# Patient Record
Sex: Male | Born: 1975 | Race: White | Hispanic: No | Marital: Single | Smoking: Never smoker
Health system: Southern US, Community
[De-identification: ages and names within clinical notes are randomized; demographics above are authoritative.]

## PROBLEM LIST (undated history)

## (undated) DIAGNOSIS — K7460 Unspecified cirrhosis of liver: Secondary | ICD-10-CM

## (undated) DIAGNOSIS — Z9889 Other specified postprocedural states: Secondary | ICD-10-CM

## (undated) DIAGNOSIS — I10 Essential (primary) hypertension: Secondary | ICD-10-CM

## (undated) DIAGNOSIS — N182 Chronic kidney disease, stage 2 (mild): Secondary | ICD-10-CM

## (undated) DIAGNOSIS — R519 Headache, unspecified: Secondary | ICD-10-CM

## (undated) DIAGNOSIS — G4733 Obstructive sleep apnea (adult) (pediatric): Secondary | ICD-10-CM

## (undated) DIAGNOSIS — K219 Gastro-esophageal reflux disease without esophagitis: Secondary | ICD-10-CM

## (undated) DIAGNOSIS — G629 Polyneuropathy, unspecified: Secondary | ICD-10-CM

## (undated) HISTORY — DX: Chronic kidney disease, stage 2 (mild): N18.2

## (undated) HISTORY — DX: Other specified postprocedural states: Z98.890

## (undated) HISTORY — DX: Obstructive sleep apnea (adult) (pediatric): G47.33

---

## 2019-08-17 ENCOUNTER — Encounter: Payer: Self-pay | Admitting: Nurse Practitioner

## 2019-08-21 ENCOUNTER — Encounter: Payer: Self-pay | Admitting: Internal Medicine

## 2019-08-25 ENCOUNTER — Other Ambulatory Visit: Payer: Self-pay

## 2019-08-25 ENCOUNTER — Emergency Department (HOSPITAL_COMMUNITY)
Admission: EM | Admit: 2019-08-25 | Discharge: 2019-08-25 | Disposition: A | Discharge status: Home / Self Care | Payer: Medicaid Other | Attending: Emergency Medicine | Admitting: Emergency Medicine

## 2019-08-25 ENCOUNTER — Encounter (HOSPITAL_COMMUNITY): Payer: Self-pay | Admitting: Emergency Medicine

## 2019-08-25 ENCOUNTER — Emergency Department (HOSPITAL_COMMUNITY): Payer: Medicaid Other

## 2019-08-25 DIAGNOSIS — R609 Edema, unspecified: Secondary | ICD-10-CM

## 2019-08-25 DIAGNOSIS — R140 Abdominal distension (gaseous): Secondary | ICD-10-CM | POA: Diagnosis present

## 2019-08-25 DIAGNOSIS — R188 Other ascites: Secondary | ICD-10-CM | POA: Diagnosis not present

## 2019-08-25 DIAGNOSIS — U071 COVID-19: Secondary | ICD-10-CM | POA: Diagnosis not present

## 2019-08-25 DIAGNOSIS — I10 Essential (primary) hypertension: Secondary | ICD-10-CM | POA: Diagnosis not present

## 2019-08-25 DIAGNOSIS — R2243 Localized swelling, mass and lump, lower limb, bilateral: Secondary | ICD-10-CM | POA: Insufficient documentation

## 2019-08-25 DIAGNOSIS — Z79899 Other long term (current) drug therapy: Secondary | ICD-10-CM | POA: Insufficient documentation

## 2019-08-25 HISTORY — DX: Essential (primary) hypertension: I10

## 2019-08-25 HISTORY — DX: Polyneuropathy, unspecified: G62.9

## 2019-08-25 LAB — BRAIN NATRIURETIC PEPTIDE: B Natriuretic Peptide: 44 pg/mL (ref 0.0–100.0)

## 2019-08-25 LAB — PROTIME-INR
INR: 1.2 (ref 0.8–1.2)
Prothrombin Time: 15.3 seconds — ABNORMAL HIGH (ref 11.4–15.2)

## 2019-08-25 LAB — URINALYSIS, ROUTINE W REFLEX MICROSCOPIC
Bilirubin Urine: NEGATIVE
Glucose, UA: NEGATIVE mg/dL
Hgb urine dipstick: NEGATIVE
Ketones, ur: NEGATIVE mg/dL
Leukocytes,Ua: NEGATIVE
Nitrite: NEGATIVE
Protein, ur: NEGATIVE mg/dL
Specific Gravity, Urine: 1.013 (ref 1.005–1.030)
pH: 5 (ref 5.0–8.0)

## 2019-08-25 LAB — CBC WITH DIFFERENTIAL/PLATELET
Abs Immature Granulocytes: 0.01 10*3/uL (ref 0.00–0.07)
Basophils Absolute: 0 10*3/uL (ref 0.0–0.1)
Basophils Relative: 0 %
Eosinophils Absolute: 0.1 10*3/uL (ref 0.0–0.5)
Eosinophils Relative: 1 %
HCT: 38.4 % — ABNORMAL LOW (ref 39.0–52.0)
Hemoglobin: 12.4 g/dL — ABNORMAL LOW (ref 13.0–17.0)
Immature Granulocytes: 0 %
Lymphocytes Relative: 9 %
Lymphs Abs: 0.6 10*3/uL — ABNORMAL LOW (ref 0.7–4.0)
MCH: 29.7 pg (ref 26.0–34.0)
MCHC: 32.3 g/dL (ref 30.0–36.0)
MCV: 91.9 fL (ref 80.0–100.0)
Monocytes Absolute: 0.5 10*3/uL (ref 0.1–1.0)
Monocytes Relative: 7 %
Neutro Abs: 6.1 10*3/uL (ref 1.7–7.7)
Neutrophils Relative %: 83 %
Platelets: 185 10*3/uL (ref 150–400)
RBC: 4.18 MIL/uL — ABNORMAL LOW (ref 4.22–5.81)
RDW: 13.1 % (ref 11.5–15.5)
WBC: 7.3 10*3/uL (ref 4.0–10.5)
nRBC: 0 % (ref 0.0–0.2)

## 2019-08-25 LAB — RESPIRATORY PANEL BY RT PCR (FLU A&B, COVID)
Influenza A by PCR: NEGATIVE
Influenza B by PCR: NEGATIVE
SARS Coronavirus 2 by RT PCR: POSITIVE — AB

## 2019-08-25 LAB — COMPREHENSIVE METABOLIC PANEL
ALT: 19 U/L (ref 0–44)
AST: 27 U/L (ref 15–41)
Albumin: 2.7 g/dL — ABNORMAL LOW (ref 3.5–5.0)
Alkaline Phosphatase: 89 U/L (ref 38–126)
Anion gap: 9 (ref 5–15)
BUN: 18 mg/dL (ref 6–20)
CO2: 24 mmol/L (ref 22–32)
Calcium: 8.1 mg/dL — ABNORMAL LOW (ref 8.9–10.3)
Chloride: 103 mmol/L (ref 98–111)
Creatinine, Ser: 0.84 mg/dL (ref 0.61–1.24)
GFR calc Af Amer: >60 mL/min (ref >60)
GFR calc non Af Amer: >60 mL/min (ref >60)
Glucose, Bld: 90 mg/dL (ref 70–99)
Potassium: 3.4 mmol/L — ABNORMAL LOW (ref 3.5–5.1)
Sodium: 136 mmol/L (ref 135–145)
Total Bilirubin: 0.7 mg/dL (ref 0.3–1.2)
Total Protein: 7.5 g/dL (ref 6.5–8.1)

## 2019-08-25 LAB — TROPONIN I (HIGH SENSITIVITY)
Troponin I (High Sensitivity): <2 ng/L (ref <18)
Troponin I (High Sensitivity): 2 ng/L (ref <18)

## 2019-08-25 NOTE — ED Provider Notes (Signed)
Emergency Department Provider Note   I have reviewed the triage vital signs and the nursing notes.   HISTORY  Chief Complaint Leg Swelling   HPI Myson Levi is a 44 y.o. male with PMH of HTN, Neuropathy, and chronic LE edema with question of newly diagnosed liver cirrhosis presents to the ED worsening edema and abdominal distension.  Patient has had his Lasix increased to 40 mg 3 times daily over the past week with no significant improvement.  He feels swelling both in his legs and in his abdomen.  He feels short of breath mainly with ambulation.  He is able to walk to his mailbox and back but is winded.  He denies chest pain with activity or at rest.  He is not experiencing fevers or chills.  He was at [facility] ED approximately 2 weeks ago and had a work-up including chest x-ray and CT scan of the abdomen and pelvis.  He is unsure of the exact results but believes he was told something about cirrhosis of the liver.  He does not drink alcohol or use drugs.  He was apparently referred to a gastroenterologist by his PCP but has not heard back regarding scheduling an appointment. No fever or chills. Denies abdominal pain.    Past Medical History:  Diagnosis Date  . Hypertension   . Neuropathy     There are no problems to display for this patient.   History reviewed. No pertinent surgical history.  Allergies Patient has no allergy information on record.  History reviewed. No pertinent family history.  Social History Social History   Tobacco Use  . Smoking status: Never Smoker  . Smokeless tobacco: Never Used  Substance Use Topics  . Alcohol use: Never  . Drug use: Never    Review of Systems  Constitutional: No fever/chills Eyes: No visual changes. ENT: No sore throat. Cardiovascular: Denies chest pain. Respiratory: Positive shortness of breath with exertion.  Gastrointestinal: Denies abdominal pain.  No nausea, no vomiting.  No diarrhea.  No constipation.  Genitourinary: Negative for dysuria. Musculoskeletal: Negative for back pain. Positive bilateral LE swelling.  Skin: Negative for rash. Neurological: Negative for headaches, focal weakness or numbness.  10-point ROS otherwise negative.  ____________________________________________   PHYSICAL EXAM:  VITAL SIGNS: ED Triage Vitals [08/25/19 1147]  Enc Vitals Group     BP (!) 145/106     Pulse Rate 88     Resp 18     Temp 98 F (36.7 C)     Temp Source Oral     SpO2 98 %     Weight 295 lb (133.8 kg)     Height 5' 8"  (1.727 m)   Constitutional: Alert and oriented. Well appearing and in no acute distress. Eyes: Conjunctivae are normal.  Head: Atraumatic. Nose: No congestion/rhinnorhea. Mouth/Throat: Mucous membranes are moist.   Neck: No stridor.  Cardiovascular: Normal rate, regular rhythm. Good peripheral circulation. Grossly normal heart sounds.   Respiratory: Normal respiratory effort.  No retractions. Lungs CTAB. Gastrointestinal: Soft and nontender. Positive distention with fluid wave.  Musculoskeletal: No lower extremity tenderness but 3+ pitting edema noted.  Neurologic:  Normal speech and language. No gross focal neurologic deficits are appreciated.  Skin:  Skin is warm, dry and intact. No rash noted.  ____________________________________________   LABS (all labs ordered are listed, but only abnormal results are displayed)  Labs Reviewed  COMPREHENSIVE METABOLIC PANEL - Abnormal; Notable for the following components:      Result Value  Potassium 3.4 (*)    Calcium 8.1 (*)    Albumin 2.7 (*)    All other components within normal limits  CBC WITH DIFFERENTIAL/PLATELET - Abnormal; Notable for the following components:   RBC 4.18 (*)    Hemoglobin 12.4 (*)    HCT 38.4 (*)    Lymphs Abs 0.6 (*)    All other components within normal limits  PROTIME-INR - Abnormal; Notable for the following components:   Prothrombin Time 15.3 (*)    All other components within  normal limits  RESPIRATORY PANEL BY RT PCR (FLU A&B, COVID)  BRAIN NATRIURETIC PEPTIDE  URINALYSIS, ROUTINE W REFLEX MICROSCOPIC  TROPONIN I (HIGH SENSITIVITY)  TROPONIN I (HIGH SENSITIVITY)   ____________________________________________  EKG   EKG Interpretation  Date/Time:  Monday August 25 2019 12:51:15 EDT Ventricular Rate:  74 PR Interval:    QRS Duration: 98 QT Interval:  457 QTC Calculation: 508 R Axis:   44 Text Interpretation: Sinus rhythm Ventricular premature complex Short PR interval Low voltage, precordial leads Left ventricular hypertrophy Borderline T abnormalities, anterior leads Prolonged QT interval No STEMI Confirmed by Nanda Quinton [phone]) on 08/25/2019 12:55:22 PM       ____________________________________________  RADIOLOGY  DG Chest Portable 1 View  Result Date: 08/25/2019 CLINICAL DATA:  Shortness of breath. Abdominal swelling and leg swelling. EXAM: PORTABLE CHEST 1 VIEW COMPARISON:  08/17/2019 FINDINGS: The heart size is within normal limits. Slight pulmonary vascular prominence. Minimal atelectasis at the left lung base. No pulmonary edema or visible pleural effusions. No bone abnormality. IMPRESSION: Slight pulmonary vascular prominence. Electronically Signed   By: Lorriane Shire M.D.   On: 08/25/2019 13:12    ____________________________________________   PROCEDURES  Procedure(s) performed:   Procedures  None  ____________________________________________   INITIAL IMPRESSION / ASSESSMENT AND PLAN / ED COURSE  Pertinent labs & imaging results that were available during my care of the patient were reviewed by me and considered in my medical decision making (see chart for details).   Patient presents emergency department with bilateral lower extremity edema along with abdominal distention.  No abdominal pain, fever, concern for spontaneous bacterial peritonitis.  Patient with likely liver cirrhosis based on his evaluation here with large  volume ascites and report of outpatient CT showing this diagnosis.  He follows primarily with the health department and has been referred to gastroenterology but has not had an appointment as of yet.  I am attempting to obtain the results of the CT scan and emergency department visit from [facility] with the patient consent.  We will obtain x-ray of the chest and follow lab work.  Informal bedside ultrasound in the emergency department shows large volume ascites by my exam.   Labs and imaging not consistent with anasarca or significant volume overload. Most of the patient's symptoms seem to be from ascites. No hypoxemia or respiratory distress. I have provided contact info for local GI and PCP has sent referral. Will need GI w/u and outpatient LVP. Discussed with patient and provided plan in writing. Patient feeling well and comfortable with the plan at discharge.  ____________________________________________  FINAL CLINICAL IMPRESSION(S) / ED DIAGNOSES  Final diagnoses:  Other ascites  Peripheral edema    Note:  This document was prepared using Dragon voice recognition software and may include unintentional dictation errors.  Nanda Quinton, MD, [facility] Emergency Medicine    , [facility], MD 08/25/19 [phone]

## 2019-08-25 NOTE — ED Triage Notes (Signed)
Pt reports abdominal swelling, leg swelling. Pt family member reports pt is currently on three fluid pills but reports recent weight gain, back pain.

## 2019-08-25 NOTE — Discharge Instructions (Signed)
You were seen in the emergency room today with abdominal ascites.  This is a fluid collection in your abdomen likely related to whatever is going on with your liver.  Your primary care doctor has placed a referral to gastroenterology.  I have listed the 2 area practices here in [place].  Please call today to see if they have received a referral and can schedule the next available appointment.  Ultimately, this fluid will likely need to be drained from your abdomen.  Either your primary care doctor or gastroenterologist can help with scheduling what is called a (Large Volume Paracentesis).  Because you have not had this done before they would likely want to send off some of the fluid for testing.  If you develop worsening shortness of breath, chest pain, fever, or abdominal pain you should return to the emergency department immediately for evaluation.

## 2019-08-26 ENCOUNTER — Telehealth (HOSPITAL_COMMUNITY): Payer: Self-pay | Admitting: Nurse Practitioner

## 2019-08-26 DIAGNOSIS — U071 COVID-19: Secondary | ICD-10-CM

## 2019-08-26 NOTE — ED Notes (Signed)
Notified pt of positive covid result.  Instructed to quarantine minimum of 10 days and symptom  Improvement.  Instructed to return to ER as needed.

## 2019-08-26 NOTE — Telephone Encounter (Signed)
Called to discuss with Alejandro Porter about Covid symptoms and the use of bamlanivimab, a monoclonal antibody infusion for those with mild to moderate Covid symptoms and at a high risk of hospitalization.     Pt is qualified for this infusion at the [facility] infusion center due to co-morbid conditions and/or a member of an at-risk group, however declines infusion at this time. Symptoms tier reviewed as well as criteria for ending isolation.  Symptoms reviewed that would warrant ED/Hospital evaluation. Preventative practices reviewed. Patient verbalized understanding. Patient advised to call back if he decides that he does want to get infusion. Callback number to the infusion center given. Patient advised to go to Urgent care or ED with severe symptoms. Last date he would be eligible for infusion is 09/01/19. Symptomatic of shortness of breath.   There are no problems to display for this patient. BMI 44.85 kg/m2  Alejandro Rutter, DNP, [facility] [email or web address] [phone] ([place])

## 2019-09-03 ENCOUNTER — Ambulatory Visit: Payer: Self-pay | Admitting: Nurse Practitioner

## 2019-09-10 ENCOUNTER — Encounter: Payer: Self-pay | Admitting: Nurse Practitioner

## 2019-09-10 ENCOUNTER — Inpatient Hospital Stay (HOSPITAL_COMMUNITY)
Admission: EM | Admit: 2019-09-10 | Discharge: 2019-09-22 | DRG: 441 | Disposition: A | Discharge status: Home / Self Care | Payer: Medicaid Other | Source: Ambulatory Visit | Attending: Family Medicine | Admitting: Family Medicine

## 2019-09-10 ENCOUNTER — Inpatient Hospital Stay (HOSPITAL_COMMUNITY): Payer: Medicaid Other

## 2019-09-10 ENCOUNTER — Emergency Department (HOSPITAL_COMMUNITY): Payer: Medicaid Other

## 2019-09-10 ENCOUNTER — Other Ambulatory Visit: Payer: Self-pay

## 2019-09-10 ENCOUNTER — Ambulatory Visit (INDEPENDENT_AMBULATORY_CARE_PROVIDER_SITE_OTHER): Payer: Medicaid Other | Admitting: Nurse Practitioner

## 2019-09-10 ENCOUNTER — Encounter (HOSPITAL_COMMUNITY): Payer: Self-pay

## 2019-09-10 DIAGNOSIS — K317 Polyp of stomach and duodenum: Secondary | ICD-10-CM | POA: Diagnosis present

## 2019-09-10 DIAGNOSIS — K644 Residual hemorrhoidal skin tags: Secondary | ICD-10-CM | POA: Diagnosis present

## 2019-09-10 DIAGNOSIS — K449 Diaphragmatic hernia without obstruction or gangrene: Secondary | ICD-10-CM | POA: Diagnosis present

## 2019-09-10 DIAGNOSIS — Z6841 Body Mass Index (BMI) 40.0 and over, adult: Secondary | ICD-10-CM | POA: Diagnosis not present

## 2019-09-10 DIAGNOSIS — R601 Generalized edema: Secondary | ICD-10-CM | POA: Insufficient documentation

## 2019-09-10 DIAGNOSIS — E8770 Fluid overload, unspecified: Secondary | ICD-10-CM | POA: Diagnosis present

## 2019-09-10 DIAGNOSIS — I10 Essential (primary) hypertension: Secondary | ICD-10-CM | POA: Diagnosis present

## 2019-09-10 DIAGNOSIS — K2100 Gastro-esophageal reflux disease with esophagitis, without bleeding: Secondary | ICD-10-CM | POA: Diagnosis present

## 2019-09-10 DIAGNOSIS — K7581 Nonalcoholic steatohepatitis (NASH): Principal | ICD-10-CM | POA: Diagnosis present

## 2019-09-10 DIAGNOSIS — Z8616 Personal history of COVID-19: Secondary | ICD-10-CM

## 2019-09-10 DIAGNOSIS — K621 Rectal polyp: Secondary | ICD-10-CM | POA: Diagnosis not present

## 2019-09-10 DIAGNOSIS — I8511 Secondary esophageal varices with bleeding: Secondary | ICD-10-CM | POA: Diagnosis present

## 2019-09-10 DIAGNOSIS — D638 Anemia in other chronic diseases classified elsewhere: Secondary | ICD-10-CM | POA: Diagnosis present

## 2019-09-10 DIAGNOSIS — K7460 Unspecified cirrhosis of liver: Secondary | ICD-10-CM

## 2019-09-10 DIAGNOSIS — K3189 Other diseases of stomach and duodenum: Secondary | ICD-10-CM | POA: Diagnosis present

## 2019-09-10 DIAGNOSIS — R64 Cachexia: Secondary | ICD-10-CM | POA: Diagnosis present

## 2019-09-10 DIAGNOSIS — D696 Thrombocytopenia, unspecified: Secondary | ICD-10-CM | POA: Diagnosis present

## 2019-09-10 DIAGNOSIS — K648 Other hemorrhoids: Secondary | ICD-10-CM | POA: Diagnosis present

## 2019-09-10 DIAGNOSIS — R188 Other ascites: Secondary | ICD-10-CM | POA: Diagnosis present

## 2019-09-10 DIAGNOSIS — K2210 Ulcer of esophagus without bleeding: Secondary | ICD-10-CM | POA: Diagnosis present

## 2019-09-10 DIAGNOSIS — E669 Obesity, unspecified: Secondary | ICD-10-CM | POA: Diagnosis present

## 2019-09-10 DIAGNOSIS — K259 Gastric ulcer, unspecified as acute or chronic, without hemorrhage or perforation: Secondary | ICD-10-CM | POA: Diagnosis present

## 2019-09-10 DIAGNOSIS — K5730 Diverticulosis of large intestine without perforation or abscess without bleeding: Secondary | ICD-10-CM | POA: Diagnosis present

## 2019-09-10 DIAGNOSIS — I81 Portal vein thrombosis: Secondary | ICD-10-CM

## 2019-09-10 DIAGNOSIS — R609 Edema, unspecified: Secondary | ICD-10-CM | POA: Insufficient documentation

## 2019-09-10 DIAGNOSIS — R0600 Dyspnea, unspecified: Secondary | ICD-10-CM | POA: Diagnosis not present

## 2019-09-10 DIAGNOSIS — K7200 Acute and subacute hepatic failure without coma: Secondary | ICD-10-CM | POA: Diagnosis not present

## 2019-09-10 DIAGNOSIS — K222 Esophageal obstruction: Secondary | ICD-10-CM | POA: Diagnosis present

## 2019-09-10 DIAGNOSIS — Z20822 Contact with and (suspected) exposure to covid-19: Secondary | ICD-10-CM | POA: Diagnosis present

## 2019-09-10 DIAGNOSIS — K635 Polyp of colon: Secondary | ICD-10-CM | POA: Diagnosis not present

## 2019-09-10 DIAGNOSIS — G629 Polyneuropathy, unspecified: Secondary | ICD-10-CM | POA: Diagnosis present

## 2019-09-10 DIAGNOSIS — K766 Portal hypertension: Secondary | ICD-10-CM | POA: Insufficient documentation

## 2019-09-10 DIAGNOSIS — Z79899 Other long term (current) drug therapy: Secondary | ICD-10-CM

## 2019-09-10 HISTORY — DX: Unspecified cirrhosis of liver: K74.60

## 2019-09-10 LAB — HEPATITIS PANEL, ACUTE
HCV Ab: NONREACTIVE
Hep A IgM: NONREACTIVE
Hep B C IgM: NONREACTIVE
Hepatitis B Surface Ag: NONREACTIVE

## 2019-09-10 LAB — HEPATITIS B SURFACE ANTIGEN: Hepatitis B Surface Ag: NONREACTIVE

## 2019-09-10 LAB — COMPREHENSIVE METABOLIC PANEL
ALT: 17 U/L (ref 0–44)
AST: 23 U/L (ref 15–41)
Albumin: 2.6 g/dL — ABNORMAL LOW (ref 3.5–5.0)
Alkaline Phosphatase: 80 U/L (ref 38–126)
Anion gap: 8 (ref 5–15)
BUN: 13 mg/dL (ref 6–20)
CO2: 27 mmol/L (ref 22–32)
Calcium: 8.3 mg/dL — ABNORMAL LOW (ref 8.9–10.3)
Chloride: 102 mmol/L (ref 98–111)
Creatinine, Ser: 0.72 mg/dL (ref 0.61–1.24)
GFR calc Af Amer: >60 mL/min (ref >60)
GFR calc non Af Amer: >60 mL/min (ref >60)
Glucose, Bld: 103 mg/dL — ABNORMAL HIGH (ref 70–99)
Potassium: 3.6 mmol/L (ref 3.5–5.1)
Sodium: 137 mmol/L (ref 135–145)
Total Bilirubin: 0.7 mg/dL (ref 0.3–1.2)
Total Protein: 7.4 g/dL (ref 6.5–8.1)

## 2019-09-10 LAB — LACTATE DEHYDROGENASE, PLEURAL OR PERITONEAL FLUID: LD, Fluid: 71 U/L — ABNORMAL HIGH (ref 3–23)

## 2019-09-10 LAB — URINALYSIS, COMPLETE (UACMP) WITH MICROSCOPIC
Bacteria, UA: NONE SEEN
Bilirubin Urine: NEGATIVE
Glucose, UA: NEGATIVE mg/dL
Hgb urine dipstick: NEGATIVE
Ketones, ur: NEGATIVE mg/dL
Leukocytes,Ua: NEGATIVE
Nitrite: NEGATIVE
Protein, ur: NEGATIVE mg/dL
Specific Gravity, Urine: 1.012 (ref 1.005–1.030)
pH: 9 — ABNORMAL HIGH (ref 5.0–8.0)

## 2019-09-10 LAB — PROTEIN / CREATININE RATIO, URINE
Creatinine, Urine: 82.66 mg/dL
Protein Creatinine Ratio: 0.13 mg/mg{Cre} (ref 0.00–0.15)
Total Protein, Urine: 11 mg/dL

## 2019-09-10 LAB — ECHOCARDIOGRAM COMPLETE
Height: 69 in
Weight: 5040 oz

## 2019-09-10 LAB — SARS CORONAVIRUS 2 (TAT 6-24 HRS): SARS Coronavirus 2: NEGATIVE

## 2019-09-10 LAB — CBC WITH DIFFERENTIAL/PLATELET
Abs Immature Granulocytes: 0.02 10*3/uL (ref 0.00–0.07)
Basophils Absolute: 0 10*3/uL (ref 0.0–0.1)
Basophils Relative: 0 %
Eosinophils Absolute: 0.2 10*3/uL (ref 0.0–0.5)
Eosinophils Relative: 3 %
HCT: 37.5 % — ABNORMAL LOW (ref 39.0–52.0)
Hemoglobin: 12.1 g/dL — ABNORMAL LOW (ref 13.0–17.0)
Immature Granulocytes: 0 %
Lymphocytes Relative: 8 %
Lymphs Abs: 0.6 10*3/uL — ABNORMAL LOW (ref 0.7–4.0)
MCH: 29.7 pg (ref 26.0–34.0)
MCHC: 32.3 g/dL (ref 30.0–36.0)
MCV: 92.1 fL (ref 80.0–100.0)
Monocytes Absolute: 0.7 10*3/uL (ref 0.1–1.0)
Monocytes Relative: 10 %
Neutro Abs: 5.3 10*3/uL (ref 1.7–7.7)
Neutrophils Relative %: 79 %
Platelets: 111 10*3/uL — ABNORMAL LOW (ref 150–400)
RBC: 4.07 MIL/uL — ABNORMAL LOW (ref 4.22–5.81)
RDW: 14.2 % (ref 11.5–15.5)
WBC: 6.7 10*3/uL (ref 4.0–10.5)
nRBC: 0 % (ref 0.0–0.2)

## 2019-09-10 LAB — BODY FLUID CELL COUNT WITH DIFFERENTIAL
Lymphs, Fluid: 69 %
Monocyte-Macrophage-Serous Fluid: 29 % — ABNORMAL LOW (ref 50–90)
Neutrophil Count, Fluid: 2 % (ref 0–25)
Total Nucleated Cell Count, Fluid: 215 cu mm (ref 0–1000)

## 2019-09-10 LAB — PROTIME-INR
INR: 1.2 (ref 0.8–1.2)
INR: 1.2 (ref 0.8–1.2)
Prothrombin Time: 15.1 seconds (ref 11.4–15.2)
Prothrombin Time: 15.2 seconds (ref 11.4–15.2)

## 2019-09-10 LAB — GLUCOSE, PLEURAL OR PERITONEAL FLUID: Glucose, Fluid: 116 mg/dL

## 2019-09-10 LAB — RAPID URINE DRUG SCREEN, HOSP PERFORMED
Amphetamines: NOT DETECTED
Barbiturates: NOT DETECTED
Benzodiazepines: NOT DETECTED
Cocaine: NOT DETECTED
Opiates: NOT DETECTED
Tetrahydrocannabinol: NOT DETECTED

## 2019-09-10 LAB — PROTEIN, PLEURAL OR PERITONEAL FLUID: Total protein, fluid: <3 g/dL

## 2019-09-10 LAB — HIV ANTIBODY (ROUTINE TESTING W REFLEX): HIV Screen 4th Generation wRfx: NONREACTIVE

## 2019-09-10 LAB — AMMONIA: Ammonia: 27 umol/L (ref 9–35)

## 2019-09-10 LAB — GRAM STAIN: Gram Stain: NONE SEEN

## 2019-09-10 LAB — HEPATITIS C ANTIBODY: HCV Ab: NONREACTIVE

## 2019-09-10 LAB — ALBUMIN, PLEURAL OR PERITONEAL FLUID: Albumin, Fluid: 0.9 g/dL

## 2019-09-10 LAB — FERRITIN: Ferritin: 193 ng/mL (ref 24–336)

## 2019-09-10 LAB — ACETAMINOPHEN LEVEL: Acetaminophen (Tylenol), Serum: <10 ug/mL — ABNORMAL LOW (ref 10–30)

## 2019-09-10 MED ORDER — LIDOCAINE HCL (PF) 1 % IJ SOLN
5.0000 mL | Freq: Once | INTRAMUSCULAR | Status: DC
  Filled 2019-09-10: qty 30

## 2019-09-10 MED ORDER — FERROUS SULFATE 325 (65 FE) MG PO TABS
325.0000 mg | ORAL_TABLET | Freq: Every day | ORAL | Status: DC
  Administered 2019-09-10: 325 mg via ORAL
  Filled 2019-09-10 (×2): qty 1

## 2019-09-10 MED ORDER — FUROSEMIDE 10 MG/ML IJ SOLN
40.0000 mg | INTRAMUSCULAR | Status: AC
  Administered 2019-09-10: 40 mg via INTRAVENOUS
  Filled 2019-09-10: qty 4

## 2019-09-10 MED ORDER — METOPROLOL SUCCINATE ER 50 MG PO TB24
50.0000 mg | ORAL_TABLET | Freq: Every day | ORAL | Status: DC
  Administered 2019-09-10 – 2019-09-12 (×3): 50 mg via ORAL
  Filled 2019-09-10 (×4): qty 1

## 2019-09-10 MED ORDER — GABAPENTIN 300 MG PO CAPS
300.0000 mg | ORAL_CAPSULE | Freq: Every day | ORAL | Status: DC
  Administered 2019-09-10 – 2019-09-21 (×12): 300 mg via ORAL
  Filled 2019-09-10 (×12): qty 1

## 2019-09-10 MED ORDER — ALBUMIN HUMAN 25 % IV SOLN
50.0000 g | Freq: Once | INTRAVENOUS | Status: AC
  Administered 2019-09-10: 50 g via INTRAVENOUS
  Filled 2019-09-10: qty 200

## 2019-09-10 MED ORDER — FUROSEMIDE 10 MG/ML IJ SOLN
60.0000 mg | Freq: Two times a day (BID) | INTRAMUSCULAR | Status: DC
  Administered 2019-09-11: 60 mg via INTRAVENOUS
  Filled 2019-09-10: qty 6

## 2019-09-10 NOTE — Patient Instructions (Signed)
Your health issues we discussed today were:   Cirrhosis with a lot of swelling: 1. I am sorry you are not doing well! 2. I Alejandro Porter have you proceed to the emergency department for likely admission to the hospital.  I have called the emergency department and notified them that I am sending you 3. While you were there we can hopefully get your fluid under control, get some of the swelling up through your hips improved, and complete labs and imaging to try to work on why you have developed cirrhosis 4. Further recommendations will follow 5. Our practice will see you while you are in the hospital  Overall I recommend:  1. Continue your other current medications 2. Return for follow-up based on recommendations made at the hospital 3. Call us if you have any questions or concerns   ---------------------------------------------------------------  COVID-19 Vaccine Information can be found at: [email or web address].uk For questions related to vaccine distribution or appointments, please email [email or web address] or call [phone].   ---------------------------------------------------------------   At [facility] we value your feedback. You may receive a survey about your visit today. Please share your experience as we strive to create trusting relationships with our patients to provide genuine, compassionate, quality care.  We appreciate your understanding and patience as we review any laboratory studies, imaging, and other diagnostic tests that are ordered as we care for you. Our office policy is 5 business days for review of these results, and any emergent or urgent results are addressed in a timely manner for your best interest. If you do not hear from our office in 1 week, please contact us.   We also encourage the use of MyChart, which contains your medical information for your review as well. If you are not enrolled in  this feature, an access code is on this after visit summary for your convenience. Thank you for allowing [place] to be involved in your care.  It was great to see you today!  I hope you have a great Summer!!

## 2019-09-10 NOTE — Progress Notes (Signed)
*  PRELIMINARY RESULTS* Echocardiogram 2D Echocardiogram has been performed.  Leavy Cella 09/10/2019, 4:38 PM

## 2019-09-10 NOTE — Assessment & Plan Note (Signed)
Newly diagnosed with cirrhosis on CT imaging at [facility] about 3 weeks ago.  Imaging as per HPI but essentially found cirrhosis with portal hypertension and large amount of ascites.  He has never been diagnosed with cirrhosis before.  Query etiology.  He denies drug use, any alcohol.  He does occasionally/intermittently take somewhat large doses of Tylenol up to "6 or 7 pills a day" (unknown strength 250 mg versus 500 mg).  Drug-induced injury is a possibility.  About 4 weeks ago he began having significant swelling in his lower extremities and across his abdomen that is gotten progressively worse.  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  We will plan to work-up his liver for possible causes including standard CBC, BMP, HFP, INR, AFP, ferritin, ceruloplasmin, alpha-1 antitrypsin, ANA, AMA, ASMA.  Further recommendations for ascites as per below.  Given his newly diagnosed cirrhosis, severe abdominal distention and tense ascites, pitting edema up to his hips bilaterally with likely anasarca I have referred him to the emergency department for admission and diuresis.  I have contacted ultrasound to discuss need for paracentesis.  Follow-up based on post hospital recommendations.

## 2019-09-10 NOTE — Assessment & Plan Note (Signed)
Likely portal hypertension based on splenomegaly and ascites on CT imaging.  His platelet count does remain normal, although lower end of normal.  He will likely need endoscopic screening for esophageal varices at some point in near future.  Denies any nausea or vomiting at this point, no hematochezia or melena.  Continue to monitor.  Follow-up based on post hospital recommendations.

## 2019-09-10 NOTE — ED Provider Notes (Signed)
[facility] Provider Note   CSN: [id number] Arrival date & time: 09/10/19  4008     History Chief Complaint  Patient presents with  . Ascites    Alejandro Porter is a 44 y.o. male.  HPI   This patient is a 44 year old male, history of hypertension, history of recently diagnosed cirrhosis, states he has never had any alcohol, he does not smoke cigarettes, he has never had hepatitis to the best of his knowledge.  He was recently diagnosed with cirrhosis, he was seen at an outside hospital and started on some diuretic medications however over the last month he has had progressive swelling and when he followed up with gastroenterology today he was severely edematous with a massively distended abdomen, tachypnea and massive bilateral lower extremity edema.  This patient reports that he can no longer lay down to sleep, he sleeps in a recliner sitting up, he was sent over from the GI office for paracentesis and diuresis.  This is persistent, severe, worsening over time  Past Medical History:  Diagnosis Date  . Hypertension   . Liver cirrhosis ([place])   . Neuropathy     Patient Active Problem List   Diagnosis Date Noted  . Hepatic cirrhosis ([place]) 09/10/2019  . Edema 09/10/2019  . Portal hypertension ([place]) 09/10/2019    History reviewed. No pertinent surgical history.     No family history on file.  Social History   Tobacco Use  . Smoking status: Never Smoker  . Smokeless tobacco: Never Used  Substance Use Topics  . Alcohol use: Never  . Drug use: Never    Home Medications Prior to Admission medications   Medication Sig Start Date End Date Taking? Authorizing Provider  Cholecalciferol (VITAMIN D3) 125 MCG (5000 UT) TABS Take 1 tablet by mouth daily. 06/17/19   [provider]  Cholecalciferol 1.25 MG (50000 UT) TABS Take 1 tablet by mouth daily.  07/31/19   [provider]  Ferrous Sulfate (IRON) 325 (65 Fe) MG TABS Take 1 tablet by mouth  daily.    [provider]  furosemide (LASIX) 40 MG tablet Take 40 mg by mouth 2 (two) times daily as needed for fluid.    [provider]  gabapentin (NEURONTIN) 300 MG capsule Take 300 mg by mouth at bedtime.  08/19/19   [provider]  metoprolol succinate (TOPROL-XL) 50 MG 24 hr tablet Take 50 mg by mouth daily. Take with or immediately following a meal.    [provider]  Multiple Vitamin (MULTIVITAMIN) tablet Take 1 tablet by mouth daily.    [provider]  Potassium 99 MG TABS Take 1 tablet by mouth daily.    [provider]    Allergies    Patient has no allergy information on record.  Review of Systems   Review of Systems  All other systems reviewed and are negative.   Physical Exam Updated Vital Signs Temp 98.8 F (37.1 C) (Oral)   Ht 1.753 m (5' 9" )   Wt (!) 142.9 kg   BMI 46.52 kg/m   Physical Exam Vitals and nursing note reviewed.  Constitutional:      General: He is in acute distress.     Appearance: He is well-developed. He is ill-appearing.  HENT:     Head: Normocephalic and atraumatic.     Mouth/Throat:     Pharynx: No oropharyngeal exudate.  Eyes:     General: No scleral icterus.  Right eye: No discharge.        Left eye: No discharge.     Conjunctiva/sclera: Conjunctivae normal.     Pupils: Pupils are equal, round, and reactive to light.  Neck:     Thyroid: No thyromegaly.     Vascular: No JVD.  Cardiovascular:     Rate and Rhythm: Normal rate and regular rhythm.     Heart sounds: Normal heart sounds. No murmur. No friction rub. No gallop.   Pulmonary:     Effort: Respiratory distress present.     Breath sounds: Wheezing and rales present.     Comments: Subtle rales right greater than left at the bases, tachypneic, audible expiratory wheezing Abdominal:     General: Bowel sounds are normal. There is distension.     Palpations: Abdomen is soft. There is no mass.     Tenderness: There is  no abdominal tenderness.     Comments: Massively distended edematous anasarca abdomen, fluid wave present, no tabetic sounds to percussion  Musculoskeletal:        General: No tenderness. Normal range of motion.     Cervical back: Normal range of motion and neck supple.     Right lower leg: Edema present.     Left lower leg: Edema present.     Comments: 3-4+ pitting edema bilaterally symmetrical from the feet all the way through the thighs  Lymphadenopathy:     Cervical: No cervical adenopathy.  Skin:    General: Skin is warm and dry.     Findings: No erythema or rash.  Neurological:     Mental Status: He is alert.     Coordination: Coordination normal.  Psychiatric:        Behavior: Behavior normal.     ED Results / Procedures / Treatments   Labs (all labs ordered are listed, but only abnormal results are displayed) Labs Reviewed  SARS CORONAVIRUS 2 (TAT 6-24 HRS)  BODY FLUID CULTURE  COMPREHENSIVE METABOLIC PANEL  AMMONIA  CBC WITH DIFFERENTIAL/PLATELET  PROTIME-INR  LACTATE DEHYDROGENASE, PLEURAL OR PERITONEAL FLUID  GLUCOSE, PLEURAL OR PERITONEAL FLUID  PROTEIN, PLEURAL OR PERITONEAL FLUID  ALBUMIN, PLEURAL OR PERITONEAL FLUID  BODY FLUID CELL COUNT WITH DIFFERENTIAL    EKG EKG Interpretation  Date/Time:  Wednesday September 10 2019 10:18:11 EDT Ventricular Rate:  94 PR Interval:    QRS Duration: 78 QT Interval:  468 QTC Calculation: 586 R Axis:   56 Text Interpretation: Sinus rhythm Low voltage, precordial leads Nonspecific T abnormalities, diffuse leads Prolonged QT interval Nonspecific T wave abnormality since last tracing no significant change Confirmed by Noemi Chapel [phone]) on 09/10/2019 10:48:13 AM   Radiology DG Chest Port 1 View  Result Date: 09/10/2019 CLINICAL DATA:  Shortness of breath. EXAM: PORTABLE CHEST 1 VIEW COMPARISON:  August 25, 2019. FINDINGS: Stable cardiomediastinal silhouette. No pneumothorax is noted. Increased left basilar atelectasis or  infiltrate is noted. Minimal right basilar subsegmental atelectasis or infiltrate is noted. Bony thorax is unremarkable. IMPRESSION: Increased bibasilar subsegmental atelectasis or infiltrates, left greater than right. Electronically Signed   By: Marijo Conception M.D.   On: 09/10/2019 10:14    Procedures .Paracentesis  Date/Time: 09/10/2019 10:47 AM Performed by: Noemi Chapel, MD Authorized by: Noemi Chapel, MD   Consent:    Consent obtained:  Written   Consent given by:  Patient   Risks discussed:  Bleeding, bowel perforation, infection and pain   Alternatives discussed:  No treatment and delayed treatment Pre-procedure details:  Procedure purpose:  Therapeutic   Preparation: Patient was prepped and draped in usual sterile fashion   Anesthesia (see MAR for exact dosages):    Anesthesia method:  Local infiltration   Local anesthetic:  Lidocaine 1% w/o epi Procedure details:    Needle gauge:  18   Ultrasound guidance: yes     Puncture site:  L lower quadrant   Fluid removed amount:  8000   Fluid appearance:  Amber   Dressing:  4x4 sterile gauze and adhesive bandage Post-procedure details:    Patient tolerance of procedure:  Tolerated well, no immediate complications Comments:         (including critical care time)  Medications Ordered in ED Medications  furosemide (LASIX) injection 40 mg (has no administration in time range)  lidocaine (PF) (XYLOCAINE) 1 % injection 5 mL (has no administration in time range)    ED Course  I have reviewed the triage vital signs and the nursing notes.  Pertinent labs & imaging results that were available during my care of the patient were reviewed by me and considered in my medical decision making (see chart for details).    MDM Rules/Calculators/A&P                      This patient appears somewhat chronically ill, he is somewhat cachectic around the face but diffusely edematous with anasarca extending up to the costal  margin.   This patient presents to the ED for concern of swelling and edema as well as ascites, this involves an extensive number of treatment options, and is a complaint that carries with it a high risk of complications and morbidity.  The differential diagnosis includes liver failure, electrolyte disturbance, primary pulmonary process   Lab Tests:   I Ordered, reviewed, and interpreted labs, which included CMP, CBC, ammonia, hepatitis panel  Medicines ordered:   I ordered medication Lasix for edema  Imaging Studies ordered:   I ordered imaging studies which included chest x-ray and  I independently visualized and interpreted imaging which showed atelectasis or infiltrates in the base left greater than right  Additional history obtained:   Additional history obtained from medical record  Previous records obtained and reviewed   Consultations Obtained:   I consulted hospitalist and discussed lab and imaging findings, Dr. Carles Collet will see the patient for admission  Reevaluation:  After the interventions stated above, I reevaluated the patient and found improved  Critical Interventions:  . Paracentesis for massive ascites, Lasix for fluid overload, the patient will need to be admitted to the hospital . Discussed with the hospitalist for evaluation and admission   Final Clinical Impression(s) / ED Diagnoses Final diagnoses:  Acute liver failure without hepatic coma  Cirrhosis of liver with ascites, unspecified hepatic cirrhosis type ([place])  Anasarca  Other ascites      Noemi Chapel, MD 09/10/19 1147

## 2019-09-10 NOTE — Assessment & Plan Note (Signed)
Significant edema bilaterally lower extremities and ascites.  His abdomen is quite distended today with tense ascites.  Associated respiratory distress and significant dyspnea even at rest.  Bilateral lower extremity edema despite Lasix 40 mg twice daily with 3+ pitting edema in the feet, ankles, calves progressing up to 1+ pitting edema all the way to at least his bilateral hips.  Likely significant volume overload.  He did note that he has not been peeing very often, however his normal creatinine in the emergency room 3 weeks ago is reassuring.  Likely chronic cirrhosis previously undiagnosed now tipped into a decompensated state, possibly due to nonocclusive portal venous thrombus.  His weight is up about 50 to 60 pounds in the past months.  Given his respiratory issues, need for paracentesis (and inability to withdraw more than 4 L at his first fluid tap) with need for significant diuresis for significant lower extremity edema bordering on anasarca I will refer him to the emergency department for likely admission.  I have entered outpatient paracentesis orders with labs and notified ultrasound.  This will need to be changed to inpatient but I wanted to ensure labs were ordered with his paracentesis including Gram stain, culture, cytology, cell count, albumin (to calculate SAAG).  Further recommendations to follow including posthospitalization follow-up.

## 2019-09-10 NOTE — Progress Notes (Signed)
Cc'ed to pcp °

## 2019-09-10 NOTE — ED Triage Notes (Addendum)
Pt diagnosed with liver cirrhosis 4 weeks ago. Today he was sent by GI NP to have a paracentesis. Extreme ascites on abdomen as well as bilateral leg swelling. Pt has SOB with rest and exertion. Pt has been taking 73m Lasix daily, but has not taken it today. Pt has gained 65 lbs in the last 4 weeks.

## 2019-09-10 NOTE — H&P (Signed)
History and Physical  Cindy Brindisi AES:[id number] DOB: 19-Jun-1975 DOA: 09/10/2019   PCP: [facility] [place]   Patient coming from: Home  Chief Complaint: swelling  HPI:  Alejandro Porter is a 44 y.o. male with medical history of hypertension and recently diagnosed liver cirrhosis presenting with worsening lower extremity edema and increasing abdominal girth.  The patient states that he has been struggling with leg edema and abdominal edema for at least 2 months.  He had been going to the health department and [facility] and he has been receiving furosemide 40 mg twice daily since February 2021.  Unfortunately, he continued to have dyspnea on exertion and worsening edema.  He initially went to [facility] emergency department.  CT of the abdomen and pelvis was performed at that time it showed cirrhosis with portal hypertension with splenomegaly and a large amount of ascites.  There is nonocclusive thrombus in his portal vein.  In addition there was prominent to mildly enlarged periceliac and gastrohepatic lymph nodes.  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  The patient was instructed to follow-up at the [place].  Unfortunately, he continued to have edema and swelling.  He presented to the emergency department at [facility] on 08/25/2019.  He was told to increase his furosemide to 40 mg 3 times daily.  He continued to have dyspnea on exertion and orthopnea type symptoms to the point where he has had to sleep in the recliner.  He saw gastroenterology on the morning of 09/10/2019.  Because of concerns for decompensated liver cirrhosis and continued edema and shortness of breath, the patient was sent to the emergency department for further evaluation.  The patient himself denies any fevers, chills, chest pain, nausea, vomiting, diarrhea, abdominal pain, dysuria, hematuria.  In the emergency department, the patient was afebrile and  hemodynamically stable with oxygen saturation 96% on room air.  His x-ray showed bibasilar atelectasis, left greater than right.  Assessment/Plan: Decompensated liver cirrhosis -Paracentesis in the emergency department removed 8 L -Start IV furosemide -Give albumin -Patient denies any history of alcohol -Check alpha-fetoprotein, INR, ferritin, ceruloplasmin, alpha-1 antitrypsin, ANA, antimitochondrial antibody, anti-smooth muscle antibody -Hepatitis B surface antigen -Hep C antibody -echo -urine protein/creatinine ratio -GI consult  Essential hypertension -Continue metoprolol succinate  Portal venous Thrombus -defer to GI whether to anticoagulate      Past Medical History:  Diagnosis Date  . Hypertension   . Liver cirrhosis ([place])   . Neuropathy    History reviewed. No pertinent surgical history. Social History:  reports that he has never smoked. He has never used smokeless tobacco. He reports that he does not drink alcohol or use drugs.   Family History  Problem Relation Age of Onset  . Liver disease Neg Hx        Prior to Admission medications   Medication Sig Start Date End Date Taking? Authorizing Provider  Cholecalciferol (VITAMIN D3) 125 MCG (5000 UT) TABS Take 1 tablet by mouth daily. 06/17/19  Yes [provider]  Ferrous Sulfate (IRON) 325 (65 Fe) MG TABS Take 1 tablet by mouth daily.   Yes [provider]  furosemide (LASIX) 40 MG tablet Take 40 mg by mouth 2 (two) times daily as needed for fluid.   Yes [provider]  gabapentin (NEURONTIN) 300 MG capsule Take 300 mg by mouth at bedtime.  08/19/19  Yes [provider]  metoprolol succinate (TOPROL-XL) 50 MG 24  hr tablet Take 50 mg by mouth daily. Take with or immediately following a meal.   Yes [provider]  Multiple Vitamin (MULTIVITAMIN) tablet Take 1 tablet by mouth daily.   Yes [provider]  Potassium 99 MG TABS Take 1 tablet by mouth daily.    Yes [provider]    Review of Systems:  Constitutional:  No weight loss, night sweats, Fevers, chills, fatigue.  Head&Eyes: No headache.  No vision loss.  No eye pain or scotoma ENT:  No Difficulty swallowing,Tooth/dental problems,Sore throat,  No ear ache, post nasal drip,  Cardio-vascular:  No chest pain, Orthopnea, PND, swelling in lower extremities,  dizziness, palpitations  GI:  No  abdominal pain, nausea, vomiting, diarrhea, loss of appetite, hematochezia, melena, heartburn, indigestion, Resp:  No shortness of breath with exertion or at rest. No cough. No coughing up of blood .No wheezing.No chest wall deformity  Skin:  no rash or lesions.  GU:  no dysuria, change in color of urine, no urgency or frequency. No flank pain.  Musculoskeletal:  No decreased range of motion. No back pain.  Psych:  No change in mood or affect. No depression or anxiety. Neurologic: No headache, no dysesthesia, no focal weakness, no vision loss. No syncope  Physical Exam: Vitals:   09/10/19 0949 09/10/19 0952 09/10/19 1100 09/10/19 1200  BP:   (!) 136/91 130/88  Pulse:   91 93  Resp:   19 (!) 27  Temp:  98.8 F (37.1 C)    TempSrc:  Oral    SpO2:   97% 97%  Weight: (!) 142.9 kg     Height: 5' 9"  (1.753 m)      General:  A&O x 3, NAD, nontoxic, pleasant/cooperative Head/Eye: No conjunctival hemorrhage, no icterus, [place]/AT, No nystagmus ENT:  No icterus,  No thrush, good dentition, no pharyngeal exudate Neck:  No masses, no lymphadenpathy, no bruits CV:  RRR, no rub, no gallop, no S3 Lung:  Bibasilar crackles. No wheeze Abdomen: soft/NT, +BS, nondistended, no peritoneal signs Ext: No cyanosis, No rashes, No petechiae, No lymphangitis, 4+LE edema Neuro: CNII-XII intact, strength 4/5 in bilateral upper and lower extremities, no dysmetria  Labs on Admission:  Basic Metabolic Panel: Recent Labs  Lab 09/10/19 1025  NA 137  K 3.6  CL 102  CO2 27  GLUCOSE 103*  BUN 13    CREATININE 0.72  CALCIUM 8.3*   Liver Function Tests: Recent Labs  Lab 09/10/19 1025  AST 23  ALT 17  ALKPHOS 80  BILITOT 0.7  PROT 7.4  ALBUMIN 2.6*   No results for input(s): LIPASE, AMYLASE in the last 168 hours. Recent Labs  Lab 09/10/19 1040  AMMONIA 27   CBC: Recent Labs  Lab 09/10/19 1025  WBC 6.7  NEUTROABS 5.3  HGB 12.1*  HCT 37.5*  MCV 92.1  PLT 111*   Coagulation Profile: Recent Labs  Lab 09/10/19 1025  INR 1.2   Cardiac Enzymes: No results for input(s): CKTOTAL, CKMB, CKMBINDEX, TROPONINI in the last 168 hours. BNP: Invalid input(s): POCBNP CBG: No results for input(s): GLUCAP in the last 168 hours. Urine analysis:    Component Value Date/Time   COLORURINE YELLOW 08/25/2019 [place] 08/25/2019 1451   LABSPEC 1.013 08/25/2019 1451   [place] 5.0 08/25/2019 1451   GLUCOSEU NEGATIVE 08/25/2019 1451   HGBUR NEGATIVE 08/25/2019 1451   BILIRUBINUR NEGATIVE 08/25/2019 1451   [place] 08/25/2019 1451   PROTEINUR NEGATIVE 08/25/2019 1451  NITRITE NEGATIVE 08/25/2019 1451   LEUKOCYTESUR NEGATIVE 08/25/2019 1451   Sepsis Labs: @LABRCNTIP (procalcitonin:4,lacticidven:4) )No results found for this or any previous visit (from the past 240 hour(s)).   Radiological Exams on Admission: DG Chest Port 1 View  Result Date: 09/10/2019 CLINICAL DATA:  Shortness of breath. EXAM: PORTABLE CHEST 1 VIEW COMPARISON:  August 25, 2019. FINDINGS: Stable cardiomediastinal silhouette. No pneumothorax is noted. Increased left basilar atelectasis or infiltrate is noted. Minimal right basilar subsegmental atelectasis or infiltrate is noted. Bony thorax is unremarkable. IMPRESSION: Increased bibasilar subsegmental atelectasis or infiltrates, left greater than right. Electronically Signed   By: Marijo Conception M.D.   On: 09/10/2019 10:14    EKG: Independently reviewed. Sinus, nonspecific T wave changes    Time spent:70 minutes Code Status:    FULL Family Communication:  No Family at bedside Disposition Plan: expect 2-3 day hospitalization Consults called: GI DVT Prophylaxis:   SCDs  Orson Eva, DO  [facility] Pager [phone]  If 7PM-7AM, please contact night-coverage [email or web address] Password [facility] 09/10/2019, 12:09 PM

## 2019-09-10 NOTE — Progress Notes (Signed)
Primary Care Physician:  [facility] Primary Gastroenterologist:  Dr. Gala Romney  Chief Complaint  Patient presents with  . Cirrhosis    ref by PCP  . Edema    abdomen     HPI:   Alejandro Porter is a 44 y.o. male who presents on referral from the health department for cirrhosis.  Reviewed information provided with referral including follow-up COVID-19 test report documenting negative SARS-CoV-2 08/29/2019.  Also reviewed primary care office visit at the [facility] 08/19/2019.  At that time he stated he went to the emergency department at [facility] and was told based on CT scan he has cirrhosis and ascites and needs to see a specialist.  At that time he also noted not urinating very often, noted edema.  Labs at that time include normal white blood cell count 8.2, normal hemoglobin at 13.0, normal platelets at 192.  Creatinine was found to be 0.84.  LFTs normal.  Sodium a little low at 134.  BMP essentially normal, troponins normal.  I was able to pull a copy of his CT result completed at [facility] on 08/17/2019.  Findings include cirrhosis with portal venous hypertension with splenomegaly, large amount of ascites.  Also noted nonocclusive thrombus in the portal vein.  Prominent to mildly enlarged periceliac and gastrohepatic ligament lymph nodes most likely reactive.  Incidentally the patient was in the emergency department 08/25/2019 with a past medical history of hypertension, neuropathy, chronic lower extremity edema and a question of newly diagnosed liver cirrhosis.  He complained of worsening edema and abdominal distention.  Lasix increased to 40 mg 3 times daily over the past week without improvement.  Short of breath even with just walking to his mailbox.  He was at [facility] emergency department about 2 weeks ago with a chest x-ray and CT of the abdomen and pelvis but feels he was told something about cirrhosis of the liver.  No alcohol  or drugs.  No abdominal pain.  Vitals are essentially stable.  On exam noted positive for distention and fluid wave.  Bedside ultrasound demonstrated large volume ascites.  Labs and imaging were not consistent with anasarca or volume overload.  He was discharged satisfactorily with recommendations for GI follow-up.  Labs showed SARS-CoV-2 positive.  CMP with mild hypokalemia at 3.4, completely normal LFTs.  Creatinine normal at 0.84.  BNP was normal.  CBC with mild anemia and hemoglobin at 12.4 which is normocytic and normochromic.  Platelets normal at 185.  INR normal at 1.2.  Today he states he's not been doing well. He began having abdominal swelling about 4 weeks ago; legs started swelling about 4 weeks ago. He has never been told he has cirrhosis until 3 weeks ago at [facility]. He was placed on fluid pills at the [facility]. Has nocturnal abdominal pain (7/10) when he lays down (cannot lay on his back, needs to sit in a recliner). Denies N/V, hematochezia. Has dark stools on iron pills(started 2 weeks ago); was told he is anemia and hypokalemic at the health department. Denies fever, chills. About a month ago he was 240 lbs, today he is 315 lb. Denies yellowing of skin/eyes, darkened urine, acute episodic confusion, generalized pruritis. Denies ETOH, has never drank any alcohol. Denies any history of drugs/IV drugs. Was COVID-19 positive in the ER and negative on 3/16. Denies URI or flu-like symptoms. Denies loss of sense of taste or smell. Denies chest pain, dyspnea, dizziness, lightheadedness, syncope, near  syncope. Denies any other upper or lower GI symptoms.  Notes he was intermittently taking up to 6-7 Tylenol a day with a headache over several years.  Approximately 60-75 minutes was personally spend on this patient's complex care today  Past Medical History:  Diagnosis Date  . Hypertension   . Liver cirrhosis ([place])   . Neuropathy     History reviewed. No pertinent surgical  history.  No current facility-administered medications for this visit.   Current Outpatient Medications  Medication Sig Dispense Refill  . Cholecalciferol (VITAMIN D3) 125 MCG (5000 UT) TABS Take 1 tablet by mouth daily.    . Cholecalciferol 1.25 MG (50000 UT) TABS Take 1 tablet by mouth daily.     . Ferrous Sulfate (IRON) 325 (65 Fe) MG TABS Take 1 tablet by mouth daily.    . furosemide (LASIX) 40 MG tablet Take 40 mg by mouth 2 (two) times daily as needed for fluid.    Marland Kitchen gabapentin (NEURONTIN) 300 MG capsule Take 300 mg by mouth at bedtime.     . metoprolol succinate (TOPROL-XL) 50 MG 24 hr tablet Take 50 mg by mouth daily. Take with or immediately following a meal.    . Multiple Vitamin (MULTIVITAMIN) tablet Take 1 tablet by mouth daily.    . Potassium 99 MG TABS Take 1 tablet by mouth daily.     Facility-Administered Medications Ordered in Other Visits  Medication Dose Route Frequency Provider Last Rate Last Admin  . furosemide (LASIX) injection 40 mg  40 mg Intravenous STAT Noemi Chapel, MD      . lidocaine (PF) (XYLOCAINE) 1 % injection 5 mL  5 mL Intradermal Once Noemi Chapel, MD        Allergies as of 09/10/2019  . (Not on File)    Family History  Problem Relation Age of Onset  . Liver disease Neg Hx     Social History   Socioeconomic History  . Marital status: Single    Spouse name: Not on file  . Number of children: Not on file  . Years of education: Not on file  . Highest education level: Not on file  Occupational History  . Not on file  Tobacco Use  . Smoking status: Never Smoker  . Smokeless tobacco: Never Used  Substance and Sexual Activity  . Alcohol use: Never  . Drug use: Never  . Sexual activity: Not on file  Other Topics Concern  . Not on file  Social History Narrative  . Not on file   Social Determinants of Health   Financial Resource Strain:   . Difficulty of Paying Living Expenses:   Food Insecurity:   . Worried About Sales executive in the Last Year:   . Arboriculturist in the Last Year:   Transportation Needs:   . Film/video editor (Medical):   Marland Kitchen Lack of Transportation (Non-Medical):   Physical Activity:   . Days of Exercise per Week:   . Minutes of Exercise per Session:   Stress:   . Feeling of Stress :   Social Connections:   . Frequency of Communication with Friends and Family:   . Frequency of Social Gatherings with Friends and Family:   . Attends Religious Services:   . Active Member of Clubs or Organizations:   . Attends Archivist Meetings:   Marland Kitchen Marital Status:   Intimate Partner Violence:   . Fear of Current or Ex-Partner:   . Emotionally Abused:   .  Physically Abused:   . Sexually Abused:     Subjective: Review of Systems  Constitutional: Negative for chills, fever, malaise/fatigue and weight loss.       Significant/severe weight gain  HENT: Negative for congestion and sore throat.   Respiratory: Positive for shortness of breath. Negative for cough.   Cardiovascular: Positive for orthopnea, leg swelling and PND. Negative for chest pain and palpitations.  Gastrointestinal: Positive for abdominal pain. Negative for blood in stool, diarrhea, melena (Dark stool on iron), nausea and vomiting.  Musculoskeletal: Negative for joint pain and myalgias.  Skin: Negative for rash.  Neurological: Positive for sensory change (in legs due to edema). Negative for dizziness and weakness.  Endo/Heme/Allergies: Does not bruise/bleed easily.  Psychiatric/Behavioral: Negative for depression. The patient is not nervous/anxious.   All other systems reviewed and are negative.      Objective: BP (!) 149/104   Pulse 97   Temp (!) 97.3 F (36.3 C) (Oral)   Ht 5' 7"  (1.702 m)   Wt (!) 315 lb (142.9 kg)   BMI 49.34 kg/m  Physical Exam Vitals and nursing note reviewed.  Constitutional:      General: He is not in acute distress.    Appearance: Normal appearance. He is obese. He is  ill-appearing. He is not toxic-appearing or diaphoretic.  HENT:     Head: Normocephalic and atraumatic.     Nose: No congestion or rhinorrhea.  Eyes:     General: No scleral icterus. Cardiovascular:     Rate and Rhythm: Normal rate and regular rhythm.     Heart sounds: Normal heart sounds.     Comments: Unable to palpate pedal pulses due to edema. Pitting edema up to 1+ bilateral hips Pulmonary:     Effort: Respiratory distress (significant dyspnea) present.     Breath sounds: Normal breath sounds.  Abdominal:     General: Abdomen is protuberant. Bowel sounds are normal. There is distension (severe distension).     Palpations: There is fluid wave. There is no hepatomegaly, splenomegaly or mass.     Tenderness: There is no abdominal tenderness. There is no guarding or rebound.     Hernia: No hernia is present.     Comments: Severe abdominal distension with tense ascites  Musculoskeletal:     Cervical back: Neck supple.     Right lower leg: 3+ Pitting Edema present.     Left lower leg: 3+ Pitting Edema present.  Skin:    General: Skin is warm and dry.     Coloration: Skin is not jaundiced.     Findings: No bruising or rash.  Neurological:     General: No focal deficit present.     Mental Status: He is alert and oriented to person, place, and time. Mental status is at baseline.  Psychiatric:        Mood and Affect: Mood normal.        Behavior: Behavior normal.        Thought Content: Thought content normal.       09/10/2019 10:52 AM   Disclaimer: This note was dictated with voice recognition software. Similar sounding words can inadvertently be transcribed and may not be corrected upon review.

## 2019-09-10 NOTE — Plan of Care (Signed)
8 liter of clear yellow fluid drained from abdomen. Dressing applied.

## 2019-09-11 ENCOUNTER — Inpatient Hospital Stay (HOSPITAL_COMMUNITY): Payer: Medicaid Other

## 2019-09-11 ENCOUNTER — Encounter (HOSPITAL_COMMUNITY): Payer: Self-pay | Admitting: Internal Medicine

## 2019-09-11 DIAGNOSIS — R601 Generalized edema: Secondary | ICD-10-CM

## 2019-09-11 DIAGNOSIS — R188 Other ascites: Secondary | ICD-10-CM

## 2019-09-11 DIAGNOSIS — K7460 Unspecified cirrhosis of liver: Secondary | ICD-10-CM

## 2019-09-11 DIAGNOSIS — I81 Portal vein thrombosis: Secondary | ICD-10-CM

## 2019-09-11 LAB — COMPREHENSIVE METABOLIC PANEL
ALT: 16 U/L (ref 0–44)
AST: 23 U/L (ref 15–41)
Albumin: 2.6 g/dL — ABNORMAL LOW (ref 3.5–5.0)
Alkaline Phosphatase: 70 U/L (ref 38–126)
Anion gap: 5 (ref 5–15)
BUN: 13 mg/dL (ref 6–20)
CO2: 30 mmol/L (ref 22–32)
Calcium: 8.3 mg/dL — ABNORMAL LOW (ref 8.9–10.3)
Chloride: 104 mmol/L (ref 98–111)
Creatinine, Ser: 0.72 mg/dL (ref 0.61–1.24)
GFR calc Af Amer: >60 mL/min (ref >60)
GFR calc non Af Amer: >60 mL/min (ref >60)
Glucose, Bld: 88 mg/dL (ref 70–99)
Potassium: 4 mmol/L (ref 3.5–5.1)
Sodium: 139 mmol/L (ref 135–145)
Total Bilirubin: 1.3 mg/dL — ABNORMAL HIGH (ref 0.3–1.2)
Total Protein: 6.5 g/dL (ref 6.5–8.1)

## 2019-09-11 LAB — ALPHA-1-ANTITRYPSIN: A-1 Antitrypsin, Ser: 206 mg/dL — ABNORMAL HIGH (ref 101–187)

## 2019-09-11 LAB — CBC
HCT: 38.4 % — ABNORMAL LOW (ref 39.0–52.0)
Hemoglobin: 12.1 g/dL — ABNORMAL LOW (ref 13.0–17.0)
MCH: 29.4 pg (ref 26.0–34.0)
MCHC: 31.5 g/dL (ref 30.0–36.0)
MCV: 93.4 fL (ref 80.0–100.0)
Platelets: 106 10*3/uL — ABNORMAL LOW (ref 150–400)
RBC: 4.11 MIL/uL — ABNORMAL LOW (ref 4.22–5.81)
RDW: 14.3 % (ref 11.5–15.5)
WBC: 5.8 10*3/uL (ref 4.0–10.5)
nRBC: 0 % (ref 0.0–0.2)

## 2019-09-11 LAB — PATHOLOGIST SMEAR REVIEW

## 2019-09-11 LAB — AFP TUMOR MARKER: AFP, Serum, Tumor Marker: 1.3 ng/mL (ref 0.0–8.3)

## 2019-09-11 LAB — ANA W/REFLEX IF POSITIVE: Anti Nuclear Antibody (ANA): NEGATIVE

## 2019-09-11 LAB — CERULOPLASMIN: Ceruloplasmin: 31.4 mg/dL — ABNORMAL HIGH (ref 16.0–31.0)

## 2019-09-11 MED ORDER — ALBUMIN HUMAN 25 % IV SOLN
25.0000 g | Freq: Two times a day (BID) | INTRAVENOUS | Status: AC
  Administered 2019-09-11 – 2019-09-13 (×6): 25 g via INTRAVENOUS
  Filled 2019-09-11 (×6): qty 100

## 2019-09-11 MED ORDER — FUROSEMIDE 10 MG/ML IJ SOLN
40.0000 mg | Freq: Two times a day (BID) | INTRAMUSCULAR | Status: DC
  Administered 2019-09-12 – 2019-09-22 (×20): 40 mg via INTRAVENOUS
  Filled 2019-09-11 (×21): qty 4

## 2019-09-11 MED ORDER — SPIRONOLACTONE 25 MG PO TABS
100.0000 mg | ORAL_TABLET | Freq: Every day | ORAL | Status: DC
  Administered 2019-09-12 – 2019-09-16 (×5): 100 mg via ORAL
  Filled 2019-09-11 (×5): qty 4

## 2019-09-11 MED ORDER — POLYETHYLENE GLYCOL 3350 17 G PO PACK
17.0000 g | PACK | Freq: Every day | ORAL | Status: DC
  Administered 2019-09-12 – 2019-09-22 (×8): 17 g via ORAL
  Filled 2019-09-11 (×9): qty 1

## 2019-09-11 NOTE — Progress Notes (Addendum)
Subjective:  New patient to [facility], seen in the office yesterday for recent diagnosis of cirrhosis/anasarca with associated dyspnea. Abdominal swelling and leg swelling for four + weeks. First found out about cirrhosis 3 weeks ago. Dark stools on iron. Weighed 240 pounds one months ago. 315 pounds yesterday in our office. Denies h/o etoh or drug use. Has been taking 6-7 Tylenol daily for headache off/on (once or twice per month) for several years. Chronically obese. Was 330 pounds a year ago, intentionally lost 90 pounds and then fluid issues started. On 3/14 CT showed nonocclusive portal vein thrombosis.  Objective: Vital signs in last 24 hours: Temp:  [97.9 F (36.6 C)-98.9 F (37.2 C)] 98.9 F (37.2 C) (04/08 0413) Pulse Rate:  [70-104] 85 (04/08 0413) Resp:  [13-27] 13 (04/08 0413) BP: (114-139)/(80-103) 114/80 (04/08 0413) SpO2:  [91 %-97 %] 93 % (04/08 0413) Weight:  [142.9 kg] 142.9 kg (04/07 0949) Last BM Date: 09/09/19 General:   Alert,  Chronically ill appearing male, cooperative in NAD Head:  Normocephalic and atraumatic. Eyes:  Sclera clear, no icterus.  Chest: CTA bilaterally without rales, rhonchi, crackles.    Heart:  Regular rate and rhythm; no murmurs, clicks, rubs,  or gallops. Abdomen:  Soft, nontender and distended but not tense. Normal bowel sounds, without guarding, and without rebound.   Extremities:  Without clubbing, deformity or edema. 3+ edenma bilaterally to the knees. Erythema noted on dorsum of both feet. Neurologic:  Alert and  oriented x4;  grossly normal neurologically. Skin:  Intact without significant lesions. See extremitity exam above for rashes. Psych:  Alert and cooperative. Normal mood and affect.  Intake/Output from previous day: 04/07 0701 - 04/08 0700 In: 240 [P.O.:240] Out: 1450 [Urine:1450] Intake/Output this shift: No intake/output data recorded.  Lab Results: CBC Recent Labs    09/10/19 1025 09/11/19 0535  WBC 6.7 5.8  HGB 12.1*  12.1*  HCT 37.5* 38.4*  MCV 92.1 93.4  PLT 111* 106*   BMET Recent Labs    09/10/19 1025 09/11/19 0535  NA 137 139  K 3.6 4.0  CL 102 104  CO2 27 30  GLUCOSE 103* 88  BUN 13 13  CREATININE 0.72 0.72  CALCIUM 8.3* 8.3*   LFTs Recent Labs    09/10/19 1025 09/11/19 0535  BILITOT 0.7 1.3*  ALKPHOS 80 70  AST 23 23  ALT 17 16  PROT 7.4 6.5  ALBUMIN 2.6* 2.6*   No results for input(s): LIPASE in the last 72 hours. PT/INR Recent Labs    09/10/19 1025 09/10/19 1526  LABPROT 15.1 15.2  INR 1.2 1.2      Imaging Studies: DG Chest Port 1 View  Result Date: 09/10/2019 CLINICAL DATA:  Shortness of breath. EXAM: PORTABLE CHEST 1 VIEW COMPARISON:  August 25, 2019. FINDINGS: Stable cardiomediastinal silhouette. No pneumothorax is noted. Increased left basilar atelectasis or infiltrate is noted. Minimal right basilar subsegmental atelectasis or infiltrate is noted. Bony thorax is unremarkable. IMPRESSION: Increased bibasilar subsegmental atelectasis or infiltrates, left greater than right. Electronically Signed   By: Marijo Conception M.D.   On: 09/10/2019 10:14   DG Chest Portable 1 View  Result Date: 08/25/2019 CLINICAL DATA:  Shortness of breath. Abdominal swelling and leg swelling. EXAM: PORTABLE CHEST 1 VIEW COMPARISON:  08/17/2019 FINDINGS: The heart size is within normal limits. Slight pulmonary vascular prominence. Minimal atelectasis at the left lung base. No pulmonary edema or visible pleural effusions. No bone abnormality. IMPRESSION: Slight pulmonary vascular prominence. Electronically Signed  By: Lorriane Shire M.D.   On: 08/25/2019 13:12   ECHOCARDIOGRAM COMPLETE  Result Date: 09/10/2019    ECHOCARDIOGRAM REPORT   Patient Name:   Alejandro Porter Date of Exam: 09/10/2019 Medical Rec #:  [id number]       Height:       69.0 in Accession #:    [id number]      Weight:       315.0 lb Date of Birth:  08-24-1975        BSA:          2.506 m Patient Age:    44 years        BP:            134/94 mmHg Patient Gender: M               HR:           92 bpm. Exam Location:  [facility] Procedure: 2D Echo Indications:    Dyspnea 786.09 / R06.00  History:        Patient has no prior history of Echocardiogram examinations.                 Risk Factors:Non-Smoker and Hypertension. Edema, Portal vein                 thrombosis, History of COVID 19.  Sonographer:    Leavy Cella RDCS (AE) Referring Phys: [phone] DAVID TAT IMPRESSIONS  1. Left ventricular ejection fraction, by estimation, is 60 to 65%. The left ventricle has normal function. The left ventricle has no regional wall motion abnormalities. Left ventricular diastolic parameters are indeterminate.  2. Right ventricular systolic function is normal. The right ventricular size is normal.  3. The mitral valve was not well visualized. No evidence of mitral valve regurgitation. No evidence of mitral stenosis.  4. The aortic valve was not well visualized. Aortic valve regurgitation is not visualized. No aortic stenosis is present. FINDINGS  Left Ventricle: Left ventricular ejection fraction, by estimation, is 60 to 65%. The left ventricle has normal function. The left ventricle has no regional wall motion abnormalities. The left ventricular internal cavity size was normal in size. There is  no left ventricular hypertrophy. Left ventricular diastolic parameters are indeterminate. Right Ventricle: The right ventricular size is normal. No increase in right ventricular wall thickness. Right ventricular systolic function is normal. Left Atrium: Left atrial size was normal in size. Right Atrium: Right atrial size was normal in size. Pericardium: There is no evidence of pericardial effusion. Mitral Valve: The mitral valve was not well visualized. No evidence of mitral valve regurgitation. No evidence of mitral valve stenosis. Tricuspid Valve: The tricuspid valve is normal in structure. Tricuspid valve regurgitation is not demonstrated. No evidence of tricuspid  stenosis. Aortic Valve: The aortic valve was not well visualized. Aortic valve regurgitation is not visualized. No aortic stenosis is present. Aortic valve mean gradient measures 5.2 mmHg. Aortic valve peak gradient measures 9.3 mmHg. Aortic valve area, by VTI measures 2.81 cm. Pulmonic Valve: The pulmonic valve was not well visualized. Pulmonic valve regurgitation is not visualized. No evidence of pulmonic stenosis. Aorta: The aortic root was not well visualized. Pulmonary Artery: Indeterminate PASP, inadequate TR jet. Venous: The inferior vena cava was not well visualized. IAS/Shunts: The interatrial septum was not well visualized. Additional Comments: Mild ascites is present.  LEFT VENTRICLE PLAX 2D LVIDd:         4.96 cm  Diastology LVIDs:  3.02 cm  LV e' lateral:   14.80 cm/s LV PW:         0.90 cm  LV E/e' lateral: 3.8 LV IVS:        0.86 cm  LV e' medial:    8.16 cm/s LVOT diam:     2.30 cm  LV E/e' medial:  6.9 LV SV:         86 LV SV Index:   34 LVOT Area:     4.15 cm  RIGHT VENTRICLE RV S prime:     12.60 cm/s TAPSE (M-mode): 2.8 cm LEFT ATRIUM             Index LA diam:        4.30 cm 1.72 cm/m LA Vol (A2C):   49.2 ml 19.63 ml/m LA Vol (A4C):   38.5 ml 15.36 ml/m LA Biplane Vol: 47.3 ml 18.87 ml/m  AORTIC VALVE AV Area (Vmax):    2.62 cm AV Area (Vmean):   2.50 cm AV Area (VTI):     2.81 cm AV Vmax:           152.23 cm/s AV Vmean:          107.685 cm/s AV VTI:            0.307 m AV Peak Grad:      9.3 mmHg AV Mean Grad:      5.2 mmHg LVOT Vmax:         95.98 cm/s LVOT Vmean:        64.882 cm/s LVOT VTI:          0.208 m LVOT/AV VTI ratio: 0.68  AORTA Ao Root diam: 3.40 cm MITRAL VALVE MV Area (PHT): 3.81 cm    SHUNTS MV Decel Time: 199 msec    Systemic VTI:  0.21 m MV E velocity: 56.10 cm/s  Systemic Diam: 2.30 cm MV A velocity: 54.80 cm/s MV E/A ratio:  1.02 Carlyle Dolly MD Electronically signed by Carlyle Dolly MD Signature Date/Time: 09/10/2019/4:56:54 PM    Final   [2  weeks]   Assessment: 44 y/o male with recent diagnosis of cirrhosis presenting with anasarca associated with shortness of breath. Has failed outpatient diuretic regimen.   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 No history of etoh use. Viral markers negative. Previously weighed over 330 pounds, with intentional weight loss of 90 pounds trying to get healthy, prior to onset of fluid retention several weeks ago. MELD Na 8. Child Pugh B.  Portal vein thrombosis, nonocclusive, extending into right intrahepatic portal vein, first noted at time of CT 08/16/20.  Ascites/anasarca: s/p LVAP yesterday with no evidence of SBP on fluid cell count. SAAG 1.7.  Plan: 1. F/U pending labs/fluid analysis. 2. Albumin/lasix runs X 6.  3. Daily weights.  4. Consider EGD in the next 24 hours to screen for varices prior to consideration of anticoagulation for recent portal vein thrombosis.  5. NPO until determine timing of EGD. 6. Outpatient liver transplant evaluation.   We would like to thank you for the opportunity to participate in the care of Antoine Vandermeulen.  Laureen Ochs. Bernarda Caffey [facility] [phone] 4/8/202110:18 AM     LOS: 1 day    Addendum: discussed with Dr. Oneida Alar. Needs updated imaging to evaluate portal vein thrombosis. Liver doppler ordered.  Laureen Ochs. Bernarda Caffey [facility] [phone] 4/8/202112:03 PM

## 2019-09-11 NOTE — Progress Notes (Signed)
Patient Demographics:    Alejandro Porter, is a 44 y.o. male, DOB - Nov 02, 1975, TGG:[id number]  Admit date - 09/10/2019   Admitting Physician Orson Eva, MD  Outpatient Primary MD for the patient is [facility]  LOS - 1   Chief Complaint  Patient presents with  . Ascites        Subjective:    Alejandro Porter today has no fevers, no emesis,  No chest pain,   -Sister at bedside, questions answered  Assessment  & Plan :    Active Problems:   Cirrhosis of liver with ascites (HCC)   Portal vein thrombosis    1) decompensated liver cirrhosis suspect NASH------ for hepatitis profile negative, patient denies significant EtOH use -MELD Na 8. Child Pugh B. -s/p paracentesis on 09/10/2019 -Patient failed outpatient diuresis with oral agents, continue IV Lasix -GI consult appreciated  2)Portal vein thrombosis, nonocclusive, extending into right intrahepatic portal vein, first noted at time of CT 08/16/20--- adult from anticoagulation until after EGD to rule out varices that may increase bleeding risk  3)H/o COVID 19 infection--diagnosed 08/25/2019, repeat Covid test negative on 09/10/2019 -Asymptomatic,   4)HTN--stop metoprolol, use propanolol due to possible portal hypertension   Disposition/Need for in-Hospital Stay- patient unable to be discharged at this time due to ---decompensated liver cirrhosis requiring aggressive iv diuresis -Patient From: Home D/C Place: Home Barriers: Not Clinically Stable- --Patient failed outpatient diuresis with oral agents, continue IV Lasix  Code Status : Full code  Family Communication:   (patient is alert, awake and coherent) -Sister at bedside  Consults  :  GI  DVT Prophylaxis  : - SCDs   Lab Results  Component Value Date   PLT 106 (L) 09/11/2019   Inpatient Medications  Scheduled Meds: . [START ON 09/12/2019] furosemide  40 mg Intravenous  BID  . gabapentin  300 mg Oral QHS  . lidocaine (PF)  5 mL Intradermal Once  . metoprolol succinate  50 mg Oral Daily  . polyethylene glycol  17 g Oral Daily  . [START ON 09/12/2019] spironolactone  100 mg Oral Q breakfast   Continuous Infusions: . albumin human 25 g (09/11/19 1732)   PRN Meds:.  Anti-infectives (From admission, onward)   None       Objective:   Vitals:   09/11/19 0413 09/11/19 0942 09/11/19 1007 09/11/19 1334  BP: 114/80  124/85 117/80  Pulse: 85  82 98  Resp: 13   20  Temp: 98.9 F (37.2 C)   97.7 F (36.5 C)  TempSrc: Oral   Oral  SpO2: 93%   100%  Weight:  130.3 kg    Height:        Wt Readings from Last 3 Encounters:  09/11/19 130.3 kg  09/10/19 (!) 142.9 kg  08/25/19 133.8 kg     Intake/Output Summary (Last 24 hours) at 09/11/2019 1913 Last data filed at 09/11/2019 1700 Gross per 24 hour  Intake 802.73 ml  Output 1400 ml  Net -597.27 ml     Physical Exam  Gen:- Awake Alert, no acute distress HEENT:- [place].AT,   Neck-Supple Neck,No JVD,.  Lungs-  CTAB , fair symmetrical air movement CV- S1, S2 normal, regular  Abd-diminished bowel sounds, no significant tenderness,  ascites with fluid thrill noted,    Extremity/Skin:- No  edema, pedal pulses present  Psych-affect is appropriate, oriented x3 Neuro-generalized weakness, no new focal deficits, no tremors   Data Review:   Micro Results Recent Results (from the past 240 hour(s))  Culture, body fluid-bottle     Status: None (Preliminary result)   Collection Time: 09/10/19 10:27 AM   Specimen: Ascitic  Result Value Ref Range Status   Specimen Description ASCITIC  Final   Special Requests BOTTLES DRAWN AEROBIC AND ANAEROBIC 10CC  Final   Culture   Final    NO GROWTH < 24 HOURS Performed at [facility], [address], [place], [place] [address]    Report Status PENDING  Incomplete  Gram stain     Status: None   Collection Time: 09/10/19 10:27 AM   Specimen: Ascitic  Result Value Ref  Range Status   Specimen Description ASCITIC  Final   Special Requests NONE  Final   Gram Stain   Final    NO ORGANISMS SEEN WBC PRESENT, PREDOMINANTLY MONONUCLEAR CYTOSPIN SMEAR Performed at [facility], [address] [place], [place] [address]    Report Status 09/10/2019 FINAL  Final  SARS CORONAVIRUS 2 (TAT 6-24 HRS) Nasopharyngeal Nasopharyngeal Swab     Status: None   Collection Time: 09/10/19 12:09 PM   Specimen: Nasopharyngeal Swab  Result Value Ref Range Status   SARS Coronavirus 2 NEGATIVE NEGATIVE Final    Comment: (NOTE) SARS-CoV-2 target nucleic acids are NOT DETECTED. The SARS-CoV-2 RNA is generally detectable in upper and lower respiratory specimens during the acute phase of infection. Negative results do not preclude SARS-CoV-2 infection, do not rule out co-infections with other pathogens, and should not be used as the sole basis for treatment or other patient management decisions. Negative results must be combined with clinical observations, patient history, and epidemiological information. The expected result is Negative. Fact Sheet for Patients: SugarRoll.be Fact Sheet for Healthcare Providers: https://www.woods-mathews.com/ This test is not yet approved or cleared by the [place] FDA and  has been authorized for detection and/or diagnosis of SARS-CoV-2 by FDA under an Emergency Use Authorization (EUA). This EUA will remain  in effect (meaning this test can be used) for the duration of the COVID-19 declaration under Section 56 4(b)(1) of the Act, 21 U.S.C. section 360bbb-3(b)(1), unless the authorization is terminated or revoked sooner. Performed at [facility], [place] [address], [place], [place] [address]     Radiology Reports DG Chest Disputanta 1 View  Result Date: 09/10/2019 CLINICAL DATA:  Shortness of breath. EXAM: PORTABLE CHEST 1 VIEW COMPARISON:  August 25, 2019. FINDINGS: Stable cardiomediastinal  silhouette. No pneumothorax is noted. Increased left basilar atelectasis or infiltrate is noted. Minimal right basilar subsegmental atelectasis or infiltrate is noted. Bony thorax is unremarkable. IMPRESSION: Increased bibasilar subsegmental atelectasis or infiltrates, left greater than right. Electronically Signed   By: Marijo Conception M.D.   On: 09/10/2019 10:14   DG Chest Portable 1 View  Result Date: 08/25/2019 CLINICAL DATA:  Shortness of breath. Abdominal swelling and leg swelling. EXAM: PORTABLE CHEST 1 VIEW COMPARISON:  08/17/2019 FINDINGS: The heart size is within normal limits. Slight pulmonary vascular prominence. Minimal atelectasis at the left lung base. No pulmonary edema or visible pleural effusions. No bone abnormality. IMPRESSION: Slight pulmonary vascular prominence. Electronically Signed   By: Lorriane Shire M.D.   On: 08/25/2019 13:12   ECHOCARDIOGRAM COMPLETE  Result Date: 09/10/2019    ECHOCARDIOGRAM REPORT  Patient Name:   KIRK BASQUEZ Date of Exam: 09/10/2019 Medical Rec #:  [id number]       Height:       69.0 in Accession #:    [id number]      Weight:       315.0 lb Date of Birth:  08/04/1975        BSA:          2.506 m Patient Age:    18 years        BP:           134/94 mmHg Patient Gender: M               HR:           92 bpm. Exam Location:  [facility] Procedure: 2D Echo Indications:    Dyspnea 786.09 / R06.00  History:        Patient has no prior history of Echocardiogram examinations.                 Risk Factors:Non-Smoker and Hypertension. Edema, Portal vein                 thrombosis, History of COVID 19.  Sonographer:    Leavy Cella RDCS (AE) Referring Phys: [phone] DAVID TAT IMPRESSIONS  1. Left ventricular ejection fraction, by estimation, is 60 to 65%. The left ventricle has normal function. The left ventricle has no regional wall motion abnormalities. Left ventricular diastolic parameters are indeterminate.  2. Right ventricular systolic function is normal. The right  ventricular size is normal.  3. The mitral valve was not well visualized. No evidence of mitral valve regurgitation. No evidence of mitral stenosis.  4. The aortic valve was not well visualized. Aortic valve regurgitation is not visualized. No aortic stenosis is present. FINDINGS  Left Ventricle: Left ventricular ejection fraction, by estimation, is 60 to 65%. The left ventricle has normal function. The left ventricle has no regional wall motion abnormalities. The left ventricular internal cavity size was normal in size. There is  no left ventricular hypertrophy. Left ventricular diastolic parameters are indeterminate. Right Ventricle: The right ventricular size is normal. No increase in right ventricular wall thickness. Right ventricular systolic function is normal. Left Atrium: Left atrial size was normal in size. Right Atrium: Right atrial size was normal in size. Pericardium: There is no evidence of pericardial effusion. Mitral Valve: The mitral valve was not well visualized. No evidence of mitral valve regurgitation. No evidence of mitral valve stenosis. Tricuspid Valve: The tricuspid valve is normal in structure. Tricuspid valve regurgitation is not demonstrated. No evidence of tricuspid stenosis. Aortic Valve: The aortic valve was not well visualized. Aortic valve regurgitation is not visualized. No aortic stenosis is present. Aortic valve mean gradient measures 5.2 mmHg. Aortic valve peak gradient measures 9.3 mmHg. Aortic valve area, by VTI measures 2.81 cm. Pulmonic Valve: The pulmonic valve was not well visualized. Pulmonic valve regurgitation is not visualized. No evidence of pulmonic stenosis. Aorta: The aortic root was not well visualized. Pulmonary Artery: Indeterminate PASP, inadequate TR jet. Venous: The inferior vena cava was not well visualized. IAS/Shunts: The interatrial septum was not well visualized. Additional Comments: Mild ascites is present.  LEFT VENTRICLE PLAX 2D LVIDd:         4.96 cm   Diastology LVIDs:         3.02 cm  LV e' lateral:   14.80 cm/s LV PW:         0.90  cm  LV E/e' lateral: 3.8 LV IVS:        0.86 cm  LV e' medial:    8.16 cm/s LVOT diam:     2.30 cm  LV E/e' medial:  6.9 LV SV:         86 LV SV Index:   34 LVOT Area:     4.15 cm  RIGHT VENTRICLE RV S prime:     12.60 cm/s TAPSE (M-mode): 2.8 cm LEFT ATRIUM             Index LA diam:        4.30 cm 1.72 cm/m LA Vol (A2C):   49.2 ml 19.63 ml/m LA Vol (A4C):   38.5 ml 15.36 ml/m LA Biplane Vol: 47.3 ml 18.87 ml/m  AORTIC VALVE AV Area (Vmax):    2.62 cm AV Area (Vmean):   2.50 cm AV Area (VTI):     2.81 cm AV Vmax:           152.23 cm/s AV Vmean:          107.685 cm/s AV VTI:            0.307 m AV Peak Grad:      9.3 mmHg AV Mean Grad:      5.2 mmHg LVOT Vmax:         95.98 cm/s LVOT Vmean:        64.882 cm/s LVOT VTI:          0.208 m LVOT/AV VTI ratio: 0.68  AORTA Ao Root diam: 3.40 cm MITRAL VALVE MV Area (PHT): 3.81 cm    SHUNTS MV Decel Time: 199 msec    Systemic VTI:  0.21 m MV E velocity: 56.10 cm/s  Systemic Diam: 2.30 cm MV A velocity: 54.80 cm/s MV E/A ratio:  1.02 Carlyle Dolly MD Electronically signed by Carlyle Dolly MD Signature Date/Time: 09/10/2019/4:56:54 PM    Final    US LIVER DOPPLER  Result Date: 09/11/2019 CLINICAL DATA:  Cirrhosis, ascites, nonocclusive portal thrombus by CT EXAM: DUPLEX ULTRASOUND OF LIVER TECHNIQUE: Color and duplex Doppler ultrasound was performed to evaluate the hepatic in-flow and out-flow vessels. COMPARISON:  08/17/2019 CT FINDINGS: Liver: Increased echogenicity with surface nodularity compatible with hepatic cirrhosis. No focal lesion, mass or intrahepatic biliary ductal dilatation. Main Portal Vein size: 2.7 cm Portal Vein Velocities Main Prox:  35 cm/sec Main Mid: 39 cm/sec Main Dist:  33 cm/sec Right: 26 cm/sec Left: 23 cm/sec Hepatic Vein Velocities Right:  40 cm/sec Middle:  27 cm/sec Left:  46 cm/sec IVC: Present and patent with normal respiratory phasicity. Hepatic  Artery Velocity:  37 cm/sec Splenic Vein Velocity:  18 cm/sec Spleen: 18 cm x 5.5 cm x 22 cm with a total volume of 1163 cm^3 (411 cm^3 is upper limit normal) Portal Vein Occlusion/Thrombus: Nonocclusive hypoechoic thrombus along the main portal vein wall posteriorly extending into the right portal vein. This correlates with the CT. Splenic Vein Occlusion/Thrombus: No Ascites: Moderate ascites present. Varices: None IMPRESSION: Nonocclusive portal vein thrombus within the main portal vein extending into the right portal vein. Hepatopetal flow is maintained. Hepatic cirrhosis and ascites.  Associated splenomegaly. Electronically Signed   By: Jerilynn Mages.  Shick M.D.   On: 09/11/2019 16:02     CBC Recent Labs  Lab 09/10/19 1025 09/11/19 0535  WBC 6.7 5.8  HGB 12.1* 12.1*  HCT 37.5* 38.4*  PLT 111* 106*  MCV 92.1 93.4  MCH 29.7 29.4  MCHC 32.3 31.5  RDW 14.2 14.3  LYMPHSABS 0.6*  --   MONOABS 0.7  --   EOSABS 0.2  --   BASOSABS 0.0  --     Chemistries  Recent Labs  Lab 09/10/19 1025 09/11/19 0535  NA 137 139  K 3.6 4.0  CL 102 104  CO2 27 30  GLUCOSE 103* 88  BUN 13 13  CREATININE 0.72 0.72  CALCIUM 8.3* 8.3*  AST 23 23  ALT 17 16  ALKPHOS 80 70  BILITOT 0.7 1.3*   ------------------------------------------------------------------------------------------------------------------ No results for input(s): CHOL, HDL, LDLCALC, TRIG, CHOLHDL, LDLDIRECT in the last 72 hours.  No results found for: HGBA1C ------------------------------------------------------------------------------------------------------------------ No results for input(s): TSH, T4TOTAL, T3FREE, THYROIDAB in the last 72 hours.  Invalid input(s): FREET3 ------------------------------------------------------------------------------------------------------------------ Recent Labs    09/10/19 1526  FERRITIN 193    Coagulation profile Recent Labs  Lab 09/10/19 1025 09/10/19 1526  INR 1.2 1.2    No results for  input(s): DDIMER in the last 72 hours.  Cardiac Enzymes No results for input(s): CKMB, TROPONINI, MYOGLOBIN in the last 168 hours.  Invalid input(s): CK ------------------------------------------------------------------------------------------------------------------    Component Value Date/Time   BNP 44.0 08/25/2019 1329     Beverlyn Mcginness M.D on 09/11/2019 at 7:13 PM  Go to [email or web address] - for contact info  [facility] - Office  [phone]

## 2019-09-11 NOTE — H&P (View-Only) (Signed)
Subjective:  New patient to [facility], seen in the office yesterday for recent diagnosis of cirrhosis/anasarca with associated dyspnea. Abdominal swelling and leg swelling for four + weeks. First found out about cirrhosis 3 weeks ago. Dark stools on iron. Weighed 240 pounds one months ago. 315 pounds yesterday in our office. Denies h/o etoh or drug use. Has been taking 6-7 Tylenol daily for headache off/on (once or twice per month) for several years. Chronically obese. Was 330 pounds a year ago, intentionally lost 90 pounds and then fluid issues started. On 3/14 CT showed nonocclusive portal vein thrombosis.  Objective: Vital signs in last 24 hours: Temp:  [97.9 F (36.6 C)-98.9 F (37.2 C)] 98.9 F (37.2 C) (04/08 0413) Pulse Rate:  [70-104] 85 (04/08 0413) Resp:  [13-27] 13 (04/08 0413) BP: (114-139)/(80-103) 114/80 (04/08 0413) SpO2:  [91 %-97 %] 93 % (04/08 0413) Weight:  [142.9 kg] 142.9 kg (04/07 0949) Last BM Date: 09/09/19 General:   Alert,  Chronically ill appearing male, cooperative in NAD Head:  Normocephalic and atraumatic. Eyes:  Sclera clear, no icterus.  Chest: CTA bilaterally without rales, rhonchi, crackles.    Heart:  Regular rate and rhythm; no murmurs, clicks, rubs,  or gallops. Abdomen:  Soft, nontender and distended but not tense. Normal bowel sounds, without guarding, and without rebound.   Extremities:  Without clubbing, deformity or edema. 3+ edenma bilaterally to the knees. Erythema noted on dorsum of both feet. Neurologic:  Alert and  oriented x4;  grossly normal neurologically. Skin:  Intact without significant lesions. See extremitity exam above for rashes. Psych:  Alert and cooperative. Normal mood and affect.  Intake/Output from previous day: 04/07 0701 - 04/08 0700 In: 240 [P.O.:240] Out: 1450 [Urine:1450] Intake/Output this shift: No intake/output data recorded.  Lab Results: CBC Recent Labs    09/10/19 1025 09/11/19 0535  WBC 6.7 5.8  HGB 12.1*  12.1*  HCT 37.5* 38.4*  MCV 92.1 93.4  PLT 111* 106*   BMET Recent Labs    09/10/19 1025 09/11/19 0535  NA 137 139  K 3.6 4.0  CL 102 104  CO2 27 30  GLUCOSE 103* 88  BUN 13 13  CREATININE 0.72 0.72  CALCIUM 8.3* 8.3*   LFTs Recent Labs    09/10/19 1025 09/11/19 0535  BILITOT 0.7 1.3*  ALKPHOS 80 70  AST 23 23  ALT 17 16  PROT 7.4 6.5  ALBUMIN 2.6* 2.6*   No results for input(s): LIPASE in the last 72 hours. PT/INR Recent Labs    09/10/19 1025 09/10/19 1526  LABPROT 15.1 15.2  INR 1.2 1.2      Imaging Studies: DG Chest Port 1 View  Result Date: 09/10/2019 CLINICAL DATA:  Shortness of breath. EXAM: PORTABLE CHEST 1 VIEW COMPARISON:  August 25, 2019. FINDINGS: Stable cardiomediastinal silhouette. No pneumothorax is noted. Increased left basilar atelectasis or infiltrate is noted. Minimal right basilar subsegmental atelectasis or infiltrate is noted. Bony thorax is unremarkable. IMPRESSION: Increased bibasilar subsegmental atelectasis or infiltrates, left greater than right. Electronically Signed   By: Marijo Conception M.D.   On: 09/10/2019 10:14   DG Chest Portable 1 View  Result Date: 08/25/2019 CLINICAL DATA:  Shortness of breath. Abdominal swelling and leg swelling. EXAM: PORTABLE CHEST 1 VIEW COMPARISON:  08/17/2019 FINDINGS: The heart size is within normal limits. Slight pulmonary vascular prominence. Minimal atelectasis at the left lung base. No pulmonary edema or visible pleural effusions. No bone abnormality. IMPRESSION: Slight pulmonary vascular prominence. Electronically Signed  By: Lorriane Shire M.D.   On: 08/25/2019 13:12   ECHOCARDIOGRAM COMPLETE  Result Date: 09/10/2019    ECHOCARDIOGRAM REPORT   Patient Name:   Alejandro Porter Date of Exam: 09/10/2019 Medical Rec #:  [id number]       Height:       69.0 in Accession #:    [id number]      Weight:       315.0 lb Date of Birth:  05/10/1976        BSA:          2.506 m Patient Age:    44 years        BP:            134/94 mmHg Patient Gender: M               HR:           92 bpm. Exam Location:  [facility] Procedure: 2D Echo Indications:    Dyspnea 786.09 / R06.00  History:        Patient has no prior history of Echocardiogram examinations.                 Risk Factors:Non-Smoker and Hypertension. Edema, Portal vein                 thrombosis, History of COVID 19.  Sonographer:    Leavy Cella RDCS (AE) Referring Phys: [phone] DAVID TAT IMPRESSIONS  1. Left ventricular ejection fraction, by estimation, is 60 to 65%. The left ventricle has normal function. The left ventricle has no regional wall motion abnormalities. Left ventricular diastolic parameters are indeterminate.  2. Right ventricular systolic function is normal. The right ventricular size is normal.  3. The mitral valve was not well visualized. No evidence of mitral valve regurgitation. No evidence of mitral stenosis.  4. The aortic valve was not well visualized. Aortic valve regurgitation is not visualized. No aortic stenosis is present. FINDINGS  Left Ventricle: Left ventricular ejection fraction, by estimation, is 60 to 65%. The left ventricle has normal function. The left ventricle has no regional wall motion abnormalities. The left ventricular internal cavity size was normal in size. There is  no left ventricular hypertrophy. Left ventricular diastolic parameters are indeterminate. Right Ventricle: The right ventricular size is normal. No increase in right ventricular wall thickness. Right ventricular systolic function is normal. Left Atrium: Left atrial size was normal in size. Right Atrium: Right atrial size was normal in size. Pericardium: There is no evidence of pericardial effusion. Mitral Valve: The mitral valve was not well visualized. No evidence of mitral valve regurgitation. No evidence of mitral valve stenosis. Tricuspid Valve: The tricuspid valve is normal in structure. Tricuspid valve regurgitation is not demonstrated. No evidence of tricuspid  stenosis. Aortic Valve: The aortic valve was not well visualized. Aortic valve regurgitation is not visualized. No aortic stenosis is present. Aortic valve mean gradient measures 5.2 mmHg. Aortic valve peak gradient measures 9.3 mmHg. Aortic valve area, by VTI measures 2.81 cm. Pulmonic Valve: The pulmonic valve was not well visualized. Pulmonic valve regurgitation is not visualized. No evidence of pulmonic stenosis. Aorta: The aortic root was not well visualized. Pulmonary Artery: Indeterminate PASP, inadequate TR jet. Venous: The inferior vena cava was not well visualized. IAS/Shunts: The interatrial septum was not well visualized. Additional Comments: Mild ascites is present.  LEFT VENTRICLE PLAX 2D LVIDd:         4.96 cm  Diastology LVIDs:  3.02 cm  LV e' lateral:   14.80 cm/s LV PW:         0.90 cm  LV E/e' lateral: 3.8 LV IVS:        0.86 cm  LV e' medial:    8.16 cm/s LVOT diam:     2.30 cm  LV E/e' medial:  6.9 LV SV:         86 LV SV Index:   34 LVOT Area:     4.15 cm  RIGHT VENTRICLE RV S prime:     12.60 cm/s TAPSE (M-mode): 2.8 cm LEFT ATRIUM             Index LA diam:        4.30 cm 1.72 cm/m LA Vol (A2C):   49.2 ml 19.63 ml/m LA Vol (A4C):   38.5 ml 15.36 ml/m LA Biplane Vol: 47.3 ml 18.87 ml/m  AORTIC VALVE AV Area (Vmax):    2.62 cm AV Area (Vmean):   2.50 cm AV Area (VTI):     2.81 cm AV Vmax:           152.23 cm/s AV Vmean:          107.685 cm/s AV VTI:            0.307 m AV Peak Grad:      9.3 mmHg AV Mean Grad:      5.2 mmHg LVOT Vmax:         95.98 cm/s LVOT Vmean:        64.882 cm/s LVOT VTI:          0.208 m LVOT/AV VTI ratio: 0.68  AORTA Ao Root diam: 3.40 cm MITRAL VALVE MV Area (PHT): 3.81 cm    SHUNTS MV Decel Time: 199 msec    Systemic VTI:  0.21 m MV E velocity: 56.10 cm/s  Systemic Diam: 2.30 cm MV A velocity: 54.80 cm/s MV E/A ratio:  1.02 Carlyle Dolly MD Electronically signed by Carlyle Dolly MD Signature Date/Time: 09/10/2019/4:56:54 PM    Final   [2  weeks]   Assessment: 44 y/o male with recent diagnosis of cirrhosis presenting with anasarca associated with shortness of breath. Has failed outpatient diuretic regimen.   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 No history of etoh use. Viral markers negative. Previously weighed over 330 pounds, with intentional weight loss of 90 pounds trying to get healthy, prior to onset of fluid retention several weeks ago. MELD Na 8. Child Pugh B.  Portal vein thrombosis, nonocclusive, extending into right intrahepatic portal vein, first noted at time of CT 08/16/20.  Ascites/anasarca: s/p LVAP yesterday with no evidence of SBP on fluid cell count. SAAG 1.7.  Plan: 1. F/U pending labs/fluid analysis. 2. Albumin/lasix runs X 6.  3. Daily weights.  4. Consider EGD in the next 24 hours to screen for varices prior to consideration of anticoagulation for recent portal vein thrombosis.  5. NPO until determine timing of EGD. 6. Outpatient liver transplant evaluation.   We would like to thank you for the opportunity to participate in the care of Alejandro Porter.  Laureen Ochs. Bernarda Caffey [facility] [phone] 4/8/202110:18 AM     LOS: 1 day    Addendum: discussed with Dr. Oneida Alar. Needs updated imaging to evaluate portal vein thrombosis. Liver doppler ordered.  Laureen Ochs. Bernarda Caffey [facility] [phone] 4/8/202112:03 PM

## 2019-09-12 ENCOUNTER — Encounter (HOSPITAL_COMMUNITY)
Admission: EM | Disposition: A | Discharge status: Home / Self Care | Payer: Self-pay | Source: Ambulatory Visit | Attending: Family Medicine

## 2019-09-12 ENCOUNTER — Encounter (HOSPITAL_COMMUNITY): Payer: Self-pay | Admitting: Internal Medicine

## 2019-09-12 HISTORY — PX: ESOPHAGOGASTRODUODENOSCOPY: SHX5428

## 2019-09-12 HISTORY — PX: ESOPHAGEAL BANDING: SHX5518

## 2019-09-12 HISTORY — PX: BIOPSY: SHX5522

## 2019-09-12 LAB — APTT: aPTT: 36 seconds (ref 24–36)

## 2019-09-12 LAB — HEPARIN LEVEL (UNFRACTIONATED): Heparin Unfractionated: 0.13 IU/mL — ABNORMAL LOW (ref 0.30–0.70)

## 2019-09-12 SURGERY — EGD (ESOPHAGOGASTRODUODENOSCOPY)
Anesthesia: Moderate Sedation

## 2019-09-12 MED ORDER — MEPERIDINE HCL 50 MG/ML IJ SOLN
INTRAMUSCULAR | Status: AC
  Filled 2019-09-12: qty 1

## 2019-09-12 MED ORDER — MEPERIDINE HCL 100 MG/ML IJ SOLN
INTRAMUSCULAR | Status: DC | PRN
  Administered 2019-09-12: 25 mg
  Administered 2019-09-12: 15 mg

## 2019-09-12 MED ORDER — LIDOCAINE VISCOUS HCL 2 % MT SOLN
OROMUCOSAL | Status: AC
  Filled 2019-09-12: qty 15

## 2019-09-12 MED ORDER — PROPRANOLOL HCL 20 MG PO TABS
20.0000 mg | ORAL_TABLET | Freq: Three times a day (TID) | ORAL | Status: DC
  Administered 2019-09-12 – 2019-09-18 (×15): 20 mg via ORAL
  Filled 2019-09-12 (×18): qty 1

## 2019-09-12 MED ORDER — SODIUM CHLORIDE 0.9 % IV SOLN: INTRAVENOUS | Status: DC

## 2019-09-12 MED ORDER — HEPARIN (PORCINE) 25000 UT/250ML-% IV SOLN
2350.0000 [IU]/h | INTRAVENOUS | Status: DC
  Administered 2019-09-12: 1600 [IU]/h via INTRAVENOUS
  Administered 2019-09-13: 2150 [IU]/h via INTRAVENOUS
  Administered 2019-09-13: 1850 [IU]/h via INTRAVENOUS
  Administered 2019-09-14 – 2019-09-15 (×3): 2350 [IU]/h via INTRAVENOUS
  Filled 2019-09-12 (×6): qty 250

## 2019-09-12 MED ORDER — ONDANSETRON HCL 4 MG/2ML IJ SOLN
INTRAMUSCULAR | Status: DC | PRN
  Administered 2019-09-12: 4 mg via INTRAVENOUS

## 2019-09-12 MED ORDER — HEPARIN BOLUS VIA INFUSION
4000.0000 [IU] | Freq: Once | INTRAVENOUS | Status: AC
  Administered 2019-09-12: 4000 [IU] via INTRAVENOUS
  Filled 2019-09-12: qty 4000

## 2019-09-12 MED ORDER — HEPARIN BOLUS VIA INFUSION
2000.0000 [IU] | Freq: Once | INTRAVENOUS | Status: AC
  Administered 2019-09-12: 2000 [IU] via INTRAVENOUS
  Filled 2019-09-12: qty 2000

## 2019-09-12 MED ORDER — MIDAZOLAM HCL 5 MG/5ML IJ SOLN
INTRAMUSCULAR | Status: DC | PRN
  Administered 2019-09-12: 1 mg via INTRAVENOUS
  Administered 2019-09-12: 2 mg via INTRAVENOUS

## 2019-09-12 MED ORDER — ONDANSETRON HCL 4 MG/2ML IJ SOLN
INTRAMUSCULAR | Status: AC
  Filled 2019-09-12: qty 2

## 2019-09-12 MED ORDER — LIDOCAINE VISCOUS HCL 2 % MT SOLN
OROMUCOSAL | Status: DC | PRN
  Administered 2019-09-12: 1 via OROMUCOSAL

## 2019-09-12 MED ORDER — MIDAZOLAM HCL 5 MG/5ML IJ SOLN
INTRAMUSCULAR | Status: AC
  Filled 2019-09-12: qty 10

## 2019-09-12 NOTE — Progress Notes (Addendum)
ANTICOAGULATION CONSULT NOTE - Initial Consult  Pharmacy Consult for:  heparin dosing Indication: portal vein thrombosis  No Known Allergies  Patient Measurements: Height: 5' 9"  (175.3 cm) Weight: 132.4 kg (291 lb 14.2 oz) IBW/kg (Calculated) : 70.7 Heparin Dosing Weight:  HEPARIN DW (KG): 104.7  Vital Signs: Temp: 98.8 F (37.1 C) (04/09 1227) Temp Source: Oral (04/09 1227) BP: 129/85 (04/09 1344) Pulse Rate: 102 (04/09 1344)  Labs: Recent Labs    09/10/19 1025 09/10/19 1526 09/11/19 0535  HGB 12.1*  --  12.1*  HCT 37.5*  --  38.4*  PLT 111*  --  106*  LABPROT 15.1 15.2  --   INR 1.2 1.2  --   CREATININE 0.72  --  0.72    Estimated Creatinine Clearance: 159 mL/min (by C-G formula based on SCr of 0.72 mg/dL).  Medical History: Past Medical History:  Diagnosis Date  . Hypertension   . Liver cirrhosis ([place])   . Neuropathy     Assessment: Pharmacy consulted to dose heparin for this 45 yo male with portal vein thrombosis.  Upper endoscopy showed no acute bleeding at this time. Patient has low baseline platelets of 106K, but patient has a history of hepatic cirrhosis.  He wasn't on anti-coagulation prior to admission.  Goal of Therapy:  Heparin level 0.3-0.7 units/ml Monitor platelets by anticoagulation protocol: Yes   Plan:  Give 4000 units bolus x 1 Start heparin infusion at 1600 units/hr Check anti-Xa level in 6-8 hours and daily while on heparin Continue to monitor H&H and platelets  Despina Pole 09/12/2019,2:46 PM

## 2019-09-12 NOTE — Op Note (Signed)
Alejandro Porter Patient Name: Alejandro Porter Procedure Date: 09/12/2019 12:54 PM MRN: [id number] Date of Birth: 09-28-1975 Attending MD: Norvel Richards , MD CSN: [id number] Age: 44 Admit Type: Inpatient Procedure:                Upper GI endoscopy Indications:              Screening procedure, Dysphagia Providers:                Norvel Richards, MD, Janeece Riggers, RN, Randa Spike, Technician Referring MD:              Medicines:                Midazolam 3 mg IV, Meperidine 40 mg IV Complications:            No immediate complications. Estimated Blood Loss:     Estimated blood loss was minimal. Procedure:                Pre-Anesthesia Assessment:                           - Prior to the procedure, a History and Physical                            was performed, and patient medications and                            allergies were reviewed. The patient's tolerance of                            previous anesthesia was also reviewed. The risks                            and benefits of the procedure and the sedation                            options and risks were discussed with the patient.                            All questions were answered, and informed consent                            was obtained. Prior Anticoagulants: The patient has                            taken no previous anticoagulant or antiplatelet                            agents. ASA Grade Assessment: III - A patient with                            severe systemic disease. After reviewing the risks  and benefits, the patient was deemed in                            satisfactory condition to undergo the procedure.                           After obtaining informed consent, the endoscope was                            passed under direct vision. Throughout the                            procedure, the patient's blood pressure, pulse, and               oxygen saturations were monitored continuously. The                            GIF-H190 ([id number]) scope was introduced through the                            mouth, and advanced to the second part of duodenum.                            The upper GI endoscopy was accomplished without                            difficulty. The patient tolerated the procedure                            well. Scope In: 1:19:56 PM Scope Out: 1:32:13 PM Total Procedure Duration: 0 hours 12 minutes 17 seconds  Findings:      4 columns of barely discernible grade 1 esophageal varices coming up to       10 cm and distal esophagus. 2 short columns of grade 2 esophageal       varices just above the GE junction -approximately 2 cm in length. No       bleeding stigmata. Schatzki's ring present. Esophageal erosions within 5       mm of the GE junction also noted. No Barrett's epithelium seen.      A small hiatal hernia was present.      Moderate portal hypertensive gastropathy was found in the entire       examined stomach. Scattered gastric erosions. No ulcer, infiltrating       process or gastric varices seen. 8 mm gastric polyp noted on the gastric       body. Patent pylorus.      The duodenal bulb and second portion of the duodenum were normal.      Scope was withdrawn and a 56 French Maloney dilator was passed and full       insertion with mild resistance. A look back revealed no apparent       complication related to this maneuver. No bleeding. Subsequently,       biopsies the abnormal appearing gastric mucosa were taken. Finally,       removal of the 8 mm polyp done subsequent to gastric mucosal biopsy cold  biopsy forceps. Polypectomy site using treated with hemostasis clip x1 Impression:               - Small hiatal hernia. Mild erosive reflux                            esophagitis. Schatzki's ring - status post dilation.                           - Small grade 1/grade 2 esophageal varices  without                            bleeding stigmata                           Portal hypertensive gastropathy. Gastric                            erosions?"status post biopsy. Single gastric                            polyp?"status post biopsy/removal -clipped x1                           - Normal duodenal bulb and second portion of the                            duodenum. Moderate Sedation:      Moderate (conscious) sedation was administered by the endoscopy nurse       and supervised by the endoscopist. The following parameters were       monitored: oxygen saturation, heart rate, blood pressure, respiratory       rate, EKG, adequacy of pulmonary ventilation, and response to care.       Total physician intraservice time was 20 minutes. Recommendation:           - Return patient to hospital ward for ongoing care.                           - Clear liquid diet. Daily PPI. Follow-up on                            pathology. Hematology consultation regarding                            recommendations for anticoagulation. Further                            recommendations to follow. At patient request, I                            called father at [phone]. Unable to reach.                           -No MRI until clips gone Procedure Code(s):        --- Professional ---  50037, Esophagogastroduodenoscopy, flexible,                            transoral; diagnostic, including collection of                            specimen(s) by brushing or washing, when performed                            (separate procedure)                           G0500, Moderate sedation services provided by the                            same physician or other qualified health care                            professional performing a gastrointestinal                            endoscopic service that sedation supports,                            requiring the presence of an independent  trained                            observer to assist in the monitoring of the                            patient's level of consciousness and physiological                            status; initial 15 minutes of intra-service time;                            patient age 44 years or older (additional time may                            be reported with [phone], as appropriate) Diagnosis Code(s):        --- Professional ---                           K44.9, Diaphragmatic hernia without obstruction or                            gangrene                           K76.6, Portal hypertension                           K31.89, Other diseases of stomach and duodenum                           Z13.810, Encounter for screening for upper  gastrointestinal disorder                           R13.10, Dysphagia, unspecified CPT copyright 2019 [employer]. All rights reserved. The codes documented in this report are preliminary and upon coder review may  be revised to meet current compliance requirements. Cristopher Estimable. Veasna Santibanez, MD Norvel Richards, MD 09/12/2019 1:44:18 PM This report has been signed electronically. Number of Addenda: 0

## 2019-09-12 NOTE — Progress Notes (Addendum)
Patient Demographics:    Alejandro Porter, is a 44 y.o. male, DOB - 1975/08/22, ZOX:[id number]  Admit date - 09/10/2019   Admitting Physician Orson Eva, MD  Outpatient Primary MD for the patient is [facility], [facility]  LOS - 2   Chief Complaint  Patient presents with  . Ascites        Subjective:    Alejandro Porter today has no fevers, no emesis,  No chest pain,    -Tolerated EGD well, hungry and wants to eat -GI service recommends clear liquid diet  Assessment  & Plan :    Active Problems:   Cirrhosis of liver with ascites (HCC)   Portal vein thrombosis  Brief summary 44 y.o. 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 Child Pugh B. -s/p paracentesis on 09/10/2019 with removal of 8 L -Patient failed outpatient diuresis with oral agents, continue IV Lasix and p.o. Aldactone -GI consult appreciated  2)Portal vein thrombosis, nonocclusive, extending into right intrahepatic portal vein, first noted at time of CT 08/16/20---  EGD showed grade 1 and grade 2 esophageal varices without stigmata of bleeding -Discussed with Dr. Delton Coombes from hematology -GI service also recommends anticoagulation will start IV heparin -Monitor platelets and H&H, monitor for bleeding  3)H/o COVID 19 infection--diagnosed 08/25/2019, repeat Covid test negative on 09/10/2019 -Asymptomatic,   4)HTN--stop metoprolol, use propanolol due to moderate portal hypertension   Disposition/Need for in-Hospital Stay- patient unable to be discharged at this time due  to ---decompensated liver cirrhosis requiring aggressive iv diuresis and portal vein thrombosis requiring IV heparin -Patient From: Home D/C Place: Home Barriers: Not Clinically Stable- --Patient failed outpatient diuresis with oral agents, continue IV Lasix and IV heparin  Code Status : Full code  Family Communication:   (patient is alert, awake and coherent) -Sister at bedside  Consults  :  GI/phone consult with Dr. Delton Coombes from hematology  Procedures:- 1) paracentesis on 09/10/2019 with removal of 8 L 2)EGD on 09/12/2019- Small hiatal hernia. Mild erosive reflux esophagitis. Schatzki's ring - status post dilation   - Small grade 1/grade 2 esophageal varices without  bleeding stigmata - Portal hypertensive gastropathy  DVT Prophylaxis  : - SCDs   Lab Results  Component Value Date   PLT 106 (L) 09/11/2019   Inpatient Medications  Scheduled Meds: . furosemide  40 mg Intravenous BID  . gabapentin  300 mg Oral QHS  . lidocaine (PF)  5 mL Intradermal Once  . lidocaine      . meperidine      . metoprolol succinate  50 mg Oral Daily  . midazolam      . ondansetron      . polyethylene glycol  17 g Oral Daily  . spironolactone  100 mg Oral Q breakfast   Continuous Infusions: . albumin human 25 g (09/12/19 1810)  . heparin 1,600 Units/hr (09/12/19 1516)   PRN Meds:.  Anti-infectives (From admission, onward)   None  Objective:   Vitals:   09/12/19 1330 09/12/19 1335 09/12/19 1344 09/12/19 1609  BP:   129/85 109/70  Pulse:   (!) 102 90  Resp:    20  Temp:    98.1 F (36.7 C)  TempSrc:    Oral  SpO2: 90% 91% 93% 94%  Weight:      Height:        Wt Readings from Last 3 Encounters:  09/12/19 132.4 kg  09/10/19 (!) 142.9 kg  08/25/19 133.8 kg     Intake/Output Summary (Last 24 hours) at 09/12/2019 1827 Last data filed at 09/12/2019 1517 Gross per 24 hour  Intake 300.09 ml  Output --  Net 300.09 ml     Physical Exam  Gen:- Awake Alert, no acute  distress HEENT:- [place].AT,   Neck-Supple Neck,No JVD,.  Lungs-  CTAB , fair symmetrical air movement CV- S1, S2 normal, regular  Abd-diminished bowel sounds, no significant tenderness, ascites with fluid thrill noted,    Extremity/Skin:- No  edema, pedal pulses present  Psych-affect is appropriate, oriented x3 Neuro-generalized weakness, no new focal deficits, no tremors   Data Review:   Micro Results Recent Results (from the past 240 hour(s))  Culture, body fluid-bottle     Status: None (Preliminary result)   Collection Time: 09/10/19 10:27 AM   Specimen: Ascitic  Result Value Ref Range Status   Specimen Description ASCITIC  Final   Special Requests BOTTLES DRAWN AEROBIC AND ANAEROBIC 10CC  Final   Culture   Final    NO GROWTH 2 DAYS Performed at [facility], [address], [place], [place] [address]    Report Status PENDING  Incomplete  Gram stain     Status: None   Collection Time: 09/10/19 10:27 AM   Specimen: Ascitic  Result Value Ref Range Status   Specimen Description ASCITIC  Final   Special Requests NONE  Final   Gram Stain   Final    NO ORGANISMS SEEN WBC PRESENT, PREDOMINANTLY MONONUCLEAR CYTOSPIN SMEAR Performed at [facility], [address] [place], [place] [address]    Report Status 09/10/2019 FINAL  Final  SARS CORONAVIRUS 2 (TAT 6-24 HRS) Nasopharyngeal Nasopharyngeal Swab     Status: None   Collection Time: 09/10/19 12:09 PM   Specimen: Nasopharyngeal Swab  Result Value Ref Range Status   SARS Coronavirus 2 NEGATIVE NEGATIVE Final    Comment: (NOTE) SARS-CoV-2 target nucleic acids are NOT DETECTED. The SARS-CoV-2 RNA is generally detectable in upper and lower respiratory specimens during the acute phase of infection. Negative results do not preclude SARS-CoV-2 infection, do not rule out co-infections with other pathogens, and should not be used as the sole basis for treatment or other patient management decisions. Negative results must be combined  with clinical observations, patient history, and epidemiological information. The expected result is Negative. Fact Sheet for Patients: SugarRoll.be Fact Sheet for Healthcare Providers: https://www.woods-mathews.com/ This test is not yet approved or cleared by the [place] FDA and  has been authorized for detection and/or diagnosis of SARS-CoV-2 by FDA under an Emergency Use Authorization (EUA). This EUA will remain  in effect (meaning this test can be used) for the duration of the COVID-19 declaration under Section 56 4(b)(1) of the Act, 21 U.S.C. section 360bbb-3(b)(1), unless the authorization is terminated or revoked sooner. Performed at [facility], [place] [address], [place], [place] [address]     Radiology Reports DG Chest [place] 1 View  Result Date: 09/10/2019 CLINICAL DATA:  Shortness of breath. EXAM: PORTABLE CHEST 1 VIEW COMPARISON:  August 25, 2019. FINDINGS: Stable cardiomediastinal silhouette. No pneumothorax is noted. Increased left basilar atelectasis or infiltrate is noted. Minimal right basilar subsegmental atelectasis or infiltrate is noted. Bony thorax is unremarkable. IMPRESSION: Increased bibasilar subsegmental atelectasis or infiltrates, left greater than right. Electronically Signed   By: Marijo Conception M.D.   On: 09/10/2019 10:14   DG Chest Portable 1 View  Result Date: 08/25/2019 CLINICAL DATA:  Shortness of breath. Abdominal swelling and leg swelling. EXAM: PORTABLE CHEST 1 VIEW COMPARISON:  08/17/2019 FINDINGS: The heart size is within normal limits. Slight pulmonary vascular prominence. Minimal atelectasis at the left lung base. No pulmonary edema or visible pleural effusions. No bone abnormality. IMPRESSION: Slight pulmonary vascular prominence. Electronically Signed   By: Lorriane Shire M.D.   On: 08/25/2019 13:12   ECHOCARDIOGRAM COMPLETE  Result Date: 09/10/2019    ECHOCARDIOGRAM REPORT   Patient Name:   EMMAUEL HALLUMS Date of Exam: 09/10/2019 Medical Rec #:  [id number]       Height:       69.0 in Accession #:    [id number]      Weight:       315.0 lb Date of Birth:  11-29-1975        BSA:          2.506 m Patient Age:    57 years        BP:           134/94 mmHg Patient Gender: M               HR:           92 bpm. Exam Location:  [facility] Procedure: 2D Echo Indications:    Dyspnea 786.09 / R06.00  History:        Patient has no prior history of Echocardiogram examinations.                 Risk Factors:Non-Smoker and Hypertension. Edema, Portal vein                 thrombosis, History of COVID 19.  Sonographer:    Leavy Cella RDCS (AE) Referring Phys: [phone] DAVID TAT IMPRESSIONS  1. Left ventricular ejection fraction, by estimation, is 60 to 65%. The left ventricle has normal function. The left ventricle has no regional wall motion abnormalities. Left ventricular diastolic parameters are indeterminate.  2. Right ventricular systolic function is normal. The right ventricular size is normal.  3. The mitral valve was not well visualized. No evidence of mitral valve regurgitation. No evidence of mitral stenosis.  4. The aortic valve was not well visualized. Aortic valve regurgitation is not visualized. No aortic stenosis is present. FINDINGS  Left Ventricle: Left ventricular ejection fraction, by estimation, is 60 to 65%. The left ventricle has normal function. The left ventricle has no regional wall motion abnormalities. The left ventricular internal cavity size was normal in size. There is  no left ventricular hypertrophy. Left ventricular diastolic parameters are indeterminate. Right Ventricle: The right ventricular size is normal. No increase in right ventricular wall thickness. Right ventricular systolic function is normal. Left Atrium: Left atrial size was normal in size. Right Atrium: Right atrial size was normal in size. Pericardium: There is no evidence of pericardial effusion. Mitral Valve: The mitral valve was  not well visualized. No evidence of mitral valve regurgitation. No evidence of mitral valve stenosis. Tricuspid Valve: The tricuspid valve is normal  in structure. Tricuspid valve regurgitation is not demonstrated. No evidence of tricuspid stenosis. Aortic Valve: The aortic valve was not well visualized. Aortic valve regurgitation is not visualized. No aortic stenosis is present. Aortic valve mean gradient measures 5.2 mmHg. Aortic valve peak gradient measures 9.3 mmHg. Aortic valve area, by VTI measures 2.81 cm. Pulmonic Valve: The pulmonic valve was not well visualized. Pulmonic valve regurgitation is not visualized. No evidence of pulmonic stenosis. Aorta: The aortic root was not well visualized. Pulmonary Artery: Indeterminate PASP, inadequate TR jet. Venous: The inferior vena cava was not well visualized. IAS/Shunts: The interatrial septum was not well visualized. Additional Comments: Mild ascites is present.  LEFT VENTRICLE PLAX 2D LVIDd:         4.96 cm  Diastology LVIDs:         3.02 cm  LV e' lateral:   14.80 cm/s LV PW:         0.90 cm  LV E/e' lateral: 3.8 LV IVS:        0.86 cm  LV e' medial:    8.16 cm/s LVOT diam:     2.30 cm  LV E/e' medial:  6.9 LV SV:         86 LV SV Index:   34 LVOT Area:     4.15 cm  RIGHT VENTRICLE RV S prime:     12.60 cm/s TAPSE (M-mode): 2.8 cm LEFT ATRIUM             Index LA diam:        4.30 cm 1.72 cm/m LA Vol (A2C):   49.2 ml 19.63 ml/m LA Vol (A4C):   38.5 ml 15.36 ml/m LA Biplane Vol: 47.3 ml 18.87 ml/m  AORTIC VALVE AV Area (Vmax):    2.62 cm AV Area (Vmean):   2.50 cm AV Area (VTI):     2.81 cm AV Vmax:           152.23 cm/s AV Vmean:          107.685 cm/s AV VTI:            0.307 m AV Peak Grad:      9.3 mmHg AV Mean Grad:      5.2 mmHg LVOT Vmax:         95.98 cm/s LVOT Vmean:        64.882 cm/s LVOT VTI:          0.208 m LVOT/AV VTI ratio: 0.68  AORTA Ao Root diam: 3.40 cm MITRAL VALVE MV Area (PHT): 3.81 cm    SHUNTS MV Decel Time: 199 msec    Systemic  VTI:  0.21 m MV E velocity: 56.10 cm/s  Systemic Diam: 2.30 cm MV A velocity: 54.80 cm/s MV E/A ratio:  1.02 Carlyle Dolly MD Electronically signed by Carlyle Dolly MD Signature Date/Time: 09/10/2019/4:56:54 PM    Final    US LIVER DOPPLER  Result Date: 09/11/2019 CLINICAL DATA:  Cirrhosis, ascites, nonocclusive portal thrombus by CT EXAM: DUPLEX ULTRASOUND OF LIVER TECHNIQUE: Color and duplex Doppler ultrasound was performed to evaluate the hepatic in-flow and out-flow vessels. COMPARISON:  08/17/2019 CT FINDINGS: Liver: Increased echogenicity with surface nodularity compatible with hepatic cirrhosis. No focal lesion, mass or intrahepatic biliary ductal dilatation. Main Portal Vein size: 2.7 cm Portal Vein Velocities Main Prox:  35 cm/sec Main Mid: 39 cm/sec Main Dist:  33 cm/sec Right: 26 cm/sec Left: 23 cm/sec Hepatic Vein Velocities Right:  40 cm/sec Middle:  27 cm/sec Left:  46 cm/sec IVC: Present and patent with normal respiratory phasicity. Hepatic Artery Velocity:  37 cm/sec Splenic Vein Velocity:  18 cm/sec Spleen: 18 cm x 5.5 cm x 22 cm with a total volume of 1163 cm^3 (411 cm^3 is upper limit normal) Portal Vein Occlusion/Thrombus: Nonocclusive hypoechoic thrombus along the main portal vein wall posteriorly extending into the right portal vein. This correlates with the CT. Splenic Vein Occlusion/Thrombus: No Ascites: Moderate ascites present. Varices: None IMPRESSION: Nonocclusive portal vein thrombus within the main portal vein extending into the right portal vein. Hepatopetal flow is maintained. Hepatic cirrhosis and ascites.  Associated splenomegaly. Electronically Signed   By: Jerilynn Mages.  Shick M.D.   On: 09/11/2019 16:02     CBC Recent Labs  Lab 09/10/19 1025 09/11/19 0535  WBC 6.7 5.8  HGB 12.1* 12.1*  HCT 37.5* 38.4*  PLT 111* 106*  MCV 92.1 93.4  MCH 29.7 29.4  MCHC 32.3 31.5  RDW 14.2 14.3  LYMPHSABS 0.6*  --   MONOABS 0.7  --   EOSABS 0.2  --   BASOSABS 0.0  --     Chemistries   Recent Labs  Lab 09/10/19 1025 09/11/19 0535  NA 137 139  K 3.6 4.0  CL 102 104  CO2 27 30  GLUCOSE 103* 88  BUN 13 13  CREATININE 0.72 0.72  CALCIUM 8.3* 8.3*  AST 23 23  ALT 17 16  ALKPHOS 80 70  BILITOT 0.7 1.3*   ------------------------------------------------------------------------------------------------------------------ No results for input(s): CHOL, HDL, LDLCALC, TRIG, CHOLHDL, LDLDIRECT in the last 72 hours.  No results found for: HGBA1C ------------------------------------------------------------------------------------------------------------------ No results for input(s): TSH, T4TOTAL, T3FREE, THYROIDAB in the last 72 hours.  Invalid input(s): FREET3 ------------------------------------------------------------------------------------------------------------------ Recent Labs    09/10/19 1526  FERRITIN 193    Coagulation profile Recent Labs  Lab 09/10/19 1025 09/10/19 1526  INR 1.2 1.2    No results for input(s): DDIMER in the last 72 hours.  Cardiac Enzymes No results for input(s): CKMB, TROPONINI, MYOGLOBIN in the last 168 hours.  Invalid input(s): CK ------------------------------------------------------------------------------------------------------------------    Component Value Date/Time   BNP 44.0 08/25/2019 1329     Brunette Lavalle M.D on 09/12/2019 at 6:27 PM  Go to [email or web address] - for contact info  [facility] - Office  [phone]

## 2019-09-12 NOTE — Progress Notes (Signed)
Patient briefly seen in preparation for EGD today. No concerns. Still with 2-3+ pitting edema to the knees bilaterally. Lungs CTA. Heart RRR. Abdomen distended but not tense. Weight down 25 pounds this admission.  Plan for EGD to screen for varices.  I have discussed the risks, alternatives, benefits with regards to but not limited to the risk of reaction to medication, bleeding, infection, perforation and the patient is agreeable to proceed. Written consent to be obtained.  Laureen Ochs. Bernarda Caffey [facility] [phone] 4/9/20219:47 AM

## 2019-09-12 NOTE — Interval H&P Note (Signed)
History and Physical Interval Note:  09/12/2019 1:09 PM  Alejandro Porter  has presented today for surgery, with the diagnosis of portal vein thrombosis, need to screen for esophageal varices prior to anticoagulation, cirrhosis.  The various methods of treatment have been discussed with the patient and family. After consideration of risks, benefits and other options for treatment, the patient has consented to  Procedure(s): ESOPHAGOGASTRODUODENOSCOPY (EGD) (N/A) ESOPHAGEAL BANDING (N/A) as a surgical intervention.  The patient's history has been reviewed, patient examined, no change in status, stable for surgery.  I have reviewed the patient's chart and labs.  Questions were answered to the patient's satisfaction.     Manus Rudd  Patient seen and examined in the endoscopy unit.  Relates intermittent esophageal dysphagia to solids-chronically.  Agree with need for EGD to assess for varices.  I explained to patient he may or may not be able to dilate his esophagus depending on findings.  It is notable he denies any formal reflux. Offer the patient an EGD today with possible esophageal dilation as feasible/appropriate per plan. The risks, benefits, limitations, alternatives and imponderables have been reviewed with the patient. Potential for esophageal dilation, biopsy, etc. have also been reviewed.  Questions have been answered. All parties agreeable.

## 2019-09-12 NOTE — Progress Notes (Addendum)
ANTICOAGULATION CONSULT NOTE - Initial Consult  Pharmacy Consult for:  heparin dosing Indication: portal vein thrombosis  No Known Allergies  Patient Measurements: Height: 5' 9"  (175.3 cm) Weight: 132.4 kg (291 lb 14.2 oz) IBW/kg (Calculated) : 70.7 Heparin Dosing Weight:  HEPARIN DW (KG): 104.7  Vital Signs: Temp: 98.1 F (36.7 C) (04/09 1609) Temp Source: Oral (04/09 1609) BP: 109/70 (04/09 1609) Pulse Rate: 90 (04/09 1609)  Labs: Recent Labs    09/10/19 1025 09/10/19 1526 09/11/19 0535 09/12/19 1447 09/12/19 2054  HGB 12.1*  --  12.1*  --   --   HCT 37.5*  --  38.4*  --   --   PLT 111*  --  106*  --   --   APTT  --   --   --  36  --   LABPROT 15.1 15.2  --   --   --   INR 1.2 1.2  --   --   --   HEPARINUNFRC  --   --   --   --  0.13*  CREATININE 0.72  --  0.72  --   --     Estimated Creatinine Clearance: 159 mL/min (by C-G formula based on SCr of 0.72 mg/dL).  Medical History: Past Medical History:  Diagnosis Date  . Hypertension   . Liver cirrhosis ([place])   . Neuropathy     Assessment: Pharmacy consulted to dose heparin for this 44 yo male with portal vein thrombosis.  Upper endoscopy showed no acute bleeding at this time. Patient has low baseline platelets of 106K, but patient has a history of hepatic cirrhosis.  He wasn't on anti-coagulation prior to admission.  Heparin level came back at 0.13 tonight. We will increase the dose of heparin and recheck level in AM.   Goal of Therapy:  Heparin level 0.3-0.7 units/ml Monitor platelets by anticoagulation protocol: Yes   Plan:  Heparin bolus 2000 units x1 Increase heparin to 1900 units/hr F/u with AM HL and CBC  Onnie Boer, PharmD, BCIDP, AAHIVP, CPP Infectious Disease Pharmacist 09/12/2019 9:30 PM

## 2019-09-13 DIAGNOSIS — R188 Other ascites: Secondary | ICD-10-CM

## 2019-09-13 DIAGNOSIS — K7460 Unspecified cirrhosis of liver: Secondary | ICD-10-CM

## 2019-09-13 DIAGNOSIS — I81 Portal vein thrombosis: Secondary | ICD-10-CM

## 2019-09-13 DIAGNOSIS — R601 Generalized edema: Secondary | ICD-10-CM

## 2019-09-13 LAB — CBC
HCT: 34.1 % — ABNORMAL LOW (ref 39.0–52.0)
Hemoglobin: 10.5 g/dL — ABNORMAL LOW (ref 13.0–17.0)
MCH: 29.2 pg (ref 26.0–34.0)
MCHC: 30.8 g/dL (ref 30.0–36.0)
MCV: 94.7 fL (ref 80.0–100.0)
Platelets: 90 10*3/uL — ABNORMAL LOW (ref 150–400)
RBC: 3.6 MIL/uL — ABNORMAL LOW (ref 4.22–5.81)
RDW: 13.8 % (ref 11.5–15.5)
WBC: 4.4 10*3/uL (ref 4.0–10.5)
nRBC: 0 % (ref 0.0–0.2)

## 2019-09-13 LAB — COMPREHENSIVE METABOLIC PANEL
ALT: 12 U/L (ref 0–44)
AST: 18 U/L (ref 15–41)
Albumin: 2.7 g/dL — ABNORMAL LOW (ref 3.5–5.0)
Alkaline Phosphatase: 53 U/L (ref 38–126)
Anion gap: 6 (ref 5–15)
BUN: 12 mg/dL (ref 6–20)
CO2: 31 mmol/L (ref 22–32)
Calcium: 8.5 mg/dL — ABNORMAL LOW (ref 8.9–10.3)
Chloride: 101 mmol/L (ref 98–111)
Creatinine, Ser: 0.7 mg/dL (ref 0.61–1.24)
GFR calc Af Amer: >60 mL/min (ref >60)
GFR calc non Af Amer: >60 mL/min (ref >60)
Glucose, Bld: 78 mg/dL (ref 70–99)
Potassium: 4.4 mmol/L (ref 3.5–5.1)
Sodium: 138 mmol/L (ref 135–145)
Total Bilirubin: 1.1 mg/dL (ref 0.3–1.2)
Total Protein: 5.9 g/dL — ABNORMAL LOW (ref 6.5–8.1)

## 2019-09-13 LAB — MITOCHONDRIAL ANTIBODIES: Mitochondrial M2 Ab, IgG: <20 Units (ref 0.0–20.0)

## 2019-09-13 LAB — HEPARIN LEVEL (UNFRACTIONATED)
Heparin Unfractionated: 0.17 IU/mL — ABNORMAL LOW (ref 0.30–0.70)
Heparin Unfractionated: 0.39 IU/mL (ref 0.30–0.70)
Heparin Unfractionated: 0.24 IU/mL — ABNORMAL LOW (ref 0.30–0.70)

## 2019-09-13 LAB — SODIUM, URINE, RANDOM: Sodium, Ur: 23 mmol/L

## 2019-09-13 LAB — ANTI-SMOOTH MUSCLE ANTIBODY, IGG: F-Actin IgG: 11 Units (ref 0–19)

## 2019-09-13 MED ORDER — HEPARIN BOLUS VIA INFUSION
2000.0000 [IU] | Freq: Once | INTRAVENOUS | Status: AC
  Administered 2019-09-13: 2000 [IU] via INTRAVENOUS
  Filled 2019-09-13: qty 2000

## 2019-09-13 MED ORDER — HEPARIN BOLUS VIA INFUSION
3000.0000 [IU] | Freq: Once | INTRAVENOUS | Status: AC
  Administered 2019-09-13: 3000 [IU] via INTRAVENOUS
  Filled 2019-09-13: qty 3000

## 2019-09-13 NOTE — Progress Notes (Signed)
ANTICOAGULATION CONSULT NOTE  Pharmacy Consult for Heparin dosing Indication: portal vein thrombosis  No Known Allergies  Patient Measurements: Height: 5' 9"  (175.3 cm) Weight: 131.7 kg (290 lb 5.5 oz) IBW/kg (Calculated) : 70.7 Heparin Dosing Weight:  HEPARIN DW (KG): 104.7  Vital Signs: Temp: 98.3 F (36.8 C) (04/10 2121) Temp Source: Oral (04/10 2121) BP: 122/79 (04/10 2121) Pulse Rate: 76 (04/10 2121)  Labs: Recent Labs    09/11/19 0535 09/12/19 1447 09/12/19 2054 09/13/19 0551 09/13/19 1444 09/13/19 2219  HGB 12.1*  --   --  10.5*  --   --   HCT 38.4*  --   --  34.1*  --   --   PLT 106*  --   --  90*  --   --   APTT  --  36  --   --   --   --   HEPARINUNFRC  --   --    < > 0.17* 0.39 0.24*  CREATININE 0.72  --   --  0.70  --   --    < > = values in this interval not displayed.    Estimated Creatinine Clearance: 158.5 mL/min (by C-G formula based on SCr of 0.7 mg/dL).  Medical History: Past Medical History:  Diagnosis Date  . Hypertension   . Liver cirrhosis ([place])   . Neuropathy     Assessment: Pharmacy consulted to dose heparin for this 44 yo male with portal vein thrombosis.  Upper endoscopy showed no acute bleeding at this time. Patient has low baseline platelets of 106K, but patient has a history of hepatic cirrhosis.  He wasn't on anti-coagulation prior to admission.  Heparin level sub-therapeutic tonight  Goal of Therapy: .  Heparin level 0.3-0.7 units/ml Monitor platelets by anticoagulation protocol: Yes   Plan:  Heparin 2000 units re-bolus  Inc heparin to 2350 units/hr Re-check heparin level with AM labs  Narda Bonds, PharmD, [place] Pharmacist Phone: [phone]

## 2019-09-13 NOTE — Progress Notes (Signed)
ANTICOAGULATION CONSULT NOTE -   Pharmacy Consult for:  heparin dosing Indication: portal vein thrombosis  No Known Allergies  Patient Measurements: Height: 5' 9"  (175.3 cm) Weight: 131.7 kg (290 lb 5.5 oz) IBW/kg (Calculated) : 70.7 Heparin Dosing Weight:  HEPARIN DW (KG): 104.7  Vital Signs: Temp: 98.1 F (36.7 C) (04/10 0507) Temp Source: Oral (04/10 0507) BP: 106/79 (04/10 0804) Pulse Rate: 67 (04/10 0804)  Labs: Recent Labs    09/11/19 0535 09/12/19 1447 09/12/19 2054 09/13/19 0551 09/13/19 1444  HGB 12.1*  --   --  10.5*  --   HCT 38.4*  --   --  34.1*  --   PLT 106*  --   --  90*  --   APTT  --  36  --   --   --   HEPARINUNFRC  --   --  0.13* 0.17* 0.39  CREATININE 0.72  --   --  0.70  --     Estimated Creatinine Clearance: 158.5 mL/min (by C-G formula based on SCr of 0.7 mg/dL).  Medical History: Past Medical History:  Diagnosis Date  . Hypertension   . Liver cirrhosis ([place])   . Neuropathy     Assessment: Pharmacy consulted to dose heparin for this 44 yo male with portal vein thrombosis.  Upper endoscopy showed no acute bleeding at this time. Patient has low baseline platelets of 106K, but patient has a history of hepatic cirrhosis.  He wasn't on anti-coagulation prior to admission.  Heparin level came back at 0.39- therpeutic  Platelets trending down to 90- 185 on 3/22. Will monitor  Goal of Therapy: .  Heparin level 0.3-0.7 units/ml Monitor platelets by anticoagulation protocol: Yes   Plan:  Continue heparin infusion at 2150 units/hr Heparin level in 6 hours and daily CBC daily  Margot Ables, PharmD Clinical Pharmacist 09/13/2019 4:06 PM

## 2019-09-13 NOTE — Progress Notes (Signed)
Subjective:  Patient states he feels better.  He still passing a lot of urine.  He states he has lost 29 pounds since he was hospitalized.  He states last year he managed to lose 95 pounds and in October 2020 he weighed 240 pounds.  He says he gained close to 65 pounds since then.  He says he was diagnosed with cirrhosis when he was admitted to [facility] with new onset of ascites and lower extremity edema.  No history of elevated transaminases or prior liver disease.  He has never drank alcohol.  Patient states both his parents are diabetic and 2 brothers are in good health.  He states his paternal father possibly had alcoholic cirrhosis.  He states he drank too much alcohol. Patient states that his weight range between 240 and 260 pound for several years until last year when he gained all the weight. He he denies sore throat or chest pain.  He also denies abdominal pain.  His bowels are moving.  He denies melena or rectal bleeding. Patient states he dropped out of school when he was in eighth grade.  He decided not to get a GED.  He states he lives with his father who is 76 years old.  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  Current Medications:  Current Facility-Administered Medications:  .  albumin human 25 % solution 25 g, 25 g, Intravenous, BID, Mahala Menghini, PA-C, Last Rate: 60 mL/hr at 09/13/19 0725, 25 g at 09/13/19 0725 .  furosemide (LASIX) injection 40 mg, 40 mg, Intravenous, BID, Fields, Sandi L, MD, 40 mg at 09/13/19 0806 .  gabapentin (NEURONTIN) capsule 300 mg, 300 mg, Oral, QHS, Tat, David, MD, 300 mg at 09/12/19 2200 .  heparin ADULT infusion 100 units/mL (25000 units/277m sodium chloride 0.45%), 2,150 Units/hr, Intravenous, Continuous, Emokpae, Courage, MD, Last Rate: 21.5 mL/hr at 09/13/19 0835, 2,150 Units/hr at 09/13/19 0835 .  lidocaine (PF) (XYLOCAINE) 1 % injection 5 mL, 5 mL, Intradermal, Once, MNoemi Chapel MD .   polyethylene glycol (MIRALAX / GLYCOLAX) packet 17 g, 17 g, Oral, Daily, LMahala Menghini PA-C, 17 g at 09/13/19 0806 .  propranolol (INDERAL) tablet 20 mg, 20 mg, Oral, TID, Emokpae, Courage, MD, 20 mg at 09/13/19 0806 .  spironolactone (ALDACTONE) tablet 100 mg, 100 mg, Oral, Q breakfast, Fields, Sandi L, MD, 100 mg at 09/13/19 0806  Objective: Blood pressure 106/79, pulse 67, temperature 98.1 F (36.7 C), temperature source Oral, resp. rate 20, height 5' 9"  (1.753 m), weight 131.7 kg, SpO2 100 %. Patient is alert and in no acute distress. He does not have asterixis. No neck masses or thyromegaly noted. Cardiac exam with regular rhythm normal S1 and S2.  No murmur or gallop noted. Auscultation of lungs reveal vesicular breath sounds bilaterally. Abdomen is distended but not tense.  He has edema to abdominal wall around puncture site indicative of infiltration of fluid in abdominal wall.  No organomegaly or masses. He has 3-4+ edema involving both legs.   Labs/studies Results:  CBC Latest Ref Rng & Units 09/13/2019 09/11/2019 09/10/2019  WBC 4.0 - 10.5 K/uL 4.4 5.8 6.7  Hemoglobin 13.0 - 17.0 g/dL 10.5(L) 12.1(L) 12.1(L)  Hematocrit 39.0 - 52.0 % 34.1(L) 38.4(L) 37.5(L)  Platelets 150 - 400 K/uL 90(L) 106(L) 111(L)    CMP Latest Ref Rng & Units 09/13/2019 09/11/2019 09/10/2019  Glucose 70 - 99 mg/dL 78 88 103(H)  BUN 6 -  20 mg/dL 12 13 13   Creatinine 0.61 - 1.24 mg/dL 0.70 0.72 0.72  Sodium 135 - 145 mmol/L 138 139 137  Potassium 3.5 - 5.1 mmol/L 4.4 4.0 3.6  Chloride 98 - 111 mmol/L 101 104 102  CO2 22 - 32 mmol/L 31 30 27   Calcium 8.9 - 10.3 mg/dL 8.5(L) 8.3(L) 8.3(L)  Total Protein 6.5 - 8.1 g/dL 5.9(L) 6.5 7.4  Total Bilirubin 0.3 - 1.2 mg/dL 1.1 1.3(H) 0.7  Alkaline Phos 38 - 126 U/L 53 70 80  AST 15 - 41 U/L 18 23 23   ALT 0 - 44 U/L 12 16 17     Hepatic Function Latest Ref Rng & Units 09/13/2019 09/11/2019 09/10/2019  Total Protein 6.5 - 8.1 g/dL 5.9(L) 6.5 7.4  Albumin 3.5 - 5.0 g/dL  2.7(L) 2.6(L) 2.6(L)  AST 15 - 41 U/L 18 23 23   ALT 0 - 44 U/L 12 16 17   Alk Phosphatase 38 - 126 U/L 53 70 80  Total Bilirubin 0.3 - 1.2 mg/dL 1.1 1.3(H) 0.7      Assessment:  #1.  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  All the other markers are negative.  He will need hepatitis A and B vaccination.  His liver liver disease is complicated by ascites lower extremity edema and portal vein thrombosis.  EGD revealed small esophageal varices he also has thrombocytopenia. #1a.  Fluid overload.  He has massive ascites and lower extremity edema.  He had LVAP few days ago.  He is on IV diuretic along with albumin and he is diuresing.  Renal function remains well-preserved.  He has long way to go. #1b.  Portal vein thrombosis.  His presentation does not suggest acute portal vein thrombosis but given his presentation and his young age it would be reasonable to presume that this is an acute situation and anticoagulation would be appropriate.  Can you read the portal vein thrombosis may improve hepatic function.  Pharmacy is managing anticoagulation.  #2.  Anemia appears to be due to chronic disease.  No evidence of GI bleed.  #4.  Esophageal dysphagia secondary Schatzki's ring.  Patient underwent esophageal dilation yesterday without any untoward effects.   Plan:  Change diet to 2 g sodium diet. Continue IV albumin and IV diuretic. CBC and metabolic 7 in a.m.

## 2019-09-13 NOTE — Progress Notes (Signed)
Patient Demographics:    Alejandro Porter, is a 44 y.o. male, DOB - 07-15-1975, GUR:[id number]  Admit date - 09/10/2019   Admitting Physician Orson Eva, MD  Outpatient Primary MD for the patient is [facility]  LOS - 3   Chief Complaint  Patient presents with  . Ascites        Subjective:    Alejandro Porter today has no fevers, no emesis,  No chest pain,   - Patient's cousin and father at bedside, questions answered   Assessment  & Plan :    Active Problems:   Cirrhosis of liver with ascites (HCC)   Portal vein thrombosis  Brief summary 44 y.o. male with medical history of hypertension and recently diagnosed liver cirrhosis presenting with worsening lower extremity edema and increasing abdominal girth admitted from GI clinic on 09/10/2019 after failing outpatient oral diuretics for anasarca and worsening ascites  A/p 1)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 Child Pugh B. -s/p paracentesis on 09/10/2019 with removal of 8 L -Patient failed outpatient diuresis with oral agents, continue IV Lasix and p.o. Aldactone -GI consult appreciated -Continue IV albumin with IV diuresis -She will need vaccination against hep B and hep A  2)Portal vein thrombosis, nonocclusive, extending into right intrahepatic portal vein, first noted at time of CT 08/16/20---  EGD showed grade 1 and grade 2 esophageal varices without stigmata of bleeding -Discussed with Dr. Delton Coombes from hematology -GI service also recommends anticoagulation ---c/n IV heparin, plan to transition to p.o. Eliquis if no bleeding and if platelet counts are stable -For now platelet counts appear to be drifting down -Platelets were 185 on 08/25/2019,  -Platelets down to 90 K (platelet counts were already drifting down prior to initiation  of IV heparin on 09/12/2019 -Monitor platelets and H&H, monitor for bleeding -Risk versus benefit of full anticoagulation discussed with patient, his father and his cousin at bedside----they have decided to go ahead with full anticoagulation  3)H/o COVID 19 infection--diagnosed 08/25/2019, repeat Covid test negative on 09/10/2019 -Asymptomatic,   4)HTN--stopped metoprolol, use propanolol due to moderate portal hypertension  5)Esophageal dysphagia secondary Schatzki's ring.  Patient underwent esophageal dilation on 09/12/2019   Disposition/Need for in-Hospital Stay- patient unable to be discharged at this time due to ---decompensated liver cirrhosis requiring aggressive iv diuresis and portal vein thrombosis requiring IV heparin -Patient From: Home D/C Place: Home Barriers: Not Clinically Stable- --Patient failed outpatient diuresis with oral agents, continue IV Lasix and IV heparin  Code Status : Full code  Family Communication:   (patient is alert, awake and coherent) Cousin and father at bedside  Consults  :  GI/phone consult with Dr. Delton Coombes from hematology  Procedures:- 1) paracentesis on 09/10/2019 with removal of 8 L 2)EGD on 09/12/2019- Small hiatal hernia. Mild erosive reflux esophagitis. Schatzki's ring - status post dilation   - Small grade 1/grade 2 esophageal varices without  bleeding stigmata - Portal hypertensive gastropathy  DVT Prophylaxis  : - SCDs   Lab Results  Component Value Date   PLT 90 (L) 09/13/2019   Inpatient Medications  Scheduled Meds: . furosemide  40 mg Intravenous BID  . gabapentin  300 mg Oral QHS  .  lidocaine (PF)  5 mL Intradermal Once  . polyethylene glycol  17 g Oral Daily  . propranolol  20 mg Oral TID  . spironolactone  100 mg Oral Q breakfast   Continuous Infusions: . heparin 2,150 Units/hr (09/13/19 1444)   PRN Meds:.  Anti-infectives (From admission, onward)   None       Objective:   Vitals:   09/13/19 0349 09/13/19 0507  09/13/19 0804 09/13/19 1614  BP:  102/65 106/79 111/77  Pulse:  79 67 81  Resp:  20    Temp:  98.1 F (36.7 C)    TempSrc:  Oral    SpO2:  97% 100%   Weight: 131.7 kg     Height:        Wt Readings from Last 3 Encounters:  09/13/19 131.7 kg  09/10/19 (!) 142.9 kg  08/25/19 133.8 kg     Intake/Output Summary (Last 24 hours) at 09/13/2019 1927 Last data filed at 09/13/2019 1802 Gross per 24 hour  Intake 960 ml  Output --  Net 960 ml    Physical Exam  Gen:- Awake Alert, no acute distress HEENT:- [place].AT,   Neck-Supple Neck,No JVD,.  Lungs-  CTAB , fair symmetrical air movement CV- S1, S2 normal, regular  Abd-diminished bowel sounds, no significant tenderness, ascites with fluid thrill noted,    Extremity/Skin:-   pedal pulses present  Psych-affect is appropriate, oriented x3 Neuro-generalized weakness, no new focal deficits, no tremors   Data Review:   Micro Results Recent Results (from the past 240 hour(s))  Culture, body fluid-bottle     Status: None (Preliminary result)   Collection Time: 09/10/19 10:27 AM   Specimen: Ascitic  Result Value Ref Range Status   Specimen Description ASCITIC  Final   Special Requests BOTTLES DRAWN AEROBIC AND ANAEROBIC 10CC  Final   Culture   Final    NO GROWTH 3 DAYS Performed at [facility], [address], [place], [place] [address]    Report Status PENDING  Incomplete  Gram stain     Status: None   Collection Time: 09/10/19 10:27 AM   Specimen: Ascitic  Result Value Ref Range Status   Specimen Description ASCITIC  Final   Special Requests NONE  Final   Gram Stain   Final    NO ORGANISMS SEEN WBC PRESENT, PREDOMINANTLY MONONUCLEAR CYTOSPIN SMEAR Performed at [facility], [address], [place], [place] [address]    Report Status 09/10/2019 FINAL  Final  SARS CORONAVIRUS 2 (TAT 6-24 HRS) Nasopharyngeal Nasopharyngeal Swab     Status: None   Collection Time: 09/10/19 12:09 PM   Specimen: Nasopharyngeal Swab  Result Value  Ref Range Status   SARS Coronavirus 2 NEGATIVE NEGATIVE Final    Comment: (NOTE) SARS-CoV-2 target nucleic acids are NOT DETECTED. The SARS-CoV-2 RNA is generally detectable in upper and lower respiratory specimens during the acute phase of infection. Negative results do not preclude SARS-CoV-2 infection, do not rule out co-infections with other pathogens, and should not be used as the sole basis for treatment or other patient management decisions. Negative results must be combined with clinical observations, patient history, and epidemiological information. The expected result is Negative. Fact Sheet for Patients: SugarRoll.be Fact Sheet for Healthcare Providers: https://www.woods-mathews.com/ This test is not yet approved or cleared by the [place] FDA and  has been authorized for detection and/or diagnosis of SARS-CoV-2 by FDA under an Emergency Use Authorization (EUA). This EUA will remain  in effect (meaning this test  can be used) for the duration of the COVID-19 declaration under Section 56 4(b)(1) of the Act, 21 U.S.C. section 360bbb-3(b)(1), unless the authorization is terminated or revoked sooner. Performed at [facility], [place] [address], [place], [place] [address]     Radiology Reports DG Chest [place] 1 View  Result Date: 09/10/2019 CLINICAL DATA:  Shortness of breath. EXAM: PORTABLE CHEST 1 VIEW COMPARISON:  August 25, 2019. FINDINGS: Stable cardiomediastinal silhouette. No pneumothorax is noted. Increased left basilar atelectasis or infiltrate is noted. Minimal right basilar subsegmental atelectasis or infiltrate is noted. Bony thorax is unremarkable. IMPRESSION: Increased bibasilar subsegmental atelectasis or infiltrates, left greater than right. Electronically Signed   By: Marijo Conception M.D.   On: 09/10/2019 10:14   DG Chest Portable 1 View  Result Date: 08/25/2019 CLINICAL DATA:  Shortness of breath. Abdominal  swelling and leg swelling. EXAM: PORTABLE CHEST 1 VIEW COMPARISON:  08/17/2019 FINDINGS: The heart size is within normal limits. Slight pulmonary vascular prominence. Minimal atelectasis at the left lung base. No pulmonary edema or visible pleural effusions. No bone abnormality. IMPRESSION: Slight pulmonary vascular prominence. Electronically Signed   By: Lorriane Shire M.D.   On: 08/25/2019 13:12   ECHOCARDIOGRAM COMPLETE  Result Date: 09/10/2019    ECHOCARDIOGRAM REPORT   Patient Name:   RAYMONE PEMBROKE Date of Exam: 09/10/2019 Medical Rec #:  [id number]       Height:       69.0 in Accession #:    [id number]      Weight:       315.0 lb Date of Birth:  01/09/1976        BSA:          2.506 m Patient Age:    25 years        BP:           134/94 mmHg Patient Gender: M               HR:           92 bpm. Exam Location:  [facility] Procedure: 2D Echo Indications:    Dyspnea 786.09 / R06.00  History:        Patient has no prior history of Echocardiogram examinations.                 Risk Factors:Non-Smoker and Hypertension. Edema, Portal vein                 thrombosis, History of COVID 19.  Sonographer:    Leavy Cella RDCS (AE) Referring Phys: [phone] DAVID TAT IMPRESSIONS  1. Left ventricular ejection fraction, by estimation, is 60 to 65%. The left ventricle has normal function. The left ventricle has no regional wall motion abnormalities. Left ventricular diastolic parameters are indeterminate.  2. Right ventricular systolic function is normal. The right ventricular size is normal.  3. The mitral valve was not well visualized. No evidence of mitral valve regurgitation. No evidence of mitral stenosis.  4. The aortic valve was not well visualized. Aortic valve regurgitation is not visualized. No aortic stenosis is present. FINDINGS  Left Ventricle: Left ventricular ejection fraction, by estimation, is 60 to 65%. The left ventricle has normal function. The left ventricle has no regional wall motion abnormalities. The  left ventricular internal cavity size was normal in size. There is  no left ventricular hypertrophy. Left ventricular diastolic parameters are indeterminate. Right Ventricle: The right ventricular size is normal. No increase in right ventricular wall thickness.  Right ventricular systolic function is normal. Left Atrium: Left atrial size was normal in size. Right Atrium: Right atrial size was normal in size. Pericardium: There is no evidence of pericardial effusion. Mitral Valve: The mitral valve was not well visualized. No evidence of mitral valve regurgitation. No evidence of mitral valve stenosis. Tricuspid Valve: The tricuspid valve is normal in structure. Tricuspid valve regurgitation is not demonstrated. No evidence of tricuspid stenosis. Aortic Valve: The aortic valve was not well visualized. Aortic valve regurgitation is not visualized. No aortic stenosis is present. Aortic valve mean gradient measures 5.2 mmHg. Aortic valve peak gradient measures 9.3 mmHg. Aortic valve area, by VTI measures 2.81 cm. Pulmonic Valve: The pulmonic valve was not well visualized. Pulmonic valve regurgitation is not visualized. No evidence of pulmonic stenosis. Aorta: The aortic root was not well visualized. Pulmonary Artery: Indeterminate PASP, inadequate TR jet. Venous: The inferior vena cava was not well visualized. IAS/Shunts: The interatrial septum was not well visualized. Additional Comments: Mild ascites is present.  LEFT VENTRICLE PLAX 2D LVIDd:         4.96 cm  Diastology LVIDs:         3.02 cm  LV e' lateral:   14.80 cm/s LV PW:         0.90 cm  LV E/e' lateral: 3.8 LV IVS:        0.86 cm  LV e' medial:    8.16 cm/s LVOT diam:     2.30 cm  LV E/e' medial:  6.9 LV SV:         86 LV SV Index:   34 LVOT Area:     4.15 cm  RIGHT VENTRICLE RV S prime:     12.60 cm/s TAPSE (M-mode): 2.8 cm LEFT ATRIUM             Index LA diam:        4.30 cm 1.72 cm/m LA Vol (A2C):   49.2 ml 19.63 ml/m LA Vol (A4C):   38.5 ml 15.36 ml/m  LA Biplane Vol: 47.3 ml 18.87 ml/m  AORTIC VALVE AV Area (Vmax):    2.62 cm AV Area (Vmean):   2.50 cm AV Area (VTI):     2.81 cm AV Vmax:           152.23 cm/s AV Vmean:          107.685 cm/s AV VTI:            0.307 m AV Peak Grad:      9.3 mmHg AV Mean Grad:      5.2 mmHg LVOT Vmax:         95.98 cm/s LVOT Vmean:        64.882 cm/s LVOT VTI:          0.208 m LVOT/AV VTI ratio: 0.68  AORTA Ao Root diam: 3.40 cm MITRAL VALVE MV Area (PHT): 3.81 cm    SHUNTS MV Decel Time: 199 msec    Systemic VTI:  0.21 m MV E velocity: 56.10 cm/s  Systemic Diam: 2.30 cm MV A velocity: 54.80 cm/s MV E/A ratio:  1.02 Carlyle Dolly MD Electronically signed by Carlyle Dolly MD Signature Date/Time: 09/10/2019/4:56:54 PM    Final    US LIVER DOPPLER  Result Date: 09/11/2019 CLINICAL DATA:  Cirrhosis, ascites, nonocclusive portal thrombus by CT EXAM: DUPLEX ULTRASOUND OF LIVER TECHNIQUE: Color and duplex Doppler ultrasound was performed to evaluate the hepatic in-flow and out-flow vessels. COMPARISON:  08/17/2019 CT FINDINGS: Liver:  Increased echogenicity with surface nodularity compatible with hepatic cirrhosis. No focal lesion, mass or intrahepatic biliary ductal dilatation. Main Portal Vein size: 2.7 cm Portal Vein Velocities Main Prox:  35 cm/sec Main Mid: 39 cm/sec Main Dist:  33 cm/sec Right: 26 cm/sec Left: 23 cm/sec Hepatic Vein Velocities Right:  40 cm/sec Middle:  27 cm/sec Left:  46 cm/sec IVC: Present and patent with normal respiratory phasicity. Hepatic Artery Velocity:  37 cm/sec Splenic Vein Velocity:  18 cm/sec Spleen: 18 cm x 5.5 cm x 22 cm with a total volume of 1163 cm^3 (411 cm^3 is upper limit normal) Portal Vein Occlusion/Thrombus: Nonocclusive hypoechoic thrombus along the main portal vein wall posteriorly extending into the right portal vein. This correlates with the CT. Splenic Vein Occlusion/Thrombus: No Ascites: Moderate ascites present. Varices: None IMPRESSION: Nonocclusive portal vein thrombus  within the main portal vein extending into the right portal vein. Hepatopetal flow is maintained. Hepatic cirrhosis and ascites.  Associated splenomegaly. Electronically Signed   By: Jerilynn Mages.  Shick M.D.   On: 09/11/2019 16:02     CBC Recent Labs  Lab 09/10/19 1025 09/11/19 0535 09/13/19 0551  WBC 6.7 5.8 4.4  HGB 12.1* 12.1* 10.5*  HCT 37.5* 38.4* 34.1*  PLT 111* 106* 90*  MCV 92.1 93.4 94.7  MCH 29.7 29.4 29.2  MCHC 32.3 31.5 30.8  RDW 14.2 14.3 13.8  LYMPHSABS 0.6*  --   --   MONOABS 0.7  --   --   EOSABS 0.2  --   --   BASOSABS 0.0  --   --     Chemistries  Recent Labs  Lab 09/10/19 1025 09/11/19 0535 09/13/19 0551  NA 137 139 138  K 3.6 4.0 4.4  CL 102 104 101  CO2 27 30 31   GLUCOSE 103* 88 78  BUN 13 13 12   CREATININE 0.72 0.72 0.70  CALCIUM 8.3* 8.3* 8.5*  AST 23 23 18   ALT 17 16 12   ALKPHOS 80 70 53  BILITOT 0.7 1.3* 1.1   ------------------------------------------------------------------------------------------------------------------ No results for input(s): CHOL, HDL, LDLCALC, TRIG, CHOLHDL, LDLDIRECT in the last 72 hours.  No results found for: HGBA1C ------------------------------------------------------------------------------------------------------------------ No results for input(s): TSH, T4TOTAL, T3FREE, THYROIDAB in the last 72 hours.  Invalid input(s): FREET3 ------------------------------------------------------------------------------------------------------------------ No results for input(s): VITAMINB12, FOLATE, FERRITIN, TIBC, IRON, RETICCTPCT in the last 72 hours.  Coagulation profile Recent Labs  Lab 09/10/19 1025 09/10/19 1526  INR 1.2 1.2    No results for input(s): DDIMER in the last 72 hours.  Cardiac Enzymes No results for input(s): CKMB, TROPONINI, MYOGLOBIN in the last 168 hours.  Invalid input(s): CK ------------------------------------------------------------------------------------------------------------------      Component Value Date/Time   BNP 44.0 08/25/2019 1329     Ketsia Linebaugh M.D on 09/13/2019 at 7:27 PM  Go to [email or web address] - for contact info  [facility] - Office  [phone]

## 2019-09-13 NOTE — Progress Notes (Addendum)
ANTICOAGULATION CONSULT NOTE -   Pharmacy Consult for:  heparin dosing Indication: portal vein thrombosis  No Known Allergies  Patient Measurements: Height: 5' 9"  (175.3 cm) Weight: 131.7 kg (290 lb 5.5 oz) IBW/kg (Calculated) : 70.7 Heparin Dosing Weight:  HEPARIN DW (KG): 104.7  Vital Signs: Temp: 98.1 F (36.7 C) (04/10 0507) Temp Source: Oral (04/10 0507) BP: 106/79 (04/10 0804) Pulse Rate: 67 (04/10 0804)  Labs: Recent Labs    09/10/19 1025 09/10/19 1025 09/10/19 1526 09/11/19 0535 09/12/19 1447 09/12/19 2054 09/13/19 0551  HGB 12.1*   < >  --  12.1*  --   --  10.5*  HCT 37.5*  --   --  38.4*  --   --  34.1*  PLT 111*  --   --  106*  --   --  90*  APTT  --   --   --   --  36  --   --   LABPROT 15.1  --  15.2  --   --   --   --   INR 1.2  --  1.2  --   --   --   --   HEPARINUNFRC  --   --   --   --   --  0.13* 0.17*  CREATININE 0.72  --   --  0.72  --   --  0.70   < > = values in this interval not displayed.    Estimated Creatinine Clearance: 158.5 mL/min (by C-G formula based on SCr of 0.7 mg/dL).  Medical History: Past Medical History:  Diagnosis Date  . Hypertension   . Liver cirrhosis ([place])   . Neuropathy     Assessment: Pharmacy consulted to dose heparin for this 44 yo male with portal vein thrombosis.  Upper endoscopy showed no acute bleeding at this time. Patient has low baseline platelets of 106K, but patient has a history of hepatic cirrhosis.  He wasn't on anti-coagulation prior to admission.  Heparin level came back at 0.17- still subtherapeutic  Goal of Therapy:  Heparin level 0.3-0.7 units/ml Monitor platelets by anticoagulation protocol: Yes   Plan:  Heparin bolus 3000 units x1 Increase heparin to 2150 units/hr Heparin level in 6 hours and daily CBC daily  Margot Ables, PharmD Clinical Pharmacist 09/13/2019 8:07 AM

## 2019-09-14 LAB — BASIC METABOLIC PANEL WITH GFR
Anion gap: 8 (ref 5–15)
BUN: 11 mg/dL (ref 6–20)
CO2: 30 mmol/L (ref 22–32)
Calcium: 8.6 mg/dL — ABNORMAL LOW (ref 8.9–10.3)
Chloride: 101 mmol/L (ref 98–111)
Creatinine, Ser: 0.72 mg/dL (ref 0.61–1.24)
GFR calc Af Amer: >60 mL/min
GFR calc non Af Amer: >60 mL/min
Glucose, Bld: 85 mg/dL (ref 70–99)
Potassium: 3.7 mmol/L (ref 3.5–5.1)
Sodium: 139 mmol/L (ref 135–145)

## 2019-09-14 LAB — CBC
HCT: 36.3 % — ABNORMAL LOW (ref 39.0–52.0)
Hemoglobin: 11.3 g/dL — ABNORMAL LOW (ref 13.0–17.0)
MCH: 29 pg (ref 26.0–34.0)
MCHC: 31.1 g/dL (ref 30.0–36.0)
MCV: 93.3 fL (ref 80.0–100.0)
Platelets: 101 K/uL — ABNORMAL LOW (ref 150–400)
RBC: 3.89 MIL/uL — ABNORMAL LOW (ref 4.22–5.81)
RDW: 14.2 % (ref 11.5–15.5)
WBC: 4.3 K/uL (ref 4.0–10.5)
nRBC: 0 % (ref 0.0–0.2)

## 2019-09-14 LAB — HEPARIN LEVEL (UNFRACTIONATED)
Heparin Unfractionated: 0.49 [IU]/mL (ref 0.30–0.70)
Heparin Unfractionated: 0.51 [IU]/mL (ref 0.30–0.70)

## 2019-09-14 MED ORDER — ONDANSETRON HCL 4 MG/2ML IJ SOLN
4.0000 mg | Freq: Four times a day (QID) | INTRAMUSCULAR | Status: DC | PRN
  Administered 2019-09-14: 4 mg via INTRAVENOUS
  Filled 2019-09-14: qty 2

## 2019-09-14 NOTE — Progress Notes (Signed)
Subjective:  Patient has no complaints.  He states left-sided abdominal pain has decreased.  He experienced no dysphagia with low-sodium heart healthy diet.  He denies melena or rectal bleeding.  He also denies shortness of breath.  Current Medications:  Current Facility-Administered Medications:  .  furosemide (LASIX) injection 40 mg, 40 mg, Intravenous, BID, Fields, Sandi L, MD, 40 mg at 09/14/19 0810 .  gabapentin (NEURONTIN) capsule 300 mg, 300 mg, Oral, QHS, Tat, David, MD, 300 mg at 09/13/19 2201 .  heparin ADULT infusion 100 units/mL (25000 units/258m sodium chloride 0.45%), 2,350 Units/hr, Intravenous, Continuous, LErenest Blank RPH, Last Rate: 23.5 mL/hr at 09/14/19 1241, 2,350 Units/hr at 09/14/19 1241 .  lidocaine (PF) (XYLOCAINE) 1 % injection 5 mL, 5 mL, Intradermal, Once, MNoemi Chapel MD .  polyethylene glycol (MIRALAX / GLYCOLAX) packet 17 g, 17 g, Oral, Daily, LMahala Menghini PA-C, 17 g at 09/14/19 0810 .  propranolol (INDERAL) tablet 20 mg, 20 mg, Oral, TID, Emokpae, Courage, MD, 20 mg at 09/14/19 0810 .  spironolactone (ALDACTONE) tablet 100 mg, 100 mg, Oral, Q breakfast, Fields, Sandi L, MD, 100 mg at 09/14/19 0810  Objective: Blood pressure 115/74, pulse 62, temperature 98.3 F (36.8 C), temperature source Oral, resp. rate 18, height 5' 9"  (1.753 m), weight 122.7 kg, SpO2 96 %.   Weight on standing scale to 70.5 pounds.  Patient is alert and in no acute distress. He does not have asterixis. No neck masses or thyromegaly noted. Cardiac exam with regular rhythm normal S1 and S2.  No murmur or gallop noted. Auscultation of lungs reveal vesicular breath sounds bilaterally. Abdomen is distended but not tense.  Abdominal wall edema has increased since last examined.  Edema extends into right flank and posteriorly.  There is no leakage from site where patient had paracentesis 4 days ago. 3+ pitting edema involving both legs.   Labs/studies Results:   CBC Latest  Ref Rng & Units 09/14/2019 09/13/2019 09/11/2019  WBC 4.0 - 10.5 K/uL 4.3 4.4 5.8  Hemoglobin 13.0 - 17.0 g/dL 11.3(L) 10.5(L) 12.1(L)  Hematocrit 39.0 - 52.0 % 36.3(L) 34.1(L) 38.4(L)  Platelets 150 - 400 K/uL 101(L) 90(L) 106(L)    CMP Latest Ref Rng & Units 09/14/2019 09/13/2019 09/11/2019  Glucose 70 - 99 mg/dL 85 78 88  BUN 6 - 20 mg/dL 11 12 13   Creatinine 0.61 - 1.24 mg/dL 0.72 0.70 0.72  Sodium 135 - 145 mmol/L 139 138 139  Potassium 3.5 - 5.1 mmol/L 3.7 4.4 4.0  Chloride 98 - 111 mmol/L 101 101 104  CO2 22 - 32 mmol/L 30 31 30   Calcium 8.9 - 10.3 mg/dL 8.6(L) 8.5(L) 8.3(L)  Total Protein 6.5 - 8.1 g/dL - 5.9(L) 6.5  Total Bilirubin 0.3 - 1.2 mg/dL - 1.1 1.3(H)  Alkaline Phos 38 - 126 U/L - 53 70  AST 15 - 41 U/L - 18 23  ALT 0 - 44 U/L - 12 16    Hepatic Function Latest Ref Rng & Units 09/13/2019 09/11/2019 09/10/2019  Total Protein 6.5 - 8.1 g/dL 5.9(L) 6.5 7.4  Albumin 3.5 - 5.0 g/dL 2.7(L) 2.6(L) 2.6(L)  AST 15 - 41 U/L 18 23 23   ALT 0 - 44 U/L 12 16 17   Alk Phosphatase 38 - 126 U/L 53 70 80  Total Bilirubin 0.3 - 1.2 mg/dL 1.1 1.3(H) 0.7    Heparin level therapeutic at 0.49  Urinary sodium 23.  Assessment:  #1.  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 #1a.  Fluid overload.  He has massive ascites and lower extremity edema.  He had LVAP for days ago.  Urinary sodium is low but he is still diuresing.  Last albumin dose was yesterday.  He may need few more doses.  Will assess in a.m. #1b.  Portal vein thrombosis.  His presentation does not suggest acute portal vein thrombosis but given his presentation and his young age it would be reasonable to presume that this is an acute situation and anticoagulation would be appropriate.  Patient is on heparin and level is therapeutic.  #2.  Anemia appears to be due to chronic disease.  No evidence of GI bleed in the setting of esophageal dilation 2 days ago  and the fact that patient is on heparin.  Hemoglobin is coming up.  #4.  Esophageal dysphagia secondary Schatzki's ring.  Dysphagia has resolved with dilation.  Patient is tolerating 2 g sodium heart healthy diet.  Plan:  Patient advised to lie on his right side in order to help absorb fluid from left-sided abdominal wall. Metabolic 7 in a.m.

## 2019-09-14 NOTE — Progress Notes (Signed)
ANTICOAGULATION CONSULT NOTE  Pharmacy Consult for Heparin dosing Indication: portal vein thrombosis  No Known Allergies  Patient Measurements: Height: 5' 9"  (175.3 cm) Weight: 127.2 kg (280 lb 6.8 oz) IBW/kg (Calculated) : 70.7 Heparin Dosing Weight:  HEPARIN DW (KG): 104.7  Vital Signs: Temp: 98.3 F (36.8 C) (04/11 0453) Temp Source: Oral (04/11 0453) BP: 112/85 (04/11 0453) Pulse Rate: 83 (04/11 0453)  Labs: Recent Labs    09/12/19 1447 09/12/19 2054 09/13/19 0551 09/13/19 0551 09/13/19 1444 09/13/19 2219 09/14/19 0512  HGB  --   --  10.5*  --   --   --  11.3*  HCT  --   --  34.1*  --   --   --  36.3*  PLT  --   --  90*  --   --   --  101*  APTT 36  --   --   --   --   --   --   HEPARINUNFRC  --    < > 0.17*   < > 0.39 0.24* 0.49  CREATININE  --   --  0.70  --   --   --  0.72   < > = values in this interval not displayed.    Estimated Creatinine Clearance: 155.5 mL/min (by C-G formula based on SCr of 0.72 mg/dL).  Medical History: Past Medical History:  Diagnosis Date  . Hypertension   . Liver cirrhosis ([place])   . Neuropathy     Assessment: Pharmacy consulted to dose heparin for this 44 yo male with portal vein thrombosis.  Upper endoscopy showed no acute bleeding at this time. Patient has low baseline platelets of 106K, but patient has a history of hepatic cirrhosis.  He wasn't on anti-coagulation prior to admission.  Heparin level 0.49- therapeutic   Goal of Therapy: .  Heparin level 0.3-0.7 units/ml Monitor platelets by anticoagulation protocol: Yes   Plan:  Continue heparin at 2350 units/hr Heparin level in 6 hours and daily. Continue to monitor H&H and platelets.   Margot Ables, PharmD Clinical Pharmacist 09/14/2019 7:52 AM

## 2019-09-14 NOTE — Progress Notes (Signed)
ANTICOAGULATION CONSULT NOTE  Pharmacy Consult for Heparin dosing Indication: portal vein thrombosis  No Known Allergies  Patient Measurements: Height: 5' 9"  (175.3 cm) Weight: 122.7 kg (270 lb 8 oz) IBW/kg (Calculated) : 70.7 Heparin Dosing Weight:  HEPARIN DW (KG): 104.7  Vital Signs: Temp: 98.3 F (36.8 C) (04/11 0453) Temp Source: Oral (04/11 0453) BP: 115/74 (04/11 0809) Pulse Rate: 62 (04/11 0809)  Labs: Recent Labs    09/12/19 1447 09/12/19 2054 09/13/19 0551 09/13/19 1444 09/13/19 2219 09/14/19 0512 09/14/19 1233  HGB  --   --  10.5*  --   --  11.3*  --   HCT  --   --  34.1*  --   --  36.3*  --   PLT  --   --  90*  --   --  101*  --   APTT 36  --   --   --   --   --   --   HEPARINUNFRC  --    < > 0.17*   < > 0.24* 0.49 0.51  CREATININE  --   --  0.70  --   --  0.72  --    < > = values in this interval not displayed.    Estimated Creatinine Clearance: 152.5 mL/min (by C-G formula based on SCr of 0.72 mg/dL).  Medical History: Past Medical History:  Diagnosis Date  . Hypertension   . Liver cirrhosis ([place])   . Neuropathy     Assessment: Pharmacy consulted to dose heparin for this 44 yo male with portal vein thrombosis.  Upper endoscopy showed no acute bleeding at this time. Patient has low baseline platelets of 106K, but patient has a history of hepatic cirrhosis.  He wasn't on anti-coagulation prior to admission.  Heparin level 0.51- therapeutic   Goal of Therapy: .  Heparin level 0.3-0.7 units/ml Monitor platelets by anticoagulation protocol: Yes   Plan:  Continue heparin at 2350 units/hr Heparin level daily Continue to monitor H&H and platelets.   Margot Ables, PharmD Clinical Pharmacist 09/14/2019 1:29 PM

## 2019-09-14 NOTE — Progress Notes (Signed)
Patient Demographics:    Alejandro Porter, is a 44 y.o. male, DOB - Aug 20, 1975, WNU:[id number]  Admit date - 09/10/2019   Admitting Physician Orson Eva, MD  Outpatient Primary MD for the patient is [facility]  LOS - 4   Chief Complaint  Patient presents with  . Ascites        Subjective:    Alejandro Porter today has no fevers, no emesis,  No chest pain,   - -Resting comfortably, reports 1 dark BM---not to flush the stool   Assessment  & Plan :    Active Problems:   Cirrhosis of liver with ascites (HCC)   Portal vein thrombosis  Brief summary 44 y.o. male with medical history of hypertension and recently diagnosed liver cirrhosis presenting with worsening lower extremity edema and increasing abdominal girth admitted from GI clinic on 09/10/2019 after failing outpatient oral diuretics for anasarca and worsening ascites  A/p 1)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 Child Pugh B. -s/p paracentesis on 09/10/2019 with removal of 8 L -Patient failed outpatient diuresis with oral agents, continue IV Lasix with IV albumin and p.o. Aldactone -GI consult appreciated -he will need vaccination against hep B and hep A  2)Portal vein thrombosis, nonocclusive, extending into right intrahepatic portal vein, first noted at time of CT 08/16/20---  EGD showed grade 1 and grade 2 esophageal varices without stigmata of bleeding -Discussed with Dr. Delton Coombes from hematology -GI service also recommends anticoagulation ---c/n IV heparin, plan to transition to p.o. Eliquis if no bleeding and if platelet counts are stable -For now platelet counts appear to be drifting down -Platelets were 185 on 08/25/2019,  -Platelets down to 90 K (platelet counts were already drifting down prior to initiation of IV heparin on  09/12/2019 Platelets 185 (08/25/19),  Platelets this admission- 111>>106>>>90>>101 -Monitor platelets and H&H, monitor for bleeding -Risk versus benefit of full anticoagulation discussed with patient, his father and his cousin at bedside----they have decided to go ahead with full anticoagulation  3)H/o COVID 19 infection--diagnosed 08/25/2019, repeat Covid test negative on 09/10/2019 -Asymptomatic,   4)HTN--stopped metoprolol, use propanolol due to moderate portal hypertension  5)Esophageal dysphagia secondary Schatzki's ring.  Patient underwent esophageal dilation on 09/12/2019--- tolerating oral intake well  Disposition/Need for in-Hospital Stay- patient unable to be discharged at this time due to ---decompensated liver cirrhosis requiring aggressive iv diuresis and portal vein thrombosis requiring IV heparin -Patient From: Home D/C Place: Home Barriers: Not Clinically Stable- --Patient failed outpatient diuresis with oral agents, continue IV Lasix and IV heparin --Possible discharge home on 09/15/2019 with Eliquis if platelets and H&H is stable on p.o. diuretics  Code Status : Full code  Family Communication:   (patient is alert, awake and coherent) Cousin and father at bedside  Consults  :  GI/phone consult with Dr. Delton Coombes from hematology  Procedures:- 1) paracentesis on 09/10/2019 with removal of 8 L 2)EGD on 09/12/2019- Small hiatal hernia. Mild erosive reflux esophagitis. Schatzki's ring - status post dilation   - Small grade 1/grade 2 esophageal varices without  bleeding stigmata - Portal hypertensive gastropathy  DVT Prophylaxis  : -IV heparin  Lab Results  Component Value Date   PLT  101 (L) 09/14/2019   Inpatient Medications  Scheduled Meds: . furosemide  40 mg Intravenous BID  . gabapentin  300 mg Oral QHS  . lidocaine (PF)  5 mL Intradermal Once  . polyethylene glycol  17 g Oral Daily  . propranolol  20 mg Oral TID  . spironolactone  100 mg Oral Q breakfast    Continuous Infusions: . heparin 2,350 Units/hr (09/14/19 0147)   PRN Meds:.  Anti-infectives (From admission, onward)   None       Objective:   Vitals:   09/13/19 2121 09/14/19 0453 09/14/19 0455 09/14/19 0809  BP: 122/79 112/85  115/74  Pulse: 76 83  62  Resp: 18 18    Temp: 98.3 F (36.8 C) 98.3 F (36.8 C)    TempSrc: Oral Oral    SpO2: 96% 96%    Weight:   127.2 kg   Height:        Wt Readings from Last 3 Encounters:  09/14/19 127.2 kg  09/10/19 (!) 142.9 kg  08/25/19 133.8 kg     Intake/Output Summary (Last 24 hours) at 09/14/2019 1019 Last data filed at 09/14/2019 0600 Gross per 24 hour  Intake 1575.36 ml  Output --  Net 1575.36 ml    Physical Exam  Gen:- Awake Alert, no acute distress HEENT:- [place].AT,   Neck-Supple Neck,No JVD,.  Lungs-  CTAB , fair symmetrical air movement CV- S1, S2 normal, regular  Abd-diminished bowel sounds, no significant tenderness, ascites with fluid thrill noted,    Extremity/Skin:-2+ pitting edema, pedal pulses present  Psych-affect is appropriate, oriented x3 Neuro-generalized weakness, no new focal deficits, no tremors   Data Review:   Micro Results Recent Results (from the past 240 hour(s))  Culture, body fluid-bottle     Status: None (Preliminary result)   Collection Time: 09/10/19 10:27 AM   Specimen: Ascitic  Result Value Ref Range Status   Specimen Description ASCITIC  Final   Special Requests BOTTLES DRAWN AEROBIC AND ANAEROBIC 10CC  Final   Culture   Final    NO GROWTH 3 DAYS Performed at [facility], [address], [place], [place] [address]    Report Status PENDING  Incomplete  Gram stain     Status: None   Collection Time: 09/10/19 10:27 AM   Specimen: Ascitic  Result Value Ref Range Status   Specimen Description ASCITIC  Final   Special Requests NONE  Final   Gram Stain   Final    NO ORGANISMS SEEN WBC PRESENT, PREDOMINANTLY MONONUCLEAR CYTOSPIN SMEAR Performed at [facility], [address], [place],  [address]    Report Status 09/10/2019 FINAL  Final  SARS CORONAVIRUS 2 (TAT 6-24 HRS) Nasopharyngeal Nasopharyngeal Swab     Status: None   Collection Time: 09/10/19 12:09 PM   Specimen: Nasopharyngeal Swab  Result Value Ref Range Status   SARS Coronavirus 2 NEGATIVE NEGATIVE Final    Comment: (NOTE) SARS-CoV-2 target nucleic acids are NOT DETECTED. The SARS-CoV-2 RNA is generally detectable in upper and lower respiratory specimens during the acute phase of infection. Negative results do not preclude SARS-CoV-2 infection, do not rule out co-infections with other pathogens, and should not be used as the sole basis for treatment or other patient management decisions. Negative results must be combined with clinical observations, patient history, and epidemiological information. The expected result is Negative. Fact Sheet for Patients: SugarRoll.be Fact Sheet for Healthcare Providers: https://www.woods-mathews.com/ This test is not yet approved or cleared by the [place]  FDA and  has been authorized for detection and/or diagnosis of SARS-CoV-2 by FDA under an Emergency Use Authorization (EUA). This EUA will remain  in effect (meaning this test can be used) for the duration of the COVID-19 declaration under Section 56 4(b)(1) of the Act, 21 U.S.C. section 360bbb-3(b)(1), unless the authorization is terminated or revoked sooner. Performed at [facility], [place] [address], [place], [place] [address]     Radiology Reports DG Chest [place] 1 View  Result Date: 09/10/2019 CLINICAL DATA:  Shortness of breath. EXAM: PORTABLE CHEST 1 VIEW COMPARISON:  August 25, 2019. FINDINGS: Stable cardiomediastinal silhouette. No pneumothorax is noted. Increased left basilar atelectasis or infiltrate is noted. Minimal right basilar subsegmental atelectasis or infiltrate is noted. Bony thorax is unremarkable. IMPRESSION: Increased bibasilar  subsegmental atelectasis or infiltrates, left greater than right. Electronically Signed   By: Marijo Conception M.D.   On: 09/10/2019 10:14   DG Chest Portable 1 View  Result Date: 08/25/2019 CLINICAL DATA:  Shortness of breath. Abdominal swelling and leg swelling. EXAM: PORTABLE CHEST 1 VIEW COMPARISON:  08/17/2019 FINDINGS: The heart size is within normal limits. Slight pulmonary vascular prominence. Minimal atelectasis at the left lung base. No pulmonary edema or visible pleural effusions. No bone abnormality. IMPRESSION: Slight pulmonary vascular prominence. Electronically Signed   By: Lorriane Shire M.D.   On: 08/25/2019 13:12   ECHOCARDIOGRAM COMPLETE  Result Date: 09/10/2019    ECHOCARDIOGRAM REPORT   Patient Name:   ANDRIY SHERK Date of Exam: 09/10/2019 Medical Rec #:  [id number]       Height:       69.0 in Accession #:    [id number]      Weight:       315.0 lb Date of Birth:  January 03, 1976        BSA:          2.506 m Patient Age:    36 years        BP:           134/94 mmHg Patient Gender: M               HR:           92 bpm. Exam Location:  [facility] Procedure: 2D Echo Indications:    Dyspnea 786.09 / R06.00  History:        Patient has no prior history of Echocardiogram examinations.                 Risk Factors:Non-Smoker and Hypertension. Edema, Portal vein                 thrombosis, History of COVID 19.  Sonographer:    Leavy Cella RDCS (AE) Referring Phys: [phone] DAVID TAT IMPRESSIONS  1. Left ventricular ejection fraction, by estimation, is 60 to 65%. The left ventricle has normal function. The left ventricle has no regional wall motion abnormalities. Left ventricular diastolic parameters are indeterminate.  2. Right ventricular systolic function is normal. The right ventricular size is normal.  3. The mitral valve was not well visualized. No evidence of mitral valve regurgitation. No evidence of mitral stenosis.  4. The aortic valve was not well visualized. Aortic valve regurgitation is not  visualized. No aortic stenosis is present. FINDINGS  Left Ventricle: Left ventricular ejection fraction, by estimation, is 60 to 65%. The left ventricle has normal function. The left ventricle has no regional wall motion abnormalities. The left ventricular internal cavity size was normal  in size. There is  no left ventricular hypertrophy. Left ventricular diastolic parameters are indeterminate. Right Ventricle: The right ventricular size is normal. No increase in right ventricular wall thickness. Right ventricular systolic function is normal. Left Atrium: Left atrial size was normal in size. Right Atrium: Right atrial size was normal in size. Pericardium: There is no evidence of pericardial effusion. Mitral Valve: The mitral valve was not well visualized. No evidence of mitral valve regurgitation. No evidence of mitral valve stenosis. Tricuspid Valve: The tricuspid valve is normal in structure. Tricuspid valve regurgitation is not demonstrated. No evidence of tricuspid stenosis. Aortic Valve: The aortic valve was not well visualized. Aortic valve regurgitation is not visualized. No aortic stenosis is present. Aortic valve mean gradient measures 5.2 mmHg. Aortic valve peak gradient measures 9.3 mmHg. Aortic valve area, by VTI measures 2.81 cm. Pulmonic Valve: The pulmonic valve was not well visualized. Pulmonic valve regurgitation is not visualized. No evidence of pulmonic stenosis. Aorta: The aortic root was not well visualized. Pulmonary Artery: Indeterminate PASP, inadequate TR jet. Venous: The inferior vena cava was not well visualized. IAS/Shunts: The interatrial septum was not well visualized. Additional Comments: Mild ascites is present.  LEFT VENTRICLE PLAX 2D LVIDd:         4.96 cm  Diastology LVIDs:         3.02 cm  LV e' lateral:   14.80 cm/s LV PW:         0.90 cm  LV E/e' lateral: 3.8 LV IVS:        0.86 cm  LV e' medial:    8.16 cm/s LVOT diam:     2.30 cm  LV E/e' medial:  6.9 LV SV:         86 LV SV  Index:   34 LVOT Area:     4.15 cm  RIGHT VENTRICLE RV S prime:     12.60 cm/s TAPSE (M-mode): 2.8 cm LEFT ATRIUM             Index LA diam:        4.30 cm 1.72 cm/m LA Vol (A2C):   49.2 ml 19.63 ml/m LA Vol (A4C):   38.5 ml 15.36 ml/m LA Biplane Vol: 47.3 ml 18.87 ml/m  AORTIC VALVE AV Area (Vmax):    2.62 cm AV Area (Vmean):   2.50 cm AV Area (VTI):     2.81 cm AV Vmax:           152.23 cm/s AV Vmean:          107.685 cm/s AV VTI:            0.307 m AV Peak Grad:      9.3 mmHg AV Mean Grad:      5.2 mmHg LVOT Vmax:         95.98 cm/s LVOT Vmean:        64.882 cm/s LVOT VTI:          0.208 m LVOT/AV VTI ratio: 0.68  AORTA Ao Root diam: 3.40 cm MITRAL VALVE MV Area (PHT): 3.81 cm    SHUNTS MV Decel Time: 199 msec    Systemic VTI:  0.21 m MV E velocity: 56.10 cm/s  Systemic Diam: 2.30 cm MV A velocity: 54.80 cm/s MV E/A ratio:  1.02 Carlyle Dolly MD Electronically signed by Carlyle Dolly MD Signature Date/Time: 09/10/2019/4:56:54 PM    Final    US LIVER DOPPLER  Result Date: 09/11/2019 CLINICAL DATA:  Cirrhosis, ascites, nonocclusive portal  thrombus by CT EXAM: DUPLEX ULTRASOUND OF LIVER TECHNIQUE: Color and duplex Doppler ultrasound was performed to evaluate the hepatic in-flow and out-flow vessels. COMPARISON:  08/17/2019 CT FINDINGS: Liver: Increased echogenicity with surface nodularity compatible with hepatic cirrhosis. No focal lesion, mass or intrahepatic biliary ductal dilatation. Main Portal Vein size: 2.7 cm Portal Vein Velocities Main Prox:  35 cm/sec Main Mid: 39 cm/sec Main Dist:  33 cm/sec Right: 26 cm/sec Left: 23 cm/sec Hepatic Vein Velocities Right:  40 cm/sec Middle:  27 cm/sec Left:  46 cm/sec IVC: Present and patent with normal respiratory phasicity. Hepatic Artery Velocity:  37 cm/sec Splenic Vein Velocity:  18 cm/sec Spleen: 18 cm x 5.5 cm x 22 cm with a total volume of 1163 cm^3 (411 cm^3 is upper limit normal) Portal Vein Occlusion/Thrombus: Nonocclusive hypoechoic thrombus along  the main portal vein wall posteriorly extending into the right portal vein. This correlates with the CT. Splenic Vein Occlusion/Thrombus: No Ascites: Moderate ascites present. Varices: None IMPRESSION: Nonocclusive portal vein thrombus within the main portal vein extending into the right portal vein. Hepatopetal flow is maintained. Hepatic cirrhosis and ascites.  Associated splenomegaly. Electronically Signed   By: Jerilynn Mages.  Shick M.D.   On: 09/11/2019 16:02     CBC Recent Labs  Lab 09/10/19 1025 09/11/19 0535 09/13/19 0551 09/14/19 0512  WBC 6.7 5.8 4.4 4.3  HGB 12.1* 12.1* 10.5* 11.3*  HCT 37.5* 38.4* 34.1* 36.3*  PLT 111* 106* 90* 101*  MCV 92.1 93.4 94.7 93.3  MCH 29.7 29.4 29.2 29.0  MCHC 32.3 31.5 30.8 31.1  RDW 14.2 14.3 13.8 14.2  LYMPHSABS 0.6*  --   --   --   MONOABS 0.7  --   --   --   EOSABS 0.2  --   --   --   BASOSABS 0.0  --   --   --     Chemistries  Recent Labs  Lab 09/10/19 1025 09/11/19 0535 09/13/19 0551 09/14/19 0512  NA 137 139 138 139  K 3.6 4.0 4.4 3.7  CL 102 104 101 101  CO2 27 30 31 30   GLUCOSE 103* 88 78 85  BUN 13 13 12 11   CREATININE 0.72 0.72 0.70 0.72  CALCIUM 8.3* 8.3* 8.5* 8.6*  AST 23 23 18   --   ALT 17 16 12   --   ALKPHOS 80 70 53  --   BILITOT 0.7 1.3* 1.1  --    ------------------------------------------------------------------------------------------------------------------ No results for input(s): CHOL, HDL, LDLCALC, TRIG, CHOLHDL, LDLDIRECT in the last 72 hours.  No results found for: HGBA1C ------------------------------------------------------------------------------------------------------------------ No results for input(s): TSH, T4TOTAL, T3FREE, THYROIDAB in the last 72 hours.  Invalid input(s): FREET3 ------------------------------------------------------------------------------------------------------------------ No results for input(s): VITAMINB12, FOLATE, FERRITIN, TIBC, IRON, RETICCTPCT in the last 72  hours.  Coagulation profile Recent Labs  Lab 09/10/19 1025 09/10/19 1526  INR 1.2 1.2    No results for input(s): DDIMER in the last 72 hours.  Cardiac Enzymes No results for input(s): CKMB, TROPONINI, MYOGLOBIN in the last 168 hours.  Invalid input(s): CK ------------------------------------------------------------------------------------------------------------------    Component Value Date/Time   BNP 44.0 08/25/2019 1329     Shrihan Putt M.D on 09/14/2019 at 10:19 AM  Go to [email or web address] - for contact info  [facility] - Office  [phone]

## 2019-09-15 LAB — COMPREHENSIVE METABOLIC PANEL
ALT: 12 U/L (ref 0–44)
AST: 21 U/L (ref 15–41)
Albumin: 2.7 g/dL — ABNORMAL LOW (ref 3.5–5.0)
Alkaline Phosphatase: 54 U/L (ref 38–126)
Anion gap: 7 (ref 5–15)
BUN: 15 mg/dL (ref 6–20)
CO2: 30 mmol/L (ref 22–32)
Calcium: 8.3 mg/dL — ABNORMAL LOW (ref 8.9–10.3)
Chloride: 101 mmol/L (ref 98–111)
Creatinine, Ser: 0.76 mg/dL (ref 0.61–1.24)
GFR calc Af Amer: >60 mL/min (ref >60)
GFR calc non Af Amer: >60 mL/min (ref >60)
Glucose, Bld: 88 mg/dL (ref 70–99)
Potassium: 3.8 mmol/L (ref 3.5–5.1)
Sodium: 138 mmol/L (ref 135–145)
Total Bilirubin: 0.6 mg/dL (ref 0.3–1.2)
Total Protein: 6 g/dL — ABNORMAL LOW (ref 6.5–8.1)

## 2019-09-15 LAB — CBC
HCT: 34 % — ABNORMAL LOW (ref 39.0–52.0)
Hemoglobin: 10.7 g/dL — ABNORMAL LOW (ref 13.0–17.0)
MCH: 29.5 pg (ref 26.0–34.0)
MCHC: 31.5 g/dL (ref 30.0–36.0)
MCV: 93.7 fL (ref 80.0–100.0)
Platelets: 107 10*3/uL — ABNORMAL LOW (ref 150–400)
RBC: 3.63 MIL/uL — ABNORMAL LOW (ref 4.22–5.81)
RDW: 14.1 % (ref 11.5–15.5)
WBC: 4.6 10*3/uL (ref 4.0–10.5)
nRBC: 0 % (ref 0.0–0.2)

## 2019-09-15 LAB — CULTURE, BODY FLUID W GRAM STAIN -BOTTLE: Culture: NO GROWTH

## 2019-09-15 LAB — HEPARIN LEVEL (UNFRACTIONATED): Heparin Unfractionated: 0.36 IU/mL (ref 0.30–0.70)

## 2019-09-15 LAB — OCCULT BLOOD X 1 CARD TO LAB, STOOL: Fecal Occult Bld: POSITIVE — AB

## 2019-09-15 MED ORDER — APIXABAN 5 MG PO TABS
10.0000 mg | ORAL_TABLET | Freq: Two times a day (BID) | ORAL | Status: DC
  Administered 2019-09-15: 10 mg via ORAL
  Filled 2019-09-15: qty 2

## 2019-09-15 MED ORDER — OXYCODONE HCL 5 MG PO TABS
5.0000 mg | ORAL_TABLET | Freq: Four times a day (QID) | ORAL | Status: DC | PRN
  Administered 2019-09-15 – 2019-09-21 (×3): 5 mg via ORAL
  Filled 2019-09-15 (×3): qty 1

## 2019-09-15 MED ORDER — HEPARIN (PORCINE) 25000 UT/250ML-% IV SOLN
2000.0000 [IU]/h | INTRAVENOUS | Status: AC
  Administered 2019-09-15 – 2019-09-16 (×2): 2350 [IU]/h via INTRAVENOUS
  Administered 2019-09-17: 2000 [IU]/h via INTRAVENOUS
  Administered 2019-09-17: 2100 [IU]/h via INTRAVENOUS
  Administered 2019-09-18: 2000 [IU]/h via INTRAVENOUS
  Filled 2019-09-15 (×5): qty 250

## 2019-09-15 MED ORDER — APIXABAN 5 MG PO TABS: 5.0000 mg | ORAL_TABLET | Freq: Two times a day (BID) | ORAL | Status: DC

## 2019-09-15 MED ORDER — ALBUMIN HUMAN 25 % IV SOLN
25.0000 g | Freq: Two times a day (BID) | INTRAVENOUS | Status: AC
  Administered 2019-09-15 – 2019-09-17 (×4): 25 g via INTRAVENOUS
  Filled 2019-09-15 (×3): qty 100

## 2019-09-15 NOTE — Progress Notes (Signed)
ANTICOAGULATION CONSULT NOTE  Pharmacy Consult for Heparin dosing Indication: portal vein thrombosis  No Known Allergies  Patient Measurements: Height: 5' 9"  (175.3 cm) Weight: 124.3 kg (274 lb 0.5 oz) IBW/kg (Calculated) : 70.7 Heparin Dosing Weight:  HEPARIN DW (KG): 104.7  Vital Signs: Temp: 98.1 F (36.7 C) (04/12 0548) BP: 106/70 (04/12 0548) Pulse Rate: 88 (04/12 0548)  Labs: Recent Labs    09/12/19 1447 09/12/19 2054 09/13/19 0551 09/13/19 1444 09/14/19 0512 09/14/19 1233 09/15/19 0448 09/15/19 0449  HGB  --    < > 10.5*  --  11.3*  --  10.7*  --   HCT  --   --  34.1*  --  36.3*  --  34.0*  --   PLT  --   --  90*  --  101*  --  107*  --   APTT 36  --   --   --   --   --   --   --   HEPARINUNFRC  --    < > 0.17*   < > 0.49 0.51  --  0.36  CREATININE  --   --  0.70  --  0.72  --  0.76  --    < > = values in this interval not displayed.    Estimated Creatinine Clearance: 153.5 mL/min (by C-G formula based on SCr of 0.76 mg/dL).  Medical History: Past Medical History:  Diagnosis Date  . Hypertension   . Liver cirrhosis ([place])   . Neuropathy     Assessment: Pharmacy consulted to dose heparin for this 44 yo male with portal vein thrombosis.  Upper endoscopy showed no acute bleeding at this time. Patient has low baseline platelets of 106K, but patient has a history of hepatic cirrhosis.  He wasn't on anti-coagulation prior to admission.  Heparin level 0.36- therapeutic   Goal of Therapy: .  Heparin level 0.3-0.7 units/ml Monitor platelets by anticoagulation protocol: Yes   Plan:  Continue heparin at 2350 units/hr Heparin level daily Continue to monitor H&H and platelets.   Margot Ables, PharmD Clinical Pharmacist 09/15/2019 7:38 AM

## 2019-09-15 NOTE — Progress Notes (Addendum)
[place] for apixaban dosing>>>heparin Indication: portal vein thrombosis  No Known Allergies  Patient Measurements: Height: 5' 9"  (175.3 cm) Weight: 124.3 kg (274 lb 0.5 oz) IBW/kg (Calculated) : 70.7 Heparin Dosing Weight:  HEPARIN DW (KG): 104.7  Vital Signs: Temp: 98.1 F (36.7 C) (04/12 0548) BP: 120/84 (04/12 0849) Pulse Rate: 88 (04/12 0548)  Labs: Recent Labs    09/12/19 1447 09/12/19 2054 09/13/19 0551 09/13/19 1444 09/14/19 0512 09/14/19 1233 09/15/19 0448 09/15/19 0449  HGB  --    < > 10.5*  --  11.3*  --  10.7*  --   HCT  --   --  34.1*  --  36.3*  --  34.0*  --   PLT  --   --  90*  --  101*  --  107*  --   APTT 36  --   --   --   --   --   --   --   HEPARINUNFRC  --    < > 0.17*   < > 0.49 0.51  --  0.36  CREATININE  --   --  0.70  --  0.72  --  0.76  --    < > = values in this interval not displayed.    Estimated Creatinine Clearance: 153.5 mL/min (by C-G formula based on SCr of 0.76 mg/dL).  Medical History: Past Medical History:  Diagnosis Date  . Hypertension   . Liver cirrhosis ([place])   . Neuropathy     Assessment: Pharmacy consulted to dose heparin for this 44 yo male with portal vein thrombosis.  Upper endoscopy showed no acute bleeding at this time. Patient has low baseline platelets of 106K, but patient has a history of hepatic cirrhosis.  He wasn't on anti-coagulation prior to admission.  Plan was initially to change to apixaban but deemed not able to tolerate- got 1 dose at 1130.  Transition back to heparin IV. May need to monitor based on aPTT since one dose of Eliquis given.  Goal of Therapy: .  HL 0.3-0.7 APTT 66-102 sec Monitor platelets by anticoagulation protocol: Yes   Plan:  Stop Eliquis Start heparin infusion at 2330 (next due time for apixaban) Resume heparin infusion at previous rate of 2350 units/hr. Heparin level/aPTT in 6 hours and daily. Continue to monitor H&H and platelets.    Margot Ables, PharmD Clinical Pharmacist 09/15/2019 10:35 AM

## 2019-09-15 NOTE — Progress Notes (Addendum)
Subjective: States stool has been dark like tar even prior to admission intermittently. Over weekend stool was dark. Hemoccult positive. No bright red blood. Mild vague abdominal pain last night but resolved. No N/V.   Objective: Vital signs in last 24 hours: Temp:  [98.1 F (36.7 C)-98.4 F (36.9 C)] 98.1 F (36.7 C) (04/12 0548) Pulse Rate:  [67-88] 88 (04/12 0548) Resp:  [16-17] 17 (04/12 0548) BP: (105-120)/(67-84) 120/84 (04/12 0849) SpO2:  [97 %] 97 % (04/12 0548) Weight:  [122.7 kg-124.3 kg] 124.3 kg (04/12 0548) Last BM Date: 09/15/19(Dark) General:   Alert and oriented, chronically ill-appearing Head:  Normocephalic and atraumatic. Eyes:  No icterus, sclera clear. Conjuctiva pink.  Abdomen:  Bowel sounds present, distended with large AP diameter but non-tense. Abdominal wall edema, with left-sided abdomen more protuberant. 2+ edema bilateral flanks Extremities:  With 3+ pitting edema lower extremities  Neurologic:  Alert and  oriented x4 Psych:  Alert and cooperative. Normal mood and affect.  Intake/Output from previous day: 04/11 0701 - 04/12 0700 In: 210 [I.V.:210] Out: -  Intake/Output this shift: No intake/output data recorded.  Lab Results: Recent Labs    09/13/19 0551 09/14/19 0512 09/15/19 0448  WBC 4.4 4.3 4.6  HGB 10.5* 11.3* 10.7*  HCT 34.1* 36.3* 34.0*  PLT 90* 101* 107*   BMET Recent Labs    09/13/19 0551 09/14/19 0512 09/15/19 0448  NA 138 139 138  K 4.4 3.7 3.8  CL 101 101 101  CO2 31 30 30   GLUCOSE 78 85 88  BUN 12 11 15   CREATININE 0.70 0.72 0.76  CALCIUM 8.5* 8.6* 8.3*   LFT Recent Labs    09/13/19 0551 09/15/19 0448  PROT 5.9* 6.0*  ALBUMIN 2.7* 2.7*  AST 18 21  ALT 12 12  ALKPHOS 53 54  BILITOT 1.1 0.6    Weights since admission 09/15/19 05:48:01  124.3 kg  274.03 lbs  -  40.45 EJ   09/14/19 1100  122.7 kg  270.5 lbs  -  39.93 RT   09/14/19 0455  127.2 kg  280.43 lbs  -  41.39 EJ   09/13/19 0349  131.7 kg   290.35 lbs  -  42.86 DR   09/12/19 0522  132.4 kg  291.89 lbs  -  43.08 DR   09/11/19 0942  130.3 kg  287.26 lbs  -  42.4 PB   09/10/19 0949  142.9 kgAbnormal   315 lbs  2.64 sq meters  46.5 EF      Assessment: 44 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   Anasarca/ascites: LVAP in ED on 4/7 with 8 liters removed. Negative cell count for SBP. Completed 6 doses of Albumin IV followed by Lasix and now remains on Lasix 40 mg IV BID and Aldactone 100 mg daily. Responded to albumin initially. Still with notable anasarca and lower extremity edema although improving. Will repeat additional rounds over next 24-48 hours and reassess in morning.   Anemia: likely chronic disease. Reports of black stool prior to admission and states he noticed this over the weekend and this morning. Heme positive today. Hgb fluctuating without significant drop and similar to 2 days ago. Will continue to monitor H/H in setting of anticoagulation. Could easily be oozing from known portal gastropathy. 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 Will have to discuss further management with attending. I discussed briefly with Dr Denton Brick.  Portal vein thrombosis: unclear if acute or chronic and now started on anticoagulation. Monitor for acute drops in Hgb in setting of anticoagulation as noted.   Dysphagia: undergoing dilation of Schatzki's ring this admission with improvement.    Plan: Hep A.B vaccination as outpatient  Anticoagulation per pharmacy; appreciate assistance  Monitor for drops in Hgb in setting of anticoagulation, any overt GI bleeding, repeat CBC in am, further discussion with Dr. Gala Romney. Received dose of Eliquis 10 mg this morning but now on hold.   Daily weights  Repeat  rounds of IV albumin with lasix IV followed 1 hour after albumin  2 gram sodium diet  Will reassess in the morning   Annitta Needs, PhD, ANP-BC [facility]       LOS: 5 days    09/15/2019, 10:17 AM  Attending note.  Discussed with Dr. Denton Brick.  Agree with utility of completing GI evaluation with colonoscopy prior to committing to long term oral anticoagulation.  Received a dose of Eliquis this am.  Will re-assess tomorrow morning and hope to get him scheduled for a colonoscopy prior to discharge.

## 2019-09-15 NOTE — Progress Notes (Signed)
Patient Demographics:    Alejandro Porter, is a 44 y.o. male, DOB - 1975-08-03, ATF:[id number]  Admit date - 09/10/2019   Admitting Physician Orson Eva, MD  Outpatient Primary MD for the patient is [facility]  LOS - 5   Chief Complaint  Patient presents with  . Ascites        Subjective:    Alejandro Porter today has no fevers, no emesis,  No chest pain,   - Had dark stools again, heme positive, complains of epigastric discomfort -No bright red blood per rectum, no hematochezia per se   Assessment  & Plan :    Active Problems:   Cirrhosis of liver with ascites (HCC)   Portal vein thrombosis  Brief summary 44 y.o. 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 Child Pugh B. -s/p paracentesis on 09/10/2019 with removal of 8 L -Patient failed outpatient diuresis with oral agents, continue IV Lasix with IV albumin and p.o. Aldactone -GI consult appreciated -he will need vaccination against hep B and hep A  2)Portal vein thrombosis, nonocclusive, extending into right intrahepatic portal vein, first noted at time of CT 08/16/20---  EGD showed grade 1 and grade 2 esophageal varices without stigmata of bleeding -Discussed with Dr. Delton Coombes from hematology -GI service also recommends anticoagulation ---c/n IV heparin, plan to transition to p.o. Eliquis if no bleeding and if platelet counts are stable -For  now platelet counts appear to be drifting down -Platelets were 185 on 08/25/2019,  -Platelets down to 90 K (platelet counts were already drifting down prior to initiation of IV heparin on 09/12/2019 Platelets 185 (08/25/19),  Platelets this admission- 111>>106>>>90>>101>>107 -Monitor platelets and H&H, monitor for bleeding -Risk versus benefit of full anticoagulation discussed with patient, his father and his cousin at bedside----they have decided to go ahead with full anticoagulation  3)H/o COVID 19 infection--diagnosed 08/25/2019, repeat Covid test negative on 09/10/2019 -Asymptomatic,   4)HTN--stopped metoprolol, use propanolol due to moderate portal hypertension  5)Esophageal dysphagia secondary Schatzki's ring.  Patient underwent esophageal dilation on 09/12/2019--- tolerating oral intake well  6)Acute Gi Bleed- -Patient with heme positive stool in setting of esophageal varices and need for anticoagulation for portal vein thrombosis -Discussed with GI service anticipate colonoscopy  Disposition/Need for in-Hospital Stay- patient unable to be discharged at this time due to ---decompensated liver cirrhosis requiring aggressive iv diuresis and portal vein thrombosis requiring IV heparin -Patient From: Home D/C Place: Home Barriers: Not Clinically Stable- --Patient failed outpatient diuresis with oral agents, continue IV Lasix and IV heparin -Patient with heme positive stools, needs colonoscopy prior to decision on whether full anticoagulation can be continued upon discharge  Code Status : Full code  Family Communication:   (patient is alert, awake and coherent) Cousin and father at bedside  Consults  :  GI/phone consult with Dr. Delton Coombes from hematology  Procedures:-  1) paracentesis on 09/10/2019 with removal of 8 L 2)EGD on 09/12/2019- Small hiatal hernia. Mild erosive reflux esophagitis. Schatzki's ring - status post dilation   - Small grade 1/grade 2 esophageal varices without  bleeding  stigmata - Portal hypertensive gastropathy  DVT Prophylaxis  : -IV heparin  Lab Results  Component Value Date   PLT 107 (L) 09/15/2019   Inpatient Medications  Scheduled Meds: . furosemide  40 mg Intravenous BID  . gabapentin  300 mg Oral QHS  . lidocaine (PF)  5 mL Intradermal Once  . polyethylene glycol  17 g Oral Daily  . propranolol  20 mg Oral TID  . spironolactone  100 mg Oral Q breakfast   Continuous Infusions: . albumin human Stopped (09/15/19 1816)  . heparin     PRN Meds:.  Anti-infectives (From admission, onward)   None       Objective:   Vitals:   09/14/19 2202 09/15/19 0548 09/15/19 0849 09/15/19 1630  BP: 105/70 106/70 120/84 121/72  Pulse: 82 88  69  Resp: 17 17    Temp: 98.2 F (36.8 C) 98.1 F (36.7 C)  98.2 F (36.8 C)  TempSrc:      SpO2: 97% 97%  98%  Weight:  124.3 kg    Height:        Wt Readings from Last 3 Encounters:  09/15/19 124.3 kg  09/10/19 (!) 142.9 kg  08/25/19 133.8 kg     Intake/Output Summary (Last 24 hours) at 09/15/2019 1842 Last data filed at 09/15/2019 1800 Gross per 24 hour  Intake 1402.29 ml  Output 1300 ml  Net 102.29 ml    Physical Exam  Gen:- Awake Alert, chronically ill-appearing  HEENT:- [place].AT,   Neck-Supple Neck,No JVD,.  Lungs-  CTAB , fair symmetrical air movement CV- S1, S2 normal, regular  Abd-diminished bowel sounds, no significant tenderness, ascites with fluid thrill noted,    Extremity/Skin:-2+ pitting edema, pedal pulses present  Psych-affect is appropriate, oriented x3 Neuro-generalized weakness, no new focal deficits, no tremors   Data Review:   Micro Results Recent Results (from the past 240 hour(s))  Culture, body fluid-bottle     Status: None   Collection Time: 09/10/19 10:27 AM   Specimen: Ascitic  Result Value Ref Range Status   Specimen Description ASCITIC  Final   Special Requests BOTTLES DRAWN AEROBIC AND ANAEROBIC 10CC  Final   Culture   Final    NO GROWTH 5  DAYS Performed at [facility], [address], [place], [place] [address]    Report Status 09/15/2019 FINAL  Final  Gram stain     Status: None   Collection Time: 09/10/19 10:27 AM   Specimen: Ascitic  Result Value Ref Range Status   Specimen Description ASCITIC  Final   Special Requests NONE  Final   Gram Stain   Final    NO ORGANISMS SEEN WBC PRESENT, PREDOMINANTLY MONONUCLEAR CYTOSPIN SMEAR Performed at [facility], [address], [place],  [address]    Report Status 09/10/2019 FINAL  Final  SARS CORONAVIRUS 2 (TAT 6-24 HRS) Nasopharyngeal Nasopharyngeal Swab     Status: None   Collection Time: 09/10/19 12:09 PM   Specimen: Nasopharyngeal Swab  Result Value Ref Range Status   SARS Coronavirus 2 NEGATIVE NEGATIVE Final    Comment: (NOTE) SARS-CoV-2 target nucleic acids are NOT DETECTED. The SARS-CoV-2 RNA is generally detectable in upper and lower respiratory specimens during the acute phase of infection. Negative results do not preclude  SARS-CoV-2 infection, do not rule out co-infections with other pathogens, and should not be used as the sole basis for treatment or other patient management decisions. Negative results must be combined with clinical observations, patient history, and epidemiological information. The expected result is Negative. Fact Sheet for Patients: SugarRoll.be Fact Sheet for Healthcare Providers: https://www.woods-mathews.com/ This test is not yet approved or cleared by the [place] FDA and  has been authorized for detection and/or diagnosis of SARS-CoV-2 by FDA under an Emergency Use Authorization (EUA). This EUA will remain  in effect (meaning this test can be used) for the duration of the COVID-19 declaration under Section 56 4(b)(1) of the Act, 21 U.S.C. section 360bbb-3(b)(1), unless the authorization is terminated or revoked sooner. Performed at [facility], [place] [address],  [place], [place] [address]     Radiology Reports DG Chest [place] 1 View  Result Date: 09/10/2019 CLINICAL DATA:  Shortness of breath. EXAM: PORTABLE CHEST 1 VIEW COMPARISON:  August 25, 2019. FINDINGS: Stable cardiomediastinal silhouette. No pneumothorax is noted. Increased left basilar atelectasis or infiltrate is noted. Minimal right basilar subsegmental atelectasis or infiltrate is noted. Bony thorax is unremarkable. IMPRESSION: Increased bibasilar subsegmental atelectasis or infiltrates, left greater than right. Electronically Signed   By: Marijo Conception M.D.   On: 09/10/2019 10:14   DG Chest Portable 1 View  Result Date: 08/25/2019 CLINICAL DATA:  Shortness of breath. Abdominal swelling and leg swelling. EXAM: PORTABLE CHEST 1 VIEW COMPARISON:  08/17/2019 FINDINGS: The heart size is within normal limits. Slight pulmonary vascular prominence. Minimal atelectasis at the left lung base. No pulmonary edema or visible pleural effusions. No bone abnormality. IMPRESSION: Slight pulmonary vascular prominence. Electronically Signed   By: Lorriane Shire M.D.   On: 08/25/2019 13:12   ECHOCARDIOGRAM COMPLETE  Result Date: 09/10/2019    ECHOCARDIOGRAM REPORT   Patient Name:   ARLISS FRISINA Date of Exam: 09/10/2019 Medical Rec #:  [id number]       Height:       69.0 in Accession #:    [id number]      Weight:       315.0 lb Date of Birth:  01-15-1976        BSA:          2.506 m Patient Age:    50 years        BP:           134/94 mmHg Patient Gender: M               HR:           92 bpm. Exam Location:  [facility] Procedure: 2D Echo Indications:    Dyspnea 786.09 / R06.00  History:        Patient has no prior history of Echocardiogram examinations.                 Risk Factors:Non-Smoker and Hypertension. Edema, Portal vein                 thrombosis, History of COVID 19.  Sonographer:    Leavy Cella RDCS (AE) Referring Phys: [id number] DAVID TAT IMPRESSIONS  1. Left ventricular ejection fraction, by estimation, is 60 to  65%. The left ventricle has normal function. The left ventricle has no regional wall motion abnormalities. Left ventricular diastolic parameters are indeterminate.  2. Right ventricular systolic function is normal. The right ventricular size is normal.  3. The mitral valve was not well visualized. No  evidence of mitral valve regurgitation. No evidence of mitral stenosis.  4. The aortic valve was not well visualized. Aortic valve regurgitation is not visualized. No aortic stenosis is present. FINDINGS  Left Ventricle: Left ventricular ejection fraction, by estimation, is 60 to 65%. The left ventricle has normal function. The left ventricle has no regional wall motion abnormalities. The left ventricular internal cavity size was normal in size. There is  no left ventricular hypertrophy. Left ventricular diastolic parameters are indeterminate. Right Ventricle: The right ventricular size is normal. No increase in right ventricular wall thickness. Right ventricular systolic function is normal. Left Atrium: Left atrial size was normal in size. Right Atrium: Right atrial size was normal in size. Pericardium: There is no evidence of pericardial effusion. Mitral Valve: The mitral valve was not well visualized. No evidence of mitral valve regurgitation. No evidence of mitral valve stenosis. Tricuspid Valve: The tricuspid valve is normal in structure. Tricuspid valve regurgitation is not demonstrated. No evidence of tricuspid stenosis. Aortic Valve: The aortic valve was not well visualized. Aortic valve regurgitation is not visualized. No aortic stenosis is present. Aortic valve mean gradient measures 5.2 mmHg. Aortic valve peak gradient measures 9.3 mmHg. Aortic valve area, by VTI measures 2.81 cm. Pulmonic Valve: The pulmonic valve was not well visualized. Pulmonic valve regurgitation is not visualized. No evidence of pulmonic stenosis. Aorta: The aortic root was not well visualized. Pulmonary Artery: Indeterminate PASP,  inadequate TR jet. Venous: The inferior vena cava was not well visualized. IAS/Shunts: The interatrial septum was not well visualized. Additional Comments: Mild ascites is present.  LEFT VENTRICLE PLAX 2D LVIDd:         4.96 cm  Diastology LVIDs:         3.02 cm  LV e' lateral:   14.80 cm/s LV PW:         0.90 cm  LV E/e' lateral: 3.8 LV IVS:        0.86 cm  LV e' medial:    8.16 cm/s LVOT diam:     2.30 cm  LV E/e' medial:  6.9 LV SV:         86 LV SV Index:   34 LVOT Area:     4.15 cm  RIGHT VENTRICLE RV S prime:     12.60 cm/s TAPSE (M-mode): 2.8 cm LEFT ATRIUM             Index LA diam:        4.30 cm 1.72 cm/m LA Vol (A2C):   49.2 ml 19.63 ml/m LA Vol (A4C):   38.5 ml 15.36 ml/m LA Biplane Vol: 47.3 ml 18.87 ml/m  AORTIC VALVE AV Area (Vmax):    2.62 cm AV Area (Vmean):   2.50 cm AV Area (VTI):     2.81 cm AV Vmax:           152.23 cm/s AV Vmean:          107.685 cm/s AV VTI:            0.307 m AV Peak Grad:      9.3 mmHg AV Mean Grad:      5.2 mmHg LVOT Vmax:         95.98 cm/s LVOT Vmean:        64.882 cm/s LVOT VTI:          0.208 m LVOT/AV VTI ratio: 0.68  AORTA Ao Root diam: 3.40 cm MITRAL VALVE MV Area (PHT): 3.81 cm    SHUNTS  MV Decel Time: 199 msec    Systemic VTI:  0.21 m MV E velocity: 56.10 cm/s  Systemic Diam: 2.30 cm MV A velocity: 54.80 cm/s MV E/A ratio:  1.02 Carlyle Dolly MD Electronically signed by Carlyle Dolly MD Signature Date/Time: 09/10/2019/4:56:54 PM    Final    US LIVER DOPPLER  Result Date: 09/11/2019 CLINICAL DATA:  Cirrhosis, ascites, nonocclusive portal thrombus by CT EXAM: DUPLEX ULTRASOUND OF LIVER TECHNIQUE: Color and duplex Doppler ultrasound was performed to evaluate the hepatic in-flow and out-flow vessels. COMPARISON:  08/17/2019 CT FINDINGS: Liver: Increased echogenicity with surface nodularity compatible with hepatic cirrhosis. No focal lesion, mass or intrahepatic biliary ductal dilatation. Main Portal Vein size: 2.7 cm Portal Vein Velocities Main Prox:  35  cm/sec Main Mid: 39 cm/sec Main Dist:  33 cm/sec Right: 26 cm/sec Left: 23 cm/sec Hepatic Vein Velocities Right:  40 cm/sec Middle:  27 cm/sec Left:  46 cm/sec IVC: Present and patent with normal respiratory phasicity. Hepatic Artery Velocity:  37 cm/sec Splenic Vein Velocity:  18 cm/sec Spleen: 18 cm x 5.5 cm x 22 cm with a total volume of 1163 cm^3 (411 cm^3 is upper limit normal) Portal Vein Occlusion/Thrombus: Nonocclusive hypoechoic thrombus along the main portal vein wall posteriorly extending into the right portal vein. This correlates with the CT. Splenic Vein Occlusion/Thrombus: No Ascites: Moderate ascites present. Varices: None IMPRESSION: Nonocclusive portal vein thrombus within the main portal vein extending into the right portal vein. Hepatopetal flow is maintained. Hepatic cirrhosis and ascites.  Associated splenomegaly. Electronically Signed   By: Jerilynn Mages.  Shick M.D.   On: 09/11/2019 16:02     CBC Recent Labs  Lab 09/10/19 1025 09/11/19 0535 09/13/19 0551 09/14/19 0512 09/15/19 0448  WBC 6.7 5.8 4.4 4.3 4.6  HGB 12.1* 12.1* 10.5* 11.3* 10.7*  HCT 37.5* 38.4* 34.1* 36.3* 34.0*  PLT 111* 106* 90* 101* 107*  MCV 92.1 93.4 94.7 93.3 93.7  MCH 29.7 29.4 29.2 29.0 29.5  MCHC 32.3 31.5 30.8 31.1 31.5  RDW 14.2 14.3 13.8 14.2 14.1  LYMPHSABS 0.6*  --   --   --   --   MONOABS 0.7  --   --   --   --   EOSABS 0.2  --   --   --   --   BASOSABS 0.0  --   --   --   --     Chemistries  Recent Labs  Lab 09/10/19 1025 09/11/19 0535 09/13/19 0551 09/14/19 0512 09/15/19 0448  NA 137 139 138 139 138  K 3.6 4.0 4.4 3.7 3.8  CL 102 104 101 101 101  CO2 27 30 31 30 30   GLUCOSE 103* 88 78 85 88  BUN 13 13 12 11 15   CREATININE 0.72 0.72 0.70 0.72 0.76  CALCIUM 8.3* 8.3* 8.5* 8.6* 8.3*  AST 23 23 18   --  21  ALT 17 16 12   --  12  ALKPHOS 80 70 53  --  54  BILITOT 0.7 1.3* 1.1  --  0.6    ------------------------------------------------------------------------------------------------------------------ No results for input(s): CHOL, HDL, LDLCALC, TRIG, CHOLHDL, LDLDIRECT in the last 72 hours.  No results found for: HGBA1C ------------------------------------------------------------------------------------------------------------------ No results for input(s): TSH, T4TOTAL, T3FREE, THYROIDAB in the last 72 hours.  Invalid input(s): FREET3 ------------------------------------------------------------------------------------------------------------------ No results for input(s): VITAMINB12, FOLATE, FERRITIN, TIBC, IRON, RETICCTPCT in the last 72 hours.  Coagulation profile Recent Labs  Lab 09/10/19 1025 09/10/19 1526  INR 1.2 1.2  No results for input(s): DDIMER in the last 72 hours.  Cardiac Enzymes No results for input(s): CKMB, TROPONINI, MYOGLOBIN in the last 168 hours.  Invalid input(s): CK ------------------------------------------------------------------------------------------------------------------    Component Value Date/Time   BNP 44.0 08/25/2019 1329     Nakul Avino M.D on 09/15/2019 at 6:42 PM  Go to [email or web address] - for contact info  [facility] - Office  [phone]

## 2019-09-16 ENCOUNTER — Ambulatory Visit: Payer: Self-pay | Admitting: Nurse Practitioner

## 2019-09-16 ENCOUNTER — Other Ambulatory Visit: Payer: Self-pay

## 2019-09-16 LAB — HEPARIN LEVEL (UNFRACTIONATED)
Heparin Unfractionated: 1.16 IU/mL — ABNORMAL HIGH (ref 0.30–0.70)
Heparin Unfractionated: 0.92 IU/mL — ABNORMAL HIGH (ref 0.30–0.70)

## 2019-09-16 LAB — CBC
HCT: 35.4 % — ABNORMAL LOW (ref 39.0–52.0)
Hemoglobin: 11.1 g/dL — ABNORMAL LOW (ref 13.0–17.0)
MCH: 29.1 pg (ref 26.0–34.0)
MCHC: 31.4 g/dL (ref 30.0–36.0)
MCV: 92.9 fL (ref 80.0–100.0)
Platelets: 109 10*3/uL — ABNORMAL LOW (ref 150–400)
RBC: 3.81 MIL/uL — ABNORMAL LOW (ref 4.22–5.81)
RDW: 14.3 % (ref 11.5–15.5)
WBC: 4.6 10*3/uL (ref 4.0–10.5)
nRBC: 0 % (ref 0.0–0.2)

## 2019-09-16 LAB — APTT
aPTT: >200 seconds (ref 24–36)
aPTT: >200 seconds (ref 24–36)

## 2019-09-16 LAB — SURGICAL PATHOLOGY

## 2019-09-16 MED ORDER — SPIRONOLACTONE 25 MG PO TABS
100.0000 mg | ORAL_TABLET | Freq: Two times a day (BID) | ORAL | Status: DC
  Administered 2019-09-16 – 2019-09-22 (×12): 100 mg via ORAL
  Filled 2019-09-16 (×12): qty 4

## 2019-09-16 MED ORDER — SODIUM CHLORIDE 0.9 % IV SOLN
2.0000 g | INTRAVENOUS | Status: AC
  Administered 2019-09-16 – 2019-09-20 (×4): 2 g via INTRAVENOUS
  Filled 2019-09-16 (×5): qty 20

## 2019-09-16 NOTE — Progress Notes (Signed)
Subjective: No significant abdominal pain. Reports having some tenderness around his para site. No melena but no BM since yesterday morning. Denies history of brbpr. No nausea or vomiting. Tolerated dinner well last night.   Objective: Vital signs in last 24 hours: Temp:  [98 F (36.7 C)-98.4 F (36.9 C)] 98 F (36.7 C) (04/13 0532) Pulse Rate:  [68-78] 68 (04/13 0532) Resp:  [20] 20 (04/13 0532) BP: (101-121)/(71-77) 101/71 (04/13 0532) SpO2:  [96 %-100 %] 96 % (04/13 0745) Weight:  [150.5 kg] 119.2 kg (04/13 0500) Last BM Date: 09/15/19 General:   Alert and oriented, pleasant, sitting in recliner at bedside.  Head:  Normocephalic and atraumatic. Eyes:  No icterus, sclera clear. Conjuctiva pink.  Heart:  S1, S2 present, no murmurs noted.  Lungs: Clear to auscultation bilaterally, without wheezing, rales, or rhonchi.  Abdomen:  Bowel sounds diminished. Abdomen is distended but not tense. Large AP diameter. Abdominal wall edema. Erythema that blanches on left side of abdomen around para site with increased warmth. No rebound or guarding.  Extremities:  With 3+ bilateral LE edema up to the knees.  Neurologic:  Alert and  oriented x4;  grossly normal neurologically. Psych: Normal mood and affect.  Intake/Output from previous day: 04/12 0701 - 04/13 0700 In: 1744.2 [P.O.:1080; I.V.:564.2; IV Piggyback:100] Out: 1300 [Urine:1300] Intake/Output this shift: Total I/O In: -  Out: 900 [Urine:900]  Lab Results: Recent Labs    09/14/19 0512 09/15/19 0448 09/16/19 0700  WBC 4.3 4.6 4.6  HGB 11.3* 10.7* 11.1*  HCT 36.3* 34.0* 35.4*  PLT 101* 107* 109*   BMET Recent Labs    09/14/19 0512 09/15/19 0448  NA 139 138  K 3.7 3.8  CL 101 101  CO2 30 30  GLUCOSE 85 88  BUN 11 15  CREATININE 0.72 0.76  CALCIUM 8.6* 8.3*   LFT Recent Labs    09/15/19 0448  PROT 6.0*  ALBUMIN 2.7*  AST 21  ALT 12  ALKPHOS 23  BILITOT 0.6   Assessment: 44 year old male with 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   Anasarca/ascites: LVAP in ED on 4/7 with 8 liters removed. Negative cell count for SBP. S/p several doses of IV albumin followed by Lasix and remaining on Lasix 40 mg BID and Aldactone 100 mg daily. He has responded well to albumin but continued with notable anasarca and LE edema. Albumin was restarted on 4/12 with plans for BID dosing for 48 hours followed by IV Lasix. Overall, he is down 53 lbs since admission. Continue current regimen and reassess tomorrow.   Anemia: Normocytic anemia with ferritin 193 likely secondary to chronic disease. Reports of black stool prior to admission and states he noticed this over the weekend and morning of 09/15/19. Heme positive on 09/15/19. Hgb fluctuating but essentially stable with hemoglobin 11.1 today. In setting of anticoagulation, he could easily be oozing from known portal gastropathy/recent biopsy. EGD this admission on 4/9 with 4 columns of barely discernible Grade 1 varices and 2 short columns of grade 2 varices, moderate portal hypertensive gastropathy, scattered gastric erosion, and 8 mm gastric polyp s/p polypectomy with clip placement. Will benefit from colonoscopy prior to committing to long term anticoagulation.   Portal vein thrombosis: Unclear if acute or chronic. Started on anticoagulation this admission with heparin  and received Eliquis morning of 09/15/19. Plans for Eliquis at discharge but this has been put on hold for now due to needing a colonoscopy prior to committing to long term anticoagulation as he does have anemia and heme positive stool on 09/15/19.  Dysphagia: Underwent dilation of Schatzki's ring this admission on 09/12/19 with improvement.   Erythema of abdominal wall: Erythema noted around  para site on left side of his abdomen. Associated warmth and mild tenderness. Per patient, this has been present for several days without change. As this is my first day seeing patient, I have discussed with Dr. Denton Brick and asked him to take a look as well. Question possible cellulitis.   Plan: 1. Needs colonoscopy this admission. Discussed with Dr. Oneida Alar. Recommended full liquid diet today and plan for TCS Thursday.  2. Continue to hold Eliquis for now.  3. Monitor for any overt GI bleeding and for drops in hemoglobin.  4. Continue IV albumin today followed by IV Lasix 1 hour later.  5. Continue aldactone 100 mg daily.  6. 2g sodium diet.  7. Daily weights.  8. Continue to defer anticoagulation management to pharmacy.  9. Hep A and B vaccine as outpatient.  10. Reassess in the morning.    LOS: 6 days    09/16/2019, 8:51 AM   Aliene Altes, PA-C [facility]

## 2019-09-16 NOTE — Progress Notes (Signed)
MD and pharmacist made aware of PTT results. Orders for lab redraw at 1300 scheduled.

## 2019-09-16 NOTE — Progress Notes (Signed)
CRITICAL VALUE ALERT  Critical Value: ptt greater than 200  Date & Time Notied:  4/13 @ 1901  Provider Notified: Maurene Capes  Orders Received/Actions taken: awaiting orders  Deirdre Pippins, RN

## 2019-09-16 NOTE — Progress Notes (Signed)
[place] for apixaban dosing>>>heparin Indication: portal vein thrombosis  No Known Allergies  Patient Measurements: Height: 5' 9"  (175.3 cm) Weight: 119.2 kg (262 lb 12.8 oz) IBW/kg (Calculated) : 70.7 Heparin Dosing Weight:  HEPARIN DW (KG): 104.7  Vital Signs: Temp: 98.2 F (36.8 C) (04/13 1355) Temp Source: Oral (04/13 1355) BP: 106/72 (04/13 1355) Pulse Rate: 66 (04/13 1355)  Labs: Recent Labs    09/14/19 0512 09/14/19 1233 09/15/19 0448 09/15/19 0449 09/16/19 0700 09/16/19 1336  HGB 11.3*  --  10.7*  --  11.1*  --   HCT 36.3*  --  34.0*  --  35.4*  --   PLT 101*  --  107*  --  109*  --   APTT  --   --   --   --  >200* >200*  HEPARINUNFRC 0.49   < >  --  0.36 1.16* 0.92*  CREATININE 0.72  --  0.76  --   --   --    < > = values in this interval not displayed.    Estimated Creatinine Clearance: 150.2 mL/min (by C-G formula based on SCr of 0.76 mg/dL).  Medical History: Past Medical History:  Diagnosis Date  . Hypertension   . Liver cirrhosis ([place])   . Neuropathy     Assessment: Pharmacy consulted to dose heparin for this 44 yo male with portal vein thrombosis.  Upper endoscopy showed no acute bleeding at this time. Patient has low baseline platelets of 106K, but patient has a history of hepatic cirrhosis.  He wasn't on anti-coagulation prior to admission.  Patient received 1 dose of apixaban 10 mg 4/12 1121 before switched back to heparin infusion  HL 0.92- slightly supratherapeutic APTT >200- supratherapeutic  Unclear why heparin level and APTT have little correlation with heparin level being slightly high and APTT being very elevated.  Confirmed with RN that lab draw was on opposite arm of heparin.   MD would like to be cautious with dosing since patient is high bleed risk.   Goal of Therapy: .  HL 0.3-0.7 APTT 66-102 sec Monitor platelets by anticoagulation protocol: Yes   Plan:  Hold heparin infusion for 1  hour. Decrease heparin infusion to 2100 units/hr. Heparin level/aPTT in 6 hours and daily. Continue to monitor H&H and platelets.   Margot Ables, PharmD Clinical Pharmacist 09/16/2019 2:41 PM

## 2019-09-16 NOTE — Progress Notes (Signed)
Patient Demographics:    Alejandro Porter, is a 44 y.o. male, DOB - Feb 12, 1976, [id number]  Admit date - 09/10/2019   Admitting Physician Alejandro Eva, MD  Outpatient Primary MD for the patient is [facility]  LOS - 6   Chief Complaint  Patient presents with  . Ascites        Subjective:    Alejandro Porter today has no fevers, no emesis,  No chest pain,   - Left lower abdominal wall discomfort at prior paracentesis site -No further BMs -PTT appears supratherapeutic   Assessment  & Plan :    Active Problems:   Cirrhosis of liver with ascites (HCC)   Portal vein thrombosis  Brief summary 44 y.o. 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 Child Pugh B. -s/p paracentesis on 09/10/2019 with removal of 8 L -Patient failed outpatient diuresis with oral agents, ---continue IV Lasix with IV albumin and p.o. Aldactone -GI consult appreciated -he will need vaccination against hep B and hep A as outpatient  2)Portal vein thrombosis, nonocclusive, extending into right intrahepatic portal vein, first noted at time of CT 08/16/20---  EGD showed grade 1 and grade 2 esophageal varices without stigmata of bleeding -Discussed with Dr. Delton Porter from hematology -GI service also recommends anticoagulation ---c/n IV heparin, plan to transition to p.o. Eliquis if no bleeding and if platelet counts are stable  -Platelets were 185 on 08/25/2019,  -Platelets down to 90 K (platelet counts were already drifting down prior to initiation of IV heparin on 09/12/2019 Platelets 185 (08/25/19),  Platelets this admission- 111>>106>>>90>>101>>107 -Monitor platelets and H&H, monitor for bleeding -Risk versus benefit of full anticoagulation discussed with patient, his father and his cousin at bedside----they have decided to go ahead with full anticoagulation -PTT very elevated >>> 200 may be because he got 1 dose of Eliquis 10 mg on 09/15/2019 in a.m. -Hold heparin and adjust IV heparin infusion  3)H/o COVID 19 infection--diagnosed 08/25/2019, repeat Covid test negative on 09/10/2019 -Asymptomatic,   4)HTN--stopped metoprolol, use propanolol due to moderate portal hypertension  5)Esophageal dysphagia secondary Schatzki's ring.  Patient underwent esophageal dilation on 09/12/2019--- tolerating oral intake well  6)Acute Gi Bleed- -Patient with heme positive stool in setting of esophageal varices and need for anticoagulation for portal vein thrombosis -Discussed with GI service anticipate colonoscopy-on 09/18/2019 after Eliquis washout -- 7) abdominal discomfort post paracentesis--- irritation around left anterior abdominal wall noted ----IV Rocephin for SBP prophylaxis should cover for any infection in this area as well  Disposition/Need for in-Hospital Stay- patient unable to be discharged at this time due to ---decompensated liver cirrhosis requiring aggressive iv diuresis and portal vein thrombosis requiring IV heparin -Patient From: Home D/C Place: Home Barriers: Not Clinically Stable- --Patient failed outpatient diuresis with oral agents, continue IV Lasix and IV heparin -Patient with heme positive stools, needs colonoscopy prior  to decision on whether full anticoagulation can be continued upon discharge -Plan is for colonoscopy on 09/18/2019 continue IV heparin for now  Code Status : Full code  Family  Communication:   (patient is alert, awake and coherent) Cousin and father at bedside  Consults  :  GI/phone consult with Dr. Delton Porter from hematology  Procedures:- 1) paracentesis on 09/10/2019 with removal of 8 L 2)EGD on 09/12/2019- Small hiatal hernia. Mild erosive reflux esophagitis. Schatzki's ring - status post dilation   - Small grade 1/grade 2 esophageal varices without  bleeding stigmata - Portal hypertensive gastropathy 3) colonoscopy planned for 09/18/2019  DVT Prophylaxis  : -IV heparin  Lab Results  Component Value Date   PLT 109 (L) 09/16/2019   Inpatient Medications  Scheduled Meds: . furosemide  40 mg Intravenous BID  . gabapentin  300 mg Oral QHS  . lidocaine (PF)  5 mL Intradermal Once  . polyethylene glycol  17 g Oral Daily  . propranolol  20 mg Oral TID  . spironolactone  100 mg Oral Q breakfast   Continuous Infusions: . albumin human 25 g (09/16/19 1652)  . cefTRIAXone (ROCEPHIN)  IV 2 g (09/16/19 1410)  . heparin 2,100 Units/hr (09/16/19 1744)   PRN Meds:.  Anti-infectives (From admission, onward)   Start     Dose/Rate Route Frequency Ordered Stop   09/16/19 1300  cefTRIAXone (ROCEPHIN) 2 g in sodium chloride 0.9 % 100 mL IVPB     2 g 200 mL/hr over 30 Minutes Intravenous Every 24 hours 09/16/19 1216 09/21/19 1259       Objective:   Vitals:   09/16/19 0745 09/16/19 0920 09/16/19 1355 09/16/19 1706  BP:  122/76 106/72 104/67  Pulse:  81 66   Resp:   20   Temp:   98.2 F (36.8 C)   TempSrc:   Oral   SpO2: 96%  100%   Weight:      Height:        Wt Readings from Last 3 Encounters:  09/16/19 119.2 kg  09/10/19 (!) 142.9 kg  08/25/19 133.8 kg     Intake/Output Summary (Last 24 hours) at 09/16/2019 1752 Last data filed at 09/16/2019 0932 Gross per 24 hour  Intake 841.88 ml  Output 900 ml  Net -58.12 ml    Physical Exam  Gen:- Awake Alert, chronically ill-appearing  HEENT:- [place].AT,   Neck-Supple Neck,No JVD,.  Lungs-  CTAB , fair  symmetrical air movement CV- S1, S2 normal, regular  Abd-diminished bowel sounds, no significant tenderness, ascites with fluid thrill noted, Lt anterior abd wall erythema, tenderness and induration at prior paracentesis site Extremity/Skin:-2+ pitting edema, pedal pulses present  Psych-affect is appropriate, oriented x3 Neuro-generalized weakness, no new focal deficits, no tremors   Data Review:   Micro Results Recent Results (from the past 240 hour(s))  Culture, body fluid-bottle     Status: None   Collection Time: 09/10/19 10:27 AM   Specimen: Ascitic  Result Value Ref Range Status   Specimen Description ASCITIC  Final   Special Requests BOTTLES DRAWN AEROBIC AND ANAEROBIC 10CC  Final   Culture   Final    NO GROWTH 5 DAYS Performed at [facility], [address], [place], [place] [address]    Report Status 09/15/2019 FINAL  Final  Gram stain     Status: None   Collection Time: 09/10/19 10:27 AM   Specimen: Ascitic  Result Value Ref Range Status   Specimen Description ASCITIC  Final  Special Requests NONE  Final   Gram Stain   Final    NO ORGANISMS SEEN WBC PRESENT, PREDOMINANTLY MONONUCLEAR CYTOSPIN SMEAR Performed at [facility], [address] [place], [place] [address]    Report Status 09/10/2019 FINAL  Final  SARS CORONAVIRUS 2 (TAT 6-24 HRS) Nasopharyngeal Nasopharyngeal Swab     Status: None   Collection Time: 09/10/19 12:09 PM   Specimen: Nasopharyngeal Swab  Result Value Ref Range Status   SARS Coronavirus 2 NEGATIVE NEGATIVE Final    Comment: (NOTE) SARS-CoV-2 target nucleic acids are NOT DETECTED. The SARS-CoV-2 RNA is generally detectable in upper and lower respiratory specimens during the acute phase of infection. Negative results do not preclude SARS-CoV-2 infection, do not rule out co-infections with other pathogens, and should not be used as the sole basis for treatment or other patient management decisions. Negative results must be combined with  clinical observations, patient history, and epidemiological information. The expected result is Negative. Fact Sheet for Patients: SugarRoll.be Fact Sheet for Healthcare Providers: https://www.woods-mathews.com/ This test is not yet approved or cleared by the [place] FDA and  has been authorized for detection and/or diagnosis of SARS-CoV-2 by FDA under an Emergency Use Authorization (EUA). This EUA will remain  in effect (meaning this test can be used) for the duration of the COVID-19 declaration under Section 56 4(b)(1) of the Act, 21 U.S.C. section 360bbb-3(b)(1), unless the authorization is terminated or revoked sooner. Performed at [facility], [place] [address], [place], [place] [address]     Radiology Reports DG Chest [facility] 1 View  Result Date: 09/10/2019 CLINICAL DATA:  Shortness of breath. EXAM: PORTABLE CHEST 1 VIEW COMPARISON:  August 25, 2019. FINDINGS: Stable cardiomediastinal silhouette. No pneumothorax is noted. Increased left basilar atelectasis or infiltrate is noted. Minimal right basilar subsegmental atelectasis or infiltrate is noted. Bony thorax is unremarkable. IMPRESSION: Increased bibasilar subsegmental atelectasis or infiltrates, left greater than right. Electronically Signed   By: Marijo Conception M.D.   On: 09/10/2019 10:14   DG Chest Portable 1 View  Result Date: 08/25/2019 CLINICAL DATA:  Shortness of breath. Abdominal swelling and leg swelling. EXAM: PORTABLE CHEST 1 VIEW COMPARISON:  08/17/2019 FINDINGS: The heart size is within normal limits. Slight pulmonary vascular prominence. Minimal atelectasis at the left lung base. No pulmonary edema or visible pleural effusions. No bone abnormality. IMPRESSION: Slight pulmonary vascular prominence. Electronically Signed   By: Lorriane Shire M.D.   On: 08/25/2019 13:12   ECHOCARDIOGRAM COMPLETE  Result Date: 09/10/2019    ECHOCARDIOGRAM REPORT   Patient Name:   Alejandro Porter Date of Exam: 09/10/2019 Medical Rec #:  [id number]       Height:       69.0 in Accession #:    [id number]      Weight:       315.0 lb Date of Birth:  04-02-1976        BSA:          2.506 m Patient Age:    30 years        BP:           134/94 mmHg Patient Gender: M               HR:           92 bpm. Exam Location:  [facility] Procedure: 2D Echo Indications:    Dyspnea 786.09 / R06.00  History:        Patient has no  prior history of Echocardiogram examinations.                 Risk Factors:Non-Smoker and Hypertension. Edema, Portal vein                 thrombosis, History of COVID 19.  Sonographer:    Leavy Cella RDCS (AE) Referring Phys: [id number] DAVID TAT IMPRESSIONS  1. Left ventricular ejection fraction, by estimation, is 60 to 65%. The left ventricle has normal function. The left ventricle has no regional wall motion abnormalities. Left ventricular diastolic parameters are indeterminate.  2. Right ventricular systolic function is normal. The right ventricular size is normal.  3. The mitral valve was not well visualized. No evidence of mitral valve regurgitation. No evidence of mitral stenosis.  4. The aortic valve was not well visualized. Aortic valve regurgitation is not visualized. No aortic stenosis is present. FINDINGS  Left Ventricle: Left ventricular ejection fraction, by estimation, is 60 to 65%. The left ventricle has normal function. The left ventricle has no regional wall motion abnormalities. The left ventricular internal cavity size was normal in size. There is  no left ventricular hypertrophy. Left ventricular diastolic parameters are indeterminate. Right Ventricle: The right ventricular size is normal. No increase in right ventricular wall thickness. Right ventricular systolic function is normal. Left Atrium: Left atrial size was normal in size. Right Atrium: Right atrial size was normal in size. Pericardium: There is no evidence of pericardial effusion. Mitral Valve: The mitral valve was  not well visualized. No evidence of mitral valve regurgitation. No evidence of mitral valve stenosis. Tricuspid Valve: The tricuspid valve is normal in structure. Tricuspid valve regurgitation is not demonstrated. No evidence of tricuspid stenosis. Aortic Valve: The aortic valve was not well visualized. Aortic valve regurgitation is not visualized. No aortic stenosis is present. Aortic valve mean gradient measures 5.2 mmHg. Aortic valve peak gradient measures 9.3 mmHg. Aortic valve area, by VTI measures 2.81 cm. Pulmonic Valve: The pulmonic valve was not well visualized. Pulmonic valve regurgitation is not visualized. No evidence of pulmonic stenosis. Aorta: The aortic root was not well visualized. Pulmonary Artery: Indeterminate PASP, inadequate TR jet. Venous: The inferior vena cava was not well visualized. IAS/Shunts: The interatrial septum was not well visualized. Additional Comments: Mild ascites is present.  LEFT VENTRICLE PLAX 2D LVIDd:         4.96 cm  Diastology LVIDs:         3.02 cm  LV e' lateral:   14.80 cm/s LV PW:         0.90 cm  LV E/e' lateral: 3.8 LV IVS:        0.86 cm  LV e' medial:    8.16 cm/s LVOT diam:     2.30 cm  LV E/e' medial:  6.9 LV SV:         86 LV SV Index:   34 LVOT Area:     4.15 cm  RIGHT VENTRICLE RV S prime:     12.60 cm/s TAPSE (M-mode): 2.8 cm LEFT ATRIUM             Index LA diam:        4.30 cm 1.72 cm/m LA Vol (A2C):   49.2 ml 19.63 ml/m LA Vol (A4C):   38.5 ml 15.36 ml/m LA Biplane Vol: 47.3 ml 18.87 ml/m  AORTIC VALVE AV Area (Vmax):    2.62 cm AV Area (Vmean):   2.50 cm AV Area (VTI):     2.81  cm AV Vmax:           152.23 cm/s AV Vmean:          107.685 cm/s AV VTI:            0.307 m AV Peak Grad:      9.3 mmHg AV Mean Grad:      5.2 mmHg LVOT Vmax:         95.98 cm/s LVOT Vmean:        64.882 cm/s LVOT VTI:          0.208 m LVOT/AV VTI ratio: 0.68  AORTA Ao Root diam: 3.40 cm MITRAL VALVE MV Area (PHT): 3.81 cm    SHUNTS MV Decel Time: 199 msec    Systemic  VTI:  0.21 m MV E velocity: 56.10 cm/s  Systemic Diam: 2.30 cm MV A velocity: 54.80 cm/s MV E/A ratio:  1.02 Carlyle Dolly MD Electronically signed by Carlyle Dolly MD Signature Date/Time: 09/10/2019/4:56:54 PM    Final    US LIVER DOPPLER  Result Date: 09/11/2019 CLINICAL DATA:  Cirrhosis, ascites, nonocclusive portal thrombus by CT EXAM: DUPLEX ULTRASOUND OF LIVER TECHNIQUE: Color and duplex Doppler ultrasound was performed to evaluate the hepatic in-flow and out-flow vessels. COMPARISON:  08/17/2019 CT FINDINGS: Liver: Increased echogenicity with surface nodularity compatible with hepatic cirrhosis. No focal lesion, mass or intrahepatic biliary ductal dilatation. Main Portal Vein size: 2.7 cm Portal Vein Velocities Main Prox:  35 cm/sec Main Mid: 39 cm/sec Main Dist:  33 cm/sec Right: 26 cm/sec Left: 23 cm/sec Hepatic Vein Velocities Right:  40 cm/sec Middle:  27 cm/sec Left:  46 cm/sec IVC: Present and patent with normal respiratory phasicity. Hepatic Artery Velocity:  37 cm/sec Splenic Vein Velocity:  18 cm/sec Spleen: 18 cm x 5.5 cm x 22 cm with a total volume of 1163 cm^3 (411 cm^3 is upper limit normal) Portal Vein Occlusion/Thrombus: Nonocclusive hypoechoic thrombus along the main portal vein wall posteriorly extending into the right portal vein. This correlates with the CT. Splenic Vein Occlusion/Thrombus: No Ascites: Moderate ascites present. Varices: None IMPRESSION: Nonocclusive portal vein thrombus within the main portal vein extending into the right portal vein. Hepatopetal flow is maintained. Hepatic cirrhosis and ascites.  Associated splenomegaly. Electronically Signed   By: Jerilynn Mages.  Shick M.D.   On: 09/11/2019 16:02     CBC Recent Labs  Lab 09/10/19 1025 09/10/19 1025 09/11/19 0535 09/13/19 0551 09/14/19 0512 09/15/19 0448 09/16/19 0700  WBC 6.7   < > 5.8 4.4 4.3 4.6 4.6  HGB 12.1*   < > 12.1* 10.5* 11.3* 10.7* 11.1*  HCT 37.5*   < > 38.4* 34.1* 36.3* 34.0* 35.4*  PLT 111*   < >  106* 90* 101* 107* 109*  MCV 92.1   < > 93.4 94.7 93.3 93.7 92.9  MCH 29.7   < > 29.4 29.2 29.0 29.5 29.1  MCHC 32.3   < > 31.5 30.8 31.1 31.5 31.4  RDW 14.2   < > 14.3 13.8 14.2 14.1 14.3  LYMPHSABS 0.6*  --   --   --   --   --   --   MONOABS 0.7  --   --   --   --   --   --   EOSABS 0.2  --   --   --   --   --   --   BASOSABS 0.0  --   --   --   --   --   --    < > =  values in this interval not displayed.    Chemistries  Recent Labs  Lab 09/10/19 1025 09/11/19 0535 09/13/19 0551 09/14/19 0512 09/15/19 0448  NA 137 139 138 139 138  K 3.6 4.0 4.4 3.7 3.8  CL 102 104 101 101 101  CO2 27 30 31 30 30   GLUCOSE 103* 88 78 85 88  BUN 13 13 12 11 15   CREATININE 0.72 0.72 0.70 0.72 0.76  CALCIUM 8.3* 8.3* 8.5* 8.6* 8.3*  AST 23 23 18   --  21  ALT 17 16 12   --  12  ALKPHOS 80 70 53  --  54  BILITOT 0.7 1.3* 1.1  --  0.6   ------------------------------------------------------------------------------------------------------------------ No results for input(s): CHOL, HDL, LDLCALC, TRIG, CHOLHDL, LDLDIRECT in the last 72 hours.  No results found for: HGBA1C ------------------------------------------------------------------------------------------------------------------ No results for input(s): TSH, T4TOTAL, T3FREE, THYROIDAB in the last 72 hours.  Invalid input(s): FREET3 ------------------------------------------------------------------------------------------------------------------ No results for input(s): VITAMINB12, FOLATE, FERRITIN, TIBC, IRON, RETICCTPCT in the last 72 hours.  Coagulation profile Recent Labs  Lab 09/10/19 1025 09/10/19 1526  INR 1.2 1.2    No results for input(s): DDIMER in the last 72 hours.  Cardiac Enzymes No results for input(s): CKMB, TROPONINI, MYOGLOBIN in the last 168 hours.  Invalid input(s): CK ------------------------------------------------------------------------------------------------------------------    Component Value  Date/Time   BNP 44.0 08/25/2019 1329    Marisol Giambra M.D on 09/16/2019 at 5:52 PM  Go to [email or web address] - for contact info  [facility] - Office  [phone]

## 2019-09-17 LAB — BASIC METABOLIC PANEL
Anion gap: 6 (ref 5–15)
BUN: 17 mg/dL (ref 6–20)
CO2: 27 mmol/L (ref 22–32)
Calcium: 8.4 mg/dL — ABNORMAL LOW (ref 8.9–10.3)
Chloride: 102 mmol/L (ref 98–111)
Creatinine, Ser: 0.68 mg/dL (ref 0.61–1.24)
GFR calc Af Amer: >60 mL/min (ref >60)
GFR calc non Af Amer: >60 mL/min (ref >60)
Glucose, Bld: 95 mg/dL (ref 70–99)
Potassium: 3.9 mmol/L (ref 3.5–5.1)
Sodium: 135 mmol/L (ref 135–145)

## 2019-09-17 LAB — CBC
HCT: 29.3 % — ABNORMAL LOW (ref 39.0–52.0)
Hemoglobin: 9.4 g/dL — ABNORMAL LOW (ref 13.0–17.0)
MCH: 29.7 pg (ref 26.0–34.0)
MCHC: 32.1 g/dL (ref 30.0–36.0)
MCV: 92.7 fL (ref 80.0–100.0)
Platelets: 99 10*3/uL — ABNORMAL LOW (ref 150–400)
RBC: 3.16 MIL/uL — ABNORMAL LOW (ref 4.22–5.81)
RDW: 14.2 % (ref 11.5–15.5)
WBC: 4.1 10*3/uL (ref 4.0–10.5)
nRBC: 0 % (ref 0.0–0.2)

## 2019-09-17 LAB — APTT
aPTT: 163 seconds (ref 24–36)
aPTT: 164 seconds (ref 24–36)

## 2019-09-17 LAB — HEPARIN LEVEL (UNFRACTIONATED)
Heparin Unfractionated: 0.49 IU/mL (ref 0.30–0.70)
Heparin Unfractionated: 0.51 IU/mL (ref 0.30–0.70)

## 2019-09-17 MED ORDER — PEG 3350-KCL-NA BICARB-NACL 420 G PO SOLR
4000.0000 mL | Freq: Once | ORAL | Status: AC
  Administered 2019-09-17: 4000 mL via ORAL

## 2019-09-17 NOTE — Progress Notes (Signed)
MD and pharmacist notified of PTT 163. New order per pharmacist to continue Heparin 21 ml/hr.

## 2019-09-17 NOTE — Progress Notes (Signed)
[place] for Apixaban>>>Heparin Indication: portal vein thrombosis  No Known Allergies  Patient Measurements: Height: 5' 9"  (175.3 cm) Weight: 119.2 kg (262 lb 12.8 oz) IBW/kg (Calculated) : 70.7 Heparin Dosing Weight:  HEPARIN DW (KG): 104.7  Vital Signs: Temp: 98.3 F (36.8 C) (04/13 2251) Temp Source: Oral (04/13 2251) BP: 113/71 (04/13 2251) Pulse Rate: 91 (04/13 2251)  Labs: Recent Labs    09/14/19 0512 09/14/19 1233 09/15/19 0448 09/15/19 0449 09/16/19 0700 09/16/19 1336 09/17/19 0000  HGB 11.3*  --  10.7*   < > 11.1*  --  9.4*  HCT 36.3*  --  34.0*  --  35.4*  --  29.3*  PLT 101*  --  107*  --  109*  --  99*  APTT  --   --   --   --  >200* >200* 163*  HEPARINUNFRC 0.49   < >  --    < > 1.16* 0.92* 0.49  CREATININE 0.72  --  0.76  --   --   --  0.68   < > = values in this interval not displayed.    Estimated Creatinine Clearance: 150.2 mL/min (by C-G formula based on SCr of 0.68 mg/dL).  Medical History: Past Medical History:  Diagnosis Date  . Hypertension   . Liver cirrhosis ([place])   . Neuropathy     Assessment: Pharmacy consulted to dose heparin for this 44 yo male with portal vein thrombosis.  Upper endoscopy showed no acute bleeding at this time. Patient has low baseline platelets of 106K, but patient has a history of hepatic cirrhosis.  He wasn't on anti-coagulation prior to admission.  Patient received 1 dose of apixaban 10 mg 4/12 1121 before switched back to heparin infusion  Unclear why heparin level and APTT have little correlation with one another-at this point would use heparin level to dose   MD would like to be cautious with dosing since patient is high bleed risk.  4/14 AM update:  Heparin level is therapeutic after rate decrease   Goal of Therapy: .  HL 0.3-0.7 Monitor platelets by anticoagulation protocol: Yes   Plan:  -Cont heparin drip at 2100 units/hr -Confirmatory heparin level at  [place], PharmD, Conception Pharmacist Phone: [phone]

## 2019-09-17 NOTE — Progress Notes (Signed)
CRITICAL VALUE ALERT  Critical Value:  PTT 164  Date & Time Notied:  09/17/2019 0844  Provider Notified: Dr. Roxan Hockey   Orders Received/Actions taken: awaiting/ call back orders

## 2019-09-17 NOTE — Progress Notes (Signed)
ANTICOAGULATION CONSULT NOTE  Pharmacy Consult for Apixaban>>>Heparin Indication: portal vein thrombosis  No Known Allergies  Patient Measurements: Height: 5' 9"  (175.3 cm) Weight: 115.7 kg (255 lb) IBW/kg (Calculated) : 70.7 Heparin Dosing Weight:  HEPARIN DW (KG): 104.7  Vital Signs: Temp: 98.6 F (37 C) (04/14 0633) Temp Source: Oral (04/14 9024) BP: 107/71 (04/14 0973) Pulse Rate: 70 (04/14 0633)  Labs: Recent Labs    09/15/19 0448 09/15/19 0449 09/16/19 0700 09/16/19 0700 09/16/19 1336 09/17/19 0000 09/17/19 0710 09/17/19 0848  HGB 10.7*   < > 11.1*  --   --  9.4*  --   --   HCT 34.0*  --  35.4*  --   --  29.3*  --   --   PLT 107*  --  109*  --   --  99*  --   --   APTT  --   --  >200*   < > >200* 163* 164*  --   HEPARINUNFRC  --    < > 1.16*   < > 0.92* 0.49  --  0.51  CREATININE 0.76  --   --   --   --  0.68  --   --    < > = values in this interval not displayed.    Estimated Creatinine Clearance: 147.8 mL/min (by C-G formula based on SCr of 0.68 mg/dL).  Medical History: Past Medical History:  Diagnosis Date  . Hypertension   . Liver cirrhosis ([place])   . Neuropathy     Assessment: Pharmacy consulted to dose heparin for this 44 yo male with portal vein thrombosis.  Upper endoscopy showed no acute bleeding at this time. Patient has low baseline platelets of 106K, but patient has a history of hepatic cirrhosis.  He wasn't on anti-coagulation prior to admission.  Patient received 1 dose of apixaban 10 mg 4/12 1121 before switched back to heparin infusion  Unclear why heparin level and APTT have little correlation with one another-at this point would use heparin level to dose   MD would like to be cautious with dosing and keep heparin levels between 0.3-0.5 since high bleed risk  Heparin level 0.51- therapeutic but will lower slightly to keep on lower end   Goal of Therapy: .  HL 0.3-0.7 (MD would like lower end so aim for 0.3-0.5) Monitor platelets by  anticoagulation protocol: Yes   Plan:  Heparin drip at 2000 units/hr Daily heparin level and CBC Monitor for s/s of bleeding.  Margot Ables, PharmD Clinical Pharmacist 09/17/2019 10:21 AM

## 2019-09-17 NOTE — Progress Notes (Addendum)
Subjective: Had 1 black stool last night and 1 black stool this morning around 11:30 am. No nausea or vomiting. Mild left sided abdominal tenderness is improving. Feels swelling is improving.   Objective: Vital signs in last 24 hours: Temp:  [98.1 F (36.7 C)-98.6 F (37 C)] 98.1 F (36.7 C) (04/14 1456) Pulse Rate:  [63-91] 63 (04/14 1456) Resp:  [18-20] 18 (04/14 1456) BP: (104-126)/(67-82) 108/82 (04/14 1456) SpO2:  [95 %-100 %] 100 % (04/14 1456) Weight:  [115.7 kg] 115.7 kg (04/14 0633) Last BM Date: 09/16/19 General:   Alert and oriented, pleasant Head:  Normocephalic and atraumatic. Eyes:  No icterus, sclera clear. Conjuctiva pink.  Abdomen:  Bowel sounds present. Abdomen is distended but not tense. Abdominal wawll edema with mild improvement. Erythema of left side of his abdomen is improving. Associated mild tenderness in this area is improving. No rebound or guarding.  Msk:  Symmetrical without gross deformities. Normal posture. Extremities:  With 3+ bilateral LE edema below the knees, seems to be slightly improved since yesterday. Neurologic:  Alert and  oriented x4;  grossly normal neurologically. Psych:  Normal mood and affect.  Intake/Output from previous day: 04/13 0701 - 04/14 0700 In: 400 [P.O.:400] Out: 900 [Urine:900] Intake/Output this shift: Total I/O In: 480 [P.O.:480] Out: -   Lab Results: Recent Labs    09/15/19 0448 09/16/19 0700 09/17/19 0000  WBC 4.6 4.6 4.1  HGB 10.7* 11.1* 9.4*  HCT 34.0* 35.4* 29.3*  PLT 107* 109* 99*   BMET Recent Labs    09/15/19 0448 09/17/19 0000  NA 138 135  K 3.8 3.9  CL 101 102  CO2 30 27  GLUCOSE 88 95  BUN 15 17  CREATININE 0.76 0.68  CALCIUM 8.3* 8.4*   LFT Recent Labs    09/15/19 0448  PROT 6.0*  ALBUMIN 2.7*  AST 21  ALT 12  ALKPHOS 73  BILITOT 0.6    Assessment: 44 year old male with likely NASH cirrhosis MELD 8, admitted with decompensation to include anasarca/ascites and failing  outpatient diuretic regimen, found to have portal vein thrombosis and now started on anticoagulation with heparin and received 1 dose Eliquis morning of 09/15/19.   Anasarca/ascites: LVAP in ED on 4/7 with 8 liters removed. Negative cell count for SBP. S/p several doses of IV albumin followed by Lasix with last Albumin this morning. Also on spirolactone which was increased to 100 mg BID today. Will need to monitor potassium. Overall down 60 lbs since admission. Continue diuresis.  Anemia: Normocytic anemia with ferritin 193 likely secondary to chronic disease. Reports of intermittent black stools prior to admission with one episode last night and one this morning around 11:30 AM.  He was heme positive on 09/15/2019.  Hemoglobin has been fluctuating this admission and is down to 9.4 today from 11.1 yesterday. 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  He has been on anticoagulation with heparin due to portal vein thrombosis and may be intermittently oozing from known portal gastropathy/recent polypectomy.  Plans for colonoscopy tomorrow for complete evaluation of heme positive stool.  Portal vein thrombosis: Unclear if acute or chronic. Started on anticoagulation this admission with heparin and received Eliquis morning of 09/15/19. Plans for Eliquis at discharge but this has been put on hold for now due intermittent melena, anemia, and heme positive stool. 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 Plans for colonoscopy on 4/15 for complete evaluation of heme positive stool.  Patient may be continuing to ooze from an upper GI source in the  setting of anticoagulation. Outpatient anticoagulation may prove to be difficult if he continues to ooze from upper GI source. Further recommendations following TCS tomorrow.     Erythema of abdominal wall: Erythema noted around para site on left side of his abdomen. Associated warmth and mild tenderness. Started on Rocephin yesterday. Continue antibiotics.    Plan: 1.  Plan for colonoscopy 4/15 with Dr. Oneida Alar. The risks, benefits, and alternatives have been discussed in detail with patient. They have stated understanding and desire to proceed. We will need to hold heparin 2 hours prior to colonoscopy. 2.  Clear liquid diet today. 3.  N.p.o. at midnight. 4.  Continue to monitor H&H. 5.  Continue monitoring for overt GI bleeding. 6.  Continue Lasix twice daily. 7.  Continue Aldactone 100 mg twice daily. 8.  Monitor electrolytes and kidney function. 9.  2 g sodium diet. 10.  Continue daily weights. 11.  Continue Rocephin  12. Anticoagulation management per pharmacy. 13.  Hepatitis a and B vaccine as outpatient.    LOS: 7 days    09/17/2019, 4:02 PM   Aliene Altes, [facility]

## 2019-09-17 NOTE — H&P (View-Only) (Signed)
Subjective: Had 1 black stool last night and 1 black stool this morning around 11:30 am. No nausea or vomiting. Mild left sided abdominal tenderness is improving. Feels swelling is improving.   Objective: Vital signs in last 24 hours: Temp:  [98.1 F (36.7 C)-98.6 F (37 C)] 98.1 F (36.7 C) (04/14 1456) Pulse Rate:  [63-91] 63 (04/14 1456) Resp:  [18-20] 18 (04/14 1456) BP: (104-126)/(67-82) 108/82 (04/14 1456) SpO2:  [95 %-100 %] 100 % (04/14 1456) Weight:  [115.7 kg] 115.7 kg (04/14 0633) Last BM Date: 09/16/19 General:   Alert and oriented, pleasant Head:  Normocephalic and atraumatic. Eyes:  No icterus, sclera clear. Conjuctiva pink.  Abdomen:  Bowel sounds present. Abdomen is distended but not tense. Abdominal wawll edema with mild improvement. Erythema of left side of his abdomen is improving. Associated mild tenderness in this area is improving. No rebound or guarding.  Msk:  Symmetrical without gross deformities. Normal posture. Extremities:  With 3+ bilateral LE edema below the knees, seems to be slightly improved since yesterday. Neurologic:  Alert and  oriented x4;  grossly normal neurologically. Psych:  Normal mood and affect.  Intake/Output from previous day: 04/13 0701 - 04/14 0700 In: 400 [P.O.:400] Out: 900 [Urine:900] Intake/Output this shift: Total I/O In: 480 [P.O.:480] Out: -   Lab Results: Recent Labs    09/15/19 0448 09/16/19 0700 09/17/19 0000  WBC 4.6 4.6 4.1  HGB 10.7* 11.1* 9.4*  HCT 34.0* 35.4* 29.3*  PLT 107* 109* 99*   BMET Recent Labs    09/15/19 0448 09/17/19 0000  NA 138 135  K 3.8 3.9  CL 101 102  CO2 30 27  GLUCOSE 88 95  BUN 15 17  CREATININE 0.76 0.68  CALCIUM 8.3* 8.4*   LFT Recent Labs    09/15/19 0448  PROT 6.0*  ALBUMIN 2.7*  AST 21  ALT 12  ALKPHOS 18  BILITOT 0.6    Assessment: 44 year old male with likely NASH cirrhosis MELD 8, admitted with decompensation to include anasarca/ascites and failing  outpatient diuretic regimen, found to have portal vein thrombosis and now started on anticoagulation with heparin and received 1 dose Eliquis morning of 09/15/19.   Anasarca/ascites: LVAP in ED on 4/7 with 8 liters removed. Negative cell count for SBP. S/p several doses of IV albumin followed by Lasix with last Albumin this morning. Also on spirolactone which was increased to 100 mg BID today. Will need to monitor potassium. Overall down 60 lbs since admission. Continue diuresis.  Anemia: Normocytic anemia with ferritin 193 likely secondary to chronic disease. Reports of intermittent black stools prior to admission with one episode last night and one this morning around 11:30 AM.  He was heme positive on 09/15/2019.  Hemoglobin has been fluctuating this admission and is down to 9.4 today from 11.1 yesterday. 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  He has been on anticoagulation with heparin due to portal vein thrombosis and may be intermittently oozing from known portal gastropathy/recent polypectomy.  Plans for colonoscopy tomorrow for complete evaluation of heme positive stool.  Portal vein thrombosis: Unclear if acute or chronic. Started on anticoagulation this admission with heparin and received Eliquis morning of 09/15/19. Plans for Eliquis at discharge but this has been put on hold for now due intermittent melena, anemia, and heme positive stool. 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 Plans for colonoscopy on 4/15 for complete evaluation of heme positive stool.  Patient may be continuing to ooze from an upper GI source in the  setting of anticoagulation. Outpatient anticoagulation may prove to be difficult if he continues to ooze from upper GI source. Further recommendations following TCS tomorrow.     Erythema of abdominal wall: Erythema noted around para site on left side of his abdomen. Associated warmth and mild tenderness. Started on Rocephin yesterday. Continue antibiotics.    Plan: 1.  Plan for colonoscopy 4/15 with Dr. Oneida Alar. The risks, benefits, and alternatives have been discussed in detail with patient. They have stated understanding and desire to proceed. We will need to hold heparin 2 hours prior to colonoscopy. 2.  Clear liquid diet today. 3.  N.p.o. at midnight. 4.  Continue to monitor H&H. 5.  Continue monitoring for overt GI bleeding. 6.  Continue Lasix twice daily. 7.  Continue Aldactone 100 mg twice daily. 8.  Monitor electrolytes and kidney function. 9.  2 g sodium diet. 10.  Continue daily weights. 11.  Continue Rocephin  12. Anticoagulation management per pharmacy. 13.  Hepatitis a and B vaccine as outpatient.    LOS: 7 days    09/17/2019, 4:02 PM   Aliene Altes, [facility]

## 2019-09-17 NOTE — Progress Notes (Signed)
Patient Demographics:    Alejandro Porter, is a 44 y.o. male, DOB - Oct 20, 1975, KDX:[id number]  Admit date - 09/10/2019   Admitting Physician Orson Eva, MD  Outpatient Primary MD for the patient is [facility]  LOS - 7   Chief Complaint  Patient presents with  . Ascites        Subjective:    Alejandro Porter today has no fevers, no emesis,  No chest pain,   - --Had at least 2 dark/black BMs in the last 16 hours -Hemoglobin down to 9.4 from 11.1 -PTT appears supratherapeutic -Colonoscopy planned for 09/18/2019 -He dad and cousin at bedside, questions answered   Assessment  & Plan :    Active Problems:   Cirrhosis of liver with ascites (HCC)   Portal vein thrombosis  Brief summary 44 y.o. 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 Child Pugh B. -s/p paracentesis on 09/10/2019 with removal of 8 L -Patient failed outpatient diuresis with oral agents, ---continue IV Lasix with IV albumin and p.o. Aldactone -GI consult appreciated -he will need vaccination against hep B and hep A as outpatient  2)Portal vein thrombosis, nonocclusive, extending into right intrahepatic portal vein, first noted at time of CT 08/16/20---  EGD showed grade 1 and grade 2 esophageal varices without stigmata of bleeding -Discussed with Dr. Delton Coombes from hematology -GI service also recommends anticoagulation ---c/n IV  heparin, plan to transition to p.o. Eliquis if no bleeding and if platelet counts are stable -Platelets were 185 on 08/25/2019,  -Platelets down to 90 K (platelet counts were already drifting down prior to initiation of IV heparin on 09/12/2019 Platelets 185 (08/25/19),  Platelets this admission- 111>>106>>>90>>101>>107 -Monitor platelets and H&H, monitor for bleeding -Risk versus benefit of full anticoagulation discussed with patient, his father and his cousin at bedside----they have decided to go ahead with full anticoagulation -PTT very elevated >>> 200 may be because he got 1 dose of Eliquis 10 mg on 09/15/2019 in a.m. -Hold heparin and adjust IV heparin infusion  3)H/o COVID 19 infection--diagnosed 08/25/2019, repeat Covid test negative on 09/10/2019 -Asymptomatic,   4)HTN--stopped metoprolol, use propanolol due to moderate portal hypertension  5)Esophageal dysphagia secondary Schatzki's ring.  Patient underwent esophageal dilation on 09/12/2019--- tolerating oral intake well  6)Acute Gi Bleed- -Patient with heme positive stool in setting of esophageal varices and need for anticoagulation for portal vein thrombosis -Discussed with GI service anticipate colonoscopy-on 09/18/2019 after Eliquis washout ---Had at least 2 dark/black BMs in the last 16 hours -Hemoglobin down to 9.4 from 11.1 -PTT appears supratherapeutic  -- 7) abdominal discomfort post paracentesis--- irritation around left anterior abdominal wall noted ----IV Rocephin for SBP prophylaxis should cover for any infection in this area as well  Disposition/Need for in-Hospital Stay- patient unable to be discharged at this time due to ---decompensated liver cirrhosis requiring aggressive iv diuresis  and portal vein thrombosis requiring IV heparin -Patient From: Home D/C Place: Home Barriers: Not Clinically Stable- --Patient failed outpatient diuresis with oral agents, continue IV Lasix and IV heparin -Patient with heme positive and  dark stools, needs colonoscopy prior to decision on whether full anticoagulation can be continued upon discharge -Plan is for colonoscopy on 09/18/2019 continue IV heparin for now  Code Status : Full code  Family Communication:   (patient is alert, awake and coherent) Cousin and father at bedside  Consults  :  GI/phone consult with Dr. Delton Coombes from hematology  Procedures:- 1) paracentesis on 09/10/2019 with removal of 8 L 2)EGD on 09/12/2019- Small hiatal hernia. Mild erosive reflux esophagitis. Schatzki's ring - status post dilation   - Small grade 1/grade 2 esophageal varices without  bleeding stigmata - Portal hypertensive gastropathy 3) colonoscopy planned for 09/18/2019  DVT Prophylaxis  : -IV heparin  Lab Results  Component Value Date   PLT 99 (L) 09/17/2019   Inpatient Medications  Scheduled Meds: . furosemide  40 mg Intravenous BID  . gabapentin  300 mg Oral QHS  . lidocaine (PF)  5 mL Intradermal Once  . polyethylene glycol  17 g Oral Daily  . polyethylene glycol-electrolytes  4,000 mL Oral Once  . propranolol  20 mg Oral TID  . spironolactone  100 mg Oral BID WC   Continuous Infusions: . cefTRIAXone (ROCEPHIN)  IV 2 g (09/17/19 1405)  . heparin 2,000 Units/hr (09/17/19 1352)   PRN Meds:.  Anti-infectives (From admission, onward)   Start     Dose/Rate Route Frequency Ordered Stop   09/16/19 1300  cefTRIAXone (ROCEPHIN) 2 g in sodium chloride 0.9 % 100 mL IVPB     2 g 200 mL/hr over 30 Minutes Intravenous Every 24 hours 09/16/19 1216 09/21/19 1259       Objective:   Vitals:   09/16/19 2251 09/17/19 0633 09/17/19 1037 09/17/19 1456  BP: 113/71 107/71 126/76 108/82  Pulse: 91 70 80 63  Resp: 20 20  18   Temp: 98.3 F (36.8 C) 98.6 F (37 C)  98.1 F (36.7 C)  TempSrc: Oral Oral  Oral  SpO2: 95% 97%  100%  Weight:  115.7 kg    Height:        Wt Readings from Last 3 Encounters:  09/17/19 115.7 kg  09/10/19 (!) 142.9 kg  08/25/19 133.8 kg      Intake/Output Summary (Last 24 hours) at 09/17/2019 1639 Last data filed at 09/17/2019 1300 Gross per 24 hour  Intake 480 ml  Output --  Net 480 ml    Physical Exam  Gen:- Awake Alert, chronically ill-appearing  HEENT:- [place].AT,   Neck-Supple Neck,No JVD,.  Lungs-  CTAB , fair symmetrical air movement CV- S1, S2 normal, regular  Abd-diminished bowel sounds, no significant tenderness, ascites with fluid thrill noted, Lt anterior abd wall erythema, tenderness and induration at prior paracentesis site improving Extremity/Skin:-2+ pitting edema, pedal pulses present  Psych-affect is appropriate, oriented x3 Neuro-generalized weakness, no new focal deficits, no tremors   Data Review:   Micro Results Recent Results (from the past 240 hour(s))  Culture, body fluid-bottle     Status: None   Collection Time: 09/10/19 10:27 AM   Specimen: Ascitic  Result Value Ref Range Status   Specimen Description ASCITIC  Final   Special Requests BOTTLES DRAWN AEROBIC AND ANAEROBIC 10CC  Final   Culture   Final    NO GROWTH 5 DAYS Performed at [facility],  [address] [place], [place] [address]    Report Status 09/15/2019 FINAL  Final  Gram stain     Status: None   Collection Time: 09/10/19 10:27 AM   Specimen: Ascitic  Result Value Ref Range Status   Specimen Description ASCITIC  Final   Special Requests NONE  Final   Gram Stain   Final    NO ORGANISMS SEEN WBC PRESENT, PREDOMINANTLY MONONUCLEAR CYTOSPIN SMEAR Performed at [facility], [address] [place], [place] [address]    Report Status 09/10/2019 FINAL  Final  SARS CORONAVIRUS 2 (TAT 6-24 HRS) Nasopharyngeal Nasopharyngeal Swab     Status: None   Collection Time: 09/10/19 12:09 PM   Specimen: Nasopharyngeal Swab  Result Value Ref Range Status   SARS Coronavirus 2 NEGATIVE NEGATIVE Final    Comment: (NOTE) SARS-CoV-2 target nucleic acids are NOT DETECTED. The SARS-CoV-2 RNA is generally detectable in upper and  lower respiratory specimens during the acute phase of infection. Negative results do not preclude SARS-CoV-2 infection, do not rule out co-infections with other pathogens, and should not be used as the sole basis for treatment or other patient management decisions. Negative results must be combined with clinical observations, patient history, and epidemiological information. The expected result is Negative. Fact Sheet for Patients: SugarRoll.be Fact Sheet for Healthcare Providers: https://www.woods-mathews.com/ This test is not yet approved or cleared by the [place] FDA and  has been authorized for detection and/or diagnosis of SARS-CoV-2 by FDA under an Emergency Use Authorization (EUA). This EUA will remain  in effect (meaning this test can be used) for the duration of the COVID-19 declaration under Section 56 4(b)(1) of the Act, 21 U.S.C. section 360bbb-3(b)(1), unless the authorization is terminated or revoked sooner. Performed at [facility], [place] [address] [place], [place] [address]     Radiology Reports DG Chest [place] 1 View  Result Date: 09/10/2019 CLINICAL DATA:  Shortness of breath. EXAM: PORTABLE CHEST 1 VIEW COMPARISON:  August 25, 2019. FINDINGS: Stable cardiomediastinal silhouette. No pneumothorax is noted. Increased left basilar atelectasis or infiltrate is noted. Minimal right basilar subsegmental atelectasis or infiltrate is noted. Bony thorax is unremarkable. IMPRESSION: Increased bibasilar subsegmental atelectasis or infiltrates, left greater than right. Electronically Signed   By: Marijo Conception M.D.   On: 09/10/2019 10:14   DG Chest Portable 1 View  Result Date: 08/25/2019 CLINICAL DATA:  Shortness of breath. Abdominal swelling and leg swelling. EXAM: PORTABLE CHEST 1 VIEW COMPARISON:  08/17/2019 FINDINGS: The heart size is within normal limits. Slight pulmonary vascular prominence. Minimal atelectasis at the left  lung base. No pulmonary edema or visible pleural effusions. No bone abnormality. IMPRESSION: Slight pulmonary vascular prominence. Electronically Signed   By: Lorriane Shire M.D.   On: 08/25/2019 13:12   ECHOCARDIOGRAM COMPLETE  Result Date: 09/10/2019    ECHOCARDIOGRAM REPORT   Patient Name:   JOSEANDRES MAZER Date of Exam: 09/10/2019 Medical Rec #:  [id number]       Height:       69.0 in Accession #:    [id number]      Weight:       315.0 lb Date of Birth:  07/07/1975        BSA:          2.506 m Patient Age:    33 years        BP:           134/94 mmHg Patient Gender: M  HR:           92 bpm. Exam Location:  [facility] Procedure: 2D Echo Indications:    Dyspnea 786.09 / R06.00  History:        Patient has no prior history of Echocardiogram examinations.                 Risk Factors:Non-Smoker and Hypertension. Edema, Portal vein                 thrombosis, History of COVID 19.  Sonographer:    Leavy Cella RDCS (AE) Referring Phys: [id number] DAVID TAT IMPRESSIONS  1. Left ventricular ejection fraction, by estimation, is 60 to 65%. The left ventricle has normal function. The left ventricle has no regional wall motion abnormalities. Left ventricular diastolic parameters are indeterminate.  2. Right ventricular systolic function is normal. The right ventricular size is normal.  3. The mitral valve was not well visualized. No evidence of mitral valve regurgitation. No evidence of mitral stenosis.  4. The aortic valve was not well visualized. Aortic valve regurgitation is not visualized. No aortic stenosis is present. FINDINGS  Left Ventricle: Left ventricular ejection fraction, by estimation, is 60 to 65%. The left ventricle has normal function. The left ventricle has no regional wall motion abnormalities. The left ventricular internal cavity size was normal in size. There is  no left ventricular hypertrophy. Left ventricular diastolic parameters are indeterminate. Right Ventricle: The right ventricular size  is normal. No increase in right ventricular wall thickness. Right ventricular systolic function is normal. Left Atrium: Left atrial size was normal in size. Right Atrium: Right atrial size was normal in size. Pericardium: There is no evidence of pericardial effusion. Mitral Valve: The mitral valve was not well visualized. No evidence of mitral valve regurgitation. No evidence of mitral valve stenosis. Tricuspid Valve: The tricuspid valve is normal in structure. Tricuspid valve regurgitation is not demonstrated. No evidence of tricuspid stenosis. Aortic Valve: The aortic valve was not well visualized. Aortic valve regurgitation is not visualized. No aortic stenosis is present. Aortic valve mean gradient measures 5.2 mmHg. Aortic valve peak gradient measures 9.3 mmHg. Aortic valve area, by VTI measures 2.81 cm. Pulmonic Valve: The pulmonic valve was not well visualized. Pulmonic valve regurgitation is not visualized. No evidence of pulmonic stenosis. Aorta: The aortic root was not well visualized. Pulmonary Artery: Indeterminate PASP, inadequate TR jet. Venous: The inferior vena cava was not well visualized. IAS/Shunts: The interatrial septum was not well visualized. Additional Comments: Mild ascites is present.  LEFT VENTRICLE PLAX 2D LVIDd:         4.96 cm  Diastology LVIDs:         3.02 cm  LV e' lateral:   14.80 cm/s LV PW:         0.90 cm  LV E/e' lateral: 3.8 LV IVS:        0.86 cm  LV e' medial:    8.16 cm/s LVOT diam:     2.30 cm  LV E/e' medial:  6.9 LV SV:         86 LV SV Index:   34 LVOT Area:     4.15 cm  RIGHT VENTRICLE RV S prime:     12.60 cm/s TAPSE (M-mode): 2.8 cm LEFT ATRIUM             Index LA diam:        4.30 cm 1.72 cm/m LA Vol (A2C):   49.2 ml 19.63 ml/m LA  Vol (A4C):   38.5 ml 15.36 ml/m LA Biplane Vol: 47.3 ml 18.87 ml/m  AORTIC VALVE AV Area (Vmax):    2.62 cm AV Area (Vmean):   2.50 cm AV Area (VTI):     2.81 cm AV Vmax:           152.23 cm/s AV Vmean:          107.685 cm/s AV  VTI:            0.307 m AV Peak Grad:      9.3 mmHg AV Mean Grad:      5.2 mmHg LVOT Vmax:         95.98 cm/s LVOT Vmean:        64.882 cm/s LVOT VTI:          0.208 m LVOT/AV VTI ratio: 0.68  AORTA Ao Root diam: 3.40 cm MITRAL VALVE MV Area (PHT): 3.81 cm    SHUNTS MV Decel Time: 199 msec    Systemic VTI:  0.21 m MV E velocity: 56.10 cm/s  Systemic Diam: 2.30 cm MV A velocity: 54.80 cm/s MV E/A ratio:  1.02 Carlyle Dolly MD Electronically signed by Carlyle Dolly MD Signature Date/Time: 09/10/2019/4:56:54 PM    Final    US LIVER DOPPLER  Result Date: 09/11/2019 CLINICAL DATA:  Cirrhosis, ascites, nonocclusive portal thrombus by CT EXAM: DUPLEX ULTRASOUND OF LIVER TECHNIQUE: Color and duplex Doppler ultrasound was performed to evaluate the hepatic in-flow and out-flow vessels. COMPARISON:  08/17/2019 CT FINDINGS: Liver: Increased echogenicity with surface nodularity compatible with hepatic cirrhosis. No focal lesion, mass or intrahepatic biliary ductal dilatation. Main Portal Vein size: 2.7 cm Portal Vein Velocities Main Prox:  35 cm/sec Main Mid: 39 cm/sec Main Dist:  33 cm/sec Right: 26 cm/sec Left: 23 cm/sec Hepatic Vein Velocities Right:  40 cm/sec Middle:  27 cm/sec Left:  46 cm/sec IVC: Present and patent with normal respiratory phasicity. Hepatic Artery Velocity:  37 cm/sec Splenic Vein Velocity:  18 cm/sec Spleen: 18 cm x 5.5 cm x 22 cm with a total volume of 1163 cm^3 (411 cm^3 is upper limit normal) Portal Vein Occlusion/Thrombus: Nonocclusive hypoechoic thrombus along the main portal vein wall posteriorly extending into the right portal vein. This correlates with the CT. Splenic Vein Occlusion/Thrombus: No Ascites: Moderate ascites present. Varices: None IMPRESSION: Nonocclusive portal vein thrombus within the main portal vein extending into the right portal vein. Hepatopetal flow is maintained. Hepatic cirrhosis and ascites.  Associated splenomegaly. Electronically Signed   By: Jerilynn Mages.  Shick M.D.   On:  09/11/2019 16:02     CBC Recent Labs  Lab 09/13/19 0551 09/14/19 0512 09/15/19 0448 09/16/19 0700 09/17/19 0000  WBC 4.4 4.3 4.6 4.6 4.1  HGB 10.5* 11.3* 10.7* 11.1* 9.4*  HCT 34.1* 36.3* 34.0* 35.4* 29.3*  PLT 90* 101* 107* 109* 99*  MCV 94.7 93.3 93.7 92.9 92.7  MCH 29.2 29.0 29.5 29.1 29.7  MCHC 30.8 31.1 31.5 31.4 32.1  RDW 13.8 14.2 14.1 14.3 14.2    Chemistries  Recent Labs  Lab 09/11/19 0535 09/13/19 0551 09/14/19 0512 09/15/19 0448 09/17/19 0000  NA 139 138 139 138 135  K 4.0 4.4 3.7 3.8 3.9  CL 104 101 101 101 102  CO2 30 31 30 30 27   GLUCOSE 88 78 85 88 95  BUN 13 12 11 15 17   CREATININE 0.72 0.70 0.72 0.76 0.68  CALCIUM 8.3* 8.5* 8.6* 8.3* 8.4*  AST 23 18  --  21  --  ALT 16 12  --  12  --   ALKPHOS 70 53  --  54  --   BILITOT 1.3* 1.1  --  0.6  --    ------------------------------------------------------------------------------------------------------------------ No results for input(s): CHOL, HDL, LDLCALC, TRIG, CHOLHDL, LDLDIRECT in the last 72 hours.  No results found for: HGBA1C ------------------------------------------------------------------------------------------------------------------ No results for input(s): TSH, T4TOTAL, T3FREE, THYROIDAB in the last 72 hours.  Invalid input(s): FREET3 ------------------------------------------------------------------------------------------------------------------ No results for input(s): VITAMINB12, FOLATE, FERRITIN, TIBC, IRON, RETICCTPCT in the last 72 hours.  Coagulation profile No results for input(s): INR, PROTIME in the last 168 hours.  No results for input(s): DDIMER in the last 72 hours.  Cardiac Enzymes No results for input(s): CKMB, TROPONINI, MYOGLOBIN in the last 168 hours.  Invalid input(s): CK ------------------------------------------------------------------------------------------------------------------    Component Value Date/Time   BNP 44.0 08/25/2019 1329    Ladislaus Repsher M.D on 09/17/2019 at 4:39 PM  Go to [email or web address] - for contact info  [facility] - Office  [phone]

## 2019-09-18 ENCOUNTER — Encounter (HOSPITAL_COMMUNITY): Payer: Self-pay | Admitting: Internal Medicine

## 2019-09-18 ENCOUNTER — Encounter (HOSPITAL_COMMUNITY)
Admission: EM | Disposition: A | Discharge status: Home / Self Care | Payer: Self-pay | Source: Ambulatory Visit | Attending: Family Medicine

## 2019-09-18 DIAGNOSIS — K621 Rectal polyp: Secondary | ICD-10-CM

## 2019-09-18 DIAGNOSIS — K635 Polyp of colon: Secondary | ICD-10-CM

## 2019-09-18 HISTORY — PX: POLYPECTOMY: SHX5525

## 2019-09-18 HISTORY — PX: COLONOSCOPY: SHX5424

## 2019-09-18 LAB — COMPREHENSIVE METABOLIC PANEL
ALT: 16 U/L (ref 0–44)
AST: 22 U/L (ref 15–41)
Albumin: 3.1 g/dL — ABNORMAL LOW (ref 3.5–5.0)
Alkaline Phosphatase: 55 U/L (ref 38–126)
Anion gap: 11 (ref 5–15)
BUN: 15 mg/dL (ref 6–20)
CO2: 26 mmol/L (ref 22–32)
Calcium: 9.1 mg/dL (ref 8.9–10.3)
Chloride: 100 mmol/L (ref 98–111)
Creatinine, Ser: 0.72 mg/dL (ref 0.61–1.24)
GFR calc Af Amer: >60 mL/min (ref >60)
GFR calc non Af Amer: >60 mL/min (ref >60)
Glucose, Bld: 88 mg/dL (ref 70–99)
Potassium: 4 mmol/L (ref 3.5–5.1)
Sodium: 137 mmol/L (ref 135–145)
Total Bilirubin: 0.6 mg/dL (ref 0.3–1.2)
Total Protein: 6.7 g/dL (ref 6.5–8.1)

## 2019-09-18 LAB — CBC
HCT: 29.2 % — ABNORMAL LOW (ref 39.0–52.0)
Hemoglobin: 9.4 g/dL — ABNORMAL LOW (ref 13.0–17.0)
MCH: 29.9 pg (ref 26.0–34.0)
MCHC: 32.2 g/dL (ref 30.0–36.0)
MCV: 93 fL (ref 80.0–100.0)
Platelets: 95 10*3/uL — ABNORMAL LOW (ref 150–400)
RBC: 3.14 MIL/uL — ABNORMAL LOW (ref 4.22–5.81)
RDW: 14.7 % (ref 11.5–15.5)
WBC: 3.5 10*3/uL — ABNORMAL LOW (ref 4.0–10.5)
nRBC: 0 % (ref 0.0–0.2)

## 2019-09-18 LAB — HEPARIN LEVEL (UNFRACTIONATED): Heparin Unfractionated: 0.27 IU/mL — ABNORMAL LOW (ref 0.30–0.70)

## 2019-09-18 SURGERY — COLONOSCOPY
Anesthesia: Moderate Sedation

## 2019-09-18 MED ORDER — MIDAZOLAM HCL 5 MG/5ML IJ SOLN
INTRAMUSCULAR | Status: DC | PRN
  Administered 2019-09-18: 1 mg via INTRAVENOUS
  Administered 2019-09-18: 2 mg via INTRAVENOUS
  Administered 2019-09-18: 1 mg via INTRAVENOUS
  Administered 2019-09-18: 2 mg via INTRAVENOUS

## 2019-09-18 MED ORDER — SODIUM CHLORIDE 0.9 % IV SOLN: INTRAVENOUS | Status: DC

## 2019-09-18 MED ORDER — MEPERIDINE HCL 100 MG/ML IJ SOLN
INTRAMUSCULAR | Status: AC
  Filled 2019-09-18: qty 2

## 2019-09-18 MED ORDER — HEPARIN (PORCINE) 25000 UT/250ML-% IV SOLN
2000.0000 [IU]/h | INTRAVENOUS | Status: DC
  Administered 2019-09-18: 2000 [IU]/h via INTRAVENOUS
  Filled 2019-09-18: qty 250

## 2019-09-18 MED ORDER — SODIUM CHLORIDE (PF) 0.9 % IJ SOLN
PREFILLED_SYRINGE | INTRAMUSCULAR | Status: DC | PRN
  Administered 2019-09-18: 1 mL
  Administered 2019-09-18: 2 mL

## 2019-09-18 MED ORDER — STERILE WATER FOR IRRIGATION IR SOLN
Status: DC | PRN
  Administered 2019-09-18: 1.5 mL

## 2019-09-18 MED ORDER — EPINEPHRINE 1 MG/10ML IJ SOSY
PREFILLED_SYRINGE | INTRAMUSCULAR | Status: AC
  Filled 2019-09-18: qty 10

## 2019-09-18 MED ORDER — MIDAZOLAM HCL 5 MG/5ML IJ SOLN
INTRAMUSCULAR | Status: AC
  Filled 2019-09-18: qty 10

## 2019-09-18 MED ORDER — MEPERIDINE HCL 100 MG/ML IJ SOLN
INTRAMUSCULAR | Status: DC | PRN
  Administered 2019-09-18 (×2): 25 mg via INTRAVENOUS

## 2019-09-18 NOTE — Progress Notes (Signed)
Most recent stools were basically clear fluid

## 2019-09-18 NOTE — Op Note (Addendum)
[facility] Patient Name: Alejandro Porter Procedure Date: 09/18/2019 1:18 PM MRN: [id number] Date of Birth: 1975/08/10 Attending MD: Barney Drain MD, MD CSN: [id number] Age: 44 Admit Type: Outpatient Procedure:                Colonoscopy with COLD FORCEPS/SNARE AND SNARE                            POLYPECTOMY Indications:              Anemia Providers:                Barney Drain MD, MD, Otis Peak B. Sharon Seller, RN, Aram Candela Referring MD:             Soyla Dryer PA-C, PA Medicines:                Meperidine 50 mg IV, Midazolam 6 mg IV Complications:            No immediate complications. Estimated Blood Loss:     Estimated blood loss was minimal. Procedure:                Pre-Anesthesia Assessment:                           - Prior to the procedure, a History and Physical                            was performed, and patient medications and                            allergies were reviewed. The patient's tolerance of                            previous anesthesia was also reviewed. The risks                            and benefits of the procedure and the sedation                            options and risks were discussed with the patient.                            All questions were answered, and informed consent                            was obtained. Prior Anticoagulants: The patient has                            taken heparin, last dose was day of procedure. ASA                            Grade Assessment: III - A patient with severe  systemic disease. After reviewing the risks and                            benefits, the patient was deemed in satisfactory                            condition to undergo the procedure. After obtaining                            informed consent, the colonoscope was passed under                            direct vision. Throughout the procedure, the   patient's blood pressure, pulse, and oxygen                            saturations were monitored continuously. The                            CF-[id number] ([id number]) scope was introduced through                            the anus and advanced to the the cecum, identified                            by appendiceal orifice and ileocecal valve. The                            colonoscopy was technically difficult and complex                            due to a tortuous colon. Successful completion of                            the procedure was aided by straightening and                            shortening the scope to obtain bowel loop reduction                            and COLOWRAP. The patient tolerated the procedure                            well. The quality of the bowel preparation was                            good. The ileocecal valve, appendiceal orifice, and                            rectum were photographed. Scope In: 1:52:27 PM Scope Out: 2:36:17 PM Scope Withdrawal Time: 0 hours 38 minutes 13 seconds  Total Procedure Duration: 0 hours 43 minutes 50 seconds  Findings:      A 2 mm polyp was found in the cecum. The polyp  was sessile. The polyp       was removed with a cold biopsy forceps. Resection and retrieval were       complete.      A 3 mm polyp was found in the cecum. The polyp was sessile. The polyp       was removed with a cold snare. Resection and retrieval were complete.      Ten sessile polyps were found in the rectum, sigmoid colon, descending       colon, transverse colon, hepatic flexure, ascending colon and cecum. The       polyps were 4 to 10 mm in size. These polyps were removed with a hot       snare. Resection and retrieval were complete. To prevent bleeding after       the polypectomy, one hemostatic clip IN THE CECUM was successfully       placed (MR conditional). There was no bleeding at the end of the       procedure.      Multiple small and  large-mouthed diverticula were found in the       recto-sigmoid colon, sigmoid colon and descending colon.      External and internal hemorrhoids were found.      The recto-sigmoid colon, sigmoid colon, descending colon and splenic       flexure were moderately tortuous. Impression:               - One 2 mm polyp in the cecum, removed with a cold                            biopsy forceps. Resected and retrieved.                           - One 3 mm polyp in the cecum, removed with a cold                            snare. Resected and retrieved.                           - Ten 4 to 10 mm polyps in the rectum, in the                            sigmoid colon, in the descending colon, in the                            transverse colon, at the hepatic flexure, in the                            ascending colon and in the cecum, removed with a                            hot snare. Resected and retrieved.                           - Diverticulosis in the recto-sigmoid colon, in the  sigmoid colon and in the descending colon.                           - External and internal hemorrhoids.                           - MODERATELY TORTUOUS LEFT colon.                           - 12 COLONPOLYSP REMOVED Moderate Sedation:      Moderate (conscious) sedation was administered by the endoscopy nurse       and supervised by the endoscopist. The following parameters were       monitored: oxygen saturation, heart rate, blood pressure, and response       to care. Total physician intraservice time was 58 minutes. Recommendation:           - HAS [employer] 360 CLIP IN CECUM. CONSIDER                            BENEFITS V. RISKS OF MRI WITHIN THE NEXT 30 DAYS.                           - Low sodium diet.                           - Return patient to hospital ward for ongoing care.                           - Continue present medications. RE-START HEPARIN                             WITHOUT A BOLUS @2100 .PK TO START ELIQUIS APR 17.                            STOP BETAL BLOCKER IN PT WITH DIFFICULT TO CONTROL                            ASCITES/ANASARCA.                           - Await pathology results.                           - Repeat colonoscopy date to be determined after                            pending pathology results are reviewed for                            surveillance. Procedure Code(s):        --- Professional ---                           [phone], Colonoscopy, flexible; with removal of  tumor(s), polyp(s), or other lesion(s) by snare                            technique                           45380, 59, Colonoscopy, flexible; with biopsy,                            single or multiple                           99153, Moderate sedation; each additional 15                            minutes intraservice time                           99153, Moderate sedation; each additional 15                            minutes intraservice time                           99153, Moderate sedation; each additional 15                            minutes intraservice time                           G0500, Moderate sedation services provided by the                            same physician or other qualified health care                            professional performing a gastrointestinal                            endoscopic service that sedation supports,                            requiring the presence of an independent trained                            observer to assist in the monitoring of the                            patient's level of consciousness and physiological                            status; initial 15 minutes of intra-service time;                            patient age 85 years or older (additional time 21  be reported with [phone], as appropriate) Diagnosis Code(s):        --- Professional ---                            K63.5, Polyp of colon                           K62.1, Rectal polyp                           K64.8, Other hemorrhoids                           D64.9, Anemia, unspecified                           K57.30, Diverticulosis of large intestine without                            perforation or abscess without bleeding                           Q43.8, Other specified congenital malformations of                            intestine CPT copyright 2019 [employer]. All rights reserved. The codes documented in this report are preliminary and upon coder review may  be revised to meet current compliance requirements. Barney Drain, MD Barney Drain MD, MD 09/18/2019 2:56:07 PM This report has been signed electronically. Number of Addenda: 0

## 2019-09-18 NOTE — Interval H&P Note (Signed)
History and Physical Interval Note: DISCUSSED PROCEDURE, BENEFITS, & RISKS: < 1% chance of medication reaction, bleeding, perforation, ASPIRATION, or rupture of spleen/liver requiring surgery to fix it and missed polyps < 1 cm 10-20% of the time.   09/18/2019 1:36 PM  Alejandro Porter  has presented today for surgery, with the diagnosis of anemia; heme positive stool; melena.  The various methods of treatment have been discussed with the patient and family. After consideration of risks, benefits and other options for treatment, the patient has consented to  Procedure(s): COLONOSCOPY (N/A) as a surgical intervention.  The patient's history has been reviewed, patient examined, no change in status, stable for surgery.  I have reviewed the patient's chart and labs.  Questions were answered to the patient's satisfaction.     [employer]

## 2019-09-18 NOTE — Progress Notes (Signed)
Tap enemas given x2. Stools clear. Patient voices no complaints, just ready for food and drink.

## 2019-09-18 NOTE — Progress Notes (Signed)
ANTICOAGULATION CONSULT NOTE  Pharmacy Consult for Apixaban>>>Heparin Indication: portal vein thrombosis  No Known Allergies  Patient Measurements: Height: 5' 9"  (175.3 cm) Weight: 115.7 kg (255 lb) IBW/kg (Calculated) : 70.7 Heparin Dosing Weight:  HEPARIN DW (KG): 104.7  Vital Signs: Temp: 98.2 F (36.8 C) (04/15 1232) Temp Source: Oral (04/15 1232) BP: 113/78 (04/15 1445) Pulse Rate: 79 (04/15 1445)  Labs: Recent Labs    09/16/19 0700 09/16/19 0700 09/16/19 1336 09/16/19 1336 09/17/19 0000 09/17/19 0710 09/17/19 0848 09/18/19 0536  HGB 11.1*   < >  --   --  9.4*  --   --  9.4*  HCT 35.4*  --   --   --  29.3*  --   --  29.2*  PLT 109*  --   --   --  99*  --   --  95*  APTT >200*   < > >200*  --  163* 164*  --   --   HEPARINUNFRC 1.16*   < > 0.92*   < > 0.49  --  0.51 0.27*  CREATININE  --   --   --   --  0.68  --   --  0.72   < > = values in this interval not displayed.    Estimated Creatinine Clearance: 147.8 mL/min (by C-G formula based on SCr of 0.72 mg/dL).  Medical History: Past Medical History:  Diagnosis Date  . Hypertension   . Liver cirrhosis ([employer])   . Neuropathy     Assessment: Pharmacy consulted to dose heparin for this 44 yo male with portal vein thrombosis.  Upper endoscopy showed no acute bleeding at this time. Patient has low baseline platelets of 106K, but patient has a history of hepatic cirrhosis.  He wasn't on anti-coagulation prior to admission.  Patient received 1 dose of apixaban 10 mg 4/12 1121 before switched back to heparin infusion  Unclear why heparin level and APTT have little correlation with one another-at this point would use heparin level to dose   MD would like to be cautious with dosing and keep heparin levels between 0.3-0.5 since high bleed risk  09-18-19 0800 update: Heparin level : 0.27 IU/mL--heparin stopped at 0800 for colonoscopy CBC: H/H: 9.4/29.2  Plates 95K   Goal of Therapy: .  HL 0.3-0.5 (MD would like lower  end  D/t bleeding risk) Monitor platelets by anticoagulation protocol: Yes   Plan:  Re-start heparin infusion at 2100 with rate at 2000 units/hr No re-start bolus d/t high bleed risk Check HL in ~6-8 hrs Daily heparin level and CBC Monitor for s/s of bleeding. F/u transition to oral anti-coagulation .  Margot Ables, PharmD Clinical Pharmacist 09/18/2019 5:01 PM

## 2019-09-18 NOTE — Progress Notes (Signed)
Patient Demographics:    Alejandro Porter, is a 44 y.o. male, DOB - 10/27/1975, HOO:[id number]  Admit date - 09/10/2019   Admitting Physician Orson Eva, MD  Outpatient Primary MD for the patient is [facility], [facility]  LOS - 8   Chief Complaint  Patient presents with  . Ascites        Subjective:    Alejandro Porter today has no fevers, no emesis,  No chest pain,   - -Tolerated colon prep well   Assessment  & Plan :    Active Problems:   Cirrhosis of liver with ascites (HCC)   Portal vein thrombosis   Colorectal polyps   Cecal polyp  Brief summary 44 y.o. 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 Child Pugh B. -s/p paracentesis on 09/10/2019 with removal of 8 L -Patient failed outpatient diuresis with oral agents, ---continue IV Lasix with IV albumin and p.o. Aldactone -GI consult appreciated -he will need vaccination against hep B and hep A as outpatient  2)Portal vein thrombosis, nonocclusive, extending into right intrahepatic portal vein, first noted at time of CT 08/16/20---  EGD showed grade 1 and grade 2 esophageal varices without stigmata of bleeding -Discussed with Dr. Delton Coombes from hematology -GI service also  recommends anticoagulation ---c/n IV heparin, plan to transition to p.o. Eliquis if no bleeding and if platelet counts are stable -Platelets were 185 on 08/25/2019,  -Platelets down to 90 K (platelet counts were already drifting down prior to initiation of IV heparin on 09/12/2019 Platelets 185 (08/25/19),  Platelets this admission- 111>>106>>>90>>101>>107>>95 -Monitor platelets and H&H, monitor for bleeding -Risk versus benefit of full anticoagulation discussed with patient, his father and his cousin at bedside----they have decided to go ahead with full anticoagulation -Okay to transition from IV heparin to Eliquis when okay with GI service  3)H/o COVID 19 infection--diagnosed 08/25/2019, repeat Covid test negative on 09/10/2019 -Asymptomatic,   4)HTN--stopped metoprolol, use propanolol due to moderate portal hypertension  5)Esophageal dysphagia secondary Schatzki's ring.  Patient underwent esophageal dilation on 09/12/2019--- tolerating oral intake well  6)Acute Gi Bleed- -Patient with heme positive stool in setting of esophageal varices and need for anticoagulation for portal vein thrombosis -Discussed with GI service anticipate colonoscopy-on 09/18/2019 after Eliquis washout ---Had at least 2 dark/black BMs in the last 16 hours -Hemoglobin down to 9.4 from 11.1 -PTT appears supratherapeutic -Colonoscopy report from 09/18/2019 as noted above and below with polyps, internal and internal hemorrhoids and diverticulosis  -- 7) abdominal discomfort post paracentesis--- irritation around left anterior abdominal wall noted ----IV Rocephin for SBP prophylaxis should cover for any infection in this area as well  Disposition/Need for in-Hospital Stay- patient unable to be discharged at this time due  to ---decompensated liver cirrhosis requiring aggressive iv diuresis and portal vein thrombosis requiring IV heparin -Patient From: Home D/C Place: Home Barriers: Not Clinically Stable- --Patient failed  outpatient diuresis with oral agents, continue IV Lasix and IV heparin -Patient with heme positive and dark stools, needs colonoscopy prior to decision on whether full anticoagulation can be continued upon discharge -Plan is for colonoscopy on 09/18/2019 continue IV heparin for now  Code Status : Full code  Family Communication:   (patient is alert, awake and coherent) Cousin and father at bedside  Consults  :  GI/phone consult with Dr. Delton Coombes from hematology  Procedures:- 1) paracentesis on 09/10/2019 with removal of 8 L 2)EGD on 09/12/2019- Small hiatal hernia. Mild erosive reflux esophagitis. Schatzki's ring - status post dilation   - Small grade 1/grade 2 esophageal varices without  bleeding stigmata - Portal hypertensive gastropathy 3)--Colonoscopy on 09/18/2019 with internal and external hemorrhoids, diverticulosis, at least 12 polyps removed from the cecum, transverse colon, descending colon and sigmoid and rectum  DVT Prophylaxis  : -IV heparin  Lab Results  Component Value Date   PLT 95 (L) 09/18/2019   Inpatient Medications  Scheduled Meds: . EPINEPHrine      . furosemide  40 mg Intravenous BID  . gabapentin  300 mg Oral QHS  . lidocaine (PF)  5 mL Intradermal Once  . meperidine      . midazolam      . polyethylene glycol  17 g Oral Daily  . spironolactone  100 mg Oral BID WC   Continuous Infusions: . cefTRIAXone (ROCEPHIN)  IV 2 g (09/17/19 1405)  . heparin     PRN Meds:.  Anti-infectives (From admission, onward)   Start     Dose/Rate Route Frequency Ordered Stop   09/16/19 1300  cefTRIAXone (ROCEPHIN) 2 g in sodium chloride 0.9 % 100 mL IVPB     2 g 200 mL/hr over 30 Minutes Intravenous Every 24 hours 09/16/19 1216 09/21/19 1259       Objective:   Vitals:   09/18/19 1435 09/18/19 1440 09/18/19 1445 09/18/19 1600  BP: 126/90 124/84 113/78 121/80  Pulse: 76 82 79 91  Resp: (!) 24 (!) 26 (!) 26 20  Temp:    97.8 F (36.6 C)  TempSrc:    Oral  SpO2:  94% 94% 96% 98%  Weight:      Height:        Wt Readings from Last 3 Encounters:  09/17/19 115.7 kg  09/10/19 (!) 142.9 kg  08/25/19 133.8 kg     Intake/Output Summary (Last 24 hours) at 09/18/2019 1808 Last data filed at 09/18/2019 0900 Gross per 24 hour  Intake 970.38 ml  Output --  Net 970.38 ml    Physical Exam  Gen:- Awake Alert, chronically ill-appearing  HEENT:- [place].AT,   Neck-Supple Neck,No JVD,.  Lungs-  CTAB , fair symmetrical air movement CV- S1, S2 normal, regular  Abd-diminished bowel sounds, no significant tenderness, ascites with fluid thrill noted, Lt anterior abd wall erythema, tenderness and induration at prior paracentesis site improving Extremity/Skin:-2+ pitting edema, pedal pulses present  Psych-affect is appropriate, oriented x3 Neuro-generalized weakness, no new focal deficits, no tremors   Data Review:   Micro Results Recent Results (from the past 240 hour(s))  Culture, body fluid-bottle     Status: None   Collection Time: 09/10/19 10:27 AM   Specimen: Ascitic  Result Value Ref Range Status   Specimen Description ASCITIC  Final   Special Requests  BOTTLES DRAWN AEROBIC AND ANAEROBIC 10CC  Final   Culture   Final    NO GROWTH 5 DAYS Performed at [facility], [address] [place], [place] [address]    Report Status 09/15/2019 FINAL  Final  Gram stain     Status: None   Collection Time: 09/10/19 10:27 AM   Specimen: Ascitic  Result Value Ref Range Status   Specimen Description ASCITIC  Final   Special Requests NONE  Final   Gram Stain   Final    NO ORGANISMS SEEN WBC PRESENT, PREDOMINANTLY MONONUCLEAR CYTOSPIN SMEAR Performed at [facility], [address], [place], [place] [address]    Report Status 09/10/2019 FINAL  Final  SARS CORONAVIRUS 2 (TAT 6-24 HRS) Nasopharyngeal Nasopharyngeal Swab     Status: None   Collection Time: 09/10/19 12:09 PM   Specimen: Nasopharyngeal Swab  Result Value Ref Range Status   SARS Coronavirus 2  NEGATIVE NEGATIVE Final    Comment: (NOTE) SARS-CoV-2 target nucleic acids are NOT DETECTED. The SARS-CoV-2 RNA is generally detectable in upper and lower respiratory specimens during the acute phase of infection. Negative results do not preclude SARS-CoV-2 infection, do not rule out co-infections with other pathogens, and should not be used as the sole basis for treatment or other patient management decisions. Negative results must be combined with clinical observations, patient history, and epidemiological information. The expected result is Negative. Fact Sheet for Patients: SugarRoll.be Fact Sheet for Healthcare Providers: https://www.woods-mathews.com/ This test is not yet approved or cleared by the [place] FDA and  has been authorized for detection and/or diagnosis of SARS-CoV-2 by FDA under an Emergency Use Authorization (EUA). This EUA will remain  in effect (meaning this test can be used) for the duration of the COVID-19 declaration under Section 56 4(b)(1) of the Act, 21 U.S.C. section 360bbb-3(b)(1), unless the authorization is terminated or revoked sooner. Performed at [facility], [place] [address], [place], [place] [address]    Radiology Reports DG Chest [place] 1 View  Result Date: 09/10/2019 CLINICAL DATA:  Shortness of breath. EXAM: PORTABLE CHEST 1 VIEW COMPARISON:  August 25, 2019. FINDINGS: Stable cardiomediastinal silhouette. No pneumothorax is noted. Increased left basilar atelectasis or infiltrate is noted. Minimal right basilar subsegmental atelectasis or infiltrate is noted. Bony thorax is unremarkable. IMPRESSION: Increased bibasilar subsegmental atelectasis or infiltrates, left greater than right. Electronically Signed   By: Marijo Conception M.D.   On: 09/10/2019 10:14   DG Chest Portable 1 View  Result Date: 08/25/2019 CLINICAL DATA:  Shortness of breath. Abdominal swelling and leg swelling. EXAM: PORTABLE CHEST  1 VIEW COMPARISON:  08/17/2019 FINDINGS: The heart size is within normal limits. Slight pulmonary vascular prominence. Minimal atelectasis at the left lung base. No pulmonary edema or visible pleural effusions. No bone abnormality. IMPRESSION: Slight pulmonary vascular prominence. Electronically Signed   By: Lorriane Shire M.D.   On: 08/25/2019 13:12   ECHOCARDIOGRAM COMPLETE  Result Date: 09/10/2019    ECHOCARDIOGRAM REPORT   Patient Name:   VERDIS KOVAL Date of Exam: 09/10/2019 Medical Rec #:  [id number]       Height:       69.0 in Accession #:    [id number]      Weight:       315.0 lb Date of Birth:  Jun 02, 1976        BSA:          2.506 m Patient Age:    36 years  BP:           134/94 mmHg Patient Gender: M               HR:           92 bpm. Exam Location:  [facility] Procedure: 2D Echo Indications:    Dyspnea 786.09 / R06.00  History:        Patient has no prior history of Echocardiogram examinations.                 Risk Factors:Non-Smoker and Hypertension. Edema, Portal vein                 thrombosis, History of COVID 19.  Sonographer:    Leavy Cella RDCS (AE) Referring Phys: [phone] DAVID TAT IMPRESSIONS  1. Left ventricular ejection fraction, by estimation, is 60 to 65%. The left ventricle has normal function. The left ventricle has no regional wall motion abnormalities. Left ventricular diastolic parameters are indeterminate.  2. Right ventricular systolic function is normal. The right ventricular size is normal.  3. The mitral valve was not well visualized. No evidence of mitral valve regurgitation. No evidence of mitral stenosis.  4. The aortic valve was not well visualized. Aortic valve regurgitation is not visualized. No aortic stenosis is present. FINDINGS  Left Ventricle: Left ventricular ejection fraction, by estimation, is 60 to 65%. The left ventricle has normal function. The left ventricle has no regional wall motion abnormalities. The left ventricular internal cavity size was normal  in size. There is  no left ventricular hypertrophy. Left ventricular diastolic parameters are indeterminate. Right Ventricle: The right ventricular size is normal. No increase in right ventricular wall thickness. Right ventricular systolic function is normal. Left Atrium: Left atrial size was normal in size. Right Atrium: Right atrial size was normal in size. Pericardium: There is no evidence of pericardial effusion. Mitral Valve: The mitral valve was not well visualized. No evidence of mitral valve regurgitation. No evidence of mitral valve stenosis. Tricuspid Valve: The tricuspid valve is normal in structure. Tricuspid valve regurgitation is not demonstrated. No evidence of tricuspid stenosis. Aortic Valve: The aortic valve was not well visualized. Aortic valve regurgitation is not visualized. No aortic stenosis is present. Aortic valve mean gradient measures 5.2 mmHg. Aortic valve peak gradient measures 9.3 mmHg. Aortic valve area, by VTI measures 2.81 cm. Pulmonic Valve: The pulmonic valve was not well visualized. Pulmonic valve regurgitation is not visualized. No evidence of pulmonic stenosis. Aorta: The aortic root was not well visualized. Pulmonary Artery: Indeterminate PASP, inadequate TR jet. Venous: The inferior vena cava was not well visualized. IAS/Shunts: The interatrial septum was not well visualized. Additional Comments: Mild ascites is present.  LEFT VENTRICLE PLAX 2D LVIDd:         4.96 cm  Diastology LVIDs:         3.02 cm  LV e' lateral:   14.80 cm/s LV PW:         0.90 cm  LV E/e' lateral: 3.8 LV IVS:        0.86 cm  LV e' medial:    8.16 cm/s LVOT diam:     2.30 cm  LV E/e' medial:  6.9 LV SV:         86 LV SV Index:   34 LVOT Area:     4.15 cm  RIGHT VENTRICLE RV S prime:     12.60 cm/s TAPSE (M-mode): 2.8 cm LEFT ATRIUM  Index LA diam:        4.30 cm 1.72 cm/m LA Vol (A2C):   49.2 ml 19.63 ml/m LA Vol (A4C):   38.5 ml 15.36 ml/m LA Biplane Vol: 47.3 ml 18.87 ml/m  AORTIC VALVE  AV Area (Vmax):    2.62 cm AV Area (Vmean):   2.50 cm AV Area (VTI):     2.81 cm AV Vmax:           152.23 cm/s AV Vmean:          107.685 cm/s AV VTI:            0.307 m AV Peak Grad:      9.3 mmHg AV Mean Grad:      5.2 mmHg LVOT Vmax:         95.98 cm/s LVOT Vmean:        64.882 cm/s LVOT VTI:          0.208 m LVOT/AV VTI ratio: 0.68  AORTA Ao Root diam: 3.40 cm MITRAL VALVE MV Area (PHT): 3.81 cm    SHUNTS MV Decel Time: 199 msec    Systemic VTI:  0.21 m MV E velocity: 56.10 cm/s  Systemic Diam: 2.30 cm MV A velocity: 54.80 cm/s MV E/A ratio:  1.02 Carlyle Dolly MD Electronically signed by Carlyle Dolly MD Signature Date/Time: 09/10/2019/4:56:54 PM    Final    US LIVER DOPPLER  Result Date: 09/11/2019 CLINICAL DATA:  Cirrhosis, ascites, nonocclusive portal thrombus by CT EXAM: DUPLEX ULTRASOUND OF LIVER TECHNIQUE: Color and duplex Doppler ultrasound was performed to evaluate the hepatic in-flow and out-flow vessels. COMPARISON:  08/17/2019 CT FINDINGS: Liver: Increased echogenicity with surface nodularity compatible with hepatic cirrhosis. No focal lesion, mass or intrahepatic biliary ductal dilatation. Main Portal Vein size: 2.7 cm Portal Vein Velocities Main Prox:  35 cm/sec Main Mid: 39 cm/sec Main Dist:  33 cm/sec Right: 26 cm/sec Left: 23 cm/sec Hepatic Vein Velocities Right:  40 cm/sec Middle:  27 cm/sec Left:  46 cm/sec IVC: Present and patent with normal respiratory phasicity. Hepatic Artery Velocity:  37 cm/sec Splenic Vein Velocity:  18 cm/sec Spleen: 18 cm x 5.5 cm x 22 cm with a total volume of 1163 cm^3 (411 cm^3 is upper limit normal) Portal Vein Occlusion/Thrombus: Nonocclusive hypoechoic thrombus along the main portal vein wall posteriorly extending into the right portal vein. This correlates with the CT. Splenic Vein Occlusion/Thrombus: No Ascites: Moderate ascites present. Varices: None IMPRESSION: Nonocclusive portal vein thrombus within the main portal vein extending into the right  portal vein. Hepatopetal flow is maintained. Hepatic cirrhosis and ascites.  Associated splenomegaly. Electronically Signed   By: Jerilynn Mages.  Shick M.D.   On: 09/11/2019 16:02     CBC Recent Labs  Lab 09/14/19 0512 09/15/19 0448 09/16/19 0700 09/17/19 0000 09/18/19 0536  WBC 4.3 4.6 4.6 4.1 3.5*  HGB 11.3* 10.7* 11.1* 9.4* 9.4*  HCT 36.3* 34.0* 35.4* 29.3* 29.2*  PLT 101* 107* 109* 99* 95*  MCV 93.3 93.7 92.9 92.7 93.0  MCH 29.0 29.5 29.1 29.7 29.9  MCHC 31.1 31.5 31.4 32.1 32.2  RDW 14.2 14.1 14.3 14.2 14.7    Chemistries  Recent Labs  Lab 09/13/19 0551 09/14/19 0512 09/15/19 0448 09/17/19 0000 09/18/19 0536  NA 138 139 138 135 137  K 4.4 3.7 3.8 3.9 4.0  CL 101 101 101 102 100  CO2 31 30 30 27 26   GLUCOSE 78 85 88 95 88  BUN 12 11 15 17  15  CREATININE 0.70 0.72 0.76 0.68 0.72  CALCIUM 8.5* 8.6* 8.3* 8.4* 9.1  AST 18  --  21  --  22  ALT 12  --  12  --  16  ALKPHOS 53  --  54  --  55  BILITOT 1.1  --  0.6  --  0.6   ------------------------------------------------------------------------------------------------------------------ No results for input(s): CHOL, HDL, LDLCALC, TRIG, CHOLHDL, LDLDIRECT in the last 72 hours.  No results found for: HGBA1C ------------------------------------------------------------------------------------------------------------------ No results for input(s): TSH, T4TOTAL, T3FREE, THYROIDAB in the last 72 hours.  Invalid input(s): FREET3 ------------------------------------------------------------------------------------------------------------------ No results for input(s): VITAMINB12, FOLATE, FERRITIN, TIBC, IRON, RETICCTPCT in the last 72 hours.  Coagulation profile No results for input(s): INR, PROTIME in the last 168 hours.  No results for input(s): DDIMER in the last 72 hours.  Cardiac Enzymes No results for input(s): CKMB, TROPONINI, MYOGLOBIN in the last 168 hours.  Invalid input(s):  CK ------------------------------------------------------------------------------------------------------------------    Component Value Date/Time   BNP 44.0 08/25/2019 1329    Yarethzi Branan M.D on 09/18/2019 at 6:08 PM  Go to [email or web address] - for contact info  [facility] - Office  [phone]

## 2019-09-19 DIAGNOSIS — K635 Polyp of colon: Secondary | ICD-10-CM

## 2019-09-19 DIAGNOSIS — R188 Other ascites: Secondary | ICD-10-CM

## 2019-09-19 DIAGNOSIS — K7200 Acute and subacute hepatic failure without coma: Secondary | ICD-10-CM

## 2019-09-19 LAB — CBC
HCT: 27.3 % — ABNORMAL LOW (ref 39.0–52.0)
Hemoglobin: 8.9 g/dL — ABNORMAL LOW (ref 13.0–17.0)
MCH: 30 pg (ref 26.0–34.0)
MCHC: 32.6 g/dL (ref 30.0–36.0)
MCV: 91.9 fL (ref 80.0–100.0)
Platelets: 96 10*3/uL — ABNORMAL LOW (ref 150–400)
RBC: 2.97 MIL/uL — ABNORMAL LOW (ref 4.22–5.81)
RDW: 14.8 % (ref 11.5–15.5)
WBC: 4.5 10*3/uL (ref 4.0–10.5)
nRBC: 0 % (ref 0.0–0.2)

## 2019-09-19 LAB — HEPARIN LEVEL (UNFRACTIONATED)
Heparin Unfractionated: <0.1 IU/mL — ABNORMAL LOW (ref 0.30–0.70)
Heparin Unfractionated: 0.38 IU/mL (ref 0.30–0.70)

## 2019-09-19 MED ORDER — HEPARIN (PORCINE) 25000 UT/250ML-% IV SOLN
2300.0000 [IU]/h | INTRAVENOUS | Status: DC
  Administered 2019-09-19 (×2): 2300 [IU]/h via INTRAVENOUS
  Filled 2019-09-19: qty 250

## 2019-09-19 MED ORDER — HEPARIN (PORCINE) 25000 UT/250ML-% IV SOLN
2300.0000 [IU]/h | INTRAVENOUS | Status: AC
  Administered 2019-09-19 (×2): 2300 [IU]/h via INTRAVENOUS
  Filled 2019-09-19: qty 250

## 2019-09-19 MED ORDER — APIXABAN 5 MG PO TABS
5.0000 mg | ORAL_TABLET | Freq: Two times a day (BID) | ORAL | Status: DC
  Administered 2019-09-20 – 2019-09-22 (×5): 5 mg via ORAL
  Filled 2019-09-19 (×6): qty 1

## 2019-09-19 MED ORDER — APIXABAN 5 MG PO TABS: 5.0000 mg | ORAL_TABLET | Freq: Two times a day (BID) | ORAL | Status: DC

## 2019-09-19 NOTE — Plan of Care (Signed)
Heparin drip infusing. Discussed plan of care at the bedside during shift change. All questions answered and patient agreeable with plan of care.    Problem: Education: Goal: Knowledge of General Education information will improve Description: Including pain rating scale, medication(s)/side effects and non-pharmacologic comfort measures Outcome: Progressing

## 2019-09-19 NOTE — Progress Notes (Signed)
ANTICOAGULATION CONSULT NOTE  Pharmacy Consult for Apixaban>>>Heparin Indication: portal vein thrombosis  No Known Allergies  Patient Measurements: Height: 5' 9"  (175.3 cm) Weight: 115.7 kg (255 lb) IBW/kg (Calculated) : 70.7 HEPARIN DW (KG): 104.7  Vital Signs: Temp: 98.1 F (36.7 C) (04/15 2045) Temp Source: Oral (04/15 2045) BP: 109/73 (04/15 2045) Pulse Rate: 78 (04/15 2045)  Labs: Recent Labs    09/16/19 0700 09/16/19 1336 09/17/19 0000 09/17/19 0000 09/17/19 0710 09/17/19 0848 09/18/19 0536 09/19/19 0301  HGB   < >  --  9.4*   < >  --   --  9.4* 8.9*  HCT   < >  --  29.3*  --   --   --  29.2* 27.3*  PLT   < >  --  99*  --   --   --  95* 96*  APTT  --  >200* 163*  --  164*  --   --   --   HEPARINUNFRC   < > 0.92* 0.49   < >  --  0.51 0.27* <0.10*  CREATININE  --   --  0.68  --   --   --  0.72  --    < > = values in this interval not displayed.    Estimated Creatinine Clearance: 147.8 mL/min (by C-G formula based on SCr of 0.72 mg/dL).   Assessment: Pharmacy consulted to dose heparin for this 44 yo male with portal vein thrombosis.  Upper endoscopy showed no acute bleeding. Patient has low baseline platelets, but he has a history of hepatic cirrhosis.    Patient received 1 dose of apixaban 10 mg 4/12 1121 before switched back to heparin infusion.  Currently using heparin level to guide dosing.  Heparin level is undetectable post restart.  No issue with heparin infusion nor bleeding.   Goal of Therapy: .  Heparin level 0.3-0.5 units/mL, per MD d/t bleeding risk Monitor platelets by anticoagulation protocol: Yes   Plan:  Increase heparin gtt to 2300 units/hr Check 6 hr heparin level  Lenetta Piche D. Mina Marble, PharmD, BCPS, [place] 09/19/2019, 3:39 AM

## 2019-09-19 NOTE — Progress Notes (Signed)
ANTICOAGULATION CONSULT NOTE  Pharmacy Consult for Apixaban>>>Heparin-->apixaban to start 09/20/19 in am Indication: portal vein thrombosis  No Known Allergies  Patient Measurements: Height: 5' 9"  (175.3 cm) Weight: 112.1 kg (247 lb 3.2 oz) IBW/kg (Calculated) : 70.7 HEPARIN DW (KG): 104.7  Vital Signs: Temp: 97.8 F (36.6 C) (04/16 1404) Temp Source: Oral (04/16 1404) BP: 111/80 (04/16 1404) Pulse Rate: 92 (04/16 1404)  Labs: Recent Labs     0000 09/17/19 0000 09/17/19 0710 09/17/19 0848 09/18/19 0536 09/19/19 0301 09/19/19 1757  HGB   < > 9.4*  --   --  9.4* 8.9*  --   HCT  --  29.3*  --   --  29.2* 27.3*  --   PLT  --  99*  --   --  95* 96*  --   APTT  --  163* 164*  --   --   --   --   HEPARINUNFRC  --  0.49  --    < > 0.27* <0.10* 0.38  CREATININE  --  0.68  --   --  0.72  --   --    < > = values in this interval not displayed.    Estimated Creatinine Clearance: 145.5 mL/min (by C-G formula based on SCr of 0.72 mg/dL).   Assessment: Pharmacy consulted to dose heparin for this 44 yo male with portal vein thrombosis.  Upper endoscopy showed no acute bleeding. Patient has low baseline platelets, but he has a history of hepatic cirrhosis.    09/19/19 Pt had colonoscopy yesterday that removed 12 polyps, one of which required a clip Re-start heparin infusion and transition to Eliquis 80m bid Provider requested that Eliquis be started at ma dose d/t bleed risk  4/16 evening: HL therapeutic at 0.38 No bleeding or infusion related issues noted    Goal of Therapy: .  Heparin level 0.3-0.5 units/mL, per MD d/t bleeding risk Monitor platelets by anticoagulation protocol: Yes   Plan:  Continue heparin infusion at  2300 units/hr until 0800 on 09/20/19 Start apixaban 523mbid on [id number] at 1000 Monitor HL and CBC daily while on heparin Monitor signs/symptoms of bleeding    MeShela CommonsPharm. D., BCPS Clinical Pharmacist 09/19/2019 6:53 PM

## 2019-09-19 NOTE — Progress Notes (Signed)
[place] for Apixaban>>>Heparin-->apixaban to start 09/20/19 in am Indication: portal vein thrombosis  No Known Allergies  Patient Measurements: Height: 5' 9"  (175.3 cm) Weight: 112.1 kg (247 lb 3.2 oz) IBW/kg (Calculated) : 70.7 HEPARIN DW (KG): 104.7  Vital Signs: Temp: 98.1 F (36.7 C) (04/16 0526) Temp Source: Oral (04/16 0526) BP: 112/71 (04/16 0526) Pulse Rate: 84 (04/16 0526)  Labs: Recent Labs    09/16/19 1336 09/16/19 1336 09/17/19 0000 09/17/19 0000 09/17/19 0710 09/17/19 0848 09/18/19 0536 09/19/19 0301  HGB  --   --  9.4*   < >  --   --  9.4* 8.9*  HCT  --   --  29.3*  --   --   --  29.2* 27.3*  PLT  --   --  99*  --   --   --  95* 96*  APTT >200*  --  163*  --  164*  --   --   --   HEPARINUNFRC 0.92*   < > 0.49   < >  --  0.51 0.27* <0.10*  CREATININE  --   --  0.68  --   --   --  0.72  --    < > = values in this interval not displayed.    Estimated Creatinine Clearance: 145.5 mL/min (by C-G formula based on SCr of 0.72 mg/dL).   Assessment: Pharmacy consulted to dose heparin for this 44 yo male with portal vein thrombosis.  Upper endoscopy showed no acute bleeding. Patient has low baseline platelets, but he has a history of hepatic cirrhosis.    09/19/19 Pt had colonoscopy yesterday that removed 12 polyps, one of which required a clip Re-start heparin infusion and transition to Eliquis 59m bid Provider requested that Eliquis be started at ma dose d/t bleed risk     Goal of Therapy: .  Heparin level 0.3-0.5 units/mL, per MD d/t bleeding risk Monitor platelets by anticoagulation protocol: Yes   Plan:  Continue heparin infusion at  2300 units/hr until 0800 on 09/20/19 Start apixaban 58mbid on [id number] at 1000 Check 6- hr heparin level at 1800 today  Monitor HL and CBC daily while on heparin Monitor signs/symptoms of bleeding    TaDespina PolePharm. D. Clinical Pharmacist 09/19/2019 11:37 AM

## 2019-09-19 NOTE — Progress Notes (Signed)
Patient Demographics:    Alejandro Porter, is a 44 y.o. male, DOB - Nov 07, 1975, JME:[id number]  Admit date - 09/10/2019   Admitting Physician Orson Eva, MD  Outpatient Primary MD for the patient is [facility]  LOS - 9   Chief Complaint  Patient presents with  . Ascites        Subjective:    Alejandro Porter today has no fevers, no emesis,  No chest pain,   - -Cousin and father at bedside -No bleeding concerns at this time   Assessment  & Plan :    Active Problems:   Cirrhosis of liver with ascites (HCC)   Portal vein thrombosis   Colorectal polyps   Cecal polyp   Acute liver failure without hepatic coma   Other ascites  Brief summary 44 y.o. 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 Child Pugh B. -s/p paracentesis on 09/10/2019 with removal of 8 L -Patient failed outpatient diuresis with oral agents, --- treated with IV Lasix with IV albumin and p.o. Aldactone -GI consult appreciated -he will need vaccination against hep B and hep A as outpatient -We will most likely need outpatient paracentesis from time to time  2)Portal vein thrombosis, nonocclusive, extending into right intrahepatic portal vein, first  noted at time of CT 08/16/20---  EGD showed grade 1 and grade 2 esophageal varices without stigmata of bleeding -Discussed with Dr. Delton Coombes from hematology -GI service also recommends anticoagulation ---c/n IV heparin, plan to transition to p.o. Eliquis if no bleeding and if platelet counts are stable -Platelets were 185 on 08/25/2019,  -Platelets down to 90 K (platelet counts were already drifting down prior to initiation of IV heparin on 09/12/2019 Platelets 185 (08/25/19),  Platelets this admission- 111>>106>>>90>>101>>107>>95>>96 -Monitor platelets and H&H, monitor for bleeding -Risk versus benefit of full anticoagulation discussed with patient, his father and his cousin at bedside----they have decided to go ahead with full anticoagulation -Okay to transition from IV heparin to Eliquis on 09/20/2019 per GI service--- Eliquis will be given at maintenance dose without high-dose loading to increase bleeding risk  3)H/o COVID 19 infection--diagnosed 08/25/2019, repeat Covid test negative on 09/10/2019 -Asymptomatic,   4)HTN--stopped metoprolol, continue propanolol due to moderate portal hypertension  5)Esophageal dysphagia secondary Schatzki's ring.  Patient underwent esophageal dilation on 09/12/2019--- tolerating oral intake well  6)Acute Gi Bleed- -Patient with heme positive stool in setting of esophageal varices and need for anticoagulation for portal vein thrombosis -Discussed with GI service  -Hemoglobin down to 8.9 from 11.1 -Colonoscopy report from 09/18/2019 as noted above and below with polyps, internal and internal hemorrhoids and diverticulosis -EGD report from 09/12/2019 as noted below  -- 7) abdominal discomfort post paracentesis--- irritation  around left anterior abdominal wall noted ----IV Rocephin for SBP prophylaxis should cover for any possible infection in this area as well -Overall improving  Disposition/Need for in-Hospital Stay- patient unable to be discharged at this time  due to ---decompensated liver cirrhosis requiring aggressive iv diuresis and portal vein thrombosis requiring IV heparin -Patient From: Home D/C Place: Home Barriers: Not Clinically Stable- --Patient failed outpatient diuresis with oral agents, continueIV heparin - -Plan to switch to Eliquis on 09/20/2019 monitor for 24 hours on Eliquis if no bleeding possible discharge home after that  Code Status : Full code  Family Communication:   (patient is alert, awake and coherent) Cousin and father at bedside  Consults  :  GI/phone consult with Dr. Delton Coombes from hematology  Procedures:- 1) paracentesis on 09/10/2019 with removal of 8 L 2)EGD on 09/12/2019- Small hiatal hernia. Mild erosive reflux esophagitis. Schatzki's ring - status post dilation   - Small grade 1/grade 2 esophageal varices without  bleeding stigmata - Portal hypertensive gastropathy 3)--Colonoscopy on 09/18/2019 with internal and external hemorrhoids, diverticulosis, at least 12 polyps removed from the cecum, transverse colon, descending colon and sigmoid and rectum  DVT Prophylaxis  : -IV heparin  Lab Results  Component Value Date   PLT 96 (L) 09/19/2019   Inpatient Medications  Scheduled Meds: . [START ON 09/20/2019] apixaban  5 mg Oral BID  . furosemide  40 mg Intravenous BID  . gabapentin  300 mg Oral QHS  . lidocaine (PF)  5 mL Intradermal Once  . polyethylene glycol  17 g Oral Daily  . spironolactone  100 mg Oral BID WC   Continuous Infusions: . cefTRIAXone (ROCEPHIN)  IV 2 g (09/19/19 1416)  . heparin 2,300 Units/hr (09/19/19 1922)   PRN Meds:.  Anti-infectives (From admission, onward)   Start     Dose/Rate Route Frequency Ordered Stop   09/16/19 1300  cefTRIAXone (ROCEPHIN) 2 g in sodium chloride 0.9 % 100 mL IVPB     2 g 200 mL/hr over 30 Minutes Intravenous Every 24 hours 09/16/19 1216 09/21/19 1259       Objective:   Vitals:   09/18/19 2045 09/19/19 0526 09/19/19 0852 09/19/19 1404  BP: 109/73  112/71  111/80  Pulse: 78 84  92  Resp: 16 16  18   Temp: 98.1 F (36.7 C) 98.1 F (36.7 C)  97.8 F (36.6 C)  TempSrc: Oral Oral  Oral  SpO2: 97% 98% 96% 96%  Weight:  112.1 kg    Height:        Wt Readings from Last 3 Encounters:  09/19/19 112.1 kg  09/10/19 (!) 142.9 kg  08/25/19 133.8 kg     Intake/Output Summary (Last 24 hours) at 09/19/2019 2004 Last data filed at 09/19/2019 1300 Gross per 24 hour  Intake 775.19 ml  Output --  Net 775.19 ml    Physical Exam  Gen:- Awake Alert, chronically ill-appearing  HEENT:- [place].AT,   Neck-Supple Neck,No JVD,.  Lungs-  CTAB , fair symmetrical air movement CV- S1, S2 normal, regular  Abd-diminished bowel sounds, no significant tenderness, ascites with fluid thrill noted, Lt anterior abd wall erythema, tenderness and induration at prior paracentesis site improving Extremity/Skin:-2+ pitting edema, pedal pulses present  Psych-affect is appropriate, oriented x3 Neuro-generalized weakness, no new focal deficits, no tremors   Data Review:   Micro Results Recent Results (from the past 240 hour(s))  Culture, body fluid-bottle     Status: None   Collection Time: 09/10/19 10:27 AM  Specimen: Ascitic  Result Value Ref Range Status   Specimen Description ASCITIC  Final   Special Requests BOTTLES DRAWN AEROBIC AND ANAEROBIC 10CC  Final   Culture   Final    NO GROWTH 5 DAYS Performed at [facility], [address], [place], [place] [address]    Report Status 09/15/2019 FINAL  Final  Gram stain     Status: None   Collection Time: 09/10/19 10:27 AM   Specimen: Ascitic  Result Value Ref Range Status   Specimen Description ASCITIC  Final   Special Requests NONE  Final   Gram Stain   Final    NO ORGANISMS SEEN WBC PRESENT, PREDOMINANTLY MONONUCLEAR CYTOSPIN SMEAR Performed at [facility] [address], [place], [place] [address]    Report Status 09/10/2019 FINAL  Final  SARS CORONAVIRUS 2 (TAT 6-24 HRS) Nasopharyngeal  Nasopharyngeal Swab     Status: None   Collection Time: 09/10/19 12:09 PM   Specimen: Nasopharyngeal Swab  Result Value Ref Range Status   SARS Coronavirus 2 NEGATIVE NEGATIVE Final    Comment: (NOTE) SARS-CoV-2 target nucleic acids are NOT DETECTED. The SARS-CoV-2 RNA is generally detectable in upper and lower respiratory specimens during the acute phase of infection. Negative results do not preclude SARS-CoV-2 infection, do not rule out co-infections with other pathogens, and should not be used as the sole basis for treatment or other patient management decisions. Negative results must be combined with clinical observations, patient history, and epidemiological information. The expected result is Negative. Fact Sheet for Patients: SugarRoll.be Fact Sheet for Healthcare Providers: https://www.woods-mathews.com/ This test is not yet approved or cleared by the [place] FDA and  has been authorized for detection and/or diagnosis of SARS-CoV-2 by FDA under an Emergency Use Authorization (EUA). This EUA will remain  in effect (meaning this test can be used) for the duration of the COVID-19 declaration under Section 56 4(b)(1) of the Act, 21 U.S.C. section 360bbb-3(b)(1), unless the authorization is terminated or revoked sooner. Performed at [facility], [place] [address], [place], [place] [address]    Radiology Reports DG Chest Siloam 1 View  Result Date: 09/10/2019 CLINICAL DATA:  Shortness of breath. EXAM: PORTABLE CHEST 1 VIEW COMPARISON:  August 25, 2019. FINDINGS: Stable cardiomediastinal silhouette. No pneumothorax is noted. Increased left basilar atelectasis or infiltrate is noted. Minimal right basilar subsegmental atelectasis or infiltrate is noted. Bony thorax is unremarkable. IMPRESSION: Increased bibasilar subsegmental atelectasis or infiltrates, left greater than right. Electronically Signed   By: Marijo Conception M.D.   On:  09/10/2019 10:14   DG Chest Portable 1 View  Result Date: 08/25/2019 CLINICAL DATA:  Shortness of breath. Abdominal swelling and leg swelling. EXAM: PORTABLE CHEST 1 VIEW COMPARISON:  08/17/2019 FINDINGS: The heart size is within normal limits. Slight pulmonary vascular prominence. Minimal atelectasis at the left lung base. No pulmonary edema or visible pleural effusions. No bone abnormality. IMPRESSION: Slight pulmonary vascular prominence. Electronically Signed   By: Lorriane Shire M.D.   On: 08/25/2019 13:12   ECHOCARDIOGRAM COMPLETE  Result Date: 09/10/2019    ECHOCARDIOGRAM REPORT   Patient Name:   ANDRES VEST Date of Exam: 09/10/2019 Medical Rec #:  [id number]       Height:       69.0 in Accession #:    [id number]      Weight:       315.0 lb Date of Birth:  11-20-1975        BSA:  2.506 m Patient Age:    79 years        BP:           134/94 mmHg Patient Gender: M               HR:           92 bpm. Exam Location:  [facility] Procedure: 2D Echo Indications:    Dyspnea 786.09 / R06.00  History:        Patient has no prior history of Echocardiogram examinations.                 Risk Factors:Non-Smoker and Hypertension. Edema, Portal vein                 thrombosis, History of COVID 19.  Sonographer:    Leavy Cella RDCS (AE) Referring Phys: [id number] DAVID TAT IMPRESSIONS  1. Left ventricular ejection fraction, by estimation, is 60 to 65%. The left ventricle has normal function. The left ventricle has no regional wall motion abnormalities. Left ventricular diastolic parameters are indeterminate.  2. Right ventricular systolic function is normal. The right ventricular size is normal.  3. The mitral valve was not well visualized. No evidence of mitral valve regurgitation. No evidence of mitral stenosis.  4. The aortic valve was not well visualized. Aortic valve regurgitation is not visualized. No aortic stenosis is present. FINDINGS  Left Ventricle: Left ventricular ejection fraction, by estimation, is  60 to 65%. The left ventricle has normal function. The left ventricle has no regional wall motion abnormalities. The left ventricular internal cavity size was normal in size. There is  no left ventricular hypertrophy. Left ventricular diastolic parameters are indeterminate. Right Ventricle: The right ventricular size is normal. No increase in right ventricular wall thickness. Right ventricular systolic function is normal. Left Atrium: Left atrial size was normal in size. Right Atrium: Right atrial size was normal in size. Pericardium: There is no evidence of pericardial effusion. Mitral Valve: The mitral valve was not well visualized. No evidence of mitral valve regurgitation. No evidence of mitral valve stenosis. Tricuspid Valve: The tricuspid valve is normal in structure. Tricuspid valve regurgitation is not demonstrated. No evidence of tricuspid stenosis. Aortic Valve: The aortic valve was not well visualized. Aortic valve regurgitation is not visualized. No aortic stenosis is present. Aortic valve mean gradient measures 5.2 mmHg. Aortic valve peak gradient measures 9.3 mmHg. Aortic valve area, by VTI measures 2.81 cm. Pulmonic Valve: The pulmonic valve was not well visualized. Pulmonic valve regurgitation is not visualized. No evidence of pulmonic stenosis. Aorta: The aortic root was not well visualized. Pulmonary Artery: Indeterminate PASP, inadequate TR jet. Venous: The inferior vena cava was not well visualized. IAS/Shunts: The interatrial septum was not well visualized. Additional Comments: Mild ascites is present.  LEFT VENTRICLE PLAX 2D LVIDd:         4.96 cm  Diastology LVIDs:         3.02 cm  LV e' lateral:   14.80 cm/s LV PW:         0.90 cm  LV E/e' lateral: 3.8 LV IVS:        0.86 cm  LV e' medial:    8.16 cm/s LVOT diam:     2.30 cm  LV E/e' medial:  6.9 LV SV:         86 LV SV Index:   34 LVOT Area:     4.15 cm  RIGHT VENTRICLE RV S prime:  12.60 cm/s TAPSE (M-mode): 2.8 cm LEFT ATRIUM              Index LA diam:        4.30 cm 1.72 cm/m LA Vol (A2C):   49.2 ml 19.63 ml/m LA Vol (A4C):   38.5 ml 15.36 ml/m LA Biplane Vol: 47.3 ml 18.87 ml/m  AORTIC VALVE AV Area (Vmax):    2.62 cm AV Area (Vmean):   2.50 cm AV Area (VTI):     2.81 cm AV Vmax:           152.23 cm/s AV Vmean:          107.685 cm/s AV VTI:            0.307 m AV Peak Grad:      9.3 mmHg AV Mean Grad:      5.2 mmHg LVOT Vmax:         95.98 cm/s LVOT Vmean:        64.882 cm/s LVOT VTI:          0.208 m LVOT/AV VTI ratio: 0.68  AORTA Ao Root diam: 3.40 cm MITRAL VALVE MV Area (PHT): 3.81 cm    SHUNTS MV Decel Time: 199 msec    Systemic VTI:  0.21 m MV E velocity: 56.10 cm/s  Systemic Diam: 2.30 cm MV A velocity: 54.80 cm/s MV E/A ratio:  1.02 Carlyle Dolly MD Electronically signed by Carlyle Dolly MD Signature Date/Time: 09/10/2019/4:56:54 PM    Final    US LIVER DOPPLER  Result Date: 09/11/2019 CLINICAL DATA:  Cirrhosis, ascites, nonocclusive portal thrombus by CT EXAM: DUPLEX ULTRASOUND OF LIVER TECHNIQUE: Color and duplex Doppler ultrasound was performed to evaluate the hepatic in-flow and out-flow vessels. COMPARISON:  08/17/2019 CT FINDINGS: Liver: Increased echogenicity with surface nodularity compatible with hepatic cirrhosis. No focal lesion, mass or intrahepatic biliary ductal dilatation. Main Portal Vein size: 2.7 cm Portal Vein Velocities Main Prox:  35 cm/sec Main Mid: 39 cm/sec Main Dist:  33 cm/sec Right: 26 cm/sec Left: 23 cm/sec Hepatic Vein Velocities Right:  40 cm/sec Middle:  27 cm/sec Left:  46 cm/sec IVC: Present and patent with normal respiratory phasicity. Hepatic Artery Velocity:  37 cm/sec Splenic Vein Velocity:  18 cm/sec Spleen: 18 cm x 5.5 cm x 22 cm with a total volume of 1163 cm^3 (411 cm^3 is upper limit normal) Portal Vein Occlusion/Thrombus: Nonocclusive hypoechoic thrombus along the main portal vein wall posteriorly extending into the right portal vein. This correlates with the CT. Splenic Vein  Occlusion/Thrombus: No Ascites: Moderate ascites present. Varices: None IMPRESSION: Nonocclusive portal vein thrombus within the main portal vein extending into the right portal vein. Hepatopetal flow is maintained. Hepatic cirrhosis and ascites.  Associated splenomegaly. Electronically Signed   By: Jerilynn Mages.  Shick M.D.   On: 09/11/2019 16:02     CBC Recent Labs  Lab 09/15/19 0448 09/16/19 0700 09/17/19 0000 09/18/19 0536 09/19/19 0301  WBC 4.6 4.6 4.1 3.5* 4.5  HGB 10.7* 11.1* 9.4* 9.4* 8.9*  HCT 34.0* 35.4* 29.3* 29.2* 27.3*  PLT 107* 109* 99* 95* 96*  MCV 93.7 92.9 92.7 93.0 91.9  MCH 29.5 29.1 29.7 29.9 30.0  MCHC 31.5 31.4 32.1 32.2 32.6  RDW 14.1 14.3 14.2 14.7 14.8    Chemistries  Recent Labs  Lab 09/13/19 0551 09/14/19 0512 09/15/19 0448 09/17/19 0000 09/18/19 0536  NA 138 139 138 135 137  K 4.4 3.7 3.8 3.9 4.0  CL 101 101 101 102 100  CO2 31 30 30 27 26   GLUCOSE 78 85 88 95 88  BUN 12 11 15 17 15   CREATININE 0.70 0.72 0.76 0.68 0.72  CALCIUM 8.5* 8.6* 8.3* 8.4* 9.1  AST 18  --  21  --  22  ALT 12  --  12  --  16  ALKPHOS 53  --  54  --  55  BILITOT 1.1  --  0.6  --  0.6   ------------------------------------------------------------------------------------------------------------------ No results for input(s): CHOL, HDL, LDLCALC, TRIG, CHOLHDL, LDLDIRECT in the last 72 hours.  No results found for: HGBA1C ------------------------------------------------------------------------------------------------------------------ No results for input(s): TSH, T4TOTAL, T3FREE, THYROIDAB in the last 72 hours.  Invalid input(s): FREET3 ------------------------------------------------------------------------------------------------------------------ No results for input(s): VITAMINB12, FOLATE, FERRITIN, TIBC, IRON, RETICCTPCT in the last 72 hours.  Coagulation profile No results for input(s): INR, PROTIME in the last 168 hours.  No results for input(s): DDIMER in the last 72  hours.  Cardiac Enzymes No results for input(s): CKMB, TROPONINI, MYOGLOBIN in the last 168 hours.  Invalid input(s): CK ------------------------------------------------------------------------------------------------------------------    Component Value Date/Time   BNP 44.0 08/25/2019 1329    Leiani Enright M.D on 09/19/2019 at 8:04 PM  Go to [email or web address] - for contact info  [facility] - Office  [phone]

## 2019-09-19 NOTE — Progress Notes (Signed)
Subjective: Feels a lot better. Is wondering when he may be able to go home. His cousin is going to help him grocery shop to promote low sodium diet.  Objective: Vital signs in last 24 hours: Temp:  [97.8 F (36.6 C)-98.2 F (36.8 C)] 98.1 F (36.7 C) (04/16 0526) Pulse Rate:  [66-95] 84 (04/16 0526) Resp:  [15-28] 16 (04/16 0526) BP: (102-128)/(70-90) 112/71 (04/16 0526) SpO2:  [88 %-99 %] 98 % (04/16 0526) Weight:  [112.1 kg] 112.1 kg (04/16 0526) Last BM Date: 09/18/19 General:   Alert and oriented, pleasant Head:  Normocephalic and atraumatic. Eyes:  No icterus, sclera clear. Conjuctiva pink.  Heart:  S1, S2 present, no murmurs noted.  Lungs: Clear to auscultation bilaterally, without wheezing, rales, or rhonchi.  Abdomen:  Bowel sounds present, soft, non-tender, mildly distended. Para site appears scabbed over. No HSM or hernias noted. No rebound or guarding. No masses appreciated  Msk:  Symmetrical without gross deformities. Pulses:  Normal bilateral DP pulses noted. Extremities:  Edema improved, now 2+ pitting to bilateral knees. Neurologic:  Alert and  oriented x4;  grossly normal neurologically. Psych:  Alert and cooperative. Normal mood and affect.  Intake/Output from previous day: 04/15 0701 - 04/16 0700 In: 175.2 [I.V.:175.2] Out: -  Intake/Output this shift: No intake/output data recorded.  Lab Results: Recent Labs    09/17/19 0000 09/18/19 0536 09/19/19 0301  WBC 4.1 3.5* 4.5  HGB 9.4* 9.4* 8.9*  HCT 29.3* 29.2* 27.3*  PLT 99* 95* 96*   BMET Recent Labs    09/17/19 0000 09/18/19 0536  NA 135 137  K 3.9 4.0  CL 102 100  CO2 27 26  GLUCOSE 95 88  BUN 17 15  CREATININE 0.68 0.72  CALCIUM 8.4* 9.1   LFT Recent Labs    09/18/19 0536  PROT 6.7  ALBUMIN 3.1*  AST 22  ALT 16  ALKPHOS 55  BILITOT 0.6   PT/INR No results for input(s): LABPROT, INR in the last 72 hours. Hepatitis Panel No results for input(s): HEPBSAG, HCVAB, HEPAIGM,  HEPBIGM in the last 72 hours.   Studies/Results: No results found.  Assessment: 44 year old male with likely NASH cirrhosisMELD 8, admitted with decompensation to include anasarca/ascites and failing outpatient diuretic regimen, found to have portal vein thrombosis and now started on anticoagulationwith heparin and received 1 dose Eliquis morning of 09/15/19.  Anasarca/ascites- LVAP in ED on 4/7 with 8 liters removed. Negative cell count for SBP.S/p several doses of IV albumin followed by Lasix with last Albumin this morning. Also on spirolactone which was increased to 100 mg BID today. Will need to monitor potassium. Overall down 60 lbs since admission. Continue diuresis. Hold beta blocker due to difficult to manage ascites. Improved with 2+ pitting edema to bilateral knees. Abdomen much softer.  Anemia- Normocytic anemia with ferritin 193likely secondary tochronic disease. Reports of intermittent black stools prior to admission with two episodes this admission, heme+ 09/15/19.  Hemoglobin has been drifting this admission and is down to 9.4 yesterday (11.1 day before) and 8.9 today. 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  He has been on anticoagulation with heparin due to portal vein thrombosis and may be intermittently oozing from known portal gastropathy/recent polypectomy.  Colonoscopy yesterday with 12 polyps (path pending) and MR conditional clip placed. Restarted heparin  last night, can restart Eliquis 09/20/19.  Portal vein thrombosis-Unclear if acute or chronic. Started on anticoagulation this admission with heparin and plans for Eliquis at discharge. Endoscopic evaluation for intermittent melena, anemia, and heme positive stool. EGD and colonoscopy as outlined above. Patient may be continuing to  ooze portal gastropathy vs. Colon polyps, in the setting of anticoagulation. Outpatient anticoagulation may prove to be difficult if he continues to ooze. Will monitor.  Erythema of abdominal wall- Erythema noted around para site on left side of his abdomen. Associated warmth and mild tenderness. Started on Rocephin yesterday. Continue antibiotics. No gross erythema noted today other than at sites of 'stretch marks" from rapid fluid/weight gain. Paracentesis site appears 'scabbed over." No localized tenderness or warmth.  Plan: 1. Continue antibiotics per hospitalist 2. Hold beta blocker 3. Continue diuresis 4. Will discuss any further Albumin/Lasix runs with Dr. Oneida Alar 5. Supportive measures   Thank you for allowing [place] to participate in the care of Alejandro Porter, [place], AGNP-C Adult & Gerontological Nurse Practitioner [facility]    LOS: 9 days    09/19/2019, 8:22 AM

## 2019-09-20 ENCOUNTER — Encounter: Payer: Self-pay | Admitting: Internal Medicine

## 2019-09-20 DIAGNOSIS — R188 Other ascites: Secondary | ICD-10-CM

## 2019-09-20 DIAGNOSIS — R601 Generalized edema: Secondary | ICD-10-CM

## 2019-09-20 DIAGNOSIS — I81 Portal vein thrombosis: Secondary | ICD-10-CM

## 2019-09-20 DIAGNOSIS — K7460 Unspecified cirrhosis of liver: Secondary | ICD-10-CM

## 2019-09-20 LAB — CBC
HCT: 32.8 % — ABNORMAL LOW (ref 39.0–52.0)
Hemoglobin: 10.4 g/dL — ABNORMAL LOW (ref 13.0–17.0)
MCH: 29.4 pg (ref 26.0–34.0)
MCHC: 31.7 g/dL (ref 30.0–36.0)
MCV: 92.7 fL (ref 80.0–100.0)
Platelets: 101 10*3/uL — ABNORMAL LOW (ref 150–400)
RBC: 3.54 MIL/uL — ABNORMAL LOW (ref 4.22–5.81)
RDW: 15.2 % (ref 11.5–15.5)
WBC: 4.5 10*3/uL (ref 4.0–10.5)
nRBC: 0 % (ref 0.0–0.2)

## 2019-09-20 LAB — HEPARIN LEVEL (UNFRACTIONATED): Heparin Unfractionated: 0.46 IU/mL (ref 0.30–0.70)

## 2019-09-20 NOTE — Progress Notes (Signed)
   Assessment/Plan: ADMITTED WITH ANASARCA/GI BLEED. CLINICALLY IMPROVED. NO BRBPR OR MELENA.  BP/Hb STABLE.  PLAN: 1. CONTINUE ELIQUIS. 2. ANTICIPATE D/C IN  24 HR IF HB REMAINS STABLE. 3. CONTINUE LASIX/ALDACTONE BID.   Subjective: Since I last evaluated the patient HE DENIES BRBPR OR MELENA. MILD LEFT FLANK ABDOMINAL PAIN. NO CHEST PAIN.   Objective: Vital signs in last 24 hours: Vitals:   09/20/19 0518 09/20/19 0835  BP: 126/79   Pulse: 86   Resp: 19   Temp: 98.2 F (36.8 C)   SpO2: 96% 94%     General appearance: alert, cooperative and appears older than stated age Resp: clear to auscultation bilaterally Cardio: regular rate and rhythm GI: soft, non-tender; bowel sounds normal; no masses,  no organomegaly Extremities: edema 3-4+  Lab Results:  K/Cr NORMAL APR 16 Hb 10.4   Studies/Results: No results found.  Medications: I have reviewed the patient's current medications.

## 2019-09-20 NOTE — Progress Notes (Signed)
Patient Demographics:    Alejandro Porter, is a 44 y.o. male, DOB - 1975-07-10, XFG:[id number]  Admit date - 09/10/2019   Admitting Physician Orson Eva, MD  Outpatient Primary MD for the patient is [facility], [facility]  LOS - 10   Chief Complaint  Patient presents with  . Ascites        Subjective:    Alejandro Porter denies any blood in stools, no chest pain or shortness of breath.   Assessment  & Plan :    Active Problems:   Cirrhosis of liver with ascites (HCC)   Portal vein thrombosis   Colorectal polyps   Cecal polyp   Acute liver failure without hepatic coma   Other ascites  Brief summary 44 y.o. 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 Child Pugh B. -s/p paracentesis on 09/10/2019 with removal of 8 L -Patient failed outpatient diuresis with oral agents, --- treated with IV Lasix with IV albumin and p.o. Aldactone -GI consult appreciated -he will need vaccination against hep B and hep A as outpatient -We will most likely need outpatient paracentesis from time to time  2)Portal vein thrombosis, nonocclusive, extending into right intrahepatic portal vein, first noted at time of CT 08/16/20---  EGD showed grade 1 and  grade 2 esophageal varices without stigmata of bleeding -Discussed with Dr. Delton Coombes from hematology -GI service also recommends anticoagulation ---c/n IV heparin, plan to transition to p.o. Eliquis if no bleeding and if platelet counts are stable -Platelets were 185 on 08/25/2019,  -Platelets down to 90 K (platelet counts were already drifting down prior to initiation of IV heparin on 09/12/2019 Platelets 185 (08/25/19),  Platelets this admission- 111>>106>>>90>>101>>107>>95>>96 -Monitor platelets and H&H, monitor for bleeding -Risk versus benefit of full anticoagulation discussed with patient, his father and his cousin at bedside----they have decided to go ahead with full anticoagulation -Okay to transition from IV heparin to Eliquis on 09/20/2019 per GI service--- Eliquis will be given at maintenance dose without high-dose loading to increase bleeding risk -Reviewed with [facility] team and since patient does not have insurance, long-term Eliquis may not be feasible.  If there is no assistance program for him to get Eliquis after discharge, may need to consider starting on Coumadin.  3)H/o COVID 19 infection--diagnosed 08/25/2019, repeat Covid test negative on 09/10/2019 -Asymptomatic,   4)HTN--stopped metoprolol, continue propanolol due to moderate portal hypertension  5)Esophageal dysphagia secondary Schatzki's ring.  Patient underwent esophageal dilation on 09/12/2019--- tolerating oral intake well  6)Acute Gi Bleed- -Patient with heme positive stool in setting of esophageal varices and need for anticoagulation for portal vein thrombosis -Discussed with GI service  -Hemoglobin down to 8.9 from 11.1, now stable at 10.4 -Colonoscopy report  from 09/18/2019 as noted above and below with polyps, internal and internal hemorrhoids and diverticulosis -EGD report from 09/12/2019 as noted below  -- 7) abdominal discomfort post paracentesis--- irritation around left anterior abdominal wall noted ----IV Rocephin  for SBP prophylaxis should cover for any possible infection in this area as well -Overall improving  Disposition/Need for in-Hospital Stay- patient unable to be discharged at this time due to ---decompensated liver cirrhosis requiring aggressive iv diuresis  -Patient From: Home D/C Place: Home Barriers: Not Clinically Stable- --Patient failed outpatient diuresis with oral agents, continueIV heparin - -Patient has been switched to Eliquis, will monitor for any recurrent bleeding -Reviewed with [facility] team and since patient does not have insurance, long-term Eliquis may not be feasible.  If there is no assistance program for him to get Eliquis after discharge, may need to consider starting on Coumadin.  Code Status : Full code  Family Communication:   (patient is alert, awake and coherent) Cousin and father at bedside  Consults  :  GI/phone consult with Dr. Delton Coombes from hematology  Procedures:- 1) paracentesis on 09/10/2019 with removal of 8 L 2)EGD on 09/12/2019- Small hiatal hernia. Mild erosive reflux esophagitis. Schatzki's ring - status post dilation   - Small grade 1/grade 2 esophageal varices without  bleeding stigmata - Portal hypertensive gastropathy 3)--Colonoscopy on 09/18/2019 with internal and external hemorrhoids, diverticulosis, at least 12 polyps removed from the cecum, transverse colon, descending colon and sigmoid and rectum  DVT Prophylaxis  : apixiban  Lab Results  Component Value Date   PLT 101 (L) 09/20/2019   Inpatient Medications  Scheduled Meds: . apixaban  5 mg Oral BID  . furosemide  40 mg Intravenous BID  . gabapentin  300 mg Oral QHS  . lidocaine (PF)  5 mL Intradermal Once  . polyethylene glycol  17 g Oral Daily  . spironolactone  100 mg Oral BID WC   Continuous Infusions: . cefTRIAXone (ROCEPHIN)  IV 2 g (09/20/19 1404)   PRN Meds:.  Anti-infectives (From admission, onward)   Start     Dose/Rate Route Frequency Ordered Stop   09/16/19 1300   cefTRIAXone (ROCEPHIN) 2 g in sodium chloride 0.9 % 100 mL IVPB     2 g 200 mL/hr over 30 Minutes Intravenous Every 24 hours 09/16/19 1216 09/21/19 1259       Objective:   Vitals:   09/19/19 2111 09/20/19 0518 09/20/19 0835 09/20/19 1450  BP: 112/72 126/79  117/79  Pulse: 82 86  82  Resp: 19 19  16   Temp: 98 F (36.7 C) 98.2 F (36.8 C)  98.3 F (36.8 C)  TempSrc: Oral Oral    SpO2: 91% 96% 94% 100%  Weight:  110.8 kg    Height:        Wt Readings from Last 3 Encounters:  09/20/19 110.8 kg  09/10/19 (!) 142.9 kg  08/25/19 133.8 kg     Intake/Output Summary (Last 24 hours) at 09/20/2019 1853 Last data filed at 09/20/2019 1600 Gross per 24 hour  Intake 680 ml  Output --  Net 680 ml    Physical Exam  General exam: Alert, awake, oriented x 3 Respiratory system: Clear to auscultation. Respiratory effort normal. Cardiovascular system:RRR. No murmurs, rubs, gallops. Gastrointestinal system: Abdomen is distended, soft and nontender. No organomegaly or masses felt. Normal bowel sounds heard. Central nervous system: Alert and oriented. No focal neurological deficits. Extremities: 1+ edema bilaterally Skin: No rashes, lesions or ulcers Psychiatry: Judgement and insight appear  normal. Mood & affect appropriate.     Data Review:   Micro Results No results found for this or any previous visit (from the past 240 hour(s)). Radiology Reports DG Chest Port 1 View  Result Date: 09/10/2019 CLINICAL DATA:  Shortness of breath. EXAM: PORTABLE CHEST 1 VIEW COMPARISON:  August 25, 2019. FINDINGS: Stable cardiomediastinal silhouette. No pneumothorax is noted. Increased left basilar atelectasis or infiltrate is noted. Minimal right basilar subsegmental atelectasis or infiltrate is noted. Bony thorax is unremarkable. IMPRESSION: Increased bibasilar subsegmental atelectasis or infiltrates, left greater than right. Electronically Signed   By: Marijo Conception M.D.   On: 09/10/2019 10:14   DG  Chest Portable 1 View  Result Date: 08/25/2019 CLINICAL DATA:  Shortness of breath. Abdominal swelling and leg swelling. EXAM: PORTABLE CHEST 1 VIEW COMPARISON:  08/17/2019 FINDINGS: The heart size is within normal limits. Slight pulmonary vascular prominence. Minimal atelectasis at the left lung base. No pulmonary edema or visible pleural effusions. No bone abnormality. IMPRESSION: Slight pulmonary vascular prominence. Electronically Signed   By: Lorriane Shire M.D.   On: 08/25/2019 13:12   ECHOCARDIOGRAM COMPLETE  Result Date: 09/10/2019    ECHOCARDIOGRAM REPORT   Patient Name:   JALIL LORUSSO Date of Exam: 09/10/2019 Medical Rec #:  [id number]       Height:       69.0 in Accession #:    [id number]      Weight:       315.0 lb Date of Birth:  05/18/1976        BSA:          2.506 m Patient Age:    75 years        BP:           134/94 mmHg Patient Gender: M               HR:           92 bpm. Exam Location:  [facility] Procedure: 2D Echo Indications:    Dyspnea 786.09 / R06.00  History:        Patient has no prior history of Echocardiogram examinations.                 Risk Factors:Non-Smoker and Hypertension. Edema, Portal vein                 thrombosis, History of COVID 19.  Sonographer:    Leavy Cella RDCS (AE) Referring Phys: [phone] DAVID TAT IMPRESSIONS  1. Left ventricular ejection fraction, by estimation, is 60 to 65%. The left ventricle has normal function. The left ventricle has no regional wall motion abnormalities. Left ventricular diastolic parameters are indeterminate.  2. Right ventricular systolic function is normal. The right ventricular size is normal.  3. The mitral valve was not well visualized. No evidence of mitral valve regurgitation. No evidence of mitral stenosis.  4. The aortic valve was not well visualized. Aortic valve regurgitation is not visualized. No aortic stenosis is present. FINDINGS  Left Ventricle: Left ventricular ejection fraction, by estimation, is 60 to 65%. The left  ventricle has normal function. The left ventricle has no regional wall motion abnormalities. The left ventricular internal cavity size was normal in size. There is  no left ventricular hypertrophy. Left ventricular diastolic parameters are indeterminate. Right Ventricle: The right ventricular size is normal. No increase in right ventricular wall thickness. Right ventricular systolic function is normal. Left Atrium: Left atrial size was normal in size. Right Atrium:  Right atrial size was normal in size. Pericardium: There is no evidence of pericardial effusion. Mitral Valve: The mitral valve was not well visualized. No evidence of mitral valve regurgitation. No evidence of mitral valve stenosis. Tricuspid Valve: The tricuspid valve is normal in structure. Tricuspid valve regurgitation is not demonstrated. No evidence of tricuspid stenosis. Aortic Valve: The aortic valve was not well visualized. Aortic valve regurgitation is not visualized. No aortic stenosis is present. Aortic valve mean gradient measures 5.2 mmHg. Aortic valve peak gradient measures 9.3 mmHg. Aortic valve area, by VTI measures 2.81 cm. Pulmonic Valve: The pulmonic valve was not well visualized. Pulmonic valve regurgitation is not visualized. No evidence of pulmonic stenosis. Aorta: The aortic root was not well visualized. Pulmonary Artery: Indeterminate PASP, inadequate TR jet. Venous: The inferior vena cava was not well visualized. IAS/Shunts: The interatrial septum was not well visualized. Additional Comments: Mild ascites is present.  LEFT VENTRICLE PLAX 2D LVIDd:         4.96 cm  Diastology LVIDs:         3.02 cm  LV e' lateral:   14.80 cm/s LV PW:         0.90 cm  LV E/e' lateral: 3.8 LV IVS:        0.86 cm  LV e' medial:    8.16 cm/s LVOT diam:     2.30 cm  LV E/e' medial:  6.9 LV SV:         86 LV SV Index:   34 LVOT Area:     4.15 cm  RIGHT VENTRICLE RV S prime:     12.60 cm/s TAPSE (M-mode): 2.8 cm LEFT ATRIUM             Index LA diam:         4.30 cm 1.72 cm/m LA Vol (A2C):   49.2 ml 19.63 ml/m LA Vol (A4C):   38.5 ml 15.36 ml/m LA Biplane Vol: 47.3 ml 18.87 ml/m  AORTIC VALVE AV Area (Vmax):    2.62 cm AV Area (Vmean):   2.50 cm AV Area (VTI):     2.81 cm AV Vmax:           152.23 cm/s AV Vmean:          107.685 cm/s AV VTI:            0.307 m AV Peak Grad:      9.3 mmHg AV Mean Grad:      5.2 mmHg LVOT Vmax:         95.98 cm/s LVOT Vmean:        64.882 cm/s LVOT VTI:          0.208 m LVOT/AV VTI ratio: 0.68  AORTA Ao Root diam: 3.40 cm MITRAL VALVE MV Area (PHT): 3.81 cm    SHUNTS MV Decel Time: 199 msec    Systemic VTI:  0.21 m MV E velocity: 56.10 cm/s  Systemic Diam: 2.30 cm MV A velocity: 54.80 cm/s MV E/A ratio:  1.02 Carlyle Dolly MD Electronically signed by Carlyle Dolly MD Signature Date/Time: 09/10/2019/4:56:54 PM    Final    US LIVER DOPPLER  Result Date: 09/11/2019 CLINICAL DATA:  Cirrhosis, ascites, nonocclusive portal thrombus by CT EXAM: DUPLEX ULTRASOUND OF LIVER TECHNIQUE: Color and duplex Doppler ultrasound was performed to evaluate the hepatic in-flow and out-flow vessels. COMPARISON:  08/17/2019 CT FINDINGS: Liver: Increased echogenicity with surface nodularity compatible with hepatic cirrhosis. No focal lesion, mass or intrahepatic biliary ductal  dilatation. Main Portal Vein size: 2.7 cm Portal Vein Velocities Main Prox:  35 cm/sec Main Mid: 39 cm/sec Main Dist:  33 cm/sec Right: 26 cm/sec Left: 23 cm/sec Hepatic Vein Velocities Right:  40 cm/sec Middle:  27 cm/sec Left:  46 cm/sec IVC: Present and patent with normal respiratory phasicity. Hepatic Artery Velocity:  37 cm/sec Splenic Vein Velocity:  18 cm/sec Spleen: 18 cm x 5.5 cm x 22 cm with a total volume of 1163 cm^3 (411 cm^3 is upper limit normal) Portal Vein Occlusion/Thrombus: Nonocclusive hypoechoic thrombus along the main portal vein wall posteriorly extending into the right portal vein. This correlates with the CT. Splenic Vein Occlusion/Thrombus: No  Ascites: Moderate ascites present. Varices: None IMPRESSION: Nonocclusive portal vein thrombus within the main portal vein extending into the right portal vein. Hepatopetal flow is maintained. Hepatic cirrhosis and ascites.  Associated splenomegaly. Electronically Signed   By: Jerilynn Mages.  Shick M.D.   On: 09/11/2019 16:02     CBC Recent Labs  Lab 09/16/19 0700 09/17/19 0000 09/18/19 0536 09/19/19 0301 09/20/19 0739  WBC 4.6 4.1 3.5* 4.5 4.5  HGB 11.1* 9.4* 9.4* 8.9* 10.4*  HCT 35.4* 29.3* 29.2* 27.3* 32.8*  PLT 109* 99* 95* 96* 101*  MCV 92.9 92.7 93.0 91.9 92.7  MCH 29.1 29.7 29.9 30.0 29.4  MCHC 31.4 32.1 32.2 32.6 31.7  RDW 14.3 14.2 14.7 14.8 15.2    Chemistries  Recent Labs  Lab 09/14/19 0512 09/15/19 0448 09/17/19 0000 09/18/19 0536  NA 139 138 135 137  K 3.7 3.8 3.9 4.0  CL 101 101 102 100  CO2 30 30 27 26   GLUCOSE 85 88 95 88  BUN 11 15 17 15   CREATININE 0.72 0.76 0.68 0.72  CALCIUM 8.6* 8.3* 8.4* 9.1  AST  --  21  --  22  ALT  --  12  --  16  ALKPHOS  --  54  --  55  BILITOT  --  0.6  --  0.6   ------------------------------------------------------------------------------------------------------------------ No results for input(s): CHOL, HDL, LDLCALC, TRIG, CHOLHDL, LDLDIRECT in the last 72 hours.  No results found for: HGBA1C ------------------------------------------------------------------------------------------------------------------ No results for input(s): TSH, T4TOTAL, T3FREE, THYROIDAB in the last 72 hours.  Invalid input(s): FREET3 ------------------------------------------------------------------------------------------------------------------ No results for input(s): VITAMINB12, FOLATE, FERRITIN, TIBC, IRON, RETICCTPCT in the last 72 hours.  Coagulation profile No results for input(s): INR, PROTIME in the last 168 hours.  No results for input(s): DDIMER in the last 72 hours.  Cardiac Enzymes No results for input(s): CKMB, TROPONINI, MYOGLOBIN in  the last 168 hours.  Invalid input(s): CK ------------------------------------------------------------------------------------------------------------------    Component Value Date/Time   BNP 44.0 08/25/2019 1329    Kathie Dike M.D on 09/20/2019 at 6:53 PM  Go to [email or web address] - for contact info  [facility] - Office  [phone]

## 2019-09-21 LAB — CBC
HCT: 34.1 % — ABNORMAL LOW (ref 39.0–52.0)
Hemoglobin: 10.7 g/dL — ABNORMAL LOW (ref 13.0–17.0)
MCH: 29.1 pg (ref 26.0–34.0)
MCHC: 31.4 g/dL (ref 30.0–36.0)
MCV: 92.7 fL (ref 80.0–100.0)
Platelets: 120 10*3/uL — ABNORMAL LOW (ref 150–400)
RBC: 3.68 MIL/uL — ABNORMAL LOW (ref 4.22–5.81)
RDW: 15.4 % (ref 11.5–15.5)
WBC: 5.3 10*3/uL (ref 4.0–10.5)
nRBC: 0 % (ref 0.0–0.2)

## 2019-09-21 NOTE — Progress Notes (Signed)
Patient Demographics:    Alejandro Porter, is a 44 y.o. male, DOB - 1975/11/26, BJY:[id number]  Admit date - 09/10/2019   Admitting Physician Orson Eva, MD  Outpatient Primary MD for the patient is [facility]   Chief Complaint  Patient presents with  . Ascites        Subjective:    Alejandro Porter denies any abdominal pain.  No blood in stools.  No shortness of air.  Reports good urine output.   Assessment  & Plan :    Active Problems:   Cirrhosis of liver with ascites (HCC)   Portal vein thrombosis   Colorectal polyps   Cecal polyp   Acute liver failure without hepatic coma   Other ascites  Brief summary 44 y.o. 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 Child Pugh B. -s/p paracentesis on 09/10/2019 with removal of 8 L -Patient failed outpatient diuresis with oral agents, --- treated with IV Lasix with IV albumin and p.o. Aldactone -GI consult appreciated -he will need vaccination against hep B and hep A as outpatient -We will most likely need outpatient paracentesis from time to time  2)Portal vein thrombosis, nonocclusive, extending into right intrahepatic portal vein, first noted at time of CT  08/16/20---  EGD showed grade 1 and grade 2 esophageal varices without stigmata of bleeding -Discussed with Dr. Delton Coombes from hematology -GI service also recommends anticoagulation ---c/n IV heparin, plan to transition to p.o. Eliquis if no bleeding and if platelet counts are stable -Platelets were 185 on 08/25/2019,  -Platelets down to 90 K (platelet counts were already drifting down prior to initiation of IV heparin on 09/12/2019 Platelets 185 (08/25/19),  Platelets this admission- 111>>106>>>90>>101>>107>>95>>96 -Monitor platelets and H&H, monitor for bleeding -Risk versus benefit of full anticoagulation discussed with patient, his father and his cousin at bedside----they have decided to go ahead with full anticoagulation -Okay to transition from IV heparin to Eliquis on 09/20/2019 per GI service--- Eliquis will be given at maintenance dose without high-dose loading to increase bleeding risk -Reviewed with [facility] team and since patient does not have insurance, long-term Eliquis may not be feasible.  If there is no assistance program for him to get Eliquis after discharge, may need to consider starting on Coumadin.  3)H/o COVID 19 infection--diagnosed 08/25/2019, repeat Covid test negative on 09/10/2019 -Asymptomatic,   4)HTN--stopped metoprolol, continue propanolol due to moderate portal hypertension  5)Esophageal dysphagia secondary Schatzki's ring.  Patient underwent esophageal dilation on 09/12/2019--- tolerating oral intake well  6)Acute Gi Bleed- -Patient with heme positive stool in setting of esophageal varices and need for anticoagulation for portal vein thrombosis -Discussed with GI service  -Hemoglobin down to 8.9 from  11.1, now stable at 10.4 -Colonoscopy report from 09/18/2019 as noted above and below with polyps, internal and internal hemorrhoids and diverticulosis -EGD report from 09/12/2019 as noted below  -- 7) abdominal discomfort post paracentesis--- irritation around left anterior  abdominal wall noted ----IV Rocephin for SBP prophylaxis should cover for any possible infection in this area as well -Overall improving  Disposition/Need for in-Hospital Stay- patient unable to be discharged at this time due to ---decompensated liver cirrhosis requiring aggressive iv diuresis  -Patient From: Home D/C Place: Home Barriers: Not Clinically Stable- --Patient failed outpatient diuresis with oral agents, continueIV heparin - -Patient has been switched to Eliquis, and does not have evidence of recurrent bleeding -Reviewed with [facility] team and since patient does not have insurance, long-term Eliquis may not be feasible.  If there is no assistance program for him to get Eliquis after discharge, may need to consider starting on Coumadin.  This will need to be clarified prior to discharge  Code Status : Full code  Family Communication:   (patient is alert, awake and coherent) Cousin and father at bedside  Consults  :  GI/phone consult with Dr. Delton Coombes from hematology  Procedures:- 1) paracentesis on 09/10/2019 with removal of 8 L 2)EGD on 09/12/2019- Small hiatal hernia. Mild erosive reflux esophagitis. Schatzki's ring - status post dilation   - Small grade 1/grade 2 esophageal varices without  bleeding stigmata - Portal hypertensive gastropathy 3)--Colonoscopy on 09/18/2019 with internal and external hemorrhoids, diverticulosis, at least 12 polyps removed from the cecum, transverse colon, descending colon and sigmoid and rectum  DVT Prophylaxis  : apixiban  Lab Results  Component Value Date   PLT 120 (L) 09/21/2019   Inpatient Medications  Scheduled Meds: . apixaban  5 mg Oral BID  . furosemide  40 mg Intravenous BID  . gabapentin  300 mg Oral QHS  . lidocaine (PF)  5 mL Intradermal Once  . polyethylene glycol  17 g Oral Daily  . spironolactone  100 mg Oral BID WC   Continuous Infusions:  PRN Meds:.  Anti-infectives (From admission, onward)   Start     Dose/Rate  Route Frequency Ordered Stop   09/16/19 1300  cefTRIAXone (ROCEPHIN) 2 g in sodium chloride 0.9 % 100 mL IVPB     2 g 200 mL/hr over 30 Minutes Intravenous Every 24 hours 09/16/19 1216 09/21/19 1259       Objective:   Vitals:   09/20/19 2024 09/21/19 0338 09/21/19 0457 09/21/19 1644  BP:   112/81 113/85  Pulse:   81 76  Resp:   16 20  Temp:   98.1 F (36.7 C) 98.5 F (36.9 C)  TempSrc:   Oral Oral  SpO2: 96%  100% 100%  Weight:  108.8 kg    Height:        Wt Readings from Last 3 Encounters:  09/21/19 108.8 kg  09/10/19 (!) 142.9 kg  08/25/19 133.8 kg     Intake/Output Summary (Last 24 hours) at 09/21/2019 1914 Last data filed at 09/21/2019 1700 Gross per 24 hour  Intake 720 ml  Output --  Net 720 ml    Physical Exam  General exam: Alert, awake, oriented x 3 Respiratory system: Clear to auscultation. Respiratory effort normal. Cardiovascular system:RRR. No murmurs, rubs, gallops. Gastrointestinal system: Abdomen is distended, soft and nontender. No organomegaly or masses felt. Normal bowel sounds heard. Central nervous system: Alert and oriented. No focal neurological deficits. Extremities: 1+ pitting edema bilaterally  Skin: No rashes,  lesions or ulcers Psychiatry: Judgement and insight appear normal. Mood & affect appropriate.       Data Review:   Micro Results No results found for this or any previous visit (from the past 240 hour(s)). Radiology Reports DG Chest Port 1 View  Result Date: 09/10/2019 CLINICAL DATA:  Shortness of breath. EXAM: PORTABLE CHEST 1 VIEW COMPARISON:  August 25, 2019. FINDINGS: Stable cardiomediastinal silhouette. No pneumothorax is noted. Increased left basilar atelectasis or infiltrate is noted. Minimal right basilar subsegmental atelectasis or infiltrate is noted. Bony thorax is unremarkable. IMPRESSION: Increased bibasilar subsegmental atelectasis or infiltrates, left greater than right. Electronically Signed   By: Marijo Conception  M.D.   On: 09/10/2019 10:14   DG Chest Portable 1 View  Result Date: 08/25/2019 CLINICAL DATA:  Shortness of breath. Abdominal swelling and leg swelling. EXAM: PORTABLE CHEST 1 VIEW COMPARISON:  08/17/2019 FINDINGS: The heart size is within normal limits. Slight pulmonary vascular prominence. Minimal atelectasis at the left lung base. No pulmonary edema or visible pleural effusions. No bone abnormality. IMPRESSION: Slight pulmonary vascular prominence. Electronically Signed   By: Lorriane Shire M.D.   On: 08/25/2019 13:12   ECHOCARDIOGRAM COMPLETE  Result Date: 09/10/2019    ECHOCARDIOGRAM REPORT   Patient Name:   Alejandro Porter Date of Exam: 09/10/2019 Medical Rec #:  [id number]       Height:       69.0 in Accession #:    [id number]      Weight:       315.0 lb Date of Birth:  [birth year]        BSA:          2.506 m Patient Age:    17 years        BP:           134/94 mmHg Patient Gender: M               HR:           92 bpm. Exam Location:  [facility] Procedure: 2D Echo Indications:    Dyspnea 786.09 / R06.00  History:        Patient has no prior history of Echocardiogram examinations.                 Risk Factors:Non-Smoker and Hypertension. Edema, Portal vein                 thrombosis, History of COVID 19.  Sonographer:    Leavy Cella RDCS (AE) Referring Phys: [phone] DAVID TAT IMPRESSIONS  1. Left ventricular ejection fraction, by estimation, is 60 to 65%. The left ventricle has normal function. The left ventricle has no regional wall motion abnormalities. Left ventricular diastolic parameters are indeterminate.  2. Right ventricular systolic function is normal. The right ventricular size is normal.  3. The mitral valve was not well visualized. No evidence of mitral valve regurgitation. No evidence of mitral stenosis.  4. The aortic valve was not well visualized. Aortic valve regurgitation is not visualized. No aortic stenosis is present. FINDINGS  Left Ventricle: Left ventricular ejection fraction, by  estimation, is 60 to 65%. The left ventricle has normal function. The left ventricle has no regional wall motion abnormalities. The left ventricular internal cavity size was normal in size. There is  no left ventricular hypertrophy. Left ventricular diastolic parameters are indeterminate. Right Ventricle: The right ventricular size is normal. No increase in right ventricular wall thickness. Right ventricular systolic function is normal. Left  Atrium: Left atrial size was normal in size. Right Atrium: Right atrial size was normal in size. Pericardium: There is no evidence of pericardial effusion. Mitral Valve: The mitral valve was not well visualized. No evidence of mitral valve regurgitation. No evidence of mitral valve stenosis. Tricuspid Valve: The tricuspid valve is normal in structure. Tricuspid valve regurgitation is not demonstrated. No evidence of tricuspid stenosis. Aortic Valve: The aortic valve was not well visualized. Aortic valve regurgitation is not visualized. No aortic stenosis is present. Aortic valve mean gradient measures 5.2 mmHg. Aortic valve peak gradient measures 9.3 mmHg. Aortic valve area, by VTI measures 2.81 cm. Pulmonic Valve: The pulmonic valve was not well visualized. Pulmonic valve regurgitation is not visualized. No evidence of pulmonic stenosis. Aorta: The aortic root was not well visualized. Pulmonary Artery: Indeterminate PASP, inadequate TR jet. Venous: The inferior vena cava was not well visualized. IAS/Shunts: The interatrial septum was not well visualized. Additional Comments: Mild ascites is present.  LEFT VENTRICLE PLAX 2D LVIDd:         4.96 cm  Diastology LVIDs:         3.02 cm  LV e' lateral:   14.80 cm/s LV PW:         0.90 cm  LV E/e' lateral: 3.8 LV IVS:        0.86 cm  LV e' medial:    8.16 cm/s LVOT diam:     2.30 cm  LV E/e' medial:  6.9 LV SV:         86 LV SV Index:   34 LVOT Area:     4.15 cm  RIGHT VENTRICLE RV S prime:     12.60 cm/s TAPSE (M-mode): 2.8 cm LEFT  ATRIUM             Index LA diam:        4.30 cm 1.72 cm/m LA Vol (A2C):   49.2 ml 19.63 ml/m LA Vol (A4C):   38.5 ml 15.36 ml/m LA Biplane Vol: 47.3 ml 18.87 ml/m  AORTIC VALVE AV Area (Vmax):    2.62 cm AV Area (Vmean):   2.50 cm AV Area (VTI):     2.81 cm AV Vmax:           152.23 cm/s AV Vmean:          107.685 cm/s AV VTI:            0.307 m AV Peak Grad:      9.3 mmHg AV Mean Grad:      5.2 mmHg LVOT Vmax:         95.98 cm/s LVOT Vmean:        64.882 cm/s LVOT VTI:          0.208 m LVOT/AV VTI ratio: 0.68  AORTA Ao Root diam: 3.40 cm MITRAL VALVE MV Area (PHT): 3.81 cm    SHUNTS MV Decel Time: 199 msec    Systemic VTI:  0.21 m MV E velocity: 56.10 cm/s  Systemic Diam: 2.30 cm MV A velocity: 54.80 cm/s MV E/A ratio:  1.02 Carlyle Dolly MD Electronically signed by Carlyle Dolly MD Signature Date/Time: 09/10/2019/4:56:54 PM    Final    US LIVER DOPPLER  Result Date: 09/11/2019 CLINICAL DATA:  Cirrhosis, ascites, nonocclusive portal thrombus by CT EXAM: DUPLEX ULTRASOUND OF LIVER TECHNIQUE: Color and duplex Doppler ultrasound was performed to evaluate the hepatic in-flow and out-flow vessels. COMPARISON:  08/17/2019 CT FINDINGS: Liver: Increased echogenicity with surface nodularity compatible with  hepatic cirrhosis. No focal lesion, mass or intrahepatic biliary ductal dilatation. Main Portal Vein size: 2.7 cm Portal Vein Velocities Main Prox:  35 cm/sec Main Mid: 39 cm/sec Main Dist:  33 cm/sec Right: 26 cm/sec Left: 23 cm/sec Hepatic Vein Velocities Right:  40 cm/sec Middle:  27 cm/sec Left:  46 cm/sec IVC: Present and patent with normal respiratory phasicity. Hepatic Artery Velocity:  37 cm/sec Splenic Vein Velocity:  18 cm/sec Spleen: 18 cm x 5.5 cm x 22 cm with a total volume of 1163 cm^3 (411 cm^3 is upper limit normal) Portal Vein Occlusion/Thrombus: Nonocclusive hypoechoic thrombus along the main portal vein wall posteriorly extending into the right portal vein. This correlates with the CT.  Splenic Vein Occlusion/Thrombus: No Ascites: Moderate ascites present. Varices: None IMPRESSION: Nonocclusive portal vein thrombus within the main portal vein extending into the right portal vein. Hepatopetal flow is maintained. Hepatic cirrhosis and ascites.  Associated splenomegaly. Electronically Signed   By: Jerilynn Mages.  Shick M.D.   On: 09/11/2019 16:02     CBC Recent Labs  Lab 09/17/19 0000 09/18/19 0536 09/19/19 0301 09/20/19 0739 09/21/19 0802  WBC 4.1 3.5* 4.5 4.5 5.3  HGB 9.4* 9.4* 8.9* 10.4* 10.7*  HCT 29.3* 29.2* 27.3* 32.8* 34.1*  PLT 99* 95* 96* 101* 120*  MCV 92.7 93.0 91.9 92.7 92.7  MCH 29.7 29.9 30.0 29.4 29.1  MCHC 32.1 32.2 32.6 31.7 31.4  RDW 14.2 14.7 14.8 15.2 15.4    Chemistries  Recent Labs  Lab 09/15/19 0448 09/17/19 0000 09/18/19 0536  NA 138 135 137  K 3.8 3.9 4.0  CL 101 102 100  CO2 30 27 26   GLUCOSE 88 95 88  BUN 15 17 15   CREATININE 0.76 0.68 0.72  CALCIUM 8.3* 8.4* 9.1  AST 21  --  22  ALT 12  --  16  ALKPHOS 54  --  55  BILITOT 0.6  --  0.6   ------------------------------------------------------------------------------------------------------------------ No results for input(s): CHOL, HDL, LDLCALC, TRIG, CHOLHDL, LDLDIRECT in the last 72 hours.  No results found for: HGBA1C ------------------------------------------------------------------------------------------------------------------ No results for input(s): TSH, T4TOTAL, T3FREE, THYROIDAB in the last 72 hours.  Invalid input(s): FREET3 ------------------------------------------------------------------------------------------------------------------ No results for input(s): VITAMINB12, FOLATE, FERRITIN, TIBC, IRON, RETICCTPCT in the last 72 hours.  Coagulation profile No results for input(s): INR, PROTIME in the last 168 hours.  No results for input(s): DDIMER in the last 72 hours.  Cardiac Enzymes No results for input(s): CKMB, TROPONINI, MYOGLOBIN in the last 168  hours.  Invalid input(s): CK ------------------------------------------------------------------------------------------------------------------    Component Value Date/Time   BNP 44.0 08/25/2019 1329    Kathie Dike M.D on 09/21/2019 at 7:14 PM  Go to [email or web address] - for contact info  [facility] - Office  [phone]

## 2019-09-21 NOTE — Progress Notes (Addendum)
  Assessment/Plan: ADMITTED WITH ANASARCA/GI BLEED. NO BRBPR OR MELENA. BP/Hb STABLE.  PLAN: 1. CONTINUE ELIQUIS. 2. CONTINUE LASIX/ALDACTONE BID. 3. AVOID PROPRANOLOL. 4.  Will SIGN OFF. PLEASE CALL WITH QUESTIONS.   Subjective: Since I last evaluated the patient SWELLING HAS IMPROVED. No questions or concerns. NO BRBPR OR MELENA.  Objective: Vital signs in last 24 hours: Vitals:   09/20/19 2024 09/21/19 0457  BP:  112/81  Pulse:  81  Resp:  16  Temp:  98.1 F (36.7 C)  SpO2: 96% 100%   General appearance: alert, cooperative and no distress Resp: clear to auscultation bilaterally Cardio: regular rate and rhythm GI: soft, non-tender; bowel sounds normal; no masses, obese  Lab Results:  Hb 10.7 PLT CT 120   Studies/Results: No results found.  Medications: I have reviewed the patient's current medications.

## 2019-09-21 NOTE — TOC Initial Note (Signed)
Transition of Care [facility]) - Initial/Assessment Note    Patient Details  Name: Alejandro Porter MRN: [id number] Date of Birth: Oct 30, 1975  [facility]) CM/SW Contact:    Shade Flood, LCSW Phone Number: 09/21/2019, 1:42 PM  Clinical Narrative:                  Pt is approaching being stable for dc. He will need to be on Eliquis at dc for likely 3-6 months and he does not have medical insurance. Spoke with pt by phone today. Pt states that he obtains care at the [facility]. He states that they assist him with medications. TOC will follow up with the Health Department tomorrow when they re-open to inquire on whether or not they can provide the Eliquis. This needs to be confirmed before pt can be discharged. Pt will also need an appointment scheduled for follow up.  Pt states that he lives with his father who will be the person picking him up at dc. Pt needs the 30 day Eliquis coupon still. He does not anticipate any other TOC needs for dc.  Assigned TOC will follow up Monday.  Expected Discharge Plan: Home/Self Care Barriers to Discharge: Inadequate or no insurance   Patient Goals and CMS Choice        Expected Discharge Plan and Services Expected Discharge Plan: Home/Self Care In-house Referral: Clinical Social Work     Living arrangements for the past 2 months: Single Family Home                                      Prior Living Arrangements/Services Living arrangements for the past 2 months: Single Family Home Lives with:: Self Patient language and need for interpreter reviewed:: Yes        Need for Family Participation in Patient Care: No (Comment) Care giver support system in place?: Yes (comment)   Criminal Activity/Legal Involvement Pertinent to Current Situation/Hospitalization: No - Comment as needed  Activities of Daily Living Home Assistive Devices/Equipment: None ADL Screening (condition at time of  admission) Patient's cognitive ability adequate to safely complete daily activities?: Yes Is the patient deaf or have difficulty hearing?: No Does the patient have difficulty seeing, even when wearing glasses/contacts?: No Does the patient have difficulty concentrating, remembering, or making decisions?: No Patient able to express need for assistance with ADLs?: Yes Does the patient have difficulty dressing or bathing?: No Independently performs ADLs?: Yes (appropriate for developmental age) Does the patient have difficulty walking or climbing stairs?: No Weakness of Legs: None Weakness of Arms/Hands: None  Permission Sought/Granted                  Emotional Assessment       Orientation: : Oriented to Self, Oriented to Place, Oriented to  Time, Oriented to Situation Alcohol / Substance Use: Not Applicable Psych Involvement: No (comment)  Admission diagnosis:  Anasarca [R60.1] Other ascites [R18.8] Cirrhosis of liver with ascites (HCC) [K74.60, R18.8] Cirrhosis of liver with ascites, unspecified hepatic cirrhosis type ([place]) [K74.60, R18.8] Acute liver failure without hepatic coma [K72.00] Patient Active Problem List   Diagnosis Date Noted  . Acute liver failure without hepatic coma   . Other ascites   . Colorectal polyps   . Cecal polyp   . Hepatic cirrhosis ([place]) 09/10/2019  . Edema 09/10/2019  . Portal hypertension ([place]) 09/10/2019  . Cirrhosis of  liver with ascites ([place]) 09/10/2019  . 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  . Anasarca    PCP:  [facility] [place]:   [facility], [address] [place] [address] Phone: [phone] Fax: [phone]     Social Determinants of Health (SDOH) Interventions    Readmission Risk Interventions No flowsheet data found.

## 2019-09-22 ENCOUNTER — Telehealth: Payer: Self-pay | Admitting: Gastroenterology

## 2019-09-22 ENCOUNTER — Inpatient Hospital Stay (HOSPITAL_COMMUNITY): Payer: Medicaid Other

## 2019-09-22 ENCOUNTER — Encounter (HOSPITAL_COMMUNITY): Payer: Self-pay | Admitting: Internal Medicine

## 2019-09-22 LAB — BASIC METABOLIC PANEL
Anion gap: 11 (ref 5–15)
BUN: 16 mg/dL (ref 6–20)
CO2: 28 mmol/L (ref 22–32)
Calcium: 9.1 mg/dL (ref 8.9–10.3)
Chloride: 97 mmol/L — ABNORMAL LOW (ref 98–111)
Creatinine, Ser: 0.85 mg/dL (ref 0.61–1.24)
GFR calc Af Amer: >60 mL/min (ref >60)
GFR calc non Af Amer: >60 mL/min (ref >60)
Glucose, Bld: 86 mg/dL (ref 70–99)
Potassium: 3.8 mmol/L (ref 3.5–5.1)
Sodium: 136 mmol/L (ref 135–145)

## 2019-09-22 LAB — CBC
HCT: 35.2 % — ABNORMAL LOW (ref 39.0–52.0)
Hemoglobin: 11 g/dL — ABNORMAL LOW (ref 13.0–17.0)
MCH: 29.4 pg (ref 26.0–34.0)
MCHC: 31.3 g/dL (ref 30.0–36.0)
MCV: 94.1 fL (ref 80.0–100.0)
Platelets: 127 10*3/uL — ABNORMAL LOW (ref 150–400)
RBC: 3.74 MIL/uL — ABNORMAL LOW (ref 4.22–5.81)
RDW: 15.9 % — ABNORMAL HIGH (ref 11.5–15.5)
WBC: 5.7 10*3/uL (ref 4.0–10.5)
nRBC: 0 % (ref 0.0–0.2)

## 2019-09-22 LAB — SURGICAL PATHOLOGY

## 2019-09-22 MED ORDER — LACTULOSE 10 GM/15ML PO SOLN: 10.0000 g | Freq: Every day | ORAL | 2 refills | Status: DC

## 2019-09-22 MED ORDER — GABAPENTIN 300 MG PO CAPS: 300.0000 mg | ORAL_CAPSULE | Freq: Every day | ORAL | 6 refills | Status: DC

## 2019-09-22 MED ORDER — OMEPRAZOLE 20 MG PO CPDR: 20.0000 mg | DELAYED_RELEASE_CAPSULE | Freq: Every day | ORAL | 5 refills | Status: DC

## 2019-09-22 MED ORDER — SPIRONOLACTONE 100 MG PO TABS: 100.0000 mg | ORAL_TABLET | Freq: Two times a day (BID) | ORAL | 2 refills | Status: DC

## 2019-09-22 MED ORDER — IRON 325 (65 FE) MG PO TABS: 1.0000 | ORAL_TABLET | Freq: Every day | ORAL | 0 refills | Status: DC

## 2019-09-22 MED ORDER — APIXABAN 5 MG PO TABS: 5.0000 mg | ORAL_TABLET | Freq: Two times a day (BID) | ORAL | 5 refills | Status: DC

## 2019-09-22 MED ORDER — FUROSEMIDE 40 MG PO TABS: 40.0000 mg | ORAL_TABLET | Freq: Two times a day (BID) | ORAL | 3 refills | Status: DC

## 2019-09-22 MED ORDER — PROPRANOLOL HCL 20 MG PO TABS: 20.0000 mg | ORAL_TABLET | Freq: Two times a day (BID) | ORAL | 5 refills | Status: DC

## 2019-09-22 NOTE — TOC Progression Note (Addendum)
Transition of Care [facility]) - Progression Note    Patient Details  Name: Jarious Lyon MRN: [id number] Date of Birth: 1975/11/22  Transition of Care [facility]) CM/SW Contact  Ihor Gully, LCSW Phone Number: 09/22/2019, 2:38 PM  Clinical Narrative:    Spoke with [facility] Department regarding patient's hospital discharge appointment and need for Eliquis program assistance program. Appointment scheduled for Tuesday, 09/30/19, at 10:00 a.m., appointment is noted that patient will need Eliquis prescription assistance program. Advised that patient will need proof of income.  Patient advised of appointment and appointment placed on AVS. Advised to bring proof of income. Patient verbalized understanding.  Patient provided Eliquis voucher by pharmacy for 30 days free.  TOC signing off.   Expected Discharge Plan: Home/Self Care Barriers to Discharge: Inadequate or no insurance  Expected Discharge Plan and Services Expected Discharge Plan: Home/Self Care In-house Referral: Clinical Social Work     Living arrangements for the past 2 months: Single Family Home Expected Discharge Date: 09/22/19                                     Social Determinants of Health (SDOH) Interventions    Readmission Risk Interventions No flowsheet data found.

## 2019-09-22 NOTE — Progress Notes (Signed)
Paracentesis complete no signs of distress.

## 2019-09-22 NOTE — Discharge Instructions (Signed)
--1)Spoke with [facility] Department regarding patient's hospital discharge appointment and need for Eliquis program assistance program. Appointment scheduled for Tuesday, 09/30/19, at 10:00 a.m., appointment is noted that patient will need Eliquis prescription assistance program. Advised that patient will need proof of income.  Patient advised of appointment and appointment placed on AVS. Advised to bring proof of income. Patient verbalized understanding.  Patient provided Eliquis voucher by pharmacy for 30 days free.   2)You are taking Eliquis/Apixaban Avoid ibuprofen/Advil/Aleve/Motrin/Goody Powders/Naproxen/BC powders/Meloxicam/Diclofenac/Indomethacin and other Nonsteroidal anti-inflammatory medications as these will make you more likely to bleed and can cause stomach ulcers, can also cause Kidney problems.   3)Very low-salt diet advised 4)Weigh yourself daily, call if you gain more than 3 pounds in 1 day or more than 5 pounds in 1 week as your diuretic medications may need to be adjusted 5)Limit your Fluid  intake to no more than 60 ounces (1.8 Liters) per day 6)Follow up with Dr. Gala Romney (Gastroeneterology)-- in about 1 week for recheck and re-valuation --will need weekly cbc and CMP every Friday starting 09/26/19 7) you may need repeat paracentesis/removal of fluid from the abdomen from time to time the GI physician can help to arrange this for you   Information on my medicine - ELIQUIS (apixaban)  This medication education was reviewed with me or my healthcare representative as part of my discharge preparation.   Why was Eliquis prescribed for you? 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  What do You need to know about Eliquis ? Take your Eliquis TWICE DAILY - one tablet in the morning and one tablet in the  evening with or without food. If you have difficulty swallowing the tablet whole please discuss with your pharmacist how to take the medication safely.  Try to take the dose about the same time in the morning and in the evening. If you have difficulty swallowing the tablet whole please discuss with your pharmacist how to take the medication safely.  Take Eliquis exactly as prescribed and DO NOT stop taking Eliquis without talking to the doctor who prescribed the medication.  Stopping may increase your risk of developing a new blood clot.  Refill your prescription before you run out.  After discharge, you should have regular check-up appointments with your healthcare provider that is prescribing your Eliquis.    What do you do if you miss a dose? If a dose of ELIQUIS is not taken at the scheduled time, take it as soon as possible on the same day and twice-daily administration should be resumed. The dose should not be doubled to make up for a missed dose.  Important Safety Information A possible side effect of Eliquis is bleeding. You should call your healthcare provider right away if you experience any of the following: ? Bleeding from an injury or your nose that does not stop. ? Unusual colored urine (red or dark brown) or unusual colored stools (red or black). ? Unusual bruising for unknown reasons. ? A serious fall or if you hit your head (even if there is no bleeding).  Some medicines may interact with Eliquis and might increase your risk of bleeding or clotting while on Eliquis. To help avoid this, consult your healthcare provider or pharmacist prior to using any new prescription or non-prescription medications, including herbals, vitamins, non-steroidal anti-inflammatory drugs (  NSAIDs) and supplements.  This website has more information on Eliquis (apixaban): http://www.eliquis.com/eliquis/home   -- 1)Spoke with [facility] Department regarding patient's hospital  discharge appointment and need for Eliquis program assistance program. Appointment scheduled for Tuesday, 09/30/19, at 10:00 a.m., appointment is noted that patient will need Eliquis prescription assistance program. Advised that patient will need proof of income.  Patient advised of appointment and appointment placed on AVS. Advised to bring proof of income. Patient verbalized understanding.  Patient provided Eliquis voucher by pharmacy for 30 days free.   2)You are taking Eliquis/Apixaban Avoid ibuprofen/Advil/Aleve/Motrin/Goody Powders/Naproxen/BC powders/Meloxicam/Diclofenac/Indomethacin and other Nonsteroidal anti-inflammatory medications as these will make you more likely to bleed and can cause stomach ulcers, can also cause Kidney problems.   3)Very low-salt diet advised 4)Weigh yourself daily, call if you gain more than 3 pounds in 1 day or more than 5 pounds in 1 week as your diuretic medications may need to be adjusted 5)Limit your Fluid  intake to no more than 60 ounces (1.8 Liters) per day 6)Follow up with Dr. Gala Romney (Gastroeneterology)-- in about 1 week for recheck and re-valuation --will need weekly cbc and CMP every Friday starting 09/26/19 7) you may need repeat paracentesis/removal of fluid from the abdomen from time to time the GI physician can help to arrange this for you

## 2019-09-22 NOTE — Discharge Summary (Signed)
Alejandro Porter, is a 44 y.o. male  DOB 10/31/1975  MRN [id number].  Admission date:  09/10/2019  Admitting Physician  Orson Eva, MD  Discharge Date:  09/22/2019   Primary MD  Health, [facility]  Recommendations for primary care physician for things to follow:   1)Spoke with [facility] Department regarding patient's hospital discharge appointment and need for Eliquis program assistance program. Appointment scheduled for Tuesday, 09/30/19, at 10:00 a.m., appointment is noted that patient will need Eliquis prescription assistance program. Advised that patient will need proof of income.  Patient advised of appointment and appointment placed on AVS. Advised to bring proof of income. Patient verbalized understanding.  Patient provided Eliquis voucher by pharmacy for 30 days free.   2)You are taking Eliquis/Apixaban Avoid ibuprofen/Advil/Aleve/Motrin/Goody Powders/Naproxen/BC powders/Meloxicam/Diclofenac/Indomethacin and other Nonsteroidal anti-inflammatory medications as these will make you more likely to bleed and can cause stomach ulcers, can also cause Kidney problems.   3)Very low-salt diet advised 4)Weigh yourself daily, call if you gain more than 3 pounds in 1 day or more than 5 pounds in 1 week as your diuretic medications may need to be adjusted 5)Limit your Fluid  intake to no more than 60 ounces (1.8 Liters) per day 6)Follow up with Dr. Gala Romney (Gastroeneterology)-- in about 1 week for recheck and re-valuation --will need weekly cbc and CMP every Friday starting 09/26/19 7) you may need repeat paracentesis/removal of fluid from the abdomen from time to time the GI physician can help to arrange this for you  Admission Diagnosis  Anasarca [R60.1] Other ascites [R18.8] Cirrhosis of liver with ascites ([place]) [K74.60, R18.8] Cirrhosis of liver with ascites, unspecified hepatic cirrhosis  type ([place]) [K74.60, R18.8] Acute liver failure without hepatic coma [K72.00]   Discharge Diagnosis  Anasarca [R60.1] Other ascites [R18.8] Cirrhosis of liver with ascites ([place]) [K74.60, R18.8] Cirrhosis of liver with ascites, unspecified hepatic cirrhosis type ([place]) [K74.60, R18.8] Acute liver failure without hepatic coma [K72.00]    Active Problems:   Cirrhosis of liver with ascites ([place])   Portal vein thrombosis   Colorectal polyps   Cecal polyp   Acute liver failure without hepatic coma   Other ascites      Past Medical History:  Diagnosis Date  . Hypertension   . Liver cirrhosis ([place])   . Neuropathy     Past Surgical History:  Procedure Laterality Date  . BIOPSY  09/12/2019   Procedure: BIOPSY;  Surgeon: Daneil Dolin, MD;  [facility];  Service: Endoscopy;;  . COLONOSCOPY N/A 09/18/2019   Procedure: COLONOSCOPY;  Surgeon: Danie Binder, MD;  [facility];  Service: Endoscopy;  Laterality: N/A;  . ESOPHAGEAL BANDING N/A 09/12/2019   Procedure: ESOPHAGEAL BANDING;  Surgeon: Daneil Dolin, MD;  [facility];  Service: Endoscopy;  Laterality: N/A;  . ESOPHAGOGASTRODUODENOSCOPY N/A 09/12/2019   Procedure: ESOPHAGOGASTRODUODENOSCOPY (EGD);  Surgeon: Daneil Dolin, MD;  [facility];  Service: Endoscopy;  Laterality: N/A;  . POLYPECTOMY  09/18/2019  Procedure: POLYPECTOMY;  Surgeon: Danie Binder, MD;  [facility];  Service: Endoscopy;;     HPI  from the history and physical done on the day of admission:    HPI:  Alejandro Porter is a 44 y.o. male with medical history of hypertension and recently diagnosed liver cirrhosis presenting with worsening lower extremity edema and increasing abdominal girth.  The patient states that he has been struggling with leg edema and abdominal edema for at least 2 months.  He had been going to the health department and [facility] and he has been receiving furosemide 40 mg twice  daily since February 2021.  Unfortunately, he continued to have dyspnea on exertion and worsening edema.  He initially went to [facility] emergency department.  CT of the abdomen and pelvis was performed at that time it showed cirrhosis with portal hypertension with splenomegaly and a large amount of ascites.  There is nonocclusive thrombus in his portal vein.  In addition there was prominent to mildly enlarged periceliac and gastrohepatic lymph nodes.  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  The patient was instructed to follow-up at the [facility].  Unfortunately, he continued to have edema and swelling.  He presented to the emergency department at [facility] on 08/25/2019.  He was told to increase his furosemide to 40 mg 3 times daily.  He continued to have dyspnea on exertion and orthopnea type symptoms to the point where he has had to sleep in the recliner.  He saw gastroenterology on the morning of 09/10/2019.  Because of concerns for decompensated liver cirrhosis and continued edema and shortness of breath, the patient was sent to the emergency department for further evaluation.  The patient himself denies any fevers, chills, chest pain, nausea, vomiting, diarrhea, abdominal pain, dysuria, hematuria.  In the emergency department, the patient was afebrile and hemodynamically stable with oxygen saturation 96% on room air.  His x-ray showed bibasilar atelectasis, left greater than right.   Hospital Course:    Brief summary 44 y.o.malewith medical history ofhypertension and recently diagnosed liver cirrhosis presenting with worsening lower extremity edema and increasing abdominal girth admitted from GI clinic on 09/10/2019 after failing outpatient oral diuretics for anasarca and worsening ascites -Patient with heme positive stool in setting of esophageal varices and need for anticoagulation for portal vein thrombosis -Colonoscopy on 09/18/2019 with internal and  external hemorrhoids, diverticulosis, at least 12 polyps removed from the cecum, transverse colon, descending colon and sigmoid and rectum -  A/p 1)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 Child Pugh B. -s/p paracentesis on 09/10/2019 with removal of 8 L -Patient failed outpatient diuresis with oral agents, --- treated with IV Lasix with IV albumin and p.o. Aldactone -GI consult appreciated -he will need vaccination against hep B and hep A as outpatient -We will most likely need outpatient paracentesis from time to time  2)Portal vein thrombosis, nonocclusive, extending into right intrahepatic portal vein, first noted at time of CT 08/16/20---  EGD showed grade 1 and grade 2 esophageal varices without stigmata of bleeding -Discussed with Dr. Delton Coombes from hematology -GI service also recommends anticoagulation --- Treated with IV heparin, -Discharge on p.o. Eliquis  -Platelets were 185 on 08/25/2019,  -Platelets down to 90 K (platelet counts were already drifting down prior to initiation of IV heparin on 09/12/2019 Platelets 185 (08/25/19),  Platelets this admission- 111>>106>>>90>>101>>107>>95>>96>>> 127 -Risk versus  benefit of full anticoagulation discussed with patient, his father and his cousin at bedside----they have decided to go ahead with full anticoagulation -Okay to transition from IV heparin to Eliquis on 09/20/2019 per GI service--- Eliquis will be given at maintenance dose without high-dose loading to increase bleeding risk -Reviewed with [facility] team and since patient does not have insurance, may need to consider starting on Coumadin. -Spoke with [facility] Department regarding patient's hospital discharge appointment and need for Eliquis program assistance program. Appointment scheduled for Tuesday, 09/30/19, at 10:00 a.m., appointment is noted that patient will need Eliquis  prescription assistance program. Advised that patient will need proof of income.  Patient advised of appointment and appointment placed on AVS. Advised to bring proof of income. Patient verbalized understanding.  Patient provided Eliquis voucher by pharmacy for 30 days free.    3)H/o COVID 19 infection--diagnosed 08/25/2019, repeat Covid test negative on 09/10/2019 -Asymptomatic,   4)HTN--stopped metoprolol, continue propanolol due to moderate portal hypertension  5)Esophageal dysphagia secondary Schatzki's ring. Patient underwent esophageal dilation on 09/12/2019--- tolerating oral intake well  6)Acute Gi Bleed- -Patient with heme positive stool in setting of esophageal varices and need for anticoagulation for portal vein thrombosis -Discussed with GI service  -Hemoglobin down to 8.9 from 11.1, now stable at 11.0 -Colonoscopy report from 09/18/2019 as noted above and below with polyps, internal and internal hemorrhoids and diverticulosis -EGD report from 09/12/2019 as noted below  -- 7) abdominal discomfort post paracentesis--- irritation around left anterior abdominal wall noted ----patient was treated with IV Rocephin for SBP prophylaxis should cover for any possible infection in this area as well -Overall much improved  Disposition-discharge home -Patient From: Home D/C Place: Home  Code Status : Full code  Family Communication:   (patient is alert, awake and coherent) Cousin and father at bedside  Consults  :  GI/phone consult with Dr. Delton Coombes from hematology  Procedures:- 1) paracentesis on 09/10/2019 with removal of 8 L 2)EGD on 09/12/2019- Small hiatal hernia. Mild erosive reflux esophagitis. Schatzki's ring - status post dilation - Small grade 1/grade 2 esophageal varices without bleeding stigmata -Portal hypertensive gastropathy 3)--Colonoscopy on 09/18/2019 with internal and external hemorrhoids, diverticulosis, at least 12 polyps removed from the cecum,  transverse colon, descending colon and sigmoid and rectum  Discharge Condition: stable  Follow UP  Follow-up Information    Health, [facility] Follow up in 8 day(s).   Why: Appointment on Tuesday, 09/30/2019, at 10:00 a.m. wiht Dr. Marella Bile. PLEASE REMIND THEM THAT YOU NEED TO BE ENROLLED IN A PRESCRIPTION ASSISTANCE PROGRAM FOR ELIQUIS. PLEASE BRING ANY PROOF OF INCOME TO APPOINTMENT FOR PRESCRIPTION ASSISTANCE.  Contact information: [address] [place] [address] [phone]        Daneil Dolin, MD. Schedule an appointment as soon as possible for a visit on 09/26/2019.   Specialty: Gastroenterology Why: Follow up with Dr. Gala Romney (Gastroeneterology)-- in about 1 week for recheck and re-valuation --will need weekly cbc and CMP every Friday starting 09/26/19 Contact information: [place] [address] [phone]            Diet and Activity recommendation:  As advised  Discharge Instructions    Discharge Instructions    Call MD for:  difficulty breathing, headache or visual disturbances   Complete by: As directed    Call MD for:  extreme fatigue   Complete by: As directed    Call MD for:  persistant dizziness or light-headedness  Complete by: As directed    Call MD for:  persistant nausea and vomiting   Complete by: As directed    Call MD for:  severe uncontrolled pain   Complete by: As directed    Call MD for:  temperature >100.4   Complete by: As directed    Diet - low sodium heart healthy   Complete by: As directed    Discharge instructions   Complete by: As directed    1)Spoke with [facility] Department regarding patient's hospital discharge appointment and need for Eliquis program assistance program. Appointment scheduled for Tuesday, 09/30/19, at 10:00 a.m., appointment is noted that patient will need Eliquis prescription assistance program. Advised that patient will need proof of income.  Patient advised  of appointment and appointment placed on AVS. Advised to bring proof of income. Patient verbalized understanding.  Patient provided Eliquis voucher by pharmacy for 30 days free.   2)You are taking Eliquis/Apixaban Avoid ibuprofen/Advil/Aleve/Motrin/Goody Powders/Naproxen/BC powders/Meloxicam/Diclofenac/Indomethacin and other Nonsteroidal anti-inflammatory medications as these will make you more likely to bleed and can cause stomach ulcers, can also cause Kidney problems.   3)Very low-salt diet advised 4)Weigh yourself daily, call if you gain more than 3 pounds in 1 day or more than 5 pounds in 1 week as your diuretic medications may need to be adjusted 5)Limit your Fluid  intake to no more than 60 ounces (1.8 Liters) per day 6)Follow up with Dr. Gala Romney (Gastroeneterology)-- in about 1 week for recheck and re-valuation --will need weekly cbc and CMP every Friday starting 09/26/19 7) you may need repeat paracentesis/removal of fluid from the abdomen from time to time the GI physician can help to arrange this for you   Increase activity slowly   Complete by: As directed         Discharge Medications     Allergies as of 09/22/2019   No Known Allergies     Medication List    STOP taking these medications   metoprolol succinate 50 MG 24 hr tablet Commonly known as: TOPROL-XL   Potassium 99 MG Tabs     TAKE these medications   apixaban 5 MG Tabs tablet Commonly known as: ELIQUIS Take 1 tablet (5 mg total) by mouth 2 (two) times daily.   furosemide 40 MG tablet Commonly known as: LASIX Take 1 tablet (40 mg total) by mouth 2 (two) times daily. What changed:   when to take this  reasons to take this   gabapentin 300 MG capsule Commonly known as: NEURONTIN Take 1 capsule (300 mg total) by mouth at bedtime.   Iron 325 (65 Fe) MG Tabs Take 1 tablet (325 mg total) by mouth daily.   lactulose 10 GM/15ML solution Commonly known as: CHRONULAC Take 15 mLs (10 g total) by mouth  daily.   multivitamin tablet Take 1 tablet by mouth daily.   omeprazole 20 MG capsule Commonly known as: PriLOSEC Take 1 capsule (20 mg total) by mouth daily.   propranolol 20 MG tablet Commonly known as: INDERAL Take 1 tablet (20 mg total) by mouth 2 (two) times daily.   spironolactone 100 MG tablet Commonly known as: ALDACTONE Take 1 tablet (100 mg total) by mouth 2 (two) times daily with a meal.   Vitamin D3 125 MCG (5000 UT) Tabs Take 1 tablet by mouth daily.       Major procedures and Radiology Reports - PLEASE review detailed and final reports for all details, in brief -   US Paracentesis  Result Date: 09/22/2019 INDICATION: 44 year old male with history of large volume of ascites. EXAM: ULTRASOUND GUIDED  PARACENTESIS MEDICATIONS: None. COMPLICATIONS: None. PROCEDURE: Informed written consent was obtained from the patient after a discussion of the risks, benefits and alternatives to treatment. A timeout was performed prior to the initiation of the procedure. Initial ultrasound scanning demonstrates a large amount of ascites within the right lower abdominal quadrant. The right lower abdomen was prepped and draped in the usual sterile fashion. 1% lidocaine was used for local anesthesia. Following this, a Yueh centesis catheter was introduced. An ultrasound image was saved for documentation purposes. The paracentesis was performed. The catheter was removed and a dressing was applied. The patient tolerated the procedure well without immediate post procedural complication. Patient received post-procedure intravenous albumin; see nursing notes for details. FINDINGS: A total of approximately 4 L of serous fluid was removed. Samples were sent to the laboratory as requested by the clinical team. IMPRESSION: Successful ultrasound-guided paracentesis yielding 4 liters of peritoneal fluid. Electronically Signed   By: Vinnie Langton M.D.   On: 09/22/2019 14:40   DG Chest Port 1  View  Result Date: 09/10/2019 CLINICAL DATA:  Shortness of breath. EXAM: PORTABLE CHEST 1 VIEW COMPARISON:  August 25, 2019. FINDINGS: Stable cardiomediastinal silhouette. No pneumothorax is noted. Increased left basilar atelectasis or infiltrate is noted. Minimal right basilar subsegmental atelectasis or infiltrate is noted. Bony thorax is unremarkable. IMPRESSION: Increased bibasilar subsegmental atelectasis or infiltrates, left greater than right. Electronically Signed   By: Marijo Conception M.D.   On: 09/10/2019 10:14   DG Chest Portable 1 View  Result Date: 08/25/2019 CLINICAL DATA:  Shortness of breath. Abdominal swelling and leg swelling. EXAM: PORTABLE CHEST 1 VIEW COMPARISON:  08/17/2019 FINDINGS: The heart size is within normal limits. Slight pulmonary vascular prominence. Minimal atelectasis at the left lung base. No pulmonary edema or visible pleural effusions. No bone abnormality. IMPRESSION: Slight pulmonary vascular prominence. Electronically Signed   By: Lorriane Shire M.D.   On: 08/25/2019 13:12   ECHOCARDIOGRAM COMPLETE  Result Date: 09/10/2019    ECHOCARDIOGRAM REPORT   Patient Name:   Alejandro Porter Date of Exam: 09/10/2019 Medical Rec #:  [id number]       Height:       69.0 in Accession #:    [id number]      Weight:       315.0 lb Date of Birth:  26-Mar-1976        BSA:          2.506 m Patient Age:    59 years        BP:           134/94 mmHg Patient Gender: M               HR:           92 bpm. Exam Location:  [facility] Procedure: 2D Echo Indications:    Dyspnea 786.09 / R06.00  History:        Patient has no prior history of Echocardiogram examinations.                 Risk Factors:Non-Smoker and Hypertension. Edema, Portal vein                 thrombosis, History of COVID 19.  Sonographer:    Leavy Cella RDCS (AE) Referring Phys: [phone] DAVID TAT IMPRESSIONS  1. Left ventricular ejection fraction, by estimation, is 60 to 65%. The left ventricle  has normal function. The left ventricle  has no regional wall motion abnormalities. Left ventricular diastolic parameters are indeterminate.  2. Right ventricular systolic function is normal. The right ventricular size is normal.  3. The mitral valve was not well visualized. No evidence of mitral valve regurgitation. No evidence of mitral stenosis.  4. The aortic valve was not well visualized. Aortic valve regurgitation is not visualized. No aortic stenosis is present. FINDINGS  Left Ventricle: Left ventricular ejection fraction, by estimation, is 60 to 65%. The left ventricle has normal function. The left ventricle has no regional wall motion abnormalities. The left ventricular internal cavity size was normal in size. There is  no left ventricular hypertrophy. Left ventricular diastolic parameters are indeterminate. Right Ventricle: The right ventricular size is normal. No increase in right ventricular wall thickness. Right ventricular systolic function is normal. Left Atrium: Left atrial size was normal in size. Right Atrium: Right atrial size was normal in size. Pericardium: There is no evidence of pericardial effusion. Mitral Valve: The mitral valve was not well visualized. No evidence of mitral valve regurgitation. No evidence of mitral valve stenosis. Tricuspid Valve: The tricuspid valve is normal in structure. Tricuspid valve regurgitation is not demonstrated. No evidence of tricuspid stenosis. Aortic Valve: The aortic valve was not well visualized. Aortic valve regurgitation is not visualized. No aortic stenosis is present. Aortic valve mean gradient measures 5.2 mmHg. Aortic valve peak gradient measures 9.3 mmHg. Aortic valve area, by VTI measures 2.81 cm. Pulmonic Valve: The pulmonic valve was not well visualized. Pulmonic valve regurgitation is not visualized. No evidence of pulmonic stenosis. Aorta: The aortic root was not well visualized. Pulmonary Artery: Indeterminate PASP, inadequate TR jet. Venous: The inferior vena cava was not well  visualized. IAS/Shunts: The interatrial septum was not well visualized. Additional Comments: Mild ascites is present.  LEFT VENTRICLE PLAX 2D LVIDd:         4.96 cm  Diastology LVIDs:         3.02 cm  LV e' lateral:   14.80 cm/s LV PW:         0.90 cm  LV E/e' lateral: 3.8 LV IVS:        0.86 cm  LV e' medial:    8.16 cm/s LVOT diam:     2.30 cm  LV E/e' medial:  6.9 LV SV:         86 LV SV Index:   34 LVOT Area:     4.15 cm  RIGHT VENTRICLE RV S prime:     12.60 cm/s TAPSE (M-mode): 2.8 cm LEFT ATRIUM             Index LA diam:        4.30 cm 1.72 cm/m LA Vol (A2C):   49.2 ml 19.63 ml/m LA Vol (A4C):   38.5 ml 15.36 ml/m LA Biplane Vol: 47.3 ml 18.87 ml/m  AORTIC VALVE AV Area (Vmax):    2.62 cm AV Area (Vmean):   2.50 cm AV Area (VTI):     2.81 cm AV Vmax:           152.23 cm/s AV Vmean:          107.685 cm/s AV VTI:            0.307 m AV Peak Grad:      9.3 mmHg AV Mean Grad:      5.2 mmHg LVOT Vmax:         95.98 cm/s LVOT Vmean:  64.882 cm/s LVOT VTI:          0.208 m LVOT/AV VTI ratio: 0.68  AORTA Ao Root diam: 3.40 cm MITRAL VALVE MV Area (PHT): 3.81 cm    SHUNTS MV Decel Time: 199 msec    Systemic VTI:  0.21 m MV E velocity: 56.10 cm/s  Systemic Diam: 2.30 cm MV A velocity: 54.80 cm/s MV E/A ratio:  1.02 Carlyle Dolly MD Electronically signed by Carlyle Dolly MD Signature Date/Time: 09/10/2019/4:56:54 PM    Final    US LIVER DOPPLER  Result Date: 09/11/2019 CLINICAL DATA:  Cirrhosis, ascites, nonocclusive portal thrombus by CT EXAM: DUPLEX ULTRASOUND OF LIVER TECHNIQUE: Color and duplex Doppler ultrasound was performed to evaluate the hepatic in-flow and out-flow vessels. COMPARISON:  08/17/2019 CT FINDINGS: Liver: Increased echogenicity with surface nodularity compatible with hepatic cirrhosis. No focal lesion, mass or intrahepatic biliary ductal dilatation. Main Portal Vein size: 2.7 cm Portal Vein Velocities Main Prox:  35 cm/sec Main Mid: 39 cm/sec Main Dist:  33 cm/sec Right: 26  cm/sec Left: 23 cm/sec Hepatic Vein Velocities Right:  40 cm/sec Middle:  27 cm/sec Left:  46 cm/sec IVC: Present and patent with normal respiratory phasicity. Hepatic Artery Velocity:  37 cm/sec Splenic Vein Velocity:  18 cm/sec Spleen: 18 cm x 5.5 cm x 22 cm with a total volume of 1163 cm^3 (411 cm^3 is upper limit normal) Portal Vein Occlusion/Thrombus: Nonocclusive hypoechoic thrombus along the main portal vein wall posteriorly extending into the right portal vein. This correlates with the CT. Splenic Vein Occlusion/Thrombus: No Ascites: Moderate ascites present. Varices: None IMPRESSION: Nonocclusive portal vein thrombus within the main portal vein extending into the right portal vein. Hepatopetal flow is maintained. Hepatic cirrhosis and ascites.  Associated splenomegaly. Electronically Signed   By: Jerilynn Mages.  Shick M.D.   On: 09/11/2019 16:02    Micro Results   No results found for this or any previous visit (from the past 240 hour(s)).   Today   Subjective    Alejandro Porter today has no new complaints      -Eating and drinking well, no further GI bleed concerns -Plan of care reviewed with patient counseling on diet at bedside   Patient has been seen and examined prior to discharge   Objective   Blood pressure 122/81, pulse 80, temperature 99.1 F (37.3 C), resp. rate 20, height 5' 9"  (1.753 m), weight 109 kg, SpO2 98 %.   Intake/Output Summary (Last 24 hours) at 09/22/2019 1634 Last data filed at 09/22/2019 1100 Gross per 24 hour  Intake 720 ml  Output --  Net 720 ml   Exam Gen:- Awake Alert, chronically ill-appearing  HEENT:- [place].AT,   Neck-Supple Neck,No JVD,.  Lungs-  CTAB , fair symmetrical air movement CV- S1, S2 normal, regular  Abd-diminished bowel sounds, no significant tenderness, ascites with fluid thrill noted, overall much improved Lt anterior abd wall erythema, tenderness and induration at prior paracentesis site  Extremity/Skin:-Much improved, 1+ pitting edema,  pedal pulses present  Psych-affect is appropriate, oriented x3 Neuro-generalized weakness, no new focal deficits, no tremors   Data Review   CBC w Diff:  Lab Results  Component Value Date   WBC 5.7 09/22/2019   HGB 11.0 (L) 09/22/2019   HCT 35.2 (L) 09/22/2019   PLT 127 (L) 09/22/2019   LYMPHOPCT 8 09/10/2019   MONOPCT 10 09/10/2019   EOSPCT 3 09/10/2019   BASOPCT 0 09/10/2019    CMP:  Lab Results  Component Value Date  NA 136 09/22/2019   K 3.8 09/22/2019   CL 97 (L) 09/22/2019   CO2 28 09/22/2019   BUN 16 09/22/2019   CREATININE 0.85 09/22/2019   PROT 6.7 09/18/2019   ALBUMIN 3.1 (L) 09/18/2019   BILITOT 0.6 09/18/2019   ALKPHOS 55 09/18/2019   AST 22 09/18/2019   ALT 16 09/18/2019  .   Total Discharge time is about 33 minutes  Roxan Hockey M.D on 09/22/2019 at 4:34 PM  Go to [email or web address] -  for contact info  [facility] - Office  [phone]

## 2019-09-22 NOTE — Telephone Encounter (Signed)
PT NEEDS OPV IN 4-5 WEEKS W/ RGA, Dx: CIRRHOSIS/ANASARCA.

## 2019-09-23 ENCOUNTER — Telehealth: Payer: Self-pay | Admitting: Internal Medicine

## 2019-09-23 NOTE — Telephone Encounter (Signed)
Please call patient, has a question about labs [phone], was admitted in the hospital prior and they want him to repeat the labs he had while inpatient, can we order that?

## 2019-09-23 NOTE — Telephone Encounter (Signed)
APPT WAS MADE FOR PT TO SEE PROVIDER ON 5/5 for hospital f/u

## 2019-09-30 ENCOUNTER — Telehealth: Payer: Self-pay | Admitting: Internal Medicine

## 2019-09-30 NOTE — Telephone Encounter (Signed)
PATIENT RETURNED CALL, PLEASE CALL WHEN YOU CAN

## 2019-09-30 NOTE — Telephone Encounter (Signed)
Notified pt of results/ see lab results note

## 2019-10-08 ENCOUNTER — Other Ambulatory Visit: Payer: Self-pay

## 2019-10-08 ENCOUNTER — Ambulatory Visit (INDEPENDENT_AMBULATORY_CARE_PROVIDER_SITE_OTHER): Payer: Medicaid Other | Admitting: Nurse Practitioner

## 2019-10-08 ENCOUNTER — Encounter: Payer: Self-pay | Admitting: Nurse Practitioner

## 2019-10-08 VITALS — BP 117/76 | HR 66 | Temp 97.0°F | Ht 68.0 in | Wt 212.6 lb

## 2019-10-08 DIAGNOSIS — R188 Other ascites: Secondary | ICD-10-CM

## 2019-10-08 DIAGNOSIS — I81 Portal vein thrombosis: Secondary | ICD-10-CM

## 2019-10-08 DIAGNOSIS — K766 Portal hypertension: Secondary | ICD-10-CM

## 2019-10-08 DIAGNOSIS — K7460 Unspecified cirrhosis of liver: Secondary | ICD-10-CM | POA: Diagnosis not present

## 2019-10-08 DIAGNOSIS — R609 Edema, unspecified: Secondary | ICD-10-CM

## 2019-10-08 DIAGNOSIS — D696 Thrombocytopenia, unspecified: Secondary | ICD-10-CM

## 2019-10-08 NOTE — Assessment & Plan Note (Signed)
Noted portal hypertension with splenomegaly, thrombocytopenia, grade 1/grade 2 esophageal varices.  Also partial, nonocclusive portal venous thrombus.  Previous significant ascites which is now much improved as per HPI and exam.  Essentially no lower extremity edema at this time.  He is making great efforts for low-salt diet and I am referring him to a dietitian for further education.  Last platelet count 90,000.  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  He is on beta-blockade with heart rate essentially at or near goal at 66 bpm today.  He does have some very brief 1 to 2-second orthostatic hypotension when he stands and I discussed this is normal.  Gave recommendations to help prevent worsening.  He is to call us if he does have worsening.  Follow-up in 3 months otherwise.

## 2019-10-08 NOTE — Assessment & Plan Note (Signed)
Noted portal venous thrombus.  Patient was started on heparin while hospitalized.  EGD and colonoscopy were completed for any high risk features that could result in bleeding complication.  None were really found so hematology was consulted who recommended starting the patient on Eliquis, with future decisions to be made based on platelet count (around 90,000 at discharge).  He does not have a follow-up with hematology and I will refer him for follow-up for management of Eliquis and PVT with background thrombocytopenia.  Recommended he call us for any worsening bleeding or significant bleeding that he notices.  Fall precautions were given as well.  Follow-up in 3 months.

## 2019-10-08 NOTE — Progress Notes (Signed)
amb  

## 2019-10-08 NOTE — Progress Notes (Signed)
Referring Provider: Health, Andre Lefort* Primary Care Physician:  Health, [facility] Primary GI:  Dr. Gala Romney  Chief Complaint  Patient presents with  . Cirrhosis    f/u.   Marland Kitchen HFU    feeling much better    HPI:   Alejandro Porter is a 44 y.o. male who presents for hospital follow-up.  The patient was seen in our office 09/10/2019 for cirrhosis, edema, portal hypertension.  Per original referral notes the patient was in the emergency department at [facility] and told he has cirrhosis based on CT as well as ascites and needs to see a specialist.  CT 08/17/2019 found cirrhosis with portal venous hypertension with splenomegaly, large amount of ascites.  Also noted nonocclusive thrombus in the portal vein.  Prominent to mildly enlarged periceliac and gastrohepatic ligament lymph nodes most likely reactive.  He presented to the [facility] emergency room 08/25/2019 for worsening edema and abdominal distention for which his Lasix was increased to 40 mg 3 times daily without improvement.  Noted shortness of breath, positive distention and fluid wave on exam.  Bedside ultrasound with large volume ascites and lab/imaging consistent with anasarca or volume overload.  He was discharged home and recommended to follow-up with GI.  At his last visit he noted swelling 4 weeks prior it is gotten progressively worse.  He is on diuretics without improvement.  Nocturnal abdominal pain when he lays flat, although he cannot any further needs to lie in a recliner.  On iron due to anemia and hypokalemia at the health department.  Apparent 75 pound weight gain over the past 4 weeks.  No alcohol or drug history.  COVID-19 positive in the ER and negative on 08/19/2019.  Intermittently on 6 7 Tylenol a day over several years.  On exam it was noted he was significantly dyspneic even at rest, severe distention, fluid wave, 3+ pitting edema bilaterally.  Noted tense ascites.  Recommended he proceed to the  emergency department for likely admission.  The patient ended up admitted to the hospital for 12/23/2019 through 09/22/2019.  Noted heme positive stool in the setting of esophageal varices and need for anticoagulation for PVT.  Overall felt the patient had decompensated liver cirrhosis likely due to [facility] with negative viral hepatitis panel.  Status post paracentesis on 09/10/2019 with 8 L removed.  He was treated with a series of IV Lasix and IV albumin as well as p.o. at Ald[facility].  Needs hepatitis a and B vaccination as an outpatient.  Likely need for occasional outpatient abdominal paracentesis.  Hematology consulted and recommended Eliquis as long as platelets are stable (most recently 90K).  He was initially on heparin as an inpatient and did well so he was transitioned to outpatient DOAC.  He did have a drop in hemoglobin from 11.1-8.9, although this improved to 10.4 by discharge.  Colonoscopy completed with polyps, internal and external hemorrhoids and diverticulosis with the polyp found to be tubular adenoma. EGD also completed which found small hiatal hernia, mild erosive reflux esophagitis, Schatzki's ring status post dilation.  Noted small grade 1/grade 2 esophageal varices without bleeding, portal hypertensive gastropathy.  Gastric erosions status post biopsy with a single gastric polyp status post removal.  Normal duodenum.  Surgical pathology found the gastric biopsies to be chronic inactive gastritis, mild in the polyp to be hyperplastic.  Recommended daily PPI and hematology consult.  Overall liver imaging and labs are up-to-date.  Needs hepatitis viral vaccines.  EGD screening up-to-date.  Today he states he's doing much better since hospital d/c. His weight has been stable 210-220 lb, he weighs himself daily. He knows to call for any worsening edema or 5-10 lb weight gain. He doesn't have an appointment with hematology. Rare upper abdominal pain that is brief and mild. Denies N/V, hematochezia,  melena. Does have "a little darker" stools on iron. Thinks he has some constipation and will go a couple days without a bowel movement and is wanting something to help. Denies fever, chills, unintentional weight loss. He does have some mild, intermittent dizziness that is brief, typically upon standing (orthostatic). He is trying to exercise more. Is trying to eat a low salt diet. Is purchasing no salt added drinks and foods. He uses Mrs. Dash for seasoning. Eating popcorn without adding salt. His birthday is on [birth year] and is going out to eat for dinner, trying to plan what to eat. Denies URI or flu-like symptoms. Denies loss of sense of taste or smell. He has not gotten a COVID-19 vaccine. He is not interested in the information for the vaccine.  A total of minimum 45 mins was personally spent on this patient's care today.  Past Medical History:  Diagnosis Date  . Hypertension   . Liver cirrhosis ([place])   . Neuropathy     Past Surgical History:  Procedure Laterality Date  . BIOPSY  09/12/2019   Procedure: BIOPSY;  Surgeon: Daneil Dolin, MD;  [facility];  Service: Endoscopy;;  . COLONOSCOPY N/A 09/18/2019   Procedure: COLONOSCOPY;  Surgeon: Danie Binder, MD;  [facility];  Service: Endoscopy;  Laterality: N/A;  . ESOPHAGEAL BANDING N/A 09/12/2019   Procedure: ESOPHAGEAL BANDING;  Surgeon: Daneil Dolin, MD;  [facility];  Service: Endoscopy;  Laterality: N/A;  . ESOPHAGOGASTRODUODENOSCOPY N/A 09/12/2019   Procedure: ESOPHAGOGASTRODUODENOSCOPY (EGD);  Surgeon: Daneil Dolin, MD;  [facility];  Service: Endoscopy;  Laterality: N/A;  . POLYPECTOMY  09/18/2019   Procedure: POLYPECTOMY;  Surgeon: Danie Binder, MD;  [facility];  Service: Endoscopy;;    Current Outpatient Medications  Medication Sig Dispense Refill  . apixaban (ELIQUIS) 5 MG TABS tablet Take 1 tablet (5 mg total) by mouth 2 (two) times daily. 60 tablet 5  .  Cholecalciferol (VITAMIN D3) 125 MCG (5000 UT) TABS Take 1 tablet by mouth daily.    . Ferrous Sulfate (IRON) 325 (65 Fe) MG TABS Take 1 tablet (325 mg total) by mouth daily. 30 tablet 0  . furosemide (LASIX) 40 MG tablet Take 1 tablet (40 mg total) by mouth 2 (two) times daily. 60 tablet 3  . gabapentin (NEURONTIN) 300 MG capsule Take 1 capsule (300 mg total) by mouth at bedtime. 30 capsule 6  . lactulose (CHRONULAC) 10 GM/15ML solution Take 15 mLs (10 g total) by mouth daily. 473 mL 2  . Multiple Vitamin (MULTIVITAMIN) tablet Take 1 tablet by mouth daily.    Marland Kitchen omeprazole (PRILOSEC) 20 MG capsule Take 1 capsule (20 mg total) by mouth daily. 30 capsule 5  . propranolol (INDERAL) 20 MG tablet Take 1 tablet (20 mg total) by mouth 2 (two) times daily. 60 tablet 5  . spironolactone (ALDACTONE) 100 MG tablet Take 1 tablet (100 mg total) by mouth 2 (two) times daily with a meal. 60 tablet 2   No current facility-administered medications for this visit.    Allergies as of 10/08/2019  . (No Known Allergies)    Family  History  Problem Relation Age of Onset  . Liver disease Neg Hx     Social History   Socioeconomic History  . Marital status: Single    Spouse name: Not on file  . Number of children: Not on file  . Years of education: Not on file  . Highest education level: Not on file  Occupational History  . Not on file  Tobacco Use  . Smoking status: Never Smoker  . Smokeless tobacco: Never Used  Substance and Sexual Activity  . Alcohol use: Never  . Drug use: Never  . Sexual activity: Not on file  Other Topics Concern  . Not on file  Social History Narrative  . Not on file   Social Determinants of Health   Financial Resource Strain:   . Difficulty of Paying Living Expenses:   Food Insecurity:   . Worried About Charity fundraiser in the Last Year:   . Arboriculturist in the Last Year:   Transportation Needs:   . Film/video editor (Medical):   Marland Kitchen Lack of Transportation  (Non-Medical):   Physical Activity:   . Days of Exercise per Week:   . Minutes of Exercise per Session:   Stress:   . Feeling of Stress :   Social Connections:   . Frequency of Communication with Friends and Family:   . Frequency of Social Gatherings with Friends and Family:   . Attends Religious Services:   . Active Member of Clubs or Organizations:   . Attends Archivist Meetings:   Marland Kitchen Marital Status:     Subjective: Review of Systems  Constitutional: Negative for chills, fever, malaise/fatigue and weight loss.  HENT: Negative for congestion and sore throat.   Respiratory: Negative for cough and shortness of breath.   Cardiovascular: Negative for chest pain and palpitations.  Gastrointestinal: Positive for constipation. Negative for abdominal pain, blood in stool, diarrhea, melena, nausea and vomiting.  Musculoskeletal: Negative for joint pain and myalgias.  Skin: Negative for rash.  Neurological: Positive for dizziness (Mild/brief orthostatic hypotension). Negative for weakness.  Endo/Heme/Allergies: Does not bruise/bleed easily.  Psychiatric/Behavioral: Negative for depression. The patient is not nervous/anxious.   All other systems reviewed and are negative.    Objective: BP 117/76   Pulse 66   Temp (!) 97 F (36.1 C) (Oral)   Ht 5' 8"  (1.727 m)   Wt 212 lb 9.6 oz (96.4 kg)   BMI 32.33 kg/m  Physical Exam Vitals and nursing note reviewed.  Constitutional:      General: He is not in acute distress.    Appearance: Normal appearance. He is not ill-appearing, toxic-appearing or diaphoretic.  HENT:     Head: Normocephalic and atraumatic.     Nose: No congestion or rhinorrhea.  Eyes:     General: No scleral icterus. Cardiovascular:     Rate and Rhythm: Normal rate and regular rhythm.     Heart sounds: Normal heart sounds.  Pulmonary:     Effort: Pulmonary effort is normal.     Breath sounds: Normal breath sounds.  Abdominal:     General: Bowel sounds  are normal. There is no distension.     Palpations: Abdomen is soft. There is no hepatomegaly, splenomegaly or mass.     Tenderness: There is no abdominal tenderness. There is no guarding or rebound.     Hernia: No hernia is present.  Musculoskeletal:     Cervical back: Neck supple.  Skin:  General: Skin is warm and dry.     Coloration: Skin is not jaundiced.     Findings: No bruising or rash.  Neurological:     General: No focal deficit present.     Mental Status: He is alert and oriented to person, place, and time. Mental status is at baseline.  Psychiatric:        Mood and Affect: Mood normal.        Behavior: Behavior normal.        Thought Content: Thought content normal.       10/08/2019 8:10 AM   Disclaimer: This note was dictated with voice recognition software. Similar sounding words can inadvertently be transcribed and may not be corrected upon review.

## 2019-10-08 NOTE — Patient Instructions (Signed)
Your health issues we discussed today were:   Cirrhosis: 1. I am glad you are doing and feeling so much better excavation point 2. Continue to eat a low-salt diet.  I will refer you to a dietitian to help with meal planning and low-salt options 3. You should obtain a hepatitis A/B vaccine series ("Twinrix ").  I have given you a prescription for this that you can take to the health department 4. Your labs and imaging are up-to-date 5. Call us if your dizziness gets any worse  Ascites (a lot of fluid due to liver disease): 1. I am glad your fluid is doing much better! 2. Continue taking your fluid pills (Lasix and Aldactone) 3. Call us if you see any worsening swelling or notice a 5 to 10 pound weight gain  Blood thinner for a clot in your liver: 1. I am referring you to hematology so they can manage your blood thinner 2. Call us if you notice any obvious bleeding  Overall I recommend:  1. Continue your other current medications 2. Return for follow-up in 3 months 3. Call us if you have any questions or concerns   At [facility] we value your feedback. You may receive a survey about your visit today. Please share your experience as we strive to create trusting relationships with our patients to provide genuine, compassionate, quality care.  We appreciate your understanding and patience as we review any laboratory studies, imaging, and other diagnostic tests that are ordered as we care for you. Our office policy is 5 business days for review of these results, and any emergent or urgent results are addressed in a timely manner for your best interest. If you do not hear from our office in 1 week, please contact us.   We also encourage the use of MyChart, which contains your medical information for your review as well. If you are not enrolled in this feature, an access code is on this after visit summary for your convenience. Thank you for allowing [place] to be involved in your  care.  It was great to see you today!  I hope you have a great Summer and [place]!!!

## 2019-10-08 NOTE — Assessment & Plan Note (Signed)
Recently diagnosed cirrhosis.  Initial office visit with [place] with severe ascites and anasarca requiring hospitalization for approximately 1-1/2 weeks.  His labs and imaging are up-to-date.  He is on anticoagulant for portal venous thrombus.  His ascites is significantly improved.  He is needing hepatitis a and B vaccine series and I have provided a prescription for him to obtain this through the health department.  EGD completed during admission which found grade 1/grade 2 esophageal varices and he is on beta-blockade with heart rate near goal at 66 with this.  Most recent meld score based on labs during hospitalization are MELD: 8, Child-Pugh" A (with caveat of no ascites today).  Recommend he follow-up with [place] in 3 months to keep a close eye on him until we can get him into every 74-monthfollow-up.  His compliance appears to be quite well and I think he is likely to follow-up with routine care and, hopefully, a good outlook.

## 2019-10-08 NOTE — Assessment & Plan Note (Signed)
Previous significant ascites with an approximate 60 pound weight gain in the 4 weeks prior to his hospital admission.  On admission he was 260 pounds.  Today he is 210 pounds.  He checks his weight every day and is within the same 10 pound range (210 to 220, measured morning and night).  He has no significant ascites today.  No lower extremity pitting edema.  He is taking his diuretics as recommended.  He is following a low-salt diet as per HPI.  I am referring him to a dietitian for further education related to low-salt diet.  Follow-up in 3 months.  Call for any worsening edema, 5 to 10 pound weight gain, etc. if this occurs.

## 2019-10-17 ENCOUNTER — Other Ambulatory Visit: Payer: Self-pay

## 2019-10-17 ENCOUNTER — Encounter (HOSPITAL_COMMUNITY): Payer: Self-pay | Admitting: *Deleted

## 2019-10-20 ENCOUNTER — Encounter (HOSPITAL_COMMUNITY): Payer: Self-pay | Admitting: *Deleted

## 2019-10-20 ENCOUNTER — Other Ambulatory Visit: Payer: Self-pay

## 2019-10-20 ENCOUNTER — Encounter (HOSPITAL_COMMUNITY): Payer: Self-pay | Admitting: Hematology

## 2019-10-20 ENCOUNTER — Inpatient Hospital Stay (HOSPITAL_COMMUNITY): Payer: Medicaid Other

## 2019-10-20 ENCOUNTER — Inpatient Hospital Stay (HOSPITAL_COMMUNITY): Payer: Medicaid Other | Attending: Hematology | Admitting: Hematology

## 2019-10-20 VITALS — BP 103/70 | HR 67 | Temp 99.0°F | Resp 18 | Ht 68.0 in | Wt 217.0 lb

## 2019-10-20 DIAGNOSIS — Z7901 Long term (current) use of anticoagulants: Secondary | ICD-10-CM

## 2019-10-20 DIAGNOSIS — I82439 Acute embolism and thrombosis of unspecified popliteal vein: Secondary | ICD-10-CM | POA: Diagnosis not present

## 2019-10-20 DIAGNOSIS — D696 Thrombocytopenia, unspecified: Secondary | ICD-10-CM | POA: Insufficient documentation

## 2019-10-20 DIAGNOSIS — E875 Hyperkalemia: Secondary | ICD-10-CM | POA: Diagnosis not present

## 2019-10-20 DIAGNOSIS — I10 Essential (primary) hypertension: Secondary | ICD-10-CM | POA: Diagnosis not present

## 2019-10-20 DIAGNOSIS — K7460 Unspecified cirrhosis of liver: Secondary | ICD-10-CM | POA: Insufficient documentation

## 2019-10-20 DIAGNOSIS — R188 Other ascites: Secondary | ICD-10-CM | POA: Diagnosis not present

## 2019-10-20 LAB — CBC WITH DIFFERENTIAL/PLATELET
Abs Immature Granulocytes: 0.1 10*3/uL — ABNORMAL HIGH (ref 0.00–0.07)
Basophils Absolute: 0 10*3/uL (ref 0.0–0.1)
Basophils Relative: 0 %
Eosinophils Absolute: 0.1 10*3/uL (ref 0.0–0.5)
Eosinophils Relative: 1 %
HCT: 39.2 % (ref 39.0–52.0)
Hemoglobin: 12.8 g/dL — ABNORMAL LOW (ref 13.0–17.0)
Immature Granulocytes: 1 %
Lymphocytes Relative: 5 %
Lymphs Abs: 0.7 10*3/uL (ref 0.7–4.0)
MCH: 29.4 pg (ref 26.0–34.0)
MCHC: 32.7 g/dL (ref 30.0–36.0)
MCV: 89.9 fL (ref 80.0–100.0)
Monocytes Absolute: 1.3 10*3/uL — ABNORMAL HIGH (ref 0.1–1.0)
Monocytes Relative: 10 %
Neutro Abs: 11.8 10*3/uL — ABNORMAL HIGH (ref 1.7–7.7)
Neutrophils Relative %: 83 %
Platelets: 323 10*3/uL (ref 150–400)
RBC: 4.36 MIL/uL (ref 4.22–5.81)
RDW: 15.2 % (ref 11.5–15.5)
WBC: 14.1 10*3/uL — ABNORMAL HIGH (ref 4.0–10.5)
nRBC: 0 % (ref 0.0–0.2)

## 2019-10-20 LAB — IRON AND TIBC
Iron: 30 ug/dL — ABNORMAL LOW (ref 45–182)
Saturation Ratios: 10 % — ABNORMAL LOW (ref 17.9–39.5)
TIBC: 289 ug/dL (ref 250–450)
UIBC: 259 ug/dL

## 2019-10-20 LAB — COMPREHENSIVE METABOLIC PANEL
ALT: 23 U/L (ref 0–44)
AST: 23 U/L (ref 15–41)
Albumin: 3.1 g/dL — ABNORMAL LOW (ref 3.5–5.0)
Alkaline Phosphatase: 100 U/L (ref 38–126)
Anion gap: 10 (ref 5–15)
BUN: 40 mg/dL — ABNORMAL HIGH (ref 6–20)
CO2: 24 mmol/L (ref 22–32)
Calcium: 8.8 mg/dL — ABNORMAL LOW (ref 8.9–10.3)
Chloride: 90 mmol/L — ABNORMAL LOW (ref 98–111)
Creatinine, Ser: 1.64 mg/dL — ABNORMAL HIGH (ref 0.61–1.24)
GFR calc Af Amer: 58 mL/min — ABNORMAL LOW (ref >60)
GFR calc non Af Amer: 50 mL/min — ABNORMAL LOW (ref >60)
Glucose, Bld: 110 mg/dL — ABNORMAL HIGH (ref 70–99)
Potassium: 5.5 mmol/L — ABNORMAL HIGH (ref 3.5–5.1)
Sodium: 124 mmol/L — ABNORMAL LOW (ref 135–145)
Total Bilirubin: 0.8 mg/dL (ref 0.3–1.2)
Total Protein: 8.3 g/dL — ABNORMAL HIGH (ref 6.5–8.1)

## 2019-10-20 LAB — HEPATITIS B SURFACE ANTIBODY,QUALITATIVE: Hep B S Ab: NONREACTIVE

## 2019-10-20 LAB — HEPATITIS B CORE ANTIBODY, TOTAL: Hep B Core Total Ab: NONREACTIVE

## 2019-10-20 LAB — LACTATE DEHYDROGENASE: LDH: 103 U/L (ref 98–192)

## 2019-10-20 LAB — FERRITIN: Ferritin: 233 ng/mL (ref 24–336)

## 2019-10-20 LAB — SEDIMENTATION RATE: Sed Rate: 57 mm/hr — ABNORMAL HIGH (ref 0–16)

## 2019-10-20 LAB — VITAMIN B12: Vitamin B-12: 440 pg/mL (ref 180–914)

## 2019-10-20 LAB — FOLATE: Folate: 26.5 ng/mL (ref >5.9)

## 2019-10-20 MED ORDER — SODIUM POLYSTYRENE SULFONATE PO POWD: Freq: Once | ORAL | 0 refills | Status: AC

## 2019-10-20 NOTE — Patient Instructions (Signed)
[place] at [facility] Discharge Instructions  You were seen today by Dr. Delton Coombes. He went over your history, family history and how you've been feeling lately. You will have blood drawn today before you leave the hospital. He will see you back in 2 weeks for follow up.   Thank you for choosing [place] at [facility] to provide your oncology and hematology care.  To afford each patient quality time with our provider, please arrive at least 15 minutes before your scheduled appointment time.   If you have a lab appointment with the [facility] please come in thru the  Main Entrance and check in at the main information desk  You need to re-schedule your appointment should you arrive 10 or more minutes late.  We strive to give you quality time with our providers, and arriving late affects you and other patients whose appointments are after yours.  Also, if you no show three or more times for appointments you may be dismissed from the clinic at the providers discretion.     Again, thank you for choosing [facility].  Our hope is that these requests will decrease the amount of time that you wait before being seen by our physicians.       _____________________________________________________________  Should you have questions after your visit to [facility], please contact our office at [phone] between the hours of 8:00 a.m. and 4:30 p.m.  Voicemails left after 4:00 p.m. will not be returned until the following business day.  For prescription refill requests, have your pharmacy contact our office and allow 72 hours.    Cancer Center Support Programs:   > Cancer Support Group  2nd Tuesday of the month 1pm-2pm, Journey Room

## 2019-10-20 NOTE — Progress Notes (Signed)
I tried calling patient today per Dr. Delton Coombes to inform about his potassium elevation on today's lab results.  He wasn't home. His dad confirmed that the health department is the pharmacy that they use. I have asked that he get Mr. Schueler to return my call so that we can discuss the medication and lab results.

## 2019-10-20 NOTE — Progress Notes (Signed)
CONSULT NOTE  Patient Care Team: [facility] as PCP - General Rourk, Cristopher Estimable, MD as Consulting Physician (Gastroenterology) Derek Jack, MD as Consulting Physician (Hematology)  CHIEF COMPLAINTS/PURPOSE OF CONSULTATION:  Thrombocytopenia and portal vein thrombosis.  HISTORY OF PRESENTING ILLNESS:  Alejandro Porter 44 y.o. male is seen in consultation today for further work-up and management of thrombocytopenia and portal vein thrombus.  He reported 67-monthhistory of abdominal distention and swelling of the feet.  [facility]  A CT scan of the abdomen and pelvis showed liver morphology consistent with cirrhosis with no focal lesions.  Enlarged spleen measuring 22 cm.  3.5 cm left adrenal mass consistent with adenoma.  Nonocclusive thrombus located in the portal vein present.  Prominent to mildly enlarged periceliac and gastrohepatic ligament lymph nodes, most likely reactive.  He was then evaluated by GI.  Repeat Doppler on 09/11/2019 showed nonocclusive thrombus along the main portal vein wall posteriorly extending into the right portal vein.  He was started on Eliquis.  He is tolerating it very well.  He is also taking fluid pills with Lasix and spironolactone.  Reports appetite is good.  He is taking low-sodium diet.  Since the fluid pills were started and diet change, he lost significant weight.  Denies any fevers or night sweats.  Denies any easy bruising or bleeding.  Appetite is 100%.  Energy levels are 50%.  Reports taking gabapentin for his tooth pain and also chest pain across the sternum.  Reports constipation at times.  He did yard work previously.  Denies any smoking history or alcohol use.  Family history with no significant cirrhosis.  Maternal grandmother had brain tumor.  Mother had brain tumor.  Maternal aunt had lung cancer.  MEDICAL HISTORY:  Past Medical History:  Diagnosis Date  . Hypertension   . Liver  cirrhosis ([place]   . Neuropathy     SURGICAL HISTORY: Past Surgical History:  Procedure Laterality Date  . BIOPSY  09/12/2019   Procedure: BIOPSY;  Surgeon: RDaneil Dolin MD;  [facility];  Service: Endoscopy;;  . COLONOSCOPY N/A 09/18/2019   Procedure: COLONOSCOPY;  Surgeon: FDanie Binder MD;  [facility];  Service: Endoscopy;  Laterality: N/A;  . ESOPHAGEAL BANDING N/A 09/12/2019   Procedure: ESOPHAGEAL BANDING;  Surgeon: RDaneil Dolin MD;  [facility];  Service: Endoscopy;  Laterality: N/A;  . ESOPHAGOGASTRODUODENOSCOPY N/A 09/12/2019   Procedure: ESOPHAGOGASTRODUODENOSCOPY (EGD);  Surgeon: RDaneil Dolin MD;  [facility];  Service: Endoscopy;  Laterality: N/A;  . POLYPECTOMY  09/18/2019   Procedure: POLYPECTOMY;  Surgeon: FDanie Binder MD;  [facility];  Service: Endoscopy;;    SOCIAL HISTORY: Social History   Socioeconomic History  . Marital status: Single    Spouse name: Not on file  . Number of children: Not on file  . Years of education: Not on file  . Highest education level: Not on file  Occupational History  . Occupation: umemployed  Tobacco Use  . Smoking status: Never Smoker  . Smokeless tobacco: Never Used  Substance and Sexual Activity  . Alcohol use: Never  . Drug use: Never  . Sexual activity: Not on file  Other Topics Concern  . Not on file  Social History Narrative  . Not on file   Social Determinants of Health   Financial Resource Strain:   . Difficulty of Paying Living Expenses:  Food Insecurity:   . Worried About Charity fundraiser in the Last Year:   . Arboriculturist in the Last Year:   Transportation Needs:   . Film/video editor (Medical):   Marland Kitchen Lack of Transportation (Non-Medical):   Physical Activity:   . Days of Exercise per Week:   . Minutes of Exercise per Session:   Stress:   . Feeling of Stress :   Social Connections:   . Frequency of Communication with  Friends and Family:   . Frequency of Social Gatherings with Friends and Family:   . Attends Religious Services:   . Active Member of Clubs or Organizations:   . Attends Archivist Meetings:   Marland Kitchen Marital Status:   Intimate Partner Violence:   . Fear of Current or Ex-Partner:   . Emotionally Abused:   Marland Kitchen Physically Abused:   . Sexually Abused:     FAMILY HISTORY: Family History  Problem Relation Age of Onset  . Brain cancer Mother   . Diabetes Mother   . Diabetes Father   . Pulmonary embolism Brother   . Liver disease Neg Hx     ALLERGIES:  has No Known Allergies.  MEDICATIONS:  Current Outpatient Medications  Medication Sig Dispense Refill  . apixaban (ELIQUIS) 5 MG TABS tablet Take 1 tablet (5 mg total) by mouth 2 (two) times daily. 60 tablet 5  . Cholecalciferol (VITAMIN D3) 125 MCG (5000 UT) TABS Take 1 tablet by mouth daily.    . Ferrous Sulfate (IRON) 325 (65 Fe) MG TABS Take 1 tablet (325 mg total) by mouth daily. 30 tablet 0  . furosemide (LASIX) 40 MG tablet Take 1 tablet (40 mg total) by mouth 2 (two) times daily. 60 tablet 3  . gabapentin (NEURONTIN) 300 MG capsule Take 1 capsule (300 mg total) by mouth at bedtime. 30 capsule 6  . lactulose (CHRONULAC) 10 GM/15ML solution Take 15 mLs (10 g total) by mouth daily. 473 mL 2  . Multiple Vitamin (MULTIVITAMIN) tablet Take 1 tablet by mouth daily.    Marland Kitchen omeprazole (PRILOSEC) 20 MG capsule Take 1 capsule (20 mg total) by mouth daily. 30 capsule 5  . propranolol (INDERAL) 20 MG tablet Take 1 tablet (20 mg total) by mouth 2 (two) times daily. 60 tablet 5  . sodium polystyrene (KAYEXALATE) powder Take by mouth once for 1 dose. 30 g 0  . spironolactone (ALDACTONE) 100 MG tablet Take 1 tablet (100 mg total) by mouth 2 (two) times daily with a meal. 60 tablet 2   No current facility-administered medications for this visit.    REVIEW OF SYSTEMS:   Constitutional: Denies fevers, chills or abnormal night sweats Eyes:  Denies blurriness of vision, double vision or watery eyes Ears, nose, mouth, throat, and face: Denies mucositis or sore throat Respiratory: Denies cough, dyspnea or wheezes Cardiovascular: Denies palpitation, chest discomfort.  Positive for leg swellings. Gastrointestinal: Positive for constipation. Skin: Denies abnormal skin rashes Lymphatics: Denies new lymphadenopathy or easy bruising Neurological:Denies numbness, tingling or new weaknesses Behavioral/Psych: Mood is stable, no new changes  All other systems were reviewed with the patient and are negative.  PHYSICAL EXAMINATION: ECOG PERFORMANCE STATUS: 1 - Symptomatic but completely ambulatory  Vitals:   10/20/19 0821  BP: 103/70  Pulse: 67  Resp: 18  Temp: 99 F (37.2 C)  SpO2: 100%   Filed Weights   10/20/19 0821  Weight: 217 lb (98.4 kg)    GENERAL:alert, no distress and  comfortable SKIN: skin color, texture, turgor are normal, no rashes or significant lesions EYES: normal, conjunctiva are pink and non-injected, sclera clear OROPHARYNX:no exudate, no erythema and lips, buccal mucosa, and tongue normal  NECK: supple, thyroid normal size, non-tender, without nodularity LYMPH:  no palpable lymphadenopathy in the cervical, axillary or inguinal LUNGS: clear to auscultation and percussion with normal breathing effort HEART: regular rate & rhythm and no murmurs.  2+ edema bilaterally. ABDOMEN: Soft and distended with no clearly palpable masses. Musculoskeletal:no cyanosis of digits and no clubbing  PSYCH: alert & oriented x 3 with fluent speech NEURO: no focal motor/sensory deficits  LABORATORY DATA:  I have reviewed the data as listed Recent Results (from the past 2160 hour(s))  Respiratory Panel by RT PCR (Flu A&B, Covid) - Nasopharyngeal Swab     Status: Abnormal   Collection Time: 08/25/19  1:23 PM   Specimen: Nasopharyngeal Swab  Result Value Ref Range   SARS Coronavirus 2 by RT PCR POSITIVE (A) NEGATIVE     Comment: RESULT CALLED TO, READ BACK BY AND VERIFIED WITH: DOSS,M AT 2258 PN 3.22.21 BY ISLEY,B (NOTE) SARS-CoV-2 target nucleic acids are DETECTED. SARS-CoV-2 RNA is generally detectable in upper respiratory specimens  during the acute phase of infection. Positive results are indicative of the presence of the identified virus, but do not rule out bacterial infection or co-infection with other pathogens not detected by the test. Clinical correlation with patient history and other diagnostic information is necessary to determine patient infection status. The expected result is Negative. Fact Sheet for Patients:  PinkCheek.be Fact Sheet for Healthcare Providers: GravelBags.it This test is not yet approved or cleared by the [place] FDA and  has been authorized for detection and/or diagnosis of SARS-CoV-2 by FDA under an Emergency Use Authorization (EUA).  This EUA will remain in effect (meaning this test can be used)  for the duration of  the COVID-19 declaration under Section 564(b)(1) of the Act, 21 U.S.C. section 360bbb-3(b)(1), unless the authorization is terminated or revoked sooner.    Influenza A by PCR NEGATIVE NEGATIVE   Influenza B by PCR NEGATIVE NEGATIVE    Comment: (NOTE) The Xpert Xpress SARS-CoV-2/FLU/RSV assay is intended as an aid in  the diagnosis of influenza from Nasopharyngeal swab specimens and  should not be used as a sole basis for treatment. Nasal washings and  aspirates are unacceptable for Xpert Xpress SARS-CoV-2/FLU/RSV  testing. Fact Sheet for Patients: PinkCheek.be Fact Sheet for Healthcare Providers: GravelBags.it This test is not yet approved or cleared by the [place] FDA and  has been authorized for detection and/or diagnosis of SARS-CoV-2 by  FDA under an Emergency Use Authorization (EUA). This EUA will remain  in effect  (meaning this test can be used) for the duration of the  Covid-19 declaration under Section 564(b)(1) of the Act, 21  U.S.C. section 360bbb-3(b)(1), unless the authorization is  terminated or revoked. Performed at [facility], [address], [place], [place] [address]   Comprehensive metabolic panel     Status: Abnormal   Collection Time: 08/25/19  1:29 PM  Result Value Ref Range   Sodium 136 135 - 145 mmol/L   Potassium 3.4 (L) 3.5 - 5.1 mmol/L   Chloride 103 98 - 111 mmol/L   CO2 24 22 - 32 mmol/L   Glucose, Bld 90 70 - 99 mg/dL    Comment: Glucose reference range applies only to samples taken after fasting for at least 8 hours.   BUN 18  6 - 20 mg/dL   Creatinine, Ser 0.84 0.61 - 1.24 mg/dL   Calcium 8.1 (L) 8.9 - 10.3 mg/dL   Total Protein 7.5 6.5 - 8.1 g/dL   Albumin 2.7 (L) 3.5 - 5.0 g/dL   AST 27 15 - 41 U/L   ALT 19 0 - 44 U/L   Alkaline Phosphatase 89 38 - 126 U/L   Total Bilirubin 0.7 0.3 - 1.2 mg/dL   GFR calc non Af Amer >60 >60 mL/min   GFR calc Af Amer >60 >60 mL/min   Anion gap 9 5 - 15    Comment: Performed at [facility], [address] [place], [place] [address]  Brain natriuretic peptide     Status: None   Collection Time: 08/25/19  1:29 PM  Result Value Ref Range   B Natriuretic Peptide 44.0 0.0 - 100.0 pg/mL    Comment: Performed at [facility], [address], [place], [place] [address]  Troponin I (High Sensitivity)     Status: None   Collection Time: 08/25/19  1:29 PM  Result Value Ref Range   Troponin I (High Sensitivity) <2 <18 ng/L    Comment: Performed at [facility], [address] [place], [place] [address]  CBC with Differential     Status: Abnormal   Collection Time: 08/25/19  1:29 PM  Result Value Ref Range   WBC 7.3 4.0 - 10.5 K/uL   RBC 4.18 (L) 4.22 - 5.81 MIL/uL   Hemoglobin 12.4 (L) 13.0 - 17.0 g/dL   HCT 38.4 (L) 39.0 - 52.0 %   MCV 91.9 80.0 - 100.0 fL   MCH 29.7 26.0 - 34.0 pg   MCHC 32.3 30.0 - 36.0 g/dL   RDW 13.1 11.5 -  15.5 %   Platelets 185 150 - 400 K/uL   nRBC 0.0 0.0 - 0.2 %   Neutrophils Relative % 83 %   Neutro Abs 6.1 1.7 - 7.7 K/uL   Lymphocytes Relative 9 %   Lymphs Abs 0.6 (L) 0.7 - 4.0 K/uL   Monocytes Relative 7 %   Monocytes Absolute 0.5 0.1 - 1.0 K/uL   Eosinophils Relative 1 %   Eosinophils Absolute 0.1 0.0 - 0.5 K/uL   Basophils Relative 0 %   Basophils Absolute 0.0 0.0 - 0.1 K/uL   Immature Granulocytes 0 %   Abs Immature Granulocytes 0.01 0.00 - 0.07 K/uL    Comment: Performed at [facility], [address] [place], [place] [address]  Protime-INR     Status: Abnormal   Collection Time: 08/25/19  1:29 PM  Result Value Ref Range   Prothrombin Time 15.3 (H) 11.4 - 15.2 seconds   INR 1.2 0.8 - 1.2    Comment: (NOTE) INR goal varies based on device and disease states. Performed at [facility], [address] [place], [place] [address]   Urinalysis, Routine w reflex microscopic     Status: None   Collection Time: 08/25/19  2:51 PM  Result Value Ref Range   Color, Urine YELLOW YELLOW   APPearance CLEAR CLEAR   Specific Gravity, Urine 1.013 1.005 - 1.030   pH 5.0 5.0 - 8.0   Glucose, UA NEGATIVE NEGATIVE mg/dL   Hgb urine dipstick NEGATIVE NEGATIVE   Bilirubin Urine NEGATIVE NEGATIVE   Ketones, ur NEGATIVE NEGATIVE mg/dL   Protein, ur NEGATIVE NEGATIVE mg/dL   Nitrite NEGATIVE NEGATIVE   Leukocytes,Ua NEGATIVE NEGATIVE    Comment: Performed at [facility], [address] [place],  [place] [address]  Troponin I (High Sensitivity)     Status: None   Collection Time: 08/25/19  3:22 PM  Result Value Ref Range   Troponin I (High Sensitivity) 2 <18 ng/L    Comment: (NOTE) Elevated high sensitivity troponin I (hsTnI) values and significant  changes across serial measurements may suggest ACS but many other  chronic and acute conditions are known to elevate hsTnI results.  Refer to the "Links" section for chest pain algorithms and additional  guidance. Performed at [facility], [address], [place], [place] [address]   Comprehensive metabolic panel     Status: Abnormal   Collection Time: 09/10/19 10:25 AM  Result Value Ref Range   Sodium 137 135 - 145 mmol/L   Potassium 3.6 3.5 - 5.1 mmol/L   Chloride 102 98 - 111 mmol/L   CO2 27 22 - 32 mmol/L   Glucose, Bld 103 (H) 70 - 99 mg/dL    Comment: Glucose reference range applies only to samples taken after fasting for at least 8 hours.   BUN 13 6 - 20 mg/dL   Creatinine, Ser 0.72 0.61 - 1.24 mg/dL   Calcium 8.3 (L) 8.9 - 10.3 mg/dL   Total Protein 7.4 6.5 - 8.1 g/dL   Albumin 2.6 (L) 3.5 - 5.0 g/dL   AST 23 15 - 41 U/L   ALT 17 0 - 44 U/L   Alkaline Phosphatase 80 38 - 126 U/L   Total Bilirubin 0.7 0.3 - 1.2 mg/dL   GFR calc non Af Amer >60 >60 mL/min   GFR calc Af Amer >60 >60 mL/min   Anion gap 8 5 - 15    Comment: Performed at [facility], [address], [place], [place] [address]  CBC with Differential/Platelet     Status: Abnormal   Collection Time: 09/10/19 10:25 AM  Result Value Ref Range   WBC 6.7 4.0 - 10.5 K/uL   RBC 4.07 (L) 4.22 - 5.81 MIL/uL   Hemoglobin 12.1 (L) 13.0 - 17.0 g/dL   HCT 37.5 (L) 39.0 - 52.0 %   MCV 92.1 80.0 - 100.0 fL   MCH 29.7 26.0 - 34.0 pg   MCHC 32.3 30.0 - 36.0 g/dL   RDW 14.2 11.5 - 15.5 %   Platelets 111 (L) 150 - 400 K/uL    Comment: PLATELET COUNT CONFIRMED BY SMEAR SPECIMEN CHECKED FOR CLOTS Immature Platelet Fraction may be clinically indicated, consider ordering this additional test [id number]    nRBC 0.0 0.0 - 0.2 %   Neutrophils Relative % 79 %   Neutro Abs 5.3 1.7 - 7.7 K/uL   Lymphocytes Relative 8 %   Lymphs Abs 0.6 (L) 0.7 - 4.0 K/uL   Monocytes Relative 10 %   Monocytes Absolute 0.7 0.1 - 1.0 K/uL   Eosinophils Relative 3 %   Eosinophils Absolute 0.2 0.0 - 0.5 K/uL   Basophils Relative 0 %   Basophils Absolute 0.0 0.0 - 0.1 K/uL   Immature Granulocytes 0 %   Abs Immature Granulocytes 0.02 0.00 - 0.07 K/uL    Comment: Performed at [facility], [address], [place], [place] [address]  Protime-INR     Status: None   Collection Time: 09/10/19 10:25 AM  Result Value Ref Range   Prothrombin Time 15.1 11.4 - 15.2 seconds   INR 1.2 0.8 - 1.2    Comment: (NOTE) INR goal varies based on device and disease states. Performed at [facility], [place]  [address], [place], [place] [address]   Hepatitis panel, acute     Status: None   Collection Time: 09/10/19 10:25 AM  Result Value Ref Range   Hepatitis B Surface Ag NON REACTIVE NON REACTIVE   HCV Ab NON REACTIVE NON REACTIVE    Comment: (NOTE) Nonreactive HCV antibody screen is consistent with no HCV infections,  unless recent infection is suspected or other evidence exists to indicate HCV infection.    Hep A IgM NON REACTIVE NON REACTIVE   Hep B C IgM NON REACTIVE NON REACTIVE    Comment: Performed at [facility], [place] [address], [place], [place] [address]  Lactate dehydrogenase (pleural or peritoneal fluid)     Status: Abnormal   Collection Time: 09/10/19 10:27 AM  Result Value Ref Range   LD, Fluid 71 (H) 3 - 23 U/L    Comment: (NOTE) Results should be evaluated in conjunction with serum values    Fluid Type-FLDH ASCITIC     Comment: Performed at [facility], [address], [place], [place] [address] CORRECTED ON 04/07 AT 1051: PREVIOUSLY REPORTED AS PERITONEAL CAVITY   Glucose, pleural or peritoneal fluid     Status: None   Collection Time: 09/10/19 10:27 AM  Result Value Ref Range   Glucose, Fluid 116 mg/dL    Comment: (NOTE) No normal range established for this test Results should be evaluated in conjunction with serum values    Fluid Type-FGLU ASCITIC     Comment: Performed at [facility], [address], [place], [place] [address] CORRECTED ON 04/07 AT 1051: PREVIOUSLY REPORTED AS PERITONEAL CAVITY   Protein, pleural or peritoneal fluid     Status: None   Collection Time: 09/10/19 10:27 AM  Result Value Ref Range   Total protein, fluid <3.0 g/dL    Fluid Type-FTP ASCITIC     Comment: Performed at [facility], [address] [place], [place] [address] CORRECTED ON 04/07 AT 1051: PREVIOUSLY REPORTED AS PERITONEAL CAVITY   Albumin, pleural or peritoneal fluid     Status: None   Collection Time: 09/10/19 10:27 AM  Result Value Ref Range   Albumin, Fluid 0.9 g/dL    Comment: (NOTE) No normal range established for this test Results should be evaluated in conjunction with serum values    Fluid Type-FALB ASCITIC     Comment: Performed at [facility], [address], [place], [place] [address] CORRECTED ON 04/07 AT 1051: PREVIOUSLY REPORTED AS PERITONEAL CAVITY   Body fluid cell count with differential     Status: Abnormal   Collection Time: 09/10/19 10:27 AM  Result Value Ref Range   Fluid Type-FCT ASCITIC     Comment: CORRECTED ON 04/07 AT 1051: PREVIOUSLY REPORTED AS Peritoneal   Color, Fluid YELLOW YELLOW   Appearance, Fluid HAZY (A) CLEAR   Total Nucleated Cell Count, Fluid 215 0 - 1,000 cu mm   Neutrophil Count, Fluid 2 0 - 25 %   Lymphs, Fluid 69 %   Monocyte-Macrophage-Serous Fluid 29 (L) 50 - 90 %    Comment: Performed at [facility], [address] [place], [place] [address]  Acetaminophen level     Status: Abnormal   Collection Time: 09/10/19 10:27 AM  Result Value Ref Range   Acetaminophen (Tylenol), Serum <10 (L) 10 - 30 ug/mL    Comment: (NOTE) Therapeutic concentrations vary significantly. A range of 10-30 ug/mL  may be an effective concentration for many patients. However, some  are best treated at concentrations outside  of this range. Acetaminophen concentrations >150 ug/mL at 4 hours after ingestion  and >50 ug/mL at 12 hours after ingestion are often associated with  toxic reactions. Performed at [facility], [address] [place], [place] [address]   Culture, body fluid-bottle     Status: None   Collection Time: 09/10/19 10:27 AM   Specimen: Ascitic  Result Value Ref Range   Specimen Description  ASCITIC    Special Requests BOTTLES DRAWN AEROBIC AND ANAEROBIC 10CC    Culture      NO GROWTH 5 DAYS Performed at [facility], [address], [place], [place] [address]    Report Status 09/15/2019 FINAL   Gram stain     Status: None   Collection Time: 09/10/19 10:27 AM   Specimen: Ascitic  Result Value Ref Range   Specimen Description ASCITIC    Special Requests NONE    Gram Stain      NO ORGANISMS SEEN WBC PRESENT, PREDOMINANTLY MONONUCLEAR CYTOSPIN SMEAR Performed at [facility], [address], [place], [place] [address]    Report Status 09/10/2019 FINAL   Pathologist smear review     Status: None   Collection Time: 09/10/19 10:27 AM  Result Value Ref Range   Path Review Reviewed By Violet Baldy, M.D.     Comment: 09/11/2019        REACTIVE MESOTHELIAL CELLS AND MACROPHAGES PRESENT, NO ATYPIA SEEN. Performed at [facility], [place] [address], [place], [place] [address]   Ammonia     Status: None   Collection Time: 09/10/19 10:40 AM  Result Value Ref Range   Ammonia 27 9 - 35 umol/L    Comment: Performed at [facility], [address], [place], [place] [address]  SARS CORONAVIRUS 2 (TAT 6-24 HRS) Nasopharyngeal Nasopharyngeal Swab     Status: None   Collection Time: 09/10/19 12:09 PM   Specimen: Nasopharyngeal Swab  Result Value Ref Range   SARS Coronavirus 2 NEGATIVE NEGATIVE    Comment: (NOTE) SARS-CoV-2 target nucleic acids are NOT DETECTED. The SARS-CoV-2 RNA is generally detectable in upper and lower respiratory specimens during the acute phase of infection. Negative results do not preclude SARS-CoV-2 infection, do not rule out co-infections with other pathogens, and should not be used as the sole basis for treatment or other patient management decisions. Negative results must be combined with clinical observations, patient history, and epidemiological information. The expected result is Negative. Fact Sheet for  Patients: SugarRoll.be Fact Sheet for Healthcare Providers: https://www.woods-mathews.com/ This test is not yet approved or cleared by the [place] FDA and  has been authorized for detection and/or diagnosis of SARS-CoV-2 by FDA under an Emergency Use Authorization (EUA). This EUA will remain  in effect (meaning this test can be used) for the duration of the COVID-19 declaration under Section 56 4(b)(1) of the Act, 21 U.S.C. section 360bbb-3(b)(1), unless the authorization is terminated or revoked sooner. Performed at [facility], [place] [address] [place], [place] [address]   HIV Antibody (routine testing w rflx)     Status: None   Collection Time: 09/10/19  3:26 PM  Result Value Ref Range   HIV Screen 4th Generation wRfx NON REACTIVE NON REACTIVE    Comment: Performed at [facility], [place] [address] [place], [place] [address]  Protime-INR     Status: None   Collection Time: 09/10/19  3:26 PM  Result Value Ref Range   Prothrombin Time 15.2 11.4 - 15.2 seconds  INR 1.2 0.8 - 1.2    Comment: (NOTE) INR goal varies based on device and disease states. Performed at [facility], [address], [place], [place] [address]   AFP tumor marker     Status: None   Collection Time: 09/10/19  3:26 PM  Result Value Ref Range   AFP, Serum, Tumor Marker 1.3 0.0 - 8.3 ng/mL    Comment: (NOTE) Roche Diagnostics Electrochemiluminescence Immunoassay (ECLIA) Values obtained with different assay methods or kits cannot be used interchangeably.  Results cannot be interpreted as absolute evidence of the presence or absence of malignant disease. This test is not interpretable in pregnant females. Performed At: [facility] [place], [place] [id number] Rush Farmer MD [id number]   Mitochondrial antibodies     Status: None   Collection Time: 09/10/19  3:26 PM  Result Value Ref Range   Mitochondrial M2 Ab, IgG <20.0  0.0 - 20.0 Units    Comment: (NOTE)                                Negative    0.0 - 20.0                                Equivocal  20.1 - 24.9                                Positive         >24.9 Mitochondrial (M2) Antibodies are found in 90-96% of patients with primary biliary cirrhosis. Performed At: [facility] [place], [place] [id number] Rush Farmer MD [id number]   Anti-smooth muscle antibody, IgG     Status: None   Collection Time: 09/10/19  3:26 PM  Result Value Ref Range   F-Actin IgG 11 0 - 19 Units    Comment: (NOTE)                 Negative                     0 - 19                 Weak positive               20 - 30                 Moderate to strong positive     >30 Actin Antibodies are found in 52-85% of patients with autoimmune hepatitis or chronic active hepatitis and in 22% of patients with primary biliary cirrhosis. Performed At: [facility] [address] [place], [place] [id number] Rush Farmer MD [id number]   Alpha-1-antitrypsin     Status: Abnormal   Collection Time: 09/10/19  3:26 PM  Result Value Ref Range   A-1 Antitrypsin, Ser 206 (H) 101 - 187 mg/dL    Comment: (NOTE) Performed At: [facility] [place], [place] [id number] Rush Farmer MD [id number]   Ferritin     Status: None   Collection Time: 09/10/19  3:26 PM  Result Value Ref Range   Ferritin 193 24 - 336 ng/mL    Comment: Performed at [facility], [address], [place], [place] [address]  Ceruloplasmin     Status: Abnormal   Collection Time: 09/10/19  3:26  PM  Result Value Ref Range   Ceruloplasmin 31.4 (H) 16.0 - 31.0 mg/dL    Comment: (NOTE) Performed At: [facility] [place], [place] [id number] Rush Farmer MD [id number]   ANA w/Reflex if Positive     Status: None   Collection Time: 09/10/19  3:26 PM  Result Value Ref Range   Anti Nuclear Antibody (ANA) Negative Negative     Comment: (NOTE) Performed At: [facility] [place], [place] [id number] Rush Farmer MD [id number]   Hepatitis B surface antigen     Status: None   Collection Time: 09/10/19  3:26 PM  Result Value Ref Range   Hepatitis B Surface Ag NON REACTIVE NON REACTIVE    Comment: Performed at [facility], [address] [place], [place] [address]  Hepatitis C antibody     Status: None   Collection Time: 09/10/19  3:26 PM  Result Value Ref Range   HCV Ab NON REACTIVE NON REACTIVE    Comment: (NOTE) Nonreactive HCV antibody screen is consistent with no HCV infections,  unless recent infection is suspected or other evidence exists to indicate HCV infection. Performed at [facility], [place] [address], [place], [place] [address]   ECHOCARDIOGRAM COMPLETE     Status: None   Collection Time: 09/10/19  4:38 PM  Result Value Ref Range   Weight 5,040 oz   Height 69 in   BP 134/94 mmHg  Urinalysis, Complete w Microscopic     Status: Abnormal   Collection Time: 09/10/19  9:00 PM  Result Value Ref Range   Color, Urine YELLOW YELLOW   APPearance CLEAR CLEAR   Specific Gravity, Urine 1.012 1.005 - 1.030   pH 9.0 (H) 5.0 - 8.0   Glucose, UA NEGATIVE NEGATIVE mg/dL   Hgb urine dipstick NEGATIVE NEGATIVE   Bilirubin Urine NEGATIVE NEGATIVE   Ketones, ur NEGATIVE NEGATIVE mg/dL   Protein, ur NEGATIVE NEGATIVE mg/dL   Nitrite NEGATIVE NEGATIVE   Leukocytes,Ua NEGATIVE NEGATIVE   RBC / HPF 0-5 0 - 5 RBC/hpf   WBC, UA 0-5 0 - 5 WBC/hpf   Bacteria, UA NONE SEEN NONE SEEN    Comment: Performed at [facility], [address], [place], [place] [address]  Protein / creatinine ratio, urine     Status: None   Collection Time: 09/10/19  9:00 PM  Result Value Ref Range   Creatinine, Urine 82.66 mg/dL   Total Protein, Urine 11 mg/dL    Comment: NO NORMAL RANGE ESTABLISHED FOR THIS TEST   Protein Creatinine Ratio 0.13 0.00 - 0.15 mg/mg[Cre]    Comment: Performed at  [facility], [address], [place],  [address]  Urine rapid drug screen (hosp performed)     Status: None   Collection Time: 09/10/19  9:00 PM  Result Value Ref Range   Opiates NONE DETECTED NONE DETECTED   Cocaine NONE DETECTED NONE DETECTED   Benzodiazepines NONE DETECTED NONE DETECTED   Amphetamines NONE DETECTED NONE DETECTED   Tetrahydrocannabinol NONE DETECTED NONE DETECTED   Barbiturates NONE DETECTED NONE DETECTED    Comment: (NOTE) DRUG SCREEN FOR MEDICAL PURPOSES ONLY.  IF CONFIRMATION IS NEEDED FOR ANY PURPOSE, NOTIFY LAB WITHIN 5 DAYS. LOWEST DETECTABLE LIMITS FOR URINE DRUG SCREEN Drug Class                     Cutoff (ng/mL) Amphetamine and metabolites    1000 Barbiturate and metabolites  200 Benzodiazepine                 010 Tricyclics and metabolites     300 Opiates and metabolites        300 Cocaine and metabolites        300 THC                            50 Performed at [facility], [address] [place], [place] [address]   Comprehensive metabolic panel     Status: Abnormal   Collection Time: 09/11/19  5:35 AM  Result Value Ref Range   Sodium 139 135 - 145 mmol/L   Potassium 4.0 3.5 - 5.1 mmol/L   Chloride 104 98 - 111 mmol/L   CO2 30 22 - 32 mmol/L   Glucose, Bld 88 70 - 99 mg/dL    Comment: Glucose reference range applies only to samples taken after fasting for at least 8 hours.   BUN 13 6 - 20 mg/dL   Creatinine, Ser 0.72 0.61 - 1.24 mg/dL   Calcium 8.3 (L) 8.9 - 10.3 mg/dL   Total Protein 6.5 6.5 - 8.1 g/dL   Albumin 2.6 (L) 3.5 - 5.0 g/dL   AST 23 15 - 41 U/L   ALT 16 0 - 44 U/L   Alkaline Phosphatase 70 38 - 126 U/L   Total Bilirubin 1.3 (H) 0.3 - 1.2 mg/dL   GFR calc non Af Amer >60 >60 mL/min   GFR calc Af Amer >60 >60 mL/min   Anion gap 5 5 - 15    Comment: Performed at [facility], [address] [place], [place] [address]  CBC     Status: Abnormal   Collection Time: 09/11/19  5:35 AM  Result Value Ref Range   WBC 5.8  4.0 - 10.5 K/uL   RBC 4.11 (L) 4.22 - 5.81 MIL/uL   Hemoglobin 12.1 (L) 13.0 - 17.0 g/dL   HCT 38.4 (L) 39.0 - 52.0 %   MCV 93.4 80.0 - 100.0 fL   MCH 29.4 26.0 - 34.0 pg   MCHC 31.5 30.0 - 36.0 g/dL   RDW 14.3 11.5 - 15.5 %   Platelets 106 (L) 150 - 400 K/uL    Comment: SPECIMEN CHECKED FOR CLOTS Immature Platelet Fraction may be clinically indicated, consider ordering this additional test [id number] CONSISTENT WITH PREVIOUS RESULT    nRBC 0.0 0.0 - 0.2 %    Comment: Performed at [facility], [address], [place], [place] [address]  Surgical pathology     Status: None   Collection Time: 09/12/19  1:26 PM  Result Value Ref Range   SURGICAL PATHOLOGY      SURGICAL PATHOLOGY CASE: [id number] PATIENT: [name] Surgical Pathology Report     Clinical History: Portal vein thrombosis, need to screen for esophageal varices prior to anticoagulation, cirrhosis (crm)     FINAL MICROSCOPIC DIAGNOSIS:  A. STOMACH, POLYPECTOMY: - Hyperplastic polyp. - There is no evidence of malignancy.  B. STOMACH, MUCOUS, BIOPSY: - Chronic inactive gastritis, mild. - There is no evidence of Helicobacter pylori, dysplasia, or malignancy. - See comment.  COMMENT:  A.  A Warthin-Starry stain is negative for the presence of Helicobacter pylori organisms.   GROSS DESCRIPTION:  A.  Received in formalin is a tan, soft tissue fragment that is submitted in toto in 1 block.  Size: 0.2 cm  B.  Received in formalin are tan,  soft tissue fragments that are submitted in toto. Number: Few fragments size: Average 0.1 cm blocks: 1. (AK 09/15/2019)    Final Diagnosis performed by Enid Cutter, MD.   Electronically signed 09/16/2019  Technical component performed at [facility], [place] [address], [place], [place] [address].  Professional component performed at [employer], [place] [address] [place], [place] [address].  Immunohistochemistry Technical  component (if applicable) was performed at [facility]. [address], [place], [place], [place] [address].   IMMUNOHISTOCHEMISTRY DISCLAIMER (if applicable): Some of these immunohistochemical stains may have been developed and the performance characteristics determine by [facility]. Some may not have been cleared or approved by the U.S. Food and Drug Administration. The FDA has determined that such clearance or approval is not necessary. This test is used for clinical purposes. It should not be regarded as investigational or for research. This laboratory is certified under the [place] (CLIA-88) as qualified to perform high complexity clinical lab oratory testing.  The controls stained appropriately.   APTT     Status: None   Collection Time: 09/12/19  2:47 PM  Result Value Ref Range   aPTT 36 24 - 36 seconds    Comment: Performed at [facility], [address], [place], [place] [address]  Heparin level (unfractionated)     Status: Abnormal   Collection Time: 09/12/19  8:54 PM  Result Value Ref Range   Heparin Unfractionated 0.13 (L) 0.30 - 0.70 IU/mL    Comment: (NOTE) If heparin results are below expected values, and patient dosage has  been confirmed, suggest follow up testing of antithrombin III levels. Performed at [facility], [address], [place], [place] [address]   CBC     Status: Abnormal   Collection Time: 09/13/19  5:51 AM  Result Value Ref Range   WBC 4.4 4.0 - 10.5 K/uL   RBC 3.60 (L) 4.22 - 5.81 MIL/uL   Hemoglobin 10.5 (L) 13.0 - 17.0 g/dL   HCT 34.1 (L) 39.0 - 52.0 %   MCV 94.7 80.0 - 100.0 fL   MCH 29.2 26.0 - 34.0 pg   MCHC 30.8 30.0 - 36.0 g/dL   RDW 13.8 11.5 - 15.5 %   Platelets 90 (L) 150 - 400 K/uL    Comment: SPECIMEN CHECKED FOR CLOTS CONSISTENT WITH PREVIOUS RESULT    nRBC 0.0 0.0 - 0.2 %    Comment: Performed at [facility], [address] [place],  [address]   Comprehensive metabolic panel     Status: Abnormal   Collection Time: 09/13/19  5:51 AM  Result Value Ref Range   Sodium 138 135 - 145 mmol/L   Potassium 4.4 3.5 - 5.1 mmol/L   Chloride 101 98 - 111 mmol/L   CO2 31 22 - 32 mmol/L   Glucose, Bld 78 70 - 99 mg/dL    Comment: Glucose reference range applies only to samples taken after fasting for at least 8 hours.   BUN 12 6 - 20 mg/dL   Creatinine, Ser 0.70 0.61 - 1.24 mg/dL   Calcium 8.5 (L) 8.9 - 10.3 mg/dL   Total Protein 5.9 (L) 6.5 - 8.1 g/dL   Albumin 2.7 (L) 3.5 - 5.0 g/dL   AST 18 15 - 41 U/L   ALT 12 0 - 44 U/L   Alkaline Phosphatase 53 38 - 126 U/L   Total Bilirubin 1.1 0.3 - 1.2 mg/dL  GFR calc non Af Amer >60 >60 mL/min   GFR calc Af Amer >60 >60 mL/min   Anion gap 6 5 - 15    Comment: Performed at [facility], [address] [place], [place] [address]  Heparin level (unfractionated)     Status: Abnormal   Collection Time: 09/13/19  5:51 AM  Result Value Ref Range   Heparin Unfractionated 0.17 (L) 0.30 - 0.70 IU/mL    Comment: (NOTE) If heparin results are below expected values, and patient dosage has  been confirmed, suggest follow up testing of antithrombin III levels. Performed at [facility], [address], [place], [place] [address]   Heparin level (unfractionated)     Status: None   Collection Time: 09/13/19  2:44 PM  Result Value Ref Range   Heparin Unfractionated 0.39 0.30 - 0.70 IU/mL    Comment: (NOTE) If heparin results are below expected values, and patient dosage has  been confirmed, suggest follow up testing of antithrombin III levels. Performed at [facility], [address] [place], [place] [address]   Sodium, urine, random     Status: None   Collection Time: 09/13/19  5:30 PM  Result Value Ref Range   Sodium, Ur 23 mmol/L    Comment: Performed at [facility], [address] [place], [place] [address]  Heparin level (unfractionated)     Status: Abnormal   Collection Time: 09/13/19 10:19  PM  Result Value Ref Range   Heparin Unfractionated 0.24 (L) 0.30 - 0.70 IU/mL    Comment: (NOTE) If heparin results are below expected values, and patient dosage has  been confirmed, suggest follow up testing of antithrombin III levels. Performed at [facility], [address] [place], [place] [address]   CBC     Status: Abnormal   Collection Time: 09/14/19  5:12 AM  Result Value Ref Range   WBC 4.3 4.0 - 10.5 K/uL   RBC 3.89 (L) 4.22 - 5.81 MIL/uL   Hemoglobin 11.3 (L) 13.0 - 17.0 g/dL   HCT 36.3 (L) 39.0 - 52.0 %   MCV 93.3 80.0 - 100.0 fL   MCH 29.0 26.0 - 34.0 pg   MCHC 31.1 30.0 - 36.0 g/dL   RDW 14.2 11.5 - 15.5 %   Platelets 101 (L) 150 - 400 K/uL    Comment: SPECIMEN CHECKED FOR CLOTS Immature Platelet Fraction may be clinically indicated, consider ordering this additional test [id number] PLATELET COUNT CONFIRMED BY SMEAR    nRBC 0.0 0.0 - 0.2 %    Comment: Performed at [facility], [address], [place], [place] [address]  Heparin level (unfractionated)     Status: None   Collection Time: 09/14/19  5:12 AM  Result Value Ref Range   Heparin Unfractionated 0.49 0.30 - 0.70 IU/mL    Comment: (NOTE) If heparin results are below expected values, and patient dosage has  been confirmed, suggest follow up testing of antithrombin III levels. Performed at [facility], [address], [place], [place] [address]   Basic metabolic panel     Status: Abnormal   Collection Time: 09/14/19  5:12 AM  Result Value Ref Range   Sodium 139 135 - 145 mmol/L   Potassium 3.7 3.5 - 5.1 mmol/L   Chloride 101 98 - 111 mmol/L   CO2 30 22 - 32 mmol/L   Glucose, Bld 85 70 - 99 mg/dL    Comment: Glucose reference range applies only to samples taken after fasting for at least 8 hours.  BUN 11 6 - 20 mg/dL   Creatinine, Ser 0.72 0.61 - 1.24 mg/dL   Calcium 8.6 (L) 8.9 - 10.3 mg/dL   GFR calc non Af Amer >60 >60 mL/min   GFR calc Af Amer >60 >60 mL/min   Anion gap 8 5 - 15    Comment:  Performed at [facility], [address], [place], [place] [address]  Heparin level (unfractionated)     Status: None   Collection Time: 09/14/19 12:33 PM  Result Value Ref Range   Heparin Unfractionated 0.51 0.30 - 0.70 IU/mL    Comment: (NOTE) If heparin results are below expected values, and patient dosage has  been confirmed, suggest follow up testing of antithrombin III levels. Performed at [facility], [address] [place], [place] [address]   CBC     Status: Abnormal   Collection Time: 09/15/19  4:48 AM  Result Value Ref Range   WBC 4.6 4.0 - 10.5 K/uL   RBC 3.63 (L) 4.22 - 5.81 MIL/uL   Hemoglobin 10.7 (L) 13.0 - 17.0 g/dL   HCT 34.0 (L) 39.0 - 52.0 %   MCV 93.7 80.0 - 100.0 fL   MCH 29.5 26.0 - 34.0 pg   MCHC 31.5 30.0 - 36.0 g/dL   RDW 14.1 11.5 - 15.5 %   Platelets 107 (L) 150 - 400 K/uL    Comment: SPECIMEN CHECKED FOR CLOTS CONSISTENT WITH PREVIOUS RESULT    nRBC 0.0 0.0 - 0.2 %    Comment: Performed at [facility], [address] [place], [place] [address]  Comprehensive metabolic panel     Status: Abnormal   Collection Time: 09/15/19  4:48 AM  Result Value Ref Range   Sodium 138 135 - 145 mmol/L   Potassium 3.8 3.5 - 5.1 mmol/L   Chloride 101 98 - 111 mmol/L   CO2 30 22 - 32 mmol/L   Glucose, Bld 88 70 - 99 mg/dL    Comment: Glucose reference range applies only to samples taken after fasting for at least 8 hours.   BUN 15 6 - 20 mg/dL   Creatinine, Ser 0.76 0.61 - 1.24 mg/dL   Calcium 8.3 (L) 8.9 - 10.3 mg/dL   Total Protein 6.0 (L) 6.5 - 8.1 g/dL   Albumin 2.7 (L) 3.5 - 5.0 g/dL   AST 21 15 - 41 U/L   ALT 12 0 - 44 U/L   Alkaline Phosphatase 54 38 - 126 U/L   Total Bilirubin 0.6 0.3 - 1.2 mg/dL   GFR calc non Af Amer >60 >60 mL/min   GFR calc Af Amer >60 >60 mL/min   Anion gap 7 5 - 15    Comment: Performed at [facility], [address] [place], [place] [address]  Heparin level (unfractionated)     Status: None   Collection Time: 09/15/19  4:49  AM  Result Value Ref Range   Heparin Unfractionated 0.36 0.30 - 0.70 IU/mL    Comment: (NOTE) If heparin results are below expected values, and patient dosage has  been confirmed, suggest follow up testing of antithrombin III levels. Performed at [facility], [address], [place], [place] [address]   Occult blood card to lab, stool RN will collect     Status: Abnormal   Collection Time: 09/15/19 10:32 AM  Result Value Ref Range   Fecal Occult Bld POSITIVE (A) NEGATIVE    Comment: Performed at [facility], [address] [place], [place] [address]  CBC  Status: Abnormal   Collection Time: 09/16/19  7:00 AM  Result Value Ref Range   WBC 4.6 4.0 - 10.5 K/uL   RBC 3.81 (L) 4.22 - 5.81 MIL/uL   Hemoglobin 11.1 (L) 13.0 - 17.0 g/dL   HCT 35.4 (L) 39.0 - 52.0 %   MCV 92.9 80.0 - 100.0 fL   MCH 29.1 26.0 - 34.0 pg   MCHC 31.4 30.0 - 36.0 g/dL   RDW 14.3 11.5 - 15.5 %   Platelets 109 (L) 150 - 400 K/uL    Comment: SPECIMEN CHECKED FOR CLOTS CONSISTENT WITH PREVIOUS RESULT Immature Platelet Fraction may be clinically indicated, consider ordering this additional test [id number]    nRBC 0.0 0.0 - 0.2 %    Comment: Performed at [facility], [address], [place], [place] [address]  Heparin level (unfractionated)     Status: Abnormal   Collection Time: 09/16/19  7:00 AM  Result Value Ref Range   Heparin Unfractionated 1.16 (H) 0.30 - 0.70 IU/mL    Comment: RESULTS CONFIRMED BY MANUAL DILUTION Performed at [facility], [address] [place], [place] [address]   APTT     Status: Abnormal   Collection Time: 09/16/19  7:00 AM  Result Value Ref Range   aPTT >200 (HH) 24 - 36 seconds    Comment:        IF BASELINE aPTT IS ELEVATED, SUGGEST PATIENT RISK ASSESSMENT BE USED TO DETERMINE APPROPRIATE ANTICOAGULANT THERAPY. CRITICAL RESULT CALLED TO, READ BACK BY AND VERIFIED WITH: FOX,J@1058  BY MATTHEWS, B 4.13.2021 Performed at [facility], [address], [place], [place]  [address]   APTT     Status: Abnormal   Collection Time: 09/16/19  1:36 PM  Result Value Ref Range   aPTT >200 (HH) 24 - 36 seconds    Comment:        IF BASELINE aPTT IS ELEVATED, SUGGEST PATIENT RISK ASSESSMENT BE USED TO DETERMINE APPROPRIATE ANTICOAGULANT THERAPY. REPEATED TO VERIFY SPECIMEN CHECKED FOR CLOTS CRITICAL RESULT CALLED TO, READ BACK BY AND VERIFIED WITH: Wright,E@1437  by Santiago Glad 4.13.2021 Performed at [facility], [address], [place], [place] [address]   Heparin level (unfractionated)     Status: Abnormal   Collection Time: 09/16/19  1:36 PM  Result Value Ref Range   Heparin Unfractionated 0.92 (H) 0.30 - 0.70 IU/mL    Comment: (NOTE) If heparin results are below expected values, and patient dosage has  been confirmed, suggest follow up testing of antithrombin III levels. Performed at [facility], [address] [place], [place] [address]   APTT     Status: Abnormal   Collection Time: 09/17/19 12:00 AM  Result Value Ref Range   aPTT 163 (HH) 24 - 36 seconds    Comment:        IF BASELINE aPTT IS ELEVATED, SUGGEST PATIENT RISK ASSESSMENT BE USED TO DETERMINE APPROPRIATE ANTICOAGULANT THERAPY. REPEATED TO VERIFY CRITICAL RESULT CALLED TO, READ BACK BY AND VERIFIED WITH: C THOMAS,RN @0050  09/17/19 MKELLY Performed at [facility], [address] [place], [place] [address]   Heparin level (unfractionated)     Status: None   Collection Time: 09/17/19 12:00 AM  Result Value Ref Range   Heparin Unfractionated 0.49 0.30 - 0.70 IU/mL    Comment: (NOTE) If heparin results are below expected values, and patient dosage has  been confirmed, suggest follow up testing of antithrombin III levels. Performed at [facility], [address] [place],  [address]   CBC  Status: Abnormal   Collection Time: 09/17/19 12:00 AM  Result Value Ref Range   WBC 4.1 4.0 - 10.5 K/uL   RBC 3.16 (L) 4.22 - 5.81 MIL/uL   Hemoglobin 9.4 (L) 13.0 - 17.0 g/dL   HCT 29.3  (L) 39.0 - 52.0 %   MCV 92.7 80.0 - 100.0 fL   MCH 29.7 26.0 - 34.0 pg   MCHC 32.1 30.0 - 36.0 g/dL   RDW 14.2 11.5 - 15.5 %   Platelets 99 (L) 150 - 400 K/uL    Comment: CONSISTENT WITH PREVIOUS RESULT Immature Platelet Fraction may be clinically indicated, consider ordering this additional test [id number]    nRBC 0.0 0.0 - 0.2 %    Comment: Performed at [facility], [address] [place], [place] [address]  Basic metabolic panel     Status: Abnormal   Collection Time: 09/17/19 12:00 AM  Result Value Ref Range   Sodium 135 135 - 145 mmol/L   Potassium 3.9 3.5 - 5.1 mmol/L   Chloride 102 98 - 111 mmol/L   CO2 27 22 - 32 mmol/L   Glucose, Bld 95 70 - 99 mg/dL    Comment: Glucose reference range applies only to samples taken after fasting for at least 8 hours.   BUN 17 6 - 20 mg/dL   Creatinine, Ser 0.68 0.61 - 1.24 mg/dL   Calcium 8.4 (L) 8.9 - 10.3 mg/dL   GFR calc non Af Amer >60 >60 mL/min   GFR calc Af Amer >60 >60 mL/min   Anion gap 6 5 - 15    Comment: Performed at [facility], [address] [place], [place] [address]  APTT     Status: Abnormal   Collection Time: 09/17/19  7:10 AM  Result Value Ref Range   aPTT 164 (HH) 24 - 36 seconds    Comment:        IF BASELINE aPTT IS ELEVATED, SUGGEST PATIENT RISK ASSESSMENT BE USED TO DETERMINE APPROPRIATE ANTICOAGULANT THERAPY. REPEATED TO VERIFY CRITICAL RESULT CALLED TO, READ BACK BY AND VERIFIED WITH: Evalyn Casco AT 7026 09/17/19 BY HFLYNT Performed at [facility], [address], [place], [place] [address]   Heparin level (unfractionated)     Status: None   Collection Time: 09/17/19  8:48 AM  Result Value Ref Range   Heparin Unfractionated 0.51 0.30 - 0.70 IU/mL    Comment: (NOTE) If heparin results are below expected values, and patient dosage has  been confirmed, suggest follow up testing of antithrombin III levels. Performed at [facility], [address], [place], [place]  [address]   CBC     Status: Abnormal   Collection Time: 09/18/19  5:36 AM  Result Value Ref Range   WBC 3.5 (L) 4.0 - 10.5 K/uL   RBC 3.14 (L) 4.22 - 5.81 MIL/uL   Hemoglobin 9.4 (L) 13.0 - 17.0 g/dL   HCT 29.2 (L) 39.0 - 52.0 %   MCV 93.0 80.0 - 100.0 fL   MCH 29.9 26.0 - 34.0 pg   MCHC 32.2 30.0 - 36.0 g/dL   RDW 14.7 11.5 - 15.5 %   Platelets 95 (L) 150 - 400 K/uL    Comment: CONSISTENT WITH PREVIOUS RESULT Immature Platelet Fraction may be clinically indicated, consider ordering this additional test [id number]    nRBC 0.0 0.0 - 0.2 %    Comment: Performed at [facility], [address], [place], [place] [address]  Heparin level (unfractionated)  Status: Abnormal   Collection Time: 09/18/19  5:36 AM  Result Value Ref Range   Heparin Unfractionated 0.27 (L) 0.30 - 0.70 IU/mL    Comment: (NOTE) If heparin results are below expected values, and patient dosage has  been confirmed, suggest follow up testing of antithrombin III levels. Performed at [facility], [address], [place], [place] [address]   Comprehensive metabolic panel     Status: Abnormal   Collection Time: 09/18/19  5:36 AM  Result Value Ref Range   Sodium 137 135 - 145 mmol/L   Potassium 4.0 3.5 - 5.1 mmol/L   Chloride 100 98 - 111 mmol/L   CO2 26 22 - 32 mmol/L   Glucose, Bld 88 70 - 99 mg/dL    Comment: Glucose reference range applies only to samples taken after fasting for at least 8 hours.   BUN 15 6 - 20 mg/dL   Creatinine, Ser 0.72 0.61 - 1.24 mg/dL   Calcium 9.1 8.9 - 10.3 mg/dL   Total Protein 6.7 6.5 - 8.1 g/dL   Albumin 3.1 (L) 3.5 - 5.0 g/dL   AST 22 15 - 41 U/L   ALT 16 0 - 44 U/L   Alkaline Phosphatase 55 38 - 126 U/L   Total Bilirubin 0.6 0.3 - 1.2 mg/dL   GFR calc non Af Amer >60 >60 mL/min   GFR calc Af Amer >60 >60 mL/min   Anion gap 11 5 - 15    Comment: Performed at [facility], [address] [place], [place] [address]  Surgical pathology     Status: None   Collection Time:  09/18/19  2:00 PM  Result Value Ref Range   SURGICAL PATHOLOGY      SURGICAL PATHOLOGY CASE: [id number] PATIENT: [name] Surgical Pathology Report     Clinical History: anemia heme positive stool, melena     FINAL MICROSCOPIC DIAGNOSIS:  A. COLON, CECAL, ASCENDING, HEPATIC FLEXURE, POLYPECTOMY: - Tubular adenoma(s) without high-grade dysplasia or malignancy - Inflammatory polyp(s)  B. COLON, TRANSVERSE, DESCENDING, SIGMOID, RECTUM, POLYPECTOMY: - Tubular adenoma(s) without high-grade dysplasia or malignancy - Inflammatory polyp(s)    GROSS DESCRIPTION:  A.  Received in formalin are tan, soft tissue fragments that are submitted in toto. Number: Multiple fragments size: 0.1 to 0.6 cm blocks: 2  B.  Received in formalin are tan, soft tissue fragments that are submitted in toto. Number: Multiple fragments with vegetative material size: Average 0.2 cm blocks: 1 (AK 09/19/2019)    Final Diagnosis performed by Jaquita Folds, MD.   Electronically signed 09/22/2019 Technical component performed at [place] [address] [place], [place] [address].  Professional component [facility] at [facility] [facility], [place] [address] [place], [place] [address].  Immunohistochemistry Technical component (if applicable) was performed at [facility]. [address] [place], [place], [place] [address].   IMMUNOHISTOCHEMISTRY DISCLAIMER (if applicable): Some of these immunohistochemical stains may have been developed and the performance characteristics determine by [facility]. Some may not have been cleared or approved by the U.S. Food and Drug Administration. The FDA has determined that such clearance or approval is not necessary. This test is used for clinical purposes. It should not be regarded as investigational or for research. This laboratory is certified under the [employer] (CLIA-88) as qualified to perform high complexity clinical laboratory testing.  The controls stained appro priately.   Heparin level (unfractionated)     Status:  Abnormal   Collection Time: 09/19/19  3:01 AM  Result Value Ref Range   Heparin Unfractionated <0.10 (L) 0.30 - 0.70 IU/mL    Comment: (NOTE) If heparin results are below expected values, and patient dosage has  been confirmed, suggest follow up testing of antithrombin III levels. Performed at [facility], [address], [place], [place] [address]   CBC     Status: Abnormal   Collection Time: 09/19/19  3:01 AM  Result Value Ref Range   WBC 4.5 4.0 - 10.5 K/uL   RBC 2.97 (L) 4.22 - 5.81 MIL/uL   Hemoglobin 8.9 (L) 13.0 - 17.0 g/dL   HCT 27.3 (L) 39.0 - 52.0 %   MCV 91.9 80.0 - 100.0 fL   MCH 30.0 26.0 - 34.0 pg   MCHC 32.6 30.0 - 36.0 g/dL   RDW 14.8 11.5 - 15.5 %   Platelets 96 (L) 150 - 400 K/uL    Comment: CONSISTENT WITH PREVIOUS RESULT Immature Platelet Fraction may be clinically indicated, consider ordering this additional test [id number]    nRBC 0.0 0.0 - 0.2 %    Comment: Performed at [facility], [address], [place], [place] [address]  Heparin level (unfractionated)     Status: None   Collection Time: 09/19/19  5:57 PM  Result Value Ref Range   Heparin Unfractionated 0.38 0.30 - 0.70 IU/mL    Comment: (NOTE) If heparin results are below expected values, and patient dosage has  been confirmed, suggest follow up testing of antithrombin III levels. Performed at [facility], [address], [place], [place] [address]   CBC     Status: Abnormal   Collection Time: 09/20/19  7:39 AM  Result Value Ref Range   WBC 4.5 4.0 - 10.5 K/uL   RBC 3.54 (L) 4.22 - 5.81 MIL/uL   Hemoglobin 10.4 (L) 13.0 - 17.0 g/dL   HCT 32.8 (L) 39.0 - 52.0 %   MCV 92.7 80.0 - 100.0 fL   MCH 29.4 26.0 - 34.0 pg   MCHC 31.7 30.0 - 36.0 g/dL   RDW 15.2 11.5 - 15.5 %   Platelets 101 (L) 150 - 400 K/uL     Comment: SPECIMEN CHECKED FOR CLOTS CONSISTENT WITH PREVIOUS RESULT    nRBC 0.0 0.0 - 0.2 %    Comment: Performed at [facility], [address] [place], [place] [address]  Heparin level (unfractionated)     Status: None   Collection Time: 09/20/19  7:39 AM  Result Value Ref Range   Heparin Unfractionated 0.46 0.30 - 0.70 IU/mL    Comment: (NOTE) If heparin results are below expected values, and patient dosage has  been confirmed, suggest follow up testing of antithrombin III levels. Performed at [facility], [address] [place], [place] [address]   CBC     Status: Abnormal   Collection Time: 09/21/19  8:02 AM  Result Value Ref Range   WBC 5.3 4.0 - 10.5 K/uL   RBC 3.68 (L) 4.22 - 5.81 MIL/uL   Hemoglobin 10.7 (L) 13.0 - 17.0 g/dL   HCT 34.1 (L) 39.0 - 52.0 %   MCV 92.7 80.0 - 100.0 fL   MCH 29.1 26.0 - 34.0 pg   MCHC 31.4 30.0 - 36.0 g/dL   RDW 15.4 11.5 - 15.5 %   Platelets 120 (L) 150 - 400 K/uL   nRBC 0.0 0.0 - 0.2 %    Comment: Performed at [facility], [address] [place], [place]  [address]  Basic metabolic panel     Status: Abnormal   Collection Time: 09/22/19  6:05 AM  Result Value Ref Range   Sodium 136 135 - 145 mmol/L   Potassium 3.8 3.5 - 5.1 mmol/L   Chloride 97 (L) 98 - 111 mmol/L   CO2 28 22 - 32 mmol/L   Glucose, Bld 86 70 - 99 mg/dL    Comment: Glucose reference range applies only to samples taken after fasting for at least 8 hours.   BUN 16 6 - 20 mg/dL   Creatinine, Ser 0.85 0.61 - 1.24 mg/dL   Calcium 9.1 8.9 - 10.3 mg/dL   GFR calc non Af Amer >60 >60 mL/min   GFR calc Af Amer >60 >60 mL/min   Anion gap 11 5 - 15    Comment: Performed at [facility], [address] [place], [place] [address]  CBC     Status: Abnormal   Collection Time: 09/22/19  6:05 AM  Result Value Ref Range   WBC 5.7 4.0 - 10.5 K/uL   RBC 3.74 (L) 4.22 - 5.81 MIL/uL   Hemoglobin 11.0 (L) 13.0 - 17.0 g/dL   HCT 35.2 (L) 39.0 - 52.0 %   MCV 94.1 80.0 - 100.0 fL   MCH  29.4 26.0 - 34.0 pg   MCHC 31.3 30.0 - 36.0 g/dL   RDW 15.9 (H) 11.5 - 15.5 %   Platelets 127 (L) 150 - 400 K/uL   nRBC 0.0 0.0 - 0.2 %    Comment: Performed at [facility], [address], [place], [place] [address]  Hepatitis B core antibody, total     Status: None   Collection Time: 10/20/19  8:55 AM  Result Value Ref Range   Hep B Core Total Ab NON REACTIVE NON REACTIVE    Comment: Performed at [facility] [address] [place], [place] [address]  CBC with Differential/Platelet     Status: Abnormal   Collection Time: 10/20/19  8:55 AM  Result Value Ref Range   WBC 14.1 (H) 4.0 - 10.5 K/uL   RBC 4.36 4.22 - 5.81 MIL/uL   Hemoglobin 12.8 (L) 13.0 - 17.0 g/dL   HCT 39.2 39.0 - 52.0 %   MCV 89.9 80.0 - 100.0 fL   MCH 29.4 26.0 - 34.0 pg   MCHC 32.7 30.0 - 36.0 g/dL   RDW 15.2 11.5 - 15.5 %   Platelets 323 150 - 400 K/uL   nRBC 0.0 0.0 - 0.2 %   Neutrophils Relative % 83 %   Neutro Abs 11.8 (H) 1.7 - 7.7 K/uL   Lymphocytes Relative 5 %   Lymphs Abs 0.7 0.7 - 4.0 K/uL   Monocytes Relative 10 %   Monocytes Absolute 1.3 (H) 0.1 - 1.0 K/uL   Eosinophils Relative 1 %   Eosinophils Absolute 0.1 0.0 - 0.5 K/uL   Basophils Relative 0 %   Basophils Absolute 0.0 0.0 - 0.1 K/uL   Immature Granulocytes 1 %   Abs Immature Granulocytes 0.10 (H) 0.00 - 0.07 K/uL    Comment: Performed at [facility], [address], [place], [place] [address]  Comprehensive metabolic panel     Status: Abnormal   Collection Time: 10/20/19  8:55 AM  Result Value Ref Range   Sodium 124 (L) 135 - 145 mmol/L   Potassium 5.5 (H) 3.5 - 5.1 mmol/L   Chloride 90 (L) 98 - 111 mmol/L   CO2 24 22 - 32 mmol/L  Glucose, Bld 110 (H) 70 - 99 mg/dL    Comment: Glucose reference range applies only to samples taken after fasting for at least 8 hours.   BUN 40 (H) 6 - 20 mg/dL   Creatinine, Ser 1.64 (H) 0.61 - 1.24 mg/dL   Calcium 8.8 (L) 8.9 - 10.3 mg/dL   Total Protein 8.3 (H) 6.5 - 8.1 g/dL   Albumin 3.1  (L) 3.5 - 5.0 g/dL   AST 23 15 - 41 U/L   ALT 23 0 - 44 U/L   Alkaline Phosphatase 100 38 - 126 U/L   Total Bilirubin 0.8 0.3 - 1.2 mg/dL   GFR calc non Af Amer 50 (L) >60 mL/min   GFR calc Af Amer 58 (L) >60 mL/min   Anion gap 10 5 - 15    Comment: Performed at [facility], [address] [place], [place] [address]  Lactate dehydrogenase     Status: None   Collection Time: 10/20/19  8:55 AM  Result Value Ref Range   LDH 103 98 - 192 U/L    Comment: Performed at [facility], [address] [place], [place] [address]  Sedimentation rate     Status: Abnormal   Collection Time: 10/20/19  8:55 AM  Result Value Ref Range   Sed Rate 57 (H) 0 - 16 mm/hr    Comment: Performed at [facility], [address] [place], [place] [address]  Iron and TIBC     Status: Abnormal   Collection Time: 10/20/19  8:55 AM  Result Value Ref Range   Iron 30 (L) 45 - 182 ug/dL   TIBC 289 250 - 450 ug/dL   Saturation Ratios 10 (L) 17.9 - 39.5 %   UIBC 259 ug/dL    Comment: Performed at [facility], [address], [place], [place] [address]  Ferritin     Status: None   Collection Time: 10/20/19  8:55 AM  Result Value Ref Range   Ferritin 233 24 - 336 ng/mL    Comment: Performed at [facility], [address] [place], [place] [address]  Vitamin B12     Status: None   Collection Time: 10/20/19  8:55 AM  Result Value Ref Range   Vitamin B-12 440 180 - 914 pg/mL    Comment: (NOTE) This assay is not validated for testing neonatal or myeloproliferative syndrome specimens for Vitamin B12 levels. Performed at [facility], [address], [place], [place] [address]   Folate     Status: None   Collection Time: 10/20/19  8:55 AM  Result Value Ref Range   Folate 26.5 >5.9 ng/mL    Comment: RESULTS CONFIRMED BY MANUAL DILUTION Performed at [facility], [address], [place], [place] [address]   Hepatitis B surface antibody,qualitative     Status: None   Collection Time: 10/20/19  8:55 AM  Result Value  Ref Range   Hep B S Ab NON REACTIVE NON REACTIVE    Comment: (NOTE) Inconsistent with immunity, less than 10 mIU/mL. Performed at [facility], [place] [address] [place], [place] [address]     RADIOGRAPHIC STUDIES: I have personally reviewed the radiological images as listed and agreed with the findings in the report. US Paracentesis  Result Date: 09/22/2019 INDICATION: 44 year old male with history of large volume of ascites. EXAM: ULTRASOUND GUIDED  PARACENTESIS MEDICATIONS: None. COMPLICATIONS: None. PROCEDURE: Informed written consent was obtained from the patient after a discussion of the risks, benefits and alternatives to treatment. A  timeout was performed prior to the initiation of the procedure. Initial ultrasound scanning demonstrates a large amount of ascites within the right lower abdominal quadrant. The right lower abdomen was prepped and draped in the usual sterile fashion. 1% lidocaine was used for local anesthesia. Following this, a Yueh centesis catheter was introduced. An ultrasound image was saved for documentation purposes. The paracentesis was performed. The catheter was removed and a dressing was applied. The patient tolerated the procedure well without immediate post procedural complication. Patient received post-procedure intravenous albumin; see nursing notes for details. FINDINGS: A total of approximately 4 L of serous fluid was removed. Samples were sent to the laboratory as requested by the clinical team. IMPRESSION: Successful ultrasound-guided paracentesis yielding 4 liters of peritoneal fluid. Electronically Signed   By: Vinnie Langton M.D.   On: 09/22/2019 14:40    ASSESSMENT & PLAN:  Thrombocytopenia ([place]) 1.  Mild to moderate thrombocytopenia: -Patient had mild to moderate thrombocytopenia ranging from 90-127 since April 2021.  Normal platelet count in March of this year. -No easy bruising or bleeding reported. -He was recently started on Eliquis for  portal vein thrombosis. -US Doppler on 09/11/2019 shows spleen measuring 18 cm x 5.5 cm x 22 cm with a total volume of 1163 cm3. -We will repeat his CBC, and a review for various nutritional deficiencies, infectious causes and check an SPEP. -Differential diagnosis includes immune mediated thrombocytopenia versus thrombocytopenia from splenomegaly.  2.  Portal vein thrombosis: -Ultrasound Doppler of the liver on 09/11/2019 showed nonocclusive hyperechoic thrombus along the main portal vein wall posteriorly extending into the right portal vein. -Hepatic cirrhosis and ascites were seen.  I would not do any hypercoagulable testing at this time as they can be altered by anticoagulation.  3.  New onset cirrhosis and ascites: -Patient reported swelling in the stomach and feet which started about 2 months ago. -I have obtained and reviewed CT AP from [facility] dated 08/17/2019 which showed liver morphology consistent with cirrhosis with no masses.  Enlarged spleen measuring 22 cm.  Nonocclusive thrombus in the portal vein.  Prominent to mildly enlarged periceliac and gastrohepatic ligament lymph nodes.  3.5 cm left adrenal mass consistent with adenoma. -He is currently on Lasix 40 mg twice daily and spironolactone 100 mg daily. -He reports significant weight loss since he was started on fluid pills. -He is currently on Eliquis and is tolerating well.  4.  Mild hyperkalemia: -Potassium today is 5.5.  We will give Kayexalate 30 g p.o. x1.     All questions were answered. The patient knows to call the clinic with any problems, questions or concerns.      Derek Jack, MD 10/20/19 7:00 PM

## 2019-10-20 NOTE — Assessment & Plan Note (Addendum)
1.  Mild to moderate thrombocytopenia: -Patient had mild to moderate thrombocytopenia ranging from 90-127 since April 2021.  Normal platelet count in March of this year. -No easy bruising or bleeding reported. -He was recently started on Eliquis for portal vein thrombosis. -US Doppler on 09/11/2019 shows spleen measuring 18 cm x 5.5 cm x 22 cm with a total volume of 1163 cm3. -We will repeat his CBC, and a review for various nutritional deficiencies, infectious causes and check an SPEP. -Differential diagnosis includes immune mediated thrombocytopenia versus thrombocytopenia from splenomegaly.  2.  Portal vein thrombosis: -Ultrasound Doppler of the liver on 09/11/2019 showed nonocclusive hyperechoic thrombus along the main portal vein wall posteriorly extending into the right portal vein. -Hepatic cirrhosis and ascites were seen.  I would not do any hypercoagulable testing at this time as they can be altered by anticoagulation.  3.  New onset cirrhosis and ascites: -Patient reported swelling in the stomach and feet which started about 2 months ago. -I have obtained and reviewed CT AP from [facility] dated 08/17/2019 which showed liver morphology consistent with cirrhosis with no masses.  Enlarged spleen measuring 22 cm.  Nonocclusive thrombus in the portal vein.  Prominent to mildly enlarged periceliac and gastrohepatic ligament lymph nodes.  3.5 cm left adrenal mass consistent with adenoma. -He is currently on Lasix 40 mg twice daily and spironolactone 100 mg daily. -He reports significant weight loss since he was started on fluid pills. -He is currently on Eliquis and is tolerating well.  4.  Mild hyperkalemia: -Potassium today is 5.5.  We will give Kayexalate 30 g p.o. x1.

## 2019-10-21 ENCOUNTER — Encounter (HOSPITAL_COMMUNITY): Payer: Self-pay | Admitting: *Deleted

## 2019-10-21 LAB — PROTEIN ELECTROPHORESIS, SERUM
A/G Ratio: 0.5 — ABNORMAL LOW (ref 0.7–1.7)
Albumin ELP: 2.8 g/dL — ABNORMAL LOW (ref 2.9–4.4)
Alpha-1-Globulin: 0.4 g/dL (ref 0.0–0.4)
Alpha-2-Globulin: 0.9 g/dL (ref 0.4–1.0)
Beta Globulin: 1.2 g/dL (ref 0.7–1.3)
Gamma Globulin: 2.5 g/dL — ABNORMAL HIGH (ref 0.4–1.8)
Globulin, Total: 5.1 g/dL — ABNORMAL HIGH (ref 2.2–3.9)
Total Protein ELP: 7.9 g/dL (ref 6.0–8.5)

## 2019-10-21 LAB — PATHOLOGIST SMEAR REVIEW

## 2019-10-21 LAB — H PYLORI, IGM, IGG, IGA AB
H Pylori IgG: 0.28 Index Value (ref 0.00–0.79)
H. Pylogi, Iga Abs: <9 units (ref 0.0–8.9)
H. Pylogi, Igm Abs: <9 units (ref 0.0–8.9)

## 2019-10-21 NOTE — Progress Notes (Signed)
Patient called today. I advised him that his potassium was high and Dr. Delton Coombes had called him in some medication to help bring it down.  He states that he will go pick it up today.

## 2019-10-22 LAB — COPPER, SERUM: Copper: 178 ug/dL — ABNORMAL HIGH (ref 69–132)

## 2019-10-25 LAB — METHYLMALONIC ACID, SERUM: Methylmalonic Acid, Quantitative: 294 nmol/L (ref 0–378)

## 2019-11-04 ENCOUNTER — Ambulatory Visit (INDEPENDENT_AMBULATORY_CARE_PROVIDER_SITE_OTHER): Payer: Self-pay | Admitting: General Surgery

## 2019-11-04 ENCOUNTER — Other Ambulatory Visit: Payer: Self-pay

## 2019-11-04 ENCOUNTER — Encounter: Payer: Self-pay | Admitting: General Surgery

## 2019-11-04 VITALS — BP 125/77 | HR 66 | Temp 98.1°F | Resp 14 | Ht 68.0 in | Wt 223.0 lb

## 2019-11-04 DIAGNOSIS — K4090 Unilateral inguinal hernia, without obstruction or gangrene, not specified as recurrent: Secondary | ICD-10-CM

## 2019-11-04 NOTE — Patient Instructions (Signed)

## 2019-11-05 NOTE — Progress Notes (Signed)
Alejandro Porter; [id number]; [birth year]   HPI Patient is a 44 year old white male who was sent to my care by the [facility] for evaluation treatment of a right inguinal hernia.  Patient has a known history of liver cirrhosis with esophageal varices recently treated several months ago by GI service.  He also has ascites and has undergone paracentesis in the past.  He states that when he is walking around, he notices right groin swelling.  It is somewhat uncomfortable.  As soon as he sits or lays down, he states the right groin swelling resolves.  He does state that his abdomen is more distended than usual.  He denies any pain in the right groin region. Past Medical History:  Diagnosis Date  . H/O colonoscopy   . H/O endoscopy   . Hypertension   . Liver cirrhosis ([place])   . Neuropathy   . S/P abdominal paracentesis     Past Surgical History:  Procedure Laterality Date  . BIOPSY  09/12/2019   Procedure: BIOPSY;  Surgeon: Daneil Dolin, MD;  [facility];  Service: Endoscopy;;  . COLONOSCOPY N/A 09/18/2019   Procedure: COLONOSCOPY;  Surgeon: Danie Binder, MD;  [facility]: [facility];  Service: Endoscopy;  Laterality: N/A;  . ESOPHAGEAL BANDING N/A 09/12/2019   Procedure: ESOPHAGEAL BANDING;  Surgeon: Daneil Dolin, MD;  [facility];  Service: Endoscopy;  Laterality: N/A;  . ESOPHAGOGASTRODUODENOSCOPY N/A 09/12/2019   Procedure: ESOPHAGOGASTRODUODENOSCOPY (EGD);  Surgeon: Daneil Dolin, MD;  [facility];  Service: Endoscopy;  Laterality: N/A;  . POLYPECTOMY  09/18/2019   Procedure: POLYPECTOMY;  Surgeon: Danie Binder, MD;  [facility]: [facility];  Service: Endoscopy;;    Family History  Problem Relation Age of Onset  . Brain cancer Mother   . Diabetes Mother   . Diabetes Father   . Pulmonary embolism Brother   . Liver disease Neg Hx     Current Outpatient Medications on File Prior to Visit  Medication Sig Dispense Refill   . apixaban (ELIQUIS) 5 MG TABS tablet Take 1 tablet (5 mg total) by mouth 2 (two) times daily. 60 tablet 5  . Cholecalciferol (VITAMIN D3) 125 MCG (5000 UT) TABS Take 1 tablet by mouth daily.    . Ferrous Sulfate (IRON) 325 (65 Fe) MG TABS Take 1 tablet (325 mg total) by mouth daily. 30 tablet 0  . furosemide (LASIX) 40 MG tablet Take 1 tablet (40 mg total) by mouth 2 (two) times daily. 60 tablet 3  . gabapentin (NEURONTIN) 300 MG capsule Take 1 capsule (300 mg total) by mouth at bedtime. 30 capsule 6  . lactulose (CHRONULAC) 10 GM/15ML solution Take 15 mLs (10 g total) by mouth daily. 473 mL 2  . Multiple Vitamin (MULTIVITAMIN) tablet Take 1 tablet by mouth daily.    Marland Kitchen omeprazole (PRILOSEC) 20 MG capsule Take 1 capsule (20 mg total) by mouth daily. 30 capsule 5  . propranolol (INDERAL) 20 MG tablet Take 1 tablet (20 mg total) by mouth 2 (two) times daily. 60 tablet 5  . spironolactone (ALDACTONE) 100 MG tablet Take 1 tablet (100 mg total) by mouth 2 (two) times daily with a meal. 60 tablet 2   No current facility-administered medications on file prior to visit.    Allergies  Allergen Reactions  . Chlorthalidone   . Metoprolol     Social History   Substance and Sexual Activity  Alcohol Use Never  Social History   Tobacco Use  Smoking Status Never Smoker  Smokeless Tobacco Never Used    Review of Systems  Constitutional: Negative.   HENT: Negative.   Respiratory: Negative.   Cardiovascular: Negative.   Gastrointestinal: Negative.   Genitourinary: Positive for frequency.  Musculoskeletal: Positive for neck pain.  Skin: Negative.   Neurological: Positive for dizziness, tremors and headaches.  Endo/Heme/Allergies: Negative.   Psychiatric/Behavioral: Negative.     Objective   Vitals:   11/04/19 1255  BP: 125/77  Pulse: 66  Resp: 14  Temp: 98.1 F (36.7 C)  SpO2: 98%    Physical Exam Vitals reviewed.  Constitutional:      Appearance: Normal appearance. He  is obese. He is not ill-appearing.  HENT:     Head: Normocephalic and atraumatic.  Cardiovascular:     Rate and Rhythm: Normal rate and regular rhythm.     Heart sounds: Normal heart sounds. No murmur. No friction rub. No gallop.   Pulmonary:     Effort: Pulmonary effort is normal. No respiratory distress.     Breath sounds: Normal breath sounds. No stridor. No wheezing, rhonchi or rales.  Abdominal:     General: There is distension.     Palpations: There is no mass.     Tenderness: There is no abdominal tenderness. There is no guarding or rebound.     Hernia: A hernia is present.     Comments: Patient has a distended abdomen which is somewhat tense.  A fluid wave is present.  He does have an easily reducible right inguinal hernia.  I suspect there is some fluid within this hernia.  Skin:    General: Skin is warm and dry.  Neurological:     Mental Status: He is alert and oriented to person, place, and time.   Health department notes reviewed  Assessment  Right inguinal hernia in the face of decompensated liver cirrhosis with portal hypertension, varices, and ascites.  Patient primarily is asymptomatic from his right inguinal hernia.  Given his liver cirrhosis, surgery is contraindicated at this point unless under emergent conditions.  This was explained to the patient. Plan   Signs and symptoms of incarceration of an inguinal hernia were given to the patient.  Follow-up here as needed.

## 2019-11-07 ENCOUNTER — Inpatient Hospital Stay (HOSPITAL_COMMUNITY): Payer: Medicaid Other

## 2019-11-07 ENCOUNTER — Encounter (HOSPITAL_COMMUNITY): Payer: Self-pay | Admitting: Hematology

## 2019-11-07 ENCOUNTER — Other Ambulatory Visit: Payer: Self-pay

## 2019-11-07 ENCOUNTER — Inpatient Hospital Stay (HOSPITAL_COMMUNITY): Payer: Medicaid Other | Attending: Hematology | Admitting: Hematology

## 2019-11-07 VITALS — BP 116/85 | HR 65 | Temp 98.3°F | Resp 20 | Wt 221.0 lb

## 2019-11-07 DIAGNOSIS — K7460 Unspecified cirrhosis of liver: Secondary | ICD-10-CM | POA: Insufficient documentation

## 2019-11-07 DIAGNOSIS — Z7901 Long term (current) use of anticoagulants: Secondary | ICD-10-CM | POA: Insufficient documentation

## 2019-11-07 DIAGNOSIS — E279 Disorder of adrenal gland, unspecified: Secondary | ICD-10-CM | POA: Diagnosis not present

## 2019-11-07 DIAGNOSIS — Z79899 Other long term (current) drug therapy: Secondary | ICD-10-CM | POA: Diagnosis not present

## 2019-11-07 DIAGNOSIS — I81 Portal vein thrombosis: Secondary | ICD-10-CM | POA: Diagnosis not present

## 2019-11-07 DIAGNOSIS — D696 Thrombocytopenia, unspecified: Secondary | ICD-10-CM

## 2019-11-07 DIAGNOSIS — E876 Hypokalemia: Secondary | ICD-10-CM | POA: Insufficient documentation

## 2019-11-07 DIAGNOSIS — R188 Other ascites: Secondary | ICD-10-CM | POA: Insufficient documentation

## 2019-11-07 DIAGNOSIS — I10 Essential (primary) hypertension: Secondary | ICD-10-CM | POA: Insufficient documentation

## 2019-11-07 LAB — BASIC METABOLIC PANEL
Anion gap: 9 (ref 5–15)
BUN: 45 mg/dL — ABNORMAL HIGH (ref 6–20)
CO2: 26 mmol/L (ref 22–32)
Calcium: 8.8 mg/dL — ABNORMAL LOW (ref 8.9–10.3)
Chloride: 94 mmol/L — ABNORMAL LOW (ref 98–111)
Creatinine, Ser: 1.9 mg/dL — ABNORMAL HIGH (ref 0.61–1.24)
GFR calc Af Amer: 48 mL/min — ABNORMAL LOW (ref >60)
GFR calc non Af Amer: 42 mL/min — ABNORMAL LOW (ref >60)
Glucose, Bld: 90 mg/dL (ref 70–99)
Potassium: 5.1 mmol/L (ref 3.5–5.1)
Sodium: 129 mmol/L — ABNORMAL LOW (ref 135–145)

## 2019-11-07 NOTE — Patient Instructions (Addendum)
[place] at [facility] Discharge Instructions  You were seen today by Dr. Delton Coombes. He went over your recent results. You will get labs drawn today. Please ask your gastroenterologist about your issues with swallowing and food getting stuck. Dr. Delton Coombes will see you back in 2 months for labs and follow up.   Thank you for choosing [place] at [facility] to provide your oncology and hematology care.  To afford each patient quality time with our provider, please arrive at least 15 minutes before your scheduled appointment time.   If you have a lab appointment with the [facility] please come in thru the  Main Entrance and check in at the main information desk  You need to re-schedule your appointment should you arrive 10 or more minutes late.  We strive to give you quality time with our providers, and arriving late affects you and other patients whose appointments are after yours.  Also, if you no show three or more times for appointments you may be dismissed from the clinic at the providers discretion.     Again, thank you for choosing [facility].  Our hope is that these requests will decrease the amount of time that you wait before being seen by our physicians.       _____________________________________________________________  Should you have questions after your visit to [facility], please contact our office at [phone] between the hours of 8:00 a.m. and 4:30 p.m.  Voicemails left after 4:00 p.m. will not be returned until the following business day.  For prescription refill requests, have your pharmacy contact our office and allow 72 hours.    Cancer Center Support Programs:   > Cancer Support Group  2nd Tuesday of the month 1pm-2pm, Journey Room

## 2019-11-07 NOTE — Progress Notes (Signed)
[place], [place] [address]   CLINIC:  Medical Oncology/Hematology  PCP:  Sofie Rower, PA-C [address] / [place] [address]  [phone]  REASON FOR VISIT:  Follow-up for thrombocytopenia and hyperkalemia  PRIOR THERAPY: None  CURRENT THERAPY: TBD  INTERVAL HISTORY:  [name], a 44 y.o. male, returns for routine follow-up for his thrombocytopenia and hyperkalemia. Alejandro Porter was last seen on 10/20/2019.  Denies any bleeding per rectum or melena.  Reports having some trouble swallowing.  He reports feeling well today. He will see his gastroenterologist on 12/08/2019. He sees his PCP every 6 months.   REVIEW OF SYSTEMS:  Review of Systems  Constitutional: Positive for appetite change (Moderately decreased) and fatigue (moderate).  HENT:   Positive for trouble swallowing.   Eyes: Positive for eye problems (double vision).  Cardiovascular: Negative for leg swelling.  Gastrointestinal: Positive for abdominal distention.  Neurological: Positive for dizziness and headaches.  Psychiatric/Behavioral: Positive for sleep disturbance.  All other systems reviewed and are negative.   PAST MEDICAL/SURGICAL HISTORY:  Past Medical History:  Diagnosis Date  . H/O colonoscopy   . H/O endoscopy   . Hypertension   . Liver cirrhosis ([place])   . Neuropathy   . S/P abdominal paracentesis    Past Surgical History:  Procedure Laterality Date  . BIOPSY  09/12/2019   Procedure: BIOPSY;  Surgeon: Daneil Dolin, MD;  [facility];  Service: Endoscopy;;  . COLONOSCOPY N/A 09/18/2019   Procedure: COLONOSCOPY;  Surgeon: Danie Binder, MD;  [facility];  Service: Endoscopy;  Laterality: N/A;  . ESOPHAGEAL BANDING N/A 09/12/2019   Procedure: ESOPHAGEAL BANDING;  Surgeon: Daneil Dolin, MD;  [facility];  Service: Endoscopy;  Laterality: N/A;  . ESOPHAGOGASTRODUODENOSCOPY N/A 09/12/2019   Procedure:  ESOPHAGOGASTRODUODENOSCOPY (EGD);  Surgeon: Daneil Dolin, MD;  [facility];  Service: Endoscopy;  Laterality: N/A;  . POLYPECTOMY  09/18/2019   Procedure: POLYPECTOMY;  Surgeon: Danie Binder, MD;  [facility];  Service: Endoscopy;;    SOCIAL HISTORY:  Social History   Socioeconomic History  . Marital status: Single    Spouse name: Not on file  . Number of children: Not on file  . Years of education: Not on file  . Highest education level: Not on file  Occupational History  . Occupation: umemployed  Tobacco Use  . Smoking status: Never Smoker  . Smokeless tobacco: Never Used  Substance and Sexual Activity  . Alcohol use: Never  . Drug use: Never  . Sexual activity: Not on file  Other Topics Concern  . Not on file  Social History Narrative  . Not on file   Social Determinants of Health   Financial Resource Strain:   . Difficulty of Paying Living Expenses:   Food Insecurity:   . Worried About Charity fundraiser in the Last Year:   . Arboriculturist in the Last Year:   Transportation Needs:   . Film/video editor (Medical):   Marland Kitchen Lack of Transportation (Non-Medical):   Physical Activity:   . Days of Exercise per Week:   . Minutes of Exercise per Session:   Stress:   . Feeling of Stress :   Social Connections:   . Frequency of Communication with Friends and Family:   . Frequency of Social Gatherings with Friends and Family:   . Attends Religious Services:   .  Active Member of Clubs or Organizations:   . Attends Archivist Meetings:   Marland Kitchen Marital Status:   Intimate Partner Violence:   . Fear of Current or Ex-Partner:   . Emotionally Abused:   Marland Kitchen Physically Abused:   . Sexually Abused:     FAMILY HISTORY:  Family History  Problem Relation Age of Onset  . Brain cancer Mother   . Diabetes Mother   . Diabetes Father   . Pulmonary embolism Brother   . Liver disease Neg Hx     CURRENT MEDICATIONS:  Current Outpatient  Medications  Medication Sig Dispense Refill  . apixaban (ELIQUIS) 5 MG TABS tablet Take 1 tablet (5 mg total) by mouth 2 (two) times daily. 60 tablet 5  . Cholecalciferol (VITAMIN D3) 125 MCG (5000 UT) TABS Take 1 tablet by mouth daily.    . Ferrous Sulfate (IRON) 325 (65 Fe) MG TABS Take 1 tablet (325 mg total) by mouth daily. 30 tablet 0  . furosemide (LASIX) 40 MG tablet Take 1 tablet (40 mg total) by mouth 2 (two) times daily. 60 tablet 3  . gabapentin (NEURONTIN) 300 MG capsule Take 1 capsule (300 mg total) by mouth at bedtime. 30 capsule 6  . lactulose (CHRONULAC) 10 GM/15ML solution Take 15 mLs (10 g total) by mouth daily. 473 mL 2  . Multiple Vitamin (MULTIVITAMIN) tablet Take 1 tablet by mouth daily.    Marland Kitchen omeprazole (PRILOSEC) 20 MG capsule Take 1 capsule (20 mg total) by mouth daily. 30 capsule 5  . propranolol (INDERAL) 20 MG tablet Take 1 tablet (20 mg total) by mouth 2 (two) times daily. 60 tablet 5  . spironolactone (ALDACTONE) 100 MG tablet Take 1 tablet (100 mg total) by mouth 2 (two) times daily with a meal. 60 tablet 2   No current facility-administered medications for this visit.    ALLERGIES:  Allergies  Allergen Reactions  . Chlorthalidone   . Metoprolol     PHYSICAL EXAM:  Performance status (ECOG): 1 - Symptomatic but completely ambulatory  Vitals:   11/07/19 1035  BP: 116/85  Pulse: 65  Resp: 20  Temp: 98.3 F (36.8 C)  SpO2: 100%   Wt Readings from Last 3 Encounters:  11/07/19 221 lb (100.2 kg)  11/04/19 223 lb (101.2 kg)  10/20/19 217 lb (98.4 kg)   Physical Exam Vitals reviewed.  Constitutional:      Appearance: Normal appearance. He is obese.  Cardiovascular:     Rate and Rhythm: Normal rate and regular rhythm.     Pulses: Normal pulses.     Heart sounds: Normal heart sounds.  Pulmonary:     Effort: Pulmonary effort is normal.     Breath sounds: Normal breath sounds.  Abdominal:     General: There is distension.  Musculoskeletal:      Right lower leg: No edema.     Left lower leg: No edema.  Neurological:     General: No focal deficit present.     Mental Status: He is alert and oriented to person, place, and time.  Psychiatric:        Mood and Affect: Mood normal.        Behavior: Behavior normal.     LABORATORY DATA:  I have reviewed the labs as listed.  CBC Latest Ref Rng & Units 10/20/2019 09/22/2019 09/21/2019  WBC 4.0 - 10.5 K/uL 14.1(H) 5.7 5.3  Hemoglobin 13.0 - 17.0 g/dL 12.8(L) 11.0(L) 10.7(L)  Hematocrit 39.0 - 52.0 %  39.2 35.2(L) 34.1(L)  Platelets 150 - 400 K/uL 323 127(L) 120(L)   CMP Latest Ref Rng & Units 10/20/2019 09/22/2019 09/18/2019  Glucose 70 - 99 mg/dL 110(H) 86 88  BUN 6 - 20 mg/dL 40(H) 16 15  Creatinine 0.61 - 1.24 mg/dL 1.64(H) 0.85 0.72  Sodium 135 - 145 mmol/L 124(L) 136 137  Potassium 3.5 - 5.1 mmol/L 5.5(H) 3.8 4.0  Chloride 98 - 111 mmol/L 90(L) 97(L) 100  CO2 22 - 32 mmol/L 24 28 26   Calcium 8.9 - 10.3 mg/dL 8.8(L) 9.1 9.1  Total Protein 6.5 - 8.1 g/dL 8.3(H) - 6.7  Total Bilirubin 0.3 - 1.2 mg/dL 0.8 - 0.6  Alkaline Phos 38 - 126 U/L 100 - 55  AST 15 - 41 U/L 23 - 22  ALT 0 - 44 U/L 23 - 16      Component Value Date/Time   RBC 4.36 10/20/2019 0855   MCV 89.9 10/20/2019 0855   MCH 29.4 10/20/2019 0855   MCHC 32.7 10/20/2019 0855   RDW 15.2 10/20/2019 0855   LYMPHSABS 0.7 10/20/2019 0855   MONOABS 1.3 (H) 10/20/2019 0855   EOSABS 0.1 10/20/2019 0855   BASOSABS 0.0 10/20/2019 0855    DIAGNOSTIC IMAGING:  I have independently reviewed the scans and discussed with the patient.   ASSESSMENT:  1.  Mild to moderate thrombocytopenia: -Mild to moderate thrombocytopenia ranging from 90-1 27 since April 2021. -SPEP was negative.  B12, methylmalonic acid, folic acid were normal.  Hepatitis B  was negative.  H. pylori was negative. -Thrombocytopenia resolved on recent CBC on 10/20/2019.  Platelet count was 323.  2.  Portal vein thrombosis: -Ultrasound Doppler of the liver on  09/11/2019 showed nonocclusive hyperechoic thrombus along the main portal vein wall posteriorly extending into the right portal vein. -He is on Eliquis and is tolerating well.  3.  New onset cirrhosis and ascites: -[facility] on 08[facility]17[facility]2019 showed liver morphology consistent with cirrhosis with no masses.  Enlarged spleen measuring 22 cm.  Nonocclusive thrombus in the portal vein.  Prominent to mildly enlarged periceliac and gastrohepatic ligament lymph nodes.  3.5 cm left adrenal mass consistent with adenoma.    PLAN:  1.  Mild to moderate thrombocytopenia: -This has resolved on the most recent CBC. -We will follow it up with CBC in 2 months.  2.  Portal vein thrombosis: -Continue Eliquis.  No bleeding reported.  3.  New onset cirrhosis and ascites: -Follow-up with GI. -Currently on Lasix 40 mg twice daily and spironolactone 100 mg daily. -He was told to follow-up with GI for difficulty swallowing.  4.  Mild hyperkalemia: -Potassium on last labs was elevated at 5.5.  Creatinine was 1.64.  We treated with Kayexalate. -I have counseled him to avoid potassium rich foods.  We will repeat potassium level today.   Orders placed this encounter:  No orders of the defined types were placed in this encounter.    Derek Jack, MD [place] [phone]   I, Milinda Antis, am acting as a scribe for Dr. Sanda Linger.  I, Derek Jack MD, have reviewed the above documentation for accuracy and completeness, and I agree with the above.

## 2019-12-02 ENCOUNTER — Encounter: Payer: Self-pay | Admitting: Nutrition

## 2019-12-02 ENCOUNTER — Encounter: Payer: Self-pay | Attending: Nurse Practitioner | Admitting: Nutrition

## 2019-12-02 ENCOUNTER — Other Ambulatory Visit: Payer: Self-pay

## 2019-12-02 VITALS — Ht 68.0 in | Wt 225.0 lb

## 2019-12-02 DIAGNOSIS — K766 Portal hypertension: Secondary | ICD-10-CM | POA: Insufficient documentation

## 2019-12-02 DIAGNOSIS — K7031 Alcoholic cirrhosis of liver with ascites: Secondary | ICD-10-CM | POA: Insufficient documentation

## 2019-12-02 NOTE — Progress Notes (Signed)
  Medical Nutrition Therapy:  Appt start time: 0800 end time:  0900.   Assessment:  Primary concerns today: Cirrhosis of liver, NASH.Marland Kitchen LIves with his dad. HIs mom is in a nursing home. He shops for his own groceries. He cooks mostly for himself. His Dad brought him for his appt. Usually uses the microwave to cook meals..  Eats 3 meals per day. Says he sometimes has problems swallowing and food gets stuck in is throat. He will talk with GI about that and his recent 15 lbs fluid weight gain. He notes short term memory issues at times. 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  He thinks he had his throat stretched  In April 2020.Alejandro Porter is up 15 lbs from last GI visit.  He is on gabapentin.  Motivated to work on [employer]. Use to eat a lot of processed and high salty foods but has changed his eating habits a lot. Never consumed alcohol. History of using Tylenol a lot for pain and headaches. Walks some every day.   Preferred Learning Style:  Auditory       Visual -not reading.  Hands on  Learning Readiness:   Ready  Change in progress   MEDICATIONS:   DIETARY INTAKE:  24-hr recall:  B ( AM):  Usually goes to [employer] for breakfast Snk ( AM):  L ( PM): Noodles, use Mrs Deliah Boston.  Or No Salt tomato sauce or some zoodles. Snk ( PM):  D ( PM): chicken, vegetables, water Snk ( PM): applesauce,  Beverages: water   Usual physical activity: walk some  Estimated energy needs: 1800-2000  calories 225 g carbohydrates 150  g protein 56 g fat  Progress Towards Goal(s):  In progress.   Nutritional Diagnosis:  NB-1.1 Food and nutrition-related knowledge deficit As related to Cirrhosis NASH.  As evidenced by Ascites.    Intervention:  Nutrition Cirrhosis education provided on My Plate, CHO counting, meal planning, portion sizes, timing of meals, Low salt diet, Cirrhosis Nutrition Therapy. Need for daily weights and calling MD with fluid weight gain. Low Potassium foods.  Goals  Choose  foods with less than 200 mg per serving of sodium Use steamable vegetables in bag without sauces Increase fresh fruits and vegetables. Boil potatoes to remove potassium Limit water to 3 bottles per day Call MD to discuss fluid weight gain. Avoid processed salty foods. Walk as tolerated.   Teaching Method Utilized:  Visual Auditory Hands on  Handouts given during visit include:  The Plate Method   Low Sodium Foods  Cirrhosis nutrition   Barriers to learning/adherence to lifestyle change: Cirrhosis.  Demonstrated degree of understanding via:  Teach Back   Monitoring/Evaluation:  Dietary intake, exercise, , and body weight in 1 month(s).

## 2019-12-02 NOTE — Patient Instructions (Signed)
Goals  Choose foods with less than 200 mg per serving of sodium Use steamable vegetables in bag without sauces Increase fresh fruits and vegetables. Boil potatoes to remove potassium Limit water to 3 bottles per day Call MD to discuss fluid weight gain. Avoid processed salty foods. Walk as tolerated.

## 2019-12-04 ENCOUNTER — Encounter: Payer: Self-pay | Admitting: Nurse Practitioner

## 2020-01-07 ENCOUNTER — Other Ambulatory Visit: Payer: Self-pay

## 2020-01-07 ENCOUNTER — Encounter: Payer: Medicaid Other | Attending: Physician Assistant | Admitting: Nutrition

## 2020-01-07 ENCOUNTER — Encounter: Payer: Self-pay | Admitting: Nutrition

## 2020-01-07 VITALS — Ht 68.0 in | Wt 227.0 lb

## 2020-01-07 DIAGNOSIS — E440 Moderate protein-calorie malnutrition: Secondary | ICD-10-CM | POA: Insufficient documentation

## 2020-01-07 DIAGNOSIS — K7031 Alcoholic cirrhosis of liver with ascites: Secondary | ICD-10-CM

## 2020-01-07 DIAGNOSIS — K766 Portal hypertension: Secondary | ICD-10-CM | POA: Insufficient documentation

## 2020-01-07 NOTE — Progress Notes (Signed)
Medical Nutrition Therapy:  Appt start time: 1300 end time:  1330   Assessment:  Primary concerns today: Cirrhosis of liver, NASH.Marland Kitchen LIves with his dad. HIs mom is in a nursing home. He shops for his own groceries. He cooks mostly for himself. His brother brought him for his appointment. Usually uses the microwave to cook meals..   He goes Aug 12 to hematologist. Gets blood work tomorrow. Appetite is same. Gained 10 lbs, most likely fluid. He notes his belly is getting more swollen. Appears to have Protein Calorie Malnutrition. Appears malnourished from cirrhosis.  But he notes he is having issues with memory. He can't remember a lot of things. His next GI visit is Sept 1st.  He has taken  Hep A and B vaccines.   He notes he has been has been waking up in his sheets wet and been sweating at night. This has been going for 2-3 weeks. Will follow up with GI. Has been using fresh meats and frozen vegetables and not using any salt or processed foods. HIs dad occasionally gets him a sausage biscuit in the mornings.  He appears to be malnourished-Protein Calorie Malnutrition secondary due to his NASH.. Face is thin, bony body prominents. Dry, frizzy hair, skin color pale. He has signs of muscle wasting. Cachetic appearance.  CMP Latest Ref Rng & Units 01/08/2020 11/07/2019 10/20/2019  Glucose 70 - 99 mg/dL 90 90 110(H)  BUN 6 - 20 mg/dL 35(H) 45(H) 40(H)  Creatinine 0.61 - 1.24 mg/dL 1.77(H) 1.90(H) 1.64(H)  Sodium 135 - 145 mmol/L 131(L) 129(L) 124(L)  Potassium 3.5 - 5.1 mmol/L 4.6 5.1 5.5(H)  Chloride 98 - 111 mmol/L 97(L) 94(L) 90(L)  CO2 22 - 32 mmol/L 25 26 24   Calcium 8.9 - 10.3 mg/dL 9.0 8.8(L) 8.8(L)  Total Protein 6.5 - 8.1 g/dL 8.5(H) - 8.3(H)  Total Bilirubin 0.3 - 1.2 mg/dL 0.6 - 0.8  Alkaline Phos 38 - 126 U/L 69 - 100  AST 15 - 41 U/L 21 - 23  ALT 0 - 44 U/L 14 - 23   Wt Readings from Last 3 Encounters:  01/15/20 226 lb (102.5 kg)  01/07/20 227 lb (103 kg)  12/02/19 225 lb (102.1  kg)   Ht Readings from Last 3 Encounters:  01/07/20 5' 8"  (1.727 m)  12/02/19 5' 8"  (1.727 m)  11/04/19 5' 8"  (1.727 m)   Body mass index is 34.52 kg/m. @BMIFA @ Facility age limit for growth percentiles is 20 years. Facility age limit for growth percentiles is 20 years.    Preferred Learning Style:  Auditory       Visual -not reading.  Hands on  Learning Readiness:   Ready  Change in progress   MEDICATIONS:   DIETARY INTAKE:  24-hr recall:  B ( AM):  Sausage biscuit, Or [place] bacon, 2 eggs and tomatoes,  Snk ( AM):  L ( PM): can't remember Snk ( PM):  D ( PM): chicken, vegetables, water Snk ( PM): applesauce,  Beverages: water   Usual physical activity: walk some  Estimated energy needs: 1800-2000  calories 225 g carbohydrates 150  g protein 56 g fat  Progress Towards Goal(s):  In progress.   Nutritional Diagnosis:  NB-1.1 Food and nutrition-related knowledge deficit As related to Cirrhosis NASH.  As evidenced by Ascites.    Intervention:  Nutrition Cirrhosis education provided on My Plate, CHO counting, meal planning, portion sizes, timing of meals, Low salt diet, Cirrhosis Nutrition Therapy. Need for daily weights and calling  MD with fluid weight gain. Low Potassium foods.  Goals  Choose foods with less than 200 mg per serving of sodium Use steamable vegetables in bag without sauces Increase fresh fruits and vegetables. Boil potatoes to remove potassium Limit water to 3 bottles per day per your fluid restrictions. Call MD to discuss fluid weight gain. Avoid processed salty foods. Walk as tolerated. Increasee quality protein. May need to consider drinking Carnation Instant Breakfast 1-2 times per day.    Teaching Method Utilized:  Visual Auditory Hands on  Handouts given during visit include:  The Plate Method   Low Sodium Foods  Cirrhosis nutrition   Barriers to learning/adherence to lifestyle change: Cirrhosis.  Demonstrated  degree of understanding via:  Teach Back   Monitoring/Evaluation:  Dietary intake, exercise, , and body weight in 1 month(s). Recommend to check a prealbumin level. Supplement with Ensure Protein 1-2 times per day.

## 2020-01-07 NOTE — Patient Instructions (Addendum)
Goals  Choose foods with less than 200 mg per serving of sodium Use steamable vegetables in bag without sauces Increase fresh fruits and vegetables. Boil potatoes to remove potassium Limit water to 3 bottles per day per your fluid restrictions. Call MD to discuss fluid weight gain. Avoid processed salty foods. Walk as tolerated. Increasee quality protein but not excessive amounts of protein. May need to consider drinking Carnation Instant Breakfast 1-2 times per day.

## 2020-01-08 ENCOUNTER — Ambulatory Visit: Payer: Self-pay | Admitting: Nurse Practitioner

## 2020-01-08 ENCOUNTER — Inpatient Hospital Stay (HOSPITAL_COMMUNITY): Payer: Medicaid Other | Attending: Hematology

## 2020-01-08 DIAGNOSIS — Z79899 Other long term (current) drug therapy: Secondary | ICD-10-CM | POA: Insufficient documentation

## 2020-01-08 DIAGNOSIS — D696 Thrombocytopenia, unspecified: Secondary | ICD-10-CM

## 2020-01-08 DIAGNOSIS — E279 Disorder of adrenal gland, unspecified: Secondary | ICD-10-CM | POA: Diagnosis not present

## 2020-01-08 DIAGNOSIS — Z7901 Long term (current) use of anticoagulants: Secondary | ICD-10-CM | POA: Insufficient documentation

## 2020-01-08 DIAGNOSIS — E875 Hyperkalemia: Secondary | ICD-10-CM | POA: Diagnosis not present

## 2020-01-08 DIAGNOSIS — I10 Essential (primary) hypertension: Secondary | ICD-10-CM | POA: Insufficient documentation

## 2020-01-08 DIAGNOSIS — I81 Portal vein thrombosis: Secondary | ICD-10-CM | POA: Diagnosis not present

## 2020-01-08 LAB — CBC WITH DIFFERENTIAL/PLATELET
Abs Immature Granulocytes: 0.01 K/uL (ref 0.00–0.07)
Basophils Absolute: 0 K/uL (ref 0.0–0.1)
Basophils Relative: 1 %
Eosinophils Absolute: 0.2 K/uL (ref 0.0–0.5)
Eosinophils Relative: 2 %
HCT: 35.4 % — ABNORMAL LOW (ref 39.0–52.0)
Hemoglobin: 11.6 g/dL — ABNORMAL LOW (ref 13.0–17.0)
Immature Granulocytes: 0 %
Lymphocytes Relative: 9 %
Lymphs Abs: 0.6 K/uL — ABNORMAL LOW (ref 0.7–4.0)
MCH: 30.3 pg (ref 26.0–34.0)
MCHC: 32.8 g/dL (ref 30.0–36.0)
MCV: 92.4 fL (ref 80.0–100.0)
Monocytes Absolute: 0.8 K/uL (ref 0.1–1.0)
Monocytes Relative: 11 %
Neutro Abs: 5.3 K/uL (ref 1.7–7.7)
Neutrophils Relative %: 77 %
Platelets: 158 K/uL (ref 150–400)
RBC: 3.83 MIL/uL — ABNORMAL LOW (ref 4.22–5.81)
RDW: 15.6 % — ABNORMAL HIGH (ref 11.5–15.5)
WBC: 6.8 K/uL (ref 4.0–10.5)
nRBC: 0 % (ref 0.0–0.2)

## 2020-01-08 LAB — COMPREHENSIVE METABOLIC PANEL WITH GFR
ALT: 14 U/L (ref 0–44)
AST: 21 U/L (ref 15–41)
Albumin: 3.4 g/dL — ABNORMAL LOW (ref 3.5–5.0)
Alkaline Phosphatase: 69 U/L (ref 38–126)
Anion gap: 9 (ref 5–15)
BUN: 35 mg/dL — ABNORMAL HIGH (ref 6–20)
CO2: 25 mmol/L (ref 22–32)
Calcium: 9 mg/dL (ref 8.9–10.3)
Chloride: 97 mmol/L — ABNORMAL LOW (ref 98–111)
Creatinine, Ser: 1.77 mg/dL — ABNORMAL HIGH (ref 0.61–1.24)
GFR calc Af Amer: 53 mL/min — ABNORMAL LOW (ref >60)
GFR calc non Af Amer: 45 mL/min — ABNORMAL LOW (ref >60)
Glucose, Bld: 90 mg/dL (ref 70–99)
Potassium: 4.6 mmol/L (ref 3.5–5.1)
Sodium: 131 mmol/L — ABNORMAL LOW (ref 135–145)
Total Bilirubin: 0.6 mg/dL (ref 0.3–1.2)
Total Protein: 8.5 g/dL — ABNORMAL HIGH (ref 6.5–8.1)

## 2020-01-15 ENCOUNTER — Other Ambulatory Visit: Payer: Self-pay

## 2020-01-15 ENCOUNTER — Inpatient Hospital Stay (HOSPITAL_BASED_OUTPATIENT_CLINIC_OR_DEPARTMENT_OTHER): Payer: Medicaid Other | Admitting: Hematology

## 2020-01-15 VITALS — BP 117/81 | HR 63 | Temp 97.3°F | Resp 17 | Wt 226.0 lb

## 2020-01-15 DIAGNOSIS — D696 Thrombocytopenia, unspecified: Secondary | ICD-10-CM

## 2020-01-15 NOTE — Patient Instructions (Signed)
[place] at [facility] Discharge Instructions  You were seen today by Dr. Delton Coombes. He went over your recent results. Dr. Delton Coombes will see you back in 6 months for labs and follow up.   Thank you for choosing [place] at [facility] to provide your oncology and hematology care.  To afford each patient quality time with our provider, please arrive at least 15 minutes before your scheduled appointment time.   If you have a lab appointment with the [facility] please come in thru the Main Entrance and check in at the main information desk  You need to re-schedule your appointment should you arrive 10 or more minutes late.  We strive to give you quality time with our providers, and arriving late affects you and other patients whose appointments are after yours.  Also, if you no show three or more times for appointments you may be dismissed from the clinic at the providers discretion.     Again, thank you for choosing [facility].  Our hope is that these requests will decrease the amount of time that you wait before being seen by our physicians.       _____________________________________________________________  Should you have questions after your visit to [facility], please contact our office at [phone] between the hours of 8:00 a.m. and 4:30 p.m.  Voicemails left after 4:00 p.m. will not be returned until the following business day.  For prescription refill requests, have your pharmacy contact our office and allow 72 hours.    Cancer Center Support Programs:   > Cancer Support Group  2nd Tuesday of the month 1pm-2pm, Journey Room

## 2020-01-15 NOTE — Progress Notes (Signed)
[place], [place] [address]   CLINIC:  Medical Oncology/Hematology  PCP:  Sofie Rower, PA-C [address] / [place] [id number]  [phone]  REASON FOR VISIT:  Follow-up for thrombocytopenia and hyperkalemia  PRIOR THERAPY: None  CURRENT THERAPY: Under work-up  INTERVAL HISTORY:  Alejandro Porter, a 44 y.o. male, returns for routine follow-up for his thrombocytopenia and hyperkalemia. Huie was last seen on 11/07/2019.  Today he reports that he has been hitting his head and his chest due to dizziness and is now having headaches and chest pain. He denies having hematochezia or hematuria.  He is scheduled to see Dr. Gordy Levan on 9/1.   REVIEW OF SYSTEMS:  Review of Systems  Constitutional: Positive for appetite change (moderately decreased) and fatigue (severe).  HENT:   Positive for trouble swallowing.   Cardiovascular: Positive for chest pain.  Gastrointestinal: Positive for abdominal pain. Negative for blood in stool.  Genitourinary: Negative for hematuria.   Neurological: Positive for dizziness and headaches.  Psychiatric/Behavioral: Positive for sleep disturbance.  All other systems reviewed and are negative.   PAST MEDICAL/SURGICAL HISTORY:  Past Medical History:  Diagnosis Date  . H/O colonoscopy   . H/O endoscopy   . Hypertension   . Liver cirrhosis ([place] [facility])   . Neuropathy   . S/P abdominal paracentesis    Past Surgical History:  Procedure Laterality Date  . BIOPSY  09/12/2019   Procedure: BIOPSY;  Surgeon: Daneil Dolin, MD;  [facility];  Service: Endoscopy;;  . COLONOSCOPY N/A 09/18/2019   Procedure: COLONOSCOPY;  Surgeon: Danie Binder, MD;  [facility];  Service: Endoscopy;  Laterality: N/A;  . ESOPHAGEAL BANDING N/A 09/12/2019   Procedure: ESOPHAGEAL BANDING;  Surgeon: Daneil Dolin, MD;  [facility];  Service: Endoscopy;  Laterality: N/A;  .  ESOPHAGOGASTRODUODENOSCOPY N/A 09/12/2019   Procedure: ESOPHAGOGASTRODUODENOSCOPY (EGD);  Surgeon: Daneil Dolin, MD;  [facility];  Service: Endoscopy;  Laterality: N/A;  . POLYPECTOMY  09/18/2019   Procedure: POLYPECTOMY;  Surgeon: Danie Binder, MD;  [facility];  Service: Endoscopy;;    SOCIAL HISTORY:  Social History   Socioeconomic History  . Marital status: Single    Spouse name: Not on file  . Number of children: Not on file  . Years of education: Not on file  . Highest education level: Not on file  Occupational History  . Occupation: umemployed  Tobacco Use  . Smoking status: Never Smoker  . Smokeless tobacco: Never Used  Substance and Sexual Activity  . Alcohol use: Never  . Drug use: Never  . Sexual activity: Not on file  Other Topics Concern  . Not on file  Social History Narrative  . Not on file   Social Determinants of Health   Financial Resource Strain:   . Difficulty of Paying Living Expenses:   Food Insecurity:   . Worried About Charity fundraiser in the Last Year:   . Arboriculturist in the Last Year:   Transportation Needs:   . Film/video editor (Medical):   Marland Kitchen Lack of Transportation (Non-Medical):   Physical Activity:   . Days of Exercise per Week:   . Minutes of Exercise per Session:   Stress:   . Feeling of Stress :   Social Connections:   . Frequency of Communication with Friends and Family:   . Frequency of Social  Gatherings with Friends and Family:   . Attends Religious Services:   . Active Member of Clubs or Organizations:   . Attends Archivist Meetings:   Marland Kitchen Marital Status:   Intimate Partner Violence:   . Fear of Current or Ex-Partner:   . Emotionally Abused:   Marland Kitchen Physically Abused:   . Sexually Abused:     FAMILY HISTORY:  Family History  Problem Relation Age of Onset  . Brain cancer Mother   . Diabetes Mother   . Diabetes Father   . Pulmonary embolism Brother   . Liver disease Neg Hx      CURRENT MEDICATIONS:  Current Outpatient Medications  Medication Sig Dispense Refill  . apixaban (ELIQUIS) 5 MG TABS tablet Take 1 tablet (5 mg total) by mouth 2 (two) times daily. 60 tablet 5  . Cholecalciferol (VITAMIN D3) 125 MCG (5000 UT) TABS Take 1 tablet by mouth daily.    . Ferrous Sulfate (IRON) 325 (65 Fe) MG TABS Take 1 tablet (325 mg total) by mouth daily. 30 tablet 0  . furosemide (LASIX) 40 MG tablet Take 1 tablet (40 mg total) by mouth 2 (two) times daily. 60 tablet 3  . gabapentin (NEURONTIN) 300 MG capsule Take 1 capsule (300 mg total) by mouth at bedtime. 30 capsule 6  . lactulose (CHRONULAC) 10 GM/15ML solution Take 15 mLs (10 g total) by mouth daily. 473 mL 2  . Multiple Vitamin (MULTIVITAMIN) tablet Take 1 tablet by mouth daily.    Marland Kitchen omeprazole (PRILOSEC) 20 MG capsule Take 1 capsule (20 mg total) by mouth daily. 30 capsule 5  . propranolol (INDERAL) 20 MG tablet Take 1 tablet (20 mg total) by mouth 2 (two) times daily. 60 tablet 5  . spironolactone (ALDACTONE) 100 MG tablet Take 1 tablet (100 mg total) by mouth 2 (two) times daily with a meal. 60 tablet 2   No current facility-administered medications for this visit.    ALLERGIES:  Allergies  Allergen Reactions  . Chlorthalidone   . Metoprolol     PHYSICAL EXAM:  Performance status (ECOG): 1 - Symptomatic but completely ambulatory  Vitals:   01/15/20 1518  BP: 117/81  Pulse: 63  Resp: 17  Temp: (!) 97.3 F (36.3 C)  SpO2: 100%   Wt Readings from Last 3 Encounters:  01/15/20 226 lb (102.5 kg)  01/07/20 227 lb (103 kg)  12/02/19 225 lb (102.1 kg)   Physical Exam Vitals reviewed.  Constitutional:      Appearance: Normal appearance. He is obese.  Cardiovascular:     Rate and Rhythm: Normal rate and regular rhythm.     Pulses: Normal pulses.     Heart sounds: Normal heart sounds.  Pulmonary:     Effort: Pulmonary effort is normal.     Breath sounds: Normal breath sounds.  Abdominal:      General: There is distension.     Palpations: Abdomen is soft. There is no mass.     Tenderness: There is no abdominal tenderness.  Musculoskeletal:     Right lower leg: No edema.     Left lower leg: No edema.  Neurological:     General: No focal deficit present.     Mental Status: He is alert and oriented to person, place, and time.  Psychiatric:        Mood and Affect: Mood normal.        Behavior: Behavior normal.     LABORATORY DATA:  I have reviewed the  labs as listed.  CBC Latest Ref Rng & Units 01/08/2020 10/20/2019 09/22/2019  WBC 4.0 - 10.5 K/uL 6.8 14.1(H) 5.7  Hemoglobin 13.0 - 17.0 g/dL 11.6(L) 12.8(L) 11.0(L)  Hematocrit 39 - 52 % 35.4(L) 39.2 35.2(L)  Platelets 150 - 400 K/uL 158 323 127(L)   CMP Latest Ref Rng & Units 01/08/2020 11/07/2019 10/20/2019  Glucose 70 - 99 mg/dL 90 90 110(H)  BUN 6 - 20 mg/dL 35(H) 45(H) 40(H)  Creatinine 0.61 - 1.24 mg/dL 1.77(H) 1.90(H) 1.64(H)  Sodium 135 - 145 mmol/L 131(L) 129(L) 124(L)  Potassium 3.5 - 5.1 mmol/L 4.6 5.1 5.5(H)  Chloride 98 - 111 mmol/L 97(L) 94(L) 90(L)  CO2 22 - 32 mmol/L 25 26 24   Calcium 8.9 - 10.3 mg/dL 9.0 8.8(L) 8.8(L)  Total Protein 6.5 - 8.1 g/dL 8.5(H) - 8.3(H)  Total Bilirubin 0.3 - 1.2 mg/dL 0.6 - 0.8  Alkaline Phos 38 - 126 U/L 69 - 100  AST 15 - 41 U/L 21 - 23  ALT 0 - 44 U/L 14 - 23      Component Value Date/Time   RBC 3.83 (L) 01/08/2020 1401   MCV 92.4 01/08/2020 1401   MCH 30.3 01/08/2020 1401   MCHC 32.8 01/08/2020 1401   RDW 15.6 (H) 01/08/2020 1401   LYMPHSABS 0.6 (L) 01/08/2020 1401   MONOABS 0.8 01/08/2020 1401   EOSABS 0.2 01/08/2020 1401   BASOSABS 0.0 01/08/2020 1401    DIAGNOSTIC IMAGING:  I have independently reviewed the scans and discussed with the patient. No results found.   ASSESSMENT:  1. Mild to moderate thrombocytopenia: -Mild to moderate thrombocytopenia ranging from 90-1 27 since April 2021. -SPEP was negative.  B12, methylmalonic acid, folic acid were normal.   Hepatitis B  was negative.  H. pylori was negative. -Thrombocytopenia resolved on recent CBC on 10/20/2019.  Platelet count was 323.  2.  Portal vein thrombosis: -Ultrasound Doppler of the liver on 09/11/2019 showed nonocclusive hyperechoic thrombus along the main portal vein wall posteriorly extending into the right portal vein. -He is on Eliquis and is tolerating well.  3.  New onset cirrhosis and ascites: -CTAP from [facility] on 08/17/2019 showed liver morphology consistent with cirrhosis with no masses.  Enlarged spleen measuring 22 cm.  Nonocclusive thrombus in the portal vein.  Prominent to mildly enlarged periceliac and gastrohepatic ligament lymph nodes.  3.5 cm left adrenal mass consistent with adenoma.   PLAN:  1. Mild to moderate thrombocytopenia: -Last 2 CBCs showed normal platelet count.  Platelet count today is 158.  No further work-up needed. -RTC 6 months with labs.  2. Portal vein thrombosis: -Continue Eliquis.  No bleeding reported.  3. New onset cirrhosis and ascites: -Continue Lasix and spironolactone.  Follow-up with GI.  4. Mild hyperkalemia: -Potassium today is 4.6.  Continue to avoid potassium rich foods.  Orders placed this encounter:  No orders of the defined types were placed in this encounter.    Derek Jack, MD [place] [phone]   I, Milinda Antis, am acting as a scribe for Dr. Sanda Linger.  I, Derek Jack MD, have reviewed the above documentation for accuracy and completeness, and I agree with the above.

## 2020-01-27 ENCOUNTER — Encounter: Payer: Self-pay | Admitting: Nutrition

## 2020-02-04 ENCOUNTER — Ambulatory Visit (INDEPENDENT_AMBULATORY_CARE_PROVIDER_SITE_OTHER): Payer: Medicaid Other | Admitting: Nurse Practitioner

## 2020-02-04 ENCOUNTER — Other Ambulatory Visit: Payer: Self-pay

## 2020-02-04 ENCOUNTER — Encounter: Payer: Self-pay | Admitting: Nurse Practitioner

## 2020-02-04 VITALS — BP 126/89 | HR 73 | Temp 97.6°F | Ht 68.0 in | Wt 225.2 lb

## 2020-02-04 DIAGNOSIS — K7031 Alcoholic cirrhosis of liver with ascites: Secondary | ICD-10-CM | POA: Diagnosis not present

## 2020-02-04 DIAGNOSIS — I81 Portal vein thrombosis: Secondary | ICD-10-CM

## 2020-02-04 DIAGNOSIS — K5900 Constipation, unspecified: Secondary | ICD-10-CM | POA: Diagnosis not present

## 2020-02-04 DIAGNOSIS — K766 Portal hypertension: Secondary | ICD-10-CM

## 2020-02-04 MED ORDER — LACTULOSE 10 GM/15ML PO SOLN: 10.0000 g | Freq: Every day | ORAL | 5 refills | Status: DC

## 2020-02-04 MED ORDER — RIFAXIMIN 550 MG PO TABS: 550.0000 mg | ORAL_TABLET | Freq: Two times a day (BID) | ORAL | 5 refills | Status: DC

## 2020-02-04 NOTE — Progress Notes (Signed)
Referring Provider: Health, [facility]* Primary Care Physician:  Sofie Rower, PA-C Primary GI:  Dr. Gala Romney  Chief Complaint  Patient presents with   Cirrhosis    ascites, abd pain, hernia, constipation    HPI:   Alejandro Porter is a 44 y.o. male who presents for follow-up on cirrhosis and ascites.  The patient last seen in our office 10/08/2019 for cirrhosis, ascites, PVT, portal hypertension.  At that time noted recently diagnosed cirrhosis with severe ascites and anasarca requiring hospitalization for approximately 1-1/2 weeks.  He developed portal venous thrombus and has been placed on anticoagulation.  Since hospital discharge his ascites had significantly improved, he seems very compliant and committed to healthy lifestyle including low-salt diet.  He seems compliant with his medications.  He is still requiring hepatitis a and B vaccines, EGD during admission found grade 1/grade 2 esophageal varices and is on beta-blockade with a heart rate near goal of 66.  Most recent meld score is 8, child Pugh a with a caveat of no ascites today.  Recommended 69-monthfollow-up and eventual 6 months routine liver care when he is stable.  Today he states he is doing well overall. He has hit his head a few times recently (mostly on the car door; generally misjudging distances rather than dizziness/syncope). He has been bumping his extremities and getting bruises. He has been having constipation recently; having a bowel movement every 4 days, hard stools, and straining. Has been having more frequent leg cramps. Some intermittent abdominal pain and tightness, pain improves at times with a bowel movement. Some bloating. Denies N/V, hematochezia. He is having dark stools on iron. Denies fever, chills, unintentional weight loss. Denies any yellowing of the skin/eyes, darkened urine, acute episodic confusion but is having short term memory issues (which is new for him). He denies generalized tremors,  generalized pruritis. Denies worsening swelling in his lower extremities.  He got his Hepatitis A & B vaccines, only has last shot in the series left. He is taking lactulose bid. He's been trying to eat low sodium, doesn't generally track sodium intake but makes an effort to eat salads and other naturally low-salt foods.  Past Medical History:  Diagnosis Date   H/O colonoscopy    H/O endoscopy    Hypertension    Liver cirrhosis ([facility])    Neuropathy    S/P abdominal paracentesis     Past Surgical History:  Procedure Laterality Date   BIOPSY  09/12/2019   Procedure: BIOPSY;  Surgeon: RDaneil Dolin MD;  [facility];  Service: Endoscopy;;   COLONOSCOPY N/A 09/18/2019   Procedure: COLONOSCOPY;  Surgeon: FDanie Binder MD;  [facility];  Service: Endoscopy;  Laterality: N/A;   ESOPHAGEAL BANDING N/A 09/12/2019   Procedure: ESOPHAGEAL BANDING;  Surgeon: RDaneil Dolin MD;  [facility];  Service: Endoscopy;  Laterality: N/A;   ESOPHAGOGASTRODUODENOSCOPY N/A 09/12/2019   Procedure: ESOPHAGOGASTRODUODENOSCOPY (EGD);  Surgeon: RDaneil Dolin MD;  [facility];  Service: Endoscopy;  Laterality: N/A;   POLYPECTOMY  09/18/2019   Procedure: POLYPECTOMY;  Surgeon: FDanie Binder MD;  [facility];  Service: Endoscopy;;    Current Outpatient Medications  Medication Sig Dispense Refill   apixaban (ELIQUIS) 5 MG TABS tablet Take 1 tablet (5 mg total) by mouth 2 (two) times daily. 60 tablet 5   Cholecalciferol (VITAMIN D3) 125 MCG (5000 UT) TABS Take 1 tablet by mouth daily.  Ferrous Sulfate (IRON) 325 (65 Fe) MG TABS Take 1 tablet (325 mg total) by mouth daily. 30 tablet 0   furosemide (LASIX) 40 MG tablet Take 1 tablet (40 mg total) by mouth 2 (two) times daily. 60 tablet 3   gabapentin (NEURONTIN) 300 MG capsule Take 1 capsule (300 mg total) by mouth at bedtime. 30 capsule 6   lactulose (CHRONULAC) 10 GM/15ML solution  Take 15 mLs (10 g total) by mouth daily. 473 mL 2   Multiple Vitamin (MULTIVITAMIN) tablet Take 1 tablet by mouth daily.     omeprazole (PRILOSEC) 20 MG capsule Take 1 capsule (20 mg total) by mouth daily. 30 capsule 5   propranolol (INDERAL) 20 MG tablet Take 1 tablet (20 mg total) by mouth 2 (two) times daily. 60 tablet 5   spironolactone (ALDACTONE) 100 MG tablet Take 1 tablet (100 mg total) by mouth 2 (two) times daily with a meal. 60 tablet 2   No current facility-administered medications for this visit.    Allergies as of 02/04/2020 - Review Complete 02/04/2020  Allergen Reaction Noted   Chlorthalidone  11/04/2019   Metoprolol  11/04/2019    Family History  Problem Relation Age of Onset   Brain cancer Mother    Diabetes Mother    Diabetes Father    Pulmonary embolism Brother    Liver disease Neg Hx     Social History   Socioeconomic History   Marital status: Single    Spouse name: Not on file   Number of children: Not on file   Years of education: Not on file   Highest education level: Not on file  Occupational History   Occupation: umemployed  Tobacco Use   Smoking status: Never Smoker   Smokeless tobacco: Never Used  Substance and Sexual Activity   Alcohol use: Never   Drug use: Never   Sexual activity: Not on file  Other Topics Concern   Not on file  Social History Narrative   Not on file   Social Determinants of Health   Financial Resource Strain:    Difficulty of Paying Living Expenses: Not on file  Food Insecurity:    Worried About Charity fundraiser in the Last Year: Not on file   [place] in the Last Year: Not on file  Transportation Needs:    Lack of Transportation (Medical): Not on file   Lack of Transportation (Non-Medical): Not on file  Physical Activity:    Days of Exercise per Week: Not on file   Minutes of Exercise per Session: Not on file  Stress:    Feeling of Stress : Not on file  Social  Connections:    Frequency of Communication with Friends and Family: Not on file   Frequency of Social Gatherings with Friends and Family: Not on file   Attends Religious Services: Not on file   Active Member of Clubs or Organizations: Not on file   Attends Archivist Meetings: Not on file   Marital Status: Not on file    Subjective: Review of Systems  Constitutional: Negative for chills, fever, malaise/fatigue and weight loss.  HENT: Negative for congestion and sore throat.   Respiratory: Negative for cough and shortness of breath.   Cardiovascular: Negative for chest pain and palpitations.  Gastrointestinal: Negative for abdominal pain, blood in stool, diarrhea, melena, nausea and vomiting.  Musculoskeletal: Negative for joint pain and myalgias.  Skin: Negative for rash.  Neurological: Negative for dizziness and weakness.  Endo/Heme/Allergies: Does not bruise/bleed easily.  Psychiatric/Behavioral: Negative for depression. The patient is not nervous/anxious.   All other systems reviewed and are negative.    Objective: BP 126/89    Pulse 73    Temp 97.6 F (36.4 C) (Oral)    Ht 5' 8"  (1.727 m)    Wt 225 lb 3.2 oz (102.2 kg)    BMI 34.24 kg/m  Physical Exam Vitals and nursing note reviewed.  Constitutional:      General: He is not in acute distress.    Appearance: Normal appearance. He is not ill-appearing, toxic-appearing or diaphoretic.  HENT:     Head: Normocephalic and atraumatic.     Nose: No congestion or rhinorrhea.  Eyes:     General: No scleral icterus. Cardiovascular:     Rate and Rhythm: Normal rate and regular rhythm.     Heart sounds: Normal heart sounds.  Pulmonary:     Effort: Pulmonary effort is normal.     Breath sounds: Normal breath sounds.  Abdominal:     General: Bowel sounds are normal. There is no distension.     Palpations: Abdomen is soft. There is no hepatomegaly, splenomegaly or mass.     Tenderness: There is no abdominal  tenderness. There is no guarding or rebound.     Hernia: No hernia is present.  Musculoskeletal:     Cervical back: Neck supple.  Skin:    General: Skin is warm and dry.     Coloration: Skin is not jaundiced.     Findings: No bruising or rash.  Neurological:     General: No focal deficit present.     Mental Status: He is alert and oriented to person, place, and time. Mental status is at baseline.  Psychiatric:        Mood and Affect: Mood normal.        Behavior: Behavior normal.        Thought Content: Thought content normal.      Assessment:  Very pleasant 44 year old male who is familiar to our practice with recently diagnosed cirrhosis, portal hypertension with thrombocytopenia and grade 1/grade 2 esophageal varices currently on nonspecific beta-blockade.  Also with portal vein thrombus followed by hematology on Eliquis which is stable per their last office visit.  He presents today also complaining of constipation.  He is taking lactulose twice a day and is having a bowel movement every 3 to 4 days.  Some memory issues recently as well.  We had a lengthy discussion about increasing his lactulose with a goal for 3-4 soft bowel movements a day.  Now that he has Medicaid I will attempt to send in Xifaxan to see if we can get some better prophylaxis for hepatic encephalopathy.  He states he is currently eating a low-salt diet and I commended him for this and recommend he continue.  He is due for cirrhosis surveillance labs and hepatoma screening.  I am somewhat concerned that in the past 3 months his creatinine has been bumped a little (1.7-1.9).  No lower extremity edema, but does appear to have some ascites on board although he denies any symptoms from this.  I will not proceed with paracentesis at this time as he is not symptomatic.  I am hesitant to increase his diuretics until we can check his electrolytes and kidney function.  If his creatinine remains elevated he would likely benefit  from a referral to nephrology to help with diuresis management in relation to kidney function.  Further recommendations will follow   Plan: 1. Increase lactulose with goal of 3-4 soft bowel movements a day 2. Xifaxan 550 mg twice daily for hepatic encephalopathy prophylaxis 3. Continue low-salt diet 4. CBC, CMP, INR, AFP 5. Right upper quadrant ultrasound with ascites check as well 6. May require nephrology referral, pending lab results 7. Follow-up in 2 months 8. Call for any worsening or significant symptoms   Thank you for allowing [place] to participate in the care of Alejandro Onnie Graham, DNP, AGNP-C Adult & Gerontological Nurse Practitioner [facility]   02/04/2020 4:23 PM   Disclaimer: This note was dictated with voice recognition software. Similar sounding words can inadvertently be transcribed and may not be corrected upon review.

## 2020-02-04 NOTE — Patient Instructions (Addendum)
Your health issues we discussed today were:   Cirrhosis: 1. As we discussed, increase the lactulose that you are taking.  You can either increase the amount you take each time or increase the number of times a day you take it.  Your goal is to have 3-4 soft bowel movements a day 2. I will send a prescription for a new medication called Xifaxan to help with confusion to your pharmacy.  We will see if your insurance will pay for it. 3. Continue to eat a low-salt diet 4. Have your labs drawn when you are able to 5. We will schedule your ultrasound of your liver for you 6. Call us for any worsening or significant symptoms 7. Otherwise, I am not going to make any other changes to your medications right now  Overall I recommend:  1. Continue your other current medications 2. Return for follow-up in 2 months 3. Call us for any questions or concerns   At [facility] we value your feedback. You may receive a survey about your visit today. Please share your experience as we strive to create trusting relationships with our patients to provide genuine, compassionate, quality care.  We appreciate your understanding and patience as we review any laboratory studies, imaging, and other diagnostic tests that are ordered as we care for you. Our office policy is 5 business days for review of these results, and any emergent or urgent results are addressed in a timely manner for your best interest. If you do not hear from our office in 1 week, please contact us.   We also encourage the use of MyChart, which contains your medical information for your review as well. If you are not enrolled in this feature, an access code is on this after visit summary for your convenience. Thank you for allowing [place] to be involved in your care.  It was great to see you today!  I hope you have a great rest of your summer!!

## 2020-02-05 ENCOUNTER — Encounter: Payer: Self-pay | Admitting: Internal Medicine

## 2020-02-06 ENCOUNTER — Telehealth: Payer: Self-pay

## 2020-02-06 NOTE — Telephone Encounter (Signed)
[place] RUQ scheduled for 02/12/20 at 9:30am, arrive at 9:15am. NPO after midnight prior to test.  Called phone# listed in chart, pt's father answered. He said I would need to speak to pt but he couldn't get to him right now. Asked him to have pt call office and call was disconnected.  Mindy, pt isn't aware of [place] appt.

## 2020-02-06 NOTE — Telephone Encounter (Signed)
Pt called office, informed of [place] appt.

## 2020-02-11 ENCOUNTER — Encounter: Payer: Medicaid Other | Attending: Physician Assistant | Admitting: Nutrition

## 2020-02-11 ENCOUNTER — Other Ambulatory Visit: Payer: Self-pay

## 2020-02-11 ENCOUNTER — Encounter: Payer: Self-pay | Admitting: Nutrition

## 2020-02-11 DIAGNOSIS — K7460 Unspecified cirrhosis of liver: Secondary | ICD-10-CM | POA: Insufficient documentation

## 2020-02-11 DIAGNOSIS — R188 Other ascites: Secondary | ICD-10-CM | POA: Insufficient documentation

## 2020-02-11 NOTE — Progress Notes (Signed)
Medical Nutrition Therapy:  Appt start time: 1300 end time:  1330   Assessment:  Primary concerns today: Cirrhosis of liver, NASH.Marland Kitchen LIves with his dad. He looks better today. Not as pale or lethargic. Abdomen protruding due to ascites or ?Marland Kitchen Sees GI for his cirrhosis. He he notes he is working on a low salt diet. Got some new medications. Has been cooking more frozen vegetables, eating fruit, baked and grilled foods. Says his chest hurt last night like something was piercing his chest. Advised to talk to PCP. Already on reflux medications. He is making better choices about avoiding salty foods. He is now taking more lactulose and it's helping his stools be more regular..Still taking his iron. Getting [place] and blood work Architectural technologist. Now has medicaid.Will call back with the information on his card. Drinking water Started on Xifaxan for his memory. Albumin 3.4 mg/dl. Would benefit from a Prealbumin to better assess his nutritional status. Appetite is good.   CMP Latest Ref Rng & Units 01/08/2020 11/07/2019 10/20/2019  Glucose 70 - 99 mg/dL 90 90 110(H)  BUN 6 - 20 mg/dL 35(H) 45(H) 40(H)  Creatinine 0.61 - 1.24 mg/dL 1.77(H) 1.90(H) 1.64(H)  Sodium 135 - 145 mmol/L 131(L) 129(L) 124(L)  Potassium 3.5 - 5.1 mmol/L 4.6 5.1 5.5(H)  Chloride 98 - 111 mmol/L 97(L) 94(L) 90(L)  CO2 22 - 32 mmol/L 25 26 24   Calcium 8.9 - 10.3 mg/dL 9.0 8.8(L) 8.8(L)  Total Protein 6.5 - 8.1 g/dL 8.5(H) - 8.3(H)  Total Bilirubin 0.3 - 1.2 mg/dL 0.6 - 0.8  Alkaline Phos 38 - 126 U/L 69 - 100  AST 15 - 41 U/L 21 - 23  ALT 0 - 44 U/L 14 - 23   Wt Readings from Last 3 Encounters:  02/11/20 222 lb (100.7 kg)  02/04/20 225 lb 3.2 oz (102.2 kg)  01/15/20 226 lb (102.5 kg)   Ht Readings from Last 3 Encounters:  02/11/20 5' 8"  (1.727 m)  02/04/20 5' 8"  (1.727 m)  01/07/20 5' 8"  (1.727 m)   Body mass index is 33.75 kg/m. @BMIFA @ Facility age limit for growth percentiles is 20 years. Facility age limit for growth  percentiles is 20 years.    Preferred Learning Style:  Auditory       Visual -not reading.  Hands on  Learning Readiness:   Ready  Change in progress   MEDICATIONS:   DIETARY INTAKE:  24-hr recall:  B ( AM):  2 eggs, 3 slice of low sodium bacon, tomatoes,  Snk ( AM):  L ( PM): Can't remember. Snk ( PM):  D ( PM): Vegetable pasta, No salt added salt.  Snk ( PM): applesauce,  Beverages: water   Usual physical activity: walk some  Estimated energy needs: 1800-2000  calories 225 g carbohydrates 150  g protein 56 g fat  Progress Towards Goal(s):  In progress.   Nutritional Diagnosis:  NB-1.1 Food and nutrition-related knowledge deficit As related to Cirrhosis NASH.  As evidenced by Ascites.    Intervention:  Nutrition Cirrhosis education provided on My Plate, CHO counting, meal planning, portion sizes, timing of meals, Low salt diet, Cirrhosis Nutrition Therapy. Need for daily weights and calling MD with fluid weight gain. Low Potassium foods.  Goals  Continue to eat three meals per day Continue to read labels. Continue to eat fresh fruits and vegetables. Avoid fried foods due to salt intake. Let DR know about chest pains at night last night.   Teaching Method Utilized:  Visual  Auditory Hands on  Handouts given during visit include:  The Plate Method   Low Sodium Foods  Cirrhosis nutrition   Barriers to learning/adherence to lifestyle change: Cirrhosis.  Demonstrated degree of understanding via:  Teach Back   Monitoring/Evaluation:  Dietary intake, exercise, , and body weight in 1 month(s). Recommend to check a prealbumin level. Supplement with Ensure Protein 1-2 times per day as needed when appetite isn't as good. Watch for signs of hepatic encephalopathy. Marland Kitchen

## 2020-02-11 NOTE — Patient Instructions (Signed)
  Goals  Continue to eat three meals per day Continue to read labels. Continue to eat fresh fruits and vegetables. Avoid fried foods due to salt intake. Let DR know about chest pains at night last night.

## 2020-02-12 ENCOUNTER — Ambulatory Visit (HOSPITAL_COMMUNITY)
Admission: RE | Admit: 2020-02-12 | Discharge: 2020-02-12 | Disposition: A | Discharge status: Home / Self Care | Payer: Medicaid Other | Source: Ambulatory Visit | Attending: Nurse Practitioner | Admitting: Nurse Practitioner

## 2020-02-12 ENCOUNTER — Other Ambulatory Visit (HOSPITAL_COMMUNITY)
Admission: RE | Admit: 2020-02-12 | Discharge: 2020-02-12 | Disposition: A | Discharge status: Home / Self Care | Payer: Medicaid Other | Source: Ambulatory Visit | Attending: Nurse Practitioner | Admitting: Nurse Practitioner

## 2020-02-12 ENCOUNTER — Other Ambulatory Visit: Payer: Self-pay

## 2020-02-12 DIAGNOSIS — K7031 Alcoholic cirrhosis of liver with ascites: Secondary | ICD-10-CM | POA: Insufficient documentation

## 2020-02-12 DIAGNOSIS — I81 Portal vein thrombosis: Secondary | ICD-10-CM | POA: Insufficient documentation

## 2020-02-12 DIAGNOSIS — K766 Portal hypertension: Secondary | ICD-10-CM | POA: Insufficient documentation

## 2020-02-12 DIAGNOSIS — K5900 Constipation, unspecified: Secondary | ICD-10-CM | POA: Insufficient documentation

## 2020-02-12 LAB — COMPREHENSIVE METABOLIC PANEL
ALT: 15 U/L (ref 0–44)
AST: 22 U/L (ref 15–41)
Albumin: 3.7 g/dL (ref 3.5–5.0)
Alkaline Phosphatase: 64 U/L (ref 38–126)
Anion gap: 9 (ref 5–15)
BUN: 34 mg/dL — ABNORMAL HIGH (ref 6–20)
CO2: 26 mmol/L (ref 22–32)
Calcium: 9.7 mg/dL (ref 8.9–10.3)
Chloride: 95 mmol/L — ABNORMAL LOW (ref 98–111)
Creatinine, Ser: 1.86 mg/dL — ABNORMAL HIGH (ref 0.61–1.24)
GFR calc Af Amer: 50 mL/min — ABNORMAL LOW (ref >60)
GFR calc non Af Amer: 43 mL/min — ABNORMAL LOW (ref >60)
Glucose, Bld: 91 mg/dL (ref 70–99)
Potassium: 5.3 mmol/L — ABNORMAL HIGH (ref 3.5–5.1)
Sodium: 130 mmol/L — ABNORMAL LOW (ref 135–145)
Total Bilirubin: 0.8 mg/dL (ref 0.3–1.2)
Total Protein: 8.9 g/dL — ABNORMAL HIGH (ref 6.5–8.1)

## 2020-02-12 LAB — CBC WITH DIFFERENTIAL/PLATELET
Abs Immature Granulocytes: 0.02 10*3/uL (ref 0.00–0.07)
Basophils Absolute: 0 10*3/uL (ref 0.0–0.1)
Basophils Relative: 0 %
Eosinophils Absolute: 0.2 10*3/uL (ref 0.0–0.5)
Eosinophils Relative: 3 %
HCT: 35.3 % — ABNORMAL LOW (ref 39.0–52.0)
Hemoglobin: 11.3 g/dL — ABNORMAL LOW (ref 13.0–17.0)
Immature Granulocytes: 0 %
Lymphocytes Relative: 8 %
Lymphs Abs: 0.5 10*3/uL — ABNORMAL LOW (ref 0.7–4.0)
MCH: 29.7 pg (ref 26.0–34.0)
MCHC: 32 g/dL (ref 30.0–36.0)
MCV: 92.9 fL (ref 80.0–100.0)
Monocytes Absolute: 0.6 10*3/uL (ref 0.1–1.0)
Monocytes Relative: 10 %
Neutro Abs: 4.8 10*3/uL (ref 1.7–7.7)
Neutrophils Relative %: 79 %
Platelets: 157 10*3/uL (ref 150–400)
RBC: 3.8 MIL/uL — ABNORMAL LOW (ref 4.22–5.81)
RDW: 14.3 % (ref 11.5–15.5)
WBC: 6.1 10*3/uL (ref 4.0–10.5)
nRBC: 0 % (ref 0.0–0.2)

## 2020-02-12 LAB — PROTIME-INR
INR: 1.2 (ref 0.8–1.2)
Prothrombin Time: 15 seconds (ref 11.4–15.2)

## 2020-02-13 LAB — AFP TUMOR MARKER: AFP, Serum, Tumor Marker: 1.5 ng/mL (ref 0.0–8.3)

## 2020-02-17 ENCOUNTER — Other Ambulatory Visit: Payer: Self-pay | Admitting: Nurse Practitioner

## 2020-02-17 DIAGNOSIS — E875 Hyperkalemia: Secondary | ICD-10-CM

## 2020-02-24 ENCOUNTER — Telehealth: Payer: Self-pay

## 2020-02-24 NOTE — Telephone Encounter (Signed)
PA for xifaxan done and approved. Pharmacy informed.

## 2020-02-26 ENCOUNTER — Encounter: Payer: Self-pay | Admitting: Nutrition

## 2020-04-07 ENCOUNTER — Encounter: Payer: Self-pay | Admitting: Nurse Practitioner

## 2020-04-07 ENCOUNTER — Encounter: Payer: Self-pay | Admitting: *Deleted

## 2020-04-07 ENCOUNTER — Other Ambulatory Visit: Payer: Self-pay

## 2020-04-07 ENCOUNTER — Ambulatory Visit: Payer: Medicaid Other | Admitting: Nurse Practitioner

## 2020-04-07 VITALS — BP 120/77 | HR 76 | Temp 97.1°F | Ht 68.0 in | Wt 218.8 lb

## 2020-04-07 DIAGNOSIS — R188 Other ascites: Secondary | ICD-10-CM

## 2020-04-07 DIAGNOSIS — D696 Thrombocytopenia, unspecified: Secondary | ICD-10-CM

## 2020-04-07 DIAGNOSIS — K5900 Constipation, unspecified: Secondary | ICD-10-CM

## 2020-04-07 DIAGNOSIS — R609 Edema, unspecified: Secondary | ICD-10-CM

## 2020-04-07 DIAGNOSIS — I81 Portal vein thrombosis: Secondary | ICD-10-CM

## 2020-04-07 DIAGNOSIS — K766 Portal hypertension: Secondary | ICD-10-CM | POA: Diagnosis not present

## 2020-04-07 DIAGNOSIS — K7460 Unspecified cirrhosis of liver: Secondary | ICD-10-CM

## 2020-04-07 NOTE — Patient Instructions (Signed)
Your health issues we discussed today were:   Cirrhosis of the liver with fluid in the belly and high pressure in the liver: 1. Continue taking your current medications as you have been 2. Have your labs completed when you are able to 3. We will schedule an ultrasound paracentesis (to remove the fluid from your belly) 4. Let us know if you have any worsening swelling afterward 5. Continue taking your blood thinner 6. Continue taking nadolol.  Use caution when standing from a seated position to help in case she had some slight lightheadedness 7. Call us if your lightheadedness, dizziness gets worse or severe  Constipation: 1. As we discussed, you can increase the amount of lactulose you are taking 2. You do not have to take the lactulose all at once 3. You can break up your lactulose and to 2-3 doses a day.  For example, you can take 2 tablespoons in the morning and 2 tablespoons later in the afternoon 4. Let us know if you are still having problems taking your lactulose  Chest pain and sleeping problems: 1. As we discussed, call your primary care doctor today and tell them about chest pain and sleeping problems 2. Hopefully they will see you in the next couple days for this  Overall I recommend:  1. Continue your other current medications 2. Return for follow-up in 3 months 3. Call us for any questions or concerns  At [facility] we value your feedback. You may receive a survey about your visit today. Please share your experience as we strive to create trusting relationships with our patients to provide genuine, compassionate, quality care.  We appreciate your understanding and patience as we review any laboratory studies, imaging, and other diagnostic tests that are ordered as we care for you. Our office policy is 5 business days for review of these results, and any emergent or urgent results are addressed in a timely manner for your best interest. If you do not hear from  our office in 1 week, please contact us.   We also encourage the use of MyChart, which contains your medical information for your review as well. If you are not enrolled in this feature, an access code is on this after visit summary for your convenience. Thank you for allowing [place] to be involved in your care.  It was great to see you today!  I hope you have a Happy Thanksgiving!!

## 2020-04-07 NOTE — Progress Notes (Signed)
Referring Provider: Sofie Rower, PA-C Primary Care Physician:  Sofie Rower, PA-C Primary GI:  Dr. Gala Romney  Chief Complaint  Patient presents with  . Chest Pain  . Cirrhosis    HPI:   Alejandro Porter is a 44 y.o. male who presents for follow-up on constipation and cirrhosis.  The patient was last seen in our office 02/04/2020 for cirrhosis, PVT, constipation, portal hypertension.  He had incidental diagnosis of cirrhosis when he initially presented to our office for new patient evaluation with anasarca and massive ascites requiring hospitalization and diuresis over the course of 2 to 3 weeks.  Since then he has done pretty well.  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  He seems very committed to a healthier lifestyle and low-salt diet, is compliant with his medications.  Previous EGD during initial hospitalization found grade 1/grade 2 esophageal varices and he was started on a beta-blocker with heart rate historically near goal in the low to mid 60s.  At his last visit he noted he had hit his head a few times (on the car door, generally from this judging distances rather than dizziness or syncope).  Some bruising when he bumps extremities.  Bowel movement every 4 days with hard stools and straining.  Some intermittent abdominal pain and tightness that improves with a bowel movement and is associated with bloating.  Has dark stools but is on chronic iron.  Denies any other overt hepatic or GI symptoms.  Overall doing well.  His sister helps him grocery shop in order to track his sodium intake.  He did complete hepatitis a and B vaccines, with only 1 dose of hepatitis B in the series left.  Takes lactulose twice daily.  It was noted that his creatinine has been below bedbound (1.7-1.9) recently.  Recommended he increase lactulose with a goal of 3-4 soft bowel movements a day, Xifaxan twice daily, continue low-salt diet, update labs and imaging, consider  nephrology referral pending lab results, follow-up in 2 months.  Labs completed 02/12/2020.  CBC found mild but stable anemia with hemoglobin 11.3, normal platelet count at 157.  CMP found hyponatremia but stable at 130, some hyperkalemia, persistently elevated creatinine 1.86, normal albumin, normal LFTs.  INR normal at 1.2.  aFP normal at 1.5.  Recommended hold Aldactone for 2 days and then recheck labs.  Recommended forward copy of creatinine to PCP for follow-up and referral.  Unfortunately updated BMP was not completed, at least not in our system.  Right upper quadrant ultrasound completed 02/12/2020 found known cirrhosis with no evidence of hepatoma, large volume ascites in the right upper quadrant.  When notified of results he denies shortness of breath but does have some mild abdominal discomfort.  Advised holding off for the time being until he becomes symptomatic.   No further communication from the patient.  Today he states he doing okay overall. He started having left chest pain on Sunday (3 days ago). Has been having frequent coughing, father notes he does cough at night too. A couple days ago he had a sharp pain there; has improved some since sharp episode but is still intermittently persistent. Worse with taking a deep breath. Saw PCP 10/28 but this was before seeing PCP. Denies overt dyspnea. Does have some intermittent mid- to upper abdominal pain. He has increased the lactulose and is having some better bowel movements, but does have straining half-way through. Denies hematochezia, melena. Denies fever, chills, unintentional weight loss. Has  some LE pain (on Neurontin). No significant swelling with current diuretic regimen (Lasix 40 mg/Aldactone 100 mg). Is trying to eat fresh fruits, fresh foods, salads; tries to eat low salt. Denies any yellowing of the skin/eyes, darkened urine, acute episodic confusion, generalized tremors, generalized pruritis. Denies URI or flu-like symptoms. Denies  loss of sense of taste or smell. The patient has not received COVID-19 vaccination(s). Denies chest pain, dyspnea, dizziness, lightheadedness, syncope, near syncope. Denies any other upper or lower GI symptoms.  Past Medical History:  Diagnosis Date  . H/O colonoscopy   . H/O endoscopy   . Hypertension   . Liver cirrhosis ([place])   . Neuropathy   . S/P abdominal paracentesis     Past Surgical History:  Procedure Laterality Date  . BIOPSY  09/12/2019   Procedure: BIOPSY;  Surgeon: Daneil Dolin, MD;  Location: [facility];  Service: Endoscopy;;  . COLONOSCOPY N/A 09/18/2019   Procedure: COLONOSCOPY;  Surgeon: Danie Binder, MD;  Location: [facility];  Service: Endoscopy;  Laterality: N/A;  . ESOPHAGEAL BANDING N/A 09/12/2019   Procedure: ESOPHAGEAL BANDING;  Surgeon: Daneil Dolin, MD;  Location: [facility];  Service: Endoscopy;  Laterality: N/A;  . ESOPHAGOGASTRODUODENOSCOPY N/A 09/12/2019   Procedure: ESOPHAGOGASTRODUODENOSCOPY (EGD);  Surgeon: Daneil Dolin, MD;  [facility]: [facility];  Service: Endoscopy;  Laterality: N/A;  . POLYPECTOMY  09/18/2019   Procedure: POLYPECTOMY;  Surgeon: Danie Binder, MD;  [facility]: [facility];  Service: Endoscopy;;    Current Outpatient Medications  Medication Sig Dispense Refill  . apixaban (ELIQUIS) 5 MG TABS tablet Take 1 tablet (5 mg total) by mouth 2 (two) times daily. 60 tablet 5  . Cholecalciferol (VITAMIN D3) 125 MCG (5000 UT) TABS Take 1 tablet by mouth daily.    . Ferrous Sulfate (IRON) 325 (65 Fe) MG TABS Take 1 tablet (325 mg total) by mouth daily. 30 tablet 0  . furosemide (LASIX) 40 MG tablet Take 1 tablet (40 mg total) by mouth 2 (two) times daily. 60 tablet 3  . gabapentin (NEURONTIN) 300 MG capsule Take 1 capsule (300 mg total) by mouth at bedtime. 30 capsule 6  . lactulose (CHRONULAC) 10 GM/15ML solution Take 15 mLs (10 g total) by mouth daily. (Patient taking differently: Take 20 g by mouth daily. ) 946 mL 5  .  Multiple Vitamin (MULTIVITAMIN) tablet Take 1 tablet by mouth daily.    Marland Kitchen omeprazole (PRILOSEC) 20 MG capsule Take 1 capsule (20 mg total) by mouth daily. 30 capsule 5  . propranolol (INDERAL) 20 MG tablet Take 1 tablet (20 mg total) by mouth 2 (two) times daily. 60 tablet 5  . rifaximin (XIFAXAN) 550 MG TABS tablet Take 1 tablet (550 mg total) by mouth 2 (two) times daily. 60 tablet 5  . spironolactone (ALDACTONE) 100 MG tablet Take 1 tablet (100 mg total) by mouth 2 (two) times daily with a meal. 60 tablet 2   No current facility-administered medications for this visit.    Allergies as of 04/07/2020 - Review Complete 04/07/2020  Allergen Reaction Noted  . Chlorthalidone  11/04/2019  . Metoprolol  11/04/2019    Family History  Problem Relation Age of Onset  . Brain cancer Mother   . Diabetes Mother   . Diabetes Father   . Pulmonary embolism Brother   . Liver disease Neg Hx     Social History   Socioeconomic History  . Marital status: Single    Spouse name:  Not on file  . Number of children: Not on file  . Years of education: Not on file  . Highest education level: Not on file  Occupational History  . Occupation: umemployed  Tobacco Use  . Smoking status: Never Smoker  . Smokeless tobacco: Never Used  Substance and Sexual Activity  . Alcohol use: Never  . Drug use: Never  . Sexual activity: Not on file  Other Topics Concern  . Not on file  Social History Narrative  . Not on file   Social Determinants of Health   Financial Resource Strain:   . Difficulty of Paying Living Expenses: Not on file  Food Insecurity:   . Worried About Charity fundraiser in the Last Year: Not on file  . Ran Out of Food in the Last Year: Not on file  Transportation Needs:   . Lack of Transportation (Medical): Not on file  . Lack of Transportation (Non-Medical): Not on file  Physical Activity:   . Days of Exercise per Week: Not on file  . Minutes of Exercise per Session: Not on file    Stress:   . Feeling of Stress : Not on file  Social Connections:   . Frequency of Communication with Friends and Family: Not on file  . Frequency of Social Gatherings with Friends and Family: Not on file  . Attends Religious Services: Not on file  . Active Member of Clubs or Organizations: Not on file  . Attends Archivist Meetings: Not on file  . Marital Status: Not on file    Subjective: Review of Systems  Constitutional: Negative for chills, fever, malaise/fatigue and weight loss.  HENT: Negative for congestion and sore throat.   Respiratory: Negative for cough and shortness of breath.   Cardiovascular: Positive for chest pain. Negative for palpitations.  Gastrointestinal: Positive for abdominal pain and constipation (improved, but still some straining). Negative for blood in stool, diarrhea, melena, nausea and vomiting.  Musculoskeletal: Negative for joint pain and myalgias.  Skin: Negative for rash.  Neurological: Negative for dizziness and weakness.  Endo/Heme/Allergies: Does not bruise/bleed easily.  Psychiatric/Behavioral: Negative for depression. The patient is not nervous/anxious.   All other systems reviewed and are negative.    Objective: BP 120/77   Pulse 76   Temp (!) 97.1 F (36.2 C) (Temporal)   Ht 5' 8"  (1.727 m)   Wt 218 lb 12.8 oz (99.2 kg)   BMI 33.27 kg/m  Physical Exam Vitals and nursing note reviewed.  Constitutional:      General: He is not in acute distress.    Appearance: Normal appearance. He is obese. He is not ill-appearing, toxic-appearing or diaphoretic.  HENT:     Head: Normocephalic and atraumatic.     Nose: No congestion or rhinorrhea.  Eyes:     General: No scleral icterus. Cardiovascular:     Rate and Rhythm: Normal rate and regular rhythm.     Heart sounds: Normal heart sounds.  Pulmonary:     Effort: Pulmonary effort is normal.     Breath sounds: Normal breath sounds.  Abdominal:     General: Abdomen is  protuberant. Bowel sounds are normal. There is distension.     Palpations: Abdomen is soft. There is shifting dullness and fluid wave. There is no hepatomegaly, splenomegaly or mass.     Tenderness: There is abdominal tenderness in the right upper quadrant, epigastric area and left upper quadrant. There is no guarding or rebound.  Hernia: A hernia is present. Hernia is present in the ventral area.     Comments: No tense ascites  Musculoskeletal:     Cervical back: Neck supple.     Right lower leg: Edema present.     Left lower leg: Edema present.     Comments: Mild puffy, non-pitting edema  Skin:    General: Skin is warm and dry.     Coloration: Skin is not jaundiced.     Findings: No bruising or rash.  Neurological:     General: No focal deficit present.     Mental Status: He is alert and oriented to person, place, and time. Mental status is at baseline.     Motor: No tremor.     Comments: No flapping tremors  Psychiatric:        Mood and Affect: Mood normal.        Behavior: Behavior normal.        Thought Content: Thought content normal.      Assessment:  Very pleasant 45 year old male with history of cirrhosis with portal hypertension, ascites status post paracentesis, portal venous thrombus, constipation.  Overall he is stable and doing pretty well.  He is following guidelines for cirrhosis including low-salt diet.  His edema overall is doing well in his extremities.  Cirrhosis with portal hypertension and ascites: Lower extremity edema is puffy and nonpitting.  He does have a distended abdomen which is soft without tense ascites, although there is no appreciable fluid wave.  I feel he likely has large volume ascites reaccumulation.  He remains on Lasix/Aldactone 40 mg / 100 mg.  I am hesitant to adjust his diuretics given his elevated creatinine.  I am going to recheck labs including CBC and CMP to again recheck his LFTs and kidney function as well as for chronic anemia.   Continue other current medications.  No flapping tremors appreciated, is on lactulose and Xifaxan for hepatic encephalopathy prophylaxis.  If he tries to take more than 10 mL of lactulose at a time he gets sick I recommended he take a 10 mL dose in the morning and further doses later in the afternoon.  We will plan for an ultrasound paracentesis today or tomorrow.  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  No significant bruising or bleeding noted.  None noted on exam today either.  Recommend he continue current medications and follow-up with hematology as recommended  Constipation: Is doing somewhat better with lactulose increased dosing.  However, he cannot tolerate more than 10 mL at a time.  I recommended that he can space it out take 10 mL in the morning and add an additional 10 mL in the evening to see if this helps his constipation even more.  Call for worsening symptoms.  Chest pain: Today he describes left-sided chest pain that is worse with deep inspiration.  He has noted some frequent coughing (incidentally his father noted that he does cough during sleep as well).  I recommended he follow-up with primary care.  Query pulled intercostal muscles, costochondritis, versus cardiac etiology.  He denies any other red flag symptoms such as dyspnea, weakness, dizziness.  He indicates he will call his primary care to be seen for this   Plan: 1. Continue current medications including current diuretic doses 2. CBC, CMP 3. Ultrasound paracentesis today or tomorrow 4. Follow-up with primary care related to chest pain and sleep disturbances 5. Increase lactulose dose and break it up over 2-3  doses throughout the day 6. Call for worsening symptoms 7. Follow-up in 3 months    Thank you for allowing [place] to participate in the care of Alejandro Onnie Graham, DNP, AGNP-C Adult & Gerontological Nurse Practitioner [facility]   04/07/2020  2:51 PM   Disclaimer: This note was dictated with voice recognition software. Similar sounding words can inadvertently be transcribed and may not be corrected upon review.

## 2020-04-08 ENCOUNTER — Encounter (HOSPITAL_COMMUNITY): Payer: Self-pay

## 2020-04-08 ENCOUNTER — Other Ambulatory Visit: Payer: Self-pay

## 2020-04-08 ENCOUNTER — Other Ambulatory Visit (HOSPITAL_COMMUNITY)
Admission: RE | Admit: 2020-04-08 | Discharge: 2020-04-08 | Disposition: A | Discharge status: Home / Self Care | Payer: Medicaid Other | Source: Ambulatory Visit | Attending: Nurse Practitioner | Admitting: Nurse Practitioner

## 2020-04-08 ENCOUNTER — Ambulatory Visit (HOSPITAL_COMMUNITY)
Admission: RE | Admit: 2020-04-08 | Discharge: 2020-04-08 | Disposition: A | Discharge status: Home / Self Care | Payer: Medicaid Other | Source: Ambulatory Visit | Attending: Nurse Practitioner | Admitting: Nurse Practitioner

## 2020-04-08 DIAGNOSIS — I81 Portal vein thrombosis: Secondary | ICD-10-CM | POA: Insufficient documentation

## 2020-04-08 DIAGNOSIS — K5900 Constipation, unspecified: Secondary | ICD-10-CM | POA: Insufficient documentation

## 2020-04-08 DIAGNOSIS — R188 Other ascites: Secondary | ICD-10-CM | POA: Insufficient documentation

## 2020-04-08 DIAGNOSIS — R609 Edema, unspecified: Secondary | ICD-10-CM | POA: Insufficient documentation

## 2020-04-08 DIAGNOSIS — K766 Portal hypertension: Secondary | ICD-10-CM | POA: Insufficient documentation

## 2020-04-08 DIAGNOSIS — D696 Thrombocytopenia, unspecified: Secondary | ICD-10-CM | POA: Insufficient documentation

## 2020-04-08 DIAGNOSIS — K7460 Unspecified cirrhosis of liver: Secondary | ICD-10-CM | POA: Insufficient documentation

## 2020-04-08 LAB — BODY FLUID CELL COUNT WITH DIFFERENTIAL
Lymphs, Fluid: 41 %
Monocyte-Macrophage-Serous Fluid: 51 % (ref 50–90)
Neutrophil Count, Fluid: 8 % (ref 0–25)
Other Cells, Fluid: REACTIVE %
Total Nucleated Cell Count, Fluid: 505 cu mm (ref 0–1000)

## 2020-04-08 LAB — CBC WITH DIFFERENTIAL/PLATELET
Abs Immature Granulocytes: 0.03 10*3/uL (ref 0.00–0.07)
Basophils Absolute: 0 10*3/uL (ref 0.0–0.1)
Basophils Relative: 0 %
Eosinophils Absolute: 0.1 10*3/uL (ref 0.0–0.5)
Eosinophils Relative: 2 %
HCT: 28.9 % — ABNORMAL LOW (ref 39.0–52.0)
Hemoglobin: 9.1 g/dL — ABNORMAL LOW (ref 13.0–17.0)
Immature Granulocytes: 1 %
Lymphocytes Relative: 6 %
Lymphs Abs: 0.3 10*3/uL — ABNORMAL LOW (ref 0.7–4.0)
MCH: 29.2 pg (ref 26.0–34.0)
MCHC: 31.5 g/dL (ref 30.0–36.0)
MCV: 92.6 fL (ref 80.0–100.0)
Monocytes Absolute: 0.8 10*3/uL (ref 0.1–1.0)
Monocytes Relative: 13 %
Neutro Abs: 4.7 10*3/uL (ref 1.7–7.7)
Neutrophils Relative %: 78 %
Platelets: 158 10*3/uL (ref 150–400)
RBC: 3.12 MIL/uL — ABNORMAL LOW (ref 4.22–5.81)
RDW: 15.2 % (ref 11.5–15.5)
WBC: 6.1 10*3/uL (ref 4.0–10.5)
nRBC: 0 % (ref 0.0–0.2)

## 2020-04-08 LAB — ALBUMIN, PLEURAL OR PERITONEAL FLUID: Albumin, Fluid: 1.6 g/dL

## 2020-04-08 LAB — COMPREHENSIVE METABOLIC PANEL
ALT: 14 U/L (ref 0–44)
AST: 18 U/L (ref 15–41)
Albumin: 4 g/dL (ref 3.5–5.0)
Alkaline Phosphatase: 68 U/L (ref 38–126)
Anion gap: 9 (ref 5–15)
BUN: 43 mg/dL — ABNORMAL HIGH (ref 6–20)
CO2: 27 mmol/L (ref 22–32)
Calcium: 9.7 mg/dL (ref 8.9–10.3)
Chloride: 94 mmol/L — ABNORMAL LOW (ref 98–111)
Creatinine, Ser: 1.98 mg/dL — ABNORMAL HIGH (ref 0.61–1.24)
GFR, Estimated: 42 mL/min — ABNORMAL LOW (ref >60)
Glucose, Bld: 92 mg/dL (ref 70–99)
Potassium: 5.4 mmol/L — ABNORMAL HIGH (ref 3.5–5.1)
Sodium: 130 mmol/L — ABNORMAL LOW (ref 135–145)
Total Bilirubin: 0.6 mg/dL (ref 0.3–1.2)
Total Protein: 8.3 g/dL — ABNORMAL HIGH (ref 6.5–8.1)

## 2020-04-08 LAB — GRAM STAIN: Gram Stain: NONE SEEN

## 2020-04-08 MED ORDER — ALBUMIN HUMAN 25 % IV SOLN: 50.0000 g | Freq: Once | INTRAVENOUS | Status: AC

## 2020-04-08 MED ORDER — ALBUMIN HUMAN 25 % IV SOLN
INTRAVENOUS | Status: AC
  Administered 2020-04-08: 50 g via INTRAVENOUS
  Filled 2020-04-08: qty 200

## 2020-04-08 NOTE — Sedation Documentation (Signed)
PT tolerated right sided paracentesis procedure well today with 6 Liters removed and labs sent for processing. 50G IV albumin administered. PT verbalized understanding of d/c instructions and v/s stable at discharge with no acute distress noted.

## 2020-04-08 NOTE — Discharge Instructions (Signed)
Paracentesis, Care After This sheet gives you information about how to care for yourself after your procedure. Your health care provider may also give you more specific instructions. If you have problems or questions, contact your health care provider. What can I expect after the procedure? After the procedure, it is common to have a small amount of clear fluid coming from the puncture site. Follow these instructions at home: Puncture site care   Follow instructions from your health care provider about how to take care of your puncture site. Make sure you: ? Wash your hands with soap and water before and after you change your bandage (dressing). If soap and water are not available, use hand sanitizer. ? Change your dressing as told by your health care provider.  Check your puncture area every day for signs of infection. Check for: ? Redness, swelling, or pain. ? More fluid or blood. ? Warmth. ? Pus or a bad smell. General instructions  Return to your normal activities as told by your health care provider. Ask your health care provider what activities are safe for you.  Take over-the-counter and prescription medicines only as told by your health care provider.  Do not take baths, swim, or use a hot tub until your health care provider approves. Ask your health care provider if you may take showers. You may only be allowed to take sponge baths.  Keep all follow-up visits as told by your health care provider. This is important. Contact a health care provider if:  You have redness, swelling, or pain at your puncture site.  You have more fluid or blood coming from your puncture site.  Your puncture site feels warm to the touch.  You have pus or a bad smell coming from your puncture site.  You have a fever. Get help right away if:  You have chest pain or shortness of breath.  You develop increasing pain, discomfort, or swelling in your abdomen.  You feel dizzy or light-headed or  you faint. Summary  After the procedure, it is common to have a small amount of clear fluid coming from the puncture site.  Follow instructions from your health care provider about how to take care of your puncture site.  Check your puncture area every day signs of infection.  Keep all follow-up visits as told by your health care provider. This information is not intended to replace advice given to you by your health care provider. Make sure you discuss any questions you have with your health care provider. Document Revised: 12/03/2018 Document Reviewed: 03/12/2018 Elsevier Patient Education  2020 [employer].

## 2020-04-08 NOTE — Procedures (Signed)
  US guided RLQ paracentesis  6 L yellow fluid obtained Sent for labs per MD  Tolerated well  EBL: none

## 2020-04-09 LAB — CYTOLOGY - NON PAP

## 2020-04-13 LAB — CULTURE, BODY FLUID W GRAM STAIN -BOTTLE: Culture: NO GROWTH

## 2020-04-14 ENCOUNTER — Other Ambulatory Visit: Payer: Self-pay | Admitting: Nurse Practitioner

## 2020-04-14 ENCOUNTER — Telehealth: Payer: Self-pay | Admitting: Internal Medicine

## 2020-04-14 DIAGNOSIS — R7989 Other specified abnormal findings of blood chemistry: Secondary | ICD-10-CM

## 2020-04-14 NOTE — Telephone Encounter (Signed)
Spoke with pt. result note for documentation.

## 2020-04-14 NOTE — Progress Notes (Signed)
Recheck due to Cr 1.98 on diuretics.

## 2020-04-14 NOTE — Telephone Encounter (Signed)
Patient said someone from here called him, please call back

## 2020-04-15 ENCOUNTER — Other Ambulatory Visit: Payer: Self-pay

## 2020-04-15 DIAGNOSIS — K7460 Unspecified cirrhosis of liver: Secondary | ICD-10-CM

## 2020-04-15 DIAGNOSIS — N289 Disorder of kidney and ureter, unspecified: Secondary | ICD-10-CM

## 2020-04-15 NOTE — Progress Notes (Unsigned)
mb

## 2020-04-16 ENCOUNTER — Other Ambulatory Visit: Payer: Self-pay

## 2020-04-16 ENCOUNTER — Other Ambulatory Visit (HOSPITAL_COMMUNITY)
Admission: RE | Admit: 2020-04-16 | Discharge: 2020-04-16 | Disposition: A | Discharge status: Home / Self Care | Payer: Medicaid Other | Source: Ambulatory Visit | Attending: Nurse Practitioner | Admitting: Nurse Practitioner

## 2020-04-16 DIAGNOSIS — E875 Hyperkalemia: Secondary | ICD-10-CM | POA: Insufficient documentation

## 2020-04-16 DIAGNOSIS — R7989 Other specified abnormal findings of blood chemistry: Secondary | ICD-10-CM | POA: Diagnosis present

## 2020-04-16 LAB — BASIC METABOLIC PANEL
Anion gap: 9 (ref 5–15)
BUN: 36 mg/dL — ABNORMAL HIGH (ref 6–20)
CO2: 26 mmol/L (ref 22–32)
Calcium: 9.2 mg/dL (ref 8.9–10.3)
Chloride: 92 mmol/L — ABNORMAL LOW (ref 98–111)
Creatinine, Ser: 1.65 mg/dL — ABNORMAL HIGH (ref 0.61–1.24)
GFR, Estimated: 52 mL/min — ABNORMAL LOW (ref >60)
Glucose, Bld: 86 mg/dL (ref 70–99)
Potassium: 5.1 mmol/L (ref 3.5–5.1)
Sodium: 127 mmol/L — ABNORMAL LOW (ref 135–145)

## 2020-05-03 ENCOUNTER — Other Ambulatory Visit: Payer: Self-pay

## 2020-05-03 ENCOUNTER — Telehealth: Payer: Self-pay | Admitting: Internal Medicine

## 2020-05-03 DIAGNOSIS — R609 Edema, unspecified: Secondary | ICD-10-CM

## 2020-05-03 DIAGNOSIS — Z79899 Other long term (current) drug therapy: Secondary | ICD-10-CM

## 2020-05-03 NOTE — Telephone Encounter (Signed)
Let's get updated BMP. What dosage of diuretics now? Please address with Randall Hiss tomorrow. The neck, shoulder pain was present at last appt and needs addressing with PCP ASAP. To ED if chest pain, SOB, worsening edema.

## 2020-05-03 NOTE — Telephone Encounter (Signed)
Spoke with pt. Pt is going to discuss his pain with his PCP, have labs done in the morning at [facility] (lab orders placed), and go to the ED if he has worsening edema/pain. Pt is taking half of his normal dose of Lasix 40 mg bid.

## 2020-05-03 NOTE — Telephone Encounter (Signed)
Spoke with pt. Pt has gained 30 lbs since his apt 04/07/20. Pt reports no shortness of breath. Pt has severe stabbing pain that hurts more when pt is laying down around pt neck, shoulder and lt arm. Pain has been present for about 1 week and pt reports swelling in his lower legs, feet and abdomen. Please advise in the absence of Walden Field, NP. Pt was advised to go to the ED if his symptoms got worse.

## 2020-05-03 NOTE — Telephone Encounter (Signed)
[phone] patient called and said that he has gained 30 lbs since last time he had a paracentesis.  He was also told to cut his fluid pills in half and said that since then he is having chest and arm pain

## 2020-05-04 ENCOUNTER — Other Ambulatory Visit (HOSPITAL_COMMUNITY)
Admission: RE | Admit: 2020-05-04 | Discharge: 2020-05-04 | Disposition: A | Discharge status: Home / Self Care | Payer: Medicaid Other | Source: Ambulatory Visit | Attending: Gastroenterology | Admitting: Gastroenterology

## 2020-05-04 DIAGNOSIS — R609 Edema, unspecified: Secondary | ICD-10-CM | POA: Insufficient documentation

## 2020-05-04 DIAGNOSIS — Z79899 Other long term (current) drug therapy: Secondary | ICD-10-CM | POA: Insufficient documentation

## 2020-05-04 LAB — BASIC METABOLIC PANEL
Anion gap: 8 (ref 5–15)
BUN: 26 mg/dL — ABNORMAL HIGH (ref 6–20)
CO2: 25 mmol/L (ref 22–32)
Calcium: 9.1 mg/dL (ref 8.9–10.3)
Chloride: 93 mmol/L — ABNORMAL LOW (ref 98–111)
Creatinine, Ser: 1.39 mg/dL — ABNORMAL HIGH (ref 0.61–1.24)
GFR, Estimated: >60 mL/min (ref >60)
Glucose, Bld: 101 mg/dL — ABNORMAL HIGH (ref 70–99)
Potassium: 5.1 mmol/L (ref 3.5–5.1)
Sodium: 126 mmol/L — ABNORMAL LOW (ref 135–145)

## 2020-05-05 NOTE — Telephone Encounter (Signed)
Will wait for lab results.

## 2020-05-07 ENCOUNTER — Telehealth: Payer: Self-pay | Admitting: Internal Medicine

## 2020-05-07 ENCOUNTER — Ambulatory Visit (HOSPITAL_COMMUNITY)
Admission: RE | Admit: 2020-05-07 | Discharge: 2020-05-07 | Disposition: A | Discharge status: Home / Self Care | Payer: Medicaid Other | Source: Ambulatory Visit | Attending: Nurse Practitioner | Admitting: Nurse Practitioner

## 2020-05-07 ENCOUNTER — Other Ambulatory Visit: Payer: Self-pay

## 2020-05-07 ENCOUNTER — Other Ambulatory Visit: Payer: Self-pay | Admitting: *Deleted

## 2020-05-07 ENCOUNTER — Encounter (HOSPITAL_COMMUNITY): Payer: Self-pay

## 2020-05-07 DIAGNOSIS — R188 Other ascites: Secondary | ICD-10-CM | POA: Diagnosis present

## 2020-05-07 LAB — BODY FLUID CELL COUNT WITH DIFFERENTIAL
Eos, Fluid: 0 %
Lymphs, Fluid: 32 %
Monocyte-Macrophage-Serous Fluid: 60 % (ref 50–90)
Neutrophil Count, Fluid: 8 % (ref 0–25)
Total Nucleated Cell Count, Fluid: 261 cu mm (ref 0–1000)

## 2020-05-07 LAB — GRAM STAIN: Gram Stain: NONE SEEN

## 2020-05-07 MED ORDER — ALBUMIN HUMAN 25 % IV SOLN: 0.0000 g | Freq: Once | INTRAVENOUS | Status: AC

## 2020-05-07 MED ORDER — ALBUMIN HUMAN 25 % IV SOLN
INTRAVENOUS | Status: AC
  Administered 2020-05-07: 50 g via INTRAVENOUS
  Filled 2020-05-07: qty 200

## 2020-05-07 NOTE — Sedation Documentation (Signed)
PT tolerated right sided paracentesis well today and 7 Liters of clear yellow fluid removed with labs obtained and sent for processing. PT given 50 G of IV albumin during procedure today and tolerated well. PT verbalized understanding of discharge instructions.

## 2020-05-07 NOTE — Telephone Encounter (Signed)
noted 

## 2020-05-07 NOTE — Procedures (Signed)
PreOperative Dx: Cirrhosis, ascites Postoperative Dx: Cirrhosis, ascites Procedure:   US guided paracentesis Radiologist:  Thornton Papas Anesthesia:  10 ml of1% lidocaine Specimen:  7 L of yellow ascitic fluid EBL:   < 1 ml Complications: None

## 2020-05-07 NOTE — Discharge Instructions (Signed)
Paracentesis, Care After This sheet gives you information about how to care for yourself after your procedure. Your health care provider may also give you more specific instructions. If you have problems or questions, contact your health care provider. What can I expect after the procedure? After the procedure, it is common to have a small amount of clear fluid coming from the puncture site. Follow these instructions at home: Puncture site care   Follow instructions from your health care provider about how to take care of your puncture site. Make sure you: ? Wash your hands with soap and water before and after you change your bandage (dressing). If soap and water are not available, use hand sanitizer. ? Change your dressing as told by your health care provider.  Check your puncture area every day for signs of infection. Check for: ? Redness, swelling, or pain. ? More fluid or blood. ? Warmth. ? Pus or a bad smell. General instructions  Return to your normal activities as told by your health care provider. Ask your health care provider what activities are safe for you.  Take over-the-counter and prescription medicines only as told by your health care provider.  Do not take baths, swim, or use a hot tub until your health care provider approves. Ask your health care provider if you may take showers. You may only be allowed to take sponge baths.  Keep all follow-up visits as told by your health care provider. This is important. Contact a health care provider if:  You have redness, swelling, or pain at your puncture site.  You have more fluid or blood coming from your puncture site.  Your puncture site feels warm to the touch.  You have pus or a bad smell coming from your puncture site.  You have a fever. Get help right away if:  You have chest pain or shortness of breath.  You develop increasing pain, discomfort, or swelling in your abdomen.  You feel dizzy or light-headed or  you faint. Summary  After the procedure, it is common to have a small amount of clear fluid coming from the puncture site.  Follow instructions from your health care provider about how to take care of your puncture site.  Check your puncture area every day signs of infection.  Keep all follow-up visits as told by your health care provider. This information is not intended to replace advice given to you by your health care provider. Make sure you discuss any questions you have with your health care provider. Document Revised: 12/03/2018 Document Reviewed: 03/12/2018 Elsevier Patient Education  2020 [employer].

## 2020-05-07 NOTE — Telephone Encounter (Signed)
Patient needs para asap, Dr. Toya Smothers office called and he has gained 30 pounds   [phone] [facility])

## 2020-05-07 NOTE — Telephone Encounter (Signed)
Received call from Alejandro Porter. Order STAT para for today. Needs cell count, culture, gram stain, albumin per protocol, no max fluid.

## 2020-05-07 NOTE — Telephone Encounter (Signed)
Called [employer]. Patient needs to arrive today at 11:45am. Patient is being worked in so it may be a little wait.  Called [facility] and spoke with [place]. Patient still in office. She is aware of appt details and will inform patient.

## 2020-05-10 LAB — PATHOLOGIST SMEAR REVIEW

## 2020-05-11 ENCOUNTER — Other Ambulatory Visit: Payer: Self-pay

## 2020-05-11 ENCOUNTER — Ambulatory Visit: Payer: Medicaid Other | Admitting: Nurse Practitioner

## 2020-05-11 ENCOUNTER — Encounter: Payer: Self-pay | Admitting: Nurse Practitioner

## 2020-05-11 VITALS — BP 120/83 | HR 74 | Temp 97.3°F | Ht 68.0 in | Wt 208.8 lb

## 2020-05-11 DIAGNOSIS — K766 Portal hypertension: Secondary | ICD-10-CM | POA: Diagnosis not present

## 2020-05-11 DIAGNOSIS — K7682 Hepatic encephalopathy: Secondary | ICD-10-CM | POA: Insufficient documentation

## 2020-05-11 DIAGNOSIS — K7460 Unspecified cirrhosis of liver: Secondary | ICD-10-CM

## 2020-05-11 DIAGNOSIS — G9341 Metabolic encephalopathy: Secondary | ICD-10-CM | POA: Insufficient documentation

## 2020-05-11 DIAGNOSIS — R188 Other ascites: Secondary | ICD-10-CM | POA: Diagnosis not present

## 2020-05-11 DIAGNOSIS — K7290 Hepatic failure, unspecified without coma: Secondary | ICD-10-CM

## 2020-05-11 DIAGNOSIS — D696 Thrombocytopenia, unspecified: Secondary | ICD-10-CM

## 2020-05-11 DIAGNOSIS — I81 Portal vein thrombosis: Secondary | ICD-10-CM | POA: Diagnosis not present

## 2020-05-11 DIAGNOSIS — R609 Edema, unspecified: Secondary | ICD-10-CM

## 2020-05-11 NOTE — Progress Notes (Signed)
Referring Provider: Sofie Rower, PA-C Primary Care Physician:  Sofie Rower, PA-C Primary GI:  Dr. Gala Romney  Chief Complaint  Patient presents with  . Cirrhosis    abd and legs have been swelling since Lasix and Spironolactone dose were cut in half    HPI:   Alejandro Porter is a 44 y.o. male who presents for follow-up on cirrhosis, portal hypertension, PVT, thrombocytopenia, lower extremity edema, constipation.  Patient was last seen in our office 04/07/2020 for the same. He had incidental diagnosis of cirrhosis when he initially presented to our office for new patient evaluation with anasarca and massive ascites requiring hospitalization and diuresis over the course of 2 to 3 weeks.  Since then he has done pretty well.  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  He seems very committed to a healthier lifestyle and low-salt diet, is compliant with his medications.  Previous EGD during initial hospitalization found grade 1/grade 2 esophageal varices and he was started on a beta-blocker with heart rate historically near goal in the low to mid 60s.  For his cirrhosis and sequela, currently trying to follow a low-salt diet and his sister helps him grocery shop to assist him.  Hepatitis a and B vaccines were completed for the most part with only 1 dose of hepatitis B left, using lactulose and Xifaxan for hepatic encephalopathy prophylaxis, some creeping creatinine recently 1.7-1.9.  Previously referred to nephrology for this.  In September his creatinine bumped to 1.6 and recommended he hold Aldactone for 2 days and then recheck labs, although this was not completed.  His last visit noted left chest pain for the past 3 days with frequent cough that improved somewhat in the past day or so.  Worsening with deep breathing.  Some intermittent mid to upper abdominal pain.  Has increased lactulose and having better bowel movements but still straining halfway  through.  Some lower extremity pain, currently on Neurontin.  No significant swelling with his current diuretic regiment consisting of Lasix 40 mg/Aldactone 100 mg.  No other complaints.  Recommended continue current medications, update CBC and CMP as well as ultrasound paracentesis for abdominal distention, follow-up with primary care related to chest pain and sleep disturbances, further discussion on lactulose titration was undertaken, call for worsening symptoms and follow-up in 3 months.  On 1129 the patient called stating he had gained 30 pounds since his last paracentesis.  His fluid pills were cut in half due to his kidney function.  Recommended updated BMP, requested current diuretic dosing.  Recommended ED evaluation for chest pain, shortness of breath, worsening edema.  Soon thereafter we received a call from nephrology who saw the patient and indicated 30 pound weight gain.  He was added on for stat paracentesis with cell count, culture, Gram stain, albumin per protocol.  He underwent abdominal paracentesis 05/07/2020 that yielded approximately 7 L of ascitic fluid.  Cell count, Gram stain, fluid culture not consistent with SBP.  The patient reported feeling better after his paracentesis.  Today states doing okay overall. He's feeling a little bit better since his paracentesis. States he's gained 3 lbs in the past 4 days. Doesn't feel like his abdomen is swelling. His LEs are swelling more. Nephrology has reduced his diuretics due to kidney function changes. This weekend he fell off the porch because he forgot the steps had been moved. No significant injuries other than a bruise on his hand. Didn't hit his head. Has a bowel  movement 3 times a day with increased lactulose, stools are soft. Still on Xifaxan. Still on Eliquis, following with hematology and next appointment is scheduled for February. Thinks he been a bit more forgetful recently. His dentist wants to have a few teeth pulled, will need to  follow-up with hematology. Denies abdominal pain, N/V, hematochezia, melena, fever, chills, unintentional weight loss. Denies URI or flu-like symptoms. Denies loss of sense of taste or smell. The patient has not received COVID-19 vaccination(s). Denies any yellowing of the skin/eyes, darkened urine, acute episodic confusion, generalized tremors, generalized pruritis.  Objectively weight down 10 lbs since last visit (1 month ago)  HE prophylaxis: lactulose/Xifaxan PVT anticoagulation: Eliquis Variceal screening/prophylaxis: EGD 09/12/19; Propranolol 70m bid (HR 74) Education: Low salt diet, limit Tylenol, diet/exercise reinforced  Past Medical History:  Diagnosis Date  . H/O colonoscopy   . H/O endoscopy   . Hypertension   . Liver cirrhosis ([place]   . Neuropathy   . S/P abdominal paracentesis     Past Surgical History:  Procedure Laterality Date  . BIOPSY  09/12/2019   Procedure: BIOPSY;  Surgeon: RDaneil Dolin MD;  Location: [facility];  Service: Endoscopy;;  . COLONOSCOPY N/A 09/18/2019   Procedure: COLONOSCOPY;  Surgeon: FDanie Binder MD;  Location: [facility];  Service: Endoscopy;  Laterality: N/A;  . ESOPHAGEAL BANDING N/A 09/12/2019   Procedure: ESOPHAGEAL BANDING;  Surgeon: RDaneil Dolin MD;  Location: [facility];  Service: Endoscopy;  Laterality: N/A;  . ESOPHAGOGASTRODUODENOSCOPY N/A 09/12/2019   Procedure: ESOPHAGOGASTRODUODENOSCOPY (EGD);  Surgeon: RDaneil Dolin MD;  Location: [facility];  Service: Endoscopy;  Laterality: N/A;  . POLYPECTOMY  09/18/2019   Procedure: POLYPECTOMY;  Surgeon: FDanie Binder MD;  Location: [facility];  Service: Endoscopy;;    Current Outpatient Medications  Medication Sig Dispense Refill  . apixaban (ELIQUIS) 5 MG TABS tablet Take 1 tablet (5 mg total) by mouth 2 (two) times daily. 60 tablet 5  . Cholecalciferol (VITAMIN D3) 125 MCG (5000 UT) TABS Take 1 tablet by mouth daily.    . Ferrous Sulfate (IRON) 325 (65 Fe) MG  TABS Take 1 tablet (325 mg total) by mouth daily. 30 tablet 0  . furosemide (LASIX) 40 MG tablet Take 1 tablet (40 mg total) by mouth 2 (two) times daily. (Patient taking differently: Take 20 mg by mouth 2 (two) times daily. ) 60 tablet 3  . gabapentin (NEURONTIN) 300 MG capsule Take 1 capsule (300 mg total) by mouth at bedtime. 30 capsule 6  . lactulose (CHRONULAC) 10 GM/15ML solution Take 15 mLs (10 g total) by mouth daily. (Patient taking differently: Take 20 g by mouth daily. ) 946 mL 5  . Multiple Vitamin (MULTIVITAMIN) tablet Take 1 tablet by mouth daily.    .Marland Kitchenomeprazole (PRILOSEC) 20 MG capsule Take 1 capsule (20 mg total) by mouth daily. 30 capsule 5  . propranolol (INDERAL) 20 MG tablet Take 1 tablet (20 mg total) by mouth 2 (two) times daily. 60 tablet 5  . rifaximin (XIFAXAN) 550 MG TABS tablet Take 1 tablet (550 mg total) by mouth 2 (two) times daily. 60 tablet 5  . spironolactone (ALDACTONE) 100 MG tablet Take 1 tablet (100 mg total) by mouth 2 (two) times daily with a meal. (Patient taking differently: Take 50 mg by mouth 2 (two) times daily with a meal. ) 60 tablet 2   No current facility-administered medications for this visit.    Allergies as of  05/11/2020 - Review Complete 05/11/2020  Allergen Reaction Noted  . Chlorthalidone  11/04/2019  . Metoprolol  11/04/2019    Family History  Problem Relation Age of Onset  . Brain cancer Mother   . Diabetes Mother   . Diabetes Father   . Pulmonary embolism Brother   . Liver disease Neg Hx     Social History   Socioeconomic History  . Marital status: Single    Spouse name: Not on file  . Number of children: Not on file  . Years of education: Not on file  . Highest education level: Not on file  Occupational History  . Occupation: umemployed  Tobacco Use  . Smoking status: Never Smoker  . Smokeless tobacco: Never Used  Vaping Use  . Vaping Use: Never used  Substance and Sexual Activity  . Alcohol use: Never  . Drug  use: Never  . Sexual activity: Not on file  Other Topics Concern  . Not on file  Social History Narrative  . Not on file   Social Determinants of Health   Financial Resource Strain:   . Difficulty of Paying Living Expenses: Not on file  Food Insecurity:   . Worried About Charity fundraiser in the Last Year: Not on file  . Ran Out of Food in the Last Year: Not on file  Transportation Needs:   . Lack of Transportation (Medical): Not on file  . Lack of Transportation (Non-Medical): Not on file  Physical Activity:   . Days of Exercise per Week: Not on file  . Minutes of Exercise per Session: Not on file  Stress:   . Feeling of Stress : Not on file  Social Connections:   . Frequency of Communication with Friends and Family: Not on file  . Frequency of Social Gatherings with Friends and Family: Not on file  . Attends Religious Services: Not on file  . Active Member of Clubs or Organizations: Not on file  . Attends Archivist Meetings: Not on file  . Marital Status: Not on file    Subjective: Review of Systems  Constitutional: Negative for chills, fever, malaise/fatigue and weight loss.  HENT: Negative for congestion and sore throat.   Respiratory: Negative for cough and shortness of breath.   Cardiovascular: Negative for chest pain and palpitations.  Gastrointestinal: Negative for abdominal pain, blood in stool, constipation, diarrhea, heartburn, melena, nausea and vomiting.       Less abdominal swelling  Musculoskeletal: Negative for joint pain and myalgias.  Skin: Negative for rash.  Neurological: Negative for dizziness and weakness.  Endo/Heme/Allergies: Does not bruise/bleed easily.  Psychiatric/Behavioral: Negative for depression. The patient is not nervous/anxious.        Some intermittent increase in forgetfullness  All other systems reviewed and are negative.    Objective: BP 120/83   Pulse 74   Temp (!) 97.3 F (36.3 C) (Temporal)   Ht 5' 8"  (1.727  m)   Wt 208 lb 12.8 oz (94.7 kg)   BMI 31.75 kg/m  Physical Exam Vitals and nursing note reviewed.  Constitutional:      General: He is not in acute distress.    Appearance: Normal appearance. He is obese. He is not ill-appearing, toxic-appearing or diaphoretic.  HENT:     Head: Normocephalic and atraumatic.     Nose: No congestion or rhinorrhea.  Eyes:     General: No scleral icterus. Cardiovascular:     Rate and Rhythm: Normal rate and regular rhythm.  Heart sounds: Normal heart sounds.  Pulmonary:     Effort: Pulmonary effort is normal.     Breath sounds: Normal breath sounds.  Abdominal:     General: Bowel sounds are normal. There is no distension.     Palpations: Abdomen is soft. There is no shifting dullness, fluid wave, hepatomegaly, splenomegaly or mass.     Tenderness: There is no abdominal tenderness. There is no guarding or rebound.     Hernia: No hernia is present.  Musculoskeletal:     Cervical back: Neck supple.  Skin:    General: Skin is warm and dry.     Coloration: Skin is not jaundiced.     Findings: No bruising or rash.  Neurological:     General: No focal deficit present.     Mental Status: He is alert and oriented to person, place, and time. Mental status is at baseline.  Psychiatric:        Mood and Affect: Mood normal.        Behavior: Behavior normal.        Thought Content: Thought content normal.      Assessment:  Very pleasant 44 year old male presents for follow-up on cirrhosis and sequela of cirrhosis including hepatic encephalopathy, portal venous thrombus, portal hypertension, thrombocytopenia.  Overall he is doing stable, recently had paracentesis for abdominal distention and 30 pound weight gain.  No red flag/warning signs or symptoms.  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  He is much improved compared to when we first saw him.  He does have diagnosed portal venous thrombus and is on Eliquis for this.  No  significant bruising or bleeding other than when he fell recently which resulted in a bruise on the hand.  He notes some increasing forgetfulness, currently on Xifaxan and lactulose and having 3 soft bowel movements a day.  I will check serum ammonia and increase his lactulose with a goal of 4-5 stools a day to see if can get any improvement on his forgetfulness.  Portal hypertension with esophageal varices with EGD 09/12/2019 noting barely discernible grade 1 esophageal varices.  He is on variceal prophylaxis with propranolol 20 mg twice daily although his heart rate is not well controlled (74 today; goal of 55-60.  Blood pressure adequate at 120/83).  At this point I will have him increase his propranolol to 20 mg in the morning and 40 mg in the afternoon with a nurse visit 1 week to check his blood pressure, heart rate, weight.  His labs and imaging are up-to-date otherwise.  I have reinforced multiple points of education related to cirrhosis and cirrhosis sequela.  Portal hypertension with ascites: His kidney function has been somewhat worsening and he is now seeing nephrology.  They have cut his diuretics back and this is resulted in accumulation of ascites.  He recently underwent a 7 L paracentesis with no sequela for SBP.  His lower extremity edema is puffy but nonpitting today.  Abdomen is soft and no obvious significant ascites.  He feels he is put on 2 to 3 pounds in the past 4 days, although this could be due to different scales.  We will follow up with a weight check in a week.  I recommend he call if for obvious abdominal distention especially if it is associated with abdominal pain or shortness of breath.  If we are not able to make much adjustment to his diuretic regimen we may need to plan for more frequent  paracentesis as needed.  I have reinforced low-salt diet as well.   Plan: 1. Serum ammonia 2. Continue current medications especially Xifaxan, Eliquis. 3. Increase lactulose with a goal of  4-5 stools a day 4. Increase propranolol to 20 mg in the a.m. and 40 mg in the p.m. 5. Schedule nurse visit for 1 week for blood pressure check, heart rate check, weight check given plan for titration of propranolol and concerns for weight gain 6. Call us for any significant weight gain (more than 5 pounds over 2 days) or for worsening abdominal distention with abdominal pain and/or shortness of breath  7. Follow-up in 3 months otherwise    Thank you for allowing [place] to participate in the care of Tytan Onnie Graham, DNP, AGNP-C Adult & Gerontological Nurse Practitioner [facility]   05/11/2020 2:47 PM   Disclaimer: This note was dictated with voice recognition software. Similar sounding words can inadvertently be transcribed and may not be corrected upon review.

## 2020-05-11 NOTE — Patient Instructions (Addendum)
Your health issues we discussed today were:   Cirrhosis and problems from cirrhosis: 1. Have your labs checked when you are able to 2. Continue taking Xifaxan and Eliquis at your current dose 3. Follow-up with hematology for management of your blood thinner (Eliquis) 4. Increase the amount of lactulose you are taking with a goal of 4-5 stools a day to see if this helps with your forgetfulness 5. Increase your propranolol (Inderal) to 20 mg in the morning and 40 mg in the evening 6. Follow-up in 1 week with our office nurse to check your blood pressure, heart rate, and weight 7. Call us if you see worsening swelling in your abdomen especially if it comes with abdominal pain and shortness of breath 8. Call us for any other concerning symptoms related to your liver  Overall I recommend:  1. Continue your other current medications 2. Return for follow-up in 3 months 3. Call us for any questions or concerns   ---------------------------------------------------------------  At [facility] we value your feedback. You may receive a survey about your visit today. Please share your experience as we strive to create trusting relationships with our patients to provide genuine, compassionate, quality care.  We appreciate your understanding and patience as we review any laboratory studies, imaging, and other diagnostic tests that are ordered as we care for you. Our office policy is 5 business days for review of these results, and any emergent or urgent results are addressed in a timely manner for your best interest. If you do not hear from our office in 1 week, please contact us.   We also encourage the use of MyChart, which contains your medical information for your review as well. If you are not enrolled in this feature, an access code is on this after visit summary for your convenience. Thank you for allowing [place] to be involved in your care.  It was great to see you today!  I hope you  have a Merry Christmas and Happy Holidays!!    ---------------------------------------------------------------

## 2020-05-12 NOTE — Progress Notes (Signed)
Cc'ed to pcp °

## 2020-05-13 ENCOUNTER — Other Ambulatory Visit (HOSPITAL_COMMUNITY)
Admission: RE | Admit: 2020-05-13 | Discharge: 2020-05-13 | Disposition: A | Discharge status: Home / Self Care | Payer: Medicaid Other | Source: Ambulatory Visit | Attending: Nurse Practitioner | Admitting: Nurse Practitioner

## 2020-05-13 ENCOUNTER — Encounter: Payer: Medicaid Other | Attending: Physician Assistant | Admitting: Nutrition

## 2020-05-13 ENCOUNTER — Encounter: Payer: Self-pay | Admitting: Nutrition

## 2020-05-13 ENCOUNTER — Other Ambulatory Visit: Payer: Self-pay

## 2020-05-13 VITALS — Ht 68.0 in | Wt 205.0 lb

## 2020-05-13 DIAGNOSIS — R188 Other ascites: Secondary | ICD-10-CM | POA: Diagnosis present

## 2020-05-13 DIAGNOSIS — K7290 Hepatic failure, unspecified without coma: Secondary | ICD-10-CM | POA: Diagnosis present

## 2020-05-13 DIAGNOSIS — K7460 Unspecified cirrhosis of liver: Secondary | ICD-10-CM | POA: Diagnosis present

## 2020-05-13 DIAGNOSIS — K766 Portal hypertension: Secondary | ICD-10-CM | POA: Diagnosis present

## 2020-05-13 DIAGNOSIS — D696 Thrombocytopenia, unspecified: Secondary | ICD-10-CM | POA: Diagnosis present

## 2020-05-13 DIAGNOSIS — E440 Moderate protein-calorie malnutrition: Secondary | ICD-10-CM | POA: Insufficient documentation

## 2020-05-13 DIAGNOSIS — R609 Edema, unspecified: Secondary | ICD-10-CM | POA: Insufficient documentation

## 2020-05-13 DIAGNOSIS — K7581 Nonalcoholic steatohepatitis (NASH): Secondary | ICD-10-CM | POA: Diagnosis present

## 2020-05-13 DIAGNOSIS — I81 Portal vein thrombosis: Secondary | ICD-10-CM | POA: Insufficient documentation

## 2020-05-13 LAB — AMMONIA: Ammonia: 24 umol/L (ref 9–35)

## 2020-05-13 LAB — CULTURE, BODY FLUID W GRAM STAIN -BOTTLE: Culture: NO GROWTH

## 2020-05-13 NOTE — Progress Notes (Signed)
Medical Nutrition Therapy:  Appt start time: 1300 end time:  1330   Assessment:  Primary concerns today: Cirrhosis of liver, NASH.Marland Kitchen LIves with his dad. "I'm worried about my kidneys."  He just got fluid taken off. Has lost 3 lbs since then. Fluid tap removed 10 lbs.He has been trying to avoid salt and processed foods. Trying to avoid cheese. LIkes tomatoes and lots of fresh or frozen vegetables. Eating good quality protein. No signs of hepatic encephalopathy reported. Has been using MRs. Dash. Been reading food labels and avoiding foods with more than 200 mg of sodium per servings. Avoiding bacon and sausage.     His arm and his neck don't hurt since he has had fluid tap done.  Albumin 3.4 mg/dl. Would benefit from a Prealbumin to better assess his nutritional status. Appetite is good.   CMP Latest Ref Rng & Units 05/04/2020 04/16/2020 04/08/2020  Glucose 70 - 99 mg/dL 101(H) 86 92  BUN 6 - 20 mg/dL 26(H) 36(H) 43(H)  Creatinine 0.61 - 1.24 mg/dL 1.39(H) 1.65(H) 1.98(H)  Sodium 135 - 145 mmol/L 126(L) 127(L) 130(L)  Potassium 3.5 - 5.1 mmol/L 5.1 5.1 5.4(H)  Chloride 98 - 111 mmol/L 93(L) 92(L) 94(L)  CO2 22 - 32 mmol/L 25 26 27   Calcium 8.9 - 10.3 mg/dL 9.1 9.2 9.7  Total Protein 6.5 - 8.1 g/dL - - 8.3(H)  Total Bilirubin 0.3 - 1.2 mg/dL - - 0.6  Alkaline Phos 38 - 126 U/L - - 68  AST 15 - 41 U/L - - 18  ALT 0 - 44 U/L - - 14   Wt Readings from Last 3 Encounters:  05/11/20 208 lb 12.8 oz (94.7 kg)  04/07/20 218 lb 12.8 oz (99.2 kg)  02/11/20 222 lb (100.7 kg)   Ht Readings from Last 3 Encounters:  05/11/20 5' 8"  (1.727 m)  04/07/20 5' 8"  (1.727 m)  02/11/20 5' 8"  (1.727 m)   There is no height or weight on file to calculate BMI. @BMIFA @ Facility age limit for growth percentiles is 20 years. Facility age limit for growth percentiles is 20 years.    Preferred Learning Style:  Auditory       Visual -not reading.  Hands on  Learning Readiness:   Ready  Change  in progress   MEDICATIONS:   DIETARY INTAKE:  24-hr recall:  B ( AM):  Egg omelet with veggies, milk,  Snk ( AM):  L ( PM): don't remember Snk ( PM):  D ( PM):  Pork chop, and salad, water, Snk ( PM): applesauce,  Beverages: water   Usual physical activity: walk some  Estimated energy needs: 1800-2000  calories 225 g carbohydrates 150  g protein 56 g fat  Progress Towards Goal(s):  In progress.   Nutritional Diagnosis:  NB-1.1 Food and nutrition-related knowledge deficit As related to Cirrhosis NASH.  As evidenced by Ascites.    Intervention:  Nutrition Cirrhosis education provided on My Plate, CHO counting, meal planning, portion sizes, timing of meals, Low salt diet, Cirrhosis Nutrition Therapy. Need for daily weights and calling MD with fluid weight gain. Low Potassium foods.   Goals  Healthy Choice or Smart Ones TV dinner for meals as needed. Eggs, toast and fruit for breakfast Keep Use Mrs Deliah Boston   Teaching Method Utilized:  Visual Auditory Hands on  Handouts given during visit include:  The Plate Method   Low Sodium Foods  Cirrhosis nutrition   Barriers to learning/adherence to lifestyle change: Cirrhosis.  Demonstrated  degree of understanding via:  Teach Back   Monitoring/Evaluation:  Dietary intake, exercise, , and body weight in 3 month(s). Recommend to check a prealbumin level.

## 2020-05-13 NOTE — Patient Instructions (Signed)
Goals  Healthy Choice or Smart Ones TV dinner for meals as needed. Eggs, toast and fruit for breakfast Keep Use Mrs Deliah Boston

## 2020-05-20 ENCOUNTER — Encounter: Payer: Self-pay | Admitting: Nutrition

## 2020-06-09 ENCOUNTER — Telehealth: Payer: Self-pay | Admitting: Internal Medicine

## 2020-06-09 NOTE — Telephone Encounter (Signed)
Pt said he was told to call us if he started gaining weight again. He said he went from 205 to 13. Please advise if he needs fluid drawn. [phone]

## 2020-06-09 NOTE — Telephone Encounter (Signed)
Spoke with pt. Pt is having some swelling in legs at times and distended abdomen. Pts weight went from 205 to 226 since his last para. Pt has no shortness of breath at this time. Pt is requesting a para.

## 2020-06-11 ENCOUNTER — Ambulatory Visit (HOSPITAL_COMMUNITY)
Admission: RE | Admit: 2020-06-11 | Discharge: 2020-06-11 | Disposition: A | Discharge status: Home / Self Care | Payer: Medicaid Other | Source: Ambulatory Visit | Attending: Nurse Practitioner | Admitting: Nurse Practitioner

## 2020-06-11 ENCOUNTER — Other Ambulatory Visit: Payer: Self-pay

## 2020-06-11 ENCOUNTER — Encounter (HOSPITAL_COMMUNITY): Payer: Self-pay

## 2020-06-11 DIAGNOSIS — R188 Other ascites: Secondary | ICD-10-CM | POA: Diagnosis present

## 2020-06-11 DIAGNOSIS — K7460 Unspecified cirrhosis of liver: Secondary | ICD-10-CM

## 2020-06-11 LAB — GRAM STAIN

## 2020-06-11 LAB — BODY FLUID CELL COUNT WITH DIFFERENTIAL
Eos, Fluid: 0 %
Lymphs, Fluid: 22 %
Monocyte-Macrophage-Serous Fluid: 64 % (ref 50–90)
Neutrophil Count, Fluid: 14 % (ref 0–25)
Other Cells, Fluid: 1 %
Total Nucleated Cell Count, Fluid: 525 cu mm (ref 0–1000)

## 2020-06-11 MED ORDER — SODIUM CHLORIDE FLUSH 0.9 % IV SOLN
INTRAVENOUS | Status: AC
  Filled 2020-06-11: qty 20

## 2020-06-11 MED ORDER — ALBUMIN HUMAN 25 % IV SOLN
INTRAVENOUS | Status: AC
  Filled 2020-06-11: qty 200

## 2020-06-11 MED ORDER — ALBUMIN HUMAN 25 % IV SOLN: 0.0000 g | Freq: Once | INTRAVENOUS | Status: DC

## 2020-06-11 NOTE — Telephone Encounter (Signed)
Spoke with pt. He is aware that our office is working on the para.

## 2020-06-11 NOTE — Telephone Encounter (Addendum)
Noted.   Mindy or Tretha Sciara: Please schedule patient for US Paracentesis (today if possible; I'll let U/S know to expect it coming across). Albumin per protocol. No max volume. Labs: Cell count and diff, culture, gram stain. Also, are we able to start paperwork for standing orders for him? If so you can put them on my keyboard and I'll sign next time I'm in the office.  Alicia: Please tell him we're working on getting him set up.  Thanks all.

## 2020-06-11 NOTE — Telephone Encounter (Addendum)
Para scheduled today for 1:15pm, arrive at 1:00pm.  Called and informed pt of para appt.  Eric, we can complete standing order paper for para. Let us know details of the order.

## 2020-06-11 NOTE — Sedation Documentation (Signed)
Patient tolerated right sided paracentesis procedure well today and 2.5 Liters of clear yellow fluid removed with labs collected and sent for processing. PT vitals signs remained stable at completion of procedure. Patient verbalized understanding of discharge instructions and will follow up as needed.

## 2020-06-11 NOTE — Discharge Instructions (Signed)
Paracentesis, Care After This sheet gives you information about how to care for yourself after your procedure. Your health care provider may also give you more specific instructions. If you have problems or questions, contact your health care provider. What can I expect after the procedure? After the procedure, it is common to have a small amount of clear fluid coming from the puncture site. Follow these instructions at home: Puncture site care   Follow instructions from your health care provider about how to take care of your puncture site. Make sure you: ? Wash your hands with soap and water before and after you change your bandage (dressing). If soap and water are not available, use hand sanitizer. ? Change your dressing as told by your health care provider.  Check your puncture area every day for signs of infection. Check for: ? Redness, swelling, or pain. ? More fluid or blood. ? Warmth. ? Pus or a bad smell. General instructions  Return to your normal activities as told by your health care provider. Ask your health care provider what activities are safe for you.  Take over-the-counter and prescription medicines only as told by your health care provider.  Do not take baths, swim, or use a hot tub until your health care provider approves. Ask your health care provider if you may take showers. You may only be allowed to take sponge baths.  Keep all follow-up visits as told by your health care provider. This is important. Contact a health care provider if:  You have redness, swelling, or pain at your puncture site.  You have more fluid or blood coming from your puncture site.  Your puncture site feels warm to the touch.  You have pus or a bad smell coming from your puncture site.  You have a fever. Get help right away if:  You have chest pain or shortness of breath.  You develop increasing pain, discomfort, or swelling in your abdomen.  You feel dizzy or light-headed or  you faint. Summary  After the procedure, it is common to have a small amount of clear fluid coming from the puncture site.  Follow instructions from your health care provider about how to take care of your puncture site.  Check your puncture area every day signs of infection.  Keep all follow-up visits as told by your health care provider. This information is not intended to replace advice given to you by your health care provider. Make sure you discuss any questions you have with your health care provider. Document Revised: 12/03/2018 Document Reviewed: 03/12/2018 Elsevier Patient Education  2020 [employer].

## 2020-06-11 NOTE — Procedures (Signed)
PreOperative Dx: Cirrhosis, ascites Postoperative Dx: Cirrhosis, ascites Procedure:   US guided paracentesis Radiologist:  Thornton Papas Anesthesia:  10 ml of1% lidocaine Specimen:  2.5 L of yellow ascitic fluid EBL:   < 1 ml Complications: None

## 2020-06-13 NOTE — Telephone Encounter (Signed)
Thanks!  Standing orders for para: no maximum unless concerns with vital signs; Cell count, gram stain, culture with each para; Albumin per protocol.  Let me know if any more info is needed. I can sign the orders when I'm back on Wednesday. Thanks!

## 2020-06-14 ENCOUNTER — Other Ambulatory Visit: Payer: Self-pay

## 2020-06-14 DIAGNOSIS — R188 Other ascites: Secondary | ICD-10-CM

## 2020-06-14 DIAGNOSIS — K7460 Unspecified cirrhosis of liver: Secondary | ICD-10-CM

## 2020-06-14 LAB — PATHOLOGIST SMEAR REVIEW

## 2020-06-16 LAB — CULTURE, BODY FLUID W GRAM STAIN -BOTTLE: Culture: NO GROWTH

## 2020-06-23 NOTE — Telephone Encounter (Signed)
Called and informed pt of standing para order. Gave him phone# to central scheduling.

## 2020-06-29 ENCOUNTER — Other Ambulatory Visit: Payer: Self-pay

## 2020-06-29 ENCOUNTER — Encounter (HOSPITAL_COMMUNITY): Payer: Self-pay

## 2020-06-29 ENCOUNTER — Ambulatory Visit (HOSPITAL_COMMUNITY)
Admission: RE | Admit: 2020-06-29 | Discharge: 2020-06-29 | Disposition: A | Discharge status: Home / Self Care | Payer: Medicaid Other | Source: Ambulatory Visit | Attending: Nurse Practitioner | Admitting: Nurse Practitioner

## 2020-06-29 DIAGNOSIS — R188 Other ascites: Secondary | ICD-10-CM | POA: Insufficient documentation

## 2020-06-29 DIAGNOSIS — K7460 Unspecified cirrhosis of liver: Secondary | ICD-10-CM | POA: Insufficient documentation

## 2020-06-29 LAB — BODY FLUID CELL COUNT WITH DIFFERENTIAL
Eos, Fluid: 1 %
Lymphs, Fluid: 34 %
Monocyte-Macrophage-Serous Fluid: 56 % (ref 50–90)
Neutrophil Count, Fluid: 9 % (ref 0–25)
Other Cells, Fluid: 1 %
Total Nucleated Cell Count, Fluid: 439 cu mm (ref 0–1000)

## 2020-06-29 LAB — GRAM STAIN

## 2020-06-29 MED ORDER — ALBUMIN HUMAN 25 % IV SOLN
0.0000 g | Freq: Once | INTRAVENOUS | Status: AC
  Administered 2020-06-29: 50 g via INTRAVENOUS

## 2020-06-29 NOTE — Sedation Documentation (Signed)
PT tolerated right sided paracentesis procedure well today and IV albumin administered. 5.7 Liters of clear yellow fluid removed with labs collected and sent for processing. Vital signs remained stable at completion of procedure and patient denied any complaints. PT ambulatory at discharge with no acute distress noted.

## 2020-06-29 NOTE — Discharge Instructions (Signed)
Paracentesis, Care After This sheet gives you information about how to care for yourself after your procedure. Your health care provider may also give you more specific instructions. If you have problems or questions, contact your health care provider. What can I expect after the procedure? After the procedure, it is common to have a small amount of clear fluid coming from the puncture site. Follow these instructions at home: Puncture site care  Follow instructions from your health care provider about how to take care of your puncture site. Make sure you: ? Wash your hands with soap and water before and after you change your bandage (dressing). If soap and water are not available, use hand sanitizer. ? Change your dressing as told by your health care provider.  Check your puncture area every day for signs of infection. Check for: ? Redness, swelling, or pain. ? More fluid or blood. ? Warmth. ? Pus or a bad smell.   General instructions  Return to your normal activities as told by your health care provider. Ask your health care provider what activities are safe for you.  Take over-the-counter and prescription medicines only as told by your health care provider.  Do not take baths, swim, or use a hot tub until your health care provider approves. Ask your health care provider if you may take showers. You may only be allowed to take sponge baths.  Keep all follow-up visits as told by your health care provider. This is important. Contact a health care provider if:  You have redness, swelling, or pain at your puncture site.  You have more fluid or blood coming from your puncture site.  Your puncture site feels warm to the touch.  You have pus or a bad smell coming from your puncture site.  You have a fever. Get help right away if:  You have chest pain or shortness of breath.  You develop increasing pain, discomfort, or swelling in your abdomen.  You feel dizzy or light-headed or  you faint. Summary  After the procedure, it is common to have a small amount of clear fluid coming from the puncture site.  Follow instructions from your health care provider about how to take care of your puncture site.  Check your puncture area every day signs of infection.  Keep all follow-up visits as told by your health care provider. This information is not intended to replace advice given to you by your health care provider. Make sure you discuss any questions you have with your health care provider. Document Revised: 12/03/2018 Document Reviewed: 03/12/2018 Elsevier Patient Education  2021 [employer].

## 2020-06-29 NOTE — Procedures (Signed)
PreOperative Dx: Cirrhosis, ascites Postoperative Dx: Cirrhosis, ascites Procedure:   US guided paracentesis Radiologist:  Thornton Papas Anesthesia:  10 ml of1% lidocaine Specimen:  5.7 L of clear yellow ascitic fluid EBL:   < 1 ml Complications: None

## 2020-06-30 LAB — PATHOLOGIST SMEAR REVIEW

## 2020-07-04 LAB — CULTURE, BODY FLUID W GRAM STAIN -BOTTLE: Culture: NO GROWTH

## 2020-07-08 ENCOUNTER — Ambulatory Visit: Payer: Medicaid Other | Admitting: Nurse Practitioner

## 2020-07-09 ENCOUNTER — Other Ambulatory Visit (HOSPITAL_COMMUNITY): Payer: Self-pay

## 2020-07-09 DIAGNOSIS — D696 Thrombocytopenia, unspecified: Secondary | ICD-10-CM

## 2020-07-12 ENCOUNTER — Inpatient Hospital Stay (HOSPITAL_COMMUNITY): Payer: Medicaid Other | Attending: Hematology

## 2020-07-12 ENCOUNTER — Other Ambulatory Visit: Payer: Self-pay

## 2020-07-12 DIAGNOSIS — R200 Anesthesia of skin: Secondary | ICD-10-CM | POA: Diagnosis not present

## 2020-07-12 DIAGNOSIS — E876 Hypokalemia: Secondary | ICD-10-CM | POA: Insufficient documentation

## 2020-07-12 DIAGNOSIS — Z79899 Other long term (current) drug therapy: Secondary | ICD-10-CM | POA: Insufficient documentation

## 2020-07-12 DIAGNOSIS — I10 Essential (primary) hypertension: Secondary | ICD-10-CM | POA: Insufficient documentation

## 2020-07-12 DIAGNOSIS — D696 Thrombocytopenia, unspecified: Secondary | ICD-10-CM | POA: Diagnosis not present

## 2020-07-12 LAB — CBC WITH DIFFERENTIAL/PLATELET
Abs Immature Granulocytes: 0.02 10*3/uL (ref 0.00–0.07)
Basophils Absolute: 0 10*3/uL (ref 0.0–0.1)
Basophils Relative: 0 %
Eosinophils Absolute: 0.3 10*3/uL (ref 0.0–0.5)
Eosinophils Relative: 4 %
HCT: 33.9 % — ABNORMAL LOW (ref 39.0–52.0)
Hemoglobin: 10.7 g/dL — ABNORMAL LOW (ref 13.0–17.0)
Immature Granulocytes: 0 %
Lymphocytes Relative: 8 %
Lymphs Abs: 0.6 10*3/uL — ABNORMAL LOW (ref 0.7–4.0)
MCH: 29.2 pg (ref 26.0–34.0)
MCHC: 31.6 g/dL (ref 30.0–36.0)
MCV: 92.6 fL (ref 80.0–100.0)
Monocytes Absolute: 1 10*3/uL (ref 0.1–1.0)
Monocytes Relative: 13 %
Neutro Abs: 5.7 10*3/uL (ref 1.7–7.7)
Neutrophils Relative %: 75 %
Platelets: 214 10*3/uL (ref 150–400)
RBC: 3.66 MIL/uL — ABNORMAL LOW (ref 4.22–5.81)
RDW: 15.1 % (ref 11.5–15.5)
WBC: 7.7 10*3/uL (ref 4.0–10.5)
nRBC: 0 % (ref 0.0–0.2)

## 2020-07-12 LAB — COMPREHENSIVE METABOLIC PANEL
ALT: 16 U/L (ref 0–44)
AST: 23 U/L (ref 15–41)
Albumin: 3.2 g/dL — ABNORMAL LOW (ref 3.5–5.0)
Alkaline Phosphatase: 74 U/L (ref 38–126)
Anion gap: 6 (ref 5–15)
BUN: 22 mg/dL — ABNORMAL HIGH (ref 6–20)
CO2: 27 mmol/L (ref 22–32)
Calcium: 9.1 mg/dL (ref 8.9–10.3)
Chloride: 93 mmol/L — ABNORMAL LOW (ref 98–111)
Creatinine, Ser: 1.31 mg/dL — ABNORMAL HIGH (ref 0.61–1.24)
GFR, Estimated: >60 mL/min (ref >60)
Glucose, Bld: 91 mg/dL (ref 70–99)
Potassium: 5.3 mmol/L — ABNORMAL HIGH (ref 3.5–5.1)
Sodium: 126 mmol/L — ABNORMAL LOW (ref 135–145)
Total Bilirubin: 0.6 mg/dL (ref 0.3–1.2)
Total Protein: 7.5 g/dL (ref 6.5–8.1)

## 2020-07-19 ENCOUNTER — Inpatient Hospital Stay (HOSPITAL_BASED_OUTPATIENT_CLINIC_OR_DEPARTMENT_OTHER): Payer: Medicaid Other | Admitting: Hematology

## 2020-07-19 ENCOUNTER — Ambulatory Visit (HOSPITAL_COMMUNITY)
Admission: RE | Admit: 2020-07-19 | Discharge: 2020-07-19 | Disposition: A | Discharge status: Home / Self Care | Payer: Medicaid Other | Source: Ambulatory Visit | Attending: Nurse Practitioner | Admitting: Nurse Practitioner

## 2020-07-19 ENCOUNTER — Encounter (HOSPITAL_COMMUNITY): Payer: Self-pay | Admitting: Hematology

## 2020-07-19 ENCOUNTER — Other Ambulatory Visit: Payer: Self-pay

## 2020-07-19 ENCOUNTER — Encounter (HOSPITAL_COMMUNITY): Payer: Self-pay

## 2020-07-19 VITALS — BP 113/66 | HR 75 | Temp 97.3°F | Resp 20 | Wt 219.9 lb

## 2020-07-19 DIAGNOSIS — E875 Hyperkalemia: Secondary | ICD-10-CM | POA: Diagnosis not present

## 2020-07-19 DIAGNOSIS — K7460 Unspecified cirrhosis of liver: Secondary | ICD-10-CM | POA: Diagnosis present

## 2020-07-19 DIAGNOSIS — R188 Other ascites: Secondary | ICD-10-CM | POA: Diagnosis present

## 2020-07-19 DIAGNOSIS — D696 Thrombocytopenia, unspecified: Secondary | ICD-10-CM

## 2020-07-19 LAB — BODY FLUID CELL COUNT WITH DIFFERENTIAL
Lymphs, Fluid: 17 %
Monocyte-Macrophage-Serous Fluid: 79 % (ref 50–90)
Neutrophil Count, Fluid: 4 % (ref 0–25)
Other Cells, Fluid: REACTIVE %
Total Nucleated Cell Count, Fluid: 350 cu mm (ref 0–1000)

## 2020-07-19 LAB — GRAM STAIN: Gram Stain: NONE SEEN

## 2020-07-19 MED ORDER — ALBUMIN HUMAN 25 % IV SOLN
INTRAVENOUS | Status: AC
  Administered 2020-07-19: 50 g via INTRAVENOUS
  Filled 2020-07-19: qty 200

## 2020-07-19 MED ORDER — ALBUMIN HUMAN 25 % IV SOLN
0.0000 g | Freq: Once | INTRAVENOUS | Status: AC
  Filled 2020-07-19: qty 400

## 2020-07-19 NOTE — Progress Notes (Signed)
Alejandro Porter, Alejandro Porter   CLINIC:  Medical Oncology/Hematology  PCP:  Sofie Rower, PA-C [address] / [place] [address]  [phone]  REASON FOR VISIT:  Follow-up for thrombocytopenia and hyperkalemia  PRIOR THERAPY: None  CURRENT THERAPY: Observation  INTERVAL HISTORY:  [name], a 45 y.o. male, returns for routine follow-up for his thrombocytopenia. Eman was last seen on 01/15/2020.  Today he reports feeling better. He had 7 L of ascitic fluid drained this AM. He reports having occasional numbness in his right leg. He continues taking Eliquis BID and denies having any nosebleeds, melena, hematochezia or hematuria. His appetite is excellent. He is taking Lasix and spironolactone and denies having any leg swelling.   REVIEW OF SYSTEMS:  Review of Systems  Constitutional: Positive for fatigue (50%). Negative for appetite change.  HENT:   Negative for nosebleeds.   Cardiovascular: Negative for leg swelling (on Lasix & spirolonactone).  Gastrointestinal: Positive for constipation (on lactulose). Negative for blood in stool.  Genitourinary: Negative for hematuria.   All other systems reviewed and are negative.   PAST MEDICAL/SURGICAL HISTORY:  Past Medical History:  Diagnosis Date  . H/O colonoscopy   . H/O endoscopy   . Hypertension   . Liver cirrhosis ([place])   . Neuropathy   . S/P abdominal paracentesis    Past Surgical History:  Procedure Laterality Date  . BIOPSY  09/12/2019   Procedure: BIOPSY;  Surgeon: Daneil Dolin, MD;  [facility];  Service: Endoscopy;;  . COLONOSCOPY N/A 09/18/2019   Procedure: COLONOSCOPY;  Surgeon: Danie Binder, MD;  [facility];  Service: Endoscopy;  Laterality: N/A;  . ESOPHAGEAL BANDING N/A 09/12/2019   Procedure: ESOPHAGEAL BANDING;  Surgeon: Daneil Dolin, MD;  [facility];  Service: Endoscopy;  Laterality: N/A;  .  ESOPHAGOGASTRODUODENOSCOPY N/A 09/12/2019   Procedure: ESOPHAGOGASTRODUODENOSCOPY (EGD);  Surgeon: Daneil Dolin, MD;  [facility];  Service: Endoscopy;  Laterality: N/A;  . POLYPECTOMY  09/18/2019   Procedure: POLYPECTOMY;  Surgeon: Danie Binder, MD;  [facility];  Service: Endoscopy;;    SOCIAL HISTORY:  Social History   Socioeconomic History  . Marital status: Single    Spouse name: Not on file  . Number of children: Not on file  . Years of education: Not on file  . Highest education level: Not on file  Occupational History  . Occupation: umemployed  Tobacco Use  . Smoking status: Never Smoker  . Smokeless tobacco: Never Used  Vaping Use  . Vaping Use: Never used  Substance and Sexual Activity  . Alcohol use: Never  . Drug use: Never  . Sexual activity: Not on file  Other Topics Concern  . Not on file  Social History Narrative  . Not on file   Social Determinants of Health   Financial Resource Strain: Not on file  Food Insecurity: Not on file  Transportation Needs: Not on file  Physical Activity: Not on file  Stress: Not on file  Social Connections: Not on file  Intimate Partner Violence: Not on file    FAMILY HISTORY:  Family History  Problem Relation Age of Onset  . Brain cancer Mother   . Diabetes Mother   . Diabetes Father   . Pulmonary embolism Brother   . Liver disease Neg Hx     CURRENT MEDICATIONS:  Current Outpatient Medications  Medication Sig  Dispense Refill  . apixaban (ELIQUIS) 5 MG TABS tablet Take 1 tablet (5 mg total) by mouth 2 (two) times daily. 60 tablet 5  . Cholecalciferol (VITAMIN D3) 125 MCG (5000 UT) TABS Take 1 tablet by mouth daily.    . Ferrous Sulfate (IRON) 325 (65 Fe) MG TABS Take 1 tablet (325 mg total) by mouth daily. 30 tablet 0  . furosemide (LASIX) 40 MG tablet Take 1 tablet (40 mg total) by mouth 2 (two) times daily. (Patient taking differently: Take 20 mg by mouth 2 (two) times daily.) 60  tablet 3  . gabapentin (NEURONTIN) 300 MG capsule Take 1 capsule (300 mg total) by mouth at bedtime. 30 capsule 6  . lactulose (CHRONULAC) 10 GM/15ML solution Take 15 mLs (10 g total) by mouth daily. (Patient taking differently: Take 20 g by mouth daily.) 946 mL 5  . Multiple Vitamin (MULTIVITAMIN) tablet Take 1 tablet by mouth daily.    Marland Kitchen omeprazole (PRILOSEC) 20 MG capsule Take 1 capsule (20 mg total) by mouth daily. 30 capsule 5  . propranolol (INDERAL) 20 MG tablet Take 1 tablet (20 mg total) by mouth 2 (two) times daily. 60 tablet 5  . rifaximin (XIFAXAN) 550 MG TABS tablet Take 1 tablet (550 mg total) by mouth 2 (two) times daily. 60 tablet 5  . spironolactone (ALDACTONE) 100 MG tablet Take 1 tablet (100 mg total) by mouth 2 (two) times daily with a meal. (Patient taking differently: Take 50 mg by mouth 2 (two) times daily with a meal.) 60 tablet 2   No current facility-administered medications for this visit.    ALLERGIES:  Allergies  Allergen Reactions  . Chlorthalidone   . Metoprolol     PHYSICAL EXAM:  Performance status (ECOG): 1 - Symptomatic but completely ambulatory  Vitals:   07/19/20 1436  BP: 113/66  Pulse: 75  Resp: 20  Temp: (!) 97.3 F (36.3 C)  SpO2: 100%   Wt Readings from Last 3 Encounters:  07/19/20 219 lb 14.4 oz (99.7 kg)  05/13/20 205 lb (93 kg)  05/11/20 208 lb 12.8 oz (94.7 kg)   Physical Exam Vitals reviewed.  Constitutional:      Appearance: Normal appearance. He is obese.  Cardiovascular:     Rate and Rhythm: Normal rate and regular rhythm.     Pulses: Normal pulses.     Heart sounds: Normal heart sounds.  Pulmonary:     Effort: Pulmonary effort is normal.     Breath sounds: Normal breath sounds.  Musculoskeletal:     Right lower leg: No edema.     Left lower leg: No edema.  Neurological:     General: No focal deficit present.     Mental Status: He is alert and oriented to person, place, and time.  Psychiatric:        Mood and  Affect: Mood normal.        Behavior: Behavior normal.     LABORATORY DATA:  I have reviewed the labs as listed.  CBC Latest Ref Rng & Units 07/12/2020 04/08/2020 02/12/2020  WBC 4.0 - 10.5 K/uL 7.7 6.1 6.1  Hemoglobin 13.0 - 17.0 g/dL 10.7(L) 9.1(L) 11.3(L)  Hematocrit 39.0 - 52.0 % 33.9(L) 28.9(L) 35.3(L)  Platelets 150 - 400 K/uL 214 158 157   CMP Latest Ref Rng & Units 07/12/2020 05/04/2020 04/16/2020  Glucose 70 - 99 mg/dL 91 101(H) 86  BUN 6 - 20 mg/dL 22(H) 26(H) 36(H)  Creatinine 0.61 - 1.24 mg/dL 1.31(H) 1.39(H)  1.65(H)  Sodium 135 - 145 mmol/L 126(L) 126(L) 127(L)  Potassium 3.5 - 5.1 mmol/L 5.3(H) 5.1 5.1  Chloride 98 - 111 mmol/L 93(L) 93(L) 92(L)  CO2 22 - 32 mmol/L 27 25 26   Calcium 8.9 - 10.3 mg/dL 9.1 9.1 9.2  Total Protein 6.5 - 8.1 g/dL 7.5 - -  Total Bilirubin 0.3 - 1.2 mg/dL 0.6 - -  Alkaline Phos 38 - 126 U/L 74 - -  AST 15 - 41 U/L 23 - -  ALT 0 - 44 U/L 16 - -      Component Value Date/Time   RBC 3.66 (L) 07/12/2020 1251   MCV 92.6 07/12/2020 1251   MCH 29.2 07/12/2020 1251   MCHC 31.6 07/12/2020 1251   RDW 15.1 07/12/2020 1251   LYMPHSABS 0.6 (L) 07/12/2020 1251   MONOABS 1.0 07/12/2020 1251   EOSABS 0.3 07/12/2020 1251   BASOSABS 0.0 07/12/2020 1251    DIAGNOSTIC IMAGING:  I have independently reviewed the scans and discussed with the patient. US Paracentesis  Result Date: 07/19/2020 INDICATION: Cirrhosis, ascites EXAM: ULTRASOUND GUIDED DIAGNOSTIC AND THERAPEUTIC PARACENTESIS MEDICATIONS: None. COMPLICATIONS: None immediate. PROCEDURE: Informed written consent was obtained from the patient after a discussion of the risks, benefits and alternatives to treatment. A timeout was performed prior to the initiation of the procedure. Initial ultrasound scanning demonstrates a large amount of ascites within the right lower abdominal quadrant. The right lower abdomen was prepped and draped in the usual sterile fashion. 1% lidocaine was used for local anesthesia.  Following this, a 5 [place] Yueh catheter was introduced. An ultrasound image was saved for documentation purposes. The paracentesis was performed. The catheter was removed and a dressing was applied. The patient tolerated the procedure well without immediate post procedural complication. Patient received post-procedure intravenous albumin; see nursing notes for details. FINDINGS: A total of approximately 7 L of yellow ascitic fluid was removed. Samples were sent to the laboratory as requested by the clinical team. IMPRESSION: Successful ultrasound-guided paracentesis yielding 7 liters of peritoneal fluid. Electronically Signed   By: Lavonia Dana M.D.   On: 07/19/2020 13:21   US Paracentesis  Result Date: 06/29/2020 INDICATION: Cirrhosis, ascites EXAM: ULTRASOUND GUIDED DIAGNOSTIC AND THERAPEUTIC PARACENTESIS MEDICATIONS: None COMPLICATIONS: None immediate PROCEDURE: Informed written consent was obtained from the patient after a discussion of the risks, benefits and alternatives to treatment. A timeout was performed prior to the initiation of the procedure. Initial ultrasound scanning demonstrates a large amount of ascites within the right lower abdominal quadrant. The right lower abdomen was prepped and draped in the usual sterile fashion. 1% lidocaine was used for local anesthesia. Following this, a 5 [place] Yueh catheter was introduced. An ultrasound image was saved for documentation purposes. The paracentesis was performed. The catheter was removed and a dressing was applied. The patient tolerated the procedure well without immediate post procedural complication. Patient received post-procedure intravenous albumin; see nursing notes for details. FINDINGS: A total of approximately 5.7 L of clear yellow ascitic fluid was removed. Samples were sent to the laboratory as requested by the clinical team. IMPRESSION: Successful ultrasound-guided paracentesis yielding 5.7 liters of peritoneal fluid. Electronically  Signed   By: Lavonia Dana M.D.   On: 06/29/2020 12:11     ASSESSMENT:  1. Mild to moderate thrombocytopenia: -Mild to moderate thrombocytopenia ranging from 90-1 27 since April 2021. -SPEP was negative. B12, methylmalonic acid, folic acid were normal. Hepatitis B was negative. H. pylori was negative. -Thrombocytopenia resolved on recent CBC on  10/20/2019. Platelet count was 323.  2. Portal vein thrombosis: -Ultrasound Doppler of the liver on 09/11/2019 showed nonocclusive hyperechoic thrombus along the main portal vein wall posteriorly extending into the right portal vein. -He is on Eliquis and is tolerating well.  3. New onset cirrhosis and ascites: -[facility] on 08/17/2019 showed liver morphology consistent with cirrhosis with no masses. Enlarged spleen measuring 22 cm. Nonocclusive thrombus in the portal vein. Prominent to mildly enlarged periceliac and gastrohepatic ligament lymph nodes. 3.5 cm left adrenal mass consistent with adenoma.   PLAN:  1. Mild to moderate thrombocytopenia: -CBC on 07/12/2020 shows normal platelet count of 214. -He does have normocytic anemia with hemoglobin of 10.7 and MCV of 92.6. -I plan to repeat labs in 3 months along with anemia labs.  2. Portal vein thrombosis: -Continue Eliquis.  No bleeding reported.  3. New onset cirrhosis and ascites: -Continue Lasix and spironolactone. -Paracentesis on 07/19/2020 with 7 L fluid removed.  4. Mild hyperkalemia: -Potassium today is 5.3. -I have told him to stop drinking cranberry juice daily and drink water instead.  Orders placed this encounter:  No orders of the defined types were placed in this encounter.    Derek Jack, MD [address] [phone]   I, Milinda Antis, am acting as a scribe for Dr. Sanda Linger.  I, Derek Jack MD, have reviewed the above documentation for accuracy and completeness, and I agree with the  above.

## 2020-07-19 NOTE — Sedation Documentation (Signed)
Patient tolerated right sided paracentesis procedure well today and 7 liters of dark straw colored clear fluid removed with labs collected and sent for processing. PT tolerated IV start and 50 G of albumin as well. PT ambulatory at discharge with no acute distress noted and denied any complaints. PT verbalized understanding of discharge instructions and will follow up as needed.

## 2020-07-19 NOTE — Patient Instructions (Signed)
[place] at [facility] Discharge Instructions  You were seen today by Dr. Delton Coombes. He went over your recent results. Cut down your cranberry juice to just 1 glass per day and drink water 1-2 liters daily. Dr. Delton Coombes will see you back in 3 months for labs and follow up.   Thank you for choosing [place] at [facility] to provide your oncology and hematology care.  To afford each patient quality time with our provider, please arrive at least 15 minutes before your scheduled appointment time.   If you have a lab appointment with the [facility] please come in thru the Main Entrance and check in at the main information desk  You need to re-schedule your appointment should you arrive 10 or more minutes late.  We strive to give you quality time with our providers, and arriving late affects you and other patients whose appointments are after yours.  Also, if you no show three or more times for appointments you may be dismissed from the clinic at the providers discretion.     Again, thank you for choosing [facility].  Our hope is that these requests will decrease the amount of time that you wait before being seen by our physicians.       _____________________________________________________________  Should you have questions after your visit to [facility], please contact our office at [phone] between the hours of 8:00 a.m. and 4:30 p.m.  Voicemails left after 4:00 p.m. will not be returned until the following business day.  For prescription refill requests, have your pharmacy contact our office and allow 72 hours.    Cancer Center Support Programs:   > Cancer Support Group  2nd Tuesday of the month 1pm-2pm, Journey Room

## 2020-07-19 NOTE — Discharge Instructions (Signed)
Paracentesis, Care After This sheet gives you information about how to care for yourself after your procedure. Your health care provider may also give you more specific instructions. If you have problems or questions, contact your health care provider. What can I expect after the procedure? After the procedure, it is common to have a small amount of clear fluid coming from the puncture site. Follow these instructions at home: Puncture site care  Follow instructions from your health care provider about how to take care of your puncture site. Make sure you: ? Wash your hands with soap and water before and after you change your bandage (dressing). If soap and water are not available, use hand sanitizer. ? Change your dressing as told by your health care provider.  Check your puncture area every day for signs of infection. Check for: ? Redness, swelling, or pain. ? More fluid or blood. ? Warmth. ? Pus or a bad smell.   General instructions  Return to your normal activities as told by your health care provider. Ask your health care provider what activities are safe for you.  Take over-the-counter and prescription medicines only as told by your health care provider.  Do not take baths, swim, or use a hot tub until your health care provider approves. Ask your health care provider if you may take showers. You may only be allowed to take sponge baths.  Keep all follow-up visits as told by your health care provider. This is important. Contact a health care provider if:  You have redness, swelling, or pain at your puncture site.  You have more fluid or blood coming from your puncture site.  Your puncture site feels warm to the touch.  You have pus or a bad smell coming from your puncture site.  You have a fever. Get help right away if:  You have chest pain or shortness of breath.  You develop increasing pain, discomfort, or swelling in your abdomen.  You feel dizzy or light-headed or  you faint. Summary  After the procedure, it is common to have a small amount of clear fluid coming from the puncture site.  Follow instructions from your health care provider about how to take care of your puncture site.  Check your puncture area every day signs of infection.  Keep all follow-up visits as told by your health care provider. This information is not intended to replace advice given to you by your health care provider. Make sure you discuss any questions you have with your health care provider. Document Revised: 12/03/2018 Document Reviewed: 03/12/2018 Elsevier Patient Education  2021 [employer].

## 2020-07-19 NOTE — Procedures (Signed)
PreOperative Dx: Cirrhosis, ascites Postoperative Dx: Cirrhosis, ascites Procedure:   US guided paracentesis Radiologist:  Thornton Papas Anesthesia:  10 ml of 1% lidocaine Specimen:  7 L of yellow ascitic fluid EBL:   < 1 ml Complications: None

## 2020-07-24 LAB — CULTURE, BODY FLUID W GRAM STAIN -BOTTLE: Culture: NO GROWTH

## 2020-08-04 ENCOUNTER — Encounter (HOSPITAL_COMMUNITY): Payer: Self-pay

## 2020-08-04 ENCOUNTER — Other Ambulatory Visit: Payer: Self-pay

## 2020-08-04 ENCOUNTER — Ambulatory Visit (HOSPITAL_COMMUNITY)
Admission: RE | Admit: 2020-08-04 | Discharge: 2020-08-04 | Disposition: A | Discharge status: Home / Self Care | Payer: Medicaid Other | Source: Ambulatory Visit | Attending: Nurse Practitioner | Admitting: Nurse Practitioner

## 2020-08-04 DIAGNOSIS — K7460 Unspecified cirrhosis of liver: Secondary | ICD-10-CM

## 2020-08-04 DIAGNOSIS — R188 Other ascites: Secondary | ICD-10-CM | POA: Insufficient documentation

## 2020-08-04 LAB — GRAM STAIN

## 2020-08-04 LAB — BODY FLUID CELL COUNT WITH DIFFERENTIAL
Lymphs, Fluid: 29 %
Monocyte-Macrophage-Serous Fluid: 65 % (ref 50–90)
Neutrophil Count, Fluid: 6 % (ref 0–25)
Other Cells, Fluid: REACTIVE %
Total Nucleated Cell Count, Fluid: 432 cu mm (ref 0–1000)

## 2020-08-04 MED ORDER — ALBUMIN HUMAN 25 % IV SOLN
INTRAVENOUS | Status: AC
  Filled 2020-08-04: qty 200

## 2020-08-04 MED ORDER — ALBUMIN HUMAN 25 % IV SOLN: 0.0000 g | Freq: Once | INTRAVENOUS | Status: DC

## 2020-08-04 NOTE — Procedures (Signed)
   US guided RLQ paracentesis  4.4 L yellow fluid obtained Sent for labs per MD Tolerated well  EBL: None

## 2020-08-04 NOTE — Sedation Documentation (Signed)
PT tolerated right sided paracentesis procedure well today and 4.4 Liters of clear yellow fluid removed with labs collected and sent for processing. PT verbalized understanding of discharge instructions and ambulatory at discharge and denied any complaints.

## 2020-08-04 NOTE — Discharge Instructions (Signed)
Paracentesis, Care After This sheet gives you information about how to care for yourself after your procedure. Your health care provider may also give you more specific instructions. If you have problems or questions, contact your health care provider. What can I expect after the procedure? After the procedure, it is common to have a small amount of clear fluid coming from the puncture site. Follow these instructions at home: Puncture site care  Follow instructions from your health care provider about how to take care of your puncture site. Make sure you: ? Wash your hands with soap and water before and after you change your bandage (dressing). If soap and water are not available, use hand sanitizer. ? Change your dressing as told by your health care provider.  Check your puncture area every day for signs of infection. Check for: ? Redness, swelling, or pain. ? More fluid or blood. ? Warmth. ? Pus or a bad smell.   General instructions  Return to your normal activities as told by your health care provider. Ask your health care provider what activities are safe for you.  Take over-the-counter and prescription medicines only as told by your health care provider.  Do not take baths, swim, or use a hot tub until your health care provider approves. Ask your health care provider if you may take showers. You may only be allowed to take sponge baths.  Keep all follow-up visits as told by your health care provider. This is important. Contact a health care provider if:  You have redness, swelling, or pain at your puncture site.  You have more fluid or blood coming from your puncture site.  Your puncture site feels warm to the touch.  You have pus or a bad smell coming from your puncture site.  You have a fever. Get help right away if:  You have chest pain or shortness of breath.  You develop increasing pain, discomfort, or swelling in your abdomen.  You feel dizzy or light-headed or  you faint. Summary  After the procedure, it is common to have a small amount of clear fluid coming from the puncture site.  Follow instructions from your health care provider about how to take care of your puncture site.  Check your puncture area every day signs of infection.  Keep all follow-up visits as told by your health care provider. This information is not intended to replace advice given to you by your health care provider. Make sure you discuss any questions you have with your health care provider. Document Revised: 12/03/2018 Document Reviewed: 03/12/2018 Elsevier Patient Education  2021 [employer].

## 2020-08-05 ENCOUNTER — Encounter: Payer: Self-pay | Admitting: Internal Medicine

## 2020-08-05 ENCOUNTER — Encounter: Payer: Self-pay | Admitting: Nurse Practitioner

## 2020-08-05 ENCOUNTER — Telehealth: Payer: Self-pay

## 2020-08-05 ENCOUNTER — Other Ambulatory Visit: Payer: Self-pay

## 2020-08-05 ENCOUNTER — Ambulatory Visit: Payer: Medicaid Other | Admitting: Nurse Practitioner

## 2020-08-05 VITALS — BP 120/79 | HR 77 | Temp 97.1°F | Ht 68.0 in | Wt 224.0 lb

## 2020-08-05 DIAGNOSIS — K7460 Unspecified cirrhosis of liver: Secondary | ICD-10-CM

## 2020-08-05 DIAGNOSIS — R1310 Dysphagia, unspecified: Secondary | ICD-10-CM | POA: Insufficient documentation

## 2020-08-05 DIAGNOSIS — K7290 Hepatic failure, unspecified without coma: Secondary | ICD-10-CM | POA: Diagnosis not present

## 2020-08-05 DIAGNOSIS — K766 Portal hypertension: Secondary | ICD-10-CM

## 2020-08-05 DIAGNOSIS — R109 Unspecified abdominal pain: Secondary | ICD-10-CM | POA: Insufficient documentation

## 2020-08-05 DIAGNOSIS — K7682 Hepatic encephalopathy: Secondary | ICD-10-CM

## 2020-08-05 DIAGNOSIS — R609 Edema, unspecified: Secondary | ICD-10-CM

## 2020-08-05 DIAGNOSIS — I81 Portal vein thrombosis: Secondary | ICD-10-CM

## 2020-08-05 DIAGNOSIS — R188 Other ascites: Secondary | ICD-10-CM

## 2020-08-05 DIAGNOSIS — K5900 Constipation, unspecified: Secondary | ICD-10-CM

## 2020-08-05 DIAGNOSIS — R1319 Other dysphagia: Secondary | ICD-10-CM

## 2020-08-05 DIAGNOSIS — D696 Thrombocytopenia, unspecified: Secondary | ICD-10-CM

## 2020-08-05 DIAGNOSIS — R1084 Generalized abdominal pain: Secondary | ICD-10-CM

## 2020-08-05 LAB — PATHOLOGIST SMEAR REVIEW

## 2020-08-05 NOTE — Telephone Encounter (Signed)
PA for CT abd/pelvis w/contrast submitted via [employer] website. Case pending. Clinical notes uploaded. Request tracking ID: [id number].

## 2020-08-05 NOTE — Progress Notes (Signed)
Referring Provider: Sofie Rower, PA-C Primary Care Physician:  Sofie Rower, PA-C Primary GI:  Dr. Gala Romney  Chief Complaint  Patient presents with  . Cirrhosis    F/u. Sometimes when he has a BM will have to strain. Had para done yesterday    HPI:   Alejandro Porter is a 45 y.o. male who presents for follow-up on cirrhosis.  Patient was last seen in our office 05/11/2020.  Noted history of cirrhosis, portal hypertension, PVT, thrombocytopenia, lower extremity edema, constipation.  Incidental diagnosis of cirrhosis when he presented to our office for anasarca and massive ascites requiring hospitalization and diuresis of the course of 2 to 3 weeks.  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  Grade 1/grade 2 esophageal varices on previous EGD for which she was started on a beta-blocker.  He has been doing well with appropriate lifestyle choices including low-salt diet, completing hepatitis A/B vaccine series, medication compliance.  Creatinine elevated to 1.6 at one-point and recommended hold Aldactone for 2 days and then recheck labs.  At his last visit he noted he was doing better since a recent paracentesis.  No further abdominal swelling, noted some increased lower extremity edema.  Nephrology has reduced diuretics due to kidney function.  Has about 3 bowel movements a day on increased dose of lactulose, still on Xifaxan and Eliquis with ongoing follow-up with hematology.  No other overt GI or hepatic complaints.  Variceal screening last completed 09/12/2019 and will next be due in 2023.  Recommended checking ammonia (concerns for some forgetfulness), increase lactulose with goal of 4-5 soft stools a day, increase propranolol to 20 mg in the morning and 40 mg in the afternoon for goal heart rate of 55-60, follow-up visit in 1 week for blood pressure and heart rate check as well as weight check, notify us of any significant weight gain (more than 5 pounds over 2 days)  and follow-up in 3 months otherwise.  Ammonia was completed 05/13/2020 and found to be normal.  Recently he has been having more frequent paracentesis, averaging about one a month (he has standing order for paracentesis). Last paracentesis was yesterday and had 4.4 L removed.  Every lab result from paracentesis reviewed and no indication of SBP.  Today he states he's doing ok overall. He states he hasn't been doing as well recently. Has had a chronic cough and notes solid food dysphagia. His energy and strength hasn't been doing as well. His cousin puts his medications in a pill-box to help him keep up with his medications. Has been having more issues with his right inguinal hernia. Has had some LE edema L>R, also with pain and discomfort in his RLE. He gets intermittent abdominal swelling for which he has been getting paracentesis for. Takes lactulose twice a day, if he takes too much of it it'll make him sick. Also on Xifaxan 577m bid. Has about 2 bowel movements a day. Some straining. Discussed increasing frequency of lactulose. Intermittent abdominal pain, sometimes severe; relatively new started about a month ago. Pain is intermittent, postprandial. Feels his memory is getting worse. Denies fever, chills, unintentional weight loss. Denies URI or flu-like symptoms. Denies loss of sense of taste or smell. The patient has not received COVID-19 vaccination(s). Denies chest pain, dyspnea, dizziness, lightheadedness, syncope, near syncope. Denies any other upper or lower GI symptoms.  Just saw hematology 07/19/20.   Past Medical History:  Diagnosis Date  . H/O colonoscopy   . H/O endoscopy   .  Hypertension   . Liver cirrhosis ([place])   . Neuropathy   . S/P abdominal paracentesis     Past Surgical History:  Procedure Laterality Date  . BIOPSY  09/12/2019   Procedure: BIOPSY;  Surgeon: Daneil Dolin, MD;  [facility];  Service: Endoscopy;;  . COLONOSCOPY N/A 09/18/2019   Procedure:  COLONOSCOPY;  Surgeon: Danie Binder, MD;  [facility]: [facility];  Service: Endoscopy;  Laterality: N/A;  . ESOPHAGEAL BANDING N/A 09/12/2019   Procedure: ESOPHAGEAL BANDING;  Surgeon: Daneil Dolin, MD;  [facility]: [facility];  Service: Endoscopy;  Laterality: N/A;  . ESOPHAGOGASTRODUODENOSCOPY N/A 09/12/2019   Procedure: ESOPHAGOGASTRODUODENOSCOPY (EGD);  Surgeon: Daneil Dolin, MD;  [facility];  Service: Endoscopy;  Laterality: N/A;  . POLYPECTOMY  09/18/2019   Procedure: POLYPECTOMY;  Surgeon: Danie Binder, MD;  [facility]: [facility];  Service: Endoscopy;;    Current Outpatient Medications  Medication Sig Dispense Refill  . apixaban (ELIQUIS) 5 MG TABS tablet Take 1 tablet (5 mg total) by mouth 2 (two) times daily. 60 tablet 5  . Cholecalciferol (VITAMIN D3) 125 MCG (5000 UT) TABS Take 1 tablet by mouth daily.    . Ferrous Sulfate (IRON) 325 (65 Fe) MG TABS Take 1 tablet (325 mg total) by mouth daily. 30 tablet 0  . furosemide (LASIX) 40 MG tablet Take 1 tablet (40 mg total) by mouth 2 (two) times daily. (Patient taking differently: Take 20 mg by mouth 2 (two) times daily.) 60 tablet 3  . gabapentin (NEURONTIN) 300 MG capsule Take 1 capsule (300 mg total) by mouth at bedtime. 30 capsule 6  . lactulose (CHRONULAC) 10 GM/15ML solution Take 15 mLs (10 g total) by mouth daily. (Patient taking differently: Take 20 g by mouth 2 (two) times daily.) 946 mL 5  . Multiple Vitamin (MULTIVITAMIN) tablet Take 1 tablet by mouth daily.    Marland Kitchen omeprazole (PRILOSEC) 20 MG capsule Take 1 capsule (20 mg total) by mouth daily. 30 capsule 5  . propranolol (INDERAL) 20 MG tablet Take 1 tablet (20 mg total) by mouth 2 (two) times daily. 60 tablet 5  . rifaximin (XIFAXAN) 550 MG TABS tablet Take 1 tablet (550 mg total) by mouth 2 (two) times daily. 60 tablet 5  . spironolactone (ALDACTONE) 100 MG tablet Take 1 tablet (100 mg total) by mouth 2 (two) times daily with a meal. (Patient taking  differently: Take 50 mg by mouth 2 (two) times daily with a meal.) 60 tablet 2   No current facility-administered medications for this visit.    Allergies as of 08/05/2020 - Review Complete 08/05/2020  Allergen Reaction Noted  . Chlorthalidone  11/04/2019  . Metoprolol  11/04/2019    Family History  Problem Relation Age of Onset  . Brain cancer Mother   . Diabetes Mother   . Diabetes Father   . Pulmonary embolism Brother   . Liver disease Neg Hx     Social History   Socioeconomic History  . Marital status: Single    Spouse name: Not on file  . Number of children: Not on file  . Years of education: Not on file  . Highest education level: Not on file  Occupational History  . Occupation: umemployed  Tobacco Use  . Smoking status: Never Smoker  . Smokeless tobacco: Never Used  Vaping Use  . Vaping Use: Never used  Substance and Sexual Activity  . Alcohol use: Never  . Drug use: Never  .  Sexual activity: Not on file  Other Topics Concern  . Not on file  Social History Narrative  . Not on file   Social Determinants of Health   Financial Resource Strain: Not on file  Food Insecurity: Not on file  Transportation Needs: Not on file  Physical Activity: Not on file  Stress: Not on file  Social Connections: Not on file    Subjective: Review of Systems  Constitutional: Positive for malaise/fatigue. Negative for chills, fever and weight loss.  HENT: Negative for congestion and sore throat.   Respiratory: Positive for cough. Negative for shortness of breath.   Cardiovascular: Positive for leg swelling. Negative for chest pain and palpitations.  Gastrointestinal: Positive for abdominal pain, constipation and nausea. Negative for blood in stool, diarrhea, heartburn, melena and vomiting.  Musculoskeletal: Negative for joint pain and myalgias.  Skin: Negative for rash.  Neurological: Negative for dizziness and weakness.  Endo/Heme/Allergies: Does not bruise/bleed easily.   Psychiatric/Behavioral: Positive for memory loss. Negative for depression. The patient is not nervous/anxious.   All other systems reviewed and are negative.    Objective: BP 120/79   Pulse 77   Temp (!) 97.1 F (36.2 C)   Ht 5' 8"  (1.727 m)   Wt 224 lb (101.6 kg)   BMI 34.06 kg/m  Physical Exam Vitals and nursing note reviewed.  Constitutional:      General: He is not in acute distress.    Appearance: Normal appearance. He is obese. He is ill-appearing. He is not toxic-appearing or diaphoretic.     Comments: Appears tired  HENT:     Head: Normocephalic and atraumatic.     Nose: No congestion or rhinorrhea.  Eyes:     General: No scleral icterus. Cardiovascular:     Rate and Rhythm: Normal rate and regular rhythm.     Heart sounds: Normal heart sounds.  Pulmonary:     Effort: Pulmonary effort is normal.     Breath sounds: Normal breath sounds.  Abdominal:     General: Bowel sounds are normal. There is no distension.     Palpations: Abdomen is soft. There is no hepatomegaly, splenomegaly or mass.     Tenderness: There is generalized abdominal tenderness. There is no guarding or rebound.     Hernia: A hernia is present. Hernia is present in the right inguinal area.  Musculoskeletal:     Cervical back: Neck supple.  Skin:    General: Skin is warm and dry.     Coloration: Skin is not jaundiced.     Findings: No bruising or rash.  Neurological:     General: No focal deficit present.     Mental Status: He is alert and oriented to person, place, and time. Mental status is at baseline.  Psychiatric:        Mood and Affect: Mood normal.        Behavior: Behavior normal.        Thought Content: Thought content normal.      Assessment:  Very pleasant, medically complex 45 year old male presents for follow-up on cirrhosis and multiple sequela, as outlined in HPI.  It appears lately he has not been doing so well.    Cirrhosis with esophageal varices, edema, ascites,  hepatic encephalopathy, partially occlusive portal venous thrombus, portal hypertension: He has increasing fatigue, weakness.  Still some memory issues.  He is on lactulose twice a day and states that he tries to take larger amounts of lactulose it makes him sick.  He has not tried 3 times a day or 4 times a day dosing.  He does have some increasing frequency of abdominal distention requiring paracentesis every 3 to 4 weeks.  He does have some intermittent lower extremity edema.  Increase creatinine has necessitated nephrology to reduce his diuretic dosage, which unfortunately puts him in a tight spot.  He does have increasing fatigue and weakness.  On exam today he does appear tired.  He does have generalized abdominal pain that has been ongoing for about the past month and is intermittent.  Recent paracentesis with labs done indicative of SBP.  However, he did just have a paracentesis yesterday and we will await labs for this.  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  At this point I will recommend he increase his lactulose to 3 times a day or 4 times a day, if tolerated.  I will check a CT of the abdomen and pelvis to evaluate his hepatic parenchyma as well as evaluate for causes of his abdominal pain and the status of his partial occlusive PVT on Eliquis.  I will update his chronic cirrhosis labs.  I will follow for his paracentesis labs from yesterday.  Dysphagia: Most recent dysphagia with solid foods and occasionally liquids.  No overt GERD symptoms.  He did have an EGD about a year ago and is technically up-to-date for variceal screening.  At this point, he did just have an EGD a year ago with no acute findings.  He did have esophageal varices.  I will check a barium pill esophagram to further evaluate his swallowing difficulties and we can consider EGD depending on results.  Recommended soft foods in the interim.   Plan: 1. CBC, CMP, INR, AFP 2. CT of  the abdomen and pelvis with contrast 3. Increase lactulose to 3 times a day/4 times a day as tolerated 4. Barium pill esophagram 5. Dysphagia 3 diet 6. Referral to [facility] liver clinic with Roosevelt Locks, NP 7. Follow-up in 6 months 8. ER precautions given    Thank you for allowing [place] to participate in the care of Jsiah Onnie Graham, DNP, AGNP-C Adult & Gerontological Nurse Practitioner [facility]   08/05/2020 1:36 PM   Disclaimer: This note was dictated with voice recognition software. Similar sounding words can inadvertently be transcribed and may not be corrected upon review.

## 2020-08-05 NOTE — Patient Instructions (Signed)
Your health issues we discussed today were:   Cirrhosis and complications of cirrhosis: 1. Have your labs drawn as soon as you can 2. We will help schedule your CT scan of your abdomen 3. Increase your lactulose to 3 times a day or 4 times a day as much as you are able to tolerate 4. We will refer you to the liver clinic in [place] with Roosevelt Locks, NP 5. Further recommendations will follow  Swallowing difficulties: 1. I would like to do a swallowing test to evaluate your swallowing function and for any narrowing in your swallowing tube 2. We can consider an upper endoscopy depending on the results of your test 3. Further recommendations will follow  Overall I recommend:  1. Continue other current medications 2. Return for follow-up in 6 months 3. Call us for any questions or concerns   ---------------------------------------------------------------  I am glad you have gotten your COVID-19 vaccination!  Even though you are fully vaccinated you should continue to follow CDC and state/local guidelines.  ---------------------------------------------------------------   At [facility] we value your feedback. You may receive a survey about your visit today. Please share your experience as we strive to create trusting relationships with our patients to provide genuine, compassionate, quality care.  We appreciate your understanding and patience as we review any laboratory studies, imaging, and other diagnostic tests that are ordered as we care for you. Our office policy is 5 business days for review of these results, and any emergent or urgent results are addressed in a timely manner for your best interest. If you do not hear from our office in 1 week, please contact us.   We also encourage the use of MyChart, which contains your medical information for your review as well. If you are not enrolled in this feature, an access code is on this after visit summary for your  convenience. Thank you for allowing [place] to be involved in your care.  It was great to see you today!  I hope you have a great spring!!

## 2020-08-06 ENCOUNTER — Other Ambulatory Visit (HOSPITAL_COMMUNITY)
Admission: RE | Admit: 2020-08-06 | Discharge: 2020-08-06 | Disposition: A | Discharge status: Home / Self Care | Payer: Medicaid Other | Source: Ambulatory Visit | Attending: Nurse Practitioner | Admitting: Nurse Practitioner

## 2020-08-06 ENCOUNTER — Other Ambulatory Visit: Payer: Self-pay

## 2020-08-06 DIAGNOSIS — R188 Other ascites: Secondary | ICD-10-CM | POA: Insufficient documentation

## 2020-08-06 DIAGNOSIS — K765 Hepatic veno-occlusive disease: Secondary | ICD-10-CM | POA: Diagnosis present

## 2020-08-06 DIAGNOSIS — R1319 Other dysphagia: Secondary | ICD-10-CM | POA: Diagnosis present

## 2020-08-06 DIAGNOSIS — K5900 Constipation, unspecified: Secondary | ICD-10-CM | POA: Insufficient documentation

## 2020-08-06 DIAGNOSIS — R1084 Generalized abdominal pain: Secondary | ICD-10-CM | POA: Diagnosis present

## 2020-08-06 DIAGNOSIS — D696 Thrombocytopenia, unspecified: Secondary | ICD-10-CM | POA: Insufficient documentation

## 2020-08-06 DIAGNOSIS — K7290 Hepatic failure, unspecified without coma: Secondary | ICD-10-CM | POA: Insufficient documentation

## 2020-08-06 DIAGNOSIS — R609 Edema, unspecified: Secondary | ICD-10-CM | POA: Insufficient documentation

## 2020-08-06 DIAGNOSIS — K7460 Unspecified cirrhosis of liver: Secondary | ICD-10-CM | POA: Insufficient documentation

## 2020-08-06 DIAGNOSIS — I81 Portal vein thrombosis: Secondary | ICD-10-CM | POA: Diagnosis present

## 2020-08-06 LAB — CBC WITH DIFFERENTIAL/PLATELET
Abs Immature Granulocytes: 0.03 10*3/uL (ref 0.00–0.07)
Basophils Absolute: 0 10*3/uL (ref 0.0–0.1)
Basophils Relative: 0 %
Eosinophils Absolute: 0.2 10*3/uL (ref 0.0–0.5)
Eosinophils Relative: 3 %
HCT: 31.6 % — ABNORMAL LOW (ref 39.0–52.0)
Hemoglobin: 9.8 g/dL — ABNORMAL LOW (ref 13.0–17.0)
Immature Granulocytes: 1 %
Lymphocytes Relative: 9 %
Lymphs Abs: 0.6 10*3/uL — ABNORMAL LOW (ref 0.7–4.0)
MCH: 28.6 pg (ref 26.0–34.0)
MCHC: 31 g/dL (ref 30.0–36.0)
MCV: 92.1 fL (ref 80.0–100.0)
Monocytes Absolute: 0.8 10*3/uL (ref 0.1–1.0)
Monocytes Relative: 13 %
Neutro Abs: 4.8 10*3/uL (ref 1.7–7.7)
Neutrophils Relative %: 74 %
Platelets: 173 10*3/uL (ref 150–400)
RBC: 3.43 MIL/uL — ABNORMAL LOW (ref 4.22–5.81)
RDW: 14.6 % (ref 11.5–15.5)
WBC: 6.4 10*3/uL (ref 4.0–10.5)
nRBC: 0 % (ref 0.0–0.2)

## 2020-08-06 LAB — COMPREHENSIVE METABOLIC PANEL
ALT: 16 U/L (ref 0–44)
AST: 22 U/L (ref 15–41)
Albumin: 3.3 g/dL — ABNORMAL LOW (ref 3.5–5.0)
Alkaline Phosphatase: 78 U/L (ref 38–126)
Anion gap: 7 (ref 5–15)
BUN: 23 mg/dL — ABNORMAL HIGH (ref 6–20)
CO2: 24 mmol/L (ref 22–32)
Calcium: 8.8 mg/dL — ABNORMAL LOW (ref 8.9–10.3)
Chloride: 97 mmol/L — ABNORMAL LOW (ref 98–111)
Creatinine, Ser: 1.28 mg/dL — ABNORMAL HIGH (ref 0.61–1.24)
GFR, Estimated: >60 mL/min (ref >60)
Glucose, Bld: 101 mg/dL — ABNORMAL HIGH (ref 70–99)
Potassium: 5.2 mmol/L — ABNORMAL HIGH (ref 3.5–5.1)
Sodium: 128 mmol/L — ABNORMAL LOW (ref 135–145)
Total Bilirubin: 0.6 mg/dL (ref 0.3–1.2)
Total Protein: 7.1 g/dL (ref 6.5–8.1)

## 2020-08-06 LAB — PROTIME-INR
INR: 1.3 — ABNORMAL HIGH (ref 0.8–1.2)
Prothrombin Time: 16.1 seconds — ABNORMAL HIGH (ref 11.4–15.2)

## 2020-08-07 LAB — AFP TUMOR MARKER: AFP, Serum, Tumor Marker: 1.6 ng/mL (ref 0.0–8.3)

## 2020-08-09 LAB — CULTURE, BODY FLUID W GRAM STAIN -BOTTLE: Culture: NO GROWTH

## 2020-08-09 NOTE — Telephone Encounter (Signed)
PA pending

## 2020-08-10 ENCOUNTER — Ambulatory Visit (HOSPITAL_COMMUNITY)
Admission: RE | Admit: 2020-08-10 | Discharge: 2020-08-10 | Disposition: A | Discharge status: Home / Self Care | Payer: Medicaid Other | Source: Ambulatory Visit | Attending: Nurse Practitioner | Admitting: Nurse Practitioner

## 2020-08-10 DIAGNOSIS — K7290 Hepatic failure, unspecified without coma: Secondary | ICD-10-CM | POA: Diagnosis present

## 2020-08-10 DIAGNOSIS — R609 Edema, unspecified: Secondary | ICD-10-CM | POA: Insufficient documentation

## 2020-08-10 DIAGNOSIS — R1084 Generalized abdominal pain: Secondary | ICD-10-CM

## 2020-08-10 DIAGNOSIS — K5900 Constipation, unspecified: Secondary | ICD-10-CM | POA: Insufficient documentation

## 2020-08-10 DIAGNOSIS — R1319 Other dysphagia: Secondary | ICD-10-CM | POA: Diagnosis present

## 2020-08-10 DIAGNOSIS — I81 Portal vein thrombosis: Secondary | ICD-10-CM | POA: Diagnosis present

## 2020-08-10 DIAGNOSIS — D696 Thrombocytopenia, unspecified: Secondary | ICD-10-CM | POA: Diagnosis present

## 2020-08-10 DIAGNOSIS — K766 Portal hypertension: Secondary | ICD-10-CM | POA: Insufficient documentation

## 2020-08-10 DIAGNOSIS — K7460 Unspecified cirrhosis of liver: Secondary | ICD-10-CM | POA: Insufficient documentation

## 2020-08-10 DIAGNOSIS — K7682 Hepatic encephalopathy: Secondary | ICD-10-CM

## 2020-08-10 DIAGNOSIS — R188 Other ascites: Secondary | ICD-10-CM | POA: Insufficient documentation

## 2020-08-16 NOTE — Telephone Encounter (Signed)
CT approved. Ref# [id number], valid 08/23/20-10/22/20.

## 2020-08-18 ENCOUNTER — Encounter (HOSPITAL_COMMUNITY): Payer: Self-pay

## 2020-08-18 ENCOUNTER — Ambulatory Visit (HOSPITAL_COMMUNITY)
Admission: RE | Admit: 2020-08-18 | Discharge: 2020-08-18 | Disposition: A | Discharge status: Home / Self Care | Payer: Medicaid Other | Source: Ambulatory Visit | Attending: Nurse Practitioner | Admitting: Nurse Practitioner

## 2020-08-18 DIAGNOSIS — R188 Other ascites: Secondary | ICD-10-CM | POA: Diagnosis present

## 2020-08-18 DIAGNOSIS — K7460 Unspecified cirrhosis of liver: Secondary | ICD-10-CM | POA: Diagnosis not present

## 2020-08-18 LAB — BODY FLUID CELL COUNT WITH DIFFERENTIAL
Eos, Fluid: 0 %
Lymphs, Fluid: 30 %
Monocyte-Macrophage-Serous Fluid: 60 % (ref 50–90)
Neutrophil Count, Fluid: 10 % (ref 0–25)
Total Nucleated Cell Count, Fluid: 505 cu mm (ref 0–1000)

## 2020-08-18 LAB — GRAM STAIN: Gram Stain: NONE SEEN

## 2020-08-18 MED ORDER — ALBUMIN HUMAN 25 % IV SOLN
0.0000 g | Freq: Once | INTRAVENOUS | Status: AC
  Filled 2020-08-18: qty 400

## 2020-08-18 MED ORDER — ALBUMIN HUMAN 25 % IV SOLN
INTRAVENOUS | Status: AC
  Administered 2020-08-18: 50 g via INTRAVENOUS
  Filled 2020-08-18: qty 200

## 2020-08-18 NOTE — Discharge Instructions (Signed)
Paracentesis, Care After This sheet gives you information about how to care for yourself after your procedure. Your health care provider may also give you more specific instructions. If you have problems or questions, contact your health care provider. What can I expect after the procedure? After the procedure, it is common to have a small amount of clear fluid coming from the puncture site. Follow these instructions at home: Puncture site care  Follow instructions from your health care provider about how to take care of your puncture site. Make sure you: ? Wash your hands with soap and water before and after you change your bandage (dressing). If soap and water are not available, use hand sanitizer. ? Change your dressing as told by your health care provider.  Check your puncture area every day for signs of infection. Check for: ? Redness, swelling, or pain. ? More fluid or blood. ? Warmth. ? Pus or a bad smell.   General instructions  Return to your normal activities as told by your health care provider. Ask your health care provider what activities are safe for you.  Take over-the-counter and prescription medicines only as told by your health care provider.  Do not take baths, swim, or use a hot tub until your health care provider approves. Ask your health care provider if you may take showers. You may only be allowed to take sponge baths.  Keep all follow-up visits as told by your health care provider. This is important. Contact a health care provider if:  You have redness, swelling, or pain at your puncture site.  You have more fluid or blood coming from your puncture site.  Your puncture site feels warm to the touch.  You have pus or a bad smell coming from your puncture site.  You have a fever. Get help right away if:  You have chest pain or shortness of breath.  You develop increasing pain, discomfort, or swelling in your abdomen.  You feel dizzy or light-headed or  you faint. Summary  After the procedure, it is common to have a small amount of clear fluid coming from the puncture site.  Follow instructions from your health care provider about how to take care of your puncture site.  Check your puncture area every day signs of infection.  Keep all follow-up visits as told by your health care provider. This information is not intended to replace advice given to you by your health care provider. Make sure you discuss any questions you have with your health care provider. Document Revised: 12/03/2018 Document Reviewed: 03/12/2018 Elsevier Patient Education  2021 [employer].

## 2020-08-18 NOTE — Procedures (Signed)
PreOperative Dx: Cirrhosis, ascites Postoperative Dx: Cirrhosis, ascites Procedure:   US guided paracentesis Radiologist:  Thornton Papas Anesthesia:  10 ml of1% lidocaine Specimen:  6.7 L of yellow ascitic fluid EBL:   < 1 ml Complications: None

## 2020-08-18 NOTE — Sedation Documentation (Signed)
PT tolerated right sided paracentesis procedure and IV albumin well today and 6.7 Liters of clear amber fluid removed with labs collected and sent for processing. Vital signs stable and pt verbalized understanding of discharge instructions and ambulatory at discharge.

## 2020-08-19 LAB — PATHOLOGIST SMEAR REVIEW

## 2020-08-19 NOTE — Telephone Encounter (Signed)
Heather at pre-service center said facility was incorrect on PA for CT.   Called [facility], spoke to nurse Manuela Schwartz, facility updated. Received updated auth via fax. Updated auth emailed to [employer].

## 2020-08-23 ENCOUNTER — Other Ambulatory Visit: Payer: Self-pay

## 2020-08-23 ENCOUNTER — Encounter (HOSPITAL_COMMUNITY): Payer: Self-pay

## 2020-08-23 ENCOUNTER — Ambulatory Visit (HOSPITAL_COMMUNITY)
Admission: RE | Admit: 2020-08-23 | Discharge: 2020-08-23 | Disposition: A | Discharge status: Home / Self Care | Payer: Medicaid Other | Source: Ambulatory Visit | Attending: Nurse Practitioner | Admitting: Nurse Practitioner

## 2020-08-23 DIAGNOSIS — K7460 Unspecified cirrhosis of liver: Secondary | ICD-10-CM | POA: Diagnosis present

## 2020-08-23 DIAGNOSIS — K5900 Constipation, unspecified: Secondary | ICD-10-CM | POA: Diagnosis present

## 2020-08-23 DIAGNOSIS — R188 Other ascites: Secondary | ICD-10-CM

## 2020-08-23 DIAGNOSIS — R1319 Other dysphagia: Secondary | ICD-10-CM | POA: Insufficient documentation

## 2020-08-23 DIAGNOSIS — D696 Thrombocytopenia, unspecified: Secondary | ICD-10-CM | POA: Insufficient documentation

## 2020-08-23 DIAGNOSIS — K766 Portal hypertension: Secondary | ICD-10-CM

## 2020-08-23 DIAGNOSIS — R609 Edema, unspecified: Secondary | ICD-10-CM | POA: Insufficient documentation

## 2020-08-23 DIAGNOSIS — K7290 Hepatic failure, unspecified without coma: Secondary | ICD-10-CM | POA: Insufficient documentation

## 2020-08-23 DIAGNOSIS — I81 Portal vein thrombosis: Secondary | ICD-10-CM | POA: Diagnosis not present

## 2020-08-23 DIAGNOSIS — K7682 Hepatic encephalopathy: Secondary | ICD-10-CM

## 2020-08-23 DIAGNOSIS — R1084 Generalized abdominal pain: Secondary | ICD-10-CM | POA: Insufficient documentation

## 2020-08-23 LAB — CULTURE, BODY FLUID W GRAM STAIN -BOTTLE: Culture: NO GROWTH

## 2020-08-23 MED ORDER — IOHEXOL 300 MG/ML  SOLN
100.0000 mL | Freq: Once | INTRAMUSCULAR | Status: AC | PRN
  Administered 2020-08-23: 100 mL via INTRAVENOUS

## 2020-08-23 MED ORDER — IOHEXOL 9 MG/ML PO SOLN
ORAL | Status: AC
  Filled 2020-08-23: qty 1000

## 2020-08-25 ENCOUNTER — Telehealth: Payer: Self-pay | Admitting: Internal Medicine

## 2020-08-25 ENCOUNTER — Other Ambulatory Visit: Payer: Self-pay | Admitting: Nurse Practitioner

## 2020-08-25 DIAGNOSIS — K766 Portal hypertension: Secondary | ICD-10-CM

## 2020-08-25 DIAGNOSIS — K7031 Alcoholic cirrhosis of liver with ascites: Secondary | ICD-10-CM

## 2020-08-25 DIAGNOSIS — I81 Portal vein thrombosis: Secondary | ICD-10-CM

## 2020-08-25 DIAGNOSIS — K5900 Constipation, unspecified: Secondary | ICD-10-CM

## 2020-08-25 NOTE — Telephone Encounter (Signed)
Pt is almost out of Xifaxan and wants to know if he needs to continue the medication. Please advise.

## 2020-08-25 NOTE — Telephone Encounter (Signed)
Pt has questions about a medication. [phone]

## 2020-08-26 ENCOUNTER — Other Ambulatory Visit: Payer: Self-pay

## 2020-08-26 DIAGNOSIS — K766 Portal hypertension: Secondary | ICD-10-CM

## 2020-08-26 DIAGNOSIS — K5900 Constipation, unspecified: Secondary | ICD-10-CM

## 2020-08-26 DIAGNOSIS — K7031 Alcoholic cirrhosis of liver with ascites: Secondary | ICD-10-CM

## 2020-08-26 DIAGNOSIS — I81 Portal vein thrombosis: Secondary | ICD-10-CM

## 2020-08-26 MED ORDER — RIFAXIMIN 550 MG PO TABS
550.0000 mg | ORAL_TABLET | Freq: Two times a day (BID) | ORAL | 3 refills | Status: DC
Start: 2020-08-26 — End: 2020-08-26

## 2020-08-26 MED ORDER — RIFAXIMIN 550 MG PO TABS: 550.0000 mg | ORAL_TABLET | Freq: Two times a day (BID) | ORAL | 3 refills | Status: DC

## 2020-08-26 NOTE — Telephone Encounter (Signed)
Absolutely. I will send in a refill now so it's available for him.  Let me know if any other questions or problems.

## 2020-08-26 NOTE — Telephone Encounter (Signed)
Pt is aware that RX was sent to his pharmacy.

## 2020-08-26 NOTE — Addendum Note (Signed)
Addended by: Gordy Levan, ERIC A on: 08/26/2020 11:08 AM   Modules accepted: Orders

## 2020-08-30 ENCOUNTER — Telehealth: Payer: Self-pay | Admitting: Internal Medicine

## 2020-08-30 ENCOUNTER — Other Ambulatory Visit: Payer: Self-pay | Admitting: *Deleted

## 2020-08-30 DIAGNOSIS — R188 Other ascites: Secondary | ICD-10-CM

## 2020-08-30 NOTE — Telephone Encounter (Signed)
Do you know if pt still has standing orders for paras?

## 2020-08-30 NOTE — Telephone Encounter (Signed)
Pt called to let us know that he called radiology and was told he had no more orders to have fluid drawn. Please advise.

## 2020-08-30 NOTE — Telephone Encounter (Signed)
More orders entered. Called pt and made aware.

## 2020-09-09 ENCOUNTER — Encounter (HOSPITAL_COMMUNITY): Payer: Self-pay

## 2020-09-09 ENCOUNTER — Ambulatory Visit (HOSPITAL_COMMUNITY)
Admission: RE | Admit: 2020-09-09 | Discharge: 2020-09-09 | Disposition: A | Discharge status: Home / Self Care | Payer: Medicaid Other | Source: Ambulatory Visit | Attending: Nurse Practitioner | Admitting: Nurse Practitioner

## 2020-09-09 DIAGNOSIS — R188 Other ascites: Secondary | ICD-10-CM | POA: Diagnosis not present

## 2020-09-09 LAB — GRAM STAIN: Gram Stain: NONE SEEN

## 2020-09-09 LAB — BODY FLUID CELL COUNT WITH DIFFERENTIAL
Eos, Fluid: 6 %
Lymphs, Fluid: 31 %
Monocyte-Macrophage-Serous Fluid: 6 % — ABNORMAL LOW (ref 50–90)
Neutrophil Count, Fluid: 57 % — ABNORMAL HIGH (ref 0–25)
Total Nucleated Cell Count, Fluid: 435 cu mm (ref 0–1000)

## 2020-09-09 MED ORDER — ALBUMIN HUMAN 25 % IV SOLN
INTRAVENOUS | Status: AC
  Administered 2020-09-09: 50 g via INTRAVENOUS
  Filled 2020-09-09: qty 200

## 2020-09-09 MED ORDER — ALBUMIN HUMAN 25 % IV SOLN: 0.0000 g | Freq: Once | INTRAVENOUS | Status: AC

## 2020-09-09 NOTE — Procedures (Signed)
PreOperative Dx: Cirrhosis, ascites Postoperative Dx: Cirrhosis, ascites Procedure:   US guided paracentesis Radiologist:  Thornton Papas Anesthesia:  10 ml of1% lidocaine Specimen:  5 L of yellow ascitic fluid EBL:   < 1 ml Complications: None

## 2020-09-09 NOTE — Discharge Instructions (Signed)
Paracentesis, Care After This sheet gives you information about how to care for yourself after your procedure. Your health care provider may also give you more specific instructions. If you have problems or questions, contact your health care provider. What can I expect after the procedure? After the procedure, it is common to have a small amount of clear fluid coming from the puncture site. Follow these instructions at home: Puncture site care  Follow instructions from your health care provider about how to take care of your puncture site. Make sure you: ? Wash your hands with soap and water before and after you change your bandage (dressing). If soap and water are not available, use hand sanitizer. ? Change your dressing as told by your health care provider.  Check your puncture area every day for signs of infection. Check for: ? Redness, swelling, or pain. ? More fluid or blood. ? Warmth. ? Pus or a bad smell.   General instructions  Return to your normal activities as told by your health care provider. Ask your health care provider what activities are safe for you.  Take over-the-counter and prescription medicines only as told by your health care provider.  Do not take baths, swim, or use a hot tub until your health care provider approves. Ask your health care provider if you may take showers. You may only be allowed to take sponge baths.  Keep all follow-up visits as told by your health care provider. This is important. Contact a health care provider if:  You have redness, swelling, or pain at your puncture site.  You have more fluid or blood coming from your puncture site.  Your puncture site feels warm to the touch.  You have pus or a bad smell coming from your puncture site.  You have a fever. Get help right away if:  You have chest pain or shortness of breath.  You develop increasing pain, discomfort, or swelling in your abdomen.  You feel dizzy or light-headed or  you faint. Summary  After the procedure, it is common to have a small amount of clear fluid coming from the puncture site.  Follow instructions from your health care provider about how to take care of your puncture site.  Check your puncture area every day signs of infection.  Keep all follow-up visits as told by your health care provider. This information is not intended to replace advice given to you by your health care provider. Make sure you discuss any questions you have with your health care provider. Document Revised: 12/03/2018 Document Reviewed: 03/12/2018 Elsevier Patient Education  2021 [employer].

## 2020-09-09 NOTE — Sedation Documentation (Signed)
Pt tolerated right sided paracentesis procedure well today with 5 Liters of clear amber fluid removed with labs collected and sent for processing. PT ambulatory at discharge with NAD noted. PT was given 50G IV albumin during procedure and tolerated well.

## 2020-09-10 LAB — PATHOLOGIST SMEAR REVIEW

## 2020-09-14 ENCOUNTER — Other Ambulatory Visit: Payer: Self-pay

## 2020-09-14 ENCOUNTER — Encounter (HOSPITAL_COMMUNITY): Payer: Self-pay | Admitting: Emergency Medicine

## 2020-09-14 ENCOUNTER — Inpatient Hospital Stay (HOSPITAL_COMMUNITY)
Admission: EM | Admit: 2020-09-14 | Discharge: 2020-09-16 | DRG: 371 | Disposition: A | Discharge status: Home / Self Care | Payer: Medicaid Other | Attending: Family Medicine | Admitting: Family Medicine

## 2020-09-14 DIAGNOSIS — Z20822 Contact with and (suspected) exposure to covid-19: Secondary | ICD-10-CM | POA: Diagnosis present

## 2020-09-14 DIAGNOSIS — I129 Hypertensive chronic kidney disease with stage 1 through stage 4 chronic kidney disease, or unspecified chronic kidney disease: Secondary | ICD-10-CM | POA: Diagnosis present

## 2020-09-14 DIAGNOSIS — K7460 Unspecified cirrhosis of liver: Secondary | ICD-10-CM | POA: Diagnosis present

## 2020-09-14 DIAGNOSIS — B957 Other staphylococcus as the cause of diseases classified elsewhere: Secondary | ICD-10-CM | POA: Diagnosis present

## 2020-09-14 DIAGNOSIS — K652 Spontaneous bacterial peritonitis: Secondary | ICD-10-CM | POA: Diagnosis not present

## 2020-09-14 DIAGNOSIS — R109 Unspecified abdominal pain: Secondary | ICD-10-CM | POA: Diagnosis present

## 2020-09-14 DIAGNOSIS — Z7901 Long term (current) use of anticoagulants: Secondary | ICD-10-CM

## 2020-09-14 DIAGNOSIS — E871 Hypo-osmolality and hyponatremia: Secondary | ICD-10-CM | POA: Diagnosis present

## 2020-09-14 DIAGNOSIS — K7031 Alcoholic cirrhosis of liver with ascites: Secondary | ICD-10-CM

## 2020-09-14 DIAGNOSIS — I81 Portal vein thrombosis: Secondary | ICD-10-CM | POA: Diagnosis present

## 2020-09-14 DIAGNOSIS — G8929 Other chronic pain: Secondary | ICD-10-CM | POA: Diagnosis present

## 2020-09-14 DIAGNOSIS — Z888 Allergy status to other drugs, medicaments and biological substances status: Secondary | ICD-10-CM

## 2020-09-14 DIAGNOSIS — K7290 Hepatic failure, unspecified without coma: Secondary | ICD-10-CM | POA: Diagnosis present

## 2020-09-14 DIAGNOSIS — R188 Other ascites: Secondary | ICD-10-CM | POA: Diagnosis present

## 2020-09-14 DIAGNOSIS — K766 Portal hypertension: Secondary | ICD-10-CM | POA: Diagnosis present

## 2020-09-14 DIAGNOSIS — G629 Polyneuropathy, unspecified: Secondary | ICD-10-CM | POA: Diagnosis present

## 2020-09-14 DIAGNOSIS — K7581 Nonalcoholic steatohepatitis (NASH): Secondary | ICD-10-CM | POA: Diagnosis present

## 2020-09-14 DIAGNOSIS — I8510 Secondary esophageal varices without bleeding: Secondary | ICD-10-CM | POA: Diagnosis present

## 2020-09-14 DIAGNOSIS — K5900 Constipation, unspecified: Secondary | ICD-10-CM

## 2020-09-14 DIAGNOSIS — K219 Gastro-esophageal reflux disease without esophagitis: Secondary | ICD-10-CM

## 2020-09-14 DIAGNOSIS — N182 Chronic kidney disease, stage 2 (mild): Secondary | ICD-10-CM | POA: Diagnosis present

## 2020-09-14 DIAGNOSIS — D6959 Other secondary thrombocytopenia: Secondary | ICD-10-CM | POA: Diagnosis present

## 2020-09-14 DIAGNOSIS — Z79899 Other long term (current) drug therapy: Secondary | ICD-10-CM

## 2020-09-14 DIAGNOSIS — Z28310 Unvaccinated for covid-19: Secondary | ICD-10-CM

## 2020-09-14 DIAGNOSIS — R251 Tremor, unspecified: Secondary | ICD-10-CM | POA: Diagnosis present

## 2020-09-14 DIAGNOSIS — D649 Anemia, unspecified: Secondary | ICD-10-CM | POA: Diagnosis present

## 2020-09-14 DIAGNOSIS — K429 Umbilical hernia without obstruction or gangrene: Secondary | ICD-10-CM | POA: Diagnosis present

## 2020-09-14 LAB — PROTIME-INR
INR: 1.2 (ref 0.8–1.2)
Prothrombin Time: 15.5 seconds — ABNORMAL HIGH (ref 11.4–15.2)

## 2020-09-14 LAB — CULTURE, BODY FLUID W GRAM STAIN -BOTTLE

## 2020-09-14 LAB — COMPREHENSIVE METABOLIC PANEL
ALT: 16 U/L (ref 0–44)
AST: 21 U/L (ref 15–41)
Albumin: 3.5 g/dL (ref 3.5–5.0)
Alkaline Phosphatase: 80 U/L (ref 38–126)
Anion gap: 7 (ref 5–15)
BUN: 31 mg/dL — ABNORMAL HIGH (ref 6–20)
CO2: 26 mmol/L (ref 22–32)
Calcium: 8.6 mg/dL — ABNORMAL LOW (ref 8.9–10.3)
Chloride: 95 mmol/L — ABNORMAL LOW (ref 98–111)
Creatinine, Ser: 1.38 mg/dL — ABNORMAL HIGH (ref 0.61–1.24)
GFR, Estimated: >60 mL/min (ref >60)
Glucose, Bld: 100 mg/dL — ABNORMAL HIGH (ref 70–99)
Potassium: 4.5 mmol/L (ref 3.5–5.1)
Sodium: 128 mmol/L — ABNORMAL LOW (ref 135–145)
Total Bilirubin: 0.6 mg/dL (ref 0.3–1.2)
Total Protein: 7.3 g/dL (ref 6.5–8.1)

## 2020-09-14 LAB — CBC WITH DIFFERENTIAL/PLATELET
Abs Immature Granulocytes: 0.01 10*3/uL (ref 0.00–0.07)
Basophils Absolute: 0 10*3/uL (ref 0.0–0.1)
Basophils Relative: 1 %
Eosinophils Absolute: 0.2 10*3/uL (ref 0.0–0.5)
Eosinophils Relative: 3 %
HCT: 38.2 % — ABNORMAL LOW (ref 39.0–52.0)
Hemoglobin: 13 g/dL (ref 13.0–17.0)
Immature Granulocytes: 0 %
Lymphocytes Relative: 8 %
Lymphs Abs: 0.5 10*3/uL — ABNORMAL LOW (ref 0.7–4.0)
MCH: 31.2 pg (ref 26.0–34.0)
MCHC: 34 g/dL (ref 30.0–36.0)
MCV: 91.6 fL (ref 80.0–100.0)
Monocytes Absolute: 0.7 10*3/uL (ref 0.1–1.0)
Monocytes Relative: 11 %
Neutro Abs: 4.7 10*3/uL (ref 1.7–7.7)
Neutrophils Relative %: 77 %
Platelets: 165 10*3/uL (ref 150–400)
RBC: 4.17 MIL/uL — ABNORMAL LOW (ref 4.22–5.81)
RDW: 15.3 % (ref 11.5–15.5)
WBC: 6 10*3/uL (ref 4.0–10.5)
nRBC: 0 % (ref 0.0–0.2)

## 2020-09-14 LAB — RESP PANEL BY RT-PCR (FLU A&B, COVID) ARPGX2
Influenza A by PCR: NEGATIVE
Influenza B by PCR: NEGATIVE
SARS Coronavirus 2 by RT PCR: NEGATIVE

## 2020-09-14 LAB — LACTIC ACID, PLASMA: Lactic Acid, Venous: 0.7 mmol/L (ref 0.5–1.9)

## 2020-09-14 MED ORDER — SPIRONOLACTONE 25 MG PO TABS
50.0000 mg | ORAL_TABLET | Freq: Two times a day (BID) | ORAL | Status: DC
  Administered 2020-09-15 – 2020-09-16 (×3): 50 mg via ORAL
  Filled 2020-09-14 (×3): qty 2

## 2020-09-14 MED ORDER — TRAMADOL HCL 50 MG PO TABS
50.0000 mg | ORAL_TABLET | Freq: Three times a day (TID) | ORAL | Status: DC | PRN
  Administered 2020-09-14 – 2020-09-16 (×2): 50 mg via ORAL
  Filled 2020-09-14 (×2): qty 1

## 2020-09-14 MED ORDER — APIXABAN 5 MG PO TABS
5.0000 mg | ORAL_TABLET | Freq: Two times a day (BID) | ORAL | Status: DC
  Administered 2020-09-14 – 2020-09-16 (×4): 5 mg via ORAL
  Filled 2020-09-14 (×4): qty 1

## 2020-09-14 MED ORDER — PANTOPRAZOLE SODIUM 40 MG PO TBEC
40.0000 mg | DELAYED_RELEASE_TABLET | Freq: Every day | ORAL | Status: DC
  Administered 2020-09-14 – 2020-09-16 (×3): 40 mg via ORAL
  Filled 2020-09-14 (×3): qty 1

## 2020-09-14 MED ORDER — ONDANSETRON HCL 4 MG/2ML IJ SOLN: 4.0000 mg | Freq: Four times a day (QID) | INTRAMUSCULAR | Status: DC | PRN

## 2020-09-14 MED ORDER — FUROSEMIDE 20 MG PO TABS
20.0000 mg | ORAL_TABLET | Freq: Two times a day (BID) | ORAL | Status: DC
  Administered 2020-09-15 – 2020-09-16 (×3): 20 mg via ORAL
  Filled 2020-09-14 (×3): qty 1

## 2020-09-14 MED ORDER — ALBUMIN HUMAN 25 % IV SOLN
25.0000 g | Freq: Four times a day (QID) | INTRAVENOUS | Status: AC
  Administered 2020-09-14 – 2020-09-15 (×3): 25 g via INTRAVENOUS
  Filled 2020-09-14 (×7): qty 100

## 2020-09-14 MED ORDER — LEVOCARNITINE 1 GM/10ML PO SOLN
330.0000 mg | Freq: Three times a day (TID) | ORAL | Status: DC
  Administered 2020-09-15 – 2020-09-16 (×4): 330 mg via ORAL
  Filled 2020-09-14 (×8): qty 3.3

## 2020-09-14 MED ORDER — RIFAXIMIN 550 MG PO TABS
550.0000 mg | ORAL_TABLET | Freq: Two times a day (BID) | ORAL | Status: DC
Start: 2020-09-14 — End: 2020-09-16
  Administered 2020-09-14 – 2020-09-16 (×4): 550 mg via ORAL
  Filled 2020-09-14 (×4): qty 1

## 2020-09-14 MED ORDER — ONDANSETRON HCL 4 MG PO TABS: 4.0000 mg | ORAL_TABLET | Freq: Four times a day (QID) | ORAL | Status: DC | PRN

## 2020-09-14 MED ORDER — PROPRANOLOL HCL 20 MG PO TABS
20.0000 mg | ORAL_TABLET | Freq: Two times a day (BID) | ORAL | Status: DC
  Administered 2020-09-14 – 2020-09-15 (×2): 20 mg via ORAL
  Filled 2020-09-14 (×2): qty 1

## 2020-09-14 MED ORDER — SODIUM CHLORIDE 0.9 % IV SOLN: 1.0000 g | Freq: Three times a day (TID) | INTRAVENOUS | Status: DC

## 2020-09-14 MED ORDER — LACTULOSE 10 GM/15ML PO SOLN
20.0000 g | Freq: Two times a day (BID) | ORAL | Status: DC
Start: 2020-09-14 — End: 2020-09-16
  Administered 2020-09-14 – 2020-09-16 (×4): 20 g via ORAL
  Filled 2020-09-14 (×4): qty 30

## 2020-09-14 MED ORDER — SODIUM CHLORIDE 0.9 % IV SOLN
2.0000 g | INTRAVENOUS | Status: DC
  Administered 2020-09-14: 2 g via INTRAVENOUS
  Filled 2020-09-14: qty 20

## 2020-09-14 NOTE — ED Triage Notes (Signed)
Pt sent to ED for tx of + staph infection of paracentesis fluid x 4 days ago.  Pt needs albumin, abx and repeat paracentesis.

## 2020-09-14 NOTE — H&P (Signed)
TRH H&P    Patient Demographics:    Alejandro Porter, is a 45 y.o. male  MRN: [id number]  DOB - September 16, 1975  Admit Date - 09/14/2020  Referring MD/NP/PA: Langston Masker  Outpatient Primary MD for the patient is Sofie Rower, PA-C  Patient coming from: Home  Chief complaint- "infection in belly"   HPI:    Alejandro Porter  is a 45 y.o. male, with history of non alcoholic liver cirrhosis, HTN, neuropathy and more presents to the ED with a chief complaint of infection in his belly.  Patient reports that he had paracentesis on April 7, and was called today and told to come into the ER.  He reports that he has belly pain. It is like his normal belly pain, but not as severe.  He reports the pain is diffuse throughout his abdomen, and feels like a pressure.  He reports that he also has an inguinal hernia, so when his ascites builds up, the pain is also worsened his hernia.  He takes gabapentin as needed and reports that it helps with the pain.  The most improvement he gets in the pain is when he has a paracentesis.  He believes his dry weight to be 219 pounds, but reports that his weight today was 227 pounds.  He reports he usually gains 15 to 20 pounds between paracenteses.  He gets his belly tapped every 2 weeks.  He reports he has had no fevers, no nausea or vomiting, no loss of appetite.  Last normal meal was this morning, his last normal bowel movement was this morning.  He rates his pain today as a 2 out of 10, with a 10 out of 10 being the day he gets his paracentesis.  He reports that he sometimes has chest pain but it is also from the pressure in his abdomen when his ascites builds up.  He has an associated cough when his ascites builds up as well.  None of the things are happening right now.  On chart review it was reported that patient's kidney function had bumped to nearly 2.0 from his baseline of 1.3.  RAD creatinine is  1.38.  Provider was concerned that with his cirrhosis, SBP, elevated kidney function he would be at an increased risk for poor outcome, so was advised to go to the ED for IV antibiotics, and IV albumin treatment.  I spoke to GI who recommended 2 g Rocephin daily, and albumin 25 g IV every 6 hours x4 today, and then again on day 3.  Reports that they will see him in the a.m.  Paracentesis done on April 7 yielded 5 L of fluid, that was hazy, straw-colored, with 57 neutrophils.  Grew staph auricularis, sensitive to all antibiotics except for oxacillin.  Patient does not smoke, does not drink alcohol, does not use illicit drugs.  He was not vaccinated for COVID.  Patient is full code.  In the ED Afebrile, heart rate 62, respiratory rate 18, blood pressure 107/70, satting at 99% No leukocytosis with a white blood cell count of  6.0, hemoglobin 13.0 Hyponatremia with a sodium of 128, creatinine near to baseline at 1.38, glucose 100 Respiratory panel pending Blood culture pending     Review of systems:    In addition to the HPI above,  No Fever-chills, No Headache, No changes with Vision or hearing, No problems swallowing food or Liquids, No Chest pain, Cough or Shortness of Breath, No worsening abdominal pain, No Nausea or Vomiting, bowel movements are regular, No Blood in stool or Urine, No dysuria, No new skin rashes or bruises, No new joints pains-aches,  No new weakness, tingling, numbness in any extremity, No recent weight gain or loss, No polyuria, polydypsia or polyphagia, No significant Mental Stressors.  All other systems reviewed and are negative.    Past History of the following :    Past Medical History:  Diagnosis Date  . H/O colonoscopy   . H/O endoscopy   . Hypertension   . Liver cirrhosis ([place])   . Neuropathy   . S/P abdominal paracentesis       Past Surgical History:  Procedure Laterality Date  . BIOPSY  09/12/2019   Procedure: BIOPSY;  Surgeon: Daneil Dolin, MD;  [facility];  Service: Endoscopy;;  . COLONOSCOPY N/A 09/18/2019   Procedure: COLONOSCOPY;  Surgeon: Danie Binder, MD;  [facility];  Service: Endoscopy;  Laterality: N/A;  . ESOPHAGEAL BANDING N/A 09/12/2019   Procedure: ESOPHAGEAL BANDING;  Surgeon: Daneil Dolin, MD;  [facility];  Service: Endoscopy;  Laterality: N/A;  . ESOPHAGOGASTRODUODENOSCOPY N/A 09/12/2019   Procedure: ESOPHAGOGASTRODUODENOSCOPY (EGD);  Surgeon: Daneil Dolin, MD;  [facility];  Service: Endoscopy;  Laterality: N/A;  . POLYPECTOMY  09/18/2019   Procedure: POLYPECTOMY;  Surgeon: Danie Binder, MD;  [facility];  Service: Endoscopy;;      Social History:      Social History   Tobacco Use  . Smoking status: Never Smoker  . Smokeless tobacco: Never Used  Substance Use Topics  . Alcohol use: Never       Family History :     Family History  Problem Relation Age of Onset  . Brain cancer Mother   . Diabetes Mother   . Diabetes Father   . Pulmonary embolism Brother   . Liver disease Neg Hx       Home Medications:   Prior to Admission medications   Medication Sig Start Date End Date Taking? Authorizing Provider  apixaban (ELIQUIS) 5 MG TABS tablet Take 1 tablet (5 mg total) by mouth 2 (two) times daily. 09/22/19  Yes Emokpae, Courage, MD  Cholecalciferol (VITAMIN D3) 125 MCG (5000 UT) TABS Take 1 tablet by mouth daily. 06/17/19  Yes [provider]  Ferrous Sulfate (IRON) 325 (65 Fe) MG TABS Take 1 tablet (325 mg total) by mouth daily. 09/22/19  Yes Emokpae, Courage, MD  furosemide (LASIX) 40 MG tablet Take 1 tablet (40 mg total) by mouth 2 (two) times daily. Patient taking differently: Take 20 mg by mouth 2 (two) times daily. 09/22/19  Yes Emokpae, Courage, MD  gabapentin (NEURONTIN) 300 MG capsule Take 1 capsule (300 mg total) by mouth at bedtime. 09/22/19  Yes Emokpae, Courage, MD  lactulose (CHRONULAC) 10 GM/15ML  solution Take 15 mLs (10 g total) by mouth daily. Patient taking differently: Take 20 g by mouth 2 (two) times daily. 02/04/20  Yes Carlis Stable, NP  levOCARNitine (CARNITOR) 330 MG tablet Take 1 tablet by  mouth 3 (three) times daily. 09/01/20  Yes [provider]  Multiple Vitamin (MULTIVITAMIN) tablet Take 1 tablet by mouth daily.   Yes [provider]  omeprazole (PRILOSEC) 20 MG capsule Take 1 capsule (20 mg total) by mouth daily. 09/22/19 09/21/20 Yes Emokpae, Courage, MD  propranolol (INDERAL) 20 MG tablet Take 1 tablet (20 mg total) by mouth 2 (two) times daily. 09/22/19  Yes Emokpae, Courage, MD  rifaximin (XIFAXAN) 550 MG TABS tablet Take 1 tablet (550 mg total) by mouth 2 (two) times daily. 08/26/20  Yes Carlis Stable, NP  spironolactone (ALDACTONE) 100 MG tablet Take 1 tablet (100 mg total) by mouth 2 (two) times daily with a meal. Patient taking differently: Take 50 mg by mouth 2 (two) times daily with a meal. 09/22/19  Yes Emokpae, Courage, MD  Zinc Chelated 50 MG TABS TAKE ONE TABLET BY MOUTH THREE TIMES DAILY 09/01/20  Yes [provider]  HYDROcodone-acetaminophen (NORCO/VICODIN) 5-325 MG tablet Take 1 tablet by mouth every 6 (six) hours as needed. Patient not taking: No sig reported 07/22/20   [provider]  XIFAXAN 550 MG TABS tablet TAKE 1 TABLET BY MOUTH TWICE A DAY Patient not taking: No sig reported 08/27/20   Carlis Stable, NP     Allergies:     Allergies  Allergen Reactions  . Chlorthalidone   . Metoprolol      Physical Exam:   Vitals  Blood pressure 100/71, pulse 60, temperature 98.2 F (36.8 C), temperature source Oral, resp. rate 18, SpO2 98 %.  1.  General: Patient lying supine in bed in no acute distress  2. Psychiatric: Flat affect, alert and oriented x3, cooperative with exam  3. Neurologic: Speech and language are normal, face is symmetric, moves all 4 extremities voluntarily, no acute deficit on limited exam  4.  HEENMT:  Head is atraumatic, normocephalic, pupils reactive to light, neck is supple, trachea is midline, mucous membranes are moist  5. Respiratory : Lungs are clear to auscultation bilaterally without wheezes, rhonchi, rales, no clubbing, no cyanosis  6. Cardiovascular : Heart rate is normal, rhythm is regular, no murmurs rubs or gallops  7. Gastrointestinal:  Abdomen is mildly distended, with fluid wave, diffusely tender without guarding, no rebound tenderness, no Murphy sign, soft, bowel sounds present  8. Skin:  Skin is warm dry and intact without acute lesion on exam  9.Musculoskeletal:  No calf tenderness, peripheral pulses palpated, no acute deformities    Data Review:    CBC Recent Labs  Lab 09/14/20 1611  WBC 6.0  HGB 13.0  HCT 38.2*  PLT 165  MCV 91.6  MCH 31.2  MCHC 34.0  RDW 15.3  LYMPHSABS 0.5*  MONOABS 0.7  EOSABS 0.2  BASOSABS 0.0   ------------------------------------------------------------------------------------------------------------------  Results for orders placed or performed during the hospital encounter of 09/14/20 (from the past 48 hour(s))  Resp Panel by RT-PCR (Flu A&B, Covid) Nasopharyngeal Swab     Status: None   Collection Time: 09/14/20  4:10 PM   Specimen: Nasopharyngeal Swab; Nasopharyngeal(NP) swabs in vial transport medium  Result Value Ref Range   SARS Coronavirus 2 by RT PCR NEGATIVE NEGATIVE    Comment: (NOTE) SARS-CoV-2 target nucleic acids are NOT DETECTED.  The SARS-CoV-2 RNA is generally detectable in upper respiratory specimens during the acute phase of infection. The lowest concentration of SARS-CoV-2 viral copies this assay can detect is 138 copies/mL. A negative result does not preclude SARS-Cov-2 infection and should not be  used as the sole basis for treatment or other patient management decisions. A negative result may occur with  improper specimen collection/handling, submission of specimen other than  nasopharyngeal swab, presence of viral mutation(s) within the areas targeted by this assay, and inadequate number of viral copies(<138 copies/mL). A negative result must be combined with clinical observations, patient history, and epidemiological information. The expected result is Negative.  Fact Sheet for Patients:  EntrepreneurPulse.com.au  Fact Sheet for Healthcare Providers:  IncredibleEmployment.be  This test is no t yet approved or cleared by the [place] FDA and  has been authorized for detection and/or diagnosis of SARS-CoV-2 by FDA under an Emergency Use Authorization (EUA). This EUA will remain  in effect (meaning this test can be used) for the duration of the COVID-19 declaration under Section 564(b)(1) of the Act, 21 U.S.C.section 360bbb-3(b)(1), unless the authorization is terminated  or revoked sooner.       Influenza A by PCR NEGATIVE NEGATIVE   Influenza B by PCR NEGATIVE NEGATIVE    Comment: (NOTE) The Xpert Xpress SARS-CoV-2/FLU/RSV plus assay is intended as an aid in the diagnosis of influenza from Nasopharyngeal swab specimens and should not be used as a sole basis for treatment. Nasal washings and aspirates are unacceptable for Xpert Xpress SARS-CoV-2/FLU/RSV testing.  Fact Sheet for Patients: EntrepreneurPulse.com.au  Fact Sheet for Healthcare Providers: IncredibleEmployment.be  This test is not yet approved or cleared by the [place] FDA and has been authorized for detection and/or diagnosis of SARS-CoV-2 by FDA under an Emergency Use Authorization (EUA). This EUA will remain in effect (meaning this test can be used) for the duration of the COVID-19 declaration under Section 564(b)(1) of the Act, 21 U.S.C. section 360bbb-3(b)(1), unless the authorization is terminated or revoked.  Performed at [facility], [address], [place], [place] [address]   Comprehensive  metabolic panel     Status: Abnormal   Collection Time: 09/14/20  4:11 PM  Result Value Ref Range   Sodium 128 (L) 135 - 145 mmol/L   Potassium 4.5 3.5 - 5.1 mmol/L   Chloride 95 (L) 98 - 111 mmol/L   CO2 26 22 - 32 mmol/L   Glucose, Bld 100 (H) 70 - 99 mg/dL    Comment: Glucose reference range applies only to samples taken after fasting for at least 8 hours.   BUN 31 (H) 6 - 20 mg/dL   Creatinine, Ser 1.38 (H) 0.61 - 1.24 mg/dL   Calcium 8.6 (L) 8.9 - 10.3 mg/dL   Total Protein 7.3 6.5 - 8.1 g/dL   Albumin 3.5 3.5 - 5.0 g/dL   AST 21 15 - 41 U/L   ALT 16 0 - 44 U/L   Alkaline Phosphatase 80 38 - 126 U/L   Total Bilirubin 0.6 0.3 - 1.2 mg/dL   GFR, Estimated >60 >60 mL/min    Comment: (NOTE) Calculated using the CKD-EPI Creatinine Equation (2021)    Anion gap 7 5 - 15    Comment: Performed at [facility], [address], [place], [place] [address]  CBC with Differential     Status: Abnormal   Collection Time: 09/14/20  4:11 PM  Result Value Ref Range   WBC 6.0 4.0 - 10.5 K/uL   RBC 4.17 (L) 4.22 - 5.81 MIL/uL   Hemoglobin 13.0 13.0 - 17.0 g/dL   HCT 38.2 (L) 39.0 - 52.0 %   MCV 91.6 80.0 - 100.0 fL   MCH 31.2 26.0 - 34.0 pg   MCHC  34.0 30.0 - 36.0 g/dL   RDW 15.3 11.5 - 15.5 %   Platelets 165 150 - 400 K/uL   nRBC 0.0 0.0 - 0.2 %   Neutrophils Relative % 77 %   Neutro Abs 4.7 1.7 - 7.7 K/uL   Lymphocytes Relative 8 %   Lymphs Abs 0.5 (L) 0.7 - 4.0 K/uL   Monocytes Relative 11 %   Monocytes Absolute 0.7 0.1 - 1.0 K/uL   Eosinophils Relative 3 %   Eosinophils Absolute 0.2 0.0 - 0.5 K/uL   Basophils Relative 1 %   Basophils Absolute 0.0 0.0 - 0.1 K/uL   Immature Granulocytes 0 %   Abs Immature Granulocytes 0.01 0.00 - 0.07 K/uL    Comment: Performed at [facility], [address] [place], [place] [address]  Blood culture (routine x 2)     Status: None (Preliminary result)   Collection Time: 09/14/20  4:11 PM   Specimen: BLOOD  Result Value Ref Range   Specimen  Description BLOOD LEFT ANTECUBITAL    Special Requests      BOTTLES DRAWN AEROBIC AND ANAEROBIC Blood Culture adequate volume Performed at [facility], [address], [place], [place] [address]    Culture PENDING    Report Status PENDING   Protime-INR     Status: Abnormal   Collection Time: 09/14/20  4:11 PM  Result Value Ref Range   Prothrombin Time 15.5 (H) 11.4 - 15.2 seconds   INR 1.2 0.8 - 1.2    Comment: (NOTE) INR goal varies based on device and disease states. Performed at [facility], [address] [place], [place] [address]   Lactic acid, plasma     Status: None   Collection Time: 09/14/20  4:15 PM  Result Value Ref Range   Lactic Acid, Venous 0.7 0.5 - 1.9 mmol/L    Comment: Performed at [facility], [address] [place], [place] [address]    Chemistries  Recent Labs  Lab 09/14/20 1611  NA 128*  K 4.5  CL 95*  CO2 26  GLUCOSE 100*  BUN 31*  CREATININE 1.38*  CALCIUM 8.6*  AST 21  ALT 16  ALKPHOS 80  BILITOT 0.6   ------------------------------------------------------------------------------------------------------------------  ------------------------------------------------------------------------------------------------------------------ GFR: CrCl cannot be calculated (Unknown ideal weight.). Liver Function Tests: Recent Labs  Lab 09/14/20 1611  AST 21  ALT 16  ALKPHOS 80  BILITOT 0.6  PROT 7.3  ALBUMIN 3.5   No results for input(s): LIPASE, AMYLASE in the last 168 hours. No results for input(s): AMMONIA in the last 168 hours. Coagulation Profile: Recent Labs  Lab 09/14/20 1611  INR 1.2   Cardiac Enzymes: No results for input(s): CKTOTAL, CKMB, CKMBINDEX, TROPONINI in the last 168 hours. BNP (last 3 results) No results for input(s): PROBNP in the last 8760 hours. HbA1C: No results for input(s): HGBA1C in the last 72 hours. CBG: No results for input(s): GLUCAP in the last 168 hours. Lipid Profile: No results for input(s):  CHOL, HDL, LDLCALC, TRIG, CHOLHDL, LDLDIRECT in the last 72 hours. Thyroid Function Tests: No results for input(s): TSH, T4TOTAL, FREET4, T3FREE, THYROIDAB in the last 72 hours. Anemia Panel: No results for input(s): VITAMINB12, FOLATE, FERRITIN, TIBC, IRON, RETICCTPCT in the last 72 hours.  --------------------------------------------------------------------------------------------------------------- Urine analysis:    Component Value Date/Time   COLORURINE YELLOW 09/10/2019 2100   APPEARANCEUR CLEAR 09/10/2019 2100   LABSPEC 1.012 09/10/2019 2100   PHURINE 9.0 (H) 09/10/2019 2100   GLUCOSEU NEGATIVE 09/10/2019 2100   HGBUR  NEGATIVE 09/10/2019 2100   [place] NEGATIVE 09/10/2019 2100   [place] NEGATIVE 09/10/2019 2100   PROTEINUR NEGATIVE 09/10/2019 2100   NITRITE NEGATIVE 09/10/2019 2100   LEUKOCYTESUR NEGATIVE 09/10/2019 2100      Imaging Results:    No results found.     Assessment & Plan:    Principal Problem:   SBP (spontaneous bacterial peritonitis) ([place]) Active Problems:   Hepatic cirrhosis ([facility])   Hyponatremia   GERD (gastroesophageal reflux disease)   1. Spontaneous bacterial peritonitis 1. GI recommends 2 g Rocephin daily -started 2. Consider repeat tap for reculture 3. Blood culture pending 4. GI to see in the a.m. 2. Hyponatremia 1. Chronic -last chemistries have shown sodium at 128 and 126 consistently 2. Today Na is 128 3. Continue to monitor 3. Cirrhosis 1. Continue albumin infusions as recommended by GI -25 g every 6 hours x4 today, will need to be reordered on day 3 2. Continue rifaximin, spironolactone, levocarnitine, lactulose 3. Continue to monitor 4. GERD 1. Continue PPI 2. Continue to monitor   DVT Prophylaxis-   Eliquis- SCDs   AM Labs Ordered, also please review Full Orders  Family Communication:No family at bedside  Code Status:  Full  Admission status: Observation Time spent in minutes : [place]  DO

## 2020-09-14 NOTE — Progress Notes (Signed)
Pharmacy Antibiotic Note  Trae Bovenzi is a 45 y.o. male admitted on 09/14/2020 with SBP-resistant to penicillins.  Pharmacy has been consulted for meropenem dosing.  Plan: Meropenem 1000 mg IV every 8 hours. Monitor labs, c/s, and patient improvement     Temp (24hrs), Avg:98.5 F (36.9 C), Min:98.5 F (36.9 C), Max:98.5 F (36.9 C)  Recent Labs  Lab 09/14/20 1611 09/14/20 1615  WBC 6.0  --   CREATININE 1.38*  --   LATICACIDVEN  --  0.7    CrCl cannot be calculated (Unknown ideal weight.).    Allergies  Allergen Reactions  . Chlorthalidone   . Metoprolol     Antimicrobials this admission: Meropenem 4/12 >>   Microbiology results: 4/12 BCx: pending  Thank you for allowing pharmacy to be a part of this patient's care.  Margot Ables, PharmD Clinical Pharmacist 09/14/2020 5:10 PM

## 2020-09-14 NOTE — ED Provider Notes (Signed)
[facility] EMERGENCY DEPARTMENT Provider Note   CSN: [id number] Arrival date & time: 09/14/20  1503     History CC:  Positive culture, abdominal pain  Alejandro Porter is a 45 y.o. male with history of liver cirrhosis, recurrent ascites, presenting to the ED with positive ascites culture and abdominal pain.  The patient had a therapeutic paracentesis done 5 days ago, 5 L of fluid drawn off, by Dr. Thornton Papas.  The fluid culture subsequently came back positive for staph in 1 bottle, resistant to oxacillin.  Peritoneal Cell count with 57% neutrophil.  The patient denies fevers at home.  He does report new diffuse abdominal pain, mild to moderate intensity, worse with palpation.  He denies nausea or vomiting.  He was contacted by his doctor's office and told he needs to come back to the ER.  The patient reports he has not liver transplant list.  He denies any alcohol use.  HPI     Past Medical History:  Diagnosis Date  . H/O colonoscopy   . H/O endoscopy   . Hypertension   . Liver cirrhosis ([place])   . Neuropathy   . S/P abdominal paracentesis     Patient Active Problem List   Diagnosis Date Noted  . SBP (spontaneous bacterial peritonitis) ([place]) 09/14/2020  . Dysphagia 08/05/2020  . Abdominal pain 08/05/2020  . Encephalopathy, hepatic ([place]) 05/11/2020  . Constipation 02/04/2020  . Thrombocytopenia ([place]) 10/20/2019  . Acute liver failure without hepatic coma   . Other ascites   . Colorectal polyps   . Cecal polyp   . Hepatic cirrhosis ([place]) 09/10/2019  . Edema 09/10/2019  . Portal hypertension ([place]) 09/10/2019  . Cirrhosis of liver with ascites ([place]) 09/10/2019  . 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  . Anasarca     Past Surgical History:  Procedure Laterality Date  . BIOPSY  09/12/2019   Procedure: BIOPSY;  Surgeon: Daneil Dolin, MD;  Location: [facility] END[facility];  Service: Endoscopy;;  . COLONOSCOPY N/A 09/18/2019   Procedure: COLONOSCOPY;  Surgeon: Danie Binder, MD;  Location:  [facility] END[facility];  Service: Endoscopy;  Laterality: N/A;  . ESOPHAGEAL BANDING N/A 09/12/2019   Procedure: ESOPHAGEAL BANDING;  Surgeon: Daneil Dolin, MD;  Location: [facility] END[facility] SU[facility];  Service: Endoscopy;  Laterality: N/A;  . ESOPHAGOGASTRODUODENOSCOPY N/A 09/12/2019   Procedure: ESOPHAGOGASTRODUODENOSCOPY (EGD);  Surgeon: Daneil Dolin, MD;  Location: [facility];  Service: Endoscopy;  Laterality: N/A;  . POLYPECTOMY  09/18/2019   Procedure: POLYPECTOMY;  Surgeon: Danie Binder, MD;  Location: [facility];  Service: Endoscopy;;       Family History  Problem Relation Age of Onset  . Brain cancer Mother   . Diabetes Mother   . Diabetes Father   . Pulmonary embolism Brother   . Liver disease Neg Hx     Social History   Tobacco Use  . Smoking status: Never Smoker  . Smokeless tobacco: Never Used  Vaping Use  . Vaping Use: Never used  Substance Use Topics  . Alcohol use: Never  . Drug use: Never    Home Medications Prior to Admission medications   Medication Sig Start Date End Date Taking? Authorizing Provider  apixaban (ELIQUIS) 5 MG TABS tablet Take 1 tablet (5 mg total) by mouth 2 (two) times daily. 09/22/19  Yes Emokpae, Courage, MD  Cholecalciferol (VITAMIN D3) 125 MCG (5000 UT) TABS Take 1 tablet by mouth daily. 06/17/19  Yes [provider]  Ferrous Sulfate (IRON) 325 (  65 Fe) MG TABS Take 1 tablet (325 mg total) by mouth daily. 09/22/19  Yes Emokpae, Courage, MD  furosemide (LASIX) 40 MG tablet Take 1 tablet (40 mg total) by mouth 2 (two) times daily. Patient taking differently: Take 20 mg by mouth 2 (two) times daily. 09/22/19  Yes Emokpae, Courage, MD  gabapentin (NEURONTIN) 300 MG capsule Take 1 capsule (300 mg total) by mouth at bedtime. 09/22/19  Yes Emokpae, Courage, MD  lactulose (CHRONULAC) 10 GM/15ML solution Take 15 mLs (10 g total) by mouth daily. Patient taking differently: Take 20 g by mouth 2 (two) times daily. 02/04/20  Yes Carlis Stable, NP   levOCARNitine (CARNITOR) 330 MG tablet Take 1 tablet by mouth 3 (three) times daily. 09/01/20  Yes [provider]  Multiple Vitamin (MULTIVITAMIN) tablet Take 1 tablet by mouth daily.   Yes [provider]  omeprazole (PRILOSEC) 20 MG capsule Take 1 capsule (20 mg total) by mouth daily. 09/22/19 09/21/20 Yes Emokpae, Courage, MD  propranolol (INDERAL) 20 MG tablet Take 1 tablet (20 mg total) by mouth 2 (two) times daily. 09/22/19  Yes Emokpae, Courage, MD  rifaximin (XIFAXAN) 550 MG TABS tablet Take 1 tablet (550 mg total) by mouth 2 (two) times daily. 08/26/20  Yes Carlis Stable, NP  spironolactone (ALDACTONE) 100 MG tablet Take 1 tablet (100 mg total) by mouth 2 (two) times daily with a meal. Patient taking differently: Take 50 mg by mouth 2 (two) times daily with a meal. 09/22/19  Yes Emokpae, Courage, MD  Zinc Chelated 50 MG TABS TAKE ONE TABLET BY MOUTH THREE TIMES DAILY 09/01/20  Yes [provider]  HYDROcodone-acetaminophen (NORCO/VICODIN) 5-325 MG tablet Take 1 tablet by mouth every 6 (six) hours as needed. Patient not taking: No sig reported 07/22/20   [provider]  XIFAXAN 550 MG TABS tablet TAKE 1 TABLET BY MOUTH TWICE A DAY Patient not taking: No sig reported 08/27/20   Carlis Stable, NP    Allergies    Chlorthalidone and Metoprolol  Review of Systems   Review of Systems  Constitutional: Negative for chills and fever.  Eyes: Negative for pain and visual disturbance.  Respiratory: Negative for cough and shortness of breath.   Cardiovascular: Negative for chest pain and palpitations.  Gastrointestinal: Positive for abdominal pain and nausea. Negative for vomiting.  Genitourinary: Negative for dysuria and hematuria.  Musculoskeletal: Negative for arthralgias and back pain.  Skin: Negative for color change and rash.  Neurological: Negative for syncope and light-headedness.  All other systems reviewed and are negative.   Physical Exam Updated  Vital Signs BP 107/61   Pulse 60   Temp 98.5 F (36.9 C) (Oral)   Resp (!) 23   SpO2 94%   Physical Exam Constitutional:      General: He is not in acute distress. HENT:     Head: Normocephalic and atraumatic.  Eyes:     Conjunctiva/sclera: Conjunctivae normal.     Pupils: Pupils are equal, round, and reactive to light.  Cardiovascular:     Rate and Rhythm: Normal rate and regular rhythm.  Pulmonary:     Effort: Pulmonary effort is normal. No respiratory distress.  Abdominal:     Comments: Ascites, mild diffuse tenderness, fluid wave, no erythema of the skin  Skin:    General: Skin is warm and dry.  Neurological:     General: No focal deficit present.     Mental Status: He is alert and oriented to person,  place, and time. Mental status is at baseline.  Psychiatric:        Mood and Affect: Mood normal.        Behavior: Behavior normal.     ED Results / Procedures / Treatments   Labs (all labs ordered are listed, but only abnormal results are displayed) Labs Reviewed  COMPREHENSIVE METABOLIC PANEL - Abnormal; Notable for the following components:      Result Value   Sodium 128 (*)    Chloride 95 (*)    Glucose, Bld 100 (*)    BUN 31 (*)    Creatinine, Ser 1.38 (*)    Calcium 8.6 (*)    All other components within normal limits  CBC WITH DIFFERENTIAL/PLATELET - Abnormal; Notable for the following components:   RBC 4.17 (*)    HCT 38.2 (*)    Lymphs Abs 0.5 (*)    All other components within normal limits  PROTIME-INR - Abnormal; Notable for the following components:   Prothrombin Time 15.5 (*)    All other components within normal limits  CULTURE, BLOOD (ROUTINE X 2)  RESP PANEL BY RT-PCR (FLU A&B, COVID) ARPGX2  CULTURE, BLOOD (ROUTINE X 2)  LACTIC ACID, PLASMA    EKG None  Radiology No results found.  Procedures Procedures   Medications Ordered in ED Medications  cefTRIAXone (ROCEPHIN) 2 g in sodium chloride 0.9 % 100 mL IVPB (2 g Intravenous New  Bag/Given 09/14/20 1812)  albumin human 25 % solution 25 g (has no administration in time range)    ED Course  I have reviewed the triage vital signs and the nursing notes.  Pertinent labs & imaging results that were available during my care of the patient were reviewed by me and considered in my medical decision making (see chart for details).  This patient complains of abdominal pain, positive peritoneal ascites culture from 5 days ago paracentesis.  This is concerning for SBP.  He is not toxic appearing.  Pain is mild to moderate.  I don't think he needs another therapeutic paracentesis at this time.  They are done every 2 weeks.  He feels his volume in the abdomen is moderate now, but not uncomfortable.  He may need a recurrent diagnostic tap today or tomorrow.  Doubt sepsis clinically  I ordered, reviewed, and interpreted labs.    WBC 6.0 I ordered medication IV meropenem for SBP (PCN resistance per culture results) Previous records obtained and reviewed showing culture results  Blood cx ordered - he'll need admission for bacteremia rule out, possible repeat diagnostic paracentesis, and narrowing of antibiotics.  Clinical Course as of 09/14/20 1828  Tue Sep 14, 2020  1646 CMP values at baseline, no significant changes from 1 month ago. [MT]  1657 Paged for admission [MT]  1700 Signed out to hospitalist, ordered IV meropenem [MT]    Clinical Course User Index [MT] Demarrio Menges, Carola Rhine, MD    Final Clinical Impression(s) / ED Diagnoses Final diagnoses:  SBP (spontaneous bacterial peritonitis) [facility])    Rx / DC Orders ED Discharge Orders    None       Audra Bellard, Carola Rhine, MD 09/14/20 [phone]

## 2020-09-15 DIAGNOSIS — K429 Umbilical hernia without obstruction or gangrene: Secondary | ICD-10-CM | POA: Diagnosis present

## 2020-09-15 DIAGNOSIS — E871 Hypo-osmolality and hyponatremia: Secondary | ICD-10-CM | POA: Diagnosis present

## 2020-09-15 DIAGNOSIS — R188 Other ascites: Secondary | ICD-10-CM | POA: Diagnosis not present

## 2020-09-15 DIAGNOSIS — K652 Spontaneous bacterial peritonitis: Secondary | ICD-10-CM | POA: Diagnosis not present

## 2020-09-15 DIAGNOSIS — G8929 Other chronic pain: Secondary | ICD-10-CM | POA: Diagnosis present

## 2020-09-15 DIAGNOSIS — D649 Anemia, unspecified: Secondary | ICD-10-CM | POA: Diagnosis present

## 2020-09-15 DIAGNOSIS — I81 Portal vein thrombosis: Secondary | ICD-10-CM | POA: Diagnosis present

## 2020-09-15 DIAGNOSIS — K7290 Hepatic failure, unspecified without coma: Secondary | ICD-10-CM | POA: Diagnosis present

## 2020-09-15 DIAGNOSIS — B957 Other staphylococcus as the cause of diseases classified elsewhere: Secondary | ICD-10-CM | POA: Diagnosis present

## 2020-09-15 DIAGNOSIS — K7581 Nonalcoholic steatohepatitis (NASH): Secondary | ICD-10-CM | POA: Diagnosis present

## 2020-09-15 DIAGNOSIS — N182 Chronic kidney disease, stage 2 (mild): Secondary | ICD-10-CM | POA: Diagnosis present

## 2020-09-15 DIAGNOSIS — Z79899 Other long term (current) drug therapy: Secondary | ICD-10-CM | POA: Diagnosis not present

## 2020-09-15 DIAGNOSIS — R109 Unspecified abdominal pain: Secondary | ICD-10-CM | POA: Diagnosis present

## 2020-09-15 DIAGNOSIS — K766 Portal hypertension: Secondary | ICD-10-CM | POA: Diagnosis present

## 2020-09-15 DIAGNOSIS — I8510 Secondary esophageal varices without bleeding: Secondary | ICD-10-CM | POA: Diagnosis present

## 2020-09-15 DIAGNOSIS — R251 Tremor, unspecified: Secondary | ICD-10-CM | POA: Diagnosis present

## 2020-09-15 DIAGNOSIS — K7460 Unspecified cirrhosis of liver: Secondary | ICD-10-CM

## 2020-09-15 DIAGNOSIS — D6959 Other secondary thrombocytopenia: Secondary | ICD-10-CM | POA: Diagnosis present

## 2020-09-15 DIAGNOSIS — G629 Polyneuropathy, unspecified: Secondary | ICD-10-CM | POA: Diagnosis present

## 2020-09-15 DIAGNOSIS — Z20822 Contact with and (suspected) exposure to covid-19: Secondary | ICD-10-CM | POA: Diagnosis present

## 2020-09-15 DIAGNOSIS — I129 Hypertensive chronic kidney disease with stage 1 through stage 4 chronic kidney disease, or unspecified chronic kidney disease: Secondary | ICD-10-CM | POA: Diagnosis present

## 2020-09-15 DIAGNOSIS — K219 Gastro-esophageal reflux disease without esophagitis: Secondary | ICD-10-CM | POA: Diagnosis present

## 2020-09-15 DIAGNOSIS — Z7901 Long term (current) use of anticoagulants: Secondary | ICD-10-CM | POA: Diagnosis not present

## 2020-09-15 DIAGNOSIS — Z28310 Unvaccinated for covid-19: Secondary | ICD-10-CM | POA: Diagnosis not present

## 2020-09-15 LAB — COMPREHENSIVE METABOLIC PANEL
ALT: 14 U/L (ref 0–44)
AST: 20 U/L (ref 15–41)
Albumin: 3.4 g/dL — ABNORMAL LOW (ref 3.5–5.0)
Alkaline Phosphatase: 74 U/L (ref 38–126)
Anion gap: 10 (ref 5–15)
BUN: 27 mg/dL — ABNORMAL HIGH (ref 6–20)
CO2: 24 mmol/L (ref 22–32)
Calcium: 8.9 mg/dL (ref 8.9–10.3)
Chloride: 98 mmol/L (ref 98–111)
Creatinine, Ser: 1.23 mg/dL (ref 0.61–1.24)
GFR, Estimated: >60 mL/min (ref >60)
Glucose, Bld: 77 mg/dL (ref 70–99)
Potassium: 4.8 mmol/L (ref 3.5–5.1)
Sodium: 132 mmol/L — ABNORMAL LOW (ref 135–145)
Total Bilirubin: 0.8 mg/dL (ref 0.3–1.2)
Total Protein: 6.7 g/dL (ref 6.5–8.1)

## 2020-09-15 LAB — CBC
HCT: 34.5 % — ABNORMAL LOW (ref 39.0–52.0)
Hemoglobin: 11.1 g/dL — ABNORMAL LOW (ref 13.0–17.0)
MCH: 29.9 pg (ref 26.0–34.0)
MCHC: 32.2 g/dL (ref 30.0–36.0)
MCV: 93 fL (ref 80.0–100.0)
Platelets: 112 10*3/uL — ABNORMAL LOW (ref 150–400)
RBC: 3.71 MIL/uL — ABNORMAL LOW (ref 4.22–5.81)
RDW: 15.5 % (ref 11.5–15.5)
WBC: 4.6 10*3/uL (ref 4.0–10.5)
nRBC: 0 % (ref 0.0–0.2)

## 2020-09-15 LAB — MAGNESIUM: Magnesium: 2 mg/dL (ref 1.7–2.4)

## 2020-09-15 LAB — HIV ANTIBODY (ROUTINE TESTING W REFLEX): HIV Screen 4th Generation wRfx: NONREACTIVE

## 2020-09-15 MED ORDER — VANCOMYCIN HCL 2000 MG/400ML IV SOLN
2000.0000 mg | Freq: Once | INTRAVENOUS | Status: AC
  Administered 2020-09-15: 2000 mg via INTRAVENOUS
  Filled 2020-09-15: qty 400

## 2020-09-15 MED ORDER — VANCOMYCIN HCL 1250 MG/250ML IV SOLN
1250.0000 mg | Freq: Two times a day (BID) | INTRAVENOUS | Status: DC
  Administered 2020-09-15 – 2020-09-16 (×2): 1250 mg via INTRAVENOUS
  Filled 2020-09-15 (×2): qty 250

## 2020-09-15 NOTE — Consult Note (Signed)
@LOGO @   Referring Provider: [facility]  Primary Care Physician:  Sofie Rower, PA-C Primary Gastroenterologist:  Dr. Gala Romney  Date of Admission: 09/14/20 Date of Consultation: 09/15/20  Reason for Consultation:  SBP   HPI:  Alejandro Porter is a 45 y.o. year old male with history of cirrhosis, portal hypertension, grade 1/2 esophageal varices on NSBB, portal vein thrombosis on Eliquis, hepatic encephalopathy on lactulose and Xifaxan, thrombocytopenia, lower extremity edema, and ascites requiring repeat paracentesis, and undergoing evaluation for liver transplant in [place], now admitted with SBP after undergoing outpatient paracentesis with cultures positive for Staphylococcus Auricularis.  In the ED, he was hemodynamically stable. Overall, laboratory evaluation reassuring and fairly consistent with his baseline.  Mild hyponatremia with sodium 128, creatinine 1.38, LFTs within normal limits.  WBC, hemoglobin, platelets within normal limits.  INR 1.2.  Lactic acid 0.7.  Blood cultures were drawn.   Today he states he is feeling better. Abdominal pain is improving.  Reports chronic abdominal pain that worsens as he gains more fluid in his abdomen.  Abdominal pain improved after paracentesis.  States he had increased abdominal pain just before his last paracentesis, about the same as it always is with improvement after fluid was removed.  He did continue with mild abdominal pain, but nothing significant.  Today, pain feels better than yesterday and is primarily in the upper abdomen.  Denies nausea, vomiting, fever, chills.  Denies swelling in his lower extremities.  Weighs himself daily.  States he weighed 219 pounds after paracentesis last Thursday.  Yesterday, before coming to the emergency room, he weighed 227 pounds.  In general, he gains 10 to 15 pounds between paracentesis.  He takes his diuretics as prescribed.  Denies change in mental status.  Chronically, he tends to be  forgetful, but currently at baseline.  Takes lactulose daily and Xifaxan twice daily.  Bowel movements 0-2 times daily.  He did not increase lactulose after seeing Alejandro Porter in the office.  Denies BRBPR or melena.  GERD remains well controlled on Protonix 40 mg daily.  Past Medical History:  Diagnosis Date  . H/O colonoscopy   . H/O endoscopy   . Hypertension   . Liver cirrhosis ([place])   . Neuropathy   . S/P abdominal paracentesis     Past Surgical History:  Procedure Laterality Date  . BIOPSY  09/12/2019   Procedure: BIOPSY;  Surgeon: Daneil Dolin, MD;  [facility];  Service: Endoscopy;;  . COLONOSCOPY N/A 09/18/2019   Procedure: COLONOSCOPY;  Surgeon: Danie Binder, MD;  [facility];  Service: Endoscopy;  Laterality: N/A;  . ESOPHAGEAL BANDING N/A 09/12/2019   Procedure: ESOPHAGEAL BANDING;  Surgeon: Daneil Dolin, MD;  [facility];  Service: Endoscopy;  Laterality: N/A;  . ESOPHAGOGASTRODUODENOSCOPY N/A 09/12/2019   Procedure: ESOPHAGOGASTRODUODENOSCOPY (EGD);  Surgeon: Daneil Dolin, MD;  [facility]: [facility];  Service: Endoscopy;  Laterality: N/A;  . POLYPECTOMY  09/18/2019   Procedure: POLYPECTOMY;  Surgeon: Danie Binder, MD;  [facility];  Service: Endoscopy;;    Prior to Admission medications   Medication Sig Start Date End Date Taking? Authorizing Provider  apixaban (ELIQUIS) 5 MG TABS tablet Take 1 tablet (5 mg total) by mouth 2 (two) times daily. 09/22/19  Yes Emokpae, Courage, MD  Cholecalciferol (VITAMIN D3) 125 MCG (5000 UT) TABS Take 1 tablet by mouth daily. 06/17/19  Yes [provider]  Ferrous Sulfate (IRON) 325 (65 Fe) MG TABS Take 1  tablet (325 mg total) by mouth daily. 09/22/19  Yes Emokpae, Courage, MD  furosemide (LASIX) 40 MG tablet Take 1 tablet (40 mg total) by mouth 2 (two) times daily. Patient taking differently: Take 20 mg by mouth 2 (two) times daily. 09/22/19  Yes Emokpae, Courage, MD  gabapentin  (NEURONTIN) 300 MG capsule Take 1 capsule (300 mg total) by mouth at bedtime. 09/22/19  Yes Emokpae, Courage, MD  lactulose (CHRONULAC) 10 GM/15ML solution Take 15 mLs (10 g total) by mouth daily. Patient taking differently: Take 20 g by mouth 2 (two) times daily. 02/04/20  Yes Carlis Stable, NP  levOCARNitine (CARNITOR) 330 MG tablet Take 1 tablet by mouth 3 (three) times daily. 09/01/20  Yes [provider]  Multiple Vitamin (MULTIVITAMIN) tablet Take 1 tablet by mouth daily.   Yes [provider]  omeprazole (PRILOSEC) 20 MG capsule Take 1 capsule (20 mg total) by mouth daily. 09/22/19 09/21/20 Yes Emokpae, Courage, MD  propranolol (INDERAL) 20 MG tablet Take 1 tablet (20 mg total) by mouth 2 (two) times daily. 09/22/19  Yes Emokpae, Courage, MD  rifaximin (XIFAXAN) 550 MG TABS tablet Take 1 tablet (550 mg total) by mouth 2 (two) times daily. 08/26/20  Yes Carlis Stable, NP  spironolactone (ALDACTONE) 100 MG tablet Take 1 tablet (100 mg total) by mouth 2 (two) times daily with a meal. Patient taking differently: Take 50 mg by mouth 2 (two) times daily with a meal. 09/22/19  Yes Emokpae, Courage, MD  Zinc Chelated 50 MG TABS TAKE ONE TABLET BY MOUTH THREE TIMES DAILY 09/01/20  Yes [provider]  HYDROcodone-acetaminophen (NORCO/VICODIN) 5-325 MG tablet Take 1 tablet by mouth every 6 (six) hours as needed. Patient not taking: No sig reported 07/22/20   [provider]  XIFAXAN 550 MG TABS tablet TAKE 1 TABLET BY MOUTH TWICE A DAY Patient not taking: No sig reported 08/27/20   Carlis Stable, NP    Current Facility-Administered Medications  Medication Dose Route Frequency Provider Last Rate Last Admin  . albumin human 25 % solution 25 g  25 g Intravenous Q6H Zierle-Ghosh, Asia B, DO 60 mL/hr at 09/15/20 0505 25 g at 09/15/20 0505  . apixaban (ELIQUIS) tablet 5 mg  5 mg Oral BID Zierle-Ghosh, Asia B, DO   5 mg at 09/14/20 2124  . cefTRIAXone (ROCEPHIN) 2 g in sodium chloride  0.9 % 100 mL IVPB  2 g Intravenous Q24H Zierle-Ghosh, Asia B, DO 200 mL/hr at 09/14/20 1812 2 g at 09/14/20 1812  . furosemide (LASIX) tablet 20 mg  20 mg Oral BID Zierle-Ghosh, Asia B, DO      . lactulose (CHRONULAC) 10 GM/15ML solution 20 g  20 g Oral BID Zierle-Ghosh, Asia B, DO   20 g at 09/14/20 2124  . levOCARNitine (CARNITOR) 1 GM/10ML solution 330 mg  330 mg Oral TID Zierle-Ghosh, Asia B, DO      . ondansetron (ZOFRAN) tablet 4 mg  4 mg Oral Q6H PRN Zierle-Ghosh, Asia B, DO       Or  . ondansetron (ZOFRAN) injection 4 mg  4 mg Intravenous Q6H PRN Zierle-Ghosh, Asia B, DO      . pantoprazole (PROTONIX) EC tablet 40 mg  40 mg Oral Daily Zierle-Ghosh, Asia B, DO   40 mg at 09/14/20 2127  . propranolol (INDERAL) tablet 20 mg  20 mg Oral BID Zierle-Ghosh, Asia B, DO   20 mg at 09/14/20 2124  . rifaximin (XIFAXAN) tablet 550 mg  550 mg Oral BID Zierle-Ghosh, Asia B, DO   550 mg at 09/14/20 2124  . spironolactone (ALDACTONE) tablet 50 mg  50 mg Oral BID WC Zierle-Ghosh, Asia B, DO      . traMADol (ULTRAM) tablet 50 mg  50 mg Oral Q8H PRN Zierle-Ghosh, Asia B, DO   50 mg at 09/14/20 2130    Allergies as of 09/14/2020 - Review Complete 09/14/2020  Allergen Reaction Noted  . Chlorthalidone  11/04/2019  . Metoprolol  11/04/2019    Family History  Problem Relation Age of Onset  . Brain cancer Mother   . Diabetes Mother   . Diabetes Father   . Pulmonary embolism Brother   . Liver disease Neg Hx     Social History   Socioeconomic History  . Marital status: Single    Spouse name: Not on file  . Number of children: Not on file  . Years of education: Not on file  . Highest education level: Not on file  Occupational History  . Occupation: umemployed  Tobacco Use  . Smoking status: Never Smoker  . Smokeless tobacco: Never Used  Vaping Use  . Vaping Use: Never used  Substance and Sexual Activity  . Alcohol use: Never  . Drug use: Never  . Sexual activity: Not on file  Other Topics  Concern  . Not on file  Social History Narrative  . Not on file   Social Determinants of Health   Financial Resource Strain: Not on file  Food Insecurity: Not on file  Transportation Needs: Not on file  Physical Activity: Not on file  Stress: Not on file  Social Connections: Not on file  Intimate Partner Violence: Not on file    Review of Systems: Gen: Denies fever, chills, cold or flulike symptoms, presyncope, syncope. CV: Denies chest pain or palpitations. Resp: Denies shortness of breath or cough. GI: See HPI Heme: See HPI  Physical Exam: Vital signs in last 24 hours: Temp:  [98 F (36.7 C)-98.5 F (36.9 C)] 98 F (36.7 C) (04/13 0451) Pulse Rate:  [56-62] 56 (04/13 0451) Resp:  [16-23] 20 (04/13 0451) BP: (91-108)/(56-71) 91/56 (04/13 0451) SpO2:  [94 %-100 %] 99 % (04/13 0451) FiO2 (%):  [21 %] 21 % (04/12 1920)   General:   Alert,  Well-developed, well-nourished, pleasant and cooperative in NAD Head:  Normocephalic and atraumatic. Eyes:  Sclera clear, no icterus.   Conjunctiva pink. Ears:  Normal auditory acuity. Lungs:  Clear throughout to auscultation.   No wheezes, crackles, or rhonchi. No acute distress. Heart:  Regular rate and rhythm; no murmurs, clicks, rubs,  or gallops. Abdomen:  Soft and nondistended. Mild ttp in the epigastric and RUQ region. No masses, hepatosplenomegaly or hernias noted. Normal bowel sounds, without guarding, and without rebound.  Rectal:  Deferred Msk:  Symmetrical without gross deformities. Normal posture. Extremities:  Without edema. Neurologic:  Alert and  oriented x4;  grossly normal neurologically.  Psych: Normal mood and affect.  Intake/Output from previous day: No intake/output data recorded. Intake/Output this shift: No intake/output data recorded.  Lab Results: Recent Labs    09/14/20 1611 09/15/20 0434  WBC 6.0 4.6  HGB 13.0 11.1*  HCT 38.2* 34.5*  PLT 165 112*   BMET Recent Labs    09/14/20 1611  09/15/20 0434  NA 128* 132*  K 4.5 4.8  CL 95* 98  CO2 26 24  GLUCOSE 100* 77  BUN 31* 27*  CREATININE 1.38* 1.23  CALCIUM 8.6* 8.9  LFT Recent Labs    09/14/20 1611 09/15/20 0434  PROT 7.3 6.7  ALBUMIN 3.5 3.4*  AST 21 20  ALT 16 14  ALKPHOS 80 74  BILITOT 0.6 0.8   PT/INR Recent Labs    09/14/20 1611  LABPROT 15.5*  INR 1.2    Impression: Josyah Achor is a 45 y.o. year old male with history of cirrhosis, portal hypertension, grade 1/2 esophageal varices on NSBB, portal vein thrombosis on Eliquis, hepatic encephalopathy on lactulose and Xifaxan, thrombocytopenia, lower extremity edema, and ascites requiring repeat paracentesis, and undergoing evaluation in [facility] for possible consideration of transplant, now admitted with SBP after undergoing outpatient paracentesis with cultures positive for Staphylococcus Auricularis, PMNs 248.   Clinically, patient has had some abdominal pain though he reports chronic abdominal pain that worsens as fluid accumulates in his abdomen and improves after paracentesis. Since presenting to the ED yesterday, he reports some improvement with only mild upper abdominal pain at this time. No nausea, vomiting, or fever. WBC remains within normal limits. He has been started on rocephin 2g daily.  Due to concern on whether Rocephin would cover bacteria that grew on his culture, Dr. Denton Brick changed antibiotics to vancomycin and has reached out to ID.  He also received Albumin yesterday in light of CKD and Cr >1, and he will need albumin again on day 3. Due to SBP, we will need to hold his NSBB due to increased risk of SBP and moving forward he will need to be on SBP prophylaxis.   There has been some discussion from the hospitalist today about patient discharging tomorrow and completing IV antibiotics as an outpatient. I will discuss with Dr. Laural Golden.   Cirrhosis: MELD 10. Clinically doing well aside from SBP as discussed above. He is compliant with  his medications at home and does his best to follow a 2g sodium diet. He is being evaluated in [place] and has an upcoming appointment for consideration of transplant evaluation.   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 Reports baseline 0-2 BMs daily with Lactulose daily. Advised he should increase Lactulose to twice daily and possible 3 times daily to allow him to have 2-3 soft BMs daily.   Plan: 1.  Continue IV Vancomycin  2.  Further recommendations regarding antibiotics pending discussion with ID. 3.  Complete IV albumin 25g q6 hours x4 doses. He has received 2 out of 4 infusions thus far. 4.  He will need Albumin on day 3 as well with 100 g total. 5.  Will discuss with Dr. Laural Golden about possible repeat para in a few days to evaluate reponse to antibiotics.   6.  Will also discuss with Dr. Laural Golden about possible discharge home in the near future to continue IV antibiotics as recommended by Dr. Denton Brick.   7.  Stop nonselective beta-blocker due to increased risk of SBP. 8.  Patient will need to start SBP prophylaxis after completing antibiotics for current SBP. 9.  Lactulose 20 g twice daily.  May need to titrate up to achieve 2-3 soft BMs daily. 10.  Continue Xifaxan 550 mg twice daily. 11.  Continue Lasix and Aldactone.   LOS: 0 days    09/15/2020, 7:38 AM   Aliene Altes, PA-C [facility]

## 2020-09-15 NOTE — Progress Notes (Signed)
Pharmacy Antibiotic Note  Alejandro Porter is a 45 y.o. male admitted on 09/14/2020 with CONS in peritoneal fluid.  Pharmacy has been consulted for Vancomycin dosing.  Plan: Vancomycin 2000 mg loading dose, then 1258m IV q12h for AUC 491 F/U cxs and clinical progress Monitor labs, c/s, and levels as indicated  Height: 5' 8"  (172.7 cm) Weight: 101.4 kg (223 lb 9.6 oz) IBW/kg (Calculated) : 68.4  Temp (24hrs), Avg:98.2 F (36.8 C), Min:98 F (36.7 C), Max:98.5 F (36.9 C)  Recent Labs  Lab 09/14/20 1611 09/14/20 1615 09/15/20 0434  WBC 6.0  --  4.6  CREATININE 1.38*  --  1.23  LATICACIDVEN  --  0.7  --     Estimated Creatinine Clearance: 87.5 mL/min (by C-G formula based on SCr of 1.23 mg/dL).    Allergies  Allergen Reactions  . Chlorthalidone   . Metoprolol     Antimicrobials this admission: Meropenem 4/12 >> 4/12 Ceftriaxone 4/12> 4/13 Vancomycin 4/13>>  Microbiology results: 4/7 Peritoneal CX: Staphylococcus Auricularis S- Cipro, clinda, Vancomycin , TCN, Septra  R- Oxacillin 4/12 BCx: ngtd  Thank you for allowing pharmacy to be a part of this patient's care.  LIsac Sarna BS Pharm D, BCaliforniaClinical Pharmacist Pager #[phone] 45444/13/2022 11:45 AM

## 2020-09-15 NOTE — Progress Notes (Signed)
Patient Demographics:    Alejandro Porter, is a 45 y.o. male, DOB - 07-09-1975, CNO:[id number]  Admit date - 09/14/2020   Admitting Physician Lundyn Coste Denton Brick, MD  Outpatient Primary MD for the patient is Mass[place], O'Laf, PA-C  LOS - 0   No chief complaint on file.       Subjective:    Alejandro Porter today has no fevers, no emesis,  No chest pain,   -Abdominal pain is not worse today -Oral intake is fair  Assessment  & Plan :    Principal Problem:   Staph Auricularis SBP (spontaneous bacterial peritonitis) (HCC) Active Problems:   Hepatic cirrhosis (HCC)   Cirrhosis of liver with ascites (HCC)   Hyponatremia   GERD (gastroesophageal reflux disease)  Brief Summary:- 45 y.o. 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 Collene Mares in [place]--- possible discharge on p.o. Bactrim in 1 to 2 days -We need repeat paracentesis with cell count   2)Decompensated Liver Cirrhosis Suspect NASH------ with recurrent ascites Viral hepatitis profile negative, patient denies significant EtOH use -s/p palliative paracentesis on 09/09/2020 -SBP as above #1 -Continue Lasix, Aldactone ,  lactulose and rifaximin  3)Portal vein thrombosis, nonocclusive, extending into right intrahepatic portal vein, first noted at time of CT 08/16/20--- c/n apixaban -Propranolol hold due to soft BP  4)GERD-continue Protonix  5) chronic anemia/thrombocytopenia--hemoglobin currently 11 which is close to baseline, monitor closely--anemia and  thrombocytopenia most likely related to underlying liver cirrhosis -No bleeding concerns  6) chronic hyponatremia----due to liver cirrhosis, avoid excessive free water  Disposition/Need for in-Hospital Stay- patient unable to be discharged at this time due to -staph SBP requiring IV antibiotics and IV albumin  Status is: Inpatient  Remains inpatient appropriate because:Please see above   Disposition: The patient is from: Home              Anticipated d/c is to: Home              Anticipated d/c date is: 2 days              Patient currently is not medically stable to d/c. Barriers: Not Clinically Stable-   Code Status : -  Code Status: Full Code   Family Communication:    NA (patient is alert, awake and coherent)   Consults  :  Gi  DVT Prophylaxis  :   - SCDs   SCDs Start: 09/14/20 1920 apixaban (ELIQUIS) tablet 5 mg    Lab Results  Component Value Date   PLT 112 (L) 09/15/2020    Inpatient Medications  Scheduled Meds: . apixaban  5 mg Oral BID  . furosemide  20 mg Oral BID  . lactulose  20 g Oral BID  . levOCARNitine  330 mg Oral TID  . pantoprazole  40 mg Oral Daily  . rifaximin  550 mg Oral BID  . spironolactone  50 mg Oral BID WC   Continuous Infusions: . albumin  human 25 g (09/15/20 1303)  . vancomycin     PRN Meds:.ondansetron **OR** ondansetron (ZOFRAN) IV, traMADol    Anti-infectives (From admission, onward)   Start     Dose/Rate Route Frequency Ordered Stop   09/15/20 2200  vancomycin (VANCOREADY) IVPB 1250 mg/250 mL        1,250 mg 166.7 mL/hr over 90 Minutes Intravenous Every 12 hours 09/15/20 1224     09/15/20 1000  vancomycin (VANCOREADY) IVPB 2000 mg/400 mL        2,000 mg 200 mL/hr over 120 Minutes Intravenous  Once 09/15/20 0922 09/15/20 1249   09/14/20 2200  rifaximin (XIFAXAN) tablet 550 mg        550 mg Oral 2 times daily 09/14/20 1919     09/14/20 1745  cefTRIAXone (ROCEPHIN) 2 g in sodium chloride 0.9 % 100 mL IVPB  Status:   Discontinued        2 g 200 mL/hr over 30 Minutes Intravenous Every 24 hours 09/14/20 1733 09/15/20 1153   09/14/20 1730  meropenem (MERREM) 1 g in sodium chloride 0.9 % 100 mL IVPB  Status:  Discontinued        1 g 200 mL/hr over 30 Minutes Intravenous Every 8 hours 09/14/20 1708 09/14/20 1733        Objective:   Vitals:   09/15/20 0451 09/15/20 0900 09/15/20 0930 09/15/20 1343  BP: (!) 91/56   101/76  Pulse: (!) 56   62  Resp: 20   17  Temp: 98 F (36.7 C)   98 F (36.7 C)  TempSrc:    Oral  SpO2: 99%   98%  Weight:  101 kg 101.4 kg   Height:  5' 8"  (1.727 m)      Wt Readings from Last 3 Encounters:  09/15/20 101.4 kg  08/05/20 101.6 kg  07/19/20 99.7 kg     Intake/Output Summary (Last 24 hours) at 09/15/2020 1703 Last data filed at 09/15/2020 1500 Gross per 24 hour  Intake 894.69 ml  Output --  Net 894.69 ml     Physical Exam  Gen:- Awake Alert,  In no apparent distress  HEENT:- [place].AT, No sclera icterus Neck-Supple Neck,No JVD,.  Lungs-  CTAB , fair symmetrical air movement CV- S1, S2 normal, regular  Abd-  +ve B.Sounds, Abd Soft, generalized abdominal tenderness, less distended, Extremity/Skin:-   pedal pulses present  Psych-affect is appropriate, oriented x3 Neuro-no new focal deficits, no tremors   Data Review:   Micro Results Recent Results (from the past 240 hour(s))  Culture, body fluid w Gram Stain-bottle     Status: Abnormal   Collection Time: 09/09/20 11:44 AM   Specimen: Peritoneal Washings  Result Value Ref Range Status   Specimen Description   Final    PERITONEAL 10 CC Performed at [facility], [address], [place], [name] [address]    Special Requests   Final    BOTTLES DRAWN AEROBIC AND ANAEROBIC Performed at [facility], [address] [place], [place] [address]    Gram Stain   Final    GRAM POSITIVE COCCI AEROBIC BOTTLE ONLY Gram Stain Report Called to,Read Back By and Verified With: DR. Abbey Chatters @1715  BY MATTHEWS,B  4.9.22 Performed at [facility] [address] [place], [place] [address]    Culture STAPHYLOCOCCUS AURICULARIS (A)  Final   Report Status 09/14/2020 FINAL  Final   Organism ID, Bacteria STAPHYLOCOCCUS AURICULARIS  Final      Susceptibility   Staphylococcus  auricularis - MIC*    CIPROFLOXACIN <=0.5 SENSITIVE Sensitive     ERYTHROMYCIN 0.5 SENSITIVE Sensitive     GENTAMICIN <=0.5 SENSITIVE Sensitive     OXACILLIN 0.5 RESISTANT Resistant     TETRACYCLINE <=1 SENSITIVE Sensitive     VANCOMYCIN 1 SENSITIVE Sensitive     TRIMETH/SULFA <=10 SENSITIVE Sensitive     CLINDAMYCIN <=0.25 SENSITIVE Sensitive     RIFAMPIN <=0.5 SENSITIVE Sensitive     Inducible Clindamycin NEGATIVE Sensitive     * STAPHYLOCOCCUS AURICULARIS  Gram stain     Status: None   Collection Time: 09/09/20 11:44 AM   Specimen: Peritoneal Washings  Result Value Ref Range Status   Specimen Description PERITONEAL  Final   Special Requests NONE  Final   Gram Stain   Final    NO ORGANISMS SEEN WBC PRESENT, PREDOMINANTLY MONONUCLEAR Performed at [facility], [address], [place], [place] [address]    Report Status 09/09/2020 FINAL  Final  Resp Panel by RT-PCR (Flu A&B, Covid) Nasopharyngeal Swab     Status: None   Collection Time: 09/14/20  4:10 PM   Specimen: Nasopharyngeal Swab; Nasopharyngeal(NP) swabs in vial transport medium  Result Value Ref Range Status   SARS Coronavirus 2 by RT PCR NEGATIVE NEGATIVE Final    Comment: (NOTE) SARS-CoV-2 target nucleic acids are NOT DETECTED.  The SARS-CoV-2 RNA is generally detectable in upper respiratory specimens during the acute phase of infection. The lowest concentration of SARS-CoV-2 viral copies this assay can detect is 138 copies/mL. A negative result does not preclude SARS-Cov-2 infection and should not be used as the sole basis for treatment or other patient management decisions. A negative result may occur with  improper specimen collection/handling,  submission of specimen other than nasopharyngeal swab, presence of viral mutation(s) within the areas targeted by this assay, and inadequate number of viral copies(<138 copies/mL). A negative result must be combined with clinical observations, patient history, and epidemiological information. The expected result is Negative.  Fact Sheet for Patients:  EntrepreneurPulse.com.au  Fact Sheet for Healthcare Providers:  IncredibleEmployment.be  This test is no t yet approved or cleared by the [place] FDA and  has been authorized for detection and/or diagnosis of SARS-CoV-2 by FDA under an Emergency Use Authorization (EUA). This EUA will remain  in effect (meaning this test can be used) for the duration of the COVID-19 declaration under Section 564(b)(1) of the Act, 21 U.S.C.section 360bbb-3(b)(1), unless the authorization is terminated  or revoked sooner.       Influenza A by PCR NEGATIVE NEGATIVE Final   Influenza B by PCR NEGATIVE NEGATIVE Final    Comment: (NOTE) The Xpert Xpress SARS-CoV-2/FLU/RSV plus assay is intended as an aid in the diagnosis of influenza from Nasopharyngeal swab specimens and should not be used as a sole basis for treatment. Nasal washings and aspirates are unacceptable for Xpert Xpress SARS-CoV-2/FLU/RSV testing.  Fact Sheet for Patients: EntrepreneurPulse.com.au  Fact Sheet for Healthcare Providers: IncredibleEmployment.be  This test is not yet approved or cleared by the [place] FDA and has been authorized for detection and/or diagnosis of SARS-CoV-2 by FDA under an Emergency Use Authorization (EUA). This EUA will remain in effect (meaning this test can be used) for the duration of the COVID-19 declaration under Section 564(b)(1) of the Act, 21 U.S.C. section 360bbb-3(b)(1), unless the authorization is terminated or revoked.  Performed at [facility], [address] [place], [place] [address]   Blood culture (routine x 2)  Status: None (Preliminary result)   Collection Time: 09/14/20  4:11 PM   Specimen: BLOOD  Result Value Ref Range Status   Specimen Description BLOOD LEFT ANTECUBITAL  Final   Special Requests   Final    BOTTLES DRAWN AEROBIC AND ANAEROBIC Blood Culture adequate volume   Culture   Final    NO GROWTH < 24 HOURS Performed at [facility], [address] [place], [place] [address]    Report Status PENDING  Incomplete  Blood culture (routine x 2)     Status: None (Preliminary result)   Collection Time: 09/14/20  5:16 PM   Specimen: BLOOD  Result Value Ref Range Status   Specimen Description BLOOD RIGHT ANTECUBITAL  Final   Special Requests   Final    Blood Culture adequate volume BOTTLES DRAWN AEROBIC AND ANAEROBIC   Culture   Final    NO GROWTH < 24 HOURS Performed at [facility], [address], [place], [place] [address]    Report Status PENDING  Incomplete    Radiology Reports CT Abdomen Pelvis W Contrast  Result Date: 08/24/2020 CLINICAL DATA:  Epigastric pain Abdominal pain, acute, nonlocalized Portal venous thrombus, cirrhosis, hepatoma screening EXAM: CT ABDOMEN AND PELVIS WITH CONTRAST TECHNIQUE: Multidetector CT imaging of the abdomen and pelvis was performed using the standard protocol following bolus administration of intravenous contrast. CONTRAST:  145m OMNIPAQUE IOHEXOL 300 MG/ML  SOLN COMPARISON:  CT August 17, 2019 FINDINGS: Lower chest: There are clustered tree and bud nodules in the dependent portions of the left greater than right bases measuring up to 2.5 cm on image 5/2 which are intimately associated with bronchi/bronchioles, these are most likely infectious or inflammatory possibly related to aspiration, recommend clinical and possibly bronchoscopic evaluation in this patient which is immunocompromised 2/2 hepatic dysfunction. No pleural effusion. Hepatobiliary: Cirrhotic hepatic morphology. No arterially  enhancing hepatic lesions. Tiny layering cholelithiasis without gallbladder distension. There is mild gallbladder wall thickening likely related to hepatic dysfunction. No biliary ductal dilation. Pancreas: Unremarkable. No pancreatic ductal dilatation or surrounding inflammatory changes. Spleen: Splenomegaly measuring up to 19.1 cm in maximum axial dimension. 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 Right adrenal glands unremarkable. No hydronephrosis, nephrolithiasis, or suspicious renal masses. Urinary bladder is grossly unremarkable for degree of distension. Stomach/Bowel: Small hiatal hernia otherwise the stomach is grossly unremarkable. No evidence of small-bowel obstruction. No suspicious colonic wall thickening or mass like lesions. Left-sided colonic diverticulosis without findings of acute diverticulitis. Vascular/Lymphatic: Scattered aortic atherosclerosis without aneurysmal dilation. The portal vein is patent with decreased burden of nonocclusive chronic thin laminar thrombus running along the wall the portal vein extending into the right portal vein on image 52/10. The hepatic, superior mesenteric and splenic veins are patent without filling defects. Conventional hepatic arterial anatomy. Abdominopelvic collateral vessels. Again seen are prominent periportal and celiac axis lymph nodes measuring up to 13 mm on image 33/7, likely reactive. Reproductive: Prostate is unremarkable. Other: Large volume abdominopelvic ascites. Small fluid containing ventral hernia. Fat and fluid containing right inguinal hernia. Musculoskeletal: No acute osseous abnormality. Similar appearance of the superior endplate compression deformities at L1-L2. IMPRESSION: 1. Cirrhosis with sequelae of portal hypertension including large volume abdominopelvic ascites, splenomegaly, and abdominopelvic collateral vessels. No  arterially enhancing hepatic lesions. 2. Decreased burden of nonocclusive chronic thin laminar thrombus running along the wall of the portal vein and extending into the right portal vein 3. Clustered  tree and bud nodules in the dependent portions of the left greater than right lung bases measuring, most likely infectious or inflammatory in nature and possibly related to aspiration, recommend clinical correlation and possibly bronchoscopic evaluation in this patient which is immunocompromised 2/2 hepatic dysfunction. 4. Unchanged prominent upper abdominal lymph nodes which are likely reactive. 5. 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 6. Small fluid containing ventral hernia and right inguinal hernia. 7. Aortic atherosclerosis. Aortic Atherosclerosis (ICD10-I70.0). Electronically Signed   By: Dahlia Bailiff MD   On: 08/24/2020 13:52   US Paracentesis  Result Date: 09/09/2020 INDICATION: Cirrhosis, ascites EXAM: ULTRASOUND GUIDED DIAGNOSTIC AND THERAPEUTIC PARACENTESIS MEDICATIONS: None COMPLICATIONS: None immediate PROCEDURE: Informed written consent was obtained from the patient after a discussion of the risks, benefits and alternatives to treatment. A timeout was performed prior to the initiation of the procedure. Initial ultrasound scanning demonstrates a large amount of ascites within the right lower abdominal quadrant. The right lower abdomen was prepped and draped in the usual sterile fashion. 1% lidocaine was used for local anesthesia. Following this, a 5 [place] Yueh catheter was introduced. An ultrasound image was saved for documentation purposes. The paracentesis was performed. The catheter was removed and a dressing was applied. The patient tolerated the procedure well without immediate post procedural complication. Patient received post-procedure intravenous albumin; see nursing notes for details. FINDINGS: A total of  approximately 5 L of yellow ascitic fluid was removed. Samples were sent to the laboratory as requested by the clinical team. IMPRESSION: Successful ultrasound-guided paracentesis yielding 5 liters of peritoneal fluid. Electronically Signed   By: Lavonia Dana M.D.   On: 09/09/2020 12:32   US Paracentesis  Result Date: 08/18/2020 INDICATION: Cirrhosis, ascites EXAM: ULTRASOUND GUIDED DIAGNOSTIC AND THERAPEUTIC PARACENTESIS MEDICATIONS: None COMPLICATIONS: None immediate PROCEDURE: Informed written consent was obtained from the patient after a discussion of the risks, benefits and alternatives to treatment. A timeout was performed prior to the initiation of the procedure. Initial ultrasound scanning demonstrates a large amount of ascites within the right lower abdominal quadrant. The right lower abdomen was prepped and draped in the usual sterile fashion. 1% lidocaine was used for local anesthesia. Following this, a 5 [place] Yueh catheter was introduced. An ultrasound image was saved for documentation purposes. The paracentesis was performed. The catheter was removed and a dressing was applied. The patient tolerated the procedure well without immediate post procedural complication. Patient received post-procedure intravenous albumin; see nursing notes for details. FINDINGS: A total of approximately 6.7 L of yellow ascitic fluid was removed. Samples were sent to the laboratory as requested by the clinical team. IMPRESSION: Successful ultrasound-guided paracentesis yielding 6.7 liters of peritoneal fluid. Electronically Signed   By: Lavonia Dana M.D.   On: 08/18/2020 11:00     CBC Recent Labs  Lab 09/14/20 1611 09/15/20 0434  WBC 6.0 4.6  HGB 13.0 11.1*  HCT 38.2* 34.5*  PLT 165 112*  MCV 91.6 93.0  MCH 31.2 29.9  MCHC 34.0 32.2  RDW 15.3 15.5  LYMPHSABS 0.5*  --   MONOABS 0.7  --   EOSABS 0.2  --   BASOSABS 0.0  --     Chemistries  Recent Labs  Lab 09/14/20 1611 09/15/20 0434  NA 128* 132*   K 4.5 4.8  CL 95* 98  CO2 26 24  GLUCOSE 100* 77  BUN 31* 27*  CREATININE 1.38* 1.23  CALCIUM 8.6*  8.9  MG  --  2.0  AST 21 20  ALT 16 14  ALKPHOS 80 74  BILITOT 0.6 0.8   ------------------------------------------------------------------------------------------------------------------ No results for input(s): CHOL, HDL, LDLCALC, TRIG, CHOLHDL, LDLDIRECT in the last 72 hours.  No results found for: HGBA1C ------------------------------------------------------------------------------------------------------------------ No results for input(s): TSH, T4TOTAL, T3FREE, THYROIDAB in the last 72 hours.  Invalid input(s): FREET3 ------------------------------------------------------------------------------------------------------------------ No results for input(s): VITAMINB12, FOLATE, FERRITIN, TIBC, IRON, RETICCTPCT in the last 72 hours.  Coagulation profile Recent Labs  Lab 09/14/20 1611  INR 1.2    No results for input(s): DDIMER in the last 72 hours.  Cardiac Enzymes No results for input(s): CKMB, TROPONINI, MYOGLOBIN in the last 168 hours.  Invalid input(s): CK ------------------------------------------------------------------------------------------------------------------    Component Value Date/Time   BNP 44.0 08/25/2019 1329     Kauri Garson M.D on 09/15/2020 at 5:03 PM  Go to [email or web address] - for contact info  [facility] - Office  [phone]

## 2020-09-16 ENCOUNTER — Other Ambulatory Visit: Payer: Self-pay

## 2020-09-16 ENCOUNTER — Telehealth: Payer: Self-pay | Admitting: Gastroenterology

## 2020-09-16 DIAGNOSIS — K652 Spontaneous bacterial peritonitis: Secondary | ICD-10-CM

## 2020-09-16 DIAGNOSIS — Z79899 Other long term (current) drug therapy: Secondary | ICD-10-CM

## 2020-09-16 LAB — BASIC METABOLIC PANEL
Anion gap: 9 (ref 5–15)
BUN: 30 mg/dL — ABNORMAL HIGH (ref 6–20)
CO2: 24 mmol/L (ref 22–32)
Calcium: 9.4 mg/dL (ref 8.9–10.3)
Chloride: 96 mmol/L — ABNORMAL LOW (ref 98–111)
Creatinine, Ser: 1.46 mg/dL — ABNORMAL HIGH (ref 0.61–1.24)
GFR, Estimated: >60 mL/min (ref >60)
Glucose, Bld: 108 mg/dL — ABNORMAL HIGH (ref 70–99)
Potassium: 4.9 mmol/L (ref 3.5–5.1)
Sodium: 129 mmol/L — ABNORMAL LOW (ref 135–145)

## 2020-09-16 MED ORDER — DOXYCYCLINE HYCLATE 100 MG PO TABS: 100.0000 mg | ORAL_TABLET | Freq: Two times a day (BID) | ORAL | 0 refills | Status: AC

## 2020-09-16 MED ORDER — CIPROFLOXACIN HCL 500 MG PO TABS: 500.0000 mg | ORAL_TABLET | Freq: Every day | ORAL | 0 refills | Status: DC

## 2020-09-16 MED ORDER — SPIRONOLACTONE 50 MG PO TABS: 50.0000 mg | ORAL_TABLET | Freq: Two times a day (BID) | ORAL | 2 refills | Status: DC

## 2020-09-16 MED ORDER — RIFAXIMIN 550 MG PO TABS: 550.0000 mg | ORAL_TABLET | Freq: Two times a day (BID) | ORAL | 3 refills | Status: DC

## 2020-09-16 MED ORDER — FUROSEMIDE 20 MG PO TABS: 20.0000 mg | ORAL_TABLET | Freq: Two times a day (BID) | ORAL | 2 refills | Status: DC

## 2020-09-16 NOTE — Telephone Encounter (Signed)
Left a detailed message for pt. Pt can complete labs at [facility] or [facility]. Lab orders faxed to [facility].

## 2020-09-16 NOTE — Discharge Summary (Signed)
Alejandro Porter, is a 45 y.o. male  DOB 18-Aug-1975  MRN [id number].  Admission date:  09/14/2020  Admitting Physician  Roxan Hockey, MD  Discharge Date:  09/16/2020   Primary MD  Sofie Rower, PA-C  Recommendations for primary care physician for things to follow:   1)Avoid ibuprofen/Advil/Aleve/Motrin/Goody Powders/Naproxen/BC powders/Meloxicam/Diclofenac/Indomethacin and other Nonsteroidal anti-inflammatory medications as these will make you more likely to bleed and can cause stomach ulcers, can also cause Kidney problems.   2)Follow up your Gastroenterologist within 1 week for recheck,   3)Repeat CBC , PT/INR and CMP blood test next week with your Gastroenterologist  4)Start Doxycycline antibiotic 100 mg twice daily for 5 days for belly infection, then take cipro 535m daily starting on Tuesday 09/21/20 for prevention of further belly fluid infection  Admission Diagnosis  SBP (spontaneous bacterial [place]itonitis) ([place] [K65.2]   Discharge Diagnosis  SBP (spontaneous bacterial peritonitis) ([place] [K65.2]   Principal Problem:   Staph Auricularis SBP (spontaneous [place]itis) ([place] Active Problems:   Cirrhosis of liver with ascites (HCC)   Hepatic cirrhosis (HCC)   Portal hypertension (HCC)   Portal vein thrombosis   Hyponatremia   GERD (gastroesophageal reflux disease)      Past Medical History:  Diagnosis Date  . H/O colonoscopy   . H/O endoscopy   . Hypertension   . [place] cirrhosis [place]   . Neuropathy   . S/P abdominal paracentesis     Past Surgical History:  Procedure Laterality Date  . BIOPSY  09/12/2019   Procedure: BIOPSY;  Surgeon: RDaneil Dolin MD;  [facility];  Service: Endoscopy;;  . COLONOSCOPY N/A 09/18/2019   Procedure: COLONOSCOPY;  Surgeon: FDanie Binder MD;  [facility];  Service: Endoscopy;  Laterality: N/A;  . ESOPHAGEAL  BANDING N/A 09/12/2019   Procedure: ESOPHAGEAL BANDING;  Surgeon: RDaneil Dolin MD;  [facility];  Service: Endoscopy;  Laterality: N/A;  . ESOPHAGOGASTRODUODENOSCOPY N/A 09/12/2019   Procedure: ESOPHAGOGASTRODUODENOSCOPY (EGD);  Surgeon: RDaneil Dolin MD;  [facility];  Service: Endoscopy;  Laterality: N/A;  . POLYPECTOMY  09/18/2019   Procedure: POLYPECTOMY;  Surgeon: FDanie Binder MD;  [facility];  Service: Endoscopy;;       HPI  from the history and physical done on the day of admission:     Alejandro Porter is a 45y.o. male, with history of non alcoholic liver cirrhosis, HTN, neuropathy and more presents to the ED with a chief complaint of infection in his belly.  Patient reports that he had paracentesis on April 7, and was called today and told to come into the ER.  He reports that he has belly pain. It is like his normal belly pain, but not as severe.  He reports the pain is diffuse throughout his abdomen, and feels like a pressure.  He reports that he also has an inguinal hernia, so when his ascites builds up, the pain is also worsened his hernia.  He takes gabapentin  as needed and reports that it helps with the pain.  The most improvement he gets in the pain is when he has a paracentesis.  He believes his dry weight to be 219 pounds, but reports that his weight today was 227 pounds.  He reports he usually gains 15 to 20 pounds between paracenteses.  He gets his belly tapped every 2 weeks.  He reports he has had no fevers, no nausea or vomiting, no loss of appetite.  Last normal meal was this morning, his last normal bowel movement was this morning.  He rates his pain today as a 2 out of 10, with a 10 out of 10 being the day he gets his paracentesis.  He reports that he sometimes has chest pain but it is also from the pressure in his abdomen when his ascites builds up.  He has an associated cough when his ascites builds up as well.  None of the things are  happening right now.  On chart review it was reported that patient's kidney function had bumped to nearly 2.0 from his baseline of 1.3.  RAD creatinine is 1.38.  Provider was concerned that with his cirrhosis, SBP, elevated kidney function he would be at an increased risk for poor outcome, so was advised to go to the ED for IV antibiotics, and IV albumin treatment.  I spoke to GI who recommended 2 g Rocephin daily, and albumin 25 g IV every 6 hours x4 today, and then again on day 3.  Reports that they will see him in the a.m.  Paracentesis done on April 7 yielded 5 L of fluid, that was hazy, straw-colored, with 57 neutrophils.  Grew staph auricularis, sensitive to all antibiotics except for oxacillin.  Patient does not smoke, does not drink alcohol, does not use illicit drugs.  He was not vaccinated for COVID.  Patient is full code.  In the ED Afebrile, heart rate 62, respiratory rate 18, blood pressure 107/70, satting at 99% No leukocytosis with a white blood cell count of 6.0, hemoglobin 13.0 Hyponatremia with a sodium of 128, creatinine near to baseline at 1.38, glucose 100 Respiratory panel pending Blood culture pending    Hospital Course:   Brief Summary:- 45 y.o.male,with history of non alcoholic liver cirrhosis with esophageal varices and recurrent ascites, as well as portal vein thrombosis, HTN, neuropathy admitted on 09/14/2020 with staph auricularis SBP   A/p 1)Staph Auricularis SBP (spontaneous bacterial peritonitis)--- switched from IV Rocephin to IV vancomycin --Patient had albumin infusion last dose 09/16/2020 -Discussed with GI service -Discussed with on-call ID physician Dr. 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------ with recurrent ascites Viral hepatitis profile negative, patient denies significant EtOH use -s/p palliative paracentesis on 09/09/2020 -SBP as above #1 -Continue Lasix, Aldactone ,  lactulose and rifaximin -GI recommends discontinue propanolol due to worse outcomes and SBP patient's  3)Portal vein thrombosis, nonocclusive, extending into right intrahepatic portal vein, first noted at time of CT 08/16/20--- c/n apixaban  4)GERD-continue PPI  5) chronic anemia/thrombocytopenia--hemoglobin currently 11 which is close to baseline, monitor closely--anemia and thrombocytopenia most likely related to underlying liver cirrhosis -No bleeding concerns  6) chronic hyponatremia----due to liver cirrhosis, avoid excessive free water  Disposition--Home with  family  Disposition: The patient is from: Home  Anticipated d/c is to: Home   Code Status : -  Code Status: Full Code   Family Communication:    NA (patient is alert, awake and coherent)   Consults  :  Gi  DVT Prophylaxis  :   - SCDs   SCDs Start: 09/14/20 1920 apixaban (ELIQUIS) tablet 5 mg   Discharge Condition: Stable  Follow UP   Follow-up Information    Mahala Menghini, PA-C. Schedule an appointment as soon as possible for a visit in 1 week(s).   Specialty: Gastroenterology Why: Repeat CBC , PT/INR and CMP blood test next week with your Gastroenterologist  Contact information: [address] [place] [address] [phone]              Diet and Activity recommendation:  As advised  Discharge Instructions    Discharge Instructions    Call MD for:  difficulty breathing, headache or visual disturbances   Complete by: As directed    Call MD for:  persistant dizziness or light-headedness   Complete by: As directed    Call MD for:  persistant nausea and vomiting   Complete by: As  directed    Call MD for:  temperature >100.4   Complete by: As directed    Diet - low sodium heart healthy   Complete by: As directed    Discharge instructions   Complete by: As directed    1)Avoid ibuprofen/Advil/Aleve/Motrin/Goody Powders/Naproxen/BC powders/Meloxicam/Diclofenac/Indomethacin and other Nonsteroidal anti-inflammatory medications as these will make you more likely to bleed and can cause stomach ulcers, can also cause Kidney problems.   2)Follow up your Gastroenterologist within 1 week for recheck,   3)Repeat CBC , PT/INR and CMP blood test next week with your Gastroenterologist  4)Start Doxycycline antibiotic 100 mg twice daily for 5 days for belly infection, then take cipro 566m daily starting on Tuesday 09/21/20 for prevention of further belly fluid infection   Increase activity slowly   Complete by: As directed        Discharge Medications     Allergies as of 09/16/2020      Reactions   Chlorthalidone    Metoprolol       Medication List    STOP taking these medications   HYDROcodone-acetaminophen 5-325 MG tablet Commonly known as: NORCO/VICODIN   propranolol 20 MG tablet Commonly known as: INDERAL   Zinc Chelated 50 MG Tabs     TAKE these medications   apixaban 5 MG Tabs tablet Commonly known as: ELIQUIS Take 1 tablet (5 mg total) by mouth 2 (two) times daily.   ciprofloxacin 500 MG tablet Commonly known as: CIPRO Take 1 tablet (500 mg total) by mouth daily. To prevent belly fluid infection Start taking on: September 21, 2020   doxycycline 100 MG tablet Commonly known as: VIBRA-TABS Take 1 tablet (100 mg total) by mouth 2 (two) times daily for 5 days.   furosemide 20 MG tablet Commonly known as: LASIX Take 1 tablet (20 mg total) by mouth 2 (two) times daily. What changed:   medication strength  how much to take   gabapentin 300 MG capsule Commonly known as: NEURONTIN Take 1 capsule (300 mg total) by mouth at bedtime.   Iron 325 (65  Fe) MG Tabs Take 1 tablet (325 mg total) by mouth daily.   lactulose 10 GM/15ML solution Commonly known as: CHRONULAC Take 15 mLs (10 g total) by mouth daily. What changed:   how much  to take  when to take this   levOCARNitine 330 MG tablet Commonly known as: CARNITOR Take 1 tablet by mouth 3 (three) times daily.   multivitamin tablet Take 1 tablet by mouth daily.   omeprazole 20 MG capsule Commonly known as: PriLOSEC Take 1 capsule (20 mg total) by mouth daily.   rifaximin 550 MG Tabs tablet Commonly known as: Xifaxan Take 1 tablet (550 mg total) by mouth 2 (two) times daily. What changed:   how much to take  Another medication with the same name was removed. Continue taking this medication, and follow the directions you see here.   spironolactone 50 MG tablet Commonly known as: ALDACTONE Take 1 tablet (50 mg total) by mouth 2 (two) times daily with a meal. What changed:   medication strength  how much to take   Vitamin D3 125 MCG (5000 UT) Tabs Take 1 tablet by mouth daily.       Major procedures and Radiology Reports - PLEASE review detailed and final reports for all details, in brief -   CT Abdomen Pelvis W Contrast  Result Date: 08/24/2020 CLINICAL DATA:  Epigastric pain Abdominal pain, acute, nonlocalized Portal venous thrombus, cirrhosis, hepatoma screening EXAM: CT ABDOMEN AND PELVIS WITH CONTRAST TECHNIQUE: Multidetector CT imaging of the abdomen and pelvis was performed using the standard protocol following bolus administration of intravenous contrast. CONTRAST:  178m OMNIPAQUE IOHEXOL 300 MG/ML  SOLN COMPARISON:  CT August 17, 2019 FINDINGS: Lower chest: There are clustered tree and bud nodules in the dependent portions of the left greater than right bases measuring up to 2.5 cm on image 5/2 which are intimately associated with bronchi/bronchioles, these are most likely infectious or inflammatory possibly related to aspiration, recommend clinical and  possibly bronchoscopic evaluation in this patient which is immunocompromised 2/2 hepatic dysfunction. No pleural effusion. Hepatobiliary: Cirrhotic hepatic morphology. No arterially enhancing hepatic lesions. Tiny layering cholelithiasis without gallbladder distension. There is mild gallbladder wall thickening likely related to hepatic dysfunction. No biliary ductal dilation. Pancreas: Unremarkable. No pancreatic ductal dilatation or surrounding inflammatory changes. Spleen: Splenomegaly measuring up to 19.1 cm in maximum axial dimension. 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 Right adrenal glands unremarkable. No hydronephrosis, nephrolithiasis, or suspicious renal masses. Urinary bladder is grossly unremarkable for degree of distension. Stomach/Bowel: Small hiatal hernia otherwise the stomach is grossly unremarkable. No evidence of small-bowel obstruction. No suspicious colonic wall thickening or mass like lesions. Left-sided colonic diverticulosis without findings of acute diverticulitis. Vascular/Lymphatic: Scattered aortic atherosclerosis without aneurysmal dilation. The portal vein is patent with decreased burden of nonocclusive chronic thin laminar thrombus running along the wall the portal vein extending into the right portal vein on image 52/10. The hepatic, superior mesenteric and splenic veins are patent without filling defects. Conventional hepatic arterial anatomy. Abdominopelvic collateral vessels. Again seen are prominent periportal and celiac axis lymph nodes measuring up to 13 mm on image 33/7, likely reactive. Reproductive: Prostate is unremarkable. Other: Large volume abdominopelvic ascites. Small fluid containing ventral hernia. Fat and fluid containing right inguinal hernia. Musculoskeletal: No acute osseous abnormality. Similar appearance of the superior endplate compression  deformities at L1-L2. IMPRESSION: 1. Cirrhosis with sequelae of portal hypertension including large volume abdominopelvic ascites, splenomegaly, and abdominopelvic collateral vessels. No arterially enhancing hepatic lesions. 2. Decreased burden of nonocclusive chronic thin laminar thrombus running along the wall of the portal vein and extending into the right portal vein  3. Clustered tree and bud nodules in the dependent portions of the left greater than right lung bases measuring, most likely infectious or inflammatory in nature and possibly related to aspiration, recommend clinical correlation and possibly bronchoscopic evaluation in this patient which is immunocompromised 2/2 hepatic dysfunction. 4. Unchanged prominent upper abdominal lymph nodes which are likely reactive. 5. 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 6. Small fluid containing ventral hernia and right inguinal hernia. 7. Aortic atherosclerosis. Aortic Atherosclerosis (ICD10-I70.0). Electronically Signed   By: Dahlia Bailiff MD   On: 08/24/2020 13:52   US Paracentesis  Result Date: 09/09/2020 INDICATION: Cirrhosis, ascites EXAM: ULTRASOUND GUIDED DIAGNOSTIC AND THERAPEUTIC PARACENTESIS MEDICATIONS: None COMPLICATIONS: None immediate PROCEDURE: Informed written consent was obtained from the patient after a discussion of the risks, benefits and alternatives to treatment. A timeout was performed prior to the initiation of the procedure. Initial ultrasound scanning demonstrates a large amount of ascites within the right lower abdominal quadrant. The right lower abdomen was prepped and draped in the usual sterile fashion. 1% lidocaine was used for local anesthesia. Following this, a 5 [place] Yueh catheter was introduced. An ultrasound image was saved for documentation purposes. The paracentesis was performed. The catheter was removed and a dressing was applied. The  patient tolerated the procedure well without immediate post procedural complication. Patient received post-procedure intravenous albumin; see nursing notes for details. FINDINGS: A total of approximately 5 L of yellow ascitic fluid was removed. Samples were sent to the laboratory as requested by the clinical team. IMPRESSION: Successful ultrasound-guided paracentesis yielding 5 liters of peritoneal fluid. Electronically Signed   By: Lavonia Dana M.D.   On: 09/09/2020 12:32   US Paracentesis  Result Date: 08/18/2020 INDICATION: Cirrhosis, ascites EXAM: ULTRASOUND GUIDED DIAGNOSTIC AND THERAPEUTIC PARACENTESIS MEDICATIONS: None COMPLICATIONS: None immediate PROCEDURE: Informed written consent was obtained from the patient after a discussion of the risks, benefits and alternatives to treatment. A timeout was performed prior to the initiation of the procedure. Initial ultrasound scanning demonstrates a large amount of ascites within the right lower abdominal quadrant. The right lower abdomen was prepped and draped in the usual sterile fashion. 1% lidocaine was used for local anesthesia. Following this, a 5 [place] Yueh catheter was introduced. An ultrasound image was saved for documentation purposes. The paracentesis was performed. The catheter was removed and a dressing was applied. The patient tolerated the procedure well without immediate post procedural complication. Patient received post-procedure intravenous albumin; see nursing notes for details. FINDINGS: A total of approximately 6.7 L of yellow ascitic fluid was removed. Samples were sent to the laboratory as requested by the clinical team. IMPRESSION: Successful ultrasound-guided paracentesis yielding 6.7 liters of peritoneal fluid. Electronically Signed   By: Lavonia Dana M.D.   On: 08/18/2020 11:00   Micro Results  Recent Results (from the past 240 hour(s))  Culture, body fluid w Gram Stain-bottle     Status: Abnormal   Collection Time: 09/09/20 11:44  AM   Specimen: Peritoneal Washings  Result Value Ref Range Status   Specimen Description   Final    PERITONEAL 10 CC Performed at [facility], [address] [place], [place] [address]    Special Requests   Final    BOTTLES DRAWN AEROBIC AND ANAEROBIC Performed at [facility], [address] [place], [place] [address]    Gram Stain   Final    GRAM POSITIVE COCCI AEROBIC BOTTLE ONLY  Gram Stain Report Called to,Read Back By and Verified With: DR. Abbey Chatters @1715  BY MATTHEWS,B 4.9.22 Performed at [facility], [place] [address], [place], [place] [address]    Culture STAPHYLOCOCCUS AURICULARIS (A)  Final   Report Status 09/14/2020 FINAL  Final   Organism ID, Bacteria STAPHYLOCOCCUS AURICULARIS  Final      Susceptibility   Staphylococcus auricularis - MIC*    CIPROFLOXACIN <=0.5 SENSITIVE Sensitive     ERYTHROMYCIN 0.5 SENSITIVE Sensitive     GENTAMICIN <=0.5 SENSITIVE Sensitive     OXACILLIN 0.5 RESISTANT Resistant     TETRACYCLINE <=1 SENSITIVE Sensitive     VANCOMYCIN 1 SENSITIVE Sensitive     TRIMETH/SULFA <=10 SENSITIVE Sensitive     CLINDAMYCIN <=0.25 SENSITIVE Sensitive     RIFAMPIN <=0.5 SENSITIVE Sensitive     Inducible Clindamycin NEGATIVE Sensitive     * STAPHYLOCOCCUS AURICULARIS  Gram stain     Status: None   Collection Time: 09/09/20 11:44 AM   Specimen: Peritoneal Washings  Result Value Ref Range Status   Specimen Description PERITONEAL  Final   Special Requests NONE  Final   Gram Stain   Final    NO ORGANISMS SEEN WBC PRESENT, PREDOMINANTLY MONONUCLEAR Performed at [facility], [address] [place], [place] [address]    Report Status 09/09/2020 FINAL  Final  Resp Panel by RT-PCR (Flu A&B, Covid) Nasopharyngeal Swab     Status: None   Collection Time: 09/14/20  4:10 PM   Specimen: Nasopharyngeal Swab; Nasopharyngeal(NP) swabs in vial transport medium  Result Value Ref Range Status   SARS Coronavirus 2 by RT PCR NEGATIVE NEGATIVE Final    Comment:  (NOTE) SARS-CoV-2 target nucleic acids are NOT DETECTED.  The SARS-CoV-2 RNA is generally detectable in upper respiratory specimens during the acute phase of infection. The lowest concentration of SARS-CoV-2 viral copies this assay can detect is 138 copies/mL. A negative result does not preclude SARS-Cov-2 infection and should not be used as the sole basis for treatment or other patient management decisions. A negative result may occur with  improper specimen collection/handling, submission of specimen other than nasopharyngeal swab, presence of viral mutation(s) within the areas targeted by this assay, and inadequate number of viral copies(<138 copies/mL). A negative result must be combined with clinical observations, patient history, and epidemiological information. The expected result is Negative.  Fact Sheet for Patients:  EntrepreneurPulse.com.au  Fact Sheet for Healthcare Providers:  IncredibleEmployment.be  This test is no t yet approved or cleared by the [place] FDA and  has been authorized for detection and/or diagnosis of SARS-CoV-2 by FDA under an Emergency Use Authorization (EUA). This EUA will remain  in effect (meaning this test can be used) for the duration of the COVID-19 declaration under Section 564(b)(1) of the Act, 21 U.S.C.section 360bbb-3(b)(1), unless the authorization is terminated  or revoked sooner.       Influenza A by PCR NEGATIVE NEGATIVE Final   Influenza B by PCR NEGATIVE NEGATIVE Final    Comment: (NOTE) The Xpert Xpress SARS-CoV-2/FLU/RSV plus assay is intended as an aid in the diagnosis of influenza from Nasopharyngeal swab specimens and should not be used as a sole basis for treatment. Nasal washings and aspirates are unacceptable for Xpert Xpress SARS-CoV-2/FLU/RSV testing.  Fact Sheet for Patients: EntrepreneurPulse.com.au  Fact Sheet for Healthcare  Providers: IncredibleEmployment.be  This test is not yet approved or cleared by the [place] FDA and has been authorized for detection and/or diagnosis of SARS-CoV-2 by  FDA under an Emergency Use Authorization (EUA). This EUA will remain in effect (meaning this test can be used) for the duration of the COVID-19 declaration under Section 564(b)(1) of the Act, 21 U.S.C. section 360bbb-3(b)(1), unless the authorization is terminated or revoked.  Performed at [facility], [address] [place], [place] [address]   Blood culture (routine x 2)     Status: None (Preliminary result)   Collection Time: 09/14/20  4:11 PM   Specimen: BLOOD  Result Value Ref Range Status   Specimen Description BLOOD LEFT ANTECUBITAL  Final   Special Requests   Final    BOTTLES DRAWN AEROBIC AND ANAEROBIC Blood Culture adequate volume   Culture   Final    NO GROWTH 2 DAYS Performed at [facility], [address] [place], [place] [address]    Report Status PENDING  Incomplete  Blood culture (routine x 2)     Status: None (Preliminary result)   Collection Time: 09/14/20  5:16 PM   Specimen: BLOOD  Result Value Ref Range Status   Specimen Description BLOOD RIGHT ANTECUBITAL  Final   Special Requests   Final    Blood Culture adequate volume BOTTLES DRAWN AEROBIC AND ANAEROBIC   Culture   Final    NO GROWTH 2 DAYS Performed at [facility], [address] [place], [place] [address]    Report Status PENDING  Incomplete    Today   Subjective    Alejandro Porter today has no new concerns -Eating and drinking well -Had soft BM -Denies abdominal pain No fever  Or chills  No nausea or vomiting   Patient has been seen and examined prior to discharge   Objective   Blood pressure 106/80, pulse 70, temperature 97.9 F (36.6 C), resp. rate 20, height 5' 8"  (1.727 m), weight 101.4 kg, SpO2 99 %.   Intake/Output Summary (Last 24 hours) at 09/16/2020 1314 Last data filed at  09/16/2020 0900 Gross per 24 hour  Intake 1384.69 ml  Output 500 ml  Net 884.69 ml    Exam Gen:- Awake Alert, no acute distress  HEENT:- [place].AT, No sclera icterus Neck-Supple Neck,No JVD,.  Lungs-  CTAB , good air movement bilaterally  CV- S1, S2 normal, regular Abd-  +ve B.Sounds, Abd Soft, No significant tenderness,    Extremity/Skin:-   good pulses Psych-affect is appropriate, oriented x3 Neuro-no new focal deficits, no tremors    Data Review   CBC w Diff:  Lab Results  Component Value Date   WBC 4.6 09/15/2020   HGB 11.1 (L) 09/15/2020   HCT 34.5 (L) 09/15/2020   PLT 112 (L) 09/15/2020   LYMPHOPCT 8 09/14/2020   MONOPCT 11 09/14/2020   EOSPCT 3 09/14/2020   BASOPCT 1 09/14/2020    CMP:  Lab Results  Component Value Date   NA 129 (L) 09/16/2020   K 4.9 09/16/2020   CL 96 (L) 09/16/2020   CO2 24 09/16/2020   BUN 30 (H) 09/16/2020   CREATININE 1.46 (H) 09/16/2020   PROT 6.7 09/15/2020   ALBUMIN 3.4 (L) 09/15/2020   BILITOT 0.8 09/15/2020   ALKPHOS 74 09/15/2020   AST 20 09/15/2020   ALT 14 09/15/2020  .   Total Discharge time is about 33 minutes  Roxan Hockey M.D on 09/16/2020 at 1:14 PM  Go to [email or web address] -  for contact info  [facility] - Office  [phone]

## 2020-09-16 NOTE — Progress Notes (Addendum)
Subjective:  Patient states his abdominal pain is better. Received some pain medication overnight but improved this morning. Notes his umbilical hernia is more painful the more ascites he has. Currently stomach flat and he does not feel he has much accumulation of fluid.  Appetite good.  Has not had a bowel movement in 24 hours.  States he has 0-2 stools at home and often strains.  Has been reluctant to increase lactulose dose because it "makes me sick".  Currently at home is taking a half of a 1-2 times daily.  Objective: Vital signs in last 24 hours: Temp:  [97.9 F (36.6 C)-99.1 F (37.3 C)] 97.9 F (36.6 C) (04/14 0551) Pulse Rate:  [62-70] 70 (04/14 0551) Resp:  [17-20] 20 (04/14 0551) BP: (101-107)/(76-84) 106/80 (04/14 0551) SpO2:  [98 %-99 %] 99 % (04/14 0551) Last BM Date: 09/13/20 General:   Alert, chronically ill-appearing male, pleasant and cooperative in NAD Head:  Normocephalic and atraumatic. Eyes:  Sclera clear, no icterus.  Abdomen:  Soft, nondistended.  Tenderness noted at umbilical hernia, easily reducible. Normal bowel sounds, without guarding, and without rebound.   Extremities:  Without clubbing, deformity.  Trace bilateral pedal edema. Neurologic:  Alert and  oriented x4;  grossly normal neurologically.  Tremor of hands with movement but none at rest.  Patient reports having these for years. Skin:  Intact without significant lesions or rashes. Psych:  Alert and cooperative. Normal mood and affect.  Intake/Output from previous day: 04/13 0701 - 04/14 0700 In: 1624.7 [P.O.:960; IV Piggyback:664.7] Out: 500 [Urine:500] Intake/Output this shift: Total I/O In: 240 [P.O.:240] Out: -   Lab Results: CBC Recent Labs    09/14/20 1611 09/15/20 0434  WBC 6.0 4.6  HGB 13.0 11.1*  HCT 38.2* 34.5*  MCV 91.6 93.0  PLT 165 112*   BMET Recent Labs    09/14/20 1611 09/15/20 0434  NA 128* 132*  K 4.5 4.8  CL 95* 98  CO2 26 24  GLUCOSE 100* 77  BUN 31* 27*   CREATININE 1.38* 1.23  CALCIUM 8.6* 8.9   LFTs Recent Labs    09/14/20 1611 09/15/20 0434  BILITOT 0.6 0.8  ALKPHOS 80 74  AST 21 20  ALT 16 14  PROT 7.3 6.7  ALBUMIN 3.5 3.4*   No results for input(s): LIPASE in the last 72 hours. PT/INR Recent Labs    09/14/20 1611  LABPROT 15.5*  INR 1.2      Imaging Studies: CT Abdomen Pelvis W Contrast  Result Date: 08/24/2020 CLINICAL DATA:  Epigastric pain Abdominal pain, acute, nonlocalized Portal venous thrombus, cirrhosis, hepatoma screening EXAM: CT ABDOMEN AND PELVIS WITH CONTRAST TECHNIQUE: Multidetector CT imaging of the abdomen and pelvis was performed using the standard protocol following bolus administration of intravenous contrast. CONTRAST:  129m OMNIPAQUE IOHEXOL 300 MG/ML  SOLN COMPARISON:  CT August 17, 2019 FINDINGS: Lower chest: There are clustered tree and bud nodules in the dependent portions of the left greater than right bases measuring up to 2.5 cm on image 5/2 which are intimately associated with bronchi/bronchioles, these are most likely infectious or inflammatory possibly related to aspiration, recommend clinical and possibly bronchoscopic evaluation in this patient which is immunocompromised 2/2 hepatic dysfunction. No pleural effusion. Hepatobiliary: Cirrhotic hepatic morphology. No arterially enhancing hepatic lesions. Tiny layering cholelithiasis without gallbladder distension. There is mild gallbladder wall thickening likely related to hepatic dysfunction. No biliary ductal dilation. Pancreas: Unremarkable. No pancreatic ductal dilatation or surrounding inflammatory changes. Spleen: Splenomegaly measuring up  to 19.1 cm in maximum axial dimension. 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 Right adrenal glands unremarkable. No hydronephrosis, nephrolithiasis, or suspicious renal masses. Urinary bladder is grossly  unremarkable for degree of distension. Stomach/Bowel: Small hiatal hernia otherwise the stomach is grossly unremarkable. No evidence of small-bowel obstruction. No suspicious colonic wall thickening or mass like lesions. Left-sided colonic diverticulosis without findings of acute diverticulitis. Vascular/Lymphatic: Scattered aortic atherosclerosis without aneurysmal dilation. The portal vein is patent with decreased burden of nonocclusive chronic thin laminar thrombus running along the wall the portal vein extending into the right portal vein on image 52/10. The hepatic, superior mesenteric and splenic veins are patent without filling defects. Conventional hepatic arterial anatomy. Abdominopelvic collateral vessels. Again seen are prominent periportal and celiac axis lymph nodes measuring up to 13 mm on image 33/7, likely reactive. Reproductive: Prostate is unremarkable. Other: Large volume abdominopelvic ascites. Small fluid containing ventral hernia. Fat and fluid containing right inguinal hernia. Musculoskeletal: No acute osseous abnormality. Similar appearance of the superior endplate compression deformities at L1-L2. IMPRESSION: 1. Cirrhosis with sequelae of portal hypertension including large volume abdominopelvic ascites, splenomegaly, and abdominopelvic collateral vessels. No arterially enhancing hepatic lesions. 2. Decreased burden of nonocclusive chronic thin laminar thrombus running along the wall of the portal vein and extending into the right portal vein 3. Clustered tree and bud nodules in the dependent portions of the left greater than right lung bases measuring, most likely infectious or inflammatory in nature and possibly related to aspiration, recommend clinical correlation and possibly bronchoscopic evaluation in this patient which is immunocompromised 2/2 hepatic dysfunction. 4. Unchanged prominent upper abdominal lymph nodes which are likely reactive. 5. Unchanged size of the 3.5 cm left adrenal  mass which contains macroscopic foci of internal fat consistent with a myelolipoma. 6. Small fluid containing ventral hernia and right inguinal hernia. 7. Aortic atherosclerosis. Aortic Atherosclerosis (ICD10-I70.0). Electronically Signed   By: Dahlia Bailiff MD   On: 08/24/2020 13:52   US Paracentesis  Result Date: 09/09/2020 INDICATION: Cirrhosis, ascites EXAM: ULTRASOUND GUIDED DIAGNOSTIC AND THERAPEUTIC PARACENTESIS MEDICATIONS: None COMPLICATIONS: None immediate PROCEDURE: Informed written consent was obtained from the patient after a discussion of the risks, benefits and alternatives to treatment. A timeout was performed prior to the initiation of the procedure. Initial ultrasound scanning demonstrates a large amount of ascites within the right lower abdominal quadrant. The right lower abdomen was prepped and draped in the usual sterile fashion. 1% lidocaine was used for local anesthesia. Following this, a 5 [place] Yueh catheter was introduced. An ultrasound image was saved for documentation purposes. The paracentesis was performed. The catheter was removed and a dressing was applied. The patient tolerated the procedure well without immediate post procedural complication. Patient received post-procedure intravenous albumin; see nursing notes for details. FINDINGS: A total of approximately 5 L of yellow ascitic fluid was removed. Samples were sent to the laboratory as requested by the clinical team. IMPRESSION: Successful ultrasound-guided paracentesis yielding 5 liters of peritoneal fluid. Electronically Signed   By: Lavonia Dana M.D.   On: 09/09/2020 12:32   US Paracentesis  Result Date: 08/18/2020 INDICATION: Cirrhosis, ascites EXAM: ULTRASOUND GUIDED DIAGNOSTIC AND THERAPEUTIC PARACENTESIS MEDICATIONS: None COMPLICATIONS: None immediate PROCEDURE: Informed written consent was obtained from the patient after a discussion of the risks, benefits and alternatives to treatment. A timeout was performed  prior to the initiation of the procedure. Initial ultrasound scanning demonstrates a  large amount of ascites within the right lower abdominal quadrant. The right lower abdomen was prepped and draped in the usual sterile fashion. 1% lidocaine was used for local anesthesia. Following this, a 5 [place] Yueh catheter was introduced. An ultrasound image was saved for documentation purposes. The paracentesis was performed. The catheter was removed and a dressing was applied. The patient tolerated the procedure well without immediate post procedural complication. Patient received post-procedure intravenous albumin; see nursing notes for details. FINDINGS: A total of approximately 6.7 L of yellow ascitic fluid was removed. Samples were sent to the laboratory as requested by the clinical team. IMPRESSION: Successful ultrasound-guided paracentesis yielding 6.7 liters of peritoneal fluid. Electronically Signed   By: Lavonia Dana M.D.   On: 08/18/2020 11:00  [2 weeks]   Assessment:  Pleasant 45 year old male with history of cirrhosis, portal hypertension, grade 1/2 esophageal varices on NSBB, portal vein thrombosis on Eliquis, hepatic encephalopathy on lactulose and Xifaxan, thrombocytopenia, ascites requiring repeat paracentesis currently every 2 weeks, undergoing evaluation for liver transplantation through [facility]) now presenting for admission for SBP after undergoing outpatient paracentesis with cultures positive for Staphylococcus auricularis, PMNs 248.  SBP: Patient has chronic abdominal pain, typically worsens as his ascites progresses, improved after paracenteses.  Umbilical hernia also becomes more painful more ascites he has.  He had noted increased abdominal pain for a couple of days prior to admission.  Initially received meropenem and and ceftriaxone, ultimately transitioning to vancomycin per ID recommendations (consulted with by Dr. Denton Brick).  Plans for 5 days of IV antibiotic  therapy.  Thereafter he will need to be on Cipro 500 mg daily for prophylaxis.  Received dose of albumin April 12, plans for another dose on day 3.  Due to SBP, his nonselective beta-blocker will need to be discontinued permanently due to association with poor outcome. Would avoid PPI unless clear indication as may increase risk of SBP. He will also require SBP prophylaxis moving forward.  Plans for Cipro 500 mg daily.  Discussed at length with patient.  He will undergo EGD as an outpatient for esophageal variceal surveillance.  Cirrhosis: MELD Na 10.  Compliant with medications at home.  Tries to follow a 2 g sodium diet.  Has noticed some increased lower extremity edema with walking.  Increasing daily exercise at the request of liver transplant team.  History of hepatic encephalopathy: clinically without encephalopathy at this time. 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 Worse with movement. Notes when trying to sign his name or trying to use hands. Likely unrelated to his liver or due to HE. Complains of forgetfulness but no confusion. On lactulose (1/2 capful once or twice a day) and Xifaxan 572m bid. Avoids too much lactulose because it makes him feel sick. Currently BM 0-2 per day and strains.   Plan: 1. Stop nonselective beta-blocker due to associated poor outcomes in setting of SBP.  He will require surveillance EGD for esophageal varices. 2. Patient will need to start SBP prophylaxis after completing antibiotics for current SBP.  Recommend Cipro 500 mg daily.  Discussed at length with patient. 3. Continue Xifaxan 550 mg twice daily. 4. Continue Lasix and Aldactone. 5. Titrate lactulose to achieve 2-3 soft BMs daily.  Discussed at length with patient. Currently on 20 grams BID inpatient.  6. IV albumin total of 100 mg tomorrow (Day 3 dose).  7. He will not require repeat abdominal paracentesis at this time given clinical improvement.  8. Outpatient surveillance EGD will  be arrange.    Laureen Ochs. Bernarda Caffey [facility] [phone] 4/14/202210:14 AM     LOS: 1 day

## 2020-09-16 NOTE — Discharge Instructions (Signed)
1)Avoid ibuprofen/Advil/Aleve/Motrin/Goody Powders/Naproxen/BC powders/Meloxicam/Diclofenac/Indomethacin and other Nonsteroidal anti-inflammatory medications as these will make you more likely to bleed and can cause stomach ulcers, can also cause Kidney problems.   2)Follow up your Gastroenterologist within 1 week for recheck,   3)Repeat CBC , PT/INR and CMP blood test next week with your Gastroenterologist  4)Start Doxycycline antibiotic 100 mg twice daily for 5 days for belly infection, then take cipro 557m daily starting on Tuesday 09/21/20 for prevention of further belly fluid infection

## 2020-09-16 NOTE — Telephone Encounter (Signed)
Patient needs hospital follow up.  Please also plan for labs next week: cbc, cmet, pt/inr

## 2020-09-19 LAB — CULTURE, BLOOD (ROUTINE X 2)
Culture: NO GROWTH
Culture: NO GROWTH
Special Requests: ADEQUATE
Special Requests: ADEQUATE

## 2020-09-20 ENCOUNTER — Encounter: Payer: Self-pay | Admitting: Gastroenterology

## 2020-09-20 NOTE — Telephone Encounter (Signed)
noted 

## 2020-09-20 NOTE — Telephone Encounter (Signed)
Scheduled him for this Friday and called and left message on his phone

## 2020-09-24 ENCOUNTER — Encounter: Payer: Self-pay | Admitting: *Deleted

## 2020-09-24 ENCOUNTER — Ambulatory Visit: Payer: Medicaid Other | Admitting: Gastroenterology

## 2020-09-24 ENCOUNTER — Other Ambulatory Visit: Payer: Self-pay | Admitting: *Deleted

## 2020-09-24 ENCOUNTER — Other Ambulatory Visit: Payer: Self-pay

## 2020-09-24 ENCOUNTER — Encounter: Payer: Self-pay | Admitting: Gastroenterology

## 2020-09-24 ENCOUNTER — Telehealth: Payer: Self-pay | Admitting: Gastroenterology

## 2020-09-24 ENCOUNTER — Other Ambulatory Visit (HOSPITAL_COMMUNITY)
Admission: RE | Admit: 2020-09-24 | Discharge: 2020-09-24 | Disposition: A | Discharge status: Home / Self Care | Payer: Medicaid Other | Source: Ambulatory Visit | Attending: Gastroenterology | Admitting: Gastroenterology

## 2020-09-24 VITALS — BP 128/83 | HR 78 | Temp 97.1°F | Ht 68.0 in | Wt 221.2 lb

## 2020-09-24 DIAGNOSIS — K429 Umbilical hernia without obstruction or gangrene: Secondary | ICD-10-CM

## 2020-09-24 DIAGNOSIS — K7460 Unspecified cirrhosis of liver: Secondary | ICD-10-CM | POA: Diagnosis present

## 2020-09-24 DIAGNOSIS — Z7689 Persons encountering health services in other specified circumstances: Secondary | ICD-10-CM | POA: Insufficient documentation

## 2020-09-24 DIAGNOSIS — R0683 Snoring: Secondary | ICD-10-CM | POA: Insufficient documentation

## 2020-09-24 DIAGNOSIS — K652 Spontaneous bacterial peritonitis: Secondary | ICD-10-CM | POA: Insufficient documentation

## 2020-09-24 DIAGNOSIS — G4733 Obstructive sleep apnea (adult) (pediatric): Secondary | ICD-10-CM | POA: Insufficient documentation

## 2020-09-24 DIAGNOSIS — K4090 Unilateral inguinal hernia, without obstruction or gangrene, not specified as recurrent: Secondary | ICD-10-CM | POA: Insufficient documentation

## 2020-09-24 DIAGNOSIS — L989 Disorder of the skin and subcutaneous tissue, unspecified: Secondary | ICD-10-CM

## 2020-09-24 DIAGNOSIS — R188 Other ascites: Secondary | ICD-10-CM

## 2020-09-24 DIAGNOSIS — Z9989 Dependence on other enabling machines and devices: Secondary | ICD-10-CM | POA: Insufficient documentation

## 2020-09-24 LAB — COMPREHENSIVE METABOLIC PANEL
ALT: 23 U/L (ref 0–44)
AST: 28 U/L (ref 15–41)
Albumin: 3.8 g/dL (ref 3.5–5.0)
Alkaline Phosphatase: 85 U/L (ref 38–126)
Anion gap: 8 (ref 5–15)
BUN: 32 mg/dL — ABNORMAL HIGH (ref 6–20)
CO2: 24 mmol/L (ref 22–32)
Calcium: 9.3 mg/dL (ref 8.9–10.3)
Chloride: 92 mmol/L — ABNORMAL LOW (ref 98–111)
Creatinine, Ser: 1.31 mg/dL — ABNORMAL HIGH (ref 0.61–1.24)
GFR, Estimated: >60 mL/min (ref >60)
Glucose, Bld: 95 mg/dL (ref 70–99)
Potassium: 4.8 mmol/L (ref 3.5–5.1)
Sodium: 124 mmol/L — ABNORMAL LOW (ref 135–145)
Total Bilirubin: 0.6 mg/dL (ref 0.3–1.2)
Total Protein: 7.8 g/dL (ref 6.5–8.1)

## 2020-09-24 LAB — CBC WITH DIFFERENTIAL/PLATELET
Abs Immature Granulocytes: 0.02 10*3/uL (ref 0.00–0.07)
Basophils Absolute: 0 10*3/uL (ref 0.0–0.1)
Basophils Relative: 1 %
Eosinophils Absolute: 0.1 10*3/uL (ref 0.0–0.5)
Eosinophils Relative: 1 %
HCT: 35.9 % — ABNORMAL LOW (ref 39.0–52.0)
Hemoglobin: 11.7 g/dL — ABNORMAL LOW (ref 13.0–17.0)
Immature Granulocytes: 0 %
Lymphocytes Relative: 6 %
Lymphs Abs: 0.4 10*3/uL — ABNORMAL LOW (ref 0.7–4.0)
MCH: 29.5 pg (ref 26.0–34.0)
MCHC: 32.6 g/dL (ref 30.0–36.0)
MCV: 90.7 fL (ref 80.0–100.0)
Monocytes Absolute: 0.7 10*3/uL (ref 0.1–1.0)
Monocytes Relative: 11 %
Neutro Abs: 5.6 10*3/uL (ref 1.7–7.7)
Neutrophils Relative %: 81 %
Platelets: 152 10*3/uL (ref 150–400)
RBC: 3.96 MIL/uL — ABNORMAL LOW (ref 4.22–5.81)
RDW: 14.3 % (ref 11.5–15.5)
WBC: 6.9 10*3/uL (ref 4.0–10.5)
nRBC: 0 % (ref 0.0–0.2)

## 2020-09-24 LAB — PROTIME-INR
INR: 1.3 — ABNORMAL HIGH (ref 0.8–1.2)
Prothrombin Time: 16.6 seconds — ABNORMAL HIGH (ref 11.4–15.2)

## 2020-09-24 NOTE — Progress Notes (Signed)
Primary Care Physician: Sofie Rower, PA-C  Primary Gastroenterologist:  Garfield Cornea, MD   Chief Complaint  Patient presents with  . hospital f/u    Feeling better but having shortness of breath    HPI: Alejandro Porter is a 45 y.o. male here for hospital follow-up.  He was in the hospital last week with SBP.  He has a history of cirrhosis complicated by portal hypertension, esophageal varices, portal vein thrombosis on Eliquis, hepatic encephalopathy on lactulose and Xifaxan, ascites requiring repeat paracenteses.  He follows with [facility] down in [place], and is undergoing evaluation for liver transplant with appointment May 19th.  Recent SBP. Ascitic fluid cultures were positive for Staphylococcus auricularis.  ID was consulted during hospitalization.  Patient was treated with IV vancomycin. He received IV albumin and propanolol was discontinued.  He was transitioned to oral doxycycline 100 mg twice daily for 5 days.  Now on long-term Cipro 500 mg daily for SBP prophylaxis.  His nonselective beta-blocker was discontinued due to association with poor outcomes in the setting of SBP.  He will need a surveillance EGD for esophageal varices.  Patient states that he is clinically doing much better.  He denies any abdominal pain but does have tenderness at site of umbilical hernia.  He notes that he has a right inguinal hernia as well.  Both bother him quite a bit when he is trying to exercise.  He has been trying to increase daily activity in preparation for possible liver transplant.  Patient writes down everything that he eats, calculates his daily sodium intake.  He is keeping track of his daily steps.  Compliant with all of his medications.  He has increased his lactulose and having 2-3 soft stools daily.  Denies melena or rectal bleeding.  Appetite is good.  He is concerned about his sleep habits.  For the past 1 year he has had increasing difficulty sleeping.   Tries not to nap during the day, tries to stay up to at least 11 PM.  Some nights only getting 2 to 3 hours of sleep.  He notes that if he has pain, he takes Neurontin at bedtime and on those nights he sleeps well.  If he does not have pain, he does not take the Neurontin in those nights he tends to have limited sleep.  Has trouble falling to sleep.  Sometimes when he is asleep he wakes up with his heart racing, short of breath.  Lives with his father, father states patient snores quite a bit.  EGD April 2021: -Small hiatal hernia. Mild erosive reflux esophagitis. Schatzki's ring - status post dilation. -Small grade 1/grade 2 esophageal varices without bleeding stigmata -Portal hypertensive gastropathy. Gastric erosions?status post biopsy. Single gastric polyp?status post biopsy/removal -clipped x1 -Normal duodenal bulb and second portion of the duodenum. -Hyperplastic stomach polyp, chronic inactive gastritis, no H. pylori.  Colonoscopy April 2021: -One 2 mm polyp in the cecum, removed with a cold biopsy forceps. Resected and retrieved. -One 3 mm polyp in the cecum, removed with a cold snare. Resected and retrieved. -Ten 4 to 10 mm polyps in the rectum, in the sigmoid colon, in the descending colon, in the transverse colon, at the hepatic flexure, in the ascending colon and in the cecum, removed with a hot snare. Resected and retrieved. -Diverticulosis in the recto-sigmoid colon, in the sigmoid colon and in the descending colon. -External and internal hemorrhoids. -MODERATELY TORTUOUS LEFT colon. -12 COLON POLYPS REMOVED -Multiple  simple adenomas and inflammatory polyps -Repeat colonoscopy in 3 years.  Current Outpatient Medications  Medication Sig Dispense Refill  . apixaban (ELIQUIS) 5 MG TABS tablet Take 1 tablet (5 mg total) by mouth 2 (two) times daily. 60 tablet 5  . Cholecalciferol (VITAMIN D3) 125 MCG (5000 UT) TABS Take 1 tablet by mouth daily.    . ciprofloxacin (CIPRO) 500 MG  tablet Take 1 tablet (500 mg total) by mouth daily. To prevent belly fluid infection 20 tablet 0  . Ferrous Sulfate (IRON) 325 (65 Fe) MG TABS Take 1 tablet (325 mg total) by mouth daily. 30 tablet 0  . furosemide (LASIX) 20 MG tablet Take 1 tablet (20 mg total) by mouth 2 (two) times daily. 60 tablet 2  . gabapentin (NEURONTIN) 300 MG capsule Take 1 capsule (300 mg total) by mouth at bedtime. 30 capsule 6  . lactulose (CHRONULAC) 10 GM/15ML solution Take 15 mLs (10 g total) by mouth daily. (Patient taking differently: Take 20 g by mouth 2 (two) times daily.) 946 mL 5  . levOCARNitine (CARNITOR) 330 MG tablet Take 1 tablet by mouth 3 (three) times daily.    . Multiple Vitamin (MULTIVITAMIN) tablet Take 1 tablet by mouth daily.    Marland Kitchen omeprazole (PRILOSEC) 20 MG capsule Take 1 capsule (20 mg total) by mouth daily. 30 capsule 5  . rifaximin (XIFAXAN) 550 MG TABS tablet Take 1 tablet (550 mg total) by mouth 2 (two) times daily. 60 tablet 3  . spironolactone (ALDACTONE) 50 MG tablet Take 1 tablet (50 mg total) by mouth 2 (two) times daily with a meal. 60 tablet 2   No current facility-administered medications for this visit.    Allergies as of 09/24/2020 - Review Complete 09/24/2020  Allergen Reaction Noted  . Chlorthalidone  11/04/2019  . Metoprolol  11/04/2019   Past Medical History:  Diagnosis Date  . H/O colonoscopy   . H/O endoscopy   . Hypertension   . Liver cirrhosis ([place])   . Neuropathy   . S/P abdominal paracentesis    Past Surgical History:  Procedure Laterality Date  . BIOPSY  09/12/2019   Procedure: BIOPSY;  Surgeon: Daneil Dolin, MD;  Location: [facility];  Service: Endoscopy;;  . COLONOSCOPY N/A 09/18/2019   Dr. Oneida Alar: 12 colon polyps removed, multiple simple adenomas and some inflammatory polyps, diverticulosis, external and internal hemorrhoids.  Next colonoscopy in 3 years.  . ESOPHAGEAL BANDING N/A 09/12/2019   Procedure: ESOPHAGEAL BANDING;  Surgeon: Daneil Dolin,  MD;  Location: [facility];  Service: Endoscopy;  Laterality: N/A;  . ESOPHAGOGASTRODUODENOSCOPY N/A 09/12/2019   Dr. Gala Romney: Mild erosive reflux esophagitis.  Schatzki ring status post dilation.  Small grade 1/grade 2 esophageal varices, portal hypertensive gastropathy, hyperplastic gastric polyp, gastric erosions with chronic inactive gastritis on biopsies, no H. pylori.  Marland Kitchen POLYPECTOMY  09/18/2019   Procedure: POLYPECTOMY;  Surgeon: Danie Binder, MD;  Location: [facility];  Service: Endoscopy;;   Family History  Problem Relation Age of Onset  . Brain cancer Mother   . Diabetes Mother   . Diabetes Father   . Pulmonary embolism Brother   . Liver disease Neg Hx    Social History   Tobacco Use  . Smoking status: Never Smoker  . Smokeless tobacco: Never Used  Vaping Use  . Vaping Use: Never used  Substance Use Topics  . Alcohol use: Never  . Drug use: Never    ROS:  General: Negative for anorexia,  weight loss, fever, chills, fatigue, weakness. ENT: Negative for hoarseness, difficulty swallowing , nasal congestion. CV: Negative for chest pain, angina, palpitations, dyspnea on exertion, some peripheral edema the more he walks but goes away at night when he lays down.  Respiratory: Negative for dyspnea at rest, dyspnea on exertion, cough, sputum, wheezing.  GI: See history of present illness. GU:  Negative for dysuria, hematuria, urinary incontinence, urinary frequency, nocturnal urination.  Endo: Negative for unusual weight change.    Physical Examination:   BP 128/83   Pulse 78   Temp (!) 97.1 F (36.2 C) (Temporal)   Ht 5' 8"  (1.727 m)   Wt 221 lb 3.2 oz (100.3 kg)   BMI 33.63 kg/m   General: Well-nourished, well-developed in no acute distress.  Eyes: No icterus. Mouth: masked Lungs: Clear to auscultation bilaterally.  Heart: Regular rate and rhythm, no murmurs rubs or gallops.  Abdomen: Bowel sounds are normal, nondistended, no hepatosplenomegaly or masses, no  abdominal bruits, no rebound or guarding.  Small umbilical hernia, tender to palpation. No significant ascites. Extremities: No lower extremity edema. No clubbing or deformities. Neuro: Alert and oriented x 4   Skin: Warm and dry, no jaundice.   Psych: Alert and cooperative, normal mood and affect.  Labs:  Lab Results  Component Value Date   CREATININE 1.46 (H) 09/16/2020   BUN 30 (H) 09/16/2020   NA 129 (L) 09/16/2020   K 4.9 09/16/2020   CL 96 (L) 09/16/2020   CO2 24 09/16/2020   Lab Results  Component Value Date   ALT 14 09/15/2020   AST 20 09/15/2020   ALKPHOS 74 09/15/2020   BILITOT 0.8 09/15/2020   Lab Results  Component Value Date   WBC 4.6 09/15/2020   HGB 11.1 (L) 09/15/2020   HCT 34.5 (L) 09/15/2020   MCV 93.0 09/15/2020   PLT 112 (L) 09/15/2020   Lab Results  Component Value Date   INR 1.2 09/14/2020   INR 1.3 (H) 08/06/2020   INR 1.2 02/12/2020     Imaging Studies: US Paracentesis  Result Date: 09/09/2020 INDICATION: Cirrhosis, ascites EXAM: ULTRASOUND GUIDED DIAGNOSTIC AND THERAPEUTIC PARACENTESIS MEDICATIONS: None COMPLICATIONS: None immediate PROCEDURE: Informed written consent was obtained from the patient after a discussion of the risks, benefits and alternatives to treatment. A timeout was performed prior to the initiation of the procedure. Initial ultrasound scanning demonstrates a large amount of ascites within the right lower abdominal quadrant. The right lower abdomen was prepped and draped in the usual sterile fashion. 1% lidocaine was used for local anesthesia. Following this, a 5 [place] Yueh catheter was introduced. An ultrasound image was saved for documentation purposes. The paracentesis was performed. The catheter was removed and a dressing was applied. The patient tolerated the procedure well without immediate post procedural complication. Patient received post-procedure intravenous albumin; see nursing notes for details. FINDINGS: A total of  approximately 5 L of yellow ascitic fluid was removed. Samples were sent to the laboratory as requested by the clinical team. IMPRESSION: Successful ultrasound-guided paracentesis yielding 5 liters of peritoneal fluid. Electronically Signed   By: Lavonia Dana M.D.   On: 09/09/2020 12:32    Assessment/plan:  Pleasant 44 year old male with history of cirrhosis, recently admitted with SBP, presenting for follow-up today.  Cirrhosis: Complicated by esophageal varices, recurrent ascites, hepatic encephalopathy, portal hypertension, partially occlusive portal venous thrombosis on Eliquis, SBP.  Overall improved stable.  Complains of some memory issues.  This has been going on for quite some  time and no significant improvement with lactulose and Xifaxan.  Not clear that this is related to hepatic encephalopathy.  He is following with [facility] and has an appointment next month to be evaluated for possible liver transplant.  He is watching his sodium intake closely.  This has resulted in less frequent abdominal paracenteses.  He is writing down everything that he eats.  He has increased his daily exercise although he finds this difficult due to discomfort from his right inguinal hernia and umbilical hernia.  Currently up-to-date on hepatoma screening.  We will update his liver labs today.  Recent CT with decreased burden of nonocclusive chronic thin laminar thrombus running along the wall of the portal vein extending into the right portal vein.  Umbilical hernia/right inguinal hernia: Painful with ambulation/exercise.  History of abdominal paracentesis approximately once per month.  It is unclear whether he would be a surgical candidate but he is interested in reevaluation given the amount of pain that he has been having.  Refer back to general surgery.  Sleep concern: Patient has had significant issues with sleep.  May fall asleep, wakes up with increased heart rate and shortness of breath,  difficulty going back to sleep.  Has been noted to snore significantly.  Query sleep apnea.  Recommend sleep study.  SBP: Improved.  We will continue for Cipro 500 mg daily.  Nonselective beta-blocker has been discontinued.  Esophageal varices: Chronically anticoagulated for history of portal venous thrombosis. [place] advising updating endoscopy, consideration of esophageal variceal banding if needed.  Patient no longer candidate for nonselective beta-blocker.  We will arrange EGD with esophageal variceal banding if needed, with Dr. Gala Romney, conscious sedation in the next couple months. ASA III.  I have discussed the risks, alternatives, benefits with regards to but not limited to the risk of reaction to medication, bleeding, infection, perforation and the patient is agreeable to proceed. Written consent to be obtained.     Skin mole on abdomen: dark, some irregularity to borders. Recommend seeing dermatology.   1. Update labs today. 2. Referral to Dr. Arnoldo Morale for reevaluation of the umbilical hernia and right inguinal hernia given ongoing discomfort. 3. Referral for sleep apnea test. 4. Referral for skin exam, irregular mole on abdomen. 5. EGD with possible esophageal variceal banding around June, with Dr. Gala Romney, conscious sedation ASA III.  Hold Eliquis 48 hours before. 6. Continue Cipro 500 mg daily for SBP prophylaxis. 7. Keep upcoming appointment with [facility]. 8. Return to our office in August or call us questions or concerns.

## 2020-09-24 NOTE — Patient Instructions (Addendum)
1. Please go for lab work today.   2. Referral to Dr. Arnoldo Morale to reevaluate your umbilical hernia and right inguinal hernia given ongoing discomfort. 3. Referral for sleep apnea test. 4. Keep appointment next month with [facility] in [place]. 5. We will work towards scheduling you for an endoscopy to band esophageal varices around June.  Further instructions to follow. 6. Continue present medications including lifelong Cipro 500 mg daily to prevent infection in your abdomen. 7. You can take Neurontin at bedtime every night if you need it. 8. Return to the office in August, call sooner if any questions or concerns.

## 2020-09-24 NOTE — Telephone Encounter (Signed)
Patient needs referral to dermatology for skin exam, evaluate mole on abdomen.

## 2020-09-27 NOTE — Telephone Encounter (Signed)
Received fax from Dr. Juel Burrow office, they are not contracted with pt's insurance ([place] [employer] [place]).  Referral faxed to [facility].

## 2020-09-27 NOTE — Addendum Note (Signed)
Addended by: Hassan Rowan on: 09/27/2020 08:25 AM   Modules accepted: Orders

## 2020-09-27 NOTE — Telephone Encounter (Signed)
Referral faxed to Dr. Juel Burrow office.

## 2020-09-30 ENCOUNTER — Telehealth: Payer: Self-pay | Admitting: Gastroenterology

## 2020-09-30 NOTE — Telephone Encounter (Addendum)
Called pt, informed him EGD scheduled for 11/03/20 is being cancelled because Dr. Gala Romney requests for him to have deeper sedation. Will call back to reschedule when Dr. Roseanne Kaufman July schedule is available. Informed endo scheduler to cancel procedure for 11/03/20.

## 2020-09-30 NOTE — Telephone Encounter (Signed)
Discussed with Dr. Gala Romney, he states patient should have EGD with possible esophageal variceal banding with propofol. He is ASA III/IV.   He wants him to hold his Eliquis 3 days.   Please reschedule accordingly.

## 2020-10-04 NOTE — Telephone Encounter (Signed)
Received fax from [facility] with CPT codes for an [place].  Called dermatology office and spoke to receptionist. She said they had faxed a Medicaid form to our office to complete for referral and someone from our office had return fax and asked for CPT codes.   Alejandro Porter had faxed the Medicaid form. Gave her fax received today.

## 2020-10-06 NOTE — Telephone Encounter (Signed)
Spoke with pts insurance company. CPT codes 95974, 71855 x 1, 99213, 99214 x 2, were given to pts insurance company. Per pt's insurance company no Auth is needed per the CPT codes. Pt is able to be seen at this office per pts insurance company. Faxed this information back to [facility]. It will be scanned in pts chart.

## 2020-10-11 ENCOUNTER — Emergency Department (HOSPITAL_COMMUNITY)
Admission: EM | Admit: 2020-10-11 | Discharge: 2020-10-11 | Disposition: A | Discharge status: Home / Self Care | Payer: Medicaid Other | Attending: Emergency Medicine | Admitting: Emergency Medicine

## 2020-10-11 ENCOUNTER — Telehealth: Payer: Self-pay | Admitting: Internal Medicine

## 2020-10-11 ENCOUNTER — Encounter (HOSPITAL_COMMUNITY): Payer: Self-pay | Admitting: *Deleted

## 2020-10-11 ENCOUNTER — Emergency Department (HOSPITAL_COMMUNITY): Payer: Medicaid Other

## 2020-10-11 ENCOUNTER — Other Ambulatory Visit: Payer: Self-pay

## 2020-10-11 DIAGNOSIS — Z7901 Long term (current) use of anticoagulants: Secondary | ICD-10-CM | POA: Diagnosis not present

## 2020-10-11 DIAGNOSIS — Z79899 Other long term (current) drug therapy: Secondary | ICD-10-CM | POA: Diagnosis not present

## 2020-10-11 DIAGNOSIS — R609 Edema, unspecified: Secondary | ICD-10-CM | POA: Insufficient documentation

## 2020-10-11 DIAGNOSIS — R0602 Shortness of breath: Secondary | ICD-10-CM | POA: Diagnosis not present

## 2020-10-11 DIAGNOSIS — R519 Headache, unspecified: Secondary | ICD-10-CM | POA: Diagnosis not present

## 2020-10-11 DIAGNOSIS — M79605 Pain in left leg: Secondary | ICD-10-CM | POA: Diagnosis present

## 2020-10-11 DIAGNOSIS — I10 Essential (primary) hypertension: Secondary | ICD-10-CM | POA: Insufficient documentation

## 2020-10-11 DIAGNOSIS — Z86718 Personal history of other venous thrombosis and embolism: Secondary | ICD-10-CM | POA: Diagnosis not present

## 2020-10-11 DIAGNOSIS — R600 Localized edema: Secondary | ICD-10-CM

## 2020-10-11 LAB — COMPREHENSIVE METABOLIC PANEL
ALT: 24 U/L (ref 0–44)
AST: 29 U/L (ref 15–41)
Albumin: 3.3 g/dL — ABNORMAL LOW (ref 3.5–5.0)
Alkaline Phosphatase: 93 U/L (ref 38–126)
Anion gap: 7 (ref 5–15)
BUN: 26 mg/dL — ABNORMAL HIGH (ref 6–20)
CO2: 26 mmol/L (ref 22–32)
Calcium: 8.7 mg/dL — ABNORMAL LOW (ref 8.9–10.3)
Chloride: 94 mmol/L — ABNORMAL LOW (ref 98–111)
Creatinine, Ser: 1.17 mg/dL (ref 0.61–1.24)
GFR, Estimated: >60 mL/min (ref >60)
Glucose, Bld: 95 mg/dL (ref 70–99)
Potassium: 4.8 mmol/L (ref 3.5–5.1)
Sodium: 127 mmol/L — ABNORMAL LOW (ref 135–145)
Total Bilirubin: 0.6 mg/dL (ref 0.3–1.2)
Total Protein: 7.3 g/dL (ref 6.5–8.1)

## 2020-10-11 LAB — CBC WITH DIFFERENTIAL/PLATELET
Abs Immature Granulocytes: 0.01 10*3/uL (ref 0.00–0.07)
Basophils Absolute: 0 10*3/uL (ref 0.0–0.1)
Basophils Relative: 0 %
Eosinophils Absolute: 0.1 10*3/uL (ref 0.0–0.5)
Eosinophils Relative: 3 %
HCT: 30.4 % — ABNORMAL LOW (ref 39.0–52.0)
Hemoglobin: 9.7 g/dL — ABNORMAL LOW (ref 13.0–17.0)
Immature Granulocytes: 0 %
Lymphocytes Relative: 10 %
Lymphs Abs: 0.3 10*3/uL — ABNORMAL LOW (ref 0.7–4.0)
MCH: 29.7 pg (ref 26.0–34.0)
MCHC: 31.9 g/dL (ref 30.0–36.0)
MCV: 93 fL (ref 80.0–100.0)
Monocytes Absolute: 0.5 10*3/uL (ref 0.1–1.0)
Monocytes Relative: 15 %
Neutro Abs: 2.3 10*3/uL (ref 1.7–7.7)
Neutrophils Relative %: 72 %
Platelets: 102 10*3/uL — ABNORMAL LOW (ref 150–400)
RBC: 3.27 MIL/uL — ABNORMAL LOW (ref 4.22–5.81)
RDW: 14.7 % (ref 11.5–15.5)
WBC: 3.2 10*3/uL — ABNORMAL LOW (ref 4.0–10.5)
nRBC: 0 % (ref 0.0–0.2)

## 2020-10-11 LAB — LIPASE, BLOOD: Lipase: 52 U/L — ABNORMAL HIGH (ref 11–51)

## 2020-10-11 LAB — AMMONIA: Ammonia: <9 umol/L — ABNORMAL LOW (ref 9–35)

## 2020-10-11 LAB — BRAIN NATRIURETIC PEPTIDE: B Natriuretic Peptide: 10 pg/mL (ref 0.0–100.0)

## 2020-10-11 MED ORDER — PROCHLORPERAZINE EDISYLATE 10 MG/2ML IJ SOLN
10.0000 mg | Freq: Once | INTRAMUSCULAR | Status: AC
  Administered 2020-10-11: 10 mg via INTRAVENOUS
  Filled 2020-10-11: qty 2

## 2020-10-11 MED ORDER — SODIUM CHLORIDE 0.9 % IV BOLUS
1000.0000 mL | Freq: Once | INTRAVENOUS | Status: AC
  Administered 2020-10-11: 1000 mL via INTRAVENOUS

## 2020-10-11 MED ORDER — DIPHENHYDRAMINE HCL 50 MG/ML IJ SOLN
12.5000 mg | Freq: Once | INTRAMUSCULAR | Status: AC
  Administered 2020-10-11: 12.5 mg via INTRAVENOUS
  Filled 2020-10-11: qty 1

## 2020-10-11 NOTE — Discharge Instructions (Addendum)
It was a pleasure taking care of your today.  As discussed, your ultrasound was negative for any blood clots in your left leg. Your labs were reassuring today. Your CT head was negative for any bleeding. Please call your GI doctor tomorrow to schedule an appointment to have fluid drained from your abdomen. Return to the ER for new or worsening symptoms.

## 2020-10-11 NOTE — ED Provider Notes (Signed)
Emergency Medicine Provider Triage Evaluation Note  Alejandro Porter , a 45 y.o. male  was evaluated in triage.  Pt complains of left lower extremity pain and edema.  Patient states he has noticed left lower extremity edema that occurs after walking. He is chronically on Eliquis due to portal vein thrombosis which he has been compliant with. Admits to intermittent shortness of breath. No chest pain.   Review of Systems  Positive: Myalgia, edema Negative: Chest pain  Physical Exam  BP 114/82 (BP Location: Right Arm)   Pulse 86   Temp 98.3 F (36.8 C) (Oral)   Resp 16   SpO2 100%  Gen:   Awake, no distress   Resp:  Normal effort  MSK:   Moves extremities without difficulty. No pitting edema. No calf tenderness Other:    Medical Decision Making  Medically screening exam initiated at 4:49 PM.  Appropriate orders placed.  [name] was informed that the remainder of the evaluation will be completed by another provider, this initial triage assessment does not replace that evaluation, and the importance of remaining in the ED until their evaluation is complete.  LLE edema and pain. [place] ordered to rule out DVT.    Karie Kirks 10/11/20 1651    Carmin Muskrat, MD 10/14/20 2225

## 2020-10-11 NOTE — Telephone Encounter (Signed)
Patient did not have swelling of lower extremities when I saw him 09/24/20. He is on Eliquis chronically.   I worry about unilateral leg swelling and possibility for blood clot. I feel like patient should be evaluated prior to ordering imaging however.   Can we see if his PCP can see him today or tomorrow for unilateral leg swelling, rule out blood clot?   He may need abdominal tap but would like for his unilateral leg swelling to be addressed first.

## 2020-10-11 NOTE — ED Triage Notes (Signed)
Pain in left leg, concerned he may have a blood clot

## 2020-10-11 NOTE — ED Triage Notes (Signed)
Also has a headache

## 2020-10-11 NOTE — ED Notes (Signed)
Pt ambulatory to bathroom, gait steady.

## 2020-10-11 NOTE — Telephone Encounter (Signed)
Spoke with pt.  He was made aware of recommendations.  He said he is going to see if he can be seen today since he has an appointment with Dr. Arnoldo Morale tomorrow.  Called and spoke to [facility] and informed them of what was going on with pt.  She informed me that they could see him today.  She said that he called in and left a message with the nurse.

## 2020-10-11 NOTE — Telephone Encounter (Signed)
Pt was calling to let us know that he has had a headache for the past few days and is staying swollen.

## 2020-10-11 NOTE — ED Provider Notes (Signed)
[facility] EMERGENCY DEPARTMENT Provider Note   CSN: [id number] Arrival date & time: 10/11/20  1543     History Chief Complaint  Patient presents with  . Leg Pain    Alejandro Porter is a 45 y.o. male with a past medical history significant for hepatic cirrhosis, portal hypertension, history of portal vein thrombosis on chronic Eliquis, and hypertension to the ED due to left lower extremity pain and edema x3 days.  Patient states he is concerned that he has developed a DVT.  He has been compliant with his Eliquis with no missed doses.  Denies chest pain however, admits to intermittent shortness of breath.  Patient states lower extremity edema occurs after being on his feet. No calf tenderness. Denies fever and chills. No injury to LLE.   Patient also endorses bilateral temporal headache that has been intermittent for the past 3 days.  Denies visual changes, speech changes, unilateral weakness.  Patient notes he has a history of headaches; however, states this headache feels different given intractability. No head injury. No treatment prior to arrival.   History obtained from patient and past medical records. No interpreter used during encounter.      Past Medical History:  Diagnosis Date  . H/O colonoscopy   . H/O endoscopy   . Hypertension   . Liver cirrhosis ([place])   . Neuropathy   . S/P abdominal paracentesis     Patient Active Problem List   Diagnosis Date Noted  . Umbilical hernia 54/65/6812  . Right inguinal hernia 09/24/2020  . Sleep concern 09/24/2020  . Staph Auricularis SBP (spontaneous bacterial peritonitis) ([place]) 09/14/2020  . Hyponatremia 09/14/2020  . GERD (gastroesophageal reflux disease) 09/14/2020  . Dysphagia 08/05/2020  . Abdominal pain 08/05/2020  . Encephalopathy, hepatic ([place]) 05/11/2020  . Constipation 02/04/2020  . Thrombocytopenia ([place]) 10/20/2019  . Acute liver failure without hepatic coma   . Other ascites   . Colorectal polyps   . Cecal polyp    . Hepatic cirrhosis ([place]) 09/10/2019  . Edema 09/10/2019  . Portal hypertension ([place]) 09/10/2019  . Cirrhosis of liver with ascites ([place]) 09/10/2019  . 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  . Anasarca     Past Surgical History:  Procedure Laterality Date  . BIOPSY  09/12/2019   Procedure: BIOPSY;  Surgeon: Daneil Dolin, MD;  Location: [facility] ENDO SUITE;  Service: Endoscopy;;  . COLONOSCOPY N/A 09/18/2019   Dr. Oneida Alar: 12 colon polyps removed, multiple simple adenomas and some inflammatory polyps, diverticulosis, external and internal hemorrhoids.  Next colonoscopy in 3 years.  . ESOPHAGEAL BANDING N/A 09/12/2019   Procedure: ESOPHAGEAL BANDING;  Surgeon: Daneil Dolin, MD;  Location: [facility] ENDO SUITE;  Service: Endoscopy;  Laterality: N/A;  . ESOPHAGOGASTRODUODENOSCOPY N/A 09/12/2019   Dr. Gala Romney: Mild erosive reflux esophagitis.  Schatzki ring status post dilation.  Small grade 1/grade 2 esophageal varices, portal hypertensive gastropathy, hyperplastic gastric polyp, gastric erosions with chronic inactive gastritis on biopsies, no H. pylori.  Marland Kitchen POLYPECTOMY  09/18/2019   Procedure: POLYPECTOMY;  Surgeon: Danie Binder, MD;  Location: [facility];  Service: Endoscopy;;       Family History  Problem Relation Age of Onset  . Brain cancer Mother   . Diabetes Mother   . Diabetes Father   . Pulmonary embolism Brother   . Liver disease Neg Hx     Social History   Tobacco Use  . Smoking status: Never Smoker  . Smokeless tobacco: Never Used  Vaping Use  .  Vaping Use: Never used  Substance Use Topics  . Alcohol use: Never  . Drug use: Never    Home Medications Prior to Admission medications   Medication Sig Start Date End Date Taking? Authorizing Provider  apixaban (ELIQUIS) 5 MG TABS tablet Take 1 tablet (5 mg total) by mouth 2 (two) times daily. 09/22/19  Yes Emokpae, Courage, MD  Cholecalciferol (VITAMIN D3) 125 MCG (5000 UT) TABS Take 1 tablet by mouth daily. 06/17/19  Yes [provider]  cyclobenzaprine (FLEXERIL) 5 MG tablet Take 5 mg by mouth 2 (two) times daily as needed. 09/29/20  Yes [provider]  Ferrous Sulfate (IRON) 325 (65 Fe) MG TABS Take 1 tablet (325 mg total) by mouth daily. 09/22/19  Yes Emokpae, Courage, MD  furosemide (LASIX) 20 MG tablet Take 1 tablet (20 mg total) by mouth 2 (two) times daily. 09/16/20  Yes Emokpae, Courage, MD  gabapentin (NEURONTIN) 300 MG capsule Take 1 capsule (300 mg total) by mouth at bedtime. 09/22/19  Yes Emokpae, Courage, MD  lactulose (CHRONULAC) 10 GM/15ML solution Take 15 mLs (10 g total) by mouth daily. Patient taking differently: Take 30 g by mouth 2 (two) times daily. 02/04/20  Yes Carlis Stable, NP  levOCARNitine (CARNITOR) 330 MG tablet Take 1 tablet by mouth 3 (three) times daily. 09/01/20  Yes [provider]  Multiple Vitamin (MULTIVITAMIN) tablet Take 1 tablet by mouth daily.   Yes [provider]  omeprazole (PRILOSEC) 20 MG capsule Take 1 capsule (20 mg total) by mouth daily. 09/22/19 09/21/20 Yes Emokpae, Courage, MD  rifaximin (XIFAXAN) 550 MG TABS tablet Take 1 tablet (550 mg total) by mouth 2 (two) times daily. 09/16/20  Yes Roxan Hockey, MD  spironolactone (ALDACTONE) 50 MG tablet Take 1 tablet (50 mg total) by mouth 2 (two) times daily with a meal. 09/16/20  Yes Emokpae, Courage, MD  ciprofloxacin (CIPRO) 500 MG tablet Take 1 tablet (500 mg total) by mouth daily. To prevent belly fluid infection Patient not taking: Reported on 10/11/2020 09/21/20   Roxan Hockey, MD  propranolol (INDERAL) 20 MG tablet Take by mouth. Patient not taking: Reported on 10/11/2020 09/28/20   [provider]    Allergies    Chlorthalidone and Metoprolol  Review of Systems   Review of Systems  Constitutional: Negative for chills and fever.  Eyes: Negative for photophobia and visual disturbance.  Respiratory: Positive for shortness of breath.   Cardiovascular: Negative for chest pain.   Gastrointestinal: Negative for abdominal pain, diarrhea, nausea and vomiting.  Musculoskeletal: Positive for arthralgias.  Neurological: Positive for headaches. Negative for dizziness and facial asymmetry.    Physical Exam Updated Vital Signs BP 111/80   Pulse 82   Temp 98.3 F (36.8 C) (Oral)   Resp 20   SpO2 99%   Physical Exam Vitals and nursing note reviewed.  Constitutional:      General: He is not in acute distress.    Appearance: He is not ill-appearing.  HENT:     Head: Normocephalic.     Nose:     Comments: TTP throughout bilateral maxillary sinuses.  Eyes:     Pupils: Pupils are equal, round, and reactive to light.  Cardiovascular:     Rate and Rhythm: Normal rate and regular rhythm.     Pulses: Normal pulses.     Heart sounds: Normal heart sounds. No murmur heard. No friction rub. No gallop.   Pulmonary:     Effort: Pulmonary effort is normal.  Breath sounds: Normal breath sounds.  Abdominal:     General: Abdomen is flat. There is no distension.     Palpations: Abdomen is soft.     Tenderness: There is no abdominal tenderness. There is no guarding or rebound.  Musculoskeletal:        General: Normal range of motion.     Cervical back: Neck supple.     Comments: 1+ pitting edema bilaterally (R>L). No calf tenderness. Negative homan sign bilaterally.   Skin:    General: Skin is warm and dry.  Neurological:     General: No focal deficit present.     Mental Status: He is alert and oriented to person, place, and time.     Cranial Nerves: No cranial nerve deficit.  Psychiatric:        Mood and Affect: Mood normal.        Behavior: Behavior normal.     ED Results / Procedures / Treatments   Labs (all labs ordered are listed, but only abnormal results are displayed) Labs Reviewed  CBC WITH DIFFERENTIAL/PLATELET - Abnormal; Notable for the following components:      Result Value   WBC 3.2 (*)    RBC 3.27 (*)    Hemoglobin 9.7 (*)    HCT 30.4 (*)     Platelets 102 (*)    Lymphs Abs 0.3 (*)    All other components within normal limits  COMPREHENSIVE METABOLIC PANEL - Abnormal; Notable for the following components:   Sodium 127 (*)    Chloride 94 (*)    BUN 26 (*)    Calcium 8.7 (*)    Albumin 3.3 (*)    All other components within normal limits  LIPASE, BLOOD - Abnormal; Notable for the following components:   Lipase 52 (*)    All other components within normal limits  AMMONIA - Abnormal; Notable for the following components:   Ammonia <9 (*)    All other components within normal limits  BRAIN NATRIURETIC PEPTIDE    EKG None  Radiology CT Head Wo Contrast  Result Date: 10/11/2020 CLINICAL DATA:  Headaches for several days, no known injury, initial encounter EXAM: CT HEAD WITHOUT CONTRAST TECHNIQUE: Contiguous axial images were obtained from the base of the skull through the vertex without intravenous contrast. COMPARISON:  None. FINDINGS: Brain: No evidence of acute infarction, hemorrhage, hydrocephalus, extra-axial collection or mass lesion/mass effect. Vascular: No hyperdense vessel or unexpected calcification. Skull: Normal. Negative for fracture or focal lesion. Sinuses/Orbits: Paranasal sinuses demonstrate mild mucosal thickening within the ethmoid sinuses. Prominence of the left frontal sinus is noted but felt to be congenital in nature. Other: None. IMPRESSION: No acute intracranial abnormality noted. Mild mucosal thickening in the ethmoid sinuses. Electronically Signed   By: Inez Catalina M.D.   On: 10/11/2020 19:11   US Venous Img Lower  Left (DVT Study)  Result Date: 10/11/2020 CLINICAL DATA:  Left leg edema/swelling 4-5 days. EXAM: LEFT LOWER EXTREMITY VENOUS DOPPLER ULTRASOUND TECHNIQUE: Gray-scale sonography with compression, as well as color and duplex ultrasound, were performed to evaluate the deep venous system(s) from the level of the common femoral vein through the popliteal and proximal calf veins. COMPARISON:   None. FINDINGS: VENOUS Normal compressibility of the common femoral, superficial femoral, and popliteal veins, as well as the visualized calf veins. Visualized portions of profunda femoral vein and great saphenous vein unremarkable. No filling defects to suggest DVT on grayscale or color Doppler imaging. Doppler waveforms show normal direction of  venous flow, normal respiratory plasticity and response to augmentation. Limited views of the contralateral common femoral vein are unremarkable. OTHER None. Limitations: none IMPRESSION: Negative. Electronically Signed   By: Marin Olp M.D.   On: 10/11/2020 17:18    Procedures Procedures   Medications Ordered in ED Medications  prochlorperazine (COMPAZINE) injection 10 mg (10 mg Intravenous Given 10/11/20 1822)  diphenhydrAMINE (BENADRYL) injection 12.5 mg (12.5 mg Intravenous Given 10/11/20 1821)  sodium chloride 0.9 % bolus 1,000 mL (1,000 mLs Intravenous New Bag/Given 10/11/20 2015)    ED Course  I have reviewed the triage vital signs and the nursing notes.  Pertinent labs & imaging results that were available during my care of the patient were reviewed by me and considered in my medical decision making (see chart for details).    MDM Rules/Calculators/A&P                         45 year old male presents to the ED due to headache and left lower extremity edema.  Patient is chronically on Eliquis which he has been compliant with.  Upon arrival, vitals all within normal limits.  Patient nontoxic-appearing.  Physical exam reassuring.  Normal neurological exam.  1+ pitting edema bilaterally.  No calf tenderness. Negative Homan sign bilaterally. [place] ordered at triage which I have personally reviewed which is negative for DVT. Given patient's history of cirrhosis, will obtain routine labs and ammonia given lower extremity edema. Migraine cocktail given.   CBC significant for leukopenia at 3.2 and anemia with hemoglobin at 9.7.  BNP normal.  Doubt CHF  exacerbation.  CMP significant for hyponatremia 127 negative BUN at 26.  Lipase elevated at 52. Given no epigastric tenderness, low suspicion for pancreatitis. 1L IVFs given due to hyponatremia. Low suspicion for SBP given no abdomen pain or infectious symptoms. Advised patient to call GI doctor tomorrow to have them coordinate a therapeutic paracentesis. Strict ED precautions discussed with patient. Patient states understanding and agrees to plan. Patient discharged home in no acute distress and stable vitals.  Discussed case with Dr. Vanita Panda who agrees with assessment and plan.  Final Clinical Impression(s) / ED Diagnoses Final diagnoses:  Left leg pain  Lower extremity edema    Rx / DC Orders ED Discharge Orders    None       Karie Kirks 10/11/20 2040    Carmin Muskrat, MD 10/14/20 2225

## 2020-10-11 NOTE — Telephone Encounter (Signed)
Spoke with pt.  He said that he developed stomach bloating and swelling in his left leg 4 to 5 days ago.  He said he thinks it is fluid because he said he always gets a cough when he has fluid.  He says the cough started 4 to 5 days ago along with a terrible headache.  Pt denies any swelling in the right leg.  He has an appointment for his hernia tomorrow at Dr. Cam Hai office.  Pt was seen by [place] on 09/24/2020.

## 2020-10-12 ENCOUNTER — Ambulatory Visit (INDEPENDENT_AMBULATORY_CARE_PROVIDER_SITE_OTHER): Payer: Medicaid Other | Admitting: General Surgery

## 2020-10-12 ENCOUNTER — Encounter: Payer: Self-pay | Admitting: General Surgery

## 2020-10-12 VITALS — BP 122/85 | HR 82 | Temp 98.6°F | Resp 18 | Ht 68.0 in | Wt 227.0 lb

## 2020-10-12 DIAGNOSIS — K4090 Unilateral inguinal hernia, without obstruction or gangrene, not specified as recurrent: Secondary | ICD-10-CM

## 2020-10-12 NOTE — Patient Instructions (Signed)
Inguinal Hernia, Adult An inguinal hernia develops when fat or the intestines push through a weak spot in a muscle where the leg meets the lower abdomen (groin). This creates a bulge. This kind of hernia could also be:  In the scrotum, if you are male.  In folds of skin around the vagina, if you are male. There are three types of inguinal hernias:  Hernias that can be pushed back into the abdomen (are reducible). This type rarely causes pain.  Hernias that are not reducible (are incarcerated).  Hernias that are not reducible and lose their blood supply (are strangulated). This type of hernia requires emergency surgery. What are the causes? This condition is caused by having a weak spot in the muscles or tissues in your groin. This develops over time. The hernia may poke through the weak spot when you suddenly strain your lower abdominal muscles, such as when you:  Lift a heavy object.  Strain to have a bowel movement. Constipation can lead to straining.  Cough. What increases the risk? This condition is more likely to develop in:  Males.  Pregnant females.  People who: ? Are overweight. ? Work in jobs that require long periods of standing or heavy lifting. ? Have had an inguinal hernia before. ? Smoke or have lung disease. These factors can lead to long-term (chronic) coughing. What are the signs or symptoms? Symptoms may depend on the size of the hernia. Often, a small inguinal hernia has no symptoms. Symptoms of a larger hernia may include:  A bulge in the groin area. This is easier to see when standing. It might not be visible when lying down.  Pain or burning in the groin. This may get worse when lifting, straining, or coughing.  A dull ache or a feeling of pressure in the groin.  An unusual bulge in the scrotum, in males. Symptoms of a strangulated inguinal hernia may include:  A bulge in your groin that is very painful and tender to the touch.  A bulge that  turns red or purple.  Fever, nausea, and vomiting.  Inability to have a bowel movement or to pass gas. How is this diagnosed? This condition is diagnosed based on your symptoms, your medical history, and a physical exam. Your health care provider may feel your groin area and ask you to cough. How is this treated? Treatment depends on the size of your hernia and whether you have symptoms. If you do not have symptoms, your health care provider may have you watch your hernia carefully and have you come in for follow-up visits. If your hernia is large or if you have symptoms, you may need surgery to repair the hernia. Follow these instructions at home: Lifestyle  Avoid lifting heavy objects.  Avoid standing for long periods of time.  Do not use any products that contain nicotine or tobacco. These products include cigarettes, chewing tobacco, and vaping devices, such as e-cigarettes. If you need help quitting, ask your health care provider.  Maintain a healthy weight. Preventing constipation You may need to take these actions to prevent or treat constipation:  Drink enough fluid to keep your urine pale yellow.  Take over-the-counter or prescription medicines.  Eat foods that are high in fiber, such as beans, whole grains, and fresh fruits and vegetables.  Limit foods that are high in fat and processed sugars, such as fried or sweet foods. General instructions  You may try to push the hernia back in place by very gently  pressing on it while lying down. Do not try to force the bulge back in if it will not push in easily.  Watch your hernia for any changes in shape, size, or color. Get help right away if you notice any changes.  Take over-the-counter and prescription medicines only as told by your health care provider.  Keep all follow-up visits. This is important. Contact a health care provider if:  You have a fever or chills.  You develop new symptoms.  Your symptoms get  worse. Get help right away if:  You have pain in your groin that suddenly gets worse.  You have a bulge in your groin that: ? Suddenly gets bigger and does not get smaller. ? Becomes red or purple or painful to the touch.  You are a man and you have a sudden pain in your scrotum, or the size of your scrotum suddenly changes.  You cannot push the hernia back in place by very gently pressing on it when you are lying down.  You have nausea or vomiting that does not go away.  You have a fast heartbeat.  You cannot have a bowel movement or pass gas. These symptoms may represent a serious problem that is an emergency. Do not wait to see if the symptoms will go away. Get medical help right away. Call your local emergency services (911 in the [place] Summary  An inguinal hernia develops when fat or the intestines push through a weak spot in a muscle where your leg meets your lower abdomen (groin).  This condition is caused by having a weak spot in muscles or tissues in your groin.  Symptoms may depend on the size of the hernia, and they may include pain or swelling in your groin. A small inguinal hernia often has no symptoms.  Treatment may not be needed if you do not have symptoms. If you have symptoms or a large hernia, you may need surgery to repair the hernia.  Avoid lifting heavy objects. Also, avoid standing for long periods of time. This information is not intended to replace advice given to you by your health care provider. Make sure you discuss any questions you have with your health care provider. Document Revised: 01/20/2020 Document Reviewed: 01/20/2020 Elsevier Patient Education  2021 [employer].

## 2020-10-12 NOTE — Telephone Encounter (Signed)
Patient called in and wanted to let us know he did go to the ED for eval and was told he did not have a blood clot. FYI to leslie

## 2020-10-13 NOTE — Progress Notes (Signed)
Alejandro Porter; [id number]; 1975/08/05   HPI Patient is a 45 year old white male who was referred to my care by gastroenterology for evaluation and treatment of multiple hernias.  I have seen the patient in the past for a right inguinal hernia.  His surgery was deferred as he has liver cirrhosis.  He states he is seeing a transplant team later this month.  He has undergone multiple paracentesis in the past, though it has been less frequent recently.  He states he now has a left inguinal hernia.  It is made worse with straining.  It does reduce on its own. Past Medical History:  Diagnosis Date  . H/O colonoscopy   . H/O endoscopy   . Hypertension   . Liver cirrhosis ([place])   . Neuropathy   . S/P abdominal paracentesis     Past Surgical History:  Procedure Laterality Date  . BIOPSY  09/12/2019   Procedure: BIOPSY;  Surgeon: Daneil Dolin, MD;  Location: [facility];  Service: Endoscopy;;  . COLONOSCOPY N/A 09/18/2019   Dr. Oneida Alar: 12 colon polyps removed, multiple simple adenomas and some inflammatory polyps, diverticulosis, external and internal hemorrhoids.  Next colonoscopy in 3 years.  . ESOPHAGEAL BANDING N/A 09/12/2019   Procedure: ESOPHAGEAL BANDING;  Surgeon: Daneil Dolin, MD;  Location: [facility];  Service: Endoscopy;  Laterality: N/A;  . ESOPHAGOGASTRODUODENOSCOPY N/A 09/12/2019   Dr. Gala Romney: Mild erosive reflux esophagitis.  Schatzki ring status post dilation.  Small grade 1/grade 2 esophageal varices, portal hypertensive gastropathy, hyperplastic gastric polyp, gastric erosions with chronic inactive gastritis on biopsies, no H. pylori.  Marland Kitchen POLYPECTOMY  09/18/2019   Procedure: POLYPECTOMY;  Surgeon: Danie Binder, MD;  Location: [facility];  Service: Endoscopy;;    Family History  Problem Relation Age of Onset  . Brain cancer Mother   . Diabetes Mother   . Diabetes Father   . Pulmonary embolism Brother   . Liver disease Neg Hx     Current Outpatient Medications on  File Prior to Visit  Medication Sig Dispense Refill  . apixaban (ELIQUIS) 5 MG TABS tablet Take 1 tablet (5 mg total) by mouth 2 (two) times daily. 60 tablet 5  . Cholecalciferol (VITAMIN D3) 125 MCG (5000 UT) TABS Take 1 tablet by mouth daily.    . cyclobenzaprine (FLEXERIL) 5 MG tablet Take 5 mg by mouth 2 (two) times daily as needed.    . Ferrous Sulfate (IRON) 325 (65 Fe) MG TABS Take 1 tablet (325 mg total) by mouth daily. 30 tablet 0  . furosemide (LASIX) 20 MG tablet Take 1 tablet (20 mg total) by mouth 2 (two) times daily. 60 tablet 2  . gabapentin (NEURONTIN) 300 MG capsule Take 1 capsule (300 mg total) by mouth at bedtime. 30 capsule 6  . lactulose (CHRONULAC) 10 GM/15ML solution Take 15 mLs (10 g total) by mouth daily. (Patient taking differently: Take 30 g by mouth 2 (two) times daily.) 946 mL 5  . levOCARNitine (CARNITOR) 330 MG tablet Take 1 tablet by mouth 3 (three) times daily.    . Multiple Vitamin (MULTIVITAMIN) tablet Take 1 tablet by mouth daily.    . rifaximin (XIFAXAN) 550 MG TABS tablet Take 1 tablet (550 mg total) by mouth 2 (two) times daily. 60 tablet 3  . spironolactone (ALDACTONE) 50 MG tablet Take 1 tablet (50 mg total) by mouth 2 (two) times daily with a meal. 60 tablet 2  . ciprofloxacin (CIPRO) 500 MG tablet Take 1  tablet (500 mg total) by mouth daily. To prevent belly fluid infection (Patient not taking: No sig reported) 20 tablet 0  . omeprazole (PRILOSEC) 20 MG capsule Take 1 capsule (20 mg total) by mouth daily. 30 capsule 5  . propranolol (INDERAL) 20 MG tablet Take by mouth. (Patient not taking: No sig reported)     No current facility-administered medications on file prior to visit.    Allergies  Allergen Reactions  . Chlorthalidone   . Metoprolol     Social History   Substance and Sexual Activity  Alcohol Use Never    Social History   Tobacco Use  Smoking Status Never Smoker  Smokeless Tobacco Never Used    Review of Systems   Constitutional: Negative.   HENT: Negative.   Eyes: Positive for blurred vision and double vision.  Respiratory: Positive for cough and shortness of breath.   Cardiovascular: Negative.   Gastrointestinal: Negative.   Genitourinary: Negative.   Musculoskeletal: Positive for neck pain.  Skin: Negative.   Neurological: Positive for dizziness and sensory change.  Endo/Heme/Allergies: Negative.   Psychiatric/Behavioral: Negative.     Objective   Vitals:   10/12/20 1253  BP: 122/85  Pulse: 82  Resp: 18  Temp: 98.6 F (37 C)  SpO2: 98%    Physical Exam Vitals reviewed.  Constitutional:      Appearance: Normal appearance. He is not ill-appearing.  HENT:     Head: Normocephalic and atraumatic.  Cardiovascular:     Rate and Rhythm: Normal rate and regular rhythm.     Heart sounds: Normal heart sounds. No murmur heard. No friction rub. No gallop.   Pulmonary:     Effort: Pulmonary effort is normal. No respiratory distress.     Breath sounds: No stridor. No wheezing, rhonchi or rales.  Abdominal:     General: Bowel sounds are normal. There is no distension.     Palpations: Abdomen is soft. There is no mass.     Tenderness: There is no abdominal tenderness. There is no guarding or rebound.     Hernia: A hernia is present.     Comments: Patient has a rotund abdomen with a fluid wave.  He does have a reducible umbilical hernia, right inguinal hernia, and new left inguinal hernia.  Genitourinary:    Penis: Normal.      Testes: Normal.  Skin:    General: Skin is warm and dry.  Neurological:     Mental Status: He is alert and oriented to person, place, and time.   GI notes reviewed  Assessment  Umbilical and bilateral inguinal hernias in the face of known liver cirrhosis.  Patient to be evaluated for liver transplant.  I told the patient that he should only get the hernias fixed if absolutely necessary.  Given his liver cirrhosis, I told him this would have to be done at a  tertiary care center.  He understands this. Plan   Literature was given concerning the hernias.  I told him to let the transplant service know about the hernias.  Follow-up as needed.

## 2020-10-13 NOTE — Telephone Encounter (Signed)
Para scheduled for 10/15/20 at 11:00am, arrive at 10:45am.   Called and informed pt of para appt and Magda Paganini wants him to come back in 6 weeks.  Manuela Schwartz, please schedule appt in 6 weeks.

## 2020-10-13 NOTE — Telephone Encounter (Signed)
Pt called in and said he wanted to know if we could do something about the swelling in his stomach.  Still with cough.  Saw Dr. Arnoldo Morale yesterday and was told he would have to talk to transplant people.  He said Dr. Arnoldo Morale could not perform surgery.

## 2020-10-13 NOTE — Telephone Encounter (Signed)
Reviewed ED records:  Lab Results  Component Value Date   LIPASE 52 (H) 10/11/2020   Lab Results  Component Value Date   CREATININE 1.17 10/11/2020   BUN 26 (H) 10/11/2020   NA 127 (L) 10/11/2020   K 4.8 10/11/2020   CL 94 (L) 10/11/2020   CO2 26 10/11/2020   Lab Results  Component Value Date   ALT 24 10/11/2020   AST 29 10/11/2020   ALKPHOS 93 10/11/2020   BILITOT 0.6 10/11/2020    CT head: no acute findings. LLE dopper: negative for DVT.  CAN HE BE SET UP FOR ABDOMINAL PARACENTESIS? LOOKS LIKE HE HAS STANDING ORDER.  LETS HAVE HIM COME BACK IN IN 6 WEEKS.

## 2020-10-14 ENCOUNTER — Encounter (HOSPITAL_COMMUNITY): Payer: Self-pay

## 2020-10-14 ENCOUNTER — Other Ambulatory Visit: Payer: Self-pay

## 2020-10-14 ENCOUNTER — Ambulatory Visit (HOSPITAL_COMMUNITY)
Admission: RE | Admit: 2020-10-14 | Discharge: 2020-10-14 | Disposition: A | Discharge status: Home / Self Care | Payer: Medicaid Other | Source: Ambulatory Visit | Attending: Nurse Practitioner | Admitting: Nurse Practitioner

## 2020-10-14 DIAGNOSIS — R188 Other ascites: Secondary | ICD-10-CM | POA: Diagnosis present

## 2020-10-14 LAB — BODY FLUID CELL COUNT WITH DIFFERENTIAL
Eos, Fluid: 0 %
Lymphs, Fluid: 26 %
Monocyte-Macrophage-Serous Fluid: 65 % (ref 50–90)
Neutrophil Count, Fluid: 9 % (ref 0–25)
Other Cells, Fluid: REACTIVE %
Total Nucleated Cell Count, Fluid: 512 cu mm (ref 0–1000)

## 2020-10-14 LAB — GRAM STAIN: Gram Stain: NONE SEEN

## 2020-10-14 MED ORDER — ALBUMIN HUMAN 25 % IV SOLN
INTRAVENOUS | Status: AC
  Administered 2020-10-14: 50 g via INTRAVENOUS
  Filled 2020-10-14: qty 200

## 2020-10-14 MED ORDER — ALBUMIN HUMAN 25 % IV SOLN: 0.0000 g | Freq: Once | INTRAVENOUS | Status: AC

## 2020-10-14 NOTE — Procedures (Addendum)
   US guided RLQ paracentesis  5 L dark yellow fluid Sent for labs per MD  Tolerated well

## 2020-10-14 NOTE — Discharge Instructions (Signed)
Paracentesis, Care After This sheet gives you information about how to care for yourself after your procedure. Your health care provider may also give you more specific instructions. If you have problems or questions, contact your health care provider. What can I expect after the procedure? After the procedure, it is common to have a small amount of clear fluid coming from the puncture site. Follow these instructions at home: Puncture site care  Follow instructions from your health care provider about how to take care of your puncture site. Make sure you: ? Wash your hands with soap and water before and after you change your bandage (dressing). If soap and water are not available, use hand sanitizer. ? Change your dressing as told by your health care provider.  Check your puncture area every day for signs of infection. Check for: ? Redness, swelling, or pain. ? More fluid or blood. ? Warmth. ? Pus or a bad smell.   General instructions  Return to your normal activities as told by your health care provider. Ask your health care provider what activities are safe for you.  Take over-the-counter and prescription medicines only as told by your health care provider.  Do not take baths, swim, or use a hot tub until your health care provider approves. Ask your health care provider if you may take showers. You may only be allowed to take sponge baths.  Keep all follow-up visits as told by your health care provider. This is important. Contact a health care provider if:  You have redness, swelling, or pain at your puncture site.  You have more fluid or blood coming from your puncture site.  Your puncture site feels warm to the touch.  You have pus or a bad smell coming from your puncture site.  You have a fever. Get help right away if:  You have chest pain or shortness of breath.  You develop increasing pain, discomfort, or swelling in your abdomen.  You feel dizzy or light-headed or  you faint. Summary  After the procedure, it is common to have a small amount of clear fluid coming from the puncture site.  Follow instructions from your health care provider about how to take care of your puncture site.  Check your puncture area every day signs of infection.  Keep all follow-up visits as told by your health care provider. This information is not intended to replace advice given to you by your health care provider. Make sure you discuss any questions you have with your health care provider. Document Revised: 12/03/2018 Document Reviewed: 03/12/2018 Elsevier Patient Education  2021 [employer].

## 2020-10-14 NOTE — Sedation Documentation (Signed)
Patient tolerated right sided paracentesis procedure well today and 5 liters of clear amber fluid removed with labs collected and sent for processing. Pt vital signs remained stable at completion of procedure and pt verbalized understanding of discharge instructions. PT ambulatory at discharge with NAD noted.

## 2020-10-15 ENCOUNTER — Ambulatory Visit (HOSPITAL_COMMUNITY): Admission: RE | Admit: 2020-10-15 | Payer: Medicaid Other | Source: Ambulatory Visit

## 2020-10-15 NOTE — Telephone Encounter (Signed)
Pt is aware of OV on 7/20 at 3pm with Aliene Altes, PA and also on a cancellation list

## 2020-10-16 LAB — PATHOLOGIST SMEAR REVIEW

## 2020-10-18 ENCOUNTER — Inpatient Hospital Stay (HOSPITAL_COMMUNITY): Payer: Medicaid Other | Attending: Hematology

## 2020-10-18 ENCOUNTER — Other Ambulatory Visit: Payer: Self-pay

## 2020-10-18 DIAGNOSIS — Z832 Family history of diseases of the blood and blood-forming organs and certain disorders involving the immune mechanism: Secondary | ICD-10-CM | POA: Insufficient documentation

## 2020-10-18 DIAGNOSIS — E875 Hyperkalemia: Secondary | ICD-10-CM

## 2020-10-18 DIAGNOSIS — I81 Portal vein thrombosis: Secondary | ICD-10-CM | POA: Diagnosis not present

## 2020-10-18 DIAGNOSIS — Z7901 Long term (current) use of anticoagulants: Secondary | ICD-10-CM | POA: Insufficient documentation

## 2020-10-18 DIAGNOSIS — D696 Thrombocytopenia, unspecified: Secondary | ICD-10-CM | POA: Diagnosis present

## 2020-10-18 DIAGNOSIS — Z808 Family history of malignant neoplasm of other organs or systems: Secondary | ICD-10-CM | POA: Insufficient documentation

## 2020-10-18 DIAGNOSIS — R079 Chest pain, unspecified: Secondary | ICD-10-CM | POA: Diagnosis not present

## 2020-10-18 DIAGNOSIS — D649 Anemia, unspecified: Secondary | ICD-10-CM | POA: Diagnosis not present

## 2020-10-18 DIAGNOSIS — R188 Other ascites: Secondary | ICD-10-CM | POA: Insufficient documentation

## 2020-10-18 DIAGNOSIS — H532 Diplopia: Secondary | ICD-10-CM | POA: Diagnosis not present

## 2020-10-18 DIAGNOSIS — G479 Sleep disorder, unspecified: Secondary | ICD-10-CM | POA: Insufficient documentation

## 2020-10-18 DIAGNOSIS — K7460 Unspecified cirrhosis of liver: Secondary | ICD-10-CM | POA: Insufficient documentation

## 2020-10-18 LAB — CBC WITH DIFFERENTIAL/PLATELET
Abs Immature Granulocytes: 0.01 10*3/uL (ref 0.00–0.07)
Basophils Absolute: 0 10*3/uL (ref 0.0–0.1)
Basophils Relative: 0 %
Eosinophils Absolute: 0.1 10*3/uL (ref 0.0–0.5)
Eosinophils Relative: 2 %
HCT: 34.2 % — ABNORMAL LOW (ref 39.0–52.0)
Hemoglobin: 10.9 g/dL — ABNORMAL LOW (ref 13.0–17.0)
Immature Granulocytes: 0 %
Lymphocytes Relative: 7 %
Lymphs Abs: 0.4 10*3/uL — ABNORMAL LOW (ref 0.7–4.0)
MCH: 29.5 pg (ref 26.0–34.0)
MCHC: 31.9 g/dL (ref 30.0–36.0)
MCV: 92.7 fL (ref 80.0–100.0)
Monocytes Absolute: 0.6 10*3/uL (ref 0.1–1.0)
Monocytes Relative: 10 %
Neutro Abs: 4.6 10*3/uL (ref 1.7–7.7)
Neutrophils Relative %: 81 %
Platelets: 151 10*3/uL (ref 150–400)
RBC: 3.69 MIL/uL — ABNORMAL LOW (ref 4.22–5.81)
RDW: 14.4 % (ref 11.5–15.5)
WBC: 5.8 10*3/uL (ref 4.0–10.5)
nRBC: 0 % (ref 0.0–0.2)

## 2020-10-18 LAB — COMPREHENSIVE METABOLIC PANEL
ALT: 22 U/L (ref 0–44)
AST: 25 U/L (ref 15–41)
Albumin: 3.5 g/dL (ref 3.5–5.0)
Alkaline Phosphatase: 83 U/L (ref 38–126)
Anion gap: 6 (ref 5–15)
BUN: 24 mg/dL — ABNORMAL HIGH (ref 6–20)
CO2: 28 mmol/L (ref 22–32)
Calcium: 9.1 mg/dL (ref 8.9–10.3)
Chloride: 95 mmol/L — ABNORMAL LOW (ref 98–111)
Creatinine, Ser: 1.23 mg/dL (ref 0.61–1.24)
GFR, Estimated: >60 mL/min (ref >60)
Glucose, Bld: 97 mg/dL (ref 70–99)
Potassium: 4.7 mmol/L (ref 3.5–5.1)
Sodium: 129 mmol/L — ABNORMAL LOW (ref 135–145)
Total Bilirubin: 0.8 mg/dL (ref 0.3–1.2)
Total Protein: 7.2 g/dL (ref 6.5–8.1)

## 2020-10-18 LAB — IRON AND TIBC
Iron: 184 ug/dL — ABNORMAL HIGH (ref 45–182)
Saturation Ratios: 49 % — ABNORMAL HIGH (ref 17.9–39.5)
TIBC: 378 ug/dL (ref 250–450)
UIBC: 194 ug/dL

## 2020-10-18 LAB — FERRITIN: Ferritin: 20 ng/mL — ABNORMAL LOW (ref 24–336)

## 2020-10-18 LAB — FOLATE: Folate: 43.6 ng/mL (ref >5.9)

## 2020-10-18 LAB — VITAMIN B12: Vitamin B-12: 701 pg/mL (ref 180–914)

## 2020-10-19 ENCOUNTER — Encounter: Payer: Medicaid Other | Attending: Physician Assistant | Admitting: Nutrition

## 2020-10-19 ENCOUNTER — Telehealth: Payer: Self-pay | Admitting: *Deleted

## 2020-10-19 DIAGNOSIS — K7460 Unspecified cirrhosis of liver: Secondary | ICD-10-CM

## 2020-10-19 DIAGNOSIS — R188 Other ascites: Secondary | ICD-10-CM | POA: Diagnosis present

## 2020-10-19 DIAGNOSIS — E440 Moderate protein-calorie malnutrition: Secondary | ICD-10-CM | POA: Insufficient documentation

## 2020-10-19 DIAGNOSIS — K766 Portal hypertension: Secondary | ICD-10-CM | POA: Insufficient documentation

## 2020-10-19 LAB — CULTURE, BODY FLUID W GRAM STAIN -BOTTLE: Culture: NO GROWTH

## 2020-10-19 NOTE — Progress Notes (Signed)
Medical Nutrition Therapy:  Appt start time: 9147  end time: 1500  Assessment:  Primary concerns today: Cirrhosis of liver, NASH.Marland Kitchen LIves with his dad. "I'm worried about my kidneys."  Had to go to ER due to fluid in abdomen.  Stomach infection and got antibiotics and it's much better now. Legs swelling on left is now normal like the right leg.  He is reading food labels and avoiding foods with high sodium content.  Going to see the liver specialist. Has a hernia. He is fatigured. Gets winded early. Took off 5 liters last week off his abdomen. 11 lbs He feels better still has no energy.  wt 221 lbs.  Has been using a fitbit.  Scheduled to see a sleep DR. Can't sleep without a muscle relaxer. Sleeps with his head elevated.   CMP Latest Ref Rng & Units 10/18/2020 10/11/2020 09/24/2020  Glucose 70 - 99 mg/dL 97 95 95  BUN 6 - 20 mg/dL 24(H) 26(H) 32(H)  Creatinine 0.61 - 1.24 mg/dL 1.23 1.17 1.31(H)  Sodium 135 - 145 mmol/L 129(L) 127(L) 124(L)  Potassium 3.5 - 5.1 mmol/L 4.7 4.8 4.8  Chloride 98 - 111 mmol/L 95(L) 94(L) 92(L)  CO2 22 - 32 mmol/L 28 26 24   Calcium 8.9 - 10.3 mg/dL 9.1 8.7(L) 9.3  Total Protein 6.5 - 8.1 g/dL 7.2 7.3 7.8  Total Bilirubin 0.3 - 1.2 mg/dL 0.8 0.6 0.6  Alkaline Phos 38 - 126 U/L 83 93 85  AST 15 - 41 U/L 25 29 28   ALT 0 - 44 U/L 22 24 23    Wt Readings from Last 3 Encounters:  10/12/20 227 lb (103 kg)  09/24/20 221 lb 3.2 oz (100.3 kg)  09/15/20 223 lb 9.6 oz (101.4 kg)   Ht Readings from Last 3 Encounters:  10/12/20 5' 8"  (1.727 m)  09/24/20 5' 8"  (1.727 m)  09/15/20 5' 8"  (1.727 m)   There is no height or weight on file to calculate BMI. @BMIFA @ Facility age limit for growth percentiles is 20 years. Facility age limit for growth percentiles is 20 years.    Preferred Learning Style:  Auditory       Visual -not reading.  Hands on  Learning Readiness:   Ready  Change in progress   MEDICATIONS:   DIETARY INTAKE:  24-hr recall:   B ( AM):  [place] juice, grilled chicken sandwich, strawberries Snk ( AM):  L ( PM):  2 microwave hamburger  Strawberies,  D ( PM):  [place]af[place] 12 subway Snk ( PM): applesauce,  Beverages: water   Usual physical activity: walk some  Estimated energy needs: 1800-2000  calories 225 g carbohydrates 150  g protein 56 g fat  Progress Towards Goal(s):  In progress.   Nutritional Diagnosis:  NB-1.1 Food and nutrition-related knowledge deficit As related to Cirrhosis NASH.  As evidenced by Ascites.    Intervention:  Nutrition Cirrhosis education provided on My Plate, CHO counting, meal planning, portion sizes, timing of meals, Low salt diet, Cirrhosis Nutrition Therapy. Need for daily weights and calling MD with fluid weight gain. Low Potassium foods.   Goals  Healthy Choice or Smart Ones TV dinner for meals as needed. Eggs, toast and fruit for breakfast Keep Use Mrs Deliah Boston Keep reading food labels.  Teaching Method Utilized:  Visual Auditory Hands on  Handouts given during visit include:  The Plate Method   Low Sodium Foods  Cirrhosis nutrition   Barriers to learning/adherence to lifestyle change: Cirrhosis.  Demonstrated degree of  understanding via:  Teach Back   Monitoring/Evaluation:  Dietary intake, exercise, , and body weight in 3 month(s).

## 2020-10-19 NOTE — Telephone Encounter (Signed)
Ok to update order.

## 2020-10-19 NOTE — Telephone Encounter (Signed)
Referrals placed 

## 2020-10-19 NOTE — Telephone Encounter (Signed)
[facility] Needs updated referral placed for patient as his current one has expired. Looks like [facility] orginially ordered this 10/2019. Pt has appt today with them. Please advise Magda Paganini if okay to place order under you? thanks

## 2020-10-19 NOTE — Addendum Note (Signed)
Addended by: Cheron Every on: 10/19/2020 11:43 AM   Modules accepted: Orders

## 2020-10-20 LAB — PROTEIN ELECTROPHORESIS, SERUM
A/G Ratio: 1.1 (ref 0.7–1.7)
Albumin ELP: 3.5 g/dL (ref 2.9–4.4)
Alpha-1-Globulin: 0.2 g/dL (ref 0.0–0.4)
Alpha-2-Globulin: 0.6 g/dL (ref 0.4–1.0)
Beta Globulin: 0.9 g/dL (ref 0.7–1.3)
Gamma Globulin: 1.6 g/dL (ref 0.4–1.8)
Globulin, Total: 3.3 g/dL (ref 2.2–3.9)
Total Protein ELP: 6.8 g/dL (ref 6.0–8.5)

## 2020-10-23 NOTE — Progress Notes (Signed)
[place] [place], [place] [address]   CLINIC:  Medical Oncology/Hematology  PCP:  Sofie Rower, PA-C [address] / [place] [address]  [phone]  REASON FOR VISIT:  Follow-up for  thrombocytopenia and hyperkalemia  PRIOR THERAPY: none  CURRENT THERAPY: observation  INTERVAL HISTORY:  Mr. Audric Venn, a 45 y.o. male, returns for routine follow-up for his  thrombocytopenia and hyperkalemia. Demondre was last seen on 07/19/2020.  Today he reportts feeling fair. He complains of sleep disturbance, upper CP (present for 2 months; lasts 5-10 minutes while sleeping and during activity), and vision problems. He has blurry and double vision. He denies issues with bleeding or black stools. He is no longer taking iron pills when he previously took iron pills it caused stomach upset but this was tolerable. He denies diarrhea or constipation, and he complains of fatigue. He also denies nosebleeds or throwing up blood.  REVIEW OF SYSTEMS:  Review of Systems  Constitutional: Positive for appetite change (505) and fatigue (40%).  HENT:   Negative for nosebleeds.   Eyes: Positive for eye problems (blurry and double vision).  Respiratory: Positive for shortness of breath.   Cardiovascular: Positive for chest pain.  Gastrointestinal: Negative for blood in stool, constipation and diarrhea.  Neurological: Positive for dizziness (occasional) and numbness (R leg).  Psychiatric/Behavioral: Positive for sleep disturbance.  All other systems reviewed and are negative.   PAST MEDICAL/SURGICAL HISTORY:  Past Medical History:  Diagnosis Date  . H/O colonoscopy   . H/O endoscopy   . Hypertension   . Liver cirrhosis ([place])   . Neuropathy   . S/P abdominal paracentesis    Past Surgical History:  Procedure Laterality Date  . BIOPSY  09/12/2019   Procedure: BIOPSY;  Surgeon: Daneil Dolin, MD;  [facility];  Service: Endoscopy;;  .  COLONOSCOPY N/A 09/18/2019   Dr. Oneida Alar: 12 colon polyps removed, multiple simple adenomas and some inflammatory polyps, diverticulosis, external and internal hemorrhoids.  Next colonoscopy in 3 years.  . ESOPHAGEAL BANDING N/A 09/12/2019   Procedure: ESOPHAGEAL BANDING;  Surgeon: Daneil Dolin, MD;  [facility];  Service: Endoscopy;  Laterality: N/A;  . ESOPHAGOGASTRODUODENOSCOPY N/A 09/12/2019   Dr. Gala Romney: Mild erosive reflux esophagitis.  Schatzki ring status post dilation.  Small grade 1/grade 2 esophageal varices, portal hypertensive gastropathy, hyperplastic gastric polyp, gastric erosions with chronic inactive gastritis on biopsies, no H. pylori.  Marland Kitchen POLYPECTOMY  09/18/2019   Procedure: POLYPECTOMY;  Surgeon: Danie Binder, MD;  [facility];  Service: Endoscopy;;    SOCIAL HISTORY:  Social History   Socioeconomic History  . Marital status: Single    Spouse name: Not on file  . Number of children: Not on file  . Years of education: Not on file  . Highest education level: Not on file  Occupational History  . Occupation: umemployed  Tobacco Use  . Smoking status: Never Smoker  . Smokeless tobacco: Never Used  Vaping Use  . Vaping Use: Never used  Substance and Sexual Activity  . Alcohol use: Never  . Drug use: Never  . Sexual activity: Not on file  Other Topics Concern  . Not on file  Social History Narrative  . Not on file   Social Determinants of Health   Financial Resource Strain: Not on file  Food Insecurity: Not on file  Transportation Needs: Not on file  Physical Activity: Not on file  Stress: Not on file  Social Connections: Not on file  Intimate Partner Violence: Not on file    FAMILY HISTORY:  Family History  Problem Relation Age of Onset  . Brain cancer Mother   . Diabetes Mother   . Diabetes Father   . Pulmonary embolism Brother   . Liver disease Neg Hx     CURRENT MEDICATIONS:  Current Outpatient Medications  Medication  Sig Dispense Refill  . apixaban (ELIQUIS) 5 MG TABS tablet Take 1 tablet (5 mg total) by mouth 2 (two) times daily. 60 tablet 5  . Cholecalciferol (VITAMIN D3) 125 MCG (5000 UT) TABS Take 1 tablet by mouth daily.    . ciprofloxacin (CIPRO) 500 MG tablet Take 1 tablet (500 mg total) by mouth daily. To prevent belly fluid infection (Patient not taking: No sig reported) 20 tablet 0  . cyclobenzaprine (FLEXERIL) 5 MG tablet Take 5 mg by mouth 2 (two) times daily as needed.    . Ferrous Sulfate (IRON) 325 (65 Fe) MG TABS Take 1 tablet (325 mg total) by mouth daily. 30 tablet 0  . furosemide (LASIX) 20 MG tablet Take 1 tablet (20 mg total) by mouth 2 (two) times daily. 60 tablet 2  . gabapentin (NEURONTIN) 300 MG capsule Take 1 capsule (300 mg total) by mouth at bedtime. 30 capsule 6  . lactulose (CHRONULAC) 10 GM/15ML solution Take 15 mLs (10 g total) by mouth daily. (Patient taking differently: Take 30 g by mouth 2 (two) times daily.) 946 mL 5  . levOCARNitine (CARNITOR) 330 MG tablet Take 1 tablet by mouth 3 (three) times daily.    . Multiple Vitamin (MULTIVITAMIN) tablet Take 1 tablet by mouth daily.    Marland Kitchen omeprazole (PRILOSEC) 20 MG capsule Take 1 capsule (20 mg total) by mouth daily. 30 capsule 5  . propranolol (INDERAL) 20 MG tablet Take by mouth. (Patient not taking: No sig reported)    . rifaximin (XIFAXAN) 550 MG TABS tablet Take 1 tablet (550 mg total) by mouth 2 (two) times daily. 60 tablet 3  . spironolactone (ALDACTONE) 50 MG tablet Take 1 tablet (50 mg total) by mouth 2 (two) times daily with a meal. 60 tablet 2   No current facility-administered medications for this visit.    ALLERGIES:  Allergies  Allergen Reactions  . Chlorthalidone   . Metoprolol     PHYSICAL EXAM:  Performance status (ECOG): 1 - Symptomatic but completely ambulatory  There were no vitals filed for this visit. Wt Readings from Last 3 Encounters:  10/12/20 227 lb (103 kg)  09/24/20 221 lb 3.2 oz (100.3 kg)   09/15/20 223 lb 9.6 oz (101.4 kg)   Physical Exam Vitals reviewed.  Constitutional:      Appearance: Normal appearance.  Cardiovascular:     Rate and Rhythm: Normal rate and regular rhythm.     Pulses: Normal pulses.     Heart sounds: Normal heart sounds.  Pulmonary:     Effort: Pulmonary effort is normal.     Breath sounds: Normal breath sounds.  Musculoskeletal:     Right lower leg: No edema.     Left lower leg: No edema.  Neurological:     General: No focal deficit present.     Mental Status: He is alert and oriented to person, place, and time.  Psychiatric:        Mood and Affect: Mood normal.        Behavior: Behavior normal.     LABORATORY DATA:  I have reviewed the labs as listed.  CBC Latest Ref Rng & Units 10/18/2020 10/11/2020 09/24/2020  WBC 4.0 - 10.5 K/uL 5.8 3.2(L) 6.9  Hemoglobin 13.0 - 17.0 g/dL 10.9(L) 9.7(L) 11.7(L)  Hematocrit 39.0 - 52.0 % 34.2(L) 30.4(L) 35.9(L)  Platelets 150 - 400 K/uL 151 102(L) 152   CMP Latest Ref Rng & Units 10/18/2020 10/11/2020 09/24/2020  Glucose 70 - 99 mg/dL 97 95 95  BUN 6 - 20 mg/dL 24(H) 26(H) 32(H)  Creatinine 0.61 - 1.24 mg/dL 1.23 1.17 1.31(H)  Sodium 135 - 145 mmol/L 129(L) 127(L) 124(L)  Potassium 3.5 - 5.1 mmol/L 4.7 4.8 4.8  Chloride 98 - 111 mmol/L 95(L) 94(L) 92(L)  CO2 22 - 32 mmol/L 28 26 24   Calcium 8.9 - 10.3 mg/dL 9.1 8.7(L) 9.3  Total Protein 6.5 - 8.1 g/dL 7.2 7.3 7.8  Total Bilirubin 0.3 - 1.2 mg/dL 0.8 0.6 0.6  Alkaline Phos 38 - 126 U/L 83 93 85  AST 15 - 41 U/L 25 29 28   ALT 0 - 44 U/L 22 24 23       Component Value Date/Time   RBC 3.69 (L) 10/18/2020 0933   MCV 92.7 10/18/2020 0933   MCH 29.5 10/18/2020 0933   MCHC 31.9 10/18/2020 0933   RDW 14.4 10/18/2020 0933   LYMPHSABS 0.4 (L) 10/18/2020 0933   MONOABS 0.6 10/18/2020 0933   EOSABS 0.1 10/18/2020 0933   BASOSABS 0.0 10/18/2020 0933    DIAGNOSTIC IMAGING:  I have independently reviewed the scans and discussed with the patient. CT Head  Wo Contrast  Result Date: 10/11/2020 CLINICAL DATA:  Headaches for several days, no known injury, initial encounter EXAM: CT HEAD WITHOUT CONTRAST TECHNIQUE: Contiguous axial images were obtained from the base of the skull through the vertex without intravenous contrast. COMPARISON:  None. FINDINGS: Brain: No evidence of acute infarction, hemorrhage, hydrocephalus, extra-axial collection or mass lesion/mass effect. Vascular: No hyperdense vessel or unexpected calcification. Skull: Normal. Negative for fracture or focal lesion. Sinuses/Orbits: Paranasal sinuses demonstrate mild mucosal thickening within the ethmoid sinuses. Prominence of the left frontal sinus is noted but felt to be congenital in nature. Other: None. IMPRESSION: No acute intracranial abnormality noted. Mild mucosal thickening in the ethmoid sinuses. Electronically Signed   By: Inez Catalina M.D.   On: 10/11/2020 19:11   US Venous Img Lower  Left (DVT Study)  Result Date: 10/11/2020 CLINICAL DATA:  Left leg edema/swelling 4-5 days. EXAM: LEFT LOWER EXTREMITY VENOUS DOPPLER ULTRASOUND TECHNIQUE: Gray-scale sonography with compression, as well as color and duplex ultrasound, were performed to evaluate the deep venous system(s) from the level of the common femoral vein through the popliteal and proximal calf veins. COMPARISON:  None. FINDINGS: VENOUS Normal compressibility of the common femoral, superficial femoral, and popliteal veins, as well as the visualized calf veins. Visualized portions of profunda femoral vein and great saphenous vein unremarkable. No filling defects to suggest DVT on grayscale or color Doppler imaging. Doppler waveforms show normal direction of venous flow, normal respiratory plasticity and response to augmentation. Limited views of the contralateral common femoral vein are unremarkable. OTHER None. Limitations: none IMPRESSION: Negative. Electronically Signed   By: Marin Olp M.D.   On: 10/11/2020 17:18   US  Paracentesis  Result Date: 10/14/2020 INDICATION: Cirrhosis Ascites EXAM: ULTRASOUND GUIDED RLQ PARACENTESIS MEDICATIONS: 10 cc 1% lidocaine COMPLICATIONS: None immediate. PROCEDURE: Informed written consent was obtained from the patient after a discussion of the risks, benefits and alternatives to treatment. A timeout was  performed prior to the initiation of the procedure. Initial ultrasound scanning demonstrates a large amount of ascites within the right lower abdominal quadrant. The right lower abdomen was prepped and draped in the usual sterile fashion. 1% lidocaine was used for local anesthesia. Following this, a Yueh catheter was introduced. An ultrasound image was saved for documentation purposes. The paracentesis was performed. The catheter was removed and a dressing was applied. The patient tolerated the procedure well without immediate post procedural complication. Patient received post-procedure intravenous albumin; see nursing notes for details. FINDINGS: A total of approximately 5 liters of dark yellow fluid was removed. Samples were sent to the laboratory as requested by the clinical team. IMPRESSION: Successful ultrasound-guided paracentesis yielding 5 liters of peritoneal fluid. Read by Lavonia Drafts [facility] Electronically Signed   By: Lavonia Dana M.D.   On: 10/14/2020 14:12     ASSESSMENT:  1. Mild to moderate thrombocytopenia: -Mild to moderate thrombocytopenia ranging from 90-1 27 since April 2021. -SPEP was negative. B12, methylmalonic acid, folic acid were normal. Hepatitis B was negative. H. pylori was negative. -Thrombocytopenia has been intermittent and resolved on recent labs.  2. Portal vein thrombosis: -Ultrasound Doppler of the liver on 09/11/2019 showed nonocclusive hyperechoic thrombus along the main portal vein wall posteriorly extending into the right portal vein. -He is on Eliquis and is tolerating well.  3. New onset cirrhosis and ascites: -[facility] on 08/17/2019 showed liver morphology consistent with cirrhosis with no masses. Enlarged spleen measuring 22 cm. Nonocclusive thrombus in the portal vein. Prominent to mildly enlarged periceliac and gastrohepatic ligament lymph nodes. 3.5 cm left adrenal mass consistent with adenoma.   PLAN:  1. Mild to moderate thrombocytopenia: -CBC on 10/18/2020 shows normal platelet count of 151.  2. Portal vein thrombosis: -Continue Eliquis.  No bleeding reported.  3. New onset cirrhosis and ascites: -Continue Lasix and spironolactone.  4.  Normocytic anemia: -Labs on 10/18/2020 shows hemoglobin 10.9.  Ferritin is low at 20.  Folic acid and K99 was normal.  SPEP is negative.  Creatinine is 1.23. - Recommend starting iron tablet daily. - Reports having occasional chest pains while exerting and while in bed.  Recommend cardiology evaluation. - RTC 3 months with repeat labs.  Orders placed this encounter:  No orders of the defined types were placed in this encounter.    Derek Jack, MD [place] [phone]   I, Thana Ates, am acting as a scribe for Dr. Derek Jack.  I, Derek Jack MD, have reviewed the above documentation for accuracy and completeness, and I agree with the above.

## 2020-10-25 ENCOUNTER — Other Ambulatory Visit: Payer: Self-pay

## 2020-10-25 ENCOUNTER — Encounter: Payer: Self-pay | Admitting: Nutrition

## 2020-10-25 ENCOUNTER — Inpatient Hospital Stay (HOSPITAL_BASED_OUTPATIENT_CLINIC_OR_DEPARTMENT_OTHER): Payer: Medicaid Other | Admitting: Hematology

## 2020-10-25 VITALS — BP 118/77 | HR 79 | Temp 97.2°F | Resp 20 | Wt 226.5 lb

## 2020-10-25 DIAGNOSIS — E875 Hyperkalemia: Secondary | ICD-10-CM | POA: Diagnosis not present

## 2020-10-25 DIAGNOSIS — D508 Other iron deficiency anemias: Secondary | ICD-10-CM

## 2020-10-25 DIAGNOSIS — D696 Thrombocytopenia, unspecified: Secondary | ICD-10-CM

## 2020-10-25 NOTE — Patient Instructions (Signed)
[facility] at [facility] Discharge Instructions  You were seen today by Dr. Katragadda. He went over your recent results. Dr. Katragadda will see you back in 3 months for labs and follow up.   Thank you for choosing [facility] at [facility] to provide your oncology and hematology care.  To afford each patient quality time with our provider, please arrive at least 15 minutes before your scheduled appointment time.   If you have a lab appointment with the Cancer Center please come in thru the Main Entrance and check in at the main information desk  You need to re-schedule your appointment should you arrive 10 or more minutes late.  We strive to give you quality time with our providers, and arriving late affects you and other patients whose appointments are after yours.  Also, if you no show three or more times for appointments you may be dismissed from the clinic at the providers discretion.     Again, thank you for choosing [facility].  Our hope is that these requests will decrease the amount of time that you wait before being seen by our physicians.       _____________________________________________________________  Should you have questions after your visit to [facility], please contact our office at [phone] between the hours of 8:00 a.m. and 4:30 p.m.  Voicemails left after 4:00 p.m. will not be returned until the following business day.  For prescription refill requests, have your pharmacy contact our office and allow 72 hours.    Cancer Center Support Programs:   > Cancer Support Group  2nd Tuesday of the month 1pm-2pm, Journey Room   

## 2020-10-25 NOTE — Patient Instructions (Signed)
Goals  Healthy Choice or Smart Ones TV dinner for meals as needed. Eggs, toast and fruit for breakfast Keep Use Mrs Deliah Boston Avoid high sodium foods and keep reading food labels.

## 2020-10-26 ENCOUNTER — Ambulatory Visit: Payer: Medicaid Other | Admitting: Pulmonary Disease

## 2020-10-26 ENCOUNTER — Encounter: Payer: Self-pay | Admitting: Pulmonary Disease

## 2020-10-26 ENCOUNTER — Other Ambulatory Visit: Payer: Self-pay

## 2020-10-26 DIAGNOSIS — R188 Other ascites: Secondary | ICD-10-CM | POA: Diagnosis not present

## 2020-10-26 DIAGNOSIS — R0683 Snoring: Secondary | ICD-10-CM | POA: Diagnosis not present

## 2020-10-26 DIAGNOSIS — K7460 Unspecified cirrhosis of liver: Secondary | ICD-10-CM

## 2020-10-26 NOTE — Assessment & Plan Note (Signed)
Other possibilities could be ascites worsening his breathing during sleep I note that he has required more frequent paracenteses over the past few months.  His last paracentesis was 5/12 and he  already seems to have reaccumulated

## 2020-10-26 NOTE — Assessment & Plan Note (Addendum)
He does seem to have be having some of the sleep changes noted with cirrhotics with hepatic encephalopathy such as altered circadian rhythm pattern.  Of concern is his episodes of dyspnea and heart rate racing which would wake him up from sleep.  Given history of snoring, reasonable to proceed with home sleep test to evaluate for obstructive sleep apnea although symptoms are not typical.   The pathophysiology of obstructive sleep apnea , it's cardiovascular consequences & modes of treatment including CPAP were discused with the patient in detail & they evidenced understanding.  Other possibilities could be ascites worsening his breathing during sleep I note that he has required more frequent paracenteses over the past few months.  His last paracentesis was 5/12 and he  already seems to have reaccumulated

## 2020-10-26 NOTE — Progress Notes (Signed)
Subjective:    Patient ID: Alejandro Porter, male    DOB: [birth year], 45 y.o.   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  PMH -nonalcoholic cirrhosis with portal hypertension, grade 2 varices with portal gastropathy , on Eliquis for portal venous thrombosis, requires paracentesis every month  He lives with his father, no bed partner history is available.  I have reviewed consultation [facility] and [facility].  He reports difficulty sleeping for the last 3 to 4 months.  He has numerous awakenings.  He wakes up with difficulty catching his breath and feels like his heart is racing.  Taking a muscle relaxer or pain pill such as gabapentin seems to alleviate his symptoms. Epworth sleepiness score is 1 and he denies excessive daytime somnolence or fatigue.  He now feels that if he naps during the daytime he will not be able to sleep at night. Bedtime is between 11 PM and 1 AM, sleep latency can be up to an hour, he sleeps on his back with 2 pillows with his head of bed elevated, reports 1-2 nocturnal awakenings as described above and is out of bed by 6 AM feeling tired with occasional headaches but denies dryness of mouth There is no history suggestive of cataplexy, sleep paralysis or parasomnias  Snoring has been noted by his father, no apneas have been witnessed  Labs 10/2020 show hemoglobin of 10.9, sodium of 129   Past Medical History:  Diagnosis Date  . H/O colonoscopy   . H/O endoscopy   . Hypertension   . Liver cirrhosis ([place])   . Neuropathy   . S/P abdominal paracentesis    Past Surgical History:  Procedure Laterality Date  . BIOPSY  09/12/2019   Procedure: BIOPSY;  Surgeon: Daneil Dolin, MD;  [facility];  Service: Endoscopy;;  . COLONOSCOPY N/A 09/18/2019   Dr. Oneida Alar: 12 colon polyps removed, multiple simple adenomas and some inflammatory polyps,  diverticulosis, external and internal hemorrhoids.  Next colonoscopy in 3 years.  . ESOPHAGEAL BANDING N/A 09/12/2019   Procedure: ESOPHAGEAL BANDING;  Surgeon: Daneil Dolin, MD;  [facility];  Service: Endoscopy;  Laterality: N/A;  . ESOPHAGOGASTRODUODENOSCOPY N/A 09/12/2019   Dr. Gala Romney: Mild erosive reflux esophagitis.  Schatzki ring status post dilation.  Small grade 1/grade 2 esophageal varices, portal hypertensive gastropathy, hyperplastic gastric polyp, gastric erosions with chronic inactive gastritis on biopsies, no H. pylori.  Marland Kitchen POLYPECTOMY  09/18/2019   Procedure: POLYPECTOMY;  Surgeon: Danie Binder, MD;  [facility];  Service: Endoscopy;;    Allergies  Allergen Reactions  . Chlorthalidone   . Metoprolol     Social History   Socioeconomic History  . Marital status: Single    Spouse name: Not on file  . Number of children: Not on file  . Years of education: Not on file  . Highest education level: Not on file  Occupational History  . Occupation: umemployed  Tobacco Use  . Smoking status: Never Smoker  . Smokeless tobacco: Never Used  Vaping Use  . Vaping Use: Never used  Substance and Sexual Activity  . Alcohol use: Never  . Drug use: Never  . Sexual activity: Not on file  Other Topics Concern  . Not on file  Social History Narrative  . Not on file   Social Determinants of Health   Financial Resource Strain: Not on file  Food Insecurity: Not on file  Transportation Needs: Not on file  Physical Activity: Not on file  Stress: Not on file  Social Connections: Not on file  Intimate Partner Violence: Not on file    Family History  Problem Relation Age of Onset  . Brain cancer Mother   . Diabetes Mother   . Diabetes Father   . Pulmonary embolism Brother   . Liver disease Neg Hx     Review of Systems Constitutional: negative for anorexia, fevers and sweats  Eyes: negative for irritation, redness and visual disturbance  Ears, nose,  mouth, throat, and face: negative for earaches, epistaxis, nasal congestion and sore throat  Respiratory: negative for cough, dyspnea on exertion, sputum and wheezing  Cardiovascular: negative for chest pain, dyspnea, lower extremity edema, orthopnea, palpitations and syncope  Gastrointestinal: negative for abdominal pain, constipation, diarrhea, melena, nausea and vomiting  Genitourinary:negative for dysuria, frequency and hematuria  Hematologic/lymphatic: negative for bleeding, easy bruising and lymphadenopathy  Musculoskeletal:negative for arthralgias, muscle weakness and stiff joints  Neurological: negative for coordination problems, gait problems, headaches and weakness  Endocrine: negative for diabetic symptoms including polydipsia, polyuria and weight loss     Objective:   Physical Exam  Gen. Pleasant, obese, in no distress, normal affect ENT - no pallor,icterus, no post nasal drip, class 2-airway, long uvula , overbite Neck: No JVD, no thyromegaly, no carotid bruits Lungs: no use of accessory muscles, no dullness to percussion, decreased without rales or rhonchi , gynecomastia + Cardiovascular: Rhythm regular, heart sounds  normal, no murmurs or gallops, no peripheral edema Abdomen: soft, distended  and non-tender, no hepatosplenomegaly, BS normal. FLuid thrill +, dilated veins + Musculoskeletal: No deformities, no cyanosis or clubbing Neuro:  alert, non focal, no tremors       Assessment & Plan:

## 2020-10-26 NOTE — Patient Instructions (Signed)
Home sleep study

## 2020-11-02 ENCOUNTER — Encounter (HOSPITAL_COMMUNITY): Payer: Self-pay

## 2020-11-02 ENCOUNTER — Other Ambulatory Visit (HOSPITAL_COMMUNITY)
Admission: RE | Admit: 2020-11-02 | Discharge: 2020-11-02 | Disposition: A | Discharge status: Home / Self Care | Payer: Medicaid Other | Source: Ambulatory Visit | Attending: Nurse Practitioner | Admitting: Nurse Practitioner

## 2020-11-02 ENCOUNTER — Other Ambulatory Visit: Payer: Self-pay

## 2020-11-02 ENCOUNTER — Ambulatory Visit (HOSPITAL_COMMUNITY)
Admission: RE | Admit: 2020-11-02 | Discharge: 2020-11-02 | Disposition: A | Discharge status: Home / Self Care | Payer: Medicaid Other | Source: Ambulatory Visit | Attending: Nurse Practitioner | Admitting: Nurse Practitioner

## 2020-11-02 DIAGNOSIS — R188 Other ascites: Secondary | ICD-10-CM | POA: Insufficient documentation

## 2020-11-02 LAB — BODY FLUID CELL COUNT WITH DIFFERENTIAL
Eos, Fluid: 0 %
Lymphs, Fluid: 51 %
Monocyte-Macrophage-Serous Fluid: 18 % — ABNORMAL LOW (ref 50–90)
Neutrophil Count, Fluid: 31 % — ABNORMAL HIGH (ref 0–25)
Total Nucleated Cell Count, Fluid: 405 cu mm (ref 0–1000)

## 2020-11-02 NOTE — Discharge Instructions (Signed)
Paracentesis, Care After This sheet gives you information about how to care for yourself after your procedure. Your health care provider may also give you more specific instructions. If you have problems or questions, contact your health care provider. What can I expect after the procedure? After the procedure, it is common to have a small amount of clear fluid coming from the puncture site. Follow these instructions at home: Puncture site care  Follow instructions from your health care provider about how to take care of your puncture site. Make sure you: ? Wash your hands with soap and water before and after you change your bandage (dressing). If soap and water are not available, use hand sanitizer. ? Change your dressing as told by your health care provider.  Check your puncture area every day for signs of infection. Check for: ? Redness, swelling, or pain. ? More fluid or blood. ? Warmth. ? Pus or a bad smell.   General instructions  Return to your normal activities as told by your health care provider. Ask your health care provider what activities are safe for you.  Take over-the-counter and prescription medicines only as told by your health care provider.  Do not take baths, swim, or use a hot tub until your health care provider approves. Ask your health care provider if you may take showers. You may only be allowed to take sponge baths.  Keep all follow-up visits as told by your health care provider. This is important. Contact a health care provider if:  You have redness, swelling, or pain at your puncture site.  You have more fluid or blood coming from your puncture site.  Your puncture site feels warm to the touch.  You have pus or a bad smell coming from your puncture site.  You have a fever. Get help right away if:  You have chest pain or shortness of breath.  You develop increasing pain, discomfort, or swelling in your abdomen.  You feel dizzy or light-headed or  you faint. Summary  After the procedure, it is common to have a small amount of clear fluid coming from the puncture site.  Follow instructions from your health care provider about how to take care of your puncture site.  Check your puncture area every day signs of infection.  Keep all follow-up visits as told by your health care provider. This information is not intended to replace advice given to you by your health care provider. Make sure you discuss any questions you have with your health care provider. Document Revised: 12/03/2018 Document Reviewed: 03/12/2018 Elsevier Patient Education  2021 [employer].

## 2020-11-02 NOTE — Procedures (Signed)
PreOperative Dx: Cirrhosis, ascites Postoperative Dx: Cirrhosis, ascites Procedure:   US guided paracentesis Radiologist:  Thornton Papas Anesthesia:  10 ml of1% lidocaine Specimen:  2.9 L of serosanguinous ascitic fluid EBL:   < 1 ml Complications: None

## 2020-11-02 NOTE — Sedation Documentation (Signed)
PT tolerated right sided paracentesis procedure well today and 2.9 Liters of clear serosanguinous fluid removed with labs collected and sent for processing. PT verbalized understanding of discharge instructions and left today with vital signs WNL and NAD noted.

## 2020-11-03 ENCOUNTER — Ambulatory Visit (HOSPITAL_COMMUNITY): Admit: 2020-11-03 | Payer: Medicaid Other | Admitting: Internal Medicine

## 2020-11-03 ENCOUNTER — Encounter (HOSPITAL_COMMUNITY): Payer: Self-pay

## 2020-11-03 LAB — PATHOLOGIST SMEAR REVIEW

## 2020-11-03 SURGERY — EGD (ESOPHAGOGASTRODUODENOSCOPY)
Anesthesia: Moderate Sedation

## 2020-11-11 LAB — CULTURE, BODY FLUID W GRAM STAIN -BOTTLE
Culture: NO GROWTH
Gram Stain: NONE SEEN

## 2020-11-19 ENCOUNTER — Encounter: Payer: Self-pay | Admitting: *Deleted

## 2020-11-19 NOTE — Telephone Encounter (Signed)
Called pt. He has been scheduled for 7/14 at 8:30am, aware will mail prep instructions with pre-op appt. Confirmed address. Aware needs to hold eliquis 3 days prior

## 2020-12-01 ENCOUNTER — Telehealth: Payer: Self-pay | Admitting: Internal Medicine

## 2020-12-01 NOTE — Telephone Encounter (Signed)
Returned the pt's call and he advises that his abd pain is at the top. No nausea/vomiting. Pt states he has been having chest pains with it for 2 weeks now. Pain level is a 4. He has dizziness with standing up. She states its not all the time but it is like his eye's roll back in his head. I advised the pt to go to the ER to be evaluated especially since he gets dizzy with standing. The pt states he don't think he needs to go to just advise the Dr of his symptoms. Please advise

## 2020-12-01 NOTE — Telephone Encounter (Signed)
[phone]  PATIENT CALLED AND SAID THAT HE IS NOT RETAINING FLUID IN HIS ABDOMEN BUT HE "DOESN'T FEEL GOOD"  STARTS THE DAY OFF OK, BUT THEN FEELS VERY BAD.  HE IS ALSO HAVING A PAIN IN HIS CHEST, STATED IT HAS BEEN GOING ON FOR 2 WEEKS

## 2020-12-02 NOTE — Telephone Encounter (Signed)
Phoned and advised the pt to go to the ER due to this symptoms and history. The pt agreed.

## 2020-12-02 NOTE — Telephone Encounter (Signed)
I agree, Dena. He has complicated past medical history to include cirrhosis. Sounds like dealing with orthostatic hypotension, which could be a result of many different things. I agree with ED evaluation.

## 2020-12-07 ENCOUNTER — Other Ambulatory Visit (HOSPITAL_COMMUNITY)
Admission: RE | Admit: 2020-12-07 | Discharge: 2020-12-07 | Disposition: A | Discharge status: Home / Self Care | Payer: Medicaid Other | Source: Ambulatory Visit | Attending: Nephrology | Admitting: Nephrology

## 2020-12-07 ENCOUNTER — Other Ambulatory Visit: Payer: Self-pay

## 2020-12-07 DIAGNOSIS — D638 Anemia in other chronic diseases classified elsewhere: Secondary | ICD-10-CM | POA: Diagnosis present

## 2020-12-07 DIAGNOSIS — K7460 Unspecified cirrhosis of liver: Secondary | ICD-10-CM | POA: Diagnosis present

## 2020-12-07 DIAGNOSIS — N182 Chronic kidney disease, stage 2 (mild): Secondary | ICD-10-CM | POA: Diagnosis present

## 2020-12-07 DIAGNOSIS — E875 Hyperkalemia: Secondary | ICD-10-CM | POA: Diagnosis present

## 2020-12-07 DIAGNOSIS — E871 Hypo-osmolality and hyponatremia: Secondary | ICD-10-CM | POA: Insufficient documentation

## 2020-12-07 LAB — RENAL FUNCTION PANEL
Albumin: 3.6 g/dL (ref 3.5–5.0)
Anion gap: 7 (ref 5–15)
BUN: 32 mg/dL — ABNORMAL HIGH (ref 6–20)
CO2: 24 mmol/L (ref 22–32)
Calcium: 9.1 mg/dL (ref 8.9–10.3)
Chloride: 94 mmol/L — ABNORMAL LOW (ref 98–111)
Creatinine, Ser: 1.39 mg/dL — ABNORMAL HIGH (ref 0.61–1.24)
GFR, Estimated: >60 mL/min (ref >60)
Glucose, Bld: 118 mg/dL — ABNORMAL HIGH (ref 70–99)
Phosphorus: 3.8 mg/dL (ref 2.5–4.6)
Potassium: 5.5 mmol/L — ABNORMAL HIGH (ref 3.5–5.1)
Sodium: 125 mmol/L — ABNORMAL LOW (ref 135–145)

## 2020-12-07 LAB — CBC
HCT: 33.3 % — ABNORMAL LOW (ref 39.0–52.0)
Hemoglobin: 11.1 g/dL — ABNORMAL LOW (ref 13.0–17.0)
MCH: 31.3 pg (ref 26.0–34.0)
MCHC: 33.3 g/dL (ref 30.0–36.0)
MCV: 93.8 fL (ref 80.0–100.0)
Platelets: 139 10*3/uL — ABNORMAL LOW (ref 150–400)
RBC: 3.55 MIL/uL — ABNORMAL LOW (ref 4.22–5.81)
RDW: 14.6 % (ref 11.5–15.5)
WBC: 7.1 10*3/uL (ref 4.0–10.5)
nRBC: 0 % (ref 0.0–0.2)

## 2020-12-07 LAB — PROTEIN / CREATININE RATIO, URINE
Creatinine, Urine: 50.34 mg/dL
Total Protein, Urine: <6 mg/dL

## 2020-12-10 NOTE — Patient Instructions (Signed)
Alejandro Porter  12/10/2020     @PREFPERIOPPHARMACY @   Your procedure is scheduled on  12/16/2020.   Report to [facility] at   0700 AM    Call this number if you have problems the morning of surgery:  [phone]   Remember:  Follow the diet instructions given to you by the office.  Your last dose of eliquis should be 12/12/2020.    Take these medicines the morning of surgery with A SIP OF WATER      flexeril, gabapentin, carnitor, prilosec, xifaxan.     Do not wear jewelry, make-up or nail polish.  Do not wear lotions, powders, or perfumes, or deodorant.  Do not shave 48 hours prior to surgery.  Men may shave face and neck.  Do not bring valuables to the hospital.  [facility] is not responsible for any belongings or valuables.  Contacts, dentures or bridgework may not be worn into surgery.  Leave your suitcase in the car.  After surgery it may be brought to your room.  For patients admitted to the hospital, discharge time will be determined by your treatment team.  Patients discharged the day of surgery will not be allowed to drive home and must have someone with them for 24 hours.    Special instructions:   DO NOT smoke tobacco or vape for 24 hours before your procedure.  Please read over the following fact sheets that you were given. Anesthesia Post-op Instructions and Care and Recovery After Surgery      Upper Endoscopy, Adult, Care After This sheet gives you information about how to care for yourself after your procedure. Your health care provider may also give you more specific instructions. If you have problems or questions, contact your health careprovider. What can I expect after the procedure? After the procedure, it is common to have: A sore throat. Mild stomach pain or discomfort. Bloating. Nausea. Follow these instructions at home:  Follow instructions from your health care provider about what to eat or drink after your procedure. Return to  your normal activities as told by your health care provider. Ask your health care provider what activities are safe for you. Take over-the-counter and prescription medicines only as told by your health care provider. If you were given a sedative during the procedure, it can affect you for several hours. Do not drive or operate machinery until your health care provider says that it is safe. Keep all follow-up visits as told by your health care provider. This is important. Contact a health care provider if you have: A sore throat that lasts longer than one day. Trouble swallowing. Get help right away if: You vomit blood or your vomit looks like coffee grounds. You have: A fever. Bloody, black, or tarry stools. A severe sore throat or you cannot swallow. Difficulty breathing. Severe pain in your chest or abdomen. Summary After the procedure, it is common to have a sore throat, mild stomach discomfort, bloating, and nausea. If you were given a sedative during the procedure, it can affect you for several hours. Do not drive or operate machinery until your health care provider says that it is safe. Follow instructions from your health care provider about what to eat or drink after your procedure. Return to your normal activities as told by your health care provider. This information is not intended to replace advice given to you by your health care provider. Make sure you discuss any questions you have  with your healthcare provider. Document Revised: 05/20/2019 Document Reviewed: 10/22/2017 Elsevier Patient Education  2022 [place] After This sheet gives you information about how to care for yourself after your procedure. Your health care provider may also give you more specific instructions. If you have problems or questions, contact your health careprovider. What can I expect after the procedure? After the procedure, it is common to  have: Tiredness. Forgetfulness about what happened after the procedure. Impaired judgment for important decisions. Nausea or vomiting. Some difficulty with balance. Follow these instructions at home: For the time period you were told by your health care provider:     Rest as needed. Do not participate in activities where you could fall or become injured. Do not drive or use machinery. Do not drink alcohol. Do not take sleeping pills or medicines that cause drowsiness. Do not make important decisions or sign legal documents. Do not take care of children on your own. Eating and drinking Follow the diet that is recommended by your health care provider. Drink enough fluid to keep your urine pale yellow. If you vomit: Drink water, juice, or soup when you can drink without vomiting. Make sure you have little or no nausea before eating solid foods. General instructions Have a responsible adult stay with you for the time you are told. It is important to have someone help care for you until you are awake and alert. Take over-the-counter and prescription medicines only as told by your health care provider. If you have sleep apnea, surgery and certain medicines can increase your risk for breathing problems. Follow instructions from your health care provider about wearing your sleep device: Anytime you are sleeping, including during daytime naps. While taking prescription pain medicines, sleeping medicines, or medicines that make you drowsy. Avoid smoking. Keep all follow-up visits as told by your health care provider. This is important. Contact a health care provider if: You keep feeling nauseous or you keep vomiting. You feel light-headed. You are still sleepy or having trouble with balance after 24 hours. You develop a rash. You have a fever. You have redness or swelling around the IV site. Get help right away if: You have trouble breathing. You have new-onset confusion at  home. Summary For several hours after your procedure, you may feel tired. You may also be forgetful and have poor judgment. Have a responsible adult stay with you for the time you are told. It is important to have someone help care for you until you are awake and alert. Rest as told. Do not drive or operate machinery. Do not drink alcohol or take sleeping pills. Get help right away if you have trouble breathing, or if you suddenly become confused. This information is not intended to replace advice given to you by your health care provider. Make sure you discuss any questions you have with your healthcare provider. Document Revised: 02/05/2020 Document Reviewed: 04/24/2019 Elsevier Patient Education  2022 [employer].

## 2020-12-13 ENCOUNTER — Encounter (HOSPITAL_COMMUNITY)
Admission: RE | Admit: 2020-12-13 | Discharge: 2020-12-13 | Disposition: A | Discharge status: Home / Self Care | Payer: Medicaid Other | Source: Ambulatory Visit | Attending: Internal Medicine | Admitting: Internal Medicine

## 2020-12-13 ENCOUNTER — Other Ambulatory Visit: Payer: Self-pay

## 2020-12-13 ENCOUNTER — Encounter (HOSPITAL_COMMUNITY): Payer: Self-pay

## 2020-12-13 DIAGNOSIS — Z01818 Encounter for other preprocedural examination: Secondary | ICD-10-CM | POA: Diagnosis present

## 2020-12-13 LAB — COMPREHENSIVE METABOLIC PANEL
ALT: 24 U/L (ref 0–44)
AST: 24 U/L (ref 15–41)
Albumin: 3.7 g/dL (ref 3.5–5.0)
Alkaline Phosphatase: 82 U/L (ref 38–126)
Anion gap: 7 (ref 5–15)
BUN: 28 mg/dL — ABNORMAL HIGH (ref 6–20)
CO2: 22 mmol/L (ref 22–32)
Calcium: 8.7 mg/dL — ABNORMAL LOW (ref 8.9–10.3)
Chloride: 93 mmol/L — ABNORMAL LOW (ref 98–111)
Creatinine, Ser: 1.16 mg/dL (ref 0.61–1.24)
GFR, Estimated: >60 mL/min (ref >60)
Glucose, Bld: 112 mg/dL — ABNORMAL HIGH (ref 70–99)
Potassium: 5.4 mmol/L — ABNORMAL HIGH (ref 3.5–5.1)
Sodium: 122 mmol/L — ABNORMAL LOW (ref 135–145)
Total Bilirubin: 0.4 mg/dL (ref 0.3–1.2)
Total Protein: 7.7 g/dL (ref 6.5–8.1)

## 2020-12-13 LAB — PROTIME-INR
INR: 1.3 — ABNORMAL HIGH (ref 0.8–1.2)
Prothrombin Time: 16.2 seconds — ABNORMAL HIGH (ref 11.4–15.2)

## 2020-12-14 ENCOUNTER — Other Ambulatory Visit: Payer: Self-pay | Admitting: *Deleted

## 2020-12-14 DIAGNOSIS — K7460 Unspecified cirrhosis of liver: Secondary | ICD-10-CM

## 2020-12-14 DIAGNOSIS — R188 Other ascites: Secondary | ICD-10-CM

## 2020-12-14 DIAGNOSIS — E875 Hyperkalemia: Secondary | ICD-10-CM

## 2020-12-14 DIAGNOSIS — E871 Hypo-osmolality and hyponatremia: Secondary | ICD-10-CM

## 2020-12-15 ENCOUNTER — Other Ambulatory Visit (HOSPITAL_COMMUNITY)
Admission: RE | Admit: 2020-12-15 | Discharge: 2020-12-15 | Disposition: A | Discharge status: Home / Self Care | Payer: Medicaid Other | Source: Ambulatory Visit | Attending: Gastroenterology | Admitting: Gastroenterology

## 2020-12-15 ENCOUNTER — Other Ambulatory Visit (HOSPITAL_COMMUNITY)
Admission: RE | Admit: 2020-12-15 | Discharge: 2020-12-15 | Disposition: A | Discharge status: Home / Self Care | Payer: Medicaid Other | Source: Ambulatory Visit | Attending: Nephrology | Admitting: Nephrology

## 2020-12-15 ENCOUNTER — Telehealth: Payer: Self-pay

## 2020-12-15 DIAGNOSIS — K7460 Unspecified cirrhosis of liver: Secondary | ICD-10-CM | POA: Insufficient documentation

## 2020-12-15 DIAGNOSIS — E875 Hyperkalemia: Secondary | ICD-10-CM | POA: Insufficient documentation

## 2020-12-15 DIAGNOSIS — N182 Chronic kidney disease, stage 2 (mild): Secondary | ICD-10-CM | POA: Insufficient documentation

## 2020-12-15 DIAGNOSIS — E871 Hypo-osmolality and hyponatremia: Secondary | ICD-10-CM | POA: Insufficient documentation

## 2020-12-15 DIAGNOSIS — D638 Anemia in other chronic diseases classified elsewhere: Secondary | ICD-10-CM | POA: Diagnosis present

## 2020-12-15 DIAGNOSIS — R188 Other ascites: Secondary | ICD-10-CM | POA: Insufficient documentation

## 2020-12-15 LAB — RENAL FUNCTION PANEL
Albumin: 3.6 g/dL (ref 3.5–5.0)
Anion gap: 6 (ref 5–15)
BUN: 22 mg/dL — ABNORMAL HIGH (ref 6–20)
CO2: 23 mmol/L (ref 22–32)
Calcium: 8.9 mg/dL (ref 8.9–10.3)
Chloride: 93 mmol/L — ABNORMAL LOW (ref 98–111)
Creatinine, Ser: 1.25 mg/dL — ABNORMAL HIGH (ref 0.61–1.24)
GFR, Estimated: >60 mL/min (ref >60)
Glucose, Bld: 102 mg/dL — ABNORMAL HIGH (ref 70–99)
Phosphorus: 3.2 mg/dL (ref 2.5–4.6)
Potassium: 4.9 mmol/L (ref 3.5–5.1)
Sodium: 122 mmol/L — ABNORMAL LOW (ref 135–145)

## 2020-12-15 LAB — BASIC METABOLIC PANEL
Anion gap: 7 (ref 5–15)
BUN: 22 mg/dL — ABNORMAL HIGH (ref 6–20)
CO2: 23 mmol/L (ref 22–32)
Calcium: 8.9 mg/dL (ref 8.9–10.3)
Chloride: 92 mmol/L — ABNORMAL LOW (ref 98–111)
Creatinine, Ser: 1.2 mg/dL (ref 0.61–1.24)
GFR, Estimated: >60 mL/min (ref >60)
Glucose, Bld: 102 mg/dL — ABNORMAL HIGH (ref 70–99)
Potassium: 4.9 mmol/L (ref 3.5–5.1)
Sodium: 122 mmol/L — ABNORMAL LOW (ref 135–145)

## 2020-12-15 NOTE — Telephone Encounter (Signed)
Pt called from the lab this morning wanting to know can he have blood drawn from Dr. Theador Porter as well. I advised the pt that he would have to contact that particular office that I could not speak for them. I advised only of what we sent him for. He expressed understanding of what I was saying.

## 2020-12-16 ENCOUNTER — Ambulatory Visit (HOSPITAL_COMMUNITY): Payer: Medicaid Other | Admitting: Anesthesiology

## 2020-12-16 ENCOUNTER — Ambulatory Visit (HOSPITAL_COMMUNITY)
Admission: RE | Admit: 2020-12-16 | Discharge: 2020-12-16 | Disposition: A | Discharge status: Home / Self Care | Payer: Medicaid Other | Attending: Internal Medicine | Admitting: Internal Medicine

## 2020-12-16 ENCOUNTER — Encounter (HOSPITAL_COMMUNITY)
Admission: RE | Disposition: A | Discharge status: Home / Self Care | Payer: Self-pay | Source: Home / Self Care | Attending: Internal Medicine

## 2020-12-16 ENCOUNTER — Encounter (HOSPITAL_COMMUNITY): Payer: Self-pay | Admitting: Internal Medicine

## 2020-12-16 DIAGNOSIS — Z560 Unemployment, unspecified: Secondary | ICD-10-CM | POA: Insufficient documentation

## 2020-12-16 DIAGNOSIS — Z8719 Personal history of other diseases of the digestive system: Secondary | ICD-10-CM | POA: Insufficient documentation

## 2020-12-16 DIAGNOSIS — Z888 Allergy status to other drugs, medicaments and biological substances status: Secondary | ICD-10-CM | POA: Diagnosis not present

## 2020-12-16 DIAGNOSIS — Z7901 Long term (current) use of anticoagulants: Secondary | ICD-10-CM | POA: Diagnosis not present

## 2020-12-16 DIAGNOSIS — I8510 Secondary esophageal varices without bleeding: Secondary | ICD-10-CM | POA: Diagnosis not present

## 2020-12-16 DIAGNOSIS — K317 Polyp of stomach and duodenum: Secondary | ICD-10-CM | POA: Diagnosis not present

## 2020-12-16 DIAGNOSIS — K7460 Unspecified cirrhosis of liver: Secondary | ICD-10-CM | POA: Insufficient documentation

## 2020-12-16 DIAGNOSIS — K3189 Other diseases of stomach and duodenum: Secondary | ICD-10-CM | POA: Insufficient documentation

## 2020-12-16 DIAGNOSIS — Z79899 Other long term (current) drug therapy: Secondary | ICD-10-CM | POA: Insufficient documentation

## 2020-12-16 HISTORY — PX: POLYPECTOMY: SHX5525

## 2020-12-16 HISTORY — PX: ESOPHAGOGASTRODUODENOSCOPY (EGD) WITH PROPOFOL: SHX5813

## 2020-12-16 SURGERY — ESOPHAGOGASTRODUODENOSCOPY (EGD) WITH PROPOFOL
Anesthesia: General

## 2020-12-16 MED ORDER — PHENYLEPHRINE 40 MCG/ML (10ML) SYRINGE FOR IV PUSH (FOR BLOOD PRESSURE SUPPORT)
PREFILLED_SYRINGE | INTRAVENOUS | Status: AC
  Filled 2020-12-16: qty 10

## 2020-12-16 MED ORDER — PROPOFOL 500 MG/50ML IV EMUL
INTRAVENOUS | Status: DC | PRN
  Administered 2020-12-16: 150 ug/kg/min via INTRAVENOUS

## 2020-12-16 MED ORDER — PHENYLEPHRINE 40 MCG/ML (10ML) SYRINGE FOR IV PUSH (FOR BLOOD PRESSURE SUPPORT)
PREFILLED_SYRINGE | INTRAVENOUS | Status: DC | PRN
  Administered 2020-12-16: 80 ug via INTRAVENOUS

## 2020-12-16 MED ORDER — PROPOFOL 10 MG/ML IV BOLUS
INTRAVENOUS | Status: DC | PRN
  Administered 2020-12-16: 100 mg via INTRAVENOUS
  Administered 2020-12-16: 50 mg via INTRAVENOUS
  Administered 2020-12-16: 40 mg via INTRAVENOUS
  Administered 2020-12-16: 50 mg via INTRAVENOUS
  Administered 2020-12-16: 40 mg via INTRAVENOUS

## 2020-12-16 MED ORDER — LIDOCAINE HCL (CARDIAC) PF 100 MG/5ML IV SOSY
PREFILLED_SYRINGE | INTRAVENOUS | Status: DC | PRN
  Administered 2020-12-16: 50 mg via INTRAVENOUS

## 2020-12-16 MED ORDER — LACTATED RINGERS IV SOLN: INTRAVENOUS | Status: DC

## 2020-12-16 NOTE — Anesthesia Procedure Notes (Signed)
Date/Time: 12/16/2020 8:24 AM Performed by: Orlie Dakin, CRNA Pre-anesthesia Checklist: Patient identified, Emergency Drugs available, Suction available and Patient being monitored Patient Re-evaluated:Patient Re-evaluated prior to induction Oxygen Delivery Method: Nasal cannula Induction Type: IV induction Placement Confirmation: positive ETCO2

## 2020-12-16 NOTE — H&P (Signed)
@LOGO @   Primary Care Physician:  Alejandro Rower, PA-C Primary Gastroenterologist:  Dr. Gala Porter  Pre-Procedure History & Physical: HPI:  Alejandro Porter is a 45 y.o. male here for surveillance EGD.  History of grade 1-2 varices last year for Schatzki's ring dilated-dysphagia better.  Hyperplastic polyp removed and no H. pylori on histology.  Recent mild hyperkalemia the setting of renal insufficiency potassium now down to 4.9 chronically hyponatremic. No beta-blocker due to SBP. Past Medical History:  Diagnosis Date   H/O colonoscopy    H/O endoscopy    Hypertension    Liver cirrhosis (HCC)    Neuropathy    S/P abdominal paracentesis     Past Surgical History:  Procedure Laterality Date   BIOPSY  09/12/2019   Procedure: BIOPSY;  Surgeon: Alejandro Dolin, MD;  Location: [facility];  Service: Endoscopy;;   COLONOSCOPY N/A 09/18/2019   Dr. Oneida Porter: 12 colon polyps removed, multiple simple adenomas and some inflammatory polyps, diverticulosis, external and internal hemorrhoids.  Next colonoscopy in 3 years.   ESOPHAGEAL BANDING N/A 09/12/2019   Procedure: ESOPHAGEAL BANDING;  Surgeon: Alejandro Dolin, MD;  Location: [facility];  Service: Endoscopy;  Laterality: N/A;   ESOPHAGOGASTRODUODENOSCOPY N/A 09/12/2019   Dr. Gala Porter: Mild erosive reflux esophagitis.  Schatzki ring status post dilation.  Small grade 1/grade 2 esophageal varices, portal hypertensive gastropathy, hyperplastic gastric polyp, gastric erosions with chronic inactive gastritis on biopsies, no H. pylori.   POLYPECTOMY  09/18/2019   Procedure: POLYPECTOMY;  Surgeon: Alejandro Binder, MD;  Location: [facility];  Service: Endoscopy;;    Prior to Admission medications   Medication Sig Start Date End Date Taking? Authorizing Provider  apixaban (ELIQUIS) 5 MG TABS tablet Take 1 tablet (5 mg total) by mouth 2 (two) times daily. 09/22/19  Yes Alejandro Porter, Courage, MD  Cholecalciferol (VITAMIN D3) 125 MCG (5000 UT) TABS Take 5,000  Units by mouth daily. 06/17/19  Yes [provider]  cyclobenzaprine (FLEXERIL) 5 MG tablet Take 5 mg by mouth 2 (two) times daily as needed for muscle spasms. 09/29/20  Yes [provider]  Ferrous Sulfate (IRON) 325 (65 Fe) MG TABS Take 1 tablet (325 mg total) by mouth daily. Patient taking differently: Take 1 tablet by mouth 2 (two) times daily. 09/22/19  Yes Alejandro Porter, Courage, MD  furosemide (LASIX) 20 MG tablet Take 1 tablet (20 mg total) by mouth 2 (two) times daily. 09/16/20  Yes Alejandro Porter, Courage, MD  gabapentin (NEURONTIN) 300 MG capsule Take 1 capsule (300 mg total) by mouth at bedtime. Patient taking differently: Take 300 mg by mouth 2 (two) times daily as needed (pain). 09/22/19  Yes Alejandro Porter, Courage, MD  lactulose (CHRONULAC) 10 GM/15ML solution Take 15 mLs (10 g total) by mouth daily. Patient taking differently: Take 10 g by mouth 3 (three) times daily. 02/04/20  Yes Alejandro Stable, NP  levOCARNitine (CARNITOR) 330 MG tablet Take 330 mg by mouth 3 (three) times daily. 09/01/20  Yes [provider]  Multiple Vitamin (MULTIVITAMIN) tablet Take 1 tablet by mouth daily.   Yes [provider]  omeprazole (PRILOSEC) 20 MG capsule Take 1 capsule (20 mg total) by mouth daily. 09/22/19 12/03/20 Yes Alejandro Porter, Courage, MD  rifaximin (XIFAXAN) 550 MG TABS tablet Take 1 tablet (550 mg total) by mouth 2 (two) times daily. 09/16/20  Yes Alejandro Porter, Courage, MD  spironolactone (ALDACTONE) 100 MG tablet Take 100 mg by mouth 2 (two) times daily.   Yes [provider]  spironolactone (  ALDACTONE) 50 MG tablet Take 1 tablet (50 mg total) by mouth 2 (two) times daily with a meal. Patient not taking: Reported on 12/03/2020 09/16/20   Alejandro Hockey, MD    Allergies as of 11/19/2020 - Review Complete 11/02/2020  Allergen Reaction Noted   Chlorthalidone  11/04/2019   Metoprolol  11/04/2019    Family History  Problem Relation Age of Onset   Brain cancer Mother    Diabetes  Mother    Diabetes Father    Pulmonary embolism Brother    Liver disease Neg Hx     Social History   Socioeconomic History   Marital status: Single    Spouse name: Not on file   Number of children: Not on file   Years of education: Not on file   Highest education level: Not on file  Occupational History   Occupation: umemployed  Tobacco Use   Smoking status: Never   Smokeless tobacco: Never  Vaping Use   Vaping Use: Never used  Substance and Sexual Activity   Alcohol use: Never   Drug use: Never   Sexual activity: Not Currently  Other Topics Concern   Not on file  Social History Narrative   Not on file   Social Determinants of Health   Financial Resource Strain: Not on file  Food Insecurity: Not on file  Transportation Needs: Not on file  Physical Activity: Not on file  Stress: Not on file  Social Connections: Not on file  Intimate Partner Violence: Not on file    Review of Systems: See HPI, otherwise negative ROS  Physical Exam: BP 129/89   Pulse 73   Temp 98.1 F (36.7 C) (Oral)   Resp 15   SpO2 100%  General:   Alert,  Well-developed, well-nourished, pleasant and cooperative in NAD SNeck:  Supple; no masses or thyromegaly. No significant cervical adenopathy. Lungs:  Clear throughout to auscultation.   No wheezes, crackles, or rhonchi. No acute distress. Heart:  Regular rate and rhythm; no murmurs, clicks, rubs,  or gallops. Abdomen: Non-distended, normal bowel sounds.  Soft and nontender without appreciable mass or hepatosplenomegaly.  Pulses:  Normal pulses noted. Extremities:  Without clubbing or edema.  Impression/Plan: 45 year old gentleman with Alejandro Porter cirrhosis and known esophageal varices here for surveillance.  I discussed potential for prophylactic variceal banding if enlargement of varices. The risks, benefits, limitations, alternatives and imponderables have been reviewed with the patient. Potential for esophageal dilation, biopsy, etc. have also  been reviewed.  Questions have been answered. All parties agreeable.     Notice: This dictation was prepared with Dragon dictation along with smaller phrase technology. Any transcriptional errors that result from this process are unintentional and may not be corrected upon review.

## 2020-12-16 NOTE — Anesthesia Postprocedure Evaluation (Signed)
Anesthesia Post Note  Patient: Alejandro Porter  Procedure(s) Performed: ESOPHAGOGASTRODUODENOSCOPY (EGD) WITH PROPOFOL POLYPECTOMY  Patient location during evaluation: Phase II Anesthesia Type: General Level of consciousness: awake Pain management: pain level controlled Vital Signs Assessment: post-procedure vital signs reviewed and stable Respiratory status: spontaneous breathing and respiratory function stable Cardiovascular status: blood pressure returned to baseline and stable Postop Assessment: no headache and no apparent nausea or vomiting Anesthetic complications: no Comments: Late entry   No notable events documented.   Last Vitals:  Vitals:   12/16/20 0717 12/16/20 0852  BP: 129/89 (!) 95/54  Pulse: 73 76  Resp: 15 16  Temp: 36.7 C 36.7 C  SpO2: 100% 100%    Last Pain:  Vitals:   12/16/20 0852  TempSrc: Oral  PainSc: 0-No pain                 Louann Sjogren

## 2020-12-16 NOTE — Discharge Instructions (Addendum)
EGD Discharge instructions Please read the instructions outlined below and refer to this sheet in the next few weeks. These discharge instructions provide you with general information on caring for yourself after you leave the hospital. Your doctor may also give you specific instructions. While your treatment has been planned according to the most current medical practices available, unavoidable complications occasionally occur. If you have any problems or questions after discharge, please call your doctor. ACTIVITY You may resume your regular activity but move at a slower pace for the next 24 hours.  Take frequent rest periods for the next 24 hours.  Walking will help expel (get rid of) the air and reduce the bloated feeling in your abdomen.  No driving for 24 hours (because of the anesthesia (medicine) used during the test).  You may shower.  Do not sign any important legal documents or operate any machinery for 24 hours (because of the anesthesia used during the test).  NUTRITION Drink plenty of fluids.  You may resume your normal diet.  Begin with a light meal and progress to your normal diet.  Avoid alcoholic beverages for 24 hours or as instructed by your caregiver.  MEDICATIONS You may resume your normal medications unless your caregiver tells you otherwise.  WHAT YOU CAN EXPECT TODAY You may experience abdominal discomfort such as a feeling of fullness or "gas" pains.  FOLLOW-UP Your doctor will discuss the results of your test with you.  SEEK IMMEDIATE MEDICAL ATTENTION IF ANY OF THE FOLLOWING OCCUR: Excessive nausea (feeling sick to your stomach) and/or vomiting.  Severe abdominal pain and distention (swelling).  Trouble swallowing.  Temperature over 101 F (37.8 C).  Rectal bleeding or vomiting of blood.   Stomach polyps removed today  No MRI until clips gone  Varicose veins appeared stable  Office visit with Neil Crouch in 6 weeks if not already scheduled  A repeat  EGD in 18 months  Follow-up on pathology  At patient request, I called Andra Matsuo at [phone] -reviewed findings and recommendations  Resume Eliquis on 12/18/2020

## 2020-12-16 NOTE — Anesthesia Preprocedure Evaluation (Addendum)
Anesthesia Evaluation  Patient identified by MRN, date of birth, ID band Patient awake    Reviewed: Allergy & Precautions, H&P , NPO status , Patient's Chart, lab work & pertinent test results, reviewed documented beta blocker date and time   Airway Mallampati: II  TM Distance: >3 FB Neck ROM: full    Dental no notable dental hx.    Pulmonary neg pulmonary ROS,    Pulmonary exam normal breath sounds clear to auscultation       Cardiovascular Exercise Tolerance: Good hypertension,  Rhythm:regular Rate:Normal     Neuro/Psych negative neurological ROS  negative psych ROS   GI/Hepatic GERD  Medicated,(+) Cirrhosis   Esophageal Varices    ,   Endo/Other  negative endocrine ROS  Renal/GU negative Renal ROS  negative genitourinary   Musculoskeletal   Abdominal   Peds  Hematology negative hematology ROS (+)   Anesthesia Other Findings Significant hyponatremia, but demonstrably chronic.    Reproductive/Obstetrics negative OB ROS                            Anesthesia Physical Anesthesia Plan  ASA: 3  Anesthesia Plan: General   Post-op Pain Management:    Induction:   PONV Risk Score and Plan: Propofol infusion  Airway Management Planned:   Additional Equipment:   Intra-op Plan:   Post-operative Plan:   Informed Consent: I have reviewed the patients History and Physical, chart, labs and discussed the procedure including the risks, benefits and alternatives for the proposed anesthesia with the patient or authorized representative who has indicated his/her understanding and acceptance.     Dental Advisory Given  Plan Discussed with: CRNA  Anesthesia Plan Comments:         Anesthesia Quick Evaluation

## 2020-12-16 NOTE — Transfer of Care (Signed)
Immediate Anesthesia Transfer of Care Note  Patient: Alejandro Porter  Procedure(s) Performed: ESOPHAGOGASTRODUODENOSCOPY (EGD) WITH PROPOFOL POLYPECTOMY  Patient Location: Short Stay  Anesthesia Type:General  Level of Consciousness: awake and alert   Airway & Oxygen Therapy: Patient Spontanous Breathing  Post-op Assessment: Report given to RN and Post -op Vital signs reviewed and stable  Post vital signs: Reviewed and stable  Last Vitals:  Vitals Value Taken Time  BP    Temp    Pulse    Resp    SpO2      Last Pain:  Vitals:   12/16/20 0820  TempSrc:   PainSc: 0-No pain         Complications: No notable events documented.

## 2020-12-16 NOTE — Op Note (Signed)
[facility] Patient Name: Alejandro Porter Procedure Date: 12/16/2020 8:03 AM MRN: [id number] Date of Birth: [birth year] Attending MD: Norvel Richards , MD CSN: [id number] Age: 45 Admit Type: Outpatient Procedure:                Upper GI endoscopy Indications:              Surveillance procedure, Esophageal varices Providers:                Norvel Richards, MD, Jeanann Lewandowsky. Sharon Seller, RN,                            Nelma Rothman, Technician Referring MD:              Medicines:                Propofol per Anesthesia Complications:            No immediate complications. Estimated Blood Loss:     Estimated blood loss was minimal. Procedure:                Pre-Anesthesia Assessment:                           - Prior to the procedure, a History and Physical                            was performed, and patient medications and                            allergies were reviewed. The patient's tolerance of                            previous anesthesia was also reviewed. The risks                            and benefits of the procedure and the sedation                            options and risks were discussed with the patient.                            All questions were answered, and informed consent                            was obtained. Prior Anticoagulants: The patient                            last took Eliquis (apixaban) 3 days prior to the                            procedure. ASA Grade Assessment: IV - A patient                            with severe systemic disease that is a constant  threat to life. After reviewing the risks and                            benefits, the patient was deemed in satisfactory                            condition to undergo the procedure.                           After obtaining informed consent, the endoscope was                            passed under direct vision. Throughout the                             procedure, the patient's blood pressure, pulse, and                            oxygen saturations were monitored continuously. The                            [id number]) was introduced through the mouth,                            and advanced to the second part of duodenum. The                            upper GI endoscopy was accomplished without                            difficulty. The patient tolerated the procedure                            well. Scope In: 8:23:19 AM Scope Out: 8:44:59 AM Total Procedure Duration: 0 hours 21 minutes 40 seconds  Findings:      (2) short columns of no more than grade 2 esophageal varices just above       the GE junction. Appeared innocent. Grade 1 esophageal varices not       apparent today.      Multiple large posterior body and antral hyperplastic, hemorrhagic       appearing polyps. Largest approximately 2.5 cm. Pedunculated. Mild       diffuse gastric mucosal changes to go with portal gastropathy. Patent       pylorus.      The duodenal bulb and second portion of the duodenum were normal.       Multiple hot snare polypectomies performed with multiple polyps       retrieved. 3 hemostasis clips placed on largest polypectomy sites to       ensure good hemostasis. Impression:               - 2 short columns grade 2 esophageal                            varices?"innocent appearing.                           -  Portal gastropathy. Multiple large hemorrhagic                            appearing hyperplastic polyps?"status post snare                            polypectomies and clips placement.                           Normal duodenal bulb and second portion of the                            duodenum. Moderate Sedation:      Moderate (conscious) sedation was personally administered by an       anesthesia professional. The following parameters were monitored: oxygen       saturation, heart rate, blood pressure, respiratory rate, EKG, adequacy        of pulmonary ventilation, and response to care. Recommendation:           - Patient has a contact number available for                            emergencies. The signs and symptoms of potential                            delayed complications were discussed with the                            patient. Return to normal activities tomorrow.                            Written discharge instructions were provided to the                            patient.                           - Advance diet as tolerated. Continue present                            medications. Resume Eliquis 12/18/2020. Follow-up on                            pathology. Office visit with [place] in 6 weeks. Repeat                            EGD in 18 months. No MRI until clips gone. Procedure Code(s):        --- Professional ---                           [phone], Esophagogastroduodenoscopy, flexible,                            transoral; diagnostic, including collection of  specimen(s) by brushing or washing, when performed                            (separate procedure) Diagnosis Code(s):        --- Professional ---                           I85.00, Esophageal varices without bleeding CPT copyright 2019 American Medical Association. All rights reserved. The codes documented in this report are preliminary and upon coder review may  be revised to meet current compliance requirements. Cristopher Estimable. Ines Rebel, MD Norvel Richards, MD 12/16/2020 9:05:18 AM This report has been signed electronically. Number of Addenda: 0

## 2020-12-17 LAB — SURGICAL PATHOLOGY

## 2020-12-18 ENCOUNTER — Encounter: Payer: Self-pay | Admitting: Internal Medicine

## 2020-12-18 NOTE — Progress Notes (Signed)
done

## 2020-12-22 ENCOUNTER — Ambulatory Visit: Payer: Medicaid Other | Admitting: Gastroenterology

## 2020-12-23 ENCOUNTER — Other Ambulatory Visit: Payer: Self-pay

## 2020-12-23 ENCOUNTER — Ambulatory Visit: Payer: Medicaid Other

## 2020-12-23 DIAGNOSIS — R0683 Snoring: Secondary | ICD-10-CM

## 2020-12-23 DIAGNOSIS — G4733 Obstructive sleep apnea (adult) (pediatric): Secondary | ICD-10-CM | POA: Diagnosis not present

## 2020-12-24 ENCOUNTER — Encounter (HOSPITAL_COMMUNITY): Payer: Self-pay | Admitting: Internal Medicine

## 2020-12-27 ENCOUNTER — Telehealth: Payer: Self-pay | Admitting: Pulmonary Disease

## 2020-12-27 DIAGNOSIS — G4733 Obstructive sleep apnea (adult) (pediatric): Secondary | ICD-10-CM

## 2020-12-27 DIAGNOSIS — R0683 Snoring: Secondary | ICD-10-CM

## 2020-12-27 NOTE — Telephone Encounter (Signed)
  HST showed mod  OSA with AHI 15/ hr worse when lying on his back (supine)  Suggest autoCPAP  5-15 cm, mask of choice OV with me/APP in 6 wks after starting

## 2020-12-28 ENCOUNTER — Ambulatory Visit (INDEPENDENT_AMBULATORY_CARE_PROVIDER_SITE_OTHER): Payer: Medicaid Other

## 2020-12-28 ENCOUNTER — Ambulatory Visit: Payer: Medicaid Other | Admitting: Cardiology

## 2020-12-28 ENCOUNTER — Other Ambulatory Visit: Payer: Self-pay

## 2020-12-28 ENCOUNTER — Encounter: Payer: Self-pay | Admitting: Cardiology

## 2020-12-28 ENCOUNTER — Telehealth: Payer: Self-pay | Admitting: Cardiology

## 2020-12-28 ENCOUNTER — Other Ambulatory Visit: Payer: Self-pay | Admitting: Cardiology

## 2020-12-28 VITALS — BP 118/68 | HR 92 | Ht 68.0 in | Wt 217.8 lb

## 2020-12-28 DIAGNOSIS — K7460 Unspecified cirrhosis of liver: Secondary | ICD-10-CM

## 2020-12-28 DIAGNOSIS — R0601 Orthopnea: Secondary | ICD-10-CM | POA: Diagnosis not present

## 2020-12-28 DIAGNOSIS — R188 Other ascites: Secondary | ICD-10-CM

## 2020-12-28 DIAGNOSIS — R002 Palpitations: Secondary | ICD-10-CM | POA: Diagnosis not present

## 2020-12-28 DIAGNOSIS — R0602 Shortness of breath: Secondary | ICD-10-CM

## 2020-12-28 DIAGNOSIS — Z87898 Personal history of other specified conditions: Secondary | ICD-10-CM | POA: Diagnosis not present

## 2020-12-28 NOTE — Progress Notes (Signed)
Cardiology Office Note  Date: 12/28/2020   ID: Alejandro Porter, DOB February 07, 1976, MRN [id number]  PCP:  Sofie Rower, PA-C  Cardiologist:  Rozann Lesches, MD Electrophysiologist:  None   Chief Complaint  Patient presents with   Chest fullness, palpitations     History of Present Illness: Alejandro Porter is a 44 y.o. male referred by Dr. Delton Coombes for the evaluation of chest discomfort, I reviewed his note from May.  He tells me that he experiences a sense of fullness in his chest when he lays down at nighttime, does not consistently happen during the daytime when he is more active.  He has also noticed that his heart rate increases sporadically, sometimes into the 140s by his Fitbit, not always precipitated by activity.  He has had no syncope.  No history of cardiac arrhythmias.  He has cirrhosis complicated by portal hypertension with esophageal varices and also portal vein thrombosis on Eliquis.  He has had previous hepatic encephalopathy with recurrent ascites and SBP.  Last paracentesis was in May, his weight has been stable since then on current diuretics.  He is following with a hepatologist in [place], also gastroenterology and [place].  He has recently documented OSA with plan for CPAP and followed by Dr. Elsworth Soho.  I reviewed his medications, he states that he gets them in pill packs from his pharmacy, only occasionally forgets to take them.  Echocardiogram from April of last year revealed LVEF 60 to 65% and normal RV contraction.  Past Medical History:  Diagnosis Date   CKD (chronic kidney disease) stage 2, GFR 60-89 ml/min    H/O colonoscopy    H/O endoscopy    Hypertension    Liver cirrhosis (HCC)    Neuropathy    OSA (obstructive sleep apnea)    S/P abdominal paracentesis     Past Surgical History:  Procedure Laterality Date   BIOPSY  09/12/2019   Procedure: BIOPSY;  Surgeon: Daneil Dolin, MD;  [facility];  Service: Endoscopy;;    COLONOSCOPY N/A 09/18/2019   Dr. Oneida Alar: 12 colon polyps removed, multiple simple adenomas and some inflammatory polyps, diverticulosis, external and internal hemorrhoids.  Next colonoscopy in 3 years.   ESOPHAGEAL BANDING N/A 09/12/2019   Procedure: ESOPHAGEAL BANDING;  Surgeon: Daneil Dolin, MD;  [facility];  Service: Endoscopy;  Laterality: N/A;   ESOPHAGOGASTRODUODENOSCOPY N/A 09/12/2019   Dr. Gala Romney: Mild erosive reflux esophagitis.  Schatzki ring status post dilation.  Small grade 1/grade 2 esophageal varices, portal hypertensive gastropathy, hyperplastic gastric polyp, gastric erosions with chronic inactive gastritis on biopsies, no H. pylori.   ESOPHAGOGASTRODUODENOSCOPY (EGD) WITH PROPOFOL N/A 12/16/2020   Procedure: ESOPHAGOGASTRODUODENOSCOPY (EGD) WITH PROPOFOL;  Surgeon: Daneil Dolin, MD;  [facility];  Service: Endoscopy;  Laterality: N/A;  8:30am   POLYPECTOMY  09/18/2019   Procedure: POLYPECTOMY;  Surgeon: Danie Binder, MD;  [facility];  Service: Endoscopy;;   POLYPECTOMY  12/16/2020   Procedure: POLYPECTOMY;  Surgeon: Daneil Dolin, MD;  [facility];  Service: Endoscopy;;  gastric    Current Outpatient Medications  Medication Sig Dispense Refill   apixaban (ELIQUIS) 5 MG TABS tablet Take 1 tablet (5 mg total) by mouth 2 (two) times daily. 60 tablet 5   Cholecalciferol (VITAMIN D3) 125 MCG (5000 UT) TABS Take 5,000 Units by mouth daily.     cyclobenzaprine (FLEXERIL) 5 MG tablet Take 5 mg by mouth 2 (two) times daily as needed  for muscle spasms.     Ferrous Sulfate (IRON) 325 (65 Fe) MG TABS Take 1 tablet (325 mg total) by mouth daily. 30 tablet 0   furosemide (LASIX) 20 MG tablet Take 1 tablet (20 mg total) by mouth 2 (two) times daily. 60 tablet 2   gabapentin (NEURONTIN) 300 MG capsule Take 300 mg by mouth as needed (for pain).     lactulose (CHRONULAC) 10 GM/15ML solution Take 10 g by mouth 2 (two) times daily.      levOCARNitine (CARNITOR) 330 MG tablet Take 330 mg by mouth 3 (three) times daily.     Multiple Vitamin (MULTIVITAMIN) tablet Take 1 tablet by mouth daily.     omeprazole (PRILOSEC) 20 MG capsule Take 1 capsule (20 mg total) by mouth daily. 30 capsule 5   rifaximin (XIFAXAN) 550 MG TABS tablet Take 1 tablet (550 mg total) by mouth 2 (two) times daily. 60 tablet 3   spironolactone (ALDACTONE) 100 MG tablet Take 100 mg by mouth 2 (two) times daily.     No current facility-administered medications for this visit.   Allergies:  Chlorthalidone and Metoprolol   Social History: The patient  reports that he has never smoked. He has never used smokeless tobacco. He reports that he does not drink alcohol and does not use drugs.   Family History: The patient's family history includes Brain cancer in his mother; Diabetes in his father and mother; Pulmonary embolism in his brother.   ROS: Intermittent leg swelling.  Forgetfulness.  Physical Exam: VS:  BP 118/68   Pulse 92   Ht 5' 8"  (1.727 m)   Wt 217 lb 12.8 oz (98.8 kg)   SpO2 98%   BMI 33.12 kg/m , BMI Body mass index is 33.12 kg/m.  Wt Readings from Last 3 Encounters:  12/28/20 217 lb 12.8 oz (98.8 kg)  12/13/20 219 lb (99.3 kg)  10/26/20 227 lb 12.8 oz (103.3 kg)    General: Chronically ill-appearing male, no distress. HEENT: Conjunctiva and lids normal, wearing a mask. Neck: Supple, no elevated JVP or carotid bruits, no thyromegaly. Lungs: Clear to auscultation, nonlabored breathing at rest. Cardiac: Regular rate and rhythm, no S3, 1/6 systolic murmur, no pericardial rub. Abdomen: Protuberant but soft, bowel sounds present, no guarding or rebound. Extremities: 1+ leg edema, distal pulses 2+. Skin: Warm and dry. Musculoskeletal: No kyphosis. Neuropsychiatric: Alert and oriented x3, affect grossly appropriate.  ECG:  An ECG dated 12/13/2020 was personally reviewed today and demonstrated:  Sinus rhythm with increased voltage.  Recent  Labwork: 09/15/2020: Magnesium 2.0 10/11/2020: B Natriuretic Peptide 10.0 12/07/2020: Hemoglobin 11.1; Platelets 139 12/13/2020: ALT 24; AST 24 12/15/2020: BUN 22; Creatinine, Ser 1.20; Potassium 4.9; Sodium 122   Other Studies Reviewed Today:  Echocardiogram 09/10/2019:  1. Left ventricular ejection fraction, by estimation, is 60 to 65%. The  left ventricle has normal function. The left ventricle has no regional  wall motion abnormalities. Left ventricular diastolic parameters are  indeterminate.   2. Right ventricular systolic function is normal. The right ventricular  size is normal.   3. The mitral valve was not well visualized. No evidence of mitral valve  regurgitation. No evidence of mitral stenosis.   4. The aortic valve was not well visualized. Aortic valve regurgitation  is not visualized. No aortic stenosis is present.   Assessment and Plan:  1.  Sense of orthopnea and chest fullness as discussed above.  Weight is down and his last paracentesis was in May with history of  cirrhosis as discussed above.  I reviewed his recent lab work.  ECG showed sinus rhythm with increased voltage.  We will plan a follow-up echocardiogram to reevaluate cardiac structure and function in comparison to study from last year.  2.  Intermittent palpitations with elevated heart rate noted by Fitbit.  We will plan a 7-day Zio patch to exclude paroxysmal arrhythmia.  3.  Cirrhosis with portal hypertension and esophageal varices as well as portal vein thrombosis on Eliquis.  He follows with gastroenterology.  4.  OSA with plan for CPAP and followed by Dr. Elsworth Soho.  5.  History of thrombocytopenia followed by Dr. Delton Coombes.  Recent platelet count 139.  Medication Adjustments/Labs and Tests Ordered: Current medicines are reviewed at length with the patient today.  Concerns regarding medicines are outlined above.   Tests Ordered: Orders Placed This Encounter  Procedures   ECHOCARDIOGRAM COMPLETE      Medication Changes: No orders of the defined types were placed in this encounter.   Disposition:  Follow up  test results.  Signed, Satira Sark, MD, [facility] 12/28/2020 1:46 PM    [facility] [place], [place], [place] [address] Phone: [phone]; Fax: [phone]

## 2020-12-28 NOTE — Telephone Encounter (Signed)
Checking percert on the following patient for testing scheduled at [facility].     ECHO  12/31/2020  7 DAY ZIO MONITOR

## 2020-12-28 NOTE — Telephone Encounter (Signed)
Spoke with the pt and notified of results of sleep study. Pt verbalized understanding and was agreeable to CPAP therapy. I have placed DME referral for this and pt aware to contact the office for 31-90 day f/u once they begin using machine per insurance requirement.

## 2020-12-28 NOTE — Patient Instructions (Signed)
Medication Instructions:  Your physician recommends that you continue on your current medications as directed. Please refer to the Current Medication list given to you today.  Labwork: none  Testing/Procedures: Your physician has requested that you have an echocardiogram. Echocardiography is a painless test that uses sound waves to create images of your heart. It provides your doctor with information about the size and shape of your heart and how well your heart's chambers and valves are working. This procedure takes approximately one hour. There are no restrictions for this procedure. ZIO- Long Term Monitor Instructions   Your physician has requested you wear your ZIO patch monitor 7 days.   This is a single patch monitor.  [employer] supplies one patch monitor per enrollment.  Additional stickers are not available.   Please do not apply patch if you will be having a Nuclear Stress Test, Echocardiogram, Cardiac CT, MRI, or Chest Xray during the time frame you would be wearing the monitor. The patch cannot be worn during these tests.  You cannot remove and re-apply the ZIO XT patch monitor.   Your ZIO patch monitor will be sent USPS Priority mail from [facility] directly to your home address. The monitor may also be mailed to a PO BOX if home delivery is not available.   It may take 3-5 days to receive your monitor after you have been enrolled.   Once you have received you monitor, please review enclosed instructions.  Your monitor has already been registered assigning a specific monitor serial # to you.   Applying the monitor   Shave hair from upper left chest.   Hold abrader disc by orange tab.  Rub abrader in 40 strokes over left upper chest as indicated in your monitor instructions.   Clean area with 4 enclosed alcohol pads .  Use all pads to assure are is cleaned thoroughly.  Let dry.   Apply patch as indicated in monitor instructions.  Patch will be place under collarbone on  left side of chest with arrow pointing upward.   Rub patch adhesive wings for 2 minutes.Remove white label marked "1".  Remove white label marked "2".  Rub patch adhesive wings for 2 additional minutes.   While looking in a mirror, press and release button in center of patch.  A small green light will flash 3-4 times .  This will be your only indicator the monitor has been turned on.     Do not shower for the first 24 hours.  You may shower after the first 24 hours.   Press button if you feel a symptom. You will hear a small click.  Record Date, Time and Symptom in the Patient Log Book.   When you are ready to remove patch, follow instructions on last 2 pages of Patient Log Book.  Stick patch monitor onto last page of Patient Log Book.   Place Patient Log Book in [place] box.  Use locking tab on box and tape box closed securely.  The [employer] and [employer] has [employer] on it.  Please place in mailbox as soon as possible.  Your physician should have your test results approximately 7 days after the monitor has been mailed back to [facility].   Call [facility] [place] at [phone] if you have questions regarding your ZIO XT patch monitor.  Call them immediately if you see an orange light blinking on your monitor.   If your monitor falls off in less than 4 days contact our  Monitor department at [phone].  If your monitor becomes loose or falls off after 4 days call [employer] at [phone] for suggestions on securing your monitor.  Follow-Up: Your physician recommends that you schedule a follow-up appointment in: pending  Any Other Special Instructions Will Be Listed Below (If Applicable).  If you need a refill on your cardiac medications before your next appointment, please call your pharmacy.

## 2020-12-31 ENCOUNTER — Ambulatory Visit (HOSPITAL_COMMUNITY)
Admission: RE | Admit: 2020-12-31 | Discharge: 2020-12-31 | Disposition: A | Discharge status: Home / Self Care | Payer: Medicaid Other | Source: Ambulatory Visit | Attending: Cardiology | Admitting: Cardiology

## 2020-12-31 ENCOUNTER — Other Ambulatory Visit: Payer: Self-pay

## 2020-12-31 DIAGNOSIS — R0602 Shortness of breath: Secondary | ICD-10-CM | POA: Insufficient documentation

## 2020-12-31 DIAGNOSIS — R002 Palpitations: Secondary | ICD-10-CM

## 2020-12-31 LAB — ECHOCARDIOGRAM COMPLETE
AR max vel: 4.03 cm2
AV Area VTI: 3.76 cm2
AV Area mean vel: 4.07 cm2
AV Mean grad: 3 mmHg
AV Peak grad: 5 mmHg
Ao pk vel: 1.12 m/s
Area-P 1/2: 3.4 cm2
S' Lateral: 3.6 cm

## 2020-12-31 NOTE — Progress Notes (Signed)
  Echocardiogram 2D Echocardiogram has been performed.  Alejandro Porter 12/31/2020, 8:51 AM

## 2021-01-07 ENCOUNTER — Telehealth: Payer: Self-pay | Admitting: *Deleted

## 2021-01-07 NOTE — Telephone Encounter (Signed)
-----   Message from Satira Sark, MD sent at 01/02/2021  7:46 PM EDT ----- Results reviewed.  LVEF remains normal at 55 to 60%, also normal RV contraction and estimated pulmonary artery systolic pressure.  No significant valvular abnormalities.  Await results of cardiac monitor.

## 2021-01-07 NOTE — Telephone Encounter (Signed)
Patient informed. Copy sent to PCP °

## 2021-01-12 ENCOUNTER — Encounter: Payer: Self-pay | Admitting: Internal Medicine

## 2021-01-17 ENCOUNTER — Telehealth: Payer: Self-pay | Admitting: Internal Medicine

## 2021-01-17 ENCOUNTER — Telehealth: Payer: Self-pay | Admitting: *Deleted

## 2021-01-17 DIAGNOSIS — R109 Unspecified abdominal pain: Secondary | ICD-10-CM

## 2021-01-17 DIAGNOSIS — R110 Nausea: Secondary | ICD-10-CM

## 2021-01-17 MED ORDER — ONDANSETRON HCL 4 MG PO TABS: 4.0000 mg | ORAL_TABLET | Freq: Four times a day (QID) | ORAL | 1 refills | Status: DC | PRN

## 2021-01-17 NOTE — Telephone Encounter (Signed)
Pt needs to speak with nurse. [phone]

## 2021-01-17 NOTE — Telephone Encounter (Signed)
Patient was told he needed appointment this week.  Is there somewhere you have a slot to open so you can see him?  He is on the schedule to see you in December and on a cancellation list

## 2021-01-17 NOTE — Addendum Note (Signed)
Addended by: Mahala Menghini on: 01/17/2021 01:34 PM   Modules accepted: Orders

## 2021-01-17 NOTE — Telephone Encounter (Signed)
-----   Message from Satira Sark, MD sent at 01/16/2021  1:24 PM EDT ----- Results reviewed.  Cardiac monitor was reassuring showing normal sinus rhythm.  There were rare PACs and PVCs including rare ventricular couplets, but importantly no sustained arrhythmias which was the main question.  At this point would not anticipate further cardiac work-up unless symptoms worsen.  Keep follow-up with PCP.

## 2021-01-17 NOTE — Telephone Encounter (Signed)
Pt returned call and states that he only has abd pain after he eats and does not feel like he has fluid in his belly. Pt denies having any black or bloody stools or fever but has been instructed to go to the er if they develop. Pt was transferred to the front to schedule an appt. Pt would like Zofran to be called in for the nausea to the pharmacy on file.

## 2021-01-17 NOTE — Telephone Encounter (Addendum)
Last ov 09/2020. EGD 12/2020 reviewed.  -->Please ask if he feels like he has a lot of fluid in the belly or any abdominal pain. Does he feel like he needs fluid drawn off.  -->He needs an office visit this week or next.  -->I can send in some zofran to help with symptoms but we need to determine cause of him symptoms.   -->If he has black or bloody stools, fever, cannot keep fluids down he should go to er.

## 2021-01-17 NOTE — Telephone Encounter (Signed)
Lmom for pt to return my call.  

## 2021-01-17 NOTE — Telephone Encounter (Signed)
Pt called stating that he feels like he is going to vomit when he eats after taking his meds. Pt states that it happens in the afternoon and late evening. Pt states that after he finishes eating he starts coughing up phlegm as well. Pt denies any diarrhea but states he does have to strain when having a bm. Pt has no fever or rectal bleeding. Pt states that this has been going on for a month to a month and a half.

## 2021-01-17 NOTE — Telephone Encounter (Signed)
Patient informed. Copy sent to PCP °

## 2021-01-18 ENCOUNTER — Other Ambulatory Visit: Payer: Self-pay | Admitting: Gastroenterology

## 2021-01-18 DIAGNOSIS — R110 Nausea: Secondary | ICD-10-CM

## 2021-01-18 DIAGNOSIS — Z8719 Personal history of other diseases of the digestive system: Secondary | ICD-10-CM

## 2021-01-18 DIAGNOSIS — R1084 Generalized abdominal pain: Secondary | ICD-10-CM

## 2021-01-18 NOTE — Telephone Encounter (Signed)
[place] abd complete scheduled for 01/19/21 at 11:30am, arrive at 11:15am. NPO after midnight. Informed pt of [place] appt.

## 2021-01-18 NOTE — Telephone Encounter (Signed)
He needs to be top priority on cancellation list.

## 2021-01-18 NOTE — Telephone Encounter (Signed)
Ordered Stat labs and lmom for pt to return my call.

## 2021-01-18 NOTE — Telephone Encounter (Signed)
Noted  

## 2021-01-18 NOTE — Telephone Encounter (Signed)
Pt called back stating that he is unable to do labs or [place] today that his mother is in the hospital but should be able to do them tomorrow. Faxed lab orders to [facility] and transferred him to [place] to schedule [place].

## 2021-01-18 NOTE — Addendum Note (Signed)
Addended by: Zara Council C on: 01/18/2021 11:13 AM   Modules accepted: Orders

## 2021-01-18 NOTE — Telephone Encounter (Signed)
Zofran was sent yesterday.  While we are waiting on appt please arrange for him to go for labs today.  Labs needed: CBC, CMET, lipase, PT/INR STAT  Needs Abd u/s today or tomorrow for abdominal pain, nausea, h/o cholelithiasis.

## 2021-01-19 ENCOUNTER — Other Ambulatory Visit (HOSPITAL_COMMUNITY)
Admission: RE | Admit: 2021-01-19 | Discharge: 2021-01-19 | Disposition: A | Discharge status: Home / Self Care | Payer: Medicaid Other | Source: Ambulatory Visit | Attending: Gastroenterology | Admitting: Gastroenterology

## 2021-01-19 ENCOUNTER — Other Ambulatory Visit: Payer: Self-pay

## 2021-01-19 ENCOUNTER — Ambulatory Visit (HOSPITAL_COMMUNITY)
Admission: RE | Admit: 2021-01-19 | Discharge: 2021-01-19 | Disposition: A | Discharge status: Home / Self Care | Payer: Medicaid Other | Source: Ambulatory Visit | Attending: Gastroenterology | Admitting: Gastroenterology

## 2021-01-19 DIAGNOSIS — R110 Nausea: Secondary | ICD-10-CM | POA: Insufficient documentation

## 2021-01-19 DIAGNOSIS — R1084 Generalized abdominal pain: Secondary | ICD-10-CM | POA: Insufficient documentation

## 2021-01-19 DIAGNOSIS — Z8719 Personal history of other diseases of the digestive system: Secondary | ICD-10-CM | POA: Insufficient documentation

## 2021-01-19 DIAGNOSIS — R109 Unspecified abdominal pain: Secondary | ICD-10-CM | POA: Insufficient documentation

## 2021-01-19 LAB — CBC WITH DIFFERENTIAL/PLATELET
Abs Immature Granulocytes: 0.04 10*3/uL (ref 0.00–0.07)
Basophils Absolute: 0 10*3/uL (ref 0.0–0.1)
Basophils Relative: 1 %
Eosinophils Absolute: 0.1 10*3/uL (ref 0.0–0.5)
Eosinophils Relative: 1 %
HCT: 35.8 % — ABNORMAL LOW (ref 39.0–52.0)
Hemoglobin: 11.8 g/dL — ABNORMAL LOW (ref 13.0–17.0)
Immature Granulocytes: 1 %
Lymphocytes Relative: 6 %
Lymphs Abs: 0.4 10*3/uL — ABNORMAL LOW (ref 0.7–4.0)
MCH: 31.7 pg (ref 26.0–34.0)
MCHC: 33 g/dL (ref 30.0–36.0)
MCV: 96.2 fL (ref 80.0–100.0)
Monocytes Absolute: 0.7 10*3/uL (ref 0.1–1.0)
Monocytes Relative: 10 %
Neutro Abs: 5.4 10*3/uL (ref 1.7–7.7)
Neutrophils Relative %: 81 %
Platelets: 118 10*3/uL — ABNORMAL LOW (ref 150–400)
RBC: 3.72 MIL/uL — ABNORMAL LOW (ref 4.22–5.81)
RDW: 13.9 % (ref 11.5–15.5)
WBC: 6.6 10*3/uL (ref 4.0–10.5)
nRBC: 0 % (ref 0.0–0.2)

## 2021-01-19 LAB — COMPREHENSIVE METABOLIC PANEL
ALT: 30 U/L (ref 0–44)
AST: 28 U/L (ref 15–41)
Albumin: 3.7 g/dL (ref 3.5–5.0)
Alkaline Phosphatase: 81 U/L (ref 38–126)
Anion gap: 5 (ref 5–15)
BUN: 24 mg/dL — ABNORMAL HIGH (ref 6–20)
CO2: 26 mmol/L (ref 22–32)
Calcium: 8.7 mg/dL — ABNORMAL LOW (ref 8.9–10.3)
Chloride: 93 mmol/L — ABNORMAL LOW (ref 98–111)
Creatinine, Ser: 1.33 mg/dL — ABNORMAL HIGH (ref 0.61–1.24)
GFR, Estimated: >60 mL/min (ref >60)
Glucose, Bld: 95 mg/dL (ref 70–99)
Potassium: 5.3 mmol/L — ABNORMAL HIGH (ref 3.5–5.1)
Sodium: 124 mmol/L — ABNORMAL LOW (ref 135–145)
Total Bilirubin: 0.9 mg/dL (ref 0.3–1.2)
Total Protein: 7.7 g/dL (ref 6.5–8.1)

## 2021-01-19 LAB — PROTIME-INR
INR: 1.2 (ref 0.8–1.2)
Prothrombin Time: 15.4 seconds — ABNORMAL HIGH (ref 11.4–15.2)

## 2021-01-19 LAB — LIPASE, BLOOD: Lipase: 55 U/L — ABNORMAL HIGH (ref 11–51)

## 2021-01-25 ENCOUNTER — Other Ambulatory Visit: Payer: Self-pay

## 2021-01-25 ENCOUNTER — Inpatient Hospital Stay (HOSPITAL_COMMUNITY): Payer: Medicaid Other | Attending: Hematology

## 2021-01-25 DIAGNOSIS — D508 Other iron deficiency anemias: Secondary | ICD-10-CM

## 2021-01-25 DIAGNOSIS — E876 Hypokalemia: Secondary | ICD-10-CM | POA: Insufficient documentation

## 2021-01-25 DIAGNOSIS — D696 Thrombocytopenia, unspecified: Secondary | ICD-10-CM | POA: Diagnosis not present

## 2021-01-25 DIAGNOSIS — E875 Hyperkalemia: Secondary | ICD-10-CM

## 2021-01-25 LAB — CBC WITH DIFFERENTIAL/PLATELET
Abs Immature Granulocytes: 0.03 10*3/uL (ref 0.00–0.07)
Basophils Absolute: 0 10*3/uL (ref 0.0–0.1)
Basophils Relative: 0 %
Eosinophils Absolute: 0.1 10*3/uL (ref 0.0–0.5)
Eosinophils Relative: 2 %
HCT: 31.2 % — ABNORMAL LOW (ref 39.0–52.0)
Hemoglobin: 10.5 g/dL — ABNORMAL LOW (ref 13.0–17.0)
Immature Granulocytes: 1 %
Lymphocytes Relative: 7 %
Lymphs Abs: 0.4 10*3/uL — ABNORMAL LOW (ref 0.7–4.0)
MCH: 32.4 pg (ref 26.0–34.0)
MCHC: 33.7 g/dL (ref 30.0–36.0)
MCV: 96.3 fL (ref 80.0–100.0)
Monocytes Absolute: 0.5 10*3/uL (ref 0.1–1.0)
Monocytes Relative: 8 %
Neutro Abs: 5.1 10*3/uL (ref 1.7–7.7)
Neutrophils Relative %: 82 %
Platelets: 121 10*3/uL — ABNORMAL LOW (ref 150–400)
RBC: 3.24 MIL/uL — ABNORMAL LOW (ref 4.22–5.81)
RDW: 14.1 % (ref 11.5–15.5)
WBC: 6.1 10*3/uL (ref 4.0–10.5)
nRBC: 0 % (ref 0.0–0.2)

## 2021-01-25 LAB — IRON AND TIBC
Iron: 128 ug/dL (ref 45–182)
Saturation Ratios: 30 % (ref 17.9–39.5)
TIBC: 421 ug/dL (ref 250–450)
UIBC: 293 ug/dL

## 2021-01-25 LAB — FERRITIN: Ferritin: 22 ng/mL — ABNORMAL LOW (ref 24–336)

## 2021-01-26 ENCOUNTER — Ambulatory Visit: Payer: Medicaid Other | Admitting: Gastroenterology

## 2021-01-26 ENCOUNTER — Encounter: Payer: Self-pay | Admitting: Cardiology

## 2021-01-26 ENCOUNTER — Encounter: Payer: Self-pay | Admitting: Gastroenterology

## 2021-01-26 VITALS — BP 118/73 | HR 84 | Temp 97.0°F | Ht 68.0 in | Wt 244.0 lb

## 2021-01-26 DIAGNOSIS — K7460 Unspecified cirrhosis of liver: Secondary | ICD-10-CM

## 2021-01-26 DIAGNOSIS — R188 Other ascites: Secondary | ICD-10-CM | POA: Diagnosis not present

## 2021-01-26 NOTE — Patient Instructions (Signed)
Please complete blood work Architectural technologist.  I am looking into the antibiotics for you.  I am also reaching out to Pulmonology regarding your sleep apnea machine  We will see you in 3-4 months!  I enjoyed seeing you again today! As you know, I value our relationship and want to provide genuine, compassionate, and quality care. I welcome your feedback. If you receive a survey regarding your visit,  I greatly appreciate you taking time to fill this out. See you next time!  Annitta Needs, PhD, ANP-BC [facility]

## 2021-01-26 NOTE — Progress Notes (Signed)
Referring Provider: Sofie Rower, PA-C Primary Care Physician:  Sofie Rower, PA-C Primary GI: Dr. Gala Romney   Chief Complaint  Patient presents with   Follow-up    Nauseated when eating, staining when trying to have a bowel movement     HPI:   Alejandro Porter is a 45 y.o. male presenting today with a history of cirrhosis complicated by portal hypertension, esophageal varices, portal vein thrombosis on Eliquis, hepatic encephalopathy on lactulose and Xfiaxan, ascites requiring repeat paras, history of SBP in April 2022, undergoing liver transplant eval at [facility]. Regarding candidacy, he needs to demonstrate adequate support persons. Last EGD July 2022 with EGD surveillance due in 18 months. Two short columns of no more than Grade 2 esophageal varices that appeared innocent, multiple large posterior body and antral hyperplastic, hemorrhagic appearing polyps, larges approximately 2.5 cm pedunculated. Portal gastropathy. Normal duodenum. Polypectomy with clips placed. Not a candidate for nonselective beta-blocker with history of SBP.   He is not taking Cipro any longer as SBP prophylaxis any longer for unclear reasons. Called in with nausea a few weeks ago. Labs updated and over stable from prior with chronic hyponatremia. Potassium mildly elevated. He was to hold lasix and aldactone for 3 days then resume. Will need to update labs now. US abdomen completed Jan 19, 2021 with cirrhosis, perihepatic ascites, and left pleural effusion.   Diuretics: lasix 20 mg BID, aldactone 10 mg BID   Transplant follow-up in November. Has had issues with back pain. Lower back pain. Hurting off and on. When eating, sometimes his stomach will feel uncomfortable.  Makes his stomach feel bad with eating. Just once in awhile. Some nausea at times. Omeprazole daily he believes. Feels sick and drained in the afternoon. Not nauseated. Just an "eerie" feeling. Has seen cardiology. Sometimes when  sitting still, his pulse will jump to 120. Took a sleep study and had sleep apnea. He states insurance denied machine.    Past Medical History:  Diagnosis Date   CKD (chronic kidney disease) stage 2, GFR 60-89 ml/min    H/O colonoscopy    H/O endoscopy    Hypertension    Liver cirrhosis (HCC)    Neuropathy    OSA (obstructive sleep apnea)    S/P abdominal paracentesis     Past Surgical History:  Procedure Laterality Date   BIOPSY  09/12/2019   Procedure: BIOPSY;  Surgeon: Daneil Dolin, MD;  [facility];  Service: Endoscopy;;   COLONOSCOPY N/A 09/18/2019   Dr. Oneida Alar: 12 colon polyps removed, multiple simple adenomas and some inflammatory polyps, diverticulosis, external and internal hemorrhoids.  Next colonoscopy in 3 years.   ESOPHAGEAL BANDING N/A 09/12/2019   Procedure: ESOPHAGEAL BANDING;  Surgeon: Daneil Dolin, MD;  [facility];  Service: Endoscopy;  Laterality: N/A;   ESOPHAGOGASTRODUODENOSCOPY N/A 09/12/2019   Dr. Gala Romney: Mild erosive reflux esophagitis.  Schatzki ring status post dilation.  Small grade 1/grade 2 esophageal varices, portal hypertensive gastropathy, hyperplastic gastric polyp, gastric erosions with chronic inactive gastritis on biopsies, no H. pylori.   ESOPHAGOGASTRODUODENOSCOPY (EGD) WITH PROPOFOL N/A 12/16/2020   two short columns of no more than Grade 2 esophageal varices, appearing innocent. Grade 1 varices not apparent today. Multiple large posterior body and antral hyperplastic, hemorrhagic appearing polyps, larges approximately 2.5 cm pedunculated. Portal gastropathy. Normal duodenum. Polypectomy with clips placed. EGD in 18 months.   POLYPECTOMY  09/18/2019   Procedure: POLYPECTOMY;  Surgeon: Danie Binder, MD;  [facility];  Service: Endoscopy;;   POLYPECTOMY  12/16/2020   Procedure: POLYPECTOMY;  Surgeon: Daneil Dolin, MD;  [facility];  Service: Endoscopy;;  gastric    Current Outpatient  Medications  Medication Sig Dispense Refill   apixaban (ELIQUIS) 5 MG TABS tablet Take 1 tablet (5 mg total) by mouth 2 (two) times daily. 60 tablet 5   Cholecalciferol (VITAMIN D3) 125 MCG (5000 UT) TABS Take 5,000 Units by mouth daily.     cyclobenzaprine (FLEXERIL) 5 MG tablet Take 5 mg by mouth 2 (two) times daily as needed for muscle spasms. (Patient not taking: Reported on 02/01/2021)     Ferrous Sulfate (IRON) 325 (65 Fe) MG TABS Take 1 tablet (325 mg total) by mouth daily. 30 tablet 0   furosemide (LASIX) 20 MG tablet Take 1 tablet (20 mg total) by mouth 2 (two) times daily. 60 tablet 2   gabapentin (NEURONTIN) 300 MG capsule Take 300 mg by mouth as needed (for pain).     levOCARNitine (CARNITOR) 330 MG tablet Take 330 mg by mouth 3 (three) times daily.     Multiple Vitamin (MULTIVITAMIN) tablet Take 1 tablet by mouth daily.     omeprazole (PRILOSEC) 20 MG capsule Take 1 capsule (20 mg total) by mouth daily. 30 capsule 5   ondansetron (ZOFRAN) 4 MG tablet Take 1 tablet (4 mg total) by mouth every 6 (six) hours as needed for nausea or vomiting. (Patient not taking: Reported on 02/01/2021) 30 tablet 1   rifaximin (XIFAXAN) 550 MG TABS tablet Take 1 tablet (550 mg total) by mouth 2 (two) times daily. 60 tablet 3   spironolactone (ALDACTONE) 100 MG tablet Take 100 mg by mouth 2 (two) times daily.     CONSTULOSE 10 GM/15ML solution take 15 ML BY MOUTH EVERY DAY 946 mL 2   No current facility-administered medications for this visit.    Allergies as of 01/26/2021 - Review Complete 01/26/2021  Allergen Reaction Noted   Chlorthalidone  11/04/2019   Metoprolol  11/04/2019    Family History  Problem Relation Age of Onset   Brain cancer Mother    Diabetes Mother    Diabetes Father    Pulmonary embolism Brother    Liver disease Neg Hx     Social History   Socioeconomic History   Marital status: Single    Spouse name: Not on file   Number of children: Not on file   Years of education:  Not on file   Highest education level: Not on file  Occupational History   Occupation: umemployed  Tobacco Use   Smoking status: Never   Smokeless tobacco: Never  Vaping Use   Vaping Use: Never used  Substance and Sexual Activity   Alcohol use: Never   Drug use: Never   Sexual activity: Not Currently  Other Topics Concern   Not on file  Social History Narrative   Not on file   Social Determinants of Health   Financial Resource Strain: Not on file  Food Insecurity: Not on file  Transportation Needs: Not on file  Physical Activity: Not on file  Stress: Not on file  Social Connections: Not on file    Review of Systems: Gen: Denies fever, chills, anorexia. Denies fatigue, weakness, weight loss.  CV: Denies chest pain, palpitations, syncope, peripheral edema, and claudication. Resp: Denies dyspnea at rest, cough, wheezing, coughing up blood, and pleurisy. GI: see HPI Derm: Denies rash, itching, dry skin Psych: Denies  depression, anxiety, memory loss, confusion. No homicidal or suicidal ideation.  Heme: Denies bruising, bleeding, and enlarged lymph nodes.  Physical Exam: BP 118/73   Pulse 84   Temp (!) 97 F (36.1 C) (Temporal)   Ht 5' 8"  (1.727 m)   Wt 244 lb (110.7 kg)   BMI 37.10 kg/m  General:   Alert and oriented. No distress noted. Pleasant and cooperative.  Head:  Normocephalic and atraumatic. Eyes:  Conjuctiva clear without scleral icterus. Mouth:  mask in place Abdomen:  +BS, soft, non-tender and non-distended. No rebound or guarding. No HSM or masses noted. Msk:  Symmetrical without gross deformities. Normal posture. Extremities:  Without edema. Neurologic:  Alert and  oriented x4 Psych:  Alert and cooperative. Normal mood and affect.  ASSESSMENT: Alejandro Porter is a 45 y.o. male presenting today with a history of cirrhosis complicated by portal hypertension, esophageal varices, portal vein thrombosis on Eliquis, hepatic encephalopathy on lactulose and  Xfiaxan, ascites requiring repeat paras, history of SBP in April 2022, undergoing liver transplant eval at [facility]. He is here today in routine follow-up.  Cirrhosis: recent labs with MELD Na 22. Potassium mildly elevated and will be rechecked today. Creatinine slightly bumped to 1.33 but appears to be in the range of 1.2-1.3. Continue to follow-up with transplant. [place] due in 6 months.   Esophageal varices: not a candidate for non-selective blocker after history of SBP. Next EGD due in Jan 2024.  History of SBP: after review of labs, will resume Cipro 500 mg orally daily for prophylactic purposes. Continue to monitor renal function.   Nausea: unclear etiology. Possibly dietary-related. Discussed keeping food journal  Fatigue: notes he has a history of sleep apnea but was denied the machine. I will reach out to Pulmonology regarding this.     PLAN:  Repeat BMP Resume Cipro 500 mg daily for SBP prophylaxis EGD in Jan 2024 Colonoscopy for surveillance in April 2024 (can do EGD at same time) Continue diuretic therapy [place] in 6 months Return in 3-4 months Reaching out to Pulmonology regarding sleep apnea  Annitta Needs, PhD, ANP-BC [facility]

## 2021-01-27 ENCOUNTER — Other Ambulatory Visit (HOSPITAL_COMMUNITY)
Admission: RE | Admit: 2021-01-27 | Discharge: 2021-01-27 | Disposition: A | Discharge status: Home / Self Care | Payer: Medicaid Other | Source: Ambulatory Visit | Attending: Gastroenterology | Admitting: Gastroenterology

## 2021-01-27 ENCOUNTER — Other Ambulatory Visit: Payer: Self-pay

## 2021-01-27 DIAGNOSIS — K7460 Unspecified cirrhosis of liver: Secondary | ICD-10-CM | POA: Insufficient documentation

## 2021-01-27 DIAGNOSIS — R188 Other ascites: Secondary | ICD-10-CM | POA: Diagnosis present

## 2021-01-27 LAB — BASIC METABOLIC PANEL
Anion gap: 8 (ref 5–15)
BUN: 20 mg/dL (ref 6–20)
CO2: 24 mmol/L (ref 22–32)
Calcium: 9 mg/dL (ref 8.9–10.3)
Chloride: 95 mmol/L — ABNORMAL LOW (ref 98–111)
Creatinine, Ser: 1.22 mg/dL (ref 0.61–1.24)
GFR, Estimated: >60 mL/min (ref >60)
Glucose, Bld: 135 mg/dL — ABNORMAL HIGH (ref 70–99)
Potassium: 4.8 mmol/L (ref 3.5–5.1)
Sodium: 127 mmol/L — ABNORMAL LOW (ref 135–145)

## 2021-01-31 NOTE — Progress Notes (Signed)
[name], [place] [id number]   CLINIC:  Medical Oncology/Hematology  PCP:  Sofie Rower, PA-C [address] / [place] [address]  [phone]  REASON FOR VISIT:  Follow-up for thrombocytopenia and hyperkalemia  PRIOR THERAPY: none  CURRENT THERAPY: surveillance  INTERVAL HISTORY:  Alejandro Porter, a 45 y.o. male, returns for routine follow-up for his thrombocytopenia and hyperkalemia. Matan was last seen on 10/25/2020.  Today he reports feeling well. He is not currently taking iron tablets, and he reports increased levels of fatigue. He reports occasional constipation. He denies hematochezia.   REVIEW OF SYSTEMS:  Review of Systems  Constitutional:  Positive for fatigue (40%). Negative for appetite change (75%).  Respiratory:  Positive for cough.   Gastrointestinal:  Positive for constipation and vomiting. Negative for blood in stool.  Neurological:  Positive for numbness (tingling R leg).  All other systems reviewed and are negative.  PAST MEDICAL/SURGICAL HISTORY:  Past Medical History:  Diagnosis Date   CKD (chronic kidney disease) stage 2, GFR 60-89 ml/min    H/O colonoscopy    H/O endoscopy    Hypertension    Liver cirrhosis (HCC)    Neuropathy    OSA (obstructive sleep apnea)    S/P abdominal paracentesis    Past Surgical History:  Procedure Laterality Date   BIOPSY  09/12/2019   Procedure: BIOPSY;  Surgeon: Daneil Dolin, MD;  [facility];  Service: Endoscopy;;   COLONOSCOPY N/A 09/18/2019   Dr. Oneida Alar: 12 colon polyps removed, multiple simple adenomas and some inflammatory polyps, diverticulosis, external and internal hemorrhoids.  Next colonoscopy in 3 years.   ESOPHAGEAL BANDING N/A 09/12/2019   Procedure: ESOPHAGEAL BANDING;  Surgeon: Daneil Dolin, MD;  [facility];  Service: Endoscopy;  Laterality: N/A;   ESOPHAGOGASTRODUODENOSCOPY N/A 09/12/2019   Dr. Gala Romney: Mild erosive  reflux esophagitis.  Schatzki ring status post dilation.  Small grade 1/grade 2 esophageal varices, portal hypertensive gastropathy, hyperplastic gastric polyp, gastric erosions with chronic inactive gastritis on biopsies, no H. pylori.   ESOPHAGOGASTRODUODENOSCOPY (EGD) WITH PROPOFOL N/A 12/16/2020   Procedure: ESOPHAGOGASTRODUODENOSCOPY (EGD) WITH PROPOFOL;  Surgeon: Daneil Dolin, MD;  [facility];  Service: Endoscopy;  Laterality: N/A;  8:30am   POLYPECTOMY  09/18/2019   Procedure: POLYPECTOMY;  Surgeon: Danie Binder, MD;  [facility];  Service: Endoscopy;;   POLYPECTOMY  12/16/2020   Procedure: POLYPECTOMY;  Surgeon: Daneil Dolin, MD;  [facility];  Service: Endoscopy;;  gastric    SOCIAL HISTORY:  Social History   Socioeconomic History   Marital status: Single    Spouse name: Not on file   Number of children: Not on file   Years of education: Not on file   Highest education level: Not on file  Occupational History   Occupation: umemployed  Tobacco Use   Smoking status: Never   Smokeless tobacco: Never  Vaping Use   Vaping Use: Never used  Substance and Sexual Activity   Alcohol use: Never   Drug use: Never   Sexual activity: Not Currently  Other Topics Concern   Not on file  Social History Narrative   Not on file   Social Determinants of Health   Financial Resource Strain: Not on file  Food Insecurity: Not on file  Transportation Needs: Not on file  Physical Activity: Not on file  Stress: Not on file  Social Connections: Not  on file  Intimate Partner Violence: Not on file    FAMILY HISTORY:  Family History  Problem Relation Age of Onset   Brain cancer Mother    Diabetes Mother    Diabetes Father    Pulmonary embolism Brother    Liver disease Neg Hx     CURRENT MEDICATIONS:  Current Outpatient Medications  Medication Sig Dispense Refill   apixaban (ELIQUIS) 5 MG TABS tablet Take 1 tablet (5 mg total) by mouth 2  (two) times daily. 60 tablet 5   Cholecalciferol (VITAMIN D3) 125 MCG (5000 UT) TABS Take 5,000 Units by mouth daily.     Ferrous Sulfate (IRON) 325 (65 Fe) MG TABS Take 1 tablet (325 mg total) by mouth daily. 30 tablet 0   furosemide (LASIX) 20 MG tablet Take 1 tablet (20 mg total) by mouth 2 (two) times daily. 60 tablet 2   gabapentin (NEURONTIN) 300 MG capsule Take 300 mg by mouth as needed (for pain).     lactulose (CHRONULAC) 10 GM/15ML solution Take 10 g by mouth 2 (two) times daily.     levOCARNitine (CARNITOR) 330 MG tablet Take 330 mg by mouth 3 (three) times daily.     Multiple Vitamin (MULTIVITAMIN) tablet Take 1 tablet by mouth daily.     rifaximin (XIFAXAN) 550 MG TABS tablet Take 1 tablet (550 mg total) by mouth 2 (two) times daily. 60 tablet 3   spironolactone (ALDACTONE) 100 MG tablet Take 100 mg by mouth 2 (two) times daily.     cyclobenzaprine (FLEXERIL) 5 MG tablet Take 5 mg by mouth 2 (two) times daily as needed for muscle spasms. (Patient not taking: Reported on 02/01/2021)     omeprazole (PRILOSEC) 20 MG capsule Take 1 capsule (20 mg total) by mouth daily. 30 capsule 5   ondansetron (ZOFRAN) 4 MG tablet Take 1 tablet (4 mg total) by mouth every 6 (six) hours as needed for nausea or vomiting. (Patient not taking: Reported on 02/01/2021) 30 tablet 1   No current facility-administered medications for this visit.    ALLERGIES:  Allergies  Allergen Reactions   Chlorthalidone     Severe headaches    Metoprolol     Severe headaches     PHYSICAL EXAM:  Performance status (ECOG): 1 - Symptomatic but completely ambulatory  Vitals:   02/01/21 1527  BP: 130/73  Pulse: 89  Resp: 19  Temp: (!) 96.8 F (36 C)  SpO2: 100%   Wt Readings from Last 3 Encounters:  02/01/21 229 lb 14.4 oz (104.3 kg)  01/26/21 244 lb (110.7 kg)  12/28/20 217 lb 12.8 oz (98.8 kg)   Physical Exam Vitals reviewed.  Constitutional:      Appearance: Normal appearance.  Cardiovascular:      Rate and Rhythm: Normal rate and regular rhythm.     Pulses: Normal pulses.     Heart sounds: Normal heart sounds.  Pulmonary:     Effort: Pulmonary effort is normal.     Breath sounds: Normal breath sounds.  Neurological:     General: No focal deficit present.     Mental Status: He is alert and oriented to person, place, and time.  Psychiatric:        Mood and Affect: Mood normal.        Behavior: Behavior normal.    LABORATORY DATA:  I have reviewed the labs as listed.  CBC Latest Ref Rng & Units 01/25/2021 01/19/2021 12/07/2020  WBC 4.0 - 10.5 K/uL 6.1 6.6  7.1  Hemoglobin 13.0 - 17.0 g/dL 10.5(L) 11.8(L) 11.1(L)  Hematocrit 39.0 - 52.0 % 31.2(L) 35.8(L) 33.3(L)  Platelets 150 - 400 K/uL 121(L) 118(L) 139(L)   CMP Latest Ref Rng & Units 01/27/2021 01/19/2021 12/15/2020  Glucose 70 - 99 mg/dL 135(H) 95 102(H)  BUN 6 - 20 mg/dL 20 24(H) 22(H)  Creatinine 0.61 - 1.24 mg/dL 1.22 1.33(H) 1.20  Sodium 135 - 145 mmol/L 127(L) 124(L) 122(L)  Potassium 3.5 - 5.1 mmol/L 4.8 5.3(H) 4.9  Chloride 98 - 111 mmol/L 95(L) 93(L) 92(L)  CO2 22 - 32 mmol/L 24 26 23   Calcium 8.9 - 10.3 mg/dL 9.0 8.7(L) 8.9  Total Protein 6.5 - 8.1 g/dL - 7.7 -  Total Bilirubin 0.3 - 1.2 mg/dL - 0.9 -  Alkaline Phos 38 - 126 U/L - 81 -  AST 15 - 41 U/L - 28 -  ALT 0 - 44 U/L - 30 -      Component Value Date/Time   RBC 3.24 (L) 01/25/2021 1250   MCV 96.3 01/25/2021 1250   MCH 32.4 01/25/2021 1250   MCHC 33.7 01/25/2021 1250   RDW 14.1 01/25/2021 1250   LYMPHSABS 0.4 (L) 01/25/2021 1250   MONOABS 0.5 01/25/2021 1250   EOSABS 0.1 01/25/2021 1250   BASOSABS 0.0 01/25/2021 1250    DIAGNOSTIC IMAGING:  I have independently reviewed the scans and discussed with the patient. US Abdomen Complete  Result Date: 01/19/2021 CLINICAL DATA:  Abdominal pain.  Nausea.  History of cholelithiasis EXAM: ABDOMEN ULTRASOUND COMPLETE COMPARISON:  08/23/2020 CT FINDINGS: Gallbladder: Partially contracted. No stones or wall  thickening. Sonographic Murphy's sign was not elicited. Common bile duct: Diameter: Not well-visualized Liver: Cirrhosis. No focal liver lesion. Heterogeneous color signal within the portal vein including on 52 may be related to the portal venous hypertension and altered, turbulent flow. IVC: No abnormality visualized. Pancreas: Visualized portion unremarkable. Spleen: Splenomegaly at 16 cm craniocaudal. Right Kidney: Length: 12.3 cm. Echogenicity within normal limits. No mass or hydronephrosis visualized. Left Kidney: Length: 12.8 cm. 8 mm collecting system calculus. Echogenicity within normal limits. No mass or hydronephrosis visualized. Abdominal aorta: No aneurysm visualized. Other findings: Perihepatic ascites.  Left pleural effusion. IMPRESSION: 1. Cirrhosis and portal venous hypertension. 2. Ascites and left pleural effusion. 3. Left nephrolithiasis. Electronically Signed   By: Abigail Miyamoto M.D.   On: 01/19/2021 12:55   LONG TERM MONITOR (3-14 DAYS)  Result Date: 01/16/2021 ZIO XT reviewed.  5 days, 7 hours analyzed.  Predominant rhythm is sinus with heart rate ranging from 60 bpm up to 134 bpm and average heart rate 85 bpm.  There were rare PACs including couplets representing less than 1% total beats.  There were rare PVCs including couplets representing less than 1% total beats.  No significant arrhythmias or pauses.    ASSESSMENT:  1.  Mild to moderate thrombocytopenia: -Mild to moderate thrombocytopenia ranging from 90-1 27 since April 2021. -SPEP was negative.  B12, methylmalonic acid, folic acid were normal.  Hepatitis B  was negative.  H. pylori was negative. -Thrombocytopenia has been intermittent and resolved on recent labs.   2.  Portal vein thrombosis: -Ultrasound Doppler of the liver on 09/11/2019 showed nonocclusive hyperechoic thrombus along the main portal vein wall posteriorly extending into the right portal vein. -He is on Eliquis and is tolerating well.   3.  New onset  cirrhosis and ascites: -CTAP from [facility] on 08/17/2019 showed liver morphology consistent with cirrhosis with no masses.  Enlarged  spleen measuring 22 cm.  Nonocclusive thrombus in the portal vein.  Prominent to mildly enlarged periceliac and gastrohepatic ligament lymph nodes.  3.5 cm left adrenal mass consistent with adenoma.   PLAN:  1.  Mild to moderate thrombocytopenia: - He has thrombocytopenia from splenomegaly. - Platelet count is 121.  No bleeding issues reported.   2.  Portal vein thrombosis: - Continue Eliquis.  No bleeding issues reported.   3.  New onset cirrhosis and ascites: - Continue Lasix and spironolactone.   4.  Normocytic anemia: - He denies any bleeding per rectum or melena. - Labs from 01/25/2021 shows hemoglobin 10.5.  Ferritin is 22. - He took iron pills which caused constipation. - Would recommend Venofer 300 mg x 3 infusions. - RTC 4 months with repeat labs including CBC, ferritin and iron panel.  Orders placed this encounter:  No orders of the defined types were placed in this encounter.    Derek Jack, MD [place] [phone]   I, Thana Ates, am acting as a scribe for Dr. Derek Jack.  I, Derek Jack MD, have reviewed the above documentation for accuracy and completeness, and I agree with the above.

## 2021-02-01 ENCOUNTER — Inpatient Hospital Stay (HOSPITAL_COMMUNITY): Payer: Medicaid Other | Admitting: Hematology

## 2021-02-01 ENCOUNTER — Other Ambulatory Visit: Payer: Self-pay

## 2021-02-01 VITALS — BP 130/73 | HR 89 | Temp 96.8°F | Resp 19 | Wt 229.9 lb

## 2021-02-01 DIAGNOSIS — D649 Anemia, unspecified: Secondary | ICD-10-CM | POA: Insufficient documentation

## 2021-02-01 DIAGNOSIS — D696 Thrombocytopenia, unspecified: Secondary | ICD-10-CM | POA: Diagnosis not present

## 2021-02-01 DIAGNOSIS — D508 Other iron deficiency anemias: Secondary | ICD-10-CM | POA: Diagnosis not present

## 2021-02-01 DIAGNOSIS — E875 Hyperkalemia: Secondary | ICD-10-CM

## 2021-02-01 DIAGNOSIS — D539 Nutritional anemia, unspecified: Secondary | ICD-10-CM | POA: Insufficient documentation

## 2021-02-01 NOTE — Patient Instructions (Addendum)
[place] at [facility] Discharge Instructions  You were seen today by Dr. Delton Coombes. He went over your recent results. You will be scheduled to receive 3 infusions of iron (Venofer). Dr. Delton Coombes will see you back in 4 months for labs and follow up.   Thank you for choosing [place] at [facility] to provide your oncology and hematology care.  To afford each patient quality time with our provider, please arrive at least 15 minutes before your scheduled appointment time.   If you have a lab appointment with the [facility] please come in thru the Main Entrance and check in at the main information desk  You need to re-schedule your appointment should you arrive 10 or more minutes late.  We strive to give you quality time with our providers, and arriving late affects you and other patients whose appointments are after yours.  Also, if you no show three or more times for appointments you may be dismissed from the clinic at the providers discretion.     Again, thank you for choosing [facility].  Our hope is that these requests will decrease the amount of time that you wait before being seen by our physicians.       _____________________________________________________________  Should you have questions after your visit to [facility], please contact our office at [phone] between the hours of 8:00 a.m. and 4:30 p.m.  Voicemails left after 4:00 p.m. will not be returned until the following business day.  For prescription refill requests, have your pharmacy contact our office and allow 72 hours.    Cancer Center Support Programs:   > Cancer Support Group  2nd Tuesday of the month 1pm-2pm, Journey Room

## 2021-02-03 ENCOUNTER — Other Ambulatory Visit (HOSPITAL_COMMUNITY)
Admission: RE | Admit: 2021-02-03 | Discharge: 2021-02-03 | Disposition: A | Discharge status: Home / Self Care | Payer: Medicaid Other | Source: Ambulatory Visit | Attending: Nephrology | Admitting: Nephrology

## 2021-02-03 ENCOUNTER — Other Ambulatory Visit: Payer: Self-pay

## 2021-02-03 DIAGNOSIS — N182 Chronic kidney disease, stage 2 (mild): Secondary | ICD-10-CM | POA: Insufficient documentation

## 2021-02-03 DIAGNOSIS — D638 Anemia in other chronic diseases classified elsewhere: Secondary | ICD-10-CM | POA: Insufficient documentation

## 2021-02-03 DIAGNOSIS — E875 Hyperkalemia: Secondary | ICD-10-CM | POA: Diagnosis present

## 2021-02-03 DIAGNOSIS — E871 Hypo-osmolality and hyponatremia: Secondary | ICD-10-CM | POA: Diagnosis present

## 2021-02-03 LAB — RENAL FUNCTION PANEL
Albumin: 3.7 g/dL (ref 3.5–5.0)
Anion gap: 4 — ABNORMAL LOW (ref 5–15)
BUN: 19 mg/dL (ref 6–20)
CO2: 26 mmol/L (ref 22–32)
Calcium: 8.7 mg/dL — ABNORMAL LOW (ref 8.9–10.3)
Chloride: 94 mmol/L — ABNORMAL LOW (ref 98–111)
Creatinine, Ser: 1.17 mg/dL (ref 0.61–1.24)
GFR, Estimated: >60 mL/min (ref >60)
Glucose, Bld: 77 mg/dL (ref 70–99)
Phosphorus: 3.7 mg/dL (ref 2.5–4.6)
Potassium: 4.9 mmol/L (ref 3.5–5.1)
Sodium: 124 mmol/L — ABNORMAL LOW (ref 135–145)

## 2021-02-03 LAB — CBC
HCT: 34.7 % — ABNORMAL LOW (ref 39.0–52.0)
Hemoglobin: 11.4 g/dL — ABNORMAL LOW (ref 13.0–17.0)
MCH: 32.4 pg (ref 26.0–34.0)
MCHC: 32.9 g/dL (ref 30.0–36.0)
MCV: 98.6 fL (ref 80.0–100.0)
Platelets: 137 10*3/uL — ABNORMAL LOW (ref 150–400)
RBC: 3.52 MIL/uL — ABNORMAL LOW (ref 4.22–5.81)
RDW: 14.2 % (ref 11.5–15.5)
WBC: 6.6 10*3/uL (ref 4.0–10.5)
nRBC: 0 % (ref 0.0–0.2)

## 2021-02-04 LAB — MICROALBUMIN / CREATININE URINE RATIO
Creatinine, Urine: 37.5 mg/dL
Microalb Creat Ratio: <8 mg/g creat (ref 0–29)
Microalb, Ur: <3 ug/mL — ABNORMAL HIGH

## 2021-02-07 ENCOUNTER — Other Ambulatory Visit: Payer: Self-pay | Admitting: Nurse Practitioner

## 2021-02-08 ENCOUNTER — Encounter: Payer: Self-pay | Admitting: Gastroenterology

## 2021-02-08 ENCOUNTER — Telehealth: Payer: Self-pay | Admitting: Gastroenterology

## 2021-02-08 MED ORDER — CIPROFLOXACIN HCL 500 MG PO TABS: 500.0000 mg | ORAL_TABLET | Freq: Every day | ORAL | 5 refills | Status: DC

## 2021-02-08 NOTE — Telephone Encounter (Signed)
Pt was made aware and verbalized understanding.

## 2021-02-08 NOTE — Telephone Encounter (Signed)
Tammy:  Please let patient know that I have sent in Cipro 500 mg to take once daily. This is due to his history of SBP. It is for prophylactic reasons as he is at high risk for recurrence.

## 2021-02-09 ENCOUNTER — Other Ambulatory Visit: Payer: Self-pay

## 2021-02-09 ENCOUNTER — Inpatient Hospital Stay (HOSPITAL_COMMUNITY): Payer: Medicaid Other | Attending: Hematology

## 2021-02-09 VITALS — BP 111/71 | HR 74 | Temp 97.0°F | Resp 18

## 2021-02-09 DIAGNOSIS — D509 Iron deficiency anemia, unspecified: Secondary | ICD-10-CM | POA: Diagnosis not present

## 2021-02-09 DIAGNOSIS — D508 Other iron deficiency anemias: Secondary | ICD-10-CM

## 2021-02-09 MED ORDER — SODIUM CHLORIDE 0.9 % IV SOLN: Freq: Once | INTRAVENOUS | Status: AC

## 2021-02-09 MED ORDER — SODIUM CHLORIDE 0.9 % IV SOLN
300.0000 mg | Freq: Once | INTRAVENOUS | Status: AC
  Administered 2021-02-09: 300 mg via INTRAVENOUS
  Filled 2021-02-09: qty 300

## 2021-02-09 NOTE — Progress Notes (Signed)
Pt presents today for Venofer 300 mg IV iron infusion per provider's order. Vital signs stable and pt voiced no new complaints at this time.  Venofer given today per MD orders. Tolerated infusion without adverse affects. Vital signs stable. No complaints at this time. Discharged from clinic ambulatory in stable condition. Alert and oriented x 3. F/U with [facility] as scheduled.

## 2021-02-09 NOTE — Patient Instructions (Signed)
[facility]  Discharge Instructions: Thank you for choosing [facility] to provide your oncology and hematology care.  If you have a lab appointment with the [facility], please come in thru the Main Entrance and check in at the main information desk.  Wear comfortable clothing and clothing appropriate for easy access to any Portacath or PICC line.   We strive to give you quality time with your provider. You may need to reschedule your appointment if you arrive late (15 or more minutes).  Arriving late affects you and other patients whose appointments are after yours.  Also, if you miss three or more appointments without notifying the office, you may be dismissed from the clinic at the provider's discretion.      For prescription refill requests, have your pharmacy contact our office and allow 72 hours for refills to be completed.    Today you received Venofer IV iron infusion.     BELOW ARE SYMPTOMS THAT SHOULD BE REPORTED IMMEDIATELY: *FEVER GREATER THAN 100.4 F (38 C) OR HIGHER *CHILLS OR SWEATING *NAUSEA AND VOMITING THAT IS NOT CONTROLLED WITH YOUR NAUSEA MEDICATION *UNUSUAL SHORTNESS OF BREATH *UNUSUAL BRUISING OR BLEEDING *URINARY PROBLEMS (pain or burning when urinating, or frequent urination) *BOWEL PROBLEMS (unusual diarrhea, constipation, pain near the anus) TENDERNESS IN MOUTH AND THROAT WITH OR WITHOUT PRESENCE OF ULCERS (sore throat, sores in mouth, or a toothache) UNUSUAL RASH, SWELLING OR PAIN  UNUSUAL VAGINAL DISCHARGE OR ITCHING   Items with * indicate a potential emergency and should be followed up as soon as possible or go to the Emergency Department if any problems should occur.  Please show the CHEMOTHERAPY ALERT CARD or IMMUNOTHERAPY ALERT CARD at check-in to the Emergency Department and triage nurse.  Should you have questions after your visit or need to cancel or reschedule your appointment, please contact [facility]  [phone]  and follow the prompts.  Office hours are 8:00 a.m. to 4:30 p.m. Monday - Friday. Please note that voicemails left after 4:00 p.m. may not be returned until the following business day.  We are closed weekends and major holidays. You have access to a nurse at all times for urgent questions. Please call the main number to the clinic [phone] and follow the prompts.  For any non-urgent questions, you may also contact your provider using MyChart. We now offer e-Visits for anyone 22 and older to request care online for non-urgent symptoms. For details visit [email or web address].   Also download the MyChart app! Go to the app store, search "MyChart", open the app, select [facility], and log in with your MyChart username and password.  Due to Covid, a mask is required upon entering the hospital/clinic. If you do not have a mask, one will be given to you upon arrival. For doctor visits, patients may have 1 support person aged 37 or older with them. For treatment visits, patients cannot have anyone with them due to current Covid guidelines and our immunocompromised population.

## 2021-02-14 ENCOUNTER — Ambulatory Visit: Payer: Medicaid Other | Admitting: Gastroenterology

## 2021-02-15 ENCOUNTER — Ambulatory Visit: Payer: Medicaid Other | Admitting: Nurse Practitioner

## 2021-02-16 ENCOUNTER — Ambulatory Visit: Payer: Medicaid Other | Admitting: Gastroenterology

## 2021-02-17 ENCOUNTER — Encounter (HOSPITAL_COMMUNITY): Payer: Self-pay

## 2021-02-17 ENCOUNTER — Inpatient Hospital Stay (HOSPITAL_COMMUNITY): Payer: Medicaid Other

## 2021-02-17 ENCOUNTER — Other Ambulatory Visit: Payer: Self-pay

## 2021-02-17 VITALS — BP 111/67 | HR 81 | Temp 97.8°F | Resp 18

## 2021-02-17 DIAGNOSIS — D509 Iron deficiency anemia, unspecified: Secondary | ICD-10-CM | POA: Diagnosis not present

## 2021-02-17 DIAGNOSIS — D508 Other iron deficiency anemias: Secondary | ICD-10-CM

## 2021-02-17 MED ORDER — SODIUM CHLORIDE 0.9 % IV SOLN: Freq: Once | INTRAVENOUS | Status: AC

## 2021-02-17 MED ORDER — SODIUM CHLORIDE 0.9 % IV SOLN
300.0000 mg | Freq: Once | INTRAVENOUS | Status: AC
  Administered 2021-02-17: 300 mg via INTRAVENOUS
  Filled 2021-02-17: qty 300

## 2021-02-17 NOTE — Patient Instructions (Signed)
[facility]  Discharge Instructions: Thank you for choosing [facility] to provide your oncology and hematology care.  If you have a lab appointment with the [facility], please come in thru the Main Entrance and check in at the main information desk.  Wear comfortable clothing and clothing appropriate for easy access to any Portacath or PICC line.   We strive to give you quality time with your provider. You may need to reschedule your appointment if you arrive late (15 or more minutes).  Arriving late affects you and other patients whose appointments are after yours.  Also, if you miss three or more appointments without notifying the office, you may be dismissed from the clinic at the provider's discretion.      For prescription refill requests, have your pharmacy contact our office and allow 72 hours for refills to be completed.    Today you received the following Venofer, return as scheduled.   To help prevent nausea and vomiting after your treatment, we encourage you to take your nausea medication as directed.  BELOW ARE SYMPTOMS THAT SHOULD BE REPORTED IMMEDIATELY: *FEVER GREATER THAN 100.4 F (38 C) OR HIGHER *CHILLS OR SWEATING *NAUSEA AND VOMITING THAT IS NOT CONTROLLED WITH YOUR NAUSEA MEDICATION *UNUSUAL SHORTNESS OF BREATH *UNUSUAL BRUISING OR BLEEDING *URINARY PROBLEMS (pain or burning when urinating, or frequent urination) *BOWEL PROBLEMS (unusual diarrhea, constipation, pain near the anus) TENDERNESS IN MOUTH AND THROAT WITH OR WITHOUT PRESENCE OF ULCERS (sore throat, sores in mouth, or a toothache) UNUSUAL RASH, SWELLING OR PAIN  UNUSUAL VAGINAL DISCHARGE OR ITCHING   Items with * indicate a potential emergency and should be followed up as soon as possible or go to the Emergency Department if any problems should occur.  Please show the CHEMOTHERAPY ALERT CARD or IMMUNOTHERAPY ALERT CARD at check-in to the Emergency Department and triage  nurse.  Should you have questions after your visit or need to cancel or reschedule your appointment, please contact [facility] [phone]  and follow the prompts.  Office hours are 8:00 a.m. to 4:30 p.m. Monday - Friday. Please note that voicemails left after 4:00 p.m. may not be returned until the following business day.  We are closed weekends and major holidays. You have access to a nurse at all times for urgent questions. Please call the main number to the clinic [phone] and follow the prompts.  For any non-urgent questions, you may also contact your provider using MyChart. We now offer e-Visits for anyone 13 and older to request care online for non-urgent symptoms. For details visit [email or web address].   Also download the MyChart app! Go to the app store, search "MyChart", open the app, select [facility], and log in with your MyChart username and password.  Due to Covid, a mask is required upon entering the hospital/clinic. If you do not have a mask, one will be given to you upon arrival. For doctor visits, patients may have 1 support person aged 46 or older with them. For treatment visits, patients cannot have anyone with them due to current Covid guidelines and our immunocompromised population.

## 2021-02-17 NOTE — Progress Notes (Signed)
Patient tolerated iron infusion with no complaints voiced. Peripheral IV site clean and dry with good blood return noted before and after infusion. Band aid applied. VSS with discharge and left in satisfactory condition with no s/s of distress noted.

## 2021-02-21 ENCOUNTER — Encounter: Payer: Medicaid Other | Attending: Physician Assistant | Admitting: Nutrition

## 2021-02-21 ENCOUNTER — Other Ambulatory Visit: Payer: Self-pay

## 2021-02-21 ENCOUNTER — Encounter: Payer: Self-pay | Admitting: Nutrition

## 2021-02-21 ENCOUNTER — Telehealth: Payer: Self-pay | Admitting: Gastroenterology

## 2021-02-21 ENCOUNTER — Other Ambulatory Visit (HOSPITAL_COMMUNITY)
Admission: RE | Admit: 2021-02-21 | Discharge: 2021-02-21 | Disposition: A | Discharge status: Home / Self Care | Payer: Medicaid Other | Source: Ambulatory Visit | Attending: Nephrology | Admitting: Nephrology

## 2021-02-21 VITALS — Ht 68.0 in | Wt 232.0 lb

## 2021-02-21 DIAGNOSIS — N182 Chronic kidney disease, stage 2 (mild): Secondary | ICD-10-CM | POA: Insufficient documentation

## 2021-02-21 DIAGNOSIS — E875 Hyperkalemia: Secondary | ICD-10-CM | POA: Insufficient documentation

## 2021-02-21 DIAGNOSIS — D638 Anemia in other chronic diseases classified elsewhere: Secondary | ICD-10-CM | POA: Diagnosis present

## 2021-02-21 DIAGNOSIS — K766 Portal hypertension: Secondary | ICD-10-CM

## 2021-02-21 DIAGNOSIS — E66813 Obesity, class 3: Secondary | ICD-10-CM

## 2021-02-21 DIAGNOSIS — E871 Hypo-osmolality and hyponatremia: Secondary | ICD-10-CM | POA: Diagnosis present

## 2021-02-21 DIAGNOSIS — D508 Other iron deficiency anemias: Secondary | ICD-10-CM

## 2021-02-21 DIAGNOSIS — E440 Moderate protein-calorie malnutrition: Secondary | ICD-10-CM

## 2021-02-21 DIAGNOSIS — R188 Other ascites: Secondary | ICD-10-CM

## 2021-02-21 DIAGNOSIS — K7460 Unspecified cirrhosis of liver: Secondary | ICD-10-CM | POA: Insufficient documentation

## 2021-02-21 LAB — RENAL FUNCTION PANEL
Albumin: 3.7 g/dL (ref 3.5–5.0)
Anion gap: 8 (ref 5–15)
BUN: 24 mg/dL — ABNORMAL HIGH (ref 6–20)
CO2: 25 mmol/L (ref 22–32)
Calcium: 8.5 mg/dL — ABNORMAL LOW (ref 8.9–10.3)
Chloride: 89 mmol/L — ABNORMAL LOW (ref 98–111)
Creatinine, Ser: 1.65 mg/dL — ABNORMAL HIGH (ref 0.61–1.24)
GFR, Estimated: 52 mL/min — ABNORMAL LOW (ref >60)
Glucose, Bld: 92 mg/dL (ref 70–99)
Phosphorus: 4.3 mg/dL (ref 2.5–4.6)
Potassium: 5.3 mmol/L — ABNORMAL HIGH (ref 3.5–5.1)
Sodium: 122 mmol/L — ABNORMAL LOW (ref 135–145)

## 2021-02-21 NOTE — Patient Instructions (Signed)
  Goals Need to eat 3 meals per day and not skip meals Call GI doctor or kidney doctor and ask if Boost or CIB is ok to drink for your nutritional needs. Ask GI doctor about your fluid weight gain. Try some Boost or CIB for calorie and protein intake. Healthy Choice or Smart Ones TV dinner for meals as needed. Eggs, toast and fruit for breakfast. Keep Use Mrs Deliah Boston Keep reading food labels.

## 2021-02-21 NOTE — Telephone Encounter (Signed)
Penny from Nutrition called stating that she seen the patient today and he has gained weight from fluid retention. Pt's weight at today's visit was 232 lbs. Alejandro Porter states that he is not eating and seems to be malnourished. Alejandro Porter left her number in case Alejandro Porter wanted to speak to her regarding the patient. Penny's number is [phone].

## 2021-02-21 NOTE — Progress Notes (Signed)
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 " I only eat 1 meal per day because I don't have an appetite and I keep gaining weight."  Was in the hospital for an infection in his stomach. He taking Cipro 500 mg a day . He is seeing Nephrologist now due to kidney issues and lower sodium levels.  He is going to get some labs done today for his kidney doctor. He notes he is skipping meals due to wanting to lose weight and not hungry most of the time. Only eating 1 main meal per day. Doesn't cook, so eats out with family. He is very very aware of sodium content of foods and avoid salty foods and eats healthy foods. He has cut back on his water intake due to lower sodium level. He has been avoiding potassium foods due to elevated potassium levels, which is effecting his overall nutritional intake.  No current albumin level to determine if his PCM has continued to get worse.  He has gained 15 lbs, potential fluid ? since July 2022. He notes he has not had any parecentesis done lately. He appears to be swelling no longer in his abdomen as much, but now in  his legs, trunk of his body and his arms. Possible third spacing due to malnutrition.  He is on lasix daily and now on Cipro for stomach infection long term. Has loss of appetite. Chronic fatigue and no energy. Increased heart rate could be related to his catabolic state due to insuffient calories, protein and overall nutritional status secondary to his liver failure.  CMP Latest Ref Rng & Units 02/03/2021 01/27/2021 01/19/2021  Glucose 70 - 99 mg/dL 77 135(H) 95  BUN 6 - 20 mg/dL 19 20 24(H)  Creatinine 0.61 - 1.24 mg/dL 1.17 1.22 1.33(H)  Sodium 135 - 145 mmol/L 124(L) 127(L) 124(L)  Potassium 3.5 - 5.1 mmol/L 4.9 4.8 5.3(H)  Chloride 98 - 111 mmol/L 94(L) 95(L) 93(L)  CO2 22 - 32 mmol/L 26 24 26   Calcium 8.9 - 10.3 mg/dL 8.7(L) 9.0 8.7(L)   Total Protein 6.5 - 8.1 g/dL - - 7.7  Total Bilirubin 0.3 - 1.2 mg/dL - - 0.9  Alkaline Phos 38 - 126 U/L - - 81  AST 15 - 41 U/L - - 28  ALT 0 - 44 U/L - - 30   Wt Readings from Last 3 Encounters:  02/21/21 232 lb (105.2 kg)  02/01/21 229 lb 14.4 oz (104.3 kg)  01/26/21 244 lb (110.7 kg)   Ht Readings from Last 3 Encounters:  02/21/21 5' 8"  (1.727 m)  01/26/21 5' 8"  (1.727 m)  12/28/20 5' 8"  (1.727 m)   Body mass index is 35.28 kg/m. @BMIFA @ Facility age limit for growth percentiles is 20 years. Facility age limit for growth percentiles is 20 years.    Preferred Learning Style: Auditory       Visual -not reading. Hands on  Learning Readiness:  Ready Change in progress   MEDICATIONS:   DIETARY INTAKE:  24-hr recall:  B ( AM):  Skipped. V 8 Splash. Snk ( AM):  L ( PM):  skipped.  Strawberies,  D ( PM):  Tomato and cheese sandwich -2,                 And a  Smart One TV dinner. Beverages: water   Usual physical activity: walk some  Estimated energy needs: 1800-2000  calories 225 g carbohydrates 150  g protein 56 g fat  Progress Towards Goal(s):  In progress.   Nutritional Diagnosis:  NB-1.1 Food and nutrition-related knowledge deficit As related to Cirrhosis NASH.  As evidenced by Ascites.    Intervention:  Nutrition Cirrhosis education provided on My Plate, CHO counting, meal planning, portion sizes, timing of meals, Low salt diet, Cirrhosis Nutrition Therapy. Need for daily weights and calling MD with fluid weight gain. Low Potassium foods.   Goals Need to eat 3 meals per day and not skip meals Call GI doctor or kidney doctor and ask if Boost or CIB is ok to drink for your nutritional needs. Ask GI doctor about your fluid weight gain. Try some Boost or CIB for calorie and protein intake. Healthy Choice or Smart Ones TV dinner for meals as needed. Eggs, toast and fruit for breakfast. Keep Use Mrs Deliah Boston Keep reading food labels.  Teaching  Method Utilized:  Visual Auditory Hands on  Handouts given during visit include: The Plate Method  Low Sodium Foods Cirrhosis nutrition   Barriers to learning/adherence to lifestyle change: Cirrhosis.  Demonstrated degree of understanding via:  Teach Back   Monitoring/Evaluation:  Dietary intake, exercise, , and body weight in 1 month(s).   >>Recommend to check a Prealbumin/Albumin to evaluate third spacing and severity of malnutrition.>>>

## 2021-02-23 NOTE — Telephone Encounter (Signed)
Noted. His weight was actually 244 in August when I saw him, so he is down. Will send message to her. Appreciate the update. Needs close follow-up with Nephrology.   Can we reach out to patient to see how he is feeling? Any nausea?

## 2021-02-23 NOTE — Telephone Encounter (Signed)
Lmom for pt to return my call.  

## 2021-02-23 NOTE — Telephone Encounter (Signed)
Reached out to patient and he states that he is feeling good now that he was able to get his sleep apnea machine. Pt states that he is not having any nausea and states that he takes the nausea meds when he does.

## 2021-02-25 ENCOUNTER — Inpatient Hospital Stay (HOSPITAL_COMMUNITY): Payer: Medicaid Other

## 2021-02-25 ENCOUNTER — Other Ambulatory Visit: Payer: Self-pay

## 2021-02-25 VITALS — BP 108/71 | HR 77 | Temp 98.1°F | Resp 18

## 2021-02-25 DIAGNOSIS — D509 Iron deficiency anemia, unspecified: Secondary | ICD-10-CM | POA: Diagnosis not present

## 2021-02-25 DIAGNOSIS — D508 Other iron deficiency anemias: Secondary | ICD-10-CM

## 2021-02-25 MED ORDER — SODIUM CHLORIDE 0.9 % IV SOLN
300.0000 mg | Freq: Once | INTRAVENOUS | Status: AC
  Administered 2021-02-25: 300 mg via INTRAVENOUS
  Filled 2021-02-25: qty 300

## 2021-02-25 MED ORDER — SODIUM CHLORIDE 0.9 % IV SOLN: Freq: Once | INTRAVENOUS | Status: AC

## 2021-02-25 NOTE — Patient Instructions (Signed)
[facility]  Discharge Instructions: Thank you for choosing [facility] to provide your oncology and hematology care.  If you have a lab appointment with the [facility], please come in thru the Main Entrance and check in at the main information desk.  Wear comfortable clothing and clothing appropriate for easy access to any Portacath or PICC line.   We strive to give you quality time with your provider. You may need to reschedule your appointment if you arrive late (15 or more minutes).  Arriving late affects you and other patients whose appointments are after yours.  Also, if you miss three or more appointments without notifying the office, you may be dismissed from the clinic at the provider's discretion.      For prescription refill requests, have your pharmacy contact our office and allow 72 hours for refills to be completed.    Today you received Venofer IV iron infusion.     BELOW ARE SYMPTOMS THAT SHOULD BE REPORTED IMMEDIATELY: *FEVER GREATER THAN 100.4 F (38 C) OR HIGHER *CHILLS OR SWEATING *NAUSEA AND VOMITING THAT IS NOT CONTROLLED WITH YOUR NAUSEA MEDICATION *UNUSUAL SHORTNESS OF BREATH *UNUSUAL BRUISING OR BLEEDING *URINARY PROBLEMS (pain or burning when urinating, or frequent urination) *BOWEL PROBLEMS (unusual diarrhea, constipation, pain near the anus) TENDERNESS IN MOUTH AND THROAT WITH OR WITHOUT PRESENCE OF ULCERS (sore throat, sores in mouth, or a toothache) UNUSUAL RASH, SWELLING OR PAIN  UNUSUAL VAGINAL DISCHARGE OR ITCHING   Items with * indicate a potential emergency and should be followed up as soon as possible or go to the Emergency Department if any problems should occur.  Please show the CHEMOTHERAPY ALERT CARD or IMMUNOTHERAPY ALERT CARD at check-in to the Emergency Department and triage nurse.  Should you have questions after your visit or need to cancel or reschedule your appointment, please contact [facility]  [phone]  and follow the prompts.  Office hours are 8:00 a.m. to 4:30 p.m. Monday - Friday. Please note that voicemails left after 4:00 p.m. may not be returned until the following business day.  We are closed weekends and major holidays. You have access to a nurse at all times for urgent questions. Please call the main number to the clinic [phone] and follow the prompts.  For any non-urgent questions, you may also contact your provider using MyChart. We now offer e-Visits for anyone 72 and older to request care online for non-urgent symptoms. For details visit [email or web address].   Also download the MyChart app! Go to the app store, search "MyChart", open the app, select [facility], and log in with your MyChart username and password.  Due to Covid, a mask is required upon entering the hospital/clinic. If you do not have a mask, one will be given to you upon arrival. For doctor visits, patients may have 1 support person aged 82 or older with them. For treatment visits, patients cannot have anyone with them due to current Covid guidelines and our immunocompromised population.

## 2021-02-25 NOTE — Progress Notes (Signed)
Pt presents today for Venofer IV iron infusion per provider's order. Vital signs stable and pt voiced no new complaints at this time.  Peripheral IV started with good blood return pre and post infusion.  Venofer given today per MD orders. Tolerated infusion without adverse affects. Vital signs stable. No complaints at this time. Discharged from clinic ambulatory in stable condition. Alert and oriented x 3. F/U with [facility] as scheduled.

## 2021-03-23 ENCOUNTER — Encounter: Payer: Medicaid Other | Attending: Physician Assistant | Admitting: Nutrition

## 2021-03-23 ENCOUNTER — Other Ambulatory Visit: Payer: Self-pay

## 2021-03-23 ENCOUNTER — Encounter: Payer: Self-pay | Admitting: Nutrition

## 2021-03-23 VITALS — Wt 229.0 lb

## 2021-03-23 DIAGNOSIS — K7460 Unspecified cirrhosis of liver: Secondary | ICD-10-CM

## 2021-03-23 DIAGNOSIS — K766 Portal hypertension: Secondary | ICD-10-CM

## 2021-03-23 DIAGNOSIS — K7581 Nonalcoholic steatohepatitis (NASH): Secondary | ICD-10-CM | POA: Insufficient documentation

## 2021-03-23 DIAGNOSIS — E440 Moderate protein-calorie malnutrition: Secondary | ICD-10-CM

## 2021-03-23 DIAGNOSIS — R188 Other ascites: Secondary | ICD-10-CM | POA: Insufficient documentation

## 2021-03-23 NOTE — Progress Notes (Signed)
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  He notes that his mom died early this month. He notes he is depressed. Only likes to talk to his pastor for his depression. He only eats 1-2 times per day. Has appt to see liver dr in November.   He is using CPAP machine and it's helping him asleep a lot more and much better. Now sleeping 8/9 hrs a night now. Getting much better sleep.   CMP Latest Ref Rng & Units 02/21/2021 02/03/2021 01/27/2021  Glucose 70 - 99 mg/dL 92 77 135(H)  BUN 6 - 20 mg/dL 24(H) 19 20  Creatinine 0.61 - 1.24 mg/dL 1.65(H) 1.17 1.22  Sodium 135 - 145 mmol/L 122(L) 124(L) 127(L)  Potassium 3.5 - 5.1 mmol/L 5.3(H) 4.9 4.8  Chloride 98 - 111 mmol/L 89(L) 94(L) 95(L)  CO2 22 - 32 mmol/L 25 26 24   Calcium 8.9 - 10.3 mg/dL 8.5(L) 8.7(L) 9.0  Total Protein 6.5 - 8.1 g/dL - - -  Total Bilirubin 0.3 - 1.2 mg/dL - - -  Alkaline Phos 38 - 126 U/L - - -  AST 15 - 41 U/L - - -  ALT 0 - 44 U/L - - -   Wt Readings from Last 3 Encounters:  02/21/21 232 lb (105.2 kg)  02/01/21 229 lb 14.4 oz (104.3 kg)  01/26/21 244 lb (110.7 kg)   Ht Readings from Last 3 Encounters:  02/21/21 5' 8"  (1.727 m)  01/26/21 5' 8"  (1.727 m)  12/28/20 5' 8"  (1.727 m)   There is no height or weight on file to calculate BMI. @BMIFA @ Facility age limit for growth percentiles is 20 years. Facility age limit for growth percentiles is 20 years.    Preferred Learning Style: Auditory       Visual -not reading. Hands on  Learning Readiness:  Ready Change in progress   MEDICATIONS:   DIETARY INTAKE:  24-hr recall:  B ( AM):  Eggs and tomatoes,   Snk ( AM):  L ( PM):  skipped. Juice or tea, Strawberies,  D ( PM):  Tomato and cheese sandwich -2,                 And a  Smart One TV dinner. Beverages: water   Usual physical activity: walk some  Estimated energy needs: 1800-2000   calories 225 g carbohydrates 150  g protein 56 g fat  Progress Towards Goal(s):  In progress.   Nutritional Diagnosis:  NB-1.1 Food and nutrition-related knowledge deficit As related to Cirrhosis NASH.  As evidenced by Ascites.    Intervention:  Nutrition Cirrhosis education provided on My Plate, CHO counting, meal planning, portion sizes, timing of meals, Low salt diet, Cirrhosis Nutrition Therapy. Need for daily weights and calling MD with fluid weight gain. Low Potassium foods.   Goals Make appt with eye doctor. Increase fresh fruits and vegetables. Try to eat something in am and lunch time. Limit fluid intake to a L per day per MD. Schedule some time with your pastor to work on the grieving process.  Teaching Method Utilized:  Visual Auditory Hands on  Handouts given during visit include: The Plate Method  Low Sodium Foods Cirrhosis nutrition   Barriers to learning/adherence to lifestyle change: Cirrhosis.  Demonstrated degree of understanding via:  Teach Back   Monitoring/Evaluation:  Dietary intake, exercise, , and body weight in 1 month(s).   >>  Recommend to check a Prealbumin/Albumin to evaluate third spacing and severity of malnutrition.>>>

## 2021-03-23 NOTE — Patient Instructions (Addendum)
Goals  Goals Make appt with eye doctor. Increase fresh fruits and vegetables. Try to eat something in am and lunch time. Schedule some time with your pastor to work on the grieving process.

## 2021-03-29 ENCOUNTER — Other Ambulatory Visit: Payer: Self-pay | Admitting: Internal Medicine

## 2021-03-29 DIAGNOSIS — K7460 Unspecified cirrhosis of liver: Secondary | ICD-10-CM

## 2021-03-29 NOTE — Progress Notes (Signed)
Number not working correctly.

## 2021-03-31 ENCOUNTER — Other Ambulatory Visit (HOSPITAL_COMMUNITY)
Admission: RE | Admit: 2021-03-31 | Discharge: 2021-03-31 | Disposition: A | Discharge status: Home / Self Care | Payer: Medicaid Other | Source: Ambulatory Visit | Attending: Internal Medicine | Admitting: Internal Medicine

## 2021-03-31 DIAGNOSIS — K7460 Unspecified cirrhosis of liver: Secondary | ICD-10-CM | POA: Insufficient documentation

## 2021-03-31 DIAGNOSIS — R188 Other ascites: Secondary | ICD-10-CM | POA: Insufficient documentation

## 2021-03-31 LAB — ALBUMIN: Albumin: 4.3 g/dL (ref 3.5–5.0)

## 2021-03-31 LAB — PREALBUMIN: Prealbumin: 23.5 mg/dL (ref 18–38)

## 2021-03-31 NOTE — Progress Notes (Signed)
Pt was made aware and verbalized understanding. Labs were ordered and the pt stated he will have them done sometime this week.

## 2021-04-05 ENCOUNTER — Ambulatory Visit (HOSPITAL_COMMUNITY)
Admission: RE | Admit: 2021-04-05 | Discharge: 2021-04-05 | Disposition: A | Discharge status: Home / Self Care | Payer: Medicaid Other | Source: Ambulatory Visit | Attending: Physician Assistant | Admitting: Physician Assistant

## 2021-04-05 ENCOUNTER — Other Ambulatory Visit (HOSPITAL_COMMUNITY): Payer: Self-pay | Admitting: Physician Assistant

## 2021-04-05 ENCOUNTER — Other Ambulatory Visit: Payer: Self-pay

## 2021-04-05 DIAGNOSIS — M25511 Pain in right shoulder: Secondary | ICD-10-CM | POA: Insufficient documentation

## 2021-04-13 ENCOUNTER — Other Ambulatory Visit: Payer: Self-pay

## 2021-04-13 ENCOUNTER — Ambulatory Visit: Payer: Medicaid Other | Admitting: Pulmonary Disease

## 2021-04-13 ENCOUNTER — Encounter: Payer: Self-pay | Admitting: Pulmonary Disease

## 2021-04-13 VITALS — BP 128/74 | HR 99 | Temp 98.9°F | Ht 68.0 in | Wt 236.1 lb

## 2021-04-13 DIAGNOSIS — G4733 Obstructive sleep apnea (adult) (pediatric): Secondary | ICD-10-CM | POA: Diagnosis not present

## 2021-04-13 DIAGNOSIS — Z9989 Dependence on other enabling machines and devices: Secondary | ICD-10-CM

## 2021-04-13 NOTE — Progress Notes (Signed)
   Subjective:    Patient ID: Alejandro Porter, male    DOB: 1975/12/05, 45 y.o.   MRN: [id number]  HPI 45 yo with NASH for FU of OSA  PMH -nonalcoholic cirrhosis with portal hypertension, grade 2 varices with portal gastropathy , on Eliquis for portal venous thrombosis, requires paracentesis every month  Chief Complaint  Patient presents with   Follow-up    Feels cpap is helping him and improving sleep started in October.    We reviewed home sleep study which showed moderate OSA with AHI 15/hour, he has started on CPAP for about a month with a full facemask and is settling down.  He states that he is resting better and has decreased episodes of heart racing.  He still takes his gabapentin and his muscle relaxant every night.  He is complaining of right-sided chest pain for the last few months and complains of numbness from the hip down, gabapentin seems to help with this.  I see that he had a shoulder chest x-ray done which was unremarkable.  He reports sadness of mood, his mom was in a nursing home and passed away of sepsis and his brother died of an overdose.  He is grieving but seems to be coping well   Significant tests/ events reviewed 12/2020 HST : AHI 15/ hr worse when supine  Review of Systems neg for any significant sore throat, dysphagia, itching, sneezing, nasal congestion or excess/ purulent secretions, fever, chills, sweats, unintended wt loss, pleuritic or exertional cp, hempoptysis, orthopnea pnd or change in chronic leg swelling. Also denies presyncope, palpitations, heartburn, abdominal pain, nausea, vomiting, diarrhea or change in bowel or urinary habits, dysuria,hematuria, rash, arthralgias, visual complaints, headache, numbness weakness or ataxia.     Objective:   Physical Exam  Gen. 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

## 2021-04-13 NOTE — Assessment & Plan Note (Signed)
He has adjusted well to CPAP.  Download was reviewed which shows excellent compliance on average pressure of 8 to 9 cm with no residual events and mild leak.  He is very compliant and CPAP is only helped improve his daytime somnolence and fatigue. His episodes of heart racing and difficulty breathing during sleep has subsided and he is resting better We will tweak the pressure to auto 5 to 12 cm, so that he is still able to lie supine and require higher pressure  Weight loss encouraged, compliance with goal of at least 4-6 hrs every night is the expectation. Advised against medications with sedative side effects Cautioned against driving when sleepy - understanding that sleepiness will vary on a day to day basis

## 2021-04-13 NOTE — Addendum Note (Signed)
Addended by: Fritzi Mandes D on: 04/13/2021 10:05 AM   Modules accepted: Orders

## 2021-04-13 NOTE — Patient Instructions (Signed)
CPAP is working well Change to auto 5-12 cm

## 2021-05-09 ENCOUNTER — Telehealth: Payer: Self-pay

## 2021-05-09 MED ORDER — LACTULOSE 10 GM/15ML PO SOLN: 20.0000 g | Freq: Three times a day (TID) | ORAL | 2 refills | Status: DC

## 2021-05-09 NOTE — Telephone Encounter (Signed)
Pt called office and requested refill of Lactulose. States liver doctor told him to increase lactulose so his 1 month supply is running out too soon. He is now taking 53m after breakfast, after lunch, and after supper. [facility]

## 2021-05-09 NOTE — Telephone Encounter (Signed)
Completed.

## 2021-05-10 ENCOUNTER — Telehealth: Payer: Self-pay | Admitting: Internal Medicine

## 2021-05-10 NOTE — Telephone Encounter (Signed)
PATIENT CALLED AND SAID THAT HIS PHARMACY DID NOT HAVE HIS PRESCRIPTION, I TOLD HIM TO GIVE THEM A CALL BECAUSE IT SAYS THEY RECEIVED THE REFILL REQUEST

## 2021-05-10 NOTE — Telephone Encounter (Signed)
Spoke with pharmacy and they are working on filling the prescription now. Lmom notifying pt.

## 2021-05-13 ENCOUNTER — Ambulatory Visit: Payer: Medicaid Other | Admitting: Gastroenterology

## 2021-06-09 ENCOUNTER — Inpatient Hospital Stay (HOSPITAL_COMMUNITY): Payer: Medicaid Other | Attending: Hematology

## 2021-06-09 ENCOUNTER — Other Ambulatory Visit: Payer: Self-pay

## 2021-06-09 DIAGNOSIS — D649 Anemia, unspecified: Secondary | ICD-10-CM | POA: Diagnosis not present

## 2021-06-09 DIAGNOSIS — R188 Other ascites: Secondary | ICD-10-CM | POA: Insufficient documentation

## 2021-06-09 DIAGNOSIS — Z7901 Long term (current) use of anticoagulants: Secondary | ICD-10-CM | POA: Diagnosis not present

## 2021-06-09 DIAGNOSIS — K7460 Unspecified cirrhosis of liver: Secondary | ICD-10-CM | POA: Diagnosis not present

## 2021-06-09 DIAGNOSIS — D508 Other iron deficiency anemias: Secondary | ICD-10-CM

## 2021-06-09 DIAGNOSIS — D696 Thrombocytopenia, unspecified: Secondary | ICD-10-CM | POA: Diagnosis present

## 2021-06-09 DIAGNOSIS — E875 Hyperkalemia: Secondary | ICD-10-CM | POA: Insufficient documentation

## 2021-06-09 LAB — CBC WITH DIFFERENTIAL/PLATELET
Abs Immature Granulocytes: 0.07 10*3/uL (ref 0.00–0.07)
Basophils Absolute: 0 10*3/uL (ref 0.0–0.1)
Basophils Relative: 0 %
Eosinophils Absolute: 0.1 10*3/uL (ref 0.0–0.5)
Eosinophils Relative: 1 %
HCT: 39.6 % (ref 39.0–52.0)
Hemoglobin: 13.5 g/dL (ref 13.0–17.0)
Immature Granulocytes: 1 %
Lymphocytes Relative: 3 %
Lymphs Abs: 0.3 10*3/uL — ABNORMAL LOW (ref 0.7–4.0)
MCH: 31.3 pg (ref 26.0–34.0)
MCHC: 34.1 g/dL (ref 30.0–36.0)
MCV: 91.7 fL (ref 80.0–100.0)
Monocytes Absolute: 0.8 10*3/uL (ref 0.1–1.0)
Monocytes Relative: 9 %
Neutro Abs: 7.6 10*3/uL (ref 1.7–7.7)
Neutrophils Relative %: 86 %
Platelets: 116 10*3/uL — ABNORMAL LOW (ref 150–400)
RBC: 4.32 MIL/uL (ref 4.22–5.81)
RDW: 14 % (ref 11.5–15.5)
WBC: 8.8 10*3/uL (ref 4.0–10.5)
nRBC: 0 % (ref 0.0–0.2)

## 2021-06-09 LAB — IRON AND TIBC
Iron: 184 ug/dL — ABNORMAL HIGH (ref 45–182)
Saturation Ratios: 44 % — ABNORMAL HIGH (ref 17.9–39.5)
TIBC: 423 ug/dL (ref 250–450)
UIBC: 239 ug/dL

## 2021-06-09 LAB — FERRITIN: Ferritin: 45 ng/mL (ref 24–336)

## 2021-06-14 ENCOUNTER — Other Ambulatory Visit: Payer: Self-pay

## 2021-06-14 ENCOUNTER — Encounter: Payer: Self-pay | Admitting: *Deleted

## 2021-06-14 ENCOUNTER — Ambulatory Visit: Payer: Medicaid Other | Admitting: Gastroenterology

## 2021-06-14 ENCOUNTER — Encounter: Payer: Self-pay | Admitting: Gastroenterology

## 2021-06-14 VITALS — BP 116/77 | HR 73 | Temp 96.9°F | Ht 68.0 in | Wt 245.4 lb

## 2021-06-14 DIAGNOSIS — K7460 Unspecified cirrhosis of liver: Secondary | ICD-10-CM

## 2021-06-14 NOTE — Progress Notes (Signed)
Referring Provider: Sofie Rower, PA-C Primary Care Physician:  Sofie Rower, PA-C Primary GI: Dr. Gala Romney  Chief Complaint  Patient presents with   Cirrhosis   Abdominal Pain    Occ right side    HPI:   Alejandro Porter is a 46 y.o. male presenting today with a history of cirrhosis complicated by portal hypertension, esophageal varices, portal vein thrombosis on Eliquis, hepatic encephalopathy on lactulose and Xfiaxan, ascites requiring repeat paras, history of SBP in April 2022, IDA followed by Hematology, and undergoing liver transplant eval at [facility]. He is here today in routine follow-up.  Esophageal varices: not a candidate for non-selective blocker after history of SBP. Next EGD due in Jan 2024.  History of SBP: Cipro 500 mg daily for prophylactic purposes.   Cirrhosis: last seen by Roosevelt Locks, NP, in Nov 2022. He is being followed for candidacy with recommendations to optimize overall physical condition before pursuing transplant.   Wearning a Cpap machine now. Mom passed in October and brother in November. Dad is support person. Lives with dad. Oldest brother Shanon Brow is also a support person.   Denies abdominal distension. One day had RUQ pain and RLQ pain. Starting to hurt now as a 2 out of 10. Worsens as the day progresses. Feels tired by 8pm. Denies any shortness of breath. Max of 3 or 4 out of 10 for pain. Nausea is better from last visit. When eating, stomach will make a weird noise. Taking lactulose. Was told to take 60 ml TID. If takes it 3 times a day, has 5-8 stools. Won't go if taking only BID.   Lasix 20 mg BID and aldactone 100 mg BID. Cipro daily. Blood work upcoming on 1/13 with Nephrology.   Past Medical History:  Diagnosis Date   CKD (chronic kidney disease) stage 2, GFR 60-89 ml/min    H/O colonoscopy    H/O endoscopy    Hypertension    Liver cirrhosis (HCC)    Neuropathy    OSA (obstructive sleep apnea)    S/P abdominal paracentesis      Past Surgical History:  Procedure Laterality Date   BIOPSY  09/12/2019   Procedure: BIOPSY;  Surgeon: Daneil Dolin, MD;  [facility];  Service: Endoscopy;;   COLONOSCOPY N/A 09/18/2019   Dr. Oneida Alar: 12 colon polyps removed, multiple simple adenomas and some inflammatory polyps, diverticulosis, external and internal hemorrhoids.  Next colonoscopy in 3 years.   ESOPHAGEAL BANDING N/A 09/12/2019   Procedure: ESOPHAGEAL BANDING;  Surgeon: Daneil Dolin, MD;  [facility];  Service: Endoscopy;  Laterality: N/A;   ESOPHAGOGASTRODUODENOSCOPY N/A 09/12/2019   Dr. Gala Romney: Mild erosive reflux esophagitis.  Schatzki ring status post dilation.  Small grade 1/grade 2 esophageal varices, portal hypertensive gastropathy, hyperplastic gastric polyp, gastric erosions with chronic inactive gastritis on biopsies, no H. pylori.   ESOPHAGOGASTRODUODENOSCOPY (EGD) WITH PROPOFOL N/A 12/16/2020   two short columns of no more than Grade 2 esophageal varices, appearing innocent. Grade 1 varices not apparent today. Multiple large posterior body and antral hyperplastic, hemorrhagic appearing polyps, larges approximately 2.5 cm pedunculated. Portal gastropathy. Normal duodenum. Polypectomy with clips placed. EGD in 18 months.   POLYPECTOMY  09/18/2019   Procedure: POLYPECTOMY;  Surgeon: Danie Binder, MD;  [facility];  Service: Endoscopy;;   POLYPECTOMY  12/16/2020   Procedure: POLYPECTOMY;  Surgeon: Daneil Dolin, MD;  [facility];  Service: Endoscopy;;  gastric    Current Outpatient  Medications  Medication Sig Dispense Refill   apixaban (ELIQUIS) 5 MG TABS tablet Take 1 tablet (5 mg total) by mouth 2 (two) times daily. 60 tablet 5   Cholecalciferol (VITAMIN D3) 125 MCG (5000 UT) TABS Take 5,000 Units by mouth daily.     ciprofloxacin (CIPRO) 500 MG tablet Take 1 tablet (500 mg total) by mouth daily. 30 tablet 5   cyclobenzaprine (FLEXERIL) 5 MG tablet Take 5  mg by mouth 2 (two) times daily as needed for muscle spasms.     Ferrous Sulfate (IRON) 325 (65 Fe) MG TABS Take 1 tablet (325 mg total) by mouth daily. 30 tablet 0   furosemide (LASIX) 20 MG tablet Take 1 tablet (20 mg total) by mouth 2 (two) times daily. 60 tablet 2   gabapentin (NEURONTIN) 300 MG capsule Take 300 mg by mouth as needed (for pain).     lactulose (CONSTULOSE) 10 GM/15ML solution Take 30 mLs (20 g total) by mouth 3 (three) times daily. (Patient taking differently: Take 40 g by mouth 3 (three) times daily.) 946 mL 2   levOCARNitine (CARNITOR) 330 MG tablet Take 330 mg by mouth 3 (three) times daily.     Multiple Vitamin (MULTIVITAMIN) tablet Take 1 tablet by mouth daily.     omeprazole (PRILOSEC) 20 MG capsule Take 1 capsule (20 mg total) by mouth daily. 30 capsule 5   ondansetron (ZOFRAN) 4 MG tablet Take 1 tablet (4 mg total) by mouth every 6 (six) hours as needed for nausea or vomiting. 30 tablet 1   rifaximin (XIFAXAN) 550 MG TABS tablet Take 1 tablet (550 mg total) by mouth 2 (two) times daily. 60 tablet 3   spironolactone (ALDACTONE) 100 MG tablet Take 100 mg by mouth 2 (two) times daily.     No current facility-administered medications for this visit.    Allergies as of 06/14/2021 - Review Complete 06/14/2021  Allergen Reaction Noted   Chlorthalidone  11/04/2019   Metoprolol  11/04/2019    Family History  Problem Relation Age of Onset   Brain cancer Mother    Diabetes Mother    Diabetes Father    Pulmonary embolism Brother    Liver disease Neg Hx     Social History   Socioeconomic History   Marital status: Single    Spouse name: Not on file   Number of children: Not on file   Years of education: Not on file   Highest education level: Not on file  Occupational History   Occupation: umemployed  Tobacco Use   Smoking status: Never   Smokeless tobacco: Never  Vaping Use   Vaping Use: Never used  Substance and Sexual Activity   Alcohol use: Never   Drug  use: Never   Sexual activity: Not Currently  Other Topics Concern   Not on file  Social History Narrative   Not on file   Social Determinants of Health   Financial Resource Strain: Not on file  Food Insecurity: Not on file  Transportation Needs: Not on file  Physical Activity: Not on file  Stress: Not on file  Social Connections: Not on file    Review of Systems: Gen: Denies fever, chills, anorexia. Denies fatigue, weakness, weight loss.  CV: Denies chest pain, palpitations, syncope, peripheral edema, and claudication. Resp: Denies dyspnea at rest, cough, wheezing, coughing up blood, and pleurisy. GI: see HPI Derm: Denies rash, itching, dry skin Psych: Denies depression, anxiety, memory loss, confusion. No homicidal or suicidal ideation.  Heme:  Denies bruising, bleeding, and enlarged lymph nodes.  Physical Exam: BP 116/77    Pulse 73    Temp (!) 96.9 F (36.1 C) (Temporal)    Ht 5' 8"  (1.727 m)    Wt 245 lb 6.4 oz (111.3 kg)    BMI 37.31 kg/m  General:   Alert and oriented. No distress noted. Pleasant and cooperative.  Head:  Normocephalic and atraumatic. Eyes:  Conjuctiva clear without scleral icterus. Mouth:  mask in place Abdomen:  +BS, soft, non-tender and non-distended. No rebound or guarding. No HSM or masses noted. Msk:  Symmetrical without gross deformities. Normal posture. Extremities:  Without edema. Neurologic:  Alert and  oriented x4 Psych:  Alert and cooperative. Normal mood and affect.  ASSESSMENT/PLAN: Stephan Nelis is a 46 y.o. male presenting today with a history of cirrhosis complicated by portal hypertension, esophageal varices, portal vein thrombosis on Eliquis, hepatic encephalopathy on lactulose and Xfiaxan, ascites requiring repeat paras, history of SBP in April 2022, IDA followed by Hematology, and undergoing liver transplant eval at [facility]. He is here today in routine follow-up.   Cirrhosis: due for RUQ [place] Feb 2023. Next EGD due in Jan  2024. Not a candidate for non-selective beta blocker after history of SBP and also with CKD. Continue to follow with Hepatology for liver transplant candidacy. Will keep diuretic regimen the same. Continue to follow closely with Nephrology.   History of SBP: Continue Cipro 500 mg daily for prophylactic purposes.   Vague right-sided abdominal pain progressing as day progresses. Unclear etiology. RUQ [place] as ordered.   Diarrhea: in setting of increased lactulose dosing. I have asked him to take 60 ml BID and 30 ml once daily to try and achieve a happy medium (has diarrhea with 60 ml TID).   Continue close follow-up in 3 months.  Annitta Needs, PhD, ANP-BC [facility] Gastroenterology

## 2021-06-14 NOTE — Patient Instructions (Addendum)
Let's take Lactulose 60 milliliters twice a day, and add 30 milliliters for the third dose (so, 60 ml after lunch, 60 ml after lunch, and 30 ml after supper). We are trying to make sure you have at least 3 soft bowel movements a day.   We will order an ultrasound of your liver for February.  No changes to your medications at this time.   We will see you in 3 months!  I am so sorry for your losses!  I enjoyed seeing you again today! As you know, I value our relationship and want to provide genuine, compassionate, and quality care. I welcome your feedback. If you receive a survey regarding your visit,  I greatly appreciate you taking time to fill this out. See you next time!  Annitta Needs, PhD, ANP-BC [facility] Gastroenterology

## 2021-06-16 ENCOUNTER — Inpatient Hospital Stay (HOSPITAL_COMMUNITY): Payer: Medicaid Other | Admitting: Hematology

## 2021-06-16 ENCOUNTER — Other Ambulatory Visit: Payer: Self-pay

## 2021-06-16 VITALS — BP 113/76 | HR 86 | Temp 99.0°F | Resp 18 | Ht 68.0 in | Wt 246.0 lb

## 2021-06-16 DIAGNOSIS — E875 Hyperkalemia: Secondary | ICD-10-CM | POA: Diagnosis not present

## 2021-06-16 DIAGNOSIS — D508 Other iron deficiency anemias: Secondary | ICD-10-CM

## 2021-06-16 DIAGNOSIS — D696 Thrombocytopenia, unspecified: Secondary | ICD-10-CM | POA: Diagnosis not present

## 2021-06-16 NOTE — Progress Notes (Signed)
[address] [place], [place] [address]   CLINIC:  Medical Oncology/Hematology  PCP:  Sofie Rower, PA-C [address] / [place] [address]  [phone]  REASON FOR VISIT:  Follow-up for thrombocytopenia and hyperkalemia  PRIOR THERAPY: none  CURRENT THERAPY: surveillance  INTERVAL HISTORY:  [name], a 46 y.o. male, returns for routine follow-up for his thrombocytopenia and hyperkalemia. Azion was last seen on 02/01/2021.  Today he reports feeling good. He reports fatigue and light-headedness. He is not currently taking iron tablets; when he was taking iron tablets previously he reports tolerating them well.   REVIEW OF SYSTEMS:  Review of Systems  Constitutional:  Positive for fatigue. Negative for appetite change.  Respiratory:  Positive for shortness of breath.   Cardiovascular:  Positive for chest pain.  Gastrointestinal:  Positive for constipation, diarrhea and nausea.  Musculoskeletal:  Positive for flank pain (6/10 R side).  Neurological:  Positive for dizziness, headaches, light-headedness and numbness.  All other systems reviewed and are negative.  PAST MEDICAL/SURGICAL HISTORY:  Past Medical History:  Diagnosis Date   CKD (chronic kidney disease) stage 2, GFR 60-89 ml/min    H/O colonoscopy    H/O endoscopy    Hypertension    Liver cirrhosis (HCC)    Neuropathy    OSA (obstructive sleep apnea)    S/P abdominal paracentesis    Past Surgical History:  Procedure Laterality Date   BIOPSY  09/12/2019   Procedure: BIOPSY;  Surgeon: Daneil Dolin, MD;  [facility];  Service: Endoscopy;;   COLONOSCOPY N/A 09/18/2019   Dr. Oneida Alar: 12 colon polyps removed, multiple simple adenomas and some inflammatory polyps, diverticulosis, external and internal hemorrhoids.  Next colonoscopy in 3 years.   ESOPHAGEAL BANDING N/A 09/12/2019   Procedure: ESOPHAGEAL BANDING;  Surgeon: Daneil Dolin, MD;  [facility];  Service: Endoscopy;  Laterality: N/A;   ESOPHAGOGASTRODUODENOSCOPY N/A 09/12/2019   Dr. Gala Romney: Mild erosive reflux esophagitis.  Schatzki ring status post dilation.  Small grade 1/grade 2 esophageal varices, portal hypertensive gastropathy, hyperplastic gastric polyp, gastric erosions with chronic inactive gastritis on biopsies, no H. pylori.   ESOPHAGOGASTRODUODENOSCOPY (EGD) WITH PROPOFOL N/A 12/16/2020   two short columns of no more than Grade 2 esophageal varices, appearing innocent. Grade 1 varices not apparent today. Multiple large posterior body and antral hyperplastic, hemorrhagic appearing polyps, larges approximately 2.5 cm pedunculated. Portal gastropathy. Normal duodenum. Polypectomy with clips placed. EGD in 18 months.   POLYPECTOMY  09/18/2019   Procedure: POLYPECTOMY;  Surgeon: Danie Binder, MD;  [facility];  Service: Endoscopy;;   POLYPECTOMY  12/16/2020   Procedure: POLYPECTOMY;  Surgeon: Daneil Dolin, MD;  [facility];  Service: Endoscopy;;  gastric    SOCIAL HISTORY:  Social History   Socioeconomic History   Marital status: Single    Spouse name: Not on file   Number of children: Not on file   Years of education: Not on file   Highest education level: Not on file  Occupational History   Occupation: umemployed  Tobacco Use   Smoking status: Never   Smokeless tobacco: Never  Vaping Use   Vaping Use: Never used  Substance and Sexual Activity   Alcohol use: Never   Drug use: Never   Sexual activity: Not Currently  Other Topics Concern   Not on file  Social History Narrative   Not on file  Social Determinants of Health   Financial Resource Strain: Not on file  Food Insecurity: Not on file  Transportation Needs: Not on file  Physical Activity: Not on file  Stress: Not on file  Social Connections: Not on file  Intimate Partner Violence: Not on file    FAMILY HISTORY:  Family History  Problem Relation Age of  Onset   Brain cancer Mother    Diabetes Mother    Diabetes Father    Pulmonary embolism Brother    Liver disease Neg Hx     CURRENT MEDICATIONS:  Current Outpatient Medications  Medication Sig Dispense Refill   apixaban (ELIQUIS) 5 MG TABS tablet Take 1 tablet (5 mg total) by mouth 2 (two) times daily. 60 tablet 5   Cholecalciferol (VITAMIN D3) 125 MCG (5000 UT) TABS Take 5,000 Units by mouth daily.     ciprofloxacin (CIPRO) 500 MG tablet Take 1 tablet (500 mg total) by mouth daily. 30 tablet 5   cyclobenzaprine (FLEXERIL) 5 MG tablet Take 5 mg by mouth 2 (two) times daily as needed for muscle spasms. (Patient not taking: Reported on 06/16/2021)     Ferrous Sulfate (IRON) 325 (65 Fe) MG TABS Take 1 tablet (325 mg total) by mouth daily. 30 tablet 0   furosemide (LASIX) 20 MG tablet Take 1 tablet (20 mg total) by mouth 2 (two) times daily. 60 tablet 2   gabapentin (NEURONTIN) 300 MG capsule Take 300 mg by mouth as needed (for pain).     lactulose (CONSTULOSE) 10 GM/15ML solution Take 30 mLs (20 g total) by mouth 3 (three) times daily. (Patient taking differently: Take 40 g by mouth 3 (three) times daily.) 946 mL 2   levOCARNitine (CARNITOR) 330 MG tablet Take 330 mg by mouth 3 (three) times daily.     Multiple Vitamin (MULTIVITAMIN) tablet Take 1 tablet by mouth daily.     omeprazole (PRILOSEC) 20 MG capsule Take 1 capsule (20 mg total) by mouth daily. 30 capsule 5   ondansetron (ZOFRAN) 4 MG tablet Take 1 tablet (4 mg total) by mouth every 6 (six) hours as needed for nausea or vomiting. (Patient not taking: Reported on 06/16/2021) 30 tablet 1   rifaximin (XIFAXAN) 550 MG TABS tablet Take 1 tablet (550 mg total) by mouth 2 (two) times daily. 60 tablet 3   spironolactone (ALDACTONE) 100 MG tablet Take 100 mg by mouth 2 (two) times daily.     No current facility-administered medications for this visit.    ALLERGIES:  Allergies  Allergen Reactions   Chlorthalidone     Severe headaches     Metoprolol     Severe headaches     PHYSICAL EXAM:  Performance status (ECOG): 1 - Symptomatic but completely ambulatory  Vitals:   06/16/21 1539  BP: 113/76  Pulse: 86  Resp: 18  Temp: 99 F (37.2 C)  SpO2: 100%   Wt Readings from Last 3 Encounters:  06/16/21 246 lb 0.5 oz (111.6 kg)  06/14/21 245 lb 6.4 oz (111.3 kg)  04/13/21 236 lb 1.3 oz (107.1 kg)   Physical Exam Vitals reviewed.  Constitutional:      Appearance: Normal appearance. He is obese.  Cardiovascular:     Rate and Rhythm: Normal rate and regular rhythm.     Pulses: Normal pulses.     Heart sounds: Normal heart sounds.  Pulmonary:     Effort: Pulmonary effort is normal.     Breath sounds: Normal breath sounds.  Neurological:  General: No focal deficit present.     Mental Status: He is alert and oriented to person, place, and time.  Psychiatric:        Mood and Affect: Mood normal.        Behavior: Behavior normal.    LABORATORY DATA:  I have reviewed the labs as listed.  CBC Latest Ref Rng & Units 06/09/2021 02/03/2021 01/25/2021  WBC 4.0 - 10.5 K/uL 8.8 6.6 6.1  Hemoglobin 13.0 - 17.0 g/dL 13.5 11.4(L) 10.5(L)  Hematocrit 39.0 - 52.0 % 39.6 34.7(L) 31.2(L)  Platelets 150 - 400 K/uL 116(L) 137(L) 121(L)   CMP Latest Ref Rng & Units 02/21/2021 02/03/2021 01/27/2021  Glucose 70 - 99 mg/dL 92 77 135(H)  BUN 6 - 20 mg/dL 24(H) 19 20  Creatinine 0.61 - 1.24 mg/dL 1.65(H) 1.17 1.22  Sodium 135 - 145 mmol/L 122(L) 124(L) 127(L)  Potassium 3.5 - 5.1 mmol/L 5.3(H) 4.9 4.8  Chloride 98 - 111 mmol/L 89(L) 94(L) 95(L)  CO2 22 - 32 mmol/L 25 26 24   Calcium 8.9 - 10.3 mg/dL 8.5(L) 8.7(L) 9.0  Total Protein 6.5 - 8.1 g/dL - - -  Total Bilirubin 0.3 - 1.2 mg/dL - - -  Alkaline Phos 38 - 126 U/L - - -  AST 15 - 41 U/L - - -  ALT 0 - 44 U/L - - -      Component Value Date/Time   RBC 4.32 06/09/2021 1407   MCV 91.7 06/09/2021 1407   MCH 31.3 06/09/2021 1407   MCHC 34.1 06/09/2021 1407   RDW 14.0 06/09/2021  1407   LYMPHSABS 0.3 (L) 06/09/2021 1407   MONOABS 0.8 06/09/2021 1407   EOSABS 0.1 06/09/2021 1407   BASOSABS 0.0 06/09/2021 1407    DIAGNOSTIC IMAGING:  I have independently reviewed the scans and discussed with the patient. No results found.   ASSESSMENT:  1.  Mild to moderate thrombocytopenia: -Mild to moderate thrombocytopenia ranging from 90-1 27 since April 2021. -SPEP was negative.  B12, methylmalonic acid, folic acid were normal.  Hepatitis B  was negative.  H. pylori was negative. -Thrombocytopenia has been intermittent and resolved on recent labs.   2.  Portal vein thrombosis: -Ultrasound Doppler of the liver on 09/11/2019 showed nonocclusive hyperechoic thrombus along the main portal vein wall posteriorly extending into the right portal vein. -He is on Eliquis and is tolerating well.   3.  New onset cirrhosis and ascites: -[facility] from [facility] on 08/17/2019 showed liver morphology consistent with cirrhosis with no masses.  Enlarged spleen measuring 22 cm.  Nonocclusive thrombus in the portal vein.  Prominent to mildly enlarged periceliac and gastrohepatic ligament lymph nodes.  3.5 cm left adrenal mass consistent with adenoma.   PLAN:  1.  Mild to moderate thrombocytopenia: - He has mild thrombocytopenia from splenomegaly.  Platelet count 116.   2.  Portal vein thrombosis: - He is continuing Eliquis with no bleeding issues reported.   3.  New onset cirrhosis and ascites: - He is taking Lasix 20 mg twice daily along with spironolactone 100 mg twice daily. - He reports occasional dizziness particularly in the afternoons when he is standing up and walking. - I have recommended him to cut back on spironolactone to 50 mg twice daily.  If there is no improvement, he will call his PMD.   4.  Normocytic anemia: - He previously took iron pills which caused constipation. - He denies any bleeding per rectum or melena. - Received 3  infusions of Venofer from  02/09/2021 through 02/25/2021. - Reviewed labs from 06/09/2021 which showed ferritin 45 and percent saturation 44.  Hemoglobin improved to 13.5.  No indication for parenteral iron therapy. - RTC 3 months with repeat CBC, ferritin and iron panel.  Orders placed this encounter:  No orders of the defined types were placed in this encounter.    Derek Jack, MD [place] [phone]   I, Thana Ates, am acting as a scribe for Dr. Derek Jack.  I, Derek Jack MD, have reviewed the above documentation for accuracy and completeness, and I agree with the above.

## 2021-06-16 NOTE — Patient Instructions (Signed)
[place] at [facility] Discharge Instructions  You were seen and examined today by Dr. Delton Coombes. He reviewed your most recent labs and everything looks good. He recommends you splitting the spironolactone pill and taking 1/2 in the morning and 1/2 at night. Please keep follow up appointment as scheduled in 3 months.   Thank you for choosing [place] [facility] to provide your oncology and hematology care.  To afford each patient quality time with our provider, please arrive at least 15 minutes before your scheduled appointment time.   If you have a lab appointment with the [facility] please come in thru the Main Entrance and check in at the main information desk.  You need to re-schedule your appointment should you arrive 10 or more minutes late.  We strive to give you quality time with our providers, and arriving late affects you and other patients whose appointments are after yours.  Also, if you no show three or more times for appointments you may be dismissed from the clinic at the providers discretion.     Again, thank you for choosing [facility].  Our hope is that these requests will decrease the amount of time that you wait before being seen by our physicians.       _____________________________________________________________  Should you have questions after your visit to [facility], please contact our office at [phone] and follow the prompts.  Our office hours are 8:00 a.m. and 4:30 p.m. Monday - Friday.  Please note that voicemails left after 4:00 p.m. may not be returned until the following business day.  We are closed weekends and major holidays.  You do have access to a nurse 24-7, just call the main number to the clinic [phone] and do not press any options, hold on the line and a nurse will answer the phone.    For prescription refill requests, have your pharmacy contact our office and  allow 72 hours.    Due to Covid, you will need to wear a mask upon entering the hospital. If you do not have a mask, a mask will be given to you at the Main Entrance upon arrival. For doctor visits, patients may have 1 support person age 79 or older with them. For treatment visits, patients can not have anyone with them due to social distancing guidelines and our immunocompromised population.

## 2021-06-21 ENCOUNTER — Other Ambulatory Visit: Payer: Self-pay

## 2021-06-21 ENCOUNTER — Ambulatory Visit (HOSPITAL_COMMUNITY)
Admission: RE | Admit: 2021-06-21 | Discharge: 2021-06-21 | Disposition: A | Discharge status: Home / Self Care | Payer: Medicaid Other | Source: Ambulatory Visit | Attending: Gastroenterology | Admitting: Gastroenterology

## 2021-06-21 DIAGNOSIS — K7460 Unspecified cirrhosis of liver: Secondary | ICD-10-CM | POA: Insufficient documentation

## 2021-06-27 ENCOUNTER — Telehealth: Payer: Self-pay | Admitting: *Deleted

## 2021-06-27 NOTE — Telephone Encounter (Signed)
Patient called in and is requesting results of his [place] he had done last week.

## 2021-06-30 ENCOUNTER — Other Ambulatory Visit: Payer: Self-pay | Admitting: Gastroenterology

## 2021-07-11 ENCOUNTER — Telehealth: Payer: Medicaid Other | Admitting: Internal Medicine

## 2021-07-11 DIAGNOSIS — R110 Nausea: Secondary | ICD-10-CM

## 2021-07-11 DIAGNOSIS — R6881 Early satiety: Secondary | ICD-10-CM

## 2021-07-11 NOTE — Telephone Encounter (Signed)
Pt was seen in office last month and as follow up scheduled. Also, on the wait list. He said he feels sick whenever he eats and doesn't feel well. Please advise. [phone]

## 2021-07-11 NOTE — Telephone Encounter (Signed)
Returned the pt's call and was advised by him that everytime he eats he gets nauseated (this has been happening for the past one and a half months) states it is off and on though. (Pt does have have Zofran on his meds list but reported not taking it on 06/16/2021 ). States he has also got diarrhea he has had for 3 weeks with some abd pain on top of abdomen. Pt has contulose on his med list also and I asked him was he taking and constipation meds he said no. He is only taking what is prescribed to him. Pt states the pain is like the cramps you get when you have to have a BM and once he does it goes away sometime.  Please advise

## 2021-07-11 NOTE — Telephone Encounter (Signed)
Returned the pt's call X 2 and his line was busy will try back later.

## 2021-07-11 NOTE — Telephone Encounter (Signed)
How much lactulose is he taking?   [facility] clinical pool: Let's arrange a GES in near future due to nausea, early satiety.   Stacey: please put on cancellation list in meantime.

## 2021-07-11 NOTE — Telephone Encounter (Signed)
Returned the pt's call and he is taking 3 doses of Lactulose. He states his liver Dr instructed him to take a dose after each meal and he is agreeable to being seen earlier and having GES scheduled

## 2021-07-12 NOTE — Addendum Note (Signed)
Addended by: Cheron Every on: 07/12/2021 07:45 AM   Modules accepted: Orders

## 2021-07-12 NOTE — Telephone Encounter (Signed)
Called pt. He is aware of his GES appt details. He voiced understanding

## 2021-07-19 ENCOUNTER — Encounter (HOSPITAL_COMMUNITY): Payer: Self-pay

## 2021-07-19 ENCOUNTER — Encounter (HOSPITAL_COMMUNITY)
Admission: RE | Admit: 2021-07-19 | Discharge: 2021-07-19 | Disposition: A | Discharge status: Home / Self Care | Payer: Medicaid Other | Source: Ambulatory Visit | Attending: Gastroenterology | Admitting: Gastroenterology

## 2021-07-19 ENCOUNTER — Other Ambulatory Visit: Payer: Self-pay | Admitting: Gastroenterology

## 2021-07-19 ENCOUNTER — Other Ambulatory Visit: Payer: Self-pay

## 2021-07-19 DIAGNOSIS — R6881 Early satiety: Secondary | ICD-10-CM | POA: Diagnosis present

## 2021-07-19 DIAGNOSIS — R110 Nausea: Secondary | ICD-10-CM | POA: Insufficient documentation

## 2021-07-19 MED ORDER — TECHNETIUM TC 99M SULFUR COLLOID
2.0000 | Freq: Once | INTRAVENOUS | Status: AC | PRN
  Administered 2021-07-19: 2.2 via ORAL

## 2021-07-20 ENCOUNTER — Encounter: Payer: Medicaid Other | Attending: Physician Assistant | Admitting: Nutrition

## 2021-07-20 VITALS — Ht 68.0 in | Wt 248.6 lb

## 2021-07-20 DIAGNOSIS — K7581 Nonalcoholic steatohepatitis (NASH): Secondary | ICD-10-CM | POA: Insufficient documentation

## 2021-07-20 DIAGNOSIS — E66812 Obesity, class 2: Secondary | ICD-10-CM

## 2021-07-20 DIAGNOSIS — K7460 Unspecified cirrhosis of liver: Secondary | ICD-10-CM | POA: Insufficient documentation

## 2021-07-20 DIAGNOSIS — E440 Moderate protein-calorie malnutrition: Secondary | ICD-10-CM | POA: Diagnosis present

## 2021-07-20 NOTE — Progress Notes (Signed)
°  Medical Nutrition Therapy:  Appt start time: 1500  end time: 9326  Assessment:  Primary concerns today: Cirrhosis of liver, NASH.Marland Kitchen LIves with his dad. Has been trying to  walk to help get his weight back down but not able to walk a lot due to fatigue. 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 Got bit by 2 dogs and getting treated for that.  Had GES study done yesterday.  Waiting on results. CMP Latest Ref Rng & Units 02/21/2021 02/03/2021 01/27/2021  Glucose 70 - 99 mg/dL 92 77 135(H)  BUN 6 - 20 mg/dL 24(H) 19 20  Creatinine 0.61 - 1.24 mg/dL 1.65(H) 1.17 1.22  Sodium 135 - 145 mmol/L 122(L) 124(L) 127(L)  Potassium 3.5 - 5.1 mmol/L 5.3(H) 4.9 4.8  Chloride 98 - 111 mmol/L 89(L) 94(L) 95(L)  CO2 22 - 32 mmol/L 25 26 24   Calcium 8.9 - 10.3 mg/dL 8.5(L) 8.7(L) 9.0  Total Protein 6.5 - 8.1 g/dL - - -  Total Bilirubin 0.3 - 1.2 mg/dL - - -  Alkaline Phos 38 - 126 U/L - - -  AST 15 - 41 U/L - - -  ALT 0 - 44 U/L - - -   Wt Readings from Last 3 Encounters:  06/16/21 246 lb 0.5 oz (111.6 kg)  06/14/21 245 lb 6.4 oz (111.3 kg)  04/13/21 236 lb 1.3 oz (107.1 kg)   Ht Readings from Last 3 Encounters:  06/16/21 5' 8"  (1.727 m)  06/14/21 5' 8"  (1.727 m)  04/13/21 5' 8"  (1.727 m)   There is no height or weight on file to calculate BMI. @BMIFA @ Facility age limit for growth percentiles is 20 years. Facility age limit for growth percentiles is 20 years.    Preferred Learning Style: Auditory       Visual -not reading. Hands on  Learning Readiness:  Ready Change in progress   MEDICATIONS:   DIETARY INTAKE:  24-hr recall: B ( AM):  [place] toast strips with syrup L) mandarin oranges and grapes, water Protein shake once a day. D) vegetables soup, water   Beverages: water   Usual physical activity: walk some  Estimated energy needs: 1800-2000  calories 225 g carbohydrates 150  g protein 56 g fat  Progress Towards Goal(s):  In progress.   Nutritional Diagnosis:  NB-1.1 Food and  nutrition-related knowledge deficit As related to Cirrhosis NASH.  As evidenced by Ascites.    Intervention:  Nutrition Cirrhosis education provided on My Plate, CHO counting, meal planning, portion sizes, timing of meals, Low salt diet, Cirrhosis Nutrition Therapy. Need for daily weights and calling MD with fluid weight gain. Low Potassium foods.    Goals  Continue to increase fresh fruits and vegetables and protein rich foods Limit  fluid intake to 1 l per day. Avoid fried fish portions-baked or grilled fresh fish is fine. Avoid high potassium foods.  Teaching Method Utilized:  Visual Auditory Hands on  Handouts given during visit include: The Plate Method  Low Sodium Foods Cirrhosis nutrition   Barriers to learning/adherence to lifestyle change: Cirrhosis.  Demonstrated degree of understanding via:  Teach Back   Monitoring/Evaluation:  Dietary intake, exercise, , and body weight in 1 month(s).

## 2021-07-20 NOTE — Patient Instructions (Signed)
Goals  Continue to increase fresh fruits and vegetables and protein rich foods Limit  fluid intake to 1 l per day. Avoid fried fish portions-baked or grilled fresh fish is fine. Avoid high potassium foods.

## 2021-07-28 ENCOUNTER — Encounter: Payer: Self-pay | Admitting: *Deleted

## 2021-07-28 ENCOUNTER — Ambulatory Visit: Payer: Medicaid Other | Admitting: Gastroenterology

## 2021-07-28 ENCOUNTER — Other Ambulatory Visit (HOSPITAL_COMMUNITY)
Admission: RE | Admit: 2021-07-28 | Discharge: 2021-07-28 | Disposition: A | Discharge status: Home / Self Care | Payer: Medicaid Other | Source: Ambulatory Visit | Attending: Gastroenterology | Admitting: Gastroenterology

## 2021-07-28 ENCOUNTER — Encounter: Payer: Self-pay | Admitting: Gastroenterology

## 2021-07-28 ENCOUNTER — Other Ambulatory Visit: Payer: Self-pay

## 2021-07-28 VITALS — BP 144/81 | HR 82 | Temp 97.5°F | Ht 68.0 in | Wt 249.6 lb

## 2021-07-28 DIAGNOSIS — R5383 Other fatigue: Secondary | ICD-10-CM | POA: Diagnosis not present

## 2021-07-28 DIAGNOSIS — R1011 Right upper quadrant pain: Secondary | ICD-10-CM | POA: Diagnosis present

## 2021-07-28 LAB — CBC WITH DIFFERENTIAL/PLATELET
Abs Immature Granulocytes: 0.04 10*3/uL (ref 0.00–0.07)
Basophils Absolute: 0 10*3/uL (ref 0.0–0.1)
Basophils Relative: 0 %
Eosinophils Absolute: 0.1 10*3/uL (ref 0.0–0.5)
Eosinophils Relative: 1 %
HCT: 38.7 % — ABNORMAL LOW (ref 39.0–52.0)
Hemoglobin: 13.1 g/dL (ref 13.0–17.0)
Immature Granulocytes: 1 %
Lymphocytes Relative: 4 %
Lymphs Abs: 0.3 10*3/uL — ABNORMAL LOW (ref 0.7–4.0)
MCH: 31.6 pg (ref 26.0–34.0)
MCHC: 33.9 g/dL (ref 30.0–36.0)
MCV: 93.3 fL (ref 80.0–100.0)
Monocytes Absolute: 0.7 10*3/uL (ref 0.1–1.0)
Monocytes Relative: 8 %
Neutro Abs: 7.1 10*3/uL (ref 1.7–7.7)
Neutrophils Relative %: 86 %
Platelets: 121 10*3/uL — ABNORMAL LOW (ref 150–400)
RBC: 4.15 MIL/uL — ABNORMAL LOW (ref 4.22–5.81)
RDW: 13.2 % (ref 11.5–15.5)
WBC: 8.3 10*3/uL (ref 4.0–10.5)
nRBC: 0 % (ref 0.0–0.2)

## 2021-07-28 LAB — COMPREHENSIVE METABOLIC PANEL
ALT: 35 U/L (ref 0–44)
AST: 32 U/L (ref 15–41)
Albumin: 4 g/dL (ref 3.5–5.0)
Alkaline Phosphatase: 87 U/L (ref 38–126)
Anion gap: 8 (ref 5–15)
BUN: 24 mg/dL — ABNORMAL HIGH (ref 6–20)
CO2: 23 mmol/L (ref 22–32)
Calcium: 9.4 mg/dL (ref 8.9–10.3)
Chloride: 92 mmol/L — ABNORMAL LOW (ref 98–111)
Creatinine, Ser: 1.37 mg/dL — ABNORMAL HIGH (ref 0.61–1.24)
GFR, Estimated: >60 mL/min (ref >60)
Glucose, Bld: 106 mg/dL — ABNORMAL HIGH (ref 70–99)
Potassium: 5.6 mmol/L — ABNORMAL HIGH (ref 3.5–5.1)
Sodium: 123 mmol/L — ABNORMAL LOW (ref 135–145)
Total Bilirubin: 1.1 mg/dL (ref 0.3–1.2)
Total Protein: 8.2 g/dL — ABNORMAL HIGH (ref 6.5–8.1)

## 2021-07-28 LAB — LIPASE, BLOOD: Lipase: 53 U/L — ABNORMAL HIGH (ref 11–51)

## 2021-07-28 LAB — TSH: TSH: 1.168 u[IU]/mL (ref 0.350–4.500)

## 2021-07-28 NOTE — Patient Instructions (Signed)
Let's increase lactulose to 45 milliliters three times a day. Our goal is 3 bowel movements a day.   I am setting up for a HIDA scan as soon as possible. Please have blood blood work done today.   Further recommendations to follow!  I enjoyed seeing you again today! As you know, I value our relationship and want to provide genuine, compassionate, and quality care. I welcome your feedback. If you receive a survey regarding your visit,  I greatly appreciate you taking time to fill this out. See you next time!  Annitta Needs, PhD, ANP-BC [facility]

## 2021-07-28 NOTE — Progress Notes (Signed)
Referring Provider: Sofie Rower, PA-C Primary Care Physician:  Sofie Rower, PA-C Primary GI: Dr. Gala Romney  Chief Complaint  Patient presents with   Abdominal Pain    Across upper abd. Occurs when eating and sometimes wakes him up at night.    Diarrhea    Sometimes will have several bm's/day and some days doesn't have a bm   Cirrhosis    Some edema LE. Abd isn't retaining fluid    HPI:   Alejandro Porter is a 46 y.o. male presenting today with a history of cirrhosis complicated by portal hypertension, esophageal varices, portal vein thrombosis on Eliquis, hepatic encephalopathy on lactulose and Xfiaxan, ascites requiring repeat paras, history of SBP in April 2022, IDA followed by Hematology, and undergoing liver transplant eval at [facility]. He is here today due to postprandial abdominal pain.   Esophageal varices: not a candidate for non-selective blocker after history of SBP. Next EGD due in Jan 2024.   History of SBP: Cipro 500 mg daily for prophylactic purposes.   Lasix 20 mg BID and aldactone 100 mg BID. Recent labs Jan 2023 through Nephrology with Creatinine 1.61, BUN 23. Sodium 125.   Upper abdomen pain/RUQ pain and radiating down right side postprandially. Feels like pain is worsening. Used to be intermittently but now every time he eats, it is doing it. Has bloating and gas. GES normal. Feels nauseated.   Lactulose TID. Sometimes will have no BM, sometimes will go multiple times. Straining at times with having a BM.   Feels tired by the end of the day.   No overt GI bleeding. No mental status changes or confusion.      Past Medical History:  Diagnosis Date   CKD (chronic kidney disease) stage 2, GFR 60-89 ml/min    H/O colonoscopy    H/O endoscopy    Hypertension    Liver cirrhosis (HCC)    Neuropathy    OSA (obstructive sleep apnea)    S/P abdominal paracentesis     Past Surgical History:  Procedure Laterality Date   BIOPSY  09/12/2019    Procedure: BIOPSY;  Surgeon: Daneil Dolin, MD;  Location: [facility];  Service: Endoscopy;;   COLONOSCOPY N/A 09/18/2019   Dr. Oneida Alar: 12 colon polyps removed, multiple simple adenomas and some inflammatory polyps, diverticulosis, external and internal hemorrhoids.  Next colonoscopy in 3 years.   ESOPHAGEAL BANDING N/A 09/12/2019   Procedure: ESOPHAGEAL BANDING;  Surgeon: Daneil Dolin, MD;  Location: [facility];  Service: Endoscopy;  Laterality: N/A;   ESOPHAGOGASTRODUODENOSCOPY N/A 09/12/2019   Dr. Gala Romney: Mild erosive reflux esophagitis.  Schatzki ring status post dilation.  Small grade 1/grade 2 esophageal varices, portal hypertensive gastropathy, hyperplastic gastric polyp, gastric erosions with chronic inactive gastritis on biopsies, no H. pylori.   ESOPHAGOGASTRODUODENOSCOPY (EGD) WITH PROPOFOL N/A 12/16/2020   two short columns of no more than Grade 2 esophageal varices, appearing innocent. Grade 1 varices not apparent today. Multiple large posterior body and antral hyperplastic, hemorrhagic appearing polyps, larges approximately 2.5 cm pedunculated. Portal gastropathy. Normal duodenum. Polypectomy with clips placed. EGD in 18 months.   POLYPECTOMY  09/18/2019   Procedure: POLYPECTOMY;  Surgeon: Danie Binder, MD;  Location: [facility];  Service: Endoscopy;;   POLYPECTOMY  12/16/2020   Procedure: POLYPECTOMY;  Surgeon: Daneil Dolin, MD;  Location: [facility];  Service: Endoscopy;;  gastric    Current Outpatient Medications  Medication Sig Dispense Refill   apixaban (  ELIQUIS) 5 MG TABS tablet Take 1 tablet (5 mg total) by mouth 2 (two) times daily. 60 tablet 5   Cholecalciferol (VITAMIN D3) 125 MCG (5000 UT) TABS Take 5,000 Units by mouth daily.     ciprofloxacin (CIPRO) 500 MG tablet TAKE 1 TABLET BY MOUTH DAILY 30 tablet 5   CONSTULOSE 10 GM/15ML solution take 30 mls BY MOUTH THREE TIMES DAILY 946 mL 2   cyclobenzaprine (FLEXERIL) 5 MG tablet Take 5 mg by mouth 2  (two) times daily as needed for muscle spasms.     Ferrous Sulfate (IRON) 325 (65 Fe) MG TABS Take 1 tablet (325 mg total) by mouth daily. 30 tablet 0   furosemide (LASIX) 20 MG tablet Take 1 tablet (20 mg total) by mouth 2 (two) times daily. 60 tablet 2   gabapentin (NEURONTIN) 300 MG capsule Take 300 mg by mouth as needed (for pain).     levOCARNitine (CARNITOR) 330 MG tablet Take 330 mg by mouth 3 (three) times daily.     Multiple Vitamin (MULTIVITAMIN) tablet Take 1 tablet by mouth daily.     omeprazole (PRILOSEC) 20 MG capsule Take 1 capsule (20 mg total) by mouth daily. 30 capsule 5   ondansetron (ZOFRAN) 4 MG tablet Take 1 tablet (4 mg total) by mouth every 6 (six) hours as needed for nausea or vomiting. 30 tablet 1   rifaximin (XIFAXAN) 550 MG TABS tablet Take 1 tablet (550 mg total) by mouth 2 (two) times daily. 60 tablet 3   spironolactone (ALDACTONE) 100 MG tablet Take 100 mg by mouth 2 (two) times daily.     No current facility-administered medications for this visit.    Allergies as of 07/28/2021 - Review Complete 07/28/2021  Allergen Reaction Noted   Chlorthalidone  11/04/2019   Metoprolol  11/04/2019    Family History  Problem Relation Age of Onset   Brain cancer Mother    Diabetes Mother    Diabetes Father    Pulmonary embolism Brother    Liver disease Neg Hx     Social History   Socioeconomic History   Marital status: Single    Spouse name: Not on file   Number of children: Not on file   Years of education: Not on file   Highest education level: Not on file  Occupational History   Occupation: umemployed  Tobacco Use   Smoking status: Never   Smokeless tobacco: Never  Vaping Use   Vaping Use: Never used  Substance and Sexual Activity   Alcohol use: Never   Drug use: Never   Sexual activity: Not Currently  Other Topics Concern   Not on file  Social History Narrative   Not on file   Social Determinants of Health   Financial Resource Strain: Not on  file  Food Insecurity: Not on file  Transportation Needs: Not on file  Physical Activity: Not on file  Stress: Not on file  Social Connections: Not on file    Review of Systems: Gen: Denies fever, chills, anorexia. Denies fatigue, weakness, weight loss.  CV: Denies chest pain, palpitations, syncope, peripheral edema, and claudication. Resp: Denies dyspnea at rest, cough, wheezing, coughing up blood, and pleurisy. GI: see HPI Derm: Denies rash, itching, dry skin Psych: Denies depression, anxiety, memory loss, confusion. No homicidal or suicidal ideation.  Heme: Denies bruising, bleeding, and enlarged lymph nodes.  Physical Exam: BP (!) 144/81    Pulse 82    Temp (!) 97.5 F (36.4 C) (Temporal)  Ht 5' 8"  (1.727 m)    Wt 249 lb 9.6 oz (113.2 kg)    BMI 37.95 kg/m  General:   Alert and oriented. No distress noted. Pleasant and cooperative.  Head:  Normocephalic and atraumatic. Eyes:  Conjuctiva clear without scleral icterus. Mouth:  mask in place Abdomen:  +BS, soft, TTP LUQ/epigastric. Worsened TTP RUQ  and non-distended. No rebound or guarding. No HSM or masses noted. Msk:  Symmetrical without gross deformities. Normal posture. Extremities:  Without edema. Neurologic:  Alert and  oriented x4 Psych:  Alert and cooperative. Normal mood and affect.  ASSESSMENT: Morio Porter is a 46 y.o. male presenting today with a history of cirrhosis complicated by portal hypertension, esophageal varices, portal vein thrombosis on Eliquis, hepatic encephalopathy on lactulose and Xfiaxan, ascites requiring repeat paras, history of SBP in April 2022, IDA followed by Hematology, and undergoing liver transplant eval at [facility]. He is here today due to postprandial abdominal pain.   Abdominal pain: gallbladder in situ. Wall thickening noted on prior US but felt related to cirrhotic state. With his symptoms, there is concern for biliary etiology. Last EGD fairly recent (July 2022). Doubt  gastritis/PUD. Ordering labs today including lipase. HIDA scan ASAP.   Esophageal varices: not a candidate for non-selective blocker after history of SBP. Next EGD due in Jan 2024.   History of SBP: Cipro 500 mg daily for prophylactic purposes.   History of HE: not ideal results with Lactulose 30 ml TID. I have asked to increase to 45 ml TID with goal of 3 BMs per day. Continue Xifaxan.   Fatigue: ongoing. Labs including CBC ordered. TSH added. Last ferritin 45, CBC 13.5 in Jan 2023.   PLAN:  CBC, CMP, lipase, TSH HIDA ASAP To ED if worsening pain Increase lactulose to 45 ml TID EGD in Jan 2024 Cipro 500 mg daily Continue Xifaxan BID Prilosec daily for GERD: unable to wean off of this at this time 3 month follow-up   Annitta Needs, PhD, ANP-BC [facility]

## 2021-07-29 ENCOUNTER — Other Ambulatory Visit: Payer: Self-pay

## 2021-07-29 DIAGNOSIS — E875 Hyperkalemia: Secondary | ICD-10-CM

## 2021-08-01 ENCOUNTER — Other Ambulatory Visit (HOSPITAL_COMMUNITY)
Admission: RE | Admit: 2021-08-01 | Discharge: 2021-08-01 | Disposition: A | Discharge status: Home / Self Care | Payer: Medicaid Other | Source: Ambulatory Visit | Attending: Gastroenterology | Admitting: Gastroenterology

## 2021-08-01 DIAGNOSIS — E875 Hyperkalemia: Secondary | ICD-10-CM | POA: Insufficient documentation

## 2021-08-01 LAB — BASIC METABOLIC PANEL
Anion gap: 7 (ref 5–15)
BUN: 24 mg/dL — ABNORMAL HIGH (ref 6–20)
CO2: 23 mmol/L (ref 22–32)
Calcium: 8.9 mg/dL (ref 8.9–10.3)
Chloride: 92 mmol/L — ABNORMAL LOW (ref 98–111)
Creatinine, Ser: 1.35 mg/dL — ABNORMAL HIGH (ref 0.61–1.24)
GFR, Estimated: >60 mL/min (ref >60)
Glucose, Bld: 78 mg/dL (ref 70–99)
Potassium: 4.4 mmol/L (ref 3.5–5.1)
Sodium: 122 mmol/L — ABNORMAL LOW (ref 135–145)

## 2021-08-02 ENCOUNTER — Other Ambulatory Visit: Payer: Self-pay

## 2021-08-02 ENCOUNTER — Encounter (HOSPITAL_COMMUNITY)
Admission: RE | Admit: 2021-08-02 | Discharge: 2021-08-02 | Disposition: A | Discharge status: Home / Self Care | Payer: Medicaid Other | Source: Ambulatory Visit | Attending: Gastroenterology | Admitting: Gastroenterology

## 2021-08-02 ENCOUNTER — Encounter (HOSPITAL_COMMUNITY): Payer: Self-pay

## 2021-08-02 ENCOUNTER — Other Ambulatory Visit: Payer: Self-pay | Admitting: *Deleted

## 2021-08-02 DIAGNOSIS — R1011 Right upper quadrant pain: Secondary | ICD-10-CM

## 2021-08-02 DIAGNOSIS — K7460 Unspecified cirrhosis of liver: Secondary | ICD-10-CM

## 2021-08-02 MED ORDER — TECHNETIUM TC 99M MEBROFENIN IV KIT
5.0000 | PACK | Freq: Once | INTRAVENOUS | Status: AC | PRN
  Administered 2021-08-02: 5 via INTRAVENOUS

## 2021-08-03 ENCOUNTER — Encounter: Payer: Self-pay | Admitting: Nutrition

## 2021-08-03 NOTE — Progress Notes (Signed)
This came to my box and I am forwarding to you for review.

## 2021-08-03 NOTE — Progress Notes (Signed)
This note was put in my box and I'm forwarding to you for review.

## 2021-08-08 ENCOUNTER — Telehealth: Payer: Self-pay | Admitting: Internal Medicine

## 2021-08-08 ENCOUNTER — Emergency Department (HOSPITAL_COMMUNITY): Payer: Medicaid Other

## 2021-08-08 ENCOUNTER — Encounter (HOSPITAL_COMMUNITY): Payer: Self-pay

## 2021-08-08 ENCOUNTER — Other Ambulatory Visit (HOSPITAL_COMMUNITY)
Admission: RE | Admit: 2021-08-08 | Discharge: 2021-08-08 | Disposition: A | Discharge status: Home / Self Care | Payer: Medicaid Other | Source: Ambulatory Visit | Attending: Gastroenterology | Admitting: Gastroenterology

## 2021-08-08 ENCOUNTER — Other Ambulatory Visit: Payer: Self-pay

## 2021-08-08 ENCOUNTER — Emergency Department (HOSPITAL_COMMUNITY)
Admission: EM | Admit: 2021-08-08 | Discharge: 2021-08-08 | Disposition: A | Discharge status: Home / Self Care | Payer: Medicaid Other | Attending: Emergency Medicine | Admitting: Emergency Medicine

## 2021-08-08 DIAGNOSIS — K7460 Unspecified cirrhosis of liver: Secondary | ICD-10-CM | POA: Insufficient documentation

## 2021-08-08 DIAGNOSIS — N433 Hydrocele, unspecified: Secondary | ICD-10-CM | POA: Insufficient documentation

## 2021-08-08 DIAGNOSIS — R1084 Generalized abdominal pain: Secondary | ICD-10-CM | POA: Diagnosis present

## 2021-08-08 DIAGNOSIS — K4091 Unilateral inguinal hernia, without obstruction or gangrene, recurrent: Secondary | ICD-10-CM | POA: Diagnosis not present

## 2021-08-08 DIAGNOSIS — R609 Edema, unspecified: Secondary | ICD-10-CM

## 2021-08-08 DIAGNOSIS — R188 Other ascites: Secondary | ICD-10-CM | POA: Diagnosis not present

## 2021-08-08 LAB — BASIC METABOLIC PANEL
Anion gap: 10 (ref 5–15)
BUN: 18 mg/dL (ref 6–20)
CO2: 24 mmol/L (ref 22–32)
Calcium: 9.6 mg/dL (ref 8.9–10.3)
Chloride: 93 mmol/L — ABNORMAL LOW (ref 98–111)
Creatinine, Ser: 1.25 mg/dL — ABNORMAL HIGH (ref 0.61–1.24)
GFR, Estimated: >60 mL/min (ref >60)
Glucose, Bld: 104 mg/dL — ABNORMAL HIGH (ref 70–99)
Potassium: 5 mmol/L (ref 3.5–5.1)
Sodium: 127 mmol/L — ABNORMAL LOW (ref 135–145)

## 2021-08-08 NOTE — ED Provider Notes (Signed)
[facility] EMERGENCY DEPARTMENT Provider Note   CSN: [id number] Arrival date & time: 08/08/21  6834     History  Chief Complaint  Patient presents with   Inguinal Hernia    Alejandro Porter is a 46 y.o. male.  Patient with history inguinal hernia.  He has seen a Education officer, environmental here who felt like the patient needed to go to [facility] to have it repaired.  He is having increased pain.   Pt has a hx of cirrhosis   The history is provided by the patient and medical records. No language interpreter was used.  Abdominal Pain Pain location:  Generalized Pain quality: aching   Pain radiates to:  Does not radiate Pain severity:  Moderate Onset quality:  Gradual Timing:  Constant Progression:  Waxing and waning Chronicity:  Recurrent Context: not alcohol use   Relieved by:  Nothing Associated symptoms: no chest pain, no cough, no diarrhea, no fatigue and no hematuria       Home Medications Prior to Admission medications   Medication Sig Start Date End Date Taking? Authorizing Provider  apixaban (ELIQUIS) 5 MG TABS tablet Take 1 tablet (5 mg total) by mouth 2 (two) times daily. 09/22/19   Roxan Hockey, MD  Cholecalciferol (VITAMIN D3) 125 MCG (5000 UT) TABS Take 5,000 Units by mouth daily. 06/17/19   [provider]  ciprofloxacin (CIPRO) 500 MG tablet TAKE 1 TABLET BY MOUTH DAILY 07/19/21   Annitta Needs, NP  CONSTULOSE 10 GM/15ML solution take 30 mls BY MOUTH THREE TIMES DAILY 06/30/21   Erenest Rasher, PA-C  cyclobenzaprine (FLEXERIL) 5 MG tablet Take 5 mg by mouth 2 (two) times daily as needed for muscle spasms. 09/29/20   [provider]  Ferrous Sulfate (IRON) 325 (65 Fe) MG TABS Take 1 tablet (325 mg total) by mouth daily. 09/22/19   Roxan Hockey, MD  furosemide (LASIX) 20 MG tablet Take 1 tablet (20 mg total) by mouth 2 (two) times daily. 09/16/20   Roxan Hockey, MD  gabapentin (NEURONTIN) 300 MG capsule Take 300 mg by mouth as needed (for pain).     [provider]  levOCARNitine (CARNITOR) 330 MG tablet Take 330 mg by mouth 3 (three) times daily. 09/01/20   [provider]  Multiple Vitamin (MULTIVITAMIN) tablet Take 1 tablet by mouth daily.    [provider]  omeprazole (PRILOSEC) 20 MG capsule Take 1 capsule (20 mg total) by mouth daily. 09/22/19 07/28/21  Roxan Hockey, MD  ondansetron (ZOFRAN) 4 MG tablet Take 1 tablet (4 mg total) by mouth every 6 (six) hours as needed for nausea or vomiting. 01/17/21   Mahala Menghini, PA-C  rifaximin (XIFAXAN) 550 MG TABS tablet Take 1 tablet (550 mg total) by mouth 2 (two) times daily. 09/16/20   Roxan Hockey, MD  spironolactone (ALDACTONE) 100 MG tablet Take 100 mg by mouth 2 (two) times daily.    [provider]      Allergies    Chlorthalidone and Metoprolol    Review of Systems   Review of Systems  Constitutional:  Negative for appetite change and fatigue.  HENT:  Negative for congestion, ear discharge and sinus pressure.   Eyes:  Negative for discharge.  Respiratory:  Negative for cough.   Cardiovascular:  Negative for chest pain.  Gastrointestinal:  Positive for abdominal pain. Negative for diarrhea.  Genitourinary:  Negative for frequency and hematuria.  Musculoskeletal:  Negative for back pain.  Skin:  Negative for rash.  Neurological:  Negative for seizures and headaches.  Psychiatric/Behavioral:  Negative for hallucinations.    Physical Exam Updated Vital Signs BP (!) 141/101    Pulse 89    Temp 98.2 F (36.8 C) (Oral)    Resp 20    Ht 5' 8"  (1.727 m)    Wt 112.5 kg    SpO2 100%    BMI 37.71 kg/m  Physical Exam Constitutional:      Appearance: Normal appearance. He is well-developed.  HENT:     Head: Normocephalic.     Nose: Nose normal.  Eyes:     General: No scleral icterus.    Conjunctiva/sclera: Conjunctivae normal.  Neck:     Thyroid: No thyromegaly.  Cardiovascular:     Rate and Rhythm: Normal rate and regular rhythm.      Heart sounds: No murmur heard.   No friction rub. No gallop.  Pulmonary:     Breath sounds: No stridor. No wheezing or rales.  Chest:     Chest wall: No tenderness.  Abdominal:     General: There is no distension.     Tenderness: There is no abdominal tenderness. There is no rebound.  Genitourinary:    Comments: Right inguinal hernia Musculoskeletal:        General: Normal range of motion.     Cervical back: Neck supple.  Lymphadenopathy:     Cervical: No cervical adenopathy.  Skin:    Findings: No erythema or rash.  Neurological:     Mental Status: He is alert and oriented to person, place, and time.     Motor: No abnormal muscle tone.     Coordination: Coordination normal.  Psychiatric:        Behavior: Behavior normal.    ED Results / Procedures / Treatments   Labs (all labs ordered are listed, but only abnormal results are displayed) Labs Reviewed - No data to display  EKG None  Radiology US SCROTUM W/DOPPLER  Result Date: 08/08/2021 CLINICAL DATA:  RIGHT groin pain and swelling EXAM: SCROTAL ULTRASOUND DOPPLER ULTRASOUND OF THE TESTICLES TECHNIQUE: Complete ultrasound examination of the testicles, epididymis, and other scrotal structures was performed. Color and spectral Doppler ultrasound were also utilized to evaluate blood flow to the testicles. COMPARISON:  CT abdomen and pelvis 08/23/20 FINDINGS: Right testicle Measurements: 4.1 x 2.1 x 2.6 cm. Normal echogenicity without mass or calcification. Internal blood flow present on color Doppler imaging. Left testicle Measurements: 3.7 x 1.8 x 2.6 cm. Slightly inhomogeneous, nonspecific. No mass or calcification. Right epididymis:  Normal in size and appearance. Left epididymis:  Normal in size and appearance. Hydrocele: Trace hydroceles bilaterally. Varicocele:  None visualized. Pulsed Doppler interrogation of both testes demonstrates normal low resistance arterial and venous waveforms bilaterally. Noted is a large RIGHT  inguinal hernia distended by ascites containing debris. This hernia was present on the prior CT exam. IMPRESSION: No testicular or epididymal abnormalities. Large RIGHT inguinal hernia containing mildly complicated ascites. Electronically Signed   By: Lavonia Dana M.D.   On: 08/08/2021 12:12    Procedures Procedures    Medications Ordered in ED Medications - No data to display  ED Course/ Medical Decision Making/ A&P                           Medical Decision Making Amount and/or Complexity of Data Reviewed Radiology: ordered. ECG/medicine tests: ordered.  This patient presents to the ED for concern of abdominal pain, this  involves an extensive number of treatment options, and is a complaint that carries with it a high risk of complications and morbidity.  The differential diagnosis includes inguinal hernia, groin strain, appendicitis   Co morbidities that complicate the patient evaluation  Liver disease with ascites   Additional history obtained:  Additional history obtained from patient External records from outside source obtained and reviewed including hospital record   Lab Tests:  No labs Imaging Studies ordered:  I ordered imaging studies including ultrasound scrotum I independently visualized and interpreted imaging which showed large inguinal hernia without strangulation of intestine I agree with the radiologist interpretation   Cardiac Monitoring:  The patient was maintained on a cardiac monitor.  I personally viewed and interpreted the cardiac monitored which showed an underlying rhythm of: Normal sinus rhythm   Medicines ordered and prescription drug management:  No medicines ordered Test Considered:  CT abdomen   Critical Interventions:  None   Consultations Obtained:  No consult  Problem List / ED Course:  Inguinal hernia, and ascites    Social Determinants of Health:  None       Patient with right inguinal hernia.  No  obstruction or strangulation.  He will be referred to surgery to get it fixed       Final Clinical Impression(s) / ED Diagnoses Final diagnoses:  Unilateral recurrent inguinal hernia without obstruction or gangrene    Rx / DC Orders ED Discharge Orders     None         Milton Ferguson, MD 08/09/21 1724

## 2021-08-08 NOTE — Telephone Encounter (Signed)
Recall for ultrasound 

## 2021-08-08 NOTE — ED Triage Notes (Signed)
Patient states inguinal hernia that has spread down right leg. Patient states he cant stand for Emeree Mahler periods of time due to hernia falling down.  ?

## 2021-08-08 NOTE — Discharge Instructions (Signed)
Make an appointment with [facility] surgery in [place] ?

## 2021-08-09 ENCOUNTER — Telehealth: Payer: Self-pay | Admitting: Internal Medicine

## 2021-08-09 NOTE — Telephone Encounter (Signed)
Pt said he was told to call back in a week if he hadn't heard from a transportation place to take him to [facility] for a test. Please call [phone] ?

## 2021-08-09 NOTE — Telephone Encounter (Signed)
Hey ?I phoned the pt and he stated he was told to call us back if he had not heard from a general surgeon yet because he has to let his transportation know ahead of time. He was wondering did a referral get sent in? ?

## 2021-08-09 NOTE — Telephone Encounter (Signed)
Pt ha done January and to repeat in 6 months.  ?

## 2021-08-09 NOTE — Telephone Encounter (Signed)
Pt was made aware and was given [facility]'s number  ?

## 2021-08-09 NOTE — Telephone Encounter (Signed)
noted 

## 2021-08-09 NOTE — Telephone Encounter (Signed)
Yes referral is in review at [facility]. They will call once approved. ?

## 2021-08-26 ENCOUNTER — Telehealth: Payer: Self-pay | Admitting: Cardiology

## 2021-08-26 NOTE — Telephone Encounter (Signed)
? ?  Pre-operative Risk Assessment  ?  ?Patient Name: Alejandro Porter  ?DOB: 1976/03/16 ?MRN: [id number]  ? ?  ? ?Request for Surgical Clearance   ? ?Procedure:   Laparoscopic Cholecystectomy ? ?Date of Surgery:  Clearance TBD                               ?Surgeon:  Reather Laurence, MD ?Surgeon's Group or Practice Name:  [facility] ?Phone number:  [phone] ?Fax number:  [phone] ?  ?Type of Clearance Requested:   ?- Medical  ?- Pharmacy:  Hold Apixaban (Eliquis)   ?  ?Type of Anesthesia:  General  ?  ?Additional requests/questions:  Please advise surgeon/provider what medications should be held. ?Please fax a copy of cardiac clearance to Emeline Gins, CMA to the surgeon's office.@ [phone] ? ?Signed, ?Desma Paganini   ?08/26/2021, 8:45 AM  ?

## 2021-08-27 ENCOUNTER — Other Ambulatory Visit: Payer: Self-pay | Admitting: Gastroenterology

## 2021-08-30 NOTE — Telephone Encounter (Signed)
? ?  Name: Alejandro Porter  ?DOB: Oct 24, 1975  ?MRN: [id number] ? ?Primary Cardiologist: Rozann Lesches, MD ? ?Chart reviewed as part of pre-operative protocol coverage. At last OV in 12/2020 was describing chest discomfort with reassuring echo and monitor; no prior ischemic eval noted; workup felt reassuring at that time. Given patient's past medical history/comorbidities and unclear who is following anticoagulation at this point (Dr. Denton Brick is hospitalist), would suggest in-office visit for pre-operative evaluation since last office visit was > 6 months ago.  ? ?Pre-op covering staff: ?- Please schedule appointment and call patient to inform them. ?- Please contact requesting surgeon's office via preferred method (i.e, phone, fax) to inform them of need for appointment prior to surgery. ? ?Re: anticoagulation, not managed by cardiology, but need to sort out details of who is managing when seen in visit. ? ?Charlie Pitter, PA-C  ?08/30/2021, 8:09 AM  ? ?

## 2021-08-30 NOTE — Telephone Encounter (Signed)
Patient is on Eliquis for portal vein thrombosis. This is being managed by Dr. Denton Brick. I will defer to Dr. Denton Brick for clearance to hold Eliquis. ? ?

## 2021-09-04 NOTE — Progress Notes (Signed)
? ? ?Cardiology Office Note ? ?Date: 09/05/2021  ? ?ID: Alejandro Porter, DOB [birth year], MRN [id number] ? ?PCP:  Sofie Rower, PA-C  ?Cardiologist:  Rozann Lesches, MD ?Electrophysiologist:  None  ? ?No chief complaint on file. ? ? ?History of Present Illness: ?Alejandro Porter is a 46 y.o. male seen in consultation back in July 2022.  He is referred back to the office for preoperative cardiac evaluation prior to anticipated laparoscopic cholecystectomy under general anesthesia.  He has no clearly documented history of obstructive CAD or myocardial infarction with abnormal ECG at baseline but normal LVEF by assessment in July 2022, 55 to 60% range without regional wall motion abnormalities.  Also no history of significant cardiac arrhythmias based on monitor from last year. ? ?He reports intermittent thoracic and abdominal discomfort, not all exertional, some periprandial.  He has been trying to walk for exercise, usually at least twice a week for 30 minutes and states that he gets his heart rate up to 120 by a fitness tracker.  He does not describe clear-cut angina with that activity. ? ?He is followed by gastroenterology (Dr. 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  It is requested that Eliquis be held prior to surgery. ? ?RCRI perioperative cardiac risk calculator indicates class II, 0.9% chance of major adverse cardiac event.  His overall risk is higher in light of comorbidities. ? ?I personally reviewed his ECG today which shows sinus rhythm with increased voltage, Q waves in lead III and aVF which are old. ? ?Past Medical History:  ?Diagnosis Date  ? CKD (chronic kidney disease) stage 2, GFR 60-89 ml/min   ? H/O colonoscopy   ? H/O endoscopy   ? Hypertension   ? Liver cirrhosis ([place])   ? Neuropathy   ? OSA (obstructive sleep apnea)   ? S/P abdominal paracentesis   ? ? ?Past  Surgical History:  ?Procedure Laterality Date  ? BIOPSY  09/12/2019  ? Procedure: BIOPSY;  Surgeon: Daneil Dolin, MD;  [facility];  Service: Endoscopy;;  ? COLONOSCOPY N/A 09/18/2019  ? Dr. Oneida Alar: 12 colon polyps removed, multiple simple adenomas and some inflammatory polyps, diverticulosis, external and internal hemorrhoids.  Next colonoscopy in 3 years.  ? ESOPHAGEAL BANDING N/A 09/12/2019  ? Procedure: ESOPHAGEAL BANDING;  Surgeon: Daneil Dolin, MD;  [facility];  Service: Endoscopy;  Laterality: N/A;  ? ESOPHAGOGASTRODUODENOSCOPY N/A 09/12/2019  ? Dr. Gala Romney: Mild erosive reflux esophagitis.  Schatzki ring status post dilation.  Small grade 1/grade 2 esophageal varices, portal hypertensive gastropathy, hyperplastic gastric polyp, gastric erosions with chronic inactive gastritis on biopsies, no H. pylori.  ? ESOPHAGOGASTRODUODENOSCOPY (EGD) WITH PROPOFOL N/A 12/16/2020  ? two short columns of no more than Grade 2 esophageal varices, appearing innocent. Grade 1 varices not apparent today. Multiple large posterior body and antral hyperplastic, hemorrhagic appearing polyps, larges approximately 2.5 cm pedunculated. Portal gastropathy. Normal duodenum. Polypectomy with clips placed. EGD in 18 months.  ? POLYPECTOMY  09/18/2019  ? Procedure: POLYPECTOMY;  Surgeon: Danie Binder, MD;  [facility];  Service: Endoscopy;;  ? POLYPECTOMY  12/16/2020  ? Procedure: POLYPECTOMY;  Surgeon: Daneil Dolin, MD;  [facility];  Service: Endoscopy;;  gastric  ? ? ?Current Outpatient Medications  ?Medication Sig Dispense Refill  ? apixaban (ELIQUIS) 5 MG TABS tablet Take 1 tablet (5 mg total) by mouth 2 (two) times  daily. 60 tablet 5  ? Cholecalciferol (VITAMIN D3) 125 MCG (5000 UT) TABS Take 5,000 Units by mouth daily.    ? ciprofloxacin (CIPRO) 500 MG tablet TAKE 1 TABLET BY MOUTH DAILY 30 tablet 5  ? cyclobenzaprine (FLEXERIL) 5 MG tablet Take 5 mg by mouth 2 (two) times  daily as needed for muscle spasms.    ? Ferrous Sulfate (IRON) 325 (65 Fe) MG TABS Take 1 tablet (325 mg total) by mouth daily. 30 tablet 0  ? furosemide (LASIX) 20 MG tablet Take 1 tablet (20 mg total) by mouth 2 (two) times daily. 60 tablet 2  ? gabapentin (NEURONTIN) 300 MG capsule Take 300 mg by mouth as needed (for pain).    ? lactulose (CONSTULOSE) 10 GM/15ML solution Take 45 mLs (30 g total) by mouth 3 (three) times daily. 946 mL 2  ? levOCARNitine (CARNITOR) 330 MG tablet Take 330 mg by mouth 3 (three) times daily.    ? Multiple Vitamin (MULTIVITAMIN) tablet Take 1 tablet by mouth daily.    ? omeprazole (PRILOSEC) 20 MG capsule Take 1 capsule (20 mg total) by mouth daily. 30 capsule 5  ? ondansetron (ZOFRAN) 4 MG tablet Take 1 tablet (4 mg total) by mouth every 6 (six) hours as needed for nausea or vomiting. 30 tablet 1  ? rifaximin (XIFAXAN) 550 MG TABS tablet Take 1 tablet (550 mg total) by mouth 2 (two) times daily. 60 tablet 3  ? spironolactone (ALDACTONE) 100 MG tablet Take 100 mg by mouth 2 (two) times daily.    ? ?No current facility-administered medications for this visit.  ? ?Allergies:  Chlorthalidone and Metoprolol  ? ?Social History: The patient  reports that he has never smoked. He has never used smokeless tobacco. He reports that he does not drink alcohol and does not use drugs.  ? ?Family History: The patient's family history includes Brain cancer in his mother; Diabetes in his father and mother; Pulmonary embolism in his brother.  ? ?ROS: No progressive palpitations or syncope. ? ?Physical Exam: ?VS:  BP 132/82   Pulse 90   Ht 5' 8"  (1.727 m)   Wt 261 lb (118.4 kg)   SpO2 99%   BMI 39.68 kg/m? , BMI Body mass index is 39.68 kg/m?. ? ?Wt Readings from Last 3 Encounters:  ?09/05/21 261 lb (118.4 kg)  ?08/08/21 248 lb (112.5 kg)  ?07/28/21 249 lb 9.6 oz (113.2 kg)  ?  ?General: Chronically ill-appearing male in no distress. ?HEENT: Conjunctiva and lids normal, oropharynx clear. ?Neck:  Supple, no elevated JVP or carotid bruits, no thyromegaly. ?Lungs: Clear to auscultation, nonlabored breathing at rest. ?Cardiac: Regular rate and rhythm, no S3, 1/6 systolic murmur, no pericardial rub. ?Abdomen: Protuberant, bowel sounds present, no guarding or rebound. ?Extremities: Chronic appearing lower leg edema, distal pulses 2+. ?Skin: Warm and dry. ?Musculoskeletal: No kyphosis. ?Neuropsychiatric: Alert and oriented x3, affect grossly appropriate. ? ?ECG:  An ECG dated 12/13/2020 was personally reviewed today and demonstrated:  Sinus rhythm with borderline increased voltage. ? ?Recent Labwork: ?09/15/2020: Magnesium 2.0 ?10/11/2020: B Natriuretic Peptide 10.0 ?07/28/2021: ALT 35; AST 32; Hemoglobin 13.1; Platelets 121; TSH 1.168 ?08/08/2021: BUN 18; Creatinine, Ser 1.25; Potassium 5.0; Sodium 127  ? ?Other Studies Reviewed Today: ? ?Echocardiogram 12/31/2020: ? 1. Left ventricular ejection fraction, by estimation, is 55 to 60%. The  ?left ventricle has normal function. The left ventricle has no regional  ?wall motion abnormalities. Left ventricular diastolic parameters were  ?normal.  ? 2. Right ventricular  systolic function is normal. The right ventricular  ?size is normal. There is normal pulmonary artery systolic pressure.  ? 3. The mitral valve is normal in structure. Trivial mitral valve  ?regurgitation. No evidence of mitral stenosis.  ? 4. The aortic valve is tricuspid. Aortic valve regurgitation is not  ?visualized. No aortic stenosis is present.  ? 5. The inferior vena cava is normal in size with greater than 50%  ?respiratory variability, suggesting right atrial pressure of 3 mmHg. ? ?Cardiac monitor August 2022: ?ZIO XT reviewed.  5 days, 7 hours analyzed.  Predominant rhythm is sinus with heart rate ranging from 60 bpm up to 134 bpm and average heart rate 85 bpm.  There were rare PACs including couplets representing less than 1% total beats.  There were rare PVCs including couplets representing less  than 1% total beats.  No significant arrhythmias or pauses. ? ?Assessment and Plan: ? ?1.  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

## 2021-09-05 ENCOUNTER — Ambulatory Visit: Payer: Medicaid Other | Admitting: Cardiology

## 2021-09-05 ENCOUNTER — Encounter: Payer: Self-pay | Admitting: Cardiology

## 2021-09-05 VITALS — BP 132/82 | HR 90 | Ht 68.0 in | Wt 261.0 lb

## 2021-09-05 DIAGNOSIS — Z01810 Encounter for preprocedural cardiovascular examination: Secondary | ICD-10-CM

## 2021-09-05 DIAGNOSIS — I81 Portal vein thrombosis: Secondary | ICD-10-CM

## 2021-09-05 NOTE — Patient Instructions (Signed)
Follow-Up: ?Follow up with Dr. Domenic Polite in 6 months.  ? ?Any Other Special Instructions Will Be Listed Below (If Applicable). ? ? ? ? ?If you need a refill on your cardiac medications before your next appointment, please call your pharmacy. ? ?

## 2021-09-13 ENCOUNTER — Encounter: Payer: Self-pay | Admitting: Gastroenterology

## 2021-09-13 ENCOUNTER — Other Ambulatory Visit: Payer: Self-pay | Admitting: Nurse Practitioner

## 2021-09-13 DIAGNOSIS — K5900 Constipation, unspecified: Secondary | ICD-10-CM

## 2021-09-13 DIAGNOSIS — K7031 Alcoholic cirrhosis of liver with ascites: Secondary | ICD-10-CM

## 2021-09-13 DIAGNOSIS — I81 Portal vein thrombosis: Secondary | ICD-10-CM

## 2021-09-13 DIAGNOSIS — K766 Portal hypertension: Secondary | ICD-10-CM

## 2021-09-13 NOTE — Progress Notes (Signed)
Patient may hold Eliquis 48-72 hours prior to upcoming procedure from a GI standpoint.  ?

## 2021-09-14 ENCOUNTER — Inpatient Hospital Stay (HOSPITAL_COMMUNITY): Payer: Medicaid Other | Attending: Hematology

## 2021-09-14 DIAGNOSIS — D696 Thrombocytopenia, unspecified: Secondary | ICD-10-CM

## 2021-09-14 DIAGNOSIS — D509 Iron deficiency anemia, unspecified: Secondary | ICD-10-CM | POA: Diagnosis present

## 2021-09-14 DIAGNOSIS — D508 Other iron deficiency anemias: Secondary | ICD-10-CM

## 2021-09-14 DIAGNOSIS — E875 Hyperkalemia: Secondary | ICD-10-CM

## 2021-09-14 DIAGNOSIS — N182 Chronic kidney disease, stage 2 (mild): Secondary | ICD-10-CM | POA: Diagnosis not present

## 2021-09-14 LAB — FERRITIN: Ferritin: 27 ng/mL (ref 24–336)

## 2021-09-14 LAB — CBC WITH DIFFERENTIAL/PLATELET
Abs Immature Granulocytes: 0.04 10*3/uL (ref 0.00–0.07)
Basophils Absolute: 0 10*3/uL (ref 0.0–0.1)
Basophils Relative: 0 %
Eosinophils Absolute: 0.2 10*3/uL (ref 0.0–0.5)
Eosinophils Relative: 3 %
HCT: 39.1 % (ref 39.0–52.0)
Hemoglobin: 13.2 g/dL (ref 13.0–17.0)
Immature Granulocytes: 1 %
Lymphocytes Relative: 6 %
Lymphs Abs: 0.4 10*3/uL — ABNORMAL LOW (ref 0.7–4.0)
MCH: 31.7 pg (ref 26.0–34.0)
MCHC: 33.8 g/dL (ref 30.0–36.0)
MCV: 94 fL (ref 80.0–100.0)
Monocytes Absolute: 0.5 10*3/uL (ref 0.1–1.0)
Monocytes Relative: 8 %
Neutro Abs: 5.4 10*3/uL (ref 1.7–7.7)
Neutrophils Relative %: 82 %
Platelets: 98 10*3/uL — ABNORMAL LOW (ref 150–400)
RBC: 4.16 MIL/uL — ABNORMAL LOW (ref 4.22–5.81)
RDW: 13.5 % (ref 11.5–15.5)
WBC: 6.5 10*3/uL (ref 4.0–10.5)
nRBC: 0 % (ref 0.0–0.2)

## 2021-09-14 LAB — IRON AND TIBC
Iron: 114 ug/dL (ref 45–182)
Saturation Ratios: 26 % (ref 17.9–39.5)
TIBC: 432 ug/dL (ref 250–450)
UIBC: 318 ug/dL

## 2021-09-15 NOTE — Progress Notes (Signed)
? ?Alejandro Porter ?[address] ?[place], [place] [address] ? ? ?CLINIC:  ?Medical Oncology/Hematology ? ?PCP:  ?[place], O'Laf, PA-C ?[address] ?[place] [address] ?[phone] ? ? ?REASON FOR VISIT:  ?Follow-up for iron deficiency anemia, thrombocytopenia (cirrhosis), and portal vein thrombosis ? ?CURRENT THERAPY: Intermittent IV iron, Eliquis ? ?INTERVAL HISTORY:  ?Alejandro Porter 46 y.o. male returns for routine follow-up of his iron deficiency anemia, thrombocytopenia, and portal vein thrombosis.  He was last seen by Dr. Delton Coombes on 06/16/2021. ? ?At today's visit, he reports feeling somewhat poorly, as he reports he has been having issues with his gallbladder lately with symptoms of nausea, diarrhea, and abdominal pain. ? ?Regarding his thrombocytopenia and anemia, he denies any bright red blood per rectum, but does admit to blackish watery diarrhea intermittently.  He admits to easy bruising but denies petechial rash.  He continues to take his Eliquis for portal vein thrombosis.  He denies any B symptoms such as fever, chills, night sweats, unintentional weight loss. ?He reports increased fatigue with energy about 60%.  He denies any pica, restless legs, headaches, chest pain, dyspnea on exertion, lightheadedness, and syncope. ? ?He has 60% energy and 100% appetite. He endorses that he is maintaining a stable weight. ? ? ? ?REVIEW OF SYSTEMS:  ?Review of Systems  ?Constitutional:  Positive for fatigue. Negative for appetite change, chills, diaphoresis, fever and unexpected weight change.  ?HENT:   Negative for lump/mass and nosebleeds.   ?Eyes:  Negative for eye problems.  ?Respiratory:  Positive for cough. Negative for hemoptysis and shortness of breath.   ?Cardiovascular:  Negative for chest pain, leg swelling and palpitations.  ?Gastrointestinal:  Positive for abdominal pain, diarrhea and nausea. Negative for blood in stool, constipation and vomiting.  ?Genitourinary:  Negative for  hematuria.   ?Skin: Negative.   ?Neurological:  Negative for dizziness, headaches and light-headedness.  ?Hematological:  Does not bruise/bleed easily.  ?Psychiatric/Behavioral:  Positive for sleep disturbance.    ? ? ?PAST MEDICAL/SURGICAL HISTORY:  ?Past Medical History:  ?Diagnosis Date  ? CKD (chronic kidney disease) stage 2, GFR 60-89 ml/min   ? H/O colonoscopy   ? H/O endoscopy   ? Hypertension   ? Liver cirrhosis ([place])   ? Neuropathy   ? OSA (obstructive sleep apnea)   ? S/P abdominal paracentesis   ? ?Past Surgical History:  ?Procedure Laterality Date  ? BIOPSY  09/12/2019  ? Procedure: BIOPSY;  Surgeon: Daneil Dolin, MD;  [facility];  Service: Endoscopy;;  ? COLONOSCOPY N/A 09/18/2019  ? Dr. Oneida Alar: 12 colon polyps removed, multiple simple adenomas and some inflammatory polyps, diverticulosis, external and internal hemorrhoids.  Next colonoscopy in 3 years.  ? ESOPHAGEAL BANDING N/A 09/12/2019  ? Procedure: ESOPHAGEAL BANDING;  Surgeon: Daneil Dolin, MD;  [facility];  Service: Endoscopy;  Laterality: N/A;  ? ESOPHAGOGASTRODUODENOSCOPY N/A 09/12/2019  ? Dr. Gala Romney: Mild erosive reflux esophagitis.  Schatzki ring status post dilation.  Small grade 1/grade 2 esophageal varices, portal hypertensive gastropathy, hyperplastic gastric polyp, gastric erosions with chronic inactive gastritis on biopsies, no H. pylori.  ? ESOPHAGOGASTRODUODENOSCOPY (EGD) WITH PROPOFOL N/A 12/16/2020  ? two short columns of no more than Grade 2 esophageal varices, appearing innocent. Grade 1 varices not apparent today. Multiple large posterior body and antral hyperplastic, hemorrhagic appearing polyps, larges approximately 2.5 cm pedunculated. Portal gastropathy. Normal duodenum. Polypectomy with clips placed. EGD in 18 months.  ? POLYPECTOMY  09/18/2019  ?  Procedure: POLYPECTOMY;  Surgeon: Danie Binder, MD;  [facility];  Service: Endoscopy;;  ? POLYPECTOMY  12/16/2020  ? Procedure:  POLYPECTOMY;  Surgeon: Daneil Dolin, MD;  [facility];  Service: Endoscopy;;  gastric  ? ? ? ?SOCIAL HISTORY:  ?Social History  ? ?Socioeconomic History  ? Marital status: Single  ?  Spouse name: Not on file  ? Number of children: Not on file  ? Years of education: Not on file  ? Highest education level: Not on file  ?Occupational History  ? Occupation: umemployed  ?Tobacco Use  ? Smoking status: Never  ? Smokeless tobacco: Never  ?Vaping Use  ? Vaping Use: Never used  ?Substance and Sexual Activity  ? Alcohol use: Never  ? Drug use: Never  ? Sexual activity: Not Currently  ?Other Topics Concern  ? Not on file  ?Social History Narrative  ? Not on file  ? ?Social Determinants of Health  ? ?Financial Resource Strain: Not on file  ?Food Insecurity: Not on file  ?Transportation Needs: Not on file  ?Physical Activity: Not on file  ?Stress: Not on file  ?Social Connections: Not on file  ?Intimate Partner Violence: Not on file  ? ? ?FAMILY HISTORY:  ?Family History  ?Problem Relation Age of Onset  ? Brain cancer Mother   ? Diabetes Mother   ? Diabetes Father   ? Pulmonary embolism Brother   ? Liver disease Neg Hx   ? ? ?CURRENT MEDICATIONS:  ?Outpatient Encounter Medications as of 09/16/2021  ?Medication Sig  ? apixaban (ELIQUIS) 5 MG TABS tablet Take 1 tablet (5 mg total) by mouth 2 (two) times daily.  ? Cholecalciferol (VITAMIN D3) 125 MCG (5000 UT) TABS Take 5,000 Units by mouth daily.  ? ciprofloxacin (CIPRO) 500 MG tablet TAKE 1 TABLET BY MOUTH DAILY  ? cyclobenzaprine (FLEXERIL) 5 MG tablet Take 5 mg by mouth 2 (two) times daily as needed for muscle spasms.  ? Ferrous Sulfate (IRON) 325 (65 Fe) MG TABS Take 1 tablet (325 mg total) by mouth daily.  ? furosemide (LASIX) 20 MG tablet Take 1 tablet (20 mg total) by mouth 2 (two) times daily.  ? gabapentin (NEURONTIN) 300 MG capsule Take 300 mg by mouth as needed (for pain).  ? lactulose (CONSTULOSE) 10 GM/15ML solution Take 45 mLs (30 g total) by mouth 3  (three) times daily.  ? levOCARNitine (CARNITOR) 330 MG tablet Take 330 mg by mouth 3 (three) times daily.  ? Multiple Vitamin (MULTIVITAMIN) tablet Take 1 tablet by mouth daily.  ? omeprazole (PRILOSEC) 20 MG capsule Take 1 capsule (20 mg total) by mouth daily.  ? ondansetron (ZOFRAN) 4 MG tablet Take 1 tablet (4 mg total) by mouth every 6 (six) hours as needed for nausea or vomiting.  ? spironolactone (ALDACTONE) 100 MG tablet Take 100 mg by mouth 2 (two) times daily.  ? XIFAXAN 550 MG TABS tablet TAKE 1 TABLET BY MOUTH TWICE DAILY  ? ?No facility-administered encounter medications on file as of 09/16/2021.  ? ? ?ALLERGIES:  ?Allergies  ?Allergen Reactions  ? Chlorthalidone   ?  Severe headaches   ? Metoprolol   ?  Severe headaches   ? ? ? ?PHYSICAL EXAM:  ?ECOG PERFORMANCE STATUS: 1 - Symptomatic but completely ambulatory ? ?There were no vitals filed for this visit. ?There were no vitals filed for this visit. ?Physical Exam ?Constitutional:   ?   Appearance: Normal appearance. He is obese.  ?HENT:  ?  Head: Normocephalic and atraumatic.  ?   Mouth/Throat:  ?   Mouth: Mucous membranes are moist.  ?Eyes:  ?   Extraocular Movements: Extraocular movements intact.  ?   Pupils: Pupils are equal, round, and reactive to light.  ?Cardiovascular:  ?   Rate and Rhythm: Normal rate and regular rhythm.  ?   Pulses: Normal pulses.  ?   Heart sounds: Normal heart sounds.  ?Pulmonary:  ?   Effort: Pulmonary effort is normal.  ?   Breath sounds: Normal breath sounds.  ?Abdominal:  ?   General: Bowel sounds are normal. There is distension (soft but moderately distended).  ?   Palpations: Abdomen is soft.  ?   Tenderness: There is no abdominal tenderness.  ?Musculoskeletal:     ?   General: No swelling.  ?   Right lower leg: No edema.  ?   Left lower leg: No edema.  ?Lymphadenopathy:  ?   Cervical: No cervical adenopathy.  ?Skin: ?   General: Skin is warm and dry.  ?Neurological:  ?   General: No focal deficit present.  ?   Mental  Status: He is alert and oriented to person, place, and time.  ?Psychiatric:     ?   Mood and Affect: Mood normal.     ?   Behavior: Behavior normal.  ? ? ? ?LABORATORY DATA:  ?I have reviewed the labs as list

## 2021-09-16 ENCOUNTER — Inpatient Hospital Stay (HOSPITAL_COMMUNITY): Payer: Medicaid Other | Admitting: Physician Assistant

## 2021-09-16 VITALS — BP 131/83 | HR 86 | Temp 98.4°F | Resp 18 | Ht 68.0 in | Wt 258.9 lb

## 2021-09-16 DIAGNOSIS — D508 Other iron deficiency anemias: Secondary | ICD-10-CM | POA: Diagnosis not present

## 2021-09-16 DIAGNOSIS — D696 Thrombocytopenia, unspecified: Secondary | ICD-10-CM | POA: Diagnosis not present

## 2021-09-16 DIAGNOSIS — D509 Iron deficiency anemia, unspecified: Secondary | ICD-10-CM | POA: Diagnosis not present

## 2021-09-16 DIAGNOSIS — E875 Hyperkalemia: Secondary | ICD-10-CM

## 2021-09-16 NOTE — Patient Instructions (Addendum)
[place] at [facility] ?Discharge Instructions ? ?You were seen today by Tarri Abernethy PA-C for your iron deficiency anemia, low platelets, and portal vein thrombosis (blood clot in your liver). ? ?IRON DEFICIENCY ANEMIA: Your iron levels are low, which may be part of why you feel so tired.  We will schedule you for IV iron infusions x3 doses. ? ?LOW PLATELETS: You have low platelets because of your liver cirrhosis.  They are mildly low, but stable at your usual baseline.  You do not need treatment for that at this time. ? ?PORTAL VEIN THROMBOSIS (blood clot in your liver): Continue Eliquis 5 mg twice daily.  If you need to stop Eliquis for any upcoming surgeries, contact our office so that we can give official clearance to hold Eliquis for 48-72 hours. ? ?LABS: Return in 4 months for repeat labs ? ?FOLLOW-UP APPOINTMENT: Office visit after labs ? ? ?Thank you for choosing [place] at [facility] to provide your oncology and hematology care.  To afford each patient quality time with our provider, please arrive at least 15 minutes before your scheduled appointment time.  ? ?If you have a lab appointment with the [facility] please come in thru the Main Entrance and check in at the main information desk. ? ?You need to re-schedule your appointment should you arrive 10 or more minutes late.  We strive to give you quality time with our providers, and arriving late affects you and other patients whose appointments are after yours.  Also, if you no show three or more times for appointments you may be dismissed from the clinic at the providers discretion.     ?Again, thank you for choosing [facility].  Our hope is that these requests will decrease the amount of time that you wait before being seen by our physicians.       ?_____________________________________________________________ ? ?Should you have questions after your visit to [facility], please contact our office at [phone] and follow the prompts.  Our office hours are 8:00 a.m. and 4:30 p.m. Monday - Friday.  Please note that voicemails left after 4:00 p.m. may not be returned until the following business day.  We are closed weekends and major holidays.  You do have access to a nurse 24-7, just call the main number to the clinic [phone] and do not press any options, hold on the line and a nurse will answer the phone.   ? ?For prescription refill requests, have your pharmacy contact our office and allow 72 hours.   ? ?Due to Covid, you will need to wear a mask upon entering the hospital. If you do not have a mask, a mask will be given to you at the Main Entrance upon arrival. For doctor visits, patients may have 1 support person age 28 or older with them. For treatment visits, patients can not have anyone with them due to social distancing guidelines and our immunocompromised population.  ? ? ? ?

## 2021-09-20 ENCOUNTER — Encounter (HOSPITAL_COMMUNITY): Payer: Self-pay

## 2021-09-20 ENCOUNTER — Inpatient Hospital Stay (HOSPITAL_COMMUNITY): Payer: Medicaid Other

## 2021-09-20 VITALS — BP 106/77 | HR 81 | Temp 97.7°F | Resp 18

## 2021-09-20 DIAGNOSIS — D509 Iron deficiency anemia, unspecified: Secondary | ICD-10-CM | POA: Diagnosis not present

## 2021-09-20 DIAGNOSIS — D508 Other iron deficiency anemias: Secondary | ICD-10-CM

## 2021-09-20 MED ORDER — LORATADINE 10 MG PO TABS
10.0000 mg | ORAL_TABLET | Freq: Once | ORAL | Status: AC
  Administered 2021-09-20: 10 mg via ORAL
  Filled 2021-09-20: qty 1

## 2021-09-20 MED ORDER — SODIUM CHLORIDE 0.9 % IV SOLN
300.0000 mg | Freq: Once | INTRAVENOUS | Status: AC
  Administered 2021-09-20: 300 mg via INTRAVENOUS
  Filled 2021-09-20: qty 300

## 2021-09-20 MED ORDER — SODIUM CHLORIDE 0.9 % IV SOLN: Freq: Once | INTRAVENOUS | Status: AC

## 2021-09-20 NOTE — Patient Instructions (Signed)
[facility]  Discharge Instructions: Thank you for choosing [facility] to provide your oncology and hematology care.  If you have a lab appointment with the Cancer Center, please come in thru the Main Entrance and check in at the main information desk.  Wear comfortable clothing and clothing appropriate for easy access to any Portacath or PICC line.   We strive to give you quality time with your provider. You may need to reschedule your appointment if you arrive late (15 or more minutes).  Arriving late affects you and other patients whose appointments are after yours.  Also, if you miss three or more appointments without notifying the office, you may be dismissed from the clinic at the provider's discretion.      For prescription refill requests, have your pharmacy contact our office and allow 72 hours for refills to be completed.        To help prevent nausea and vomiting after your treatment, we encourage you to take your nausea medication as directed.  BELOW ARE SYMPTOMS THAT SHOULD BE REPORTED IMMEDIATELY: *FEVER GREATER THAN 100.4 F (38 C) OR HIGHER *CHILLS OR SWEATING *NAUSEA AND VOMITING THAT IS NOT CONTROLLED WITH YOUR NAUSEA MEDICATION *UNUSUAL SHORTNESS OF BREATH *UNUSUAL BRUISING OR BLEEDING *URINARY PROBLEMS (pain or burning when urinating, or frequent urination) *BOWEL PROBLEMS (unusual diarrhea, constipation, pain near the anus) TENDERNESS IN MOUTH AND THROAT WITH OR WITHOUT PRESENCE OF ULCERS (sore throat, sores in mouth, or a toothache) UNUSUAL RASH, SWELLING OR PAIN  UNUSUAL VAGINAL DISCHARGE OR ITCHING   Items with * indicate a potential emergency and should be followed up as soon as possible or go to the Emergency Department if any problems should occur.  Please show the CHEMOTHERAPY ALERT CARD or IMMUNOTHERAPY ALERT CARD at check-in to the Emergency Department and triage nurse.  Should you have questions after your visit or need to cancel  or reschedule your appointment, please contact [facility] [phone]  and follow the prompts.  Office hours are 8:00 a.m. to 4:30 p.m. Monday - Friday. Please note that voicemails left after 4:00 p.m. may not be returned until the following business day.  We are closed weekends and major holidays. You have access to a nurse at all times for urgent questions. Please call the main number to the clinic [phone] and follow the prompts.  For any non-urgent questions, you may also contact your provider using MyChart. We now offer e-Visits for anyone 18 and older to request care online for non-urgent symptoms. For details visit [email or web address].   Also download the MyChart app! Go to the app store, search "MyChart", open the app, select [facility], and log in with your MyChart username and password.  Due to Covid, a mask is required upon entering the hospital/clinic. If you do not have a mask, one will be given to you upon arrival. For doctor visits, patients may have 1 support person aged 18 or older with them. For treatment visits, patients cannot have anyone with them due to current Covid guidelines and our immunocompromised population.  

## 2021-09-20 NOTE — Progress Notes (Signed)
Chaplain engaged in an initial visit with Alejandro Porter.  Alejandro Porter shared about his faith, love for God, and the loss his family has suffered.  He talked about the loss of his mom to cancer and the death of his younger brother.  Through it all Alejandro Porter continued to thank God for him being here.  He also talked about his healthcare journey of low iron, needing gallbladder surgery, and having a pinched nurse.   ? ?Chaplain offered reflective listening and spiritual care support.  ? ? ? 09/20/21 1100  ?Clinical Encounter Type  ?Visited With Patient  ?Visit Type Initial;Spiritual support  ? ?  ?

## 2021-09-20 NOTE — Progress Notes (Signed)
Patient tolerated iron infusion with no complaints voiced.  Peripheral IV site clean and dry with good blood return noted before and after infusion.  Band aid applied.  VSS with discharge and left in satisfactory condition with no s/s of distress noted.   ?

## 2021-09-29 ENCOUNTER — Inpatient Hospital Stay (HOSPITAL_COMMUNITY): Payer: Medicaid Other

## 2021-09-29 VITALS — BP 123/66 | HR 86 | Temp 98.4°F | Resp 18

## 2021-09-29 DIAGNOSIS — D508 Other iron deficiency anemias: Secondary | ICD-10-CM

## 2021-09-29 DIAGNOSIS — D509 Iron deficiency anemia, unspecified: Secondary | ICD-10-CM | POA: Diagnosis not present

## 2021-09-29 MED ORDER — SODIUM CHLORIDE 0.9 % IV SOLN
300.0000 mg | Freq: Once | INTRAVENOUS | Status: AC
  Administered 2021-09-29: 300 mg via INTRAVENOUS
  Filled 2021-09-29: qty 300

## 2021-09-29 MED ORDER — SODIUM CHLORIDE 0.9 % IV SOLN: Freq: Once | INTRAVENOUS | Status: AC

## 2021-09-29 MED ORDER — LORATADINE 10 MG PO TABS
10.0000 mg | ORAL_TABLET | Freq: Once | ORAL | Status: AC
  Administered 2021-09-29: 10 mg via ORAL
  Filled 2021-09-29: qty 1

## 2021-09-29 NOTE — Progress Notes (Signed)
Pt presents today for Venofer IV iron infusion per provider's order. Vital signs stable and pt voiced no new complaints at this time. ? ?Peripheral IV started with good blood return pre and post infusion. ? ?Venofer given today per MD orders. Tolerated infusion without adverse affects. Vital signs stable. No complaints at this time. Discharged from clinic ambulatory in stable condition. Alert and oriented x 3. F/U with [facility] as scheduled.   ?

## 2021-09-29 NOTE — Patient Instructions (Signed)
Alejandro Porter  Discharge Instructions: ?Thank you for choosing [facility] to provide your oncology and hematology care.  ?If you have a lab appointment with the [facility], please come in thru the Main Entrance and check in at the main information desk. ? ?Wear comfortable clothing and clothing appropriate for easy access to any Portacath or PICC line.  ? ?We strive to give you quality time with your provider. You may need to reschedule your appointment if you arrive late (15 or more minutes).  Arriving late affects you and other patients whose appointments are after yours.  Also, if you miss three or more appointments without notifying the office, you may be dismissed from the clinic at the provider?s discretion.    ?  ?For prescription refill requests, have your pharmacy contact our office and allow 72 hours for refills to be completed.   ? ?Today you received the Venofer  ?  ? ? ?BELOW ARE SYMPTOMS THAT SHOULD BE REPORTED IMMEDIATELY: ?*FEVER GREATER THAN 100.4 F (38 ?C) OR HIGHER ?*CHILLS OR SWEATING ?*NAUSEA AND VOMITING THAT IS NOT CONTROLLED WITH YOUR NAUSEA MEDICATION ?*UNUSUAL SHORTNESS OF BREATH ?*UNUSUAL BRUISING OR BLEEDING ?*URINARY PROBLEMS (pain or burning when urinating, or frequent urination) ?*BOWEL PROBLEMS (unusual diarrhea, constipation, pain near the anus) ?TENDERNESS IN MOUTH AND THROAT WITH OR WITHOUT PRESENCE OF ULCERS (sore throat, sores in mouth, or a toothache) ?UNUSUAL RASH, SWELLING OR PAIN  ?UNUSUAL VAGINAL DISCHARGE OR ITCHING  ? ?Items with * indicate a potential emergency and should be followed up as soon as possible or go to the Emergency Department if any problems should occur. ? ?Please show the CHEMOTHERAPY ALERT CARD or IMMUNOTHERAPY ALERT CARD at check-in to the Emergency Department and triage nurse. ? ?Should you have questions after your visit or need to cancel or reschedule your appointment, please contact [facility] [phone]   and follow the prompts.  Office hours are 8:00 a.m. to 4:30 p.m. Monday - Friday. Please note that voicemails left after 4:00 p.m. may not be returned until the following business day.  We are closed weekends and major holidays. You have access to a nurse at all times for urgent questions. Please call the main number to the clinic [phone] and follow the prompts. ? ?For any non-urgent questions, you may also contact your provider using MyChart. We now offer e-Visits for anyone 78 and older to request care online for non-urgent symptoms. For details visit [email or web address]. ?  ?Also download the MyChart app! Go to the app store, search "MyChart", open the app, select [facility], and log in with your MyChart username and password. ? ?Due to Covid, a mask is required upon entering the hospital/clinic. If you do not have a mask, one will be given to you upon arrival. For doctor visits, patients may have 1 support person aged 78 or older with them. For treatment visits, patients cannot have anyone with them due to current Covid guidelines and our immunocompromised population.  ?

## 2021-10-04 ENCOUNTER — Ambulatory Visit: Payer: Medicaid Other | Admitting: Gastroenterology

## 2021-10-04 ENCOUNTER — Other Ambulatory Visit: Payer: Self-pay | Admitting: Gastroenterology

## 2021-10-04 ENCOUNTER — Other Ambulatory Visit (HOSPITAL_COMMUNITY)
Admission: RE | Admit: 2021-10-04 | Discharge: 2021-10-04 | Disposition: A | Discharge status: Home / Self Care | Payer: Medicaid Other | Source: Ambulatory Visit | Attending: Gastroenterology | Admitting: Gastroenterology

## 2021-10-04 ENCOUNTER — Ambulatory Visit: Payer: Medicaid Other | Admitting: Cardiology

## 2021-10-04 ENCOUNTER — Encounter: Payer: Self-pay | Admitting: Gastroenterology

## 2021-10-04 VITALS — BP 110/60 | HR 96 | Temp 97.1°F | Ht 68.0 in | Wt 261.0 lb

## 2021-10-04 DIAGNOSIS — R1011 Right upper quadrant pain: Secondary | ICD-10-CM | POA: Diagnosis not present

## 2021-10-04 DIAGNOSIS — K7460 Unspecified cirrhosis of liver: Secondary | ICD-10-CM | POA: Diagnosis not present

## 2021-10-04 DIAGNOSIS — K219 Gastro-esophageal reflux disease without esophagitis: Secondary | ICD-10-CM | POA: Diagnosis not present

## 2021-10-04 LAB — CBC WITH DIFFERENTIAL/PLATELET
Abs Immature Granulocytes: 0.04 10*3/uL (ref 0.00–0.07)
Basophils Absolute: 0 10*3/uL (ref 0.0–0.1)
Basophils Relative: 0 %
Eosinophils Absolute: 0.2 10*3/uL (ref 0.0–0.5)
Eosinophils Relative: 2 %
HCT: 39.4 % (ref 39.0–52.0)
Hemoglobin: 13.4 g/dL (ref 13.0–17.0)
Immature Granulocytes: 1 %
Lymphocytes Relative: 5 %
Lymphs Abs: 0.4 10*3/uL — ABNORMAL LOW (ref 0.7–4.0)
MCH: 31.3 pg (ref 26.0–34.0)
MCHC: 34 g/dL (ref 30.0–36.0)
MCV: 92.1 fL (ref 80.0–100.0)
Monocytes Absolute: 0.7 10*3/uL (ref 0.1–1.0)
Monocytes Relative: 9 %
Neutro Abs: 6.6 10*3/uL (ref 1.7–7.7)
Neutrophils Relative %: 83 %
Platelets: 95 10*3/uL — ABNORMAL LOW (ref 150–400)
RBC: 4.28 MIL/uL (ref 4.22–5.81)
RDW: 14.3 % (ref 11.5–15.5)
WBC: 7.9 10*3/uL (ref 4.0–10.5)
nRBC: 0 % (ref 0.0–0.2)

## 2021-10-04 LAB — COMPREHENSIVE METABOLIC PANEL
ALT: 36 U/L (ref 0–44)
AST: 37 U/L (ref 15–41)
Albumin: 3.9 g/dL (ref 3.5–5.0)
Alkaline Phosphatase: 89 U/L (ref 38–126)
Anion gap: 8 (ref 5–15)
BUN: 22 mg/dL — ABNORMAL HIGH (ref 6–20)
CO2: 23 mmol/L (ref 22–32)
Calcium: 9.2 mg/dL (ref 8.9–10.3)
Chloride: 95 mmol/L — ABNORMAL LOW (ref 98–111)
Creatinine, Ser: 1.26 mg/dL — ABNORMAL HIGH (ref 0.61–1.24)
GFR, Estimated: >60 mL/min (ref >60)
Glucose, Bld: 126 mg/dL — ABNORMAL HIGH (ref 70–99)
Potassium: 5 mmol/L (ref 3.5–5.1)
Sodium: 126 mmol/L — ABNORMAL LOW (ref 135–145)
Total Bilirubin: 1 mg/dL (ref 0.3–1.2)
Total Protein: 8.1 g/dL (ref 6.5–8.1)

## 2021-10-04 LAB — PROTIME-INR
INR: 1.2 (ref 0.8–1.2)
Prothrombin Time: 14.6 seconds (ref 11.4–15.2)

## 2021-10-04 MED ORDER — PANTOPRAZOLE SODIUM 40 MG PO TBEC: 40.0000 mg | DELAYED_RELEASE_TABLET | Freq: Every day | ORAL | 3 refills | Status: DC

## 2021-10-04 MED ORDER — ONDANSETRON HCL 4 MG PO TABS: 4.0000 mg | ORAL_TABLET | Freq: Four times a day (QID) | ORAL | 3 refills | Status: DC | PRN

## 2021-10-04 NOTE — Progress Notes (Signed)
Gastroenterology Office Note     Primary Care Physician:  Sofie Rower, PA-C  Primary Gastroenterologist: Dr. Gala Romney   Chief Complaint   Chief Complaint  Patient presents with   Follow-up    cirrhosis     History of Present Illness   Alejandro Porter is a 46 y.o. male presenting today in follow-up with a history of cirrhosis complicated by portal hypertension, esophageal varices, portal vein thrombosis on Eliquis, hepatic encephalopathy on lactulose and Xfiaxan, ascites requiring repeat paras, history of SBP in April 2022, IDA followed by Hematology, and undergoing liver transplant eval at [facility].  Esophageal varices: not a candidate for non-selective blocker after history of SBP. Next EGD due in Jan 2024.   History of SBP: Cipro 500 mg daily for prophylactic purposes.   Evaluated by General Surgery due to biliary dyskinesia and felt to be high risk. He continues with postprandial RUQ pain. No mental status changes or confusion. No jaundice or pruritis. No overt GI bleeding. Intermittent nausea post-prandially.      Past Medical History:  Diagnosis Date   CKD (chronic kidney disease) stage 2, GFR 60-89 ml/min    H/O colonoscopy    H/O endoscopy    Hypertension    Liver cirrhosis (HCC)    Neuropathy    OSA (obstructive sleep apnea)    S/P abdominal paracentesis     Past Surgical History:  Procedure Laterality Date   BIOPSY  09/12/2019   Procedure: BIOPSY;  Surgeon: Daneil Dolin, MD;  [facility];  Service: Endoscopy;;   COLONOSCOPY N/A 09/18/2019   Dr. Oneida Alar: 12 colon polyps removed, multiple simple adenomas and some inflammatory polyps, diverticulosis, external and internal hemorrhoids.  Next colonoscopy in 3 years.   ESOPHAGEAL BANDING N/A 09/12/2019   Procedure: ESOPHAGEAL BANDING;  Surgeon: Daneil Dolin, MD;  Location: [facility];  Service: Endoscopy;  Laterality: N/A;   ESOPHAGOGASTRODUODENOSCOPY N/A 09/12/2019   Dr.  Gala Romney: Mild erosive reflux esophagitis.  Schatzki ring status post dilation.  Small grade 1/grade 2 esophageal varices, portal hypertensive gastropathy, hyperplastic gastric polyp, gastric erosions with chronic inactive gastritis on biopsies, no H. pylori.   ESOPHAGOGASTRODUODENOSCOPY (EGD) WITH PROPOFOL N/A 12/16/2020   two short columns of no more than Grade 2 esophageal varices, appearing innocent. Grade 1 varices not apparent today. Multiple large posterior body and antral hyperplastic, hemorrhagic appearing polyps, larges approximately 2.5 cm pedunculated. Portal gastropathy. Normal duodenum. Polypectomy with clips placed. EGD in 18 months.   POLYPECTOMY  09/18/2019   Procedure: POLYPECTOMY;  Surgeon: Danie Binder, MD;  Location: [facility];  Service: Endoscopy;;   POLYPECTOMY  12/16/2020   Procedure: POLYPECTOMY;  Surgeon: Daneil Dolin, MD;  Location: [facility];  Service: Endoscopy;;  gastric    Current Outpatient Medications  Medication Sig Dispense Refill   apixaban (ELIQUIS) 5 MG TABS tablet Take 1 tablet (5 mg total) by mouth 2 (two) times daily. 60 tablet 5   Cholecalciferol (VITAMIN D3) 125 MCG (5000 UT) TABS Take 5,000 Units by mouth daily.     ciprofloxacin (CIPRO) 500 MG tablet TAKE 1 TABLET BY MOUTH DAILY 30 tablet 5   cyclobenzaprine (FLEXERIL) 10 MG tablet Take 10 mg by mouth 3 (three) times daily as needed.     cyclobenzaprine (FLEXERIL) 5 MG tablet Take 5 mg by mouth 2 (two) times daily as needed for muscle spasms.     Ferrous Sulfate (IRON) 325 (65 Fe) MG TABS Take  1 tablet (325 mg total) by mouth daily. 30 tablet 0   furosemide (LASIX) 20 MG tablet Take 1 tablet (20 mg total) by mouth 2 (two) times daily. 60 tablet 2   gabapentin (NEURONTIN) 300 MG capsule Take 300 mg by mouth as needed (for pain).     lactulose (CONSTULOSE) 10 GM/15ML solution Take 45 mLs (30 g total) by mouth 3 (three) times daily. 946 mL 2   levOCARNitine (CARNITOR) 330 MG tablet Take 330 mg  by mouth 3 (three) times daily.     Multiple Vitamin (MULTIVITAMIN) tablet Take 1 tablet by mouth daily.     ondansetron (ZOFRAN) 4 MG tablet Take 1 tablet (4 mg total) by mouth every 6 (six) hours as needed for nausea or vomiting. 30 tablet 3   pantoprazole (PROTONIX) 40 MG tablet Take 1 tablet (40 mg total) by mouth daily. 30 minutes before breakfast. Take instead of omeprazole 90 tablet 3   spironolactone (ALDACTONE) 100 MG tablet Take 100 mg by mouth 2 (two) times daily.     XIFAXAN 550 MG TABS tablet TAKE 1 TABLET BY MOUTH TWICE DAILY 180 tablet 3   omeprazole (PRILOSEC) 20 MG capsule Take 1 capsule (20 mg total) by mouth daily. 30 capsule 5   No current facility-administered medications for this visit.    Allergies as of 10/04/2021 - Review Complete 10/04/2021  Allergen Reaction Noted   Chlorthalidone  11/04/2019   Metoprolol  11/04/2019    Family History  Problem Relation Age of Onset   Brain cancer Mother    Diabetes Mother    Diabetes Father    Pulmonary embolism Brother    Liver disease Neg Hx     Social History   Socioeconomic History   Marital status: Single    Spouse name: Not on file   Number of children: Not on file   Years of education: Not on file   Highest education level: Not on file  Occupational History   Occupation: umemployed  Tobacco Use   Smoking status: Never   Smokeless tobacco: Never  Vaping Use   Vaping Use: Never used  Substance and Sexual Activity   Alcohol use: Never   Drug use: Never   Sexual activity: Not Currently  Other Topics Concern   Not on file  Social History Narrative   Not on file   Social Determinants of Health   Financial Resource Strain: Not on file  Food Insecurity: Not on file  Transportation Needs: Not on file  Physical Activity: Not on file  Stress: Not on file  Social Connections: Not on file  Intimate Partner Violence: Not on file     Review of Systems   Gen: Denies any fever, chills, fatigue, weight  loss, lack of appetite.  CV: Denies chest pain, heart palpitations, peripheral edema, syncope.  Resp: Denies shortness of breath at rest or with exertion. Denies wheezing or cough.  GI: see HPI GU : Denies urinary burning, urinary frequency, urinary hesitancy MS: Denies joint pain, muscle weakness, cramps, or limitation of movement.  Derm: Denies rash, itching, dry skin Psych: Denies depression, anxiety, memory loss, and confusion Heme: Denies bruising, bleeding, and enlarged lymph nodes.   Physical Exam   BP 110/60   Pulse 96   Temp (!) 97.1 F (36.2 C)   Ht 5' 8"  (1.727 m)   Wt 261 lb (118.4 kg)   BMI 39.68 kg/m  General:   Alert and oriented. Pleasant and cooperative. Well-nourished and well-developed.  Head:  Normocephalic and atraumatic. Eyes:  Without icterus Abdomen:  +BS, soft, mild TTP RUQ and non-distended. No HSM noted. No guarding or rebound. No masses appreciated.  Rectal:  Deferred  Msk:  Symmetrical without gross deformities. Normal posture. Extremities:  Without edema. Neurologic:  Alert and  oriented x4;  grossly normal neurologically. Skin:  Intact without significant lesions or rashes. Psych:  Alert and cooperative. Normal mood and affect.   Assessment   Alejandro Porter is a 46 y.o. male presenting today in follow-up with a history of cirrhosis complicated by portal hypertension, esophageal varices, portal vein thrombosis on Eliquis, hepatic encephalopathy on lactulose and Xfiaxan, ascites requiring repeat paras, history of SBP in April 2022, IDA followed by Hematology, and undergoing liver transplant eval at [facility].  Cirrhosis: Fairly well-compensated. Due for labs now, which we have ordered. US abdomen due in July 2023. EGD due in Jan 2024 for variceal surveillance. Not a candidate for non-selective beta blocker following SBP.  Hx of SBP: remains on Cipro 500 mg daily  GERD: unable to come off PPI. Will change from omeprazole to  pantoprazole.  RUQ pain: due to biliary dyskinesia. Unfortunately, he is high risk for surgical intervention. Declining tertiary surgical evaluation. Zofran sent in for nausea.   History of multiple polyps: surveillance due in 2024   PLAN  Cipro 500 mg  daily Lasix 20 mg BID, aldactone 168m BID Continue lactulose and Xifaxan Change from omeprazole to pantoprazole 40 mg daily Zofran prn [facility] 2023 Labs today 6 month follow-up   AAnnitta Needs PhD, ANP-BC [facility]

## 2021-10-04 NOTE — Patient Instructions (Signed)
I have sent in Zofran for nausea to take as needed. ? ?I have changed omeprazole to pantoprazole. Take this once daily, 30 minutes before breakfast.  ? ?Please have blood work done today.. ? ?Please call if pain worsens. ? ?We will see you in 6 months! ? ?I will review with Dr. Gala Romney any other options. ? ?I enjoyed seeing you again today! As you know, I value our relationship and want to provide genuine, compassionate, and quality care. I welcome your feedback. If you receive a survey regarding your visit,  I greatly appreciate you taking time to fill this out. See you next time! ? ?Annitta Needs, PhD, ANP-BC ?[facility]  ? ?

## 2021-10-06 ENCOUNTER — Inpatient Hospital Stay (HOSPITAL_COMMUNITY): Payer: Medicaid Other | Attending: Hematology

## 2021-10-06 VITALS — BP 103/72 | HR 73 | Temp 98.6°F | Resp 18

## 2021-10-06 DIAGNOSIS — D509 Iron deficiency anemia, unspecified: Secondary | ICD-10-CM | POA: Insufficient documentation

## 2021-10-06 DIAGNOSIS — D508 Other iron deficiency anemias: Secondary | ICD-10-CM

## 2021-10-06 MED ORDER — LORATADINE 10 MG PO TABS
10.0000 mg | ORAL_TABLET | Freq: Once | ORAL | Status: AC
  Administered 2021-10-06: 10 mg via ORAL
  Filled 2021-10-06: qty 1

## 2021-10-06 MED ORDER — SODIUM CHLORIDE 0.9 % IV SOLN
300.0000 mg | Freq: Once | INTRAVENOUS | Status: AC
  Administered 2021-10-06: 300 mg via INTRAVENOUS
  Filled 2021-10-06: qty 300

## 2021-10-06 MED ORDER — SODIUM CHLORIDE 0.9 % IV SOLN: Freq: Once | INTRAVENOUS | Status: AC

## 2021-10-06 NOTE — Patient Instructions (Signed)
[facility]  Discharge Instructions: ?Thank you for choosing [facility] to provide your oncology and hematology care.  ?If you have a lab appointment with the [facility], please come in thru the Main Entrance and check in at the main information desk. ? ?Wear comfortable clothing and clothing appropriate for easy access to any Portacath or PICC line.  ? ?We strive to give you quality time with your provider. You may need to reschedule your appointment if you arrive late (15 or more minutes).  Arriving late affects you and other patients whose appointments are after yours.  Also, if you miss three or more appointments without notifying the office, you may be dismissed from the clinic at the provider?s discretion.    ?  ?For prescription refill requests, have your pharmacy contact our office and allow 72 hours for refills to be completed.   ? ?Today you received the following chemotherapy and/or immunotherapy agents Venofer    ?  ?To help prevent nausea and vomiting after your treatment, we encourage you to take your nausea medication as directed. ? ?BELOW ARE SYMPTOMS THAT SHOULD BE REPORTED IMMEDIATELY: ?*FEVER GREATER THAN 100.4 F (38 ?C) OR HIGHER ?*CHILLS OR SWEATING ?*NAUSEA AND VOMITING THAT IS NOT CONTROLLED WITH YOUR NAUSEA MEDICATION ?*UNUSUAL SHORTNESS OF BREATH ?*UNUSUAL BRUISING OR BLEEDING ?*URINARY PROBLEMS (pain or burning when urinating, or frequent urination) ?*BOWEL PROBLEMS (unusual diarrhea, constipation, pain near the anus) ?TENDERNESS IN MOUTH AND THROAT WITH OR WITHOUT PRESENCE OF ULCERS (sore throat, sores in mouth, or a toothache) ?UNUSUAL RASH, SWELLING OR PAIN  ?UNUSUAL VAGINAL DISCHARGE OR ITCHING  ? ?Items with * indicate a potential emergency and should be followed up as soon as possible or go to the Emergency Department if any problems should occur. ? ?Please show the CHEMOTHERAPY ALERT CARD or IMMUNOTHERAPY ALERT CARD at check-in to the Emergency  Department and triage nurse. ? ?Should you have questions after your visit or need to cancel or reschedule your appointment, please contact [facility] [phone]  and follow the prompts.  Office hours are 8:00 a.m. to 4:30 p.m. Monday - Friday. Please note that voicemails left after 4:00 p.m. may not be returned until the following business day.  We are closed weekends and major holidays. You have access to a nurse at all times for urgent questions. Please call the main number to the clinic [phone] and follow the prompts. ? ?For any non-urgent questions, you may also contact your provider using MyChart. We now offer e-Visits for anyone 39 and older to request care online for non-urgent symptoms. For details visit [email or web address]. ?  ?Also download the MyChart app! Go to the app store, search "MyChart", open the app, select [facility], and log in with your MyChart username and password. ? ?Due to Covid, a mask is required upon entering the hospital/clinic. If you do not have a mask, one will be given to you upon arrival. For doctor visits, patients may have 1 support person aged 48 or older with them. For treatment visits, patients cannot have anyone with them due to current Covid guidelines and our immunocompromised population.  ?

## 2021-10-06 NOTE — Progress Notes (Signed)
Patient presents today for Venofer infusion per providers order.  Vital signs WNL.   ? ?Peripheral IV started and blood return noted pre and post infusion.   ? ?Venofer given today per MD orders.  Stable during infusion without adverse affects.  Vital signs stable.  No complaints at this time.  Discharge from clinic ambulatory in stable condition.  Alert and oriented X 3.  Follow up with [facility] as scheduled.  ?

## 2021-10-07 LAB — AFP TUMOR MARKER: AFP, Serum, Tumor Marker: 2.1 ng/mL (ref 0.0–6.9)

## 2021-10-25 ENCOUNTER — Telehealth: Payer: Self-pay

## 2021-10-25 NOTE — Telephone Encounter (Signed)
Pt phoned advising that his Left leg is 4 times bigger than the Right leg.he states that he has so much fluid built up his R side leg is numb, his abdomen hurts and the fluid is backing up into his chest making his chest hurts. Please advise

## 2021-10-25 NOTE — Telephone Encounter (Signed)
This is concerning due to the unilateral swelling of left leg. Needs to go to the ED. History of cirrhosis complicated by SBP in the past.

## 2021-10-25 NOTE — Telephone Encounter (Signed)
FYI:  Phoned and advised the pt of the result note. Pt states he is on his way to the ED

## 2021-11-09 ENCOUNTER — Telehealth: Payer: Self-pay | Admitting: *Deleted

## 2021-11-09 NOTE — Telephone Encounter (Signed)
Phoned and spoke with the pt. Advised of the note. Pt declined once again because he said that he was told by [facility] that they would not take it out. He stated that [facility] told him the same thing. So pt stated he guess he will continue to hurt. I advised the pt that if he changed his mind to please call us back. Pt agreed

## 2021-11-09 NOTE — Telephone Encounter (Signed)
Spoke to pt, he informed me that his stomach hurts all the time. He stated that even when he doesn't eat, his stomach hurts. Would like to know what can be done to help him?

## 2021-11-09 NOTE — Telephone Encounter (Signed)
I recommend tertiary referral for consideration of cholecystectomy. He has already been seen by [facility] but is high risk for surgery. He has known biliary dyskinesia. He did not want to be referred at last visit. If he does, that is what I recommend.

## 2021-11-16 ENCOUNTER — Other Ambulatory Visit: Payer: Self-pay | Admitting: Nurse Practitioner

## 2021-11-16 ENCOUNTER — Other Ambulatory Visit (HOSPITAL_COMMUNITY): Payer: Self-pay | Admitting: Nurse Practitioner

## 2021-11-16 DIAGNOSIS — K7469 Other cirrhosis of liver: Secondary | ICD-10-CM

## 2021-11-28 ENCOUNTER — Ambulatory Visit: Payer: Medicaid Other | Admitting: Pulmonary Disease

## 2021-11-28 ENCOUNTER — Encounter: Payer: Self-pay | Admitting: Pulmonary Disease

## 2021-11-28 VITALS — BP 134/90 | HR 92 | Temp 97.9°F | Ht 68.0 in | Wt 270.2 lb

## 2021-11-28 DIAGNOSIS — G4733 Obstructive sleep apnea (adult) (pediatric): Secondary | ICD-10-CM

## 2021-11-28 DIAGNOSIS — Z9989 Dependence on other enabling machines and devices: Secondary | ICD-10-CM | POA: Diagnosis not present

## 2021-11-29 ENCOUNTER — Ambulatory Visit (HOSPITAL_COMMUNITY)
Admission: RE | Admit: 2021-11-29 | Discharge: 2021-11-29 | Disposition: A | Discharge status: Home / Self Care | Payer: Medicaid Other | Source: Ambulatory Visit | Attending: Nurse Practitioner | Admitting: Nurse Practitioner

## 2021-11-29 ENCOUNTER — Other Ambulatory Visit (HOSPITAL_COMMUNITY)
Admission: RE | Admit: 2021-11-29 | Discharge: 2021-11-29 | Disposition: A | Discharge status: Home / Self Care | Payer: Medicaid Other | Source: Ambulatory Visit | Attending: Nurse Practitioner | Admitting: Nurse Practitioner

## 2021-11-29 DIAGNOSIS — K7469 Other cirrhosis of liver: Secondary | ICD-10-CM | POA: Diagnosis present

## 2021-11-29 LAB — COMPREHENSIVE METABOLIC PANEL
ALT: 37 U/L (ref 0–44)
AST: 37 U/L (ref 15–41)
Albumin: 3.7 g/dL (ref 3.5–5.0)
Alkaline Phosphatase: 88 U/L (ref 38–126)
Anion gap: 9 (ref 5–15)
BUN: 17 mg/dL (ref 6–20)
CO2: 22 mmol/L (ref 22–32)
Calcium: 9 mg/dL (ref 8.9–10.3)
Chloride: 93 mmol/L — ABNORMAL LOW (ref 98–111)
Creatinine, Ser: 1.36 mg/dL — ABNORMAL HIGH (ref 0.61–1.24)
GFR, Estimated: >60 mL/min (ref >60)
Glucose, Bld: 99 mg/dL (ref 70–99)
Potassium: 4.6 mmol/L (ref 3.5–5.1)
Sodium: 124 mmol/L — ABNORMAL LOW (ref 135–145)
Total Bilirubin: 1.1 mg/dL (ref 0.3–1.2)
Total Protein: 7.5 g/dL (ref 6.5–8.1)

## 2021-11-29 LAB — CBC WITH DIFFERENTIAL/PLATELET
Abs Immature Granulocytes: 0.05 10*3/uL (ref 0.00–0.07)
Basophils Absolute: 0 10*3/uL (ref 0.0–0.1)
Basophils Relative: 0 %
Eosinophils Absolute: 0.2 10*3/uL (ref 0.0–0.5)
Eosinophils Relative: 2 %
HCT: 37.3 % — ABNORMAL LOW (ref 39.0–52.0)
Hemoglobin: 13 g/dL (ref 13.0–17.0)
Immature Granulocytes: 1 %
Lymphocytes Relative: 4 %
Lymphs Abs: 0.3 10*3/uL — ABNORMAL LOW (ref 0.7–4.0)
MCH: 32.4 pg (ref 26.0–34.0)
MCHC: 34.9 g/dL (ref 30.0–36.0)
MCV: 93 fL (ref 80.0–100.0)
Monocytes Absolute: 0.7 10*3/uL (ref 0.1–1.0)
Monocytes Relative: 10 %
Neutro Abs: 6.1 10*3/uL (ref 1.7–7.7)
Neutrophils Relative %: 83 %
Platelets: 102 10*3/uL — ABNORMAL LOW (ref 150–400)
RBC: 4.01 MIL/uL — ABNORMAL LOW (ref 4.22–5.81)
RDW: 13.6 % (ref 11.5–15.5)
WBC: 7.4 10*3/uL (ref 4.0–10.5)
nRBC: 0 % (ref 0.0–0.2)

## 2021-11-29 LAB — PROTIME-INR
INR: 1.3 — ABNORMAL HIGH (ref 0.8–1.2)
Prothrombin Time: 16.2 seconds — ABNORMAL HIGH (ref 11.4–15.2)

## 2021-11-30 ENCOUNTER — Other Ambulatory Visit: Payer: Self-pay | Admitting: Nurse Practitioner

## 2021-11-30 ENCOUNTER — Other Ambulatory Visit (HOSPITAL_COMMUNITY): Payer: Self-pay | Admitting: Nurse Practitioner

## 2021-11-30 DIAGNOSIS — D376 Neoplasm of uncertain behavior of liver, gallbladder and bile ducts: Secondary | ICD-10-CM

## 2021-11-30 LAB — AFP TUMOR MARKER: AFP, Serum, Tumor Marker: 1.9 ng/mL (ref 0.0–6.9)

## 2021-12-08 ENCOUNTER — Telehealth: Payer: Self-pay | Admitting: Internal Medicine

## 2021-12-08 NOTE — Telephone Encounter (Signed)
Done by dawn on 11/29/21

## 2021-12-08 NOTE — Telephone Encounter (Signed)
Recall for ultrasound 

## 2022-01-02 ENCOUNTER — Ambulatory Visit (HOSPITAL_COMMUNITY)
Admission: RE | Admit: 2022-01-02 | Discharge: 2022-01-02 | Disposition: A | Discharge status: Home / Self Care | Payer: Medicaid Other | Source: Ambulatory Visit | Attending: Nurse Practitioner | Admitting: Nurse Practitioner

## 2022-01-02 DIAGNOSIS — D376 Neoplasm of uncertain behavior of liver, gallbladder and bile ducts: Secondary | ICD-10-CM

## 2022-01-02 MED ORDER — IOHEXOL 300 MG/ML  SOLN
100.0000 mL | Freq: Once | INTRAMUSCULAR | Status: AC | PRN
  Administered 2022-01-02: 100 mL via INTRAVENOUS

## 2022-01-06 ENCOUNTER — Inpatient Hospital Stay: Payer: Medicaid Other | Attending: Hematology

## 2022-01-06 DIAGNOSIS — N182 Chronic kidney disease, stage 2 (mild): Secondary | ICD-10-CM | POA: Insufficient documentation

## 2022-01-06 DIAGNOSIS — D509 Iron deficiency anemia, unspecified: Secondary | ICD-10-CM | POA: Insufficient documentation

## 2022-01-12 NOTE — Progress Notes (Signed)
[address], [place] [address]   CLINIC:  Medical Oncology/Hematology  PCP:  Sofie Rower, PA-C [address] [place] [place] [address] [place] [address] [phone]   REASON FOR VISIT:  Follow-up for iron deficiency anemia, thrombocytopenia (cirrhosis), and portal vein thrombosis  CURRENT THERAPY: Intermittent IV iron, Eliquis  INTERVAL HISTORY:  [name] 46 y.o. male returns for routine follow-up of his iron deficiency anemia, thrombocytopenia, and portal vein thrombosis.  He was last seen by Tarri Abernethy PA-C on 09/16/2021.  At today's visit, he reports feeling poorly, due to ongoing issues with his gallbladder including symptoms of nausea, diarrhea, and abdominal pain.    He continues to experience severe fatigue, although he had some improved energy for about a month and a half after his IV iron (Venofer 300 mg x 3 in April/May 2023).  His Eliquis was stopped by his liver doctor on 01/04/2022, which he was previously taking for portal vein thrombosis.    Regarding his thrombocytopenia and anemia, he denies any bright red blood per rectum, but does admit to blackish watery diarrhea intermittently.  He admits to easy bruising but denies petechial rash.  He denies any B symptoms such as fever, chills, night sweats, unintentional weight loss. He reports increased fatigue with energy about 40%.  He reports headaches, dizziness, and fatigue.  No pica, restless legs, chest pain, dyspnea on exertion, lightheadedness, or syncope.  He has 40% energy and 100% appetite. He endorses that he is maintaining a stable weight.   REVIEW OF SYSTEMS:    Review of Systems  Constitutional:  Positive for fatigue. Negative for appetite change, chills, diaphoresis, fever and unexpected weight change.  HENT:   Negative for lump/mass and nosebleeds.   Eyes:  Negative for eye problems.  Respiratory:  Positive for cough and shortness of breath (from abdominal pain and  distension). Negative for hemoptysis.   Cardiovascular:  Positive for chest pain (radiation of RUQ abdominal pain to chest). Negative for leg swelling and palpitations.  Gastrointestinal:  Positive for abdominal distention, abdominal pain, diarrhea and nausea. Negative for blood in stool, constipation and vomiting.  Genitourinary:  Negative for hematuria.   Skin: Negative.   Neurological:  Positive for dizziness, headaches and numbness. Negative for light-headedness.  Hematological:  Does not bruise/bleed easily.  Psychiatric/Behavioral:  Positive for sleep disturbance.       PAST MEDICAL/SURGICAL HISTORY:  Past Medical History:  Diagnosis Date   CKD (chronic kidney disease) stage 2, GFR 60-89 ml/min    H/O colonoscopy    H/O endoscopy    Hypertension    Liver cirrhosis (HCC)    Neuropathy    OSA (obstructive sleep apnea)    S/P abdominal paracentesis    Past Surgical History:  Procedure Laterality Date   BIOPSY  09/12/2019   Procedure: BIOPSY;  Surgeon: Daneil Dolin, MD;  [facility];  Service: Endoscopy;;   COLONOSCOPY N/A 09/18/2019   Dr. Oneida Alar: 12 colon polyps removed, multiple simple adenomas and some inflammatory polyps, diverticulosis, external and internal hemorrhoids.  Next colonoscopy in 3 years.   ESOPHAGEAL BANDING N/A 09/12/2019   Procedure: ESOPHAGEAL BANDING;  Surgeon: Daneil Dolin, MD;  [facility]: [facility];  Service: Endoscopy;  Laterality: N/A;   ESOPHAGOGASTRODUODENOSCOPY N/A 09/12/2019   Dr. Gala Romney: Mild erosive reflux esophagitis.  Schatzki ring status post dilation.  Small grade 1/grade 2 esophageal varices, portal hypertensive gastropathy, hyperplastic gastric polyp, gastric erosions with chronic inactive gastritis on  biopsies, no H. pylori.   ESOPHAGOGASTRODUODENOSCOPY (EGD) WITH PROPOFOL N/A 12/16/2020   two short columns of no more than Grade 2 esophageal varices, appearing innocent. Grade 1 varices not apparent today. Multiple large  posterior body and antral hyperplastic, hemorrhagic appearing polyps, larges approximately 2.5 cm pedunculated. Portal gastropathy. Normal duodenum. Polypectomy with clips placed. EGD in 18 months.   POLYPECTOMY  09/18/2019   Procedure: POLYPECTOMY;  Surgeon: Danie Binder, MD;  [facility];  Service: Endoscopy;;   POLYPECTOMY  12/16/2020   Procedure: POLYPECTOMY;  Surgeon: Daneil Dolin, MD;  [facility];  Service: Endoscopy;;  gastric     SOCIAL HISTORY:  Social History   Socioeconomic History   Marital status: Single    Spouse name: Not on file   Number of children: Not on file   Years of education: Not on file   Highest education level: Not on file  Occupational History   Occupation: umemployed  Tobacco Use   Smoking status: Never   Smokeless tobacco: Never  Vaping Use   Vaping Use: Never used  Substance and Sexual Activity   Alcohol use: Never   Drug use: Never   Sexual activity: Not Currently  Other Topics Concern   Not on file  Social History Narrative   Not on file   Social Determinants of Health   Financial Resource Strain: Not on file  Food Insecurity: Not on file  Transportation Needs: Not on file  Physical Activity: Not on file  Stress: Not on file  Social Connections: Not on file  Intimate Partner Violence: Not on file    FAMILY HISTORY:  Family History  Problem Relation Age of Onset   Brain cancer Mother    Diabetes Mother    Diabetes Father    Pulmonary embolism Brother    Liver disease Neg Hx     CURRENT MEDICATIONS:  Outpatient Encounter Medications as of 01/13/2022  Medication Sig Note   apixaban (ELIQUIS) 5 MG TABS tablet Take 1 tablet (5 mg total) by mouth 2 (two) times daily.    Cholecalciferol (VITAMIN D3) 125 MCG (5000 UT) TABS Take 5,000 Units by mouth daily.    ciprofloxacin (CIPRO) 500 MG tablet TAKE 1 TABLET BY MOUTH DAILY    cyclobenzaprine (FLEXERIL) 10 MG tablet Take 10 mg by mouth 3 (three) times  daily as needed. 10/04/2021: Out of refills   cyclobenzaprine (FLEXERIL) 5 MG tablet Take 5 mg by mouth 2 (two) times daily as needed for muscle spasms. 10/04/2021: Out of refills   Ferrous Sulfate (IRON) 325 (65 Fe) MG TABS Take 1 tablet (325 mg total) by mouth daily.    furosemide (LASIX) 20 MG tablet Take 1 tablet (20 mg total) by mouth 2 (two) times daily.    gabapentin (NEURONTIN) 300 MG capsule Take 300 mg by mouth as needed (for pain).    lactulose (CONSTULOSE) 10 GM/15ML solution Take 45 mLs (30 g total) by mouth 3 (three) times daily.    levOCARNitine (CARNITOR) 330 MG tablet Take 330 mg by mouth 3 (three) times daily.    Multiple Vitamin (MULTIVITAMIN) tablet Take 1 tablet by mouth daily.    omeprazole (PRILOSEC) 20 MG capsule Take 1 capsule (20 mg total) by mouth daily. 10/04/2021: Expired but take   ondansetron (ZOFRAN) 4 MG tablet Take 1 tablet (4 mg total) by mouth every 6 (six) hours as needed for nausea or vomiting.    pantoprazole (PROTONIX) 40 MG tablet Take 1 tablet (40  mg total) by mouth daily. 30 minutes before breakfast. Take instead of omeprazole    spironolactone (ALDACTONE) 100 MG tablet Take 100 mg by mouth 2 (two) times daily.    XIFAXAN 550 MG TABS tablet TAKE 1 TABLET BY MOUTH TWICE DAILY    No facility-administered encounter medications on file as of 01/13/2022.    ALLERGIES:  Allergies  Allergen Reactions   Chlorthalidone     Severe headaches    Metoprolol     Severe headaches      PHYSICAL EXAM:    ECOG PERFORMANCE STATUS: 1 - Symptomatic but completely ambulatory  There were no vitals filed for this visit. There were no vitals filed for this visit. Physical Exam Constitutional:      Appearance: Normal appearance. He is obese.  HENT:     Head: Normocephalic and atraumatic.     Mouth/Throat:     Mouth: Mucous membranes are moist.  Eyes:     Extraocular Movements: Extraocular movements intact.     Pupils: Pupils are equal, round, and reactive to light.   Cardiovascular:     Rate and Rhythm: Normal rate and regular rhythm.     Pulses: Normal pulses.     Heart sounds: Normal heart sounds.  Pulmonary:     Effort: Pulmonary effort is normal.     Breath sounds: Normal breath sounds.  Abdominal:     General: Bowel sounds are normal. There is distension (moderately distended).     Palpations: Abdomen is soft.     Tenderness: There is no abdominal tenderness.  Musculoskeletal:        General: No swelling.     Right lower leg: No edema.     Left lower leg: No edema.  Lymphadenopathy:     Cervical: No cervical adenopathy.  Skin:    General: Skin is warm and dry.  Neurological:     General: No focal deficit present.     Mental Status: He is alert and oriented to person, place, and time.  Psychiatric:        Mood and Affect: Mood normal.        Behavior: Behavior normal.     LABORATORY DATA:  I have reviewed the labs as listed.  CBC    Component Value Date/Time   WBC 7.4 11/29/2021 1114   RBC 4.01 (L) 11/29/2021 1114   HGB 13.0 11/29/2021 1114   HCT 37.3 (L) 11/29/2021 1114   PLT 102 (L) 11/29/2021 1114   MCV 93.0 11/29/2021 1114   MCH 32.4 11/29/2021 1114   MCHC 34.9 11/29/2021 1114   RDW 13.6 11/29/2021 1114   LYMPHSABS 0.3 (L) 11/29/2021 1114   MONOABS 0.7 11/29/2021 1114   EOSABS 0.2 11/29/2021 1114   BASOSABS 0.0 11/29/2021 1114      Latest Ref Rng & Units 11/29/2021   11:14 AM 10/04/2021   12:18 PM 08/08/2021    1:02 PM  CMP  Glucose 70 - 99 mg/dL 99  126  104   BUN 6 - 20 mg/dL 17  22  18    Creatinine 0.61 - 1.24 mg/dL 1.36  1.26  1.25   Sodium 135 - 145 mmol/L 124  126  127   Potassium 3.5 - 5.1 mmol/L 4.6  5.0  5.0   Chloride 98 - 111 mmol/L 93  95  93   CO2 22 - 32 mmol/L 22  23  24    Calcium 8.9 - 10.3 mg/dL 9.0  9.2  9.6   Total  Protein 6.5 - 8.1 g/dL 7.5  8.1    Total Bilirubin 0.3 - 1.2 mg/dL 1.1  1.0    Alkaline Phos 38 - 126 U/L 88  89    AST 15 - 41 U/L 37  37    ALT 0 - 44 U/L 37  36       DIAGNOSTIC IMAGING:  I have independently reviewed the relevant imaging and discussed with the patient.  ASSESSMENT & PLAN: 1.  Iron deficiency anemia - He previously took iron pills which caused constipation. - Most recent IV Venofer 300 mg x 3 from 09/20/2021 through 10/06/2021 - EGD (12/16/2020): Two short columns of grade 2 esophageal varices, portal gastropathy, multiple large hemorrhagic appearing hyperplastic polyps - He is due for his next EGD in January 2024 - No bright red blood per rectum.  Intermittent blackish watery diarrhea. - Reports increased fatigue - Most recent labs (01/13/2022): Hgb 12.5/MCV 92.6, ferritin 48, iron saturation 22% - PLAN: Recommend IV Venofer 300 mg x 3 - Repeat labs and RTC in 4 months     2.  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 - SPEP was negative.  B12, methylmalonic acid, folic acid were normal.  Hepatitis B  was negative.  H. pylori was negative. - Abdominal ultrasound (06/21/2021): Liver cirrhosis and splenomegaly (estimated splenic volume 1100 mL) - Abdominal ultrasound (11/29/2021): Interval increase in splenomegaly measuring 20.6 cm in length with estimated volume 1943 cc -Patient denies any major bleeding, bruising, petechial rash - Thrombocytopenia likely secondary to cirrhosis and splenomegaly   - Most recent labs (01/13/2022): Platelets 80, ALC 0.3 - PLAN: No indication for treatment at this time.  We will continue to monitor.  We will consider sending for splenic artery embolization if platelets drop below 30.    3.  Portal vein thrombosis  - Ultrasound Doppler of the liver on 09/11/2019 showed nonocclusive hyperechoic thrombus along the main portal vein wall posteriorly extending into the right portal vein. - Eliquis was stopped by liver doctor as of 01/04/2022  4.  Liver cirrhosis and ascites - [facility] from [facility] on 08/17/2019 showed  liver morphology consistent with cirrhosis with no masses.  Enlarged spleen measuring 22 cm.  Nonocclusive thrombus in the portal vein.  Prominent to mildly enlarged periceliac and gastrohepatic ligament lymph nodes.  3.5 cm left adrenal mass consistent with adenoma. - Abdominal ultrasound (06/21/2021): Liver cirrhosis and splenomegaly (estimated splenic volume 1100 mL) - EGD (12/16/2020): Two short columns of grade 2 esophageal varices, portal gastropathy, multiple large hemorrhagic appearing hyperplastic polyps - He follows with gastroenterology and hepatology - He has significant pain from biliary dyskinesia, considered high risk surgical candidate but patient is pursuing cholecystectomy via surgical specialist in [place] questions were answered. The patient knows to call the clinic with any problems, questions or concerns.  Medical decision making: Moderate    Time spent on visit: I spent 20 minutes counseling the patient face to face. The total time spent in the appointment was 30 minutes and more than 50% was on counseling.   Harriett Rush, PA-C  01/13/2022 9:59 AM

## 2022-01-13 ENCOUNTER — Inpatient Hospital Stay: Payer: Medicaid Other | Admitting: Physician Assistant

## 2022-01-13 ENCOUNTER — Inpatient Hospital Stay: Payer: Medicaid Other

## 2022-01-13 VITALS — BP 124/78 | HR 85 | Resp 16 | Ht 68.0 in | Wt 274.3 lb

## 2022-01-13 DIAGNOSIS — D508 Other iron deficiency anemias: Secondary | ICD-10-CM

## 2022-01-13 DIAGNOSIS — D696 Thrombocytopenia, unspecified: Secondary | ICD-10-CM

## 2022-01-13 DIAGNOSIS — N182 Chronic kidney disease, stage 2 (mild): Secondary | ICD-10-CM | POA: Diagnosis not present

## 2022-01-13 DIAGNOSIS — E875 Hyperkalemia: Secondary | ICD-10-CM

## 2022-01-13 DIAGNOSIS — D509 Iron deficiency anemia, unspecified: Secondary | ICD-10-CM | POA: Diagnosis present

## 2022-01-13 LAB — COMPREHENSIVE METABOLIC PANEL
ALT: 47 U/L — ABNORMAL HIGH (ref 0–44)
AST: 45 U/L — ABNORMAL HIGH (ref 15–41)
Albumin: 3.6 g/dL (ref 3.5–5.0)
Alkaline Phosphatase: 110 U/L (ref 38–126)
Anion gap: 7 (ref 5–15)
BUN: 19 mg/dL (ref 6–20)
CO2: 23 mmol/L (ref 22–32)
Calcium: 8.8 mg/dL — ABNORMAL LOW (ref 8.9–10.3)
Chloride: 94 mmol/L — ABNORMAL LOW (ref 98–111)
Creatinine, Ser: 1.46 mg/dL — ABNORMAL HIGH (ref 0.61–1.24)
GFR, Estimated: 59 mL/min — ABNORMAL LOW (ref >60)
Glucose, Bld: 130 mg/dL — ABNORMAL HIGH (ref 70–99)
Potassium: 4.8 mmol/L (ref 3.5–5.1)
Sodium: 124 mmol/L — ABNORMAL LOW (ref 135–145)
Total Bilirubin: 1.1 mg/dL (ref 0.3–1.2)
Total Protein: 7.5 g/dL (ref 6.5–8.1)

## 2022-01-13 LAB — CBC WITH DIFFERENTIAL/PLATELET
Abs Immature Granulocytes: 0.04 10*3/uL (ref 0.00–0.07)
Basophils Absolute: 0 10*3/uL (ref 0.0–0.1)
Basophils Relative: 0 %
Eosinophils Absolute: 0.1 10*3/uL (ref 0.0–0.5)
Eosinophils Relative: 2 %
HCT: 36.5 % — ABNORMAL LOW (ref 39.0–52.0)
Hemoglobin: 12.5 g/dL — ABNORMAL LOW (ref 13.0–17.0)
Immature Granulocytes: 1 %
Lymphocytes Relative: 5 %
Lymphs Abs: 0.3 10*3/uL — ABNORMAL LOW (ref 0.7–4.0)
MCH: 31.7 pg (ref 26.0–34.0)
MCHC: 34.2 g/dL (ref 30.0–36.0)
MCV: 92.6 fL (ref 80.0–100.0)
Monocytes Absolute: 0.6 10*3/uL (ref 0.1–1.0)
Monocytes Relative: 10 %
Neutro Abs: 5 10*3/uL (ref 1.7–7.7)
Neutrophils Relative %: 82 %
Platelets: 80 10*3/uL — ABNORMAL LOW (ref 150–400)
RBC: 3.94 MIL/uL — ABNORMAL LOW (ref 4.22–5.81)
RDW: 13.9 % (ref 11.5–15.5)
WBC: 6.1 10*3/uL (ref 4.0–10.5)
nRBC: 0 % (ref 0.0–0.2)

## 2022-01-13 LAB — LACTATE DEHYDROGENASE: LDH: 115 U/L (ref 98–192)

## 2022-01-13 LAB — IRON AND TIBC
Iron: 81 ug/dL (ref 45–182)
Saturation Ratios: 22 % (ref 17.9–39.5)
TIBC: 369 ug/dL (ref 250–450)
UIBC: 288 ug/dL

## 2022-01-13 LAB — FERRITIN: Ferritin: 48 ng/mL (ref 24–336)

## 2022-01-13 NOTE — Patient Instructions (Signed)
[place] at [facility] Discharge Instructions  You were seen today by Tarri Abernethy PA-C for your iron deficiency anemia, low platelets, and portal vein thrombosis (blood clot in your liver).  IRON DEFICIENCY ANEMIA: Your iron levels are low, which may be part of why you feel so tired.  We will schedule you for IV iron infusions x3 doses.  LOW PLATELETS: You have low platelets because of your liver cirrhosis.  They are mildly low, but stable at your usual baseline.  You do not need treatment for that at this time.  PORTAL VEIN THROMBOSIS (blood clot in your liver): Eliquis was discontinued by your liver doctor.  LABS: Return in 4 months for repeat labs  FOLLOW-UP APPOINTMENT: Office visit after labs   Thank you for choosing [place] at [facility] to provide your oncology and hematology care.  To afford each patient quality time with our provider, please arrive at least 15 minutes before your scheduled appointment time.   If you have a lab appointment with the [facility] please come in thru the Main Entrance and check in at the main information desk.  You need to re-schedule your appointment should you arrive 10 or more minutes late.  We strive to give you quality time with our providers, and arriving late affects you and other patients whose appointments are after yours.  Also, if you no show three or more times for appointments you may be dismissed from the clinic at the providers discretion.     Again, thank you for choosing [facility].  Our hope is that these requests will decrease the amount of time that you wait before being seen by our physicians.       _____________________________________________________________  Should you have questions after your visit to [facility], please contact our office at [phone] and follow the prompts.  Our office hours are 8:00 a.m. and 4:30 p.m. Monday - Friday.   Please note that voicemails left after 4:00 p.m. may not be returned until the following business day.  We are closed weekends and major holidays.  You do have access to a nurse 24-7, just call the main number to the clinic [phone] and do not press any options, hold on the line and a nurse will answer the phone.    For prescription refill requests, have your pharmacy contact our office and allow 72 hours.    Due to Covid, you will need to wear a mask upon entering the hospital. If you do not have a mask, a mask will be given to you at the Main Entrance upon arrival. For doctor visits, patients may have 1 support person age 25 or older with them. For treatment visits, patients can not have anyone with them due to social distancing guidelines and our immunocompromised population.

## 2022-01-15 ENCOUNTER — Other Ambulatory Visit: Payer: Self-pay | Admitting: Gastroenterology

## 2022-01-19 ENCOUNTER — Inpatient Hospital Stay: Payer: Medicaid Other

## 2022-01-19 VITALS — BP 102/63 | HR 77 | Temp 97.3°F | Resp 20

## 2022-01-19 DIAGNOSIS — D509 Iron deficiency anemia, unspecified: Secondary | ICD-10-CM | POA: Diagnosis not present

## 2022-01-19 DIAGNOSIS — D500 Iron deficiency anemia secondary to blood loss (chronic): Secondary | ICD-10-CM

## 2022-01-19 MED ORDER — SODIUM CHLORIDE 0.9 % IV SOLN
300.0000 mg | Freq: Once | INTRAVENOUS | Status: AC
  Administered 2022-01-19: 300 mg via INTRAVENOUS
  Filled 2022-01-19: qty 300

## 2022-01-19 MED ORDER — SODIUM CHLORIDE 0.9 % IV SOLN: Freq: Once | INTRAVENOUS | Status: AC

## 2022-01-19 MED ORDER — LORATADINE 10 MG PO TABS
10.0000 mg | ORAL_TABLET | Freq: Once | ORAL | Status: AC
  Administered 2022-01-19: 10 mg via ORAL
  Filled 2022-01-19: qty 1

## 2022-01-19 NOTE — Patient Instructions (Signed)
[place]  Discharge Instructions: Thank you for choosing [facility] to provide your oncology and hematology care.  If you have a lab appointment with the [facility], please come in thru the Main Entrance and check in at the main information desk.  Wear comfortable clothing and clothing appropriate for easy access to any Portacath or PICC line.   We strive to give you quality time with your provider. You may need to reschedule your appointment if you arrive late (15 or more minutes).  Arriving late affects you and other patients whose appointments are after yours.  Also, if you miss three or more appointments without notifying the office, you may be dismissed from the clinic at the provider's discretion.      For prescription refill requests, have your pharmacy contact our office and allow 72 hours for refills to be completed.    Today you received Venofer 300 mg IV iron.   BELOW ARE SYMPTOMS THAT SHOULD BE REPORTED IMMEDIATELY: *FEVER GREATER THAN 100.4 F (38 C) OR HIGHER *CHILLS OR SWEATING *NAUSEA AND VOMITING THAT IS NOT CONTROLLED WITH YOUR NAUSEA MEDICATION *UNUSUAL SHORTNESS OF BREATH *UNUSUAL BRUISING OR BLEEDING *URINARY PROBLEMS (pain or burning when urinating, or frequent urination) *BOWEL PROBLEMS (unusual diarrhea, constipation, pain near the anus) TENDERNESS IN MOUTH AND THROAT WITH OR WITHOUT PRESENCE OF ULCERS (sore throat, sores in mouth, or a toothache) UNUSUAL RASH, SWELLING OR PAIN  UNUSUAL VAGINAL DISCHARGE OR ITCHING   Items with * indicate a potential emergency and should be followed up as soon as possible or go to the Emergency Department if any problems should occur.  Please show the CHEMOTHERAPY ALERT CARD or IMMUNOTHERAPY ALERT CARD at check-in to the Emergency Department and triage nurse.  Should you have questions after your visit or need to cancel or reschedule your appointment, please contact [facility] [phone]  and follow the prompts.  Office hours are 8:00 a.m. to 4:30 p.m. Monday - Friday. Please note that voicemails left after 4:00 p.m. may not be returned until the following business day.  We are closed weekends and major holidays. You have access to a nurse at all times for urgent questions. Please call the main number to the clinic [phone] and follow the prompts.  For any non-urgent questions, you may also contact your provider using MyChart. We now offer e-Visits for anyone 49 and older to request care online for non-urgent symptoms. For details visit [email or web address].   Also download the MyChart app! Go to the app store, search "MyChart", open the app, select [facility], and log in with your MyChart username and password.  Masks are optional in the cancer centers. If you would like for your care team to wear a mask while they are taking care of you, please let them know. You may have one support person who is at least 47 years old accompany you for your appointments.

## 2022-01-19 NOTE — Progress Notes (Signed)
Pt presents today for Venofer IV iron per provider's order. Vital signs stable and pt voiced no new complaints at this time.  Peripheral IV started with good blood return noted pre and post infusion.  Venofer 300 mg given today per MD orders. Tolerated infusion without adverse affects. Vital signs stable. No complaints at this time. Discharged from clinic ambulatory in stable condition. Alert and oriented x 3. F/U with [facility] as scheduled.

## 2022-01-23 ENCOUNTER — Other Ambulatory Visit: Payer: Self-pay | Admitting: Gastroenterology

## 2022-01-25 IMAGING — DX DG CHEST 1V PORT
1 series · 1 of 1 positions shown · non-contrast
Comparison: August 25, 2019.

CLINICAL DATA: Shortness of breath.

EXAM:
PORTABLE CHEST 1 VIEW

[chest ap]
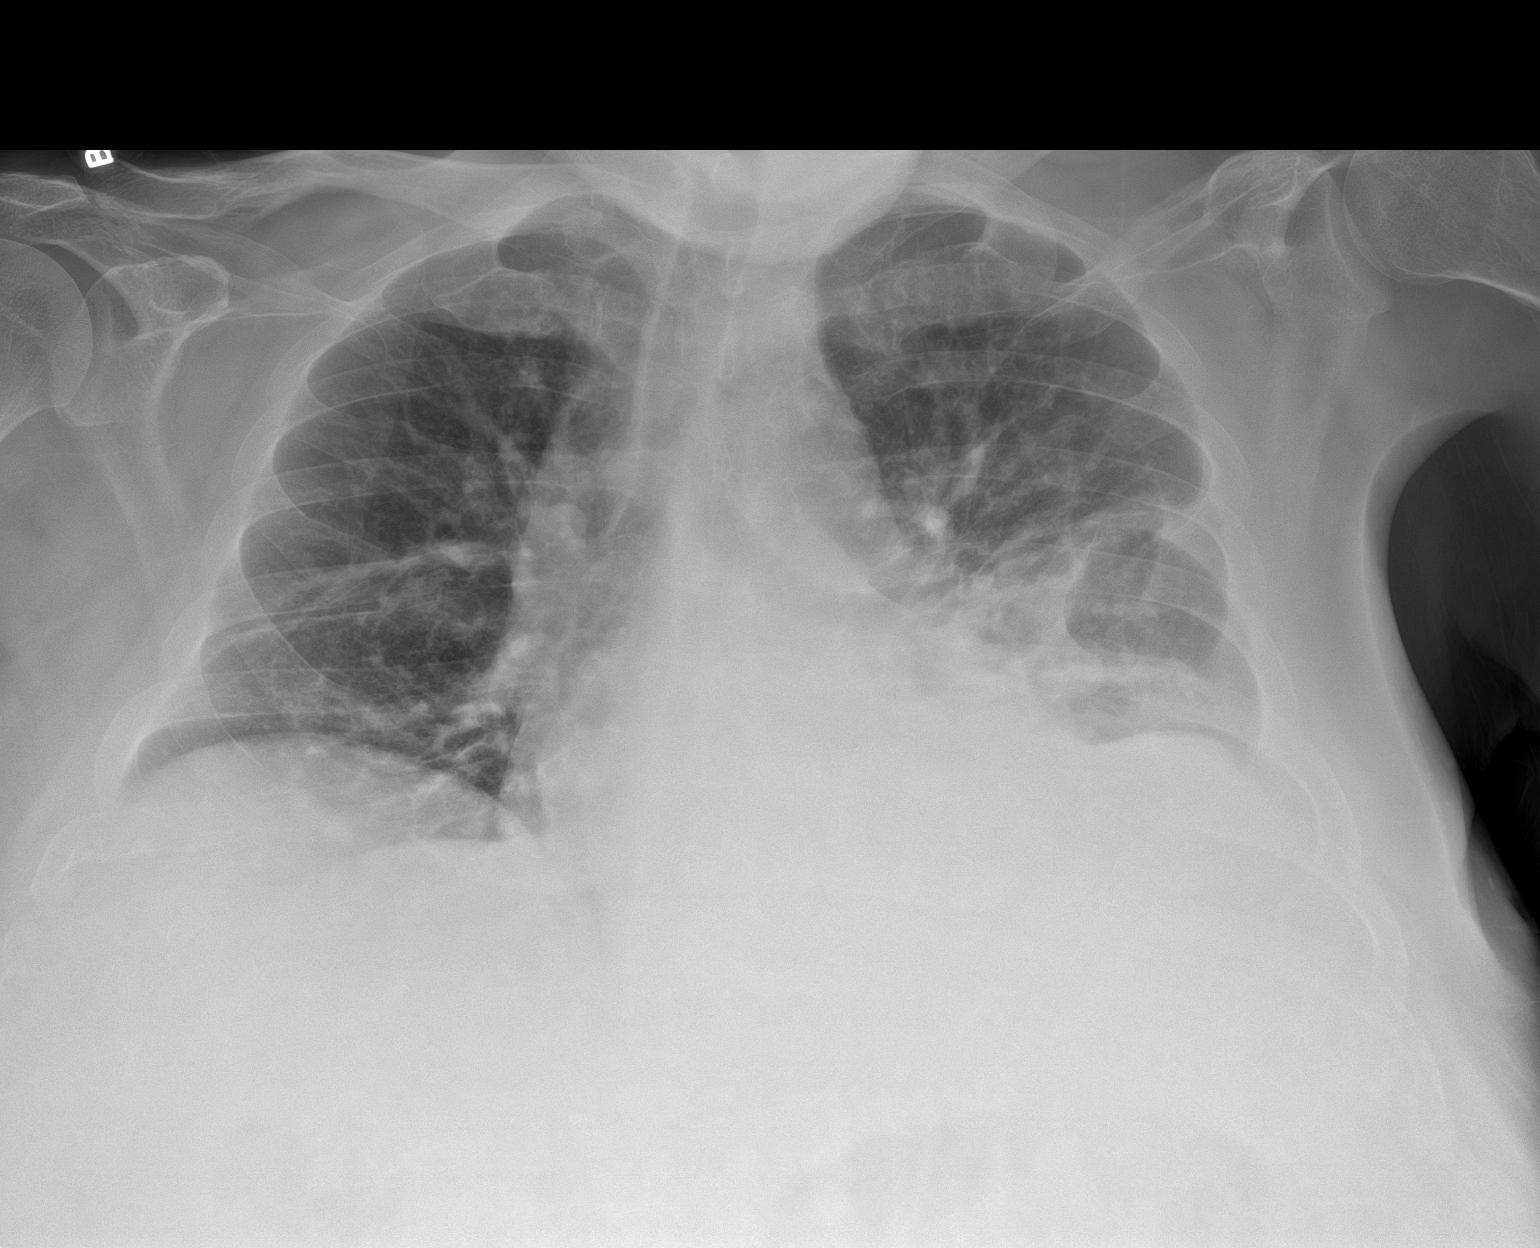

[1 of 1 positions shown; findings below may reference images not displayed]

FINDINGS: Stable cardiomediastinal silhouette. No pneumothorax is noted.
Increased left basilar atelectasis or infiltrate is noted. Minimal
right basilar subsegmental atelectasis or infiltrate is noted. Bony
thorax is unremarkable.
IMPRESSION: Increased bibasilar subsegmental atelectasis or infiltrates, left
greater than right.

## 2022-01-26 IMAGING — US US HEPATIC LIVER DOPPLER
1 series · 13 of 25 positions shown · non-contrast
Comparison: 08/17/2019 CT

CLINICAL DATA: Cirrhosis, ascites, nonocclusive portal thrombus by
CT

EXAM:
DUPLEX ULTRASOUND OF LIVER
TECHNIQUE: Color and duplex Doppler ultrasound was performed to evaluate the
hepatic in-flow and out-flow vessels.

[Series 1: us liver doppler · 13 of 70 slices shown]
[im 1/70]
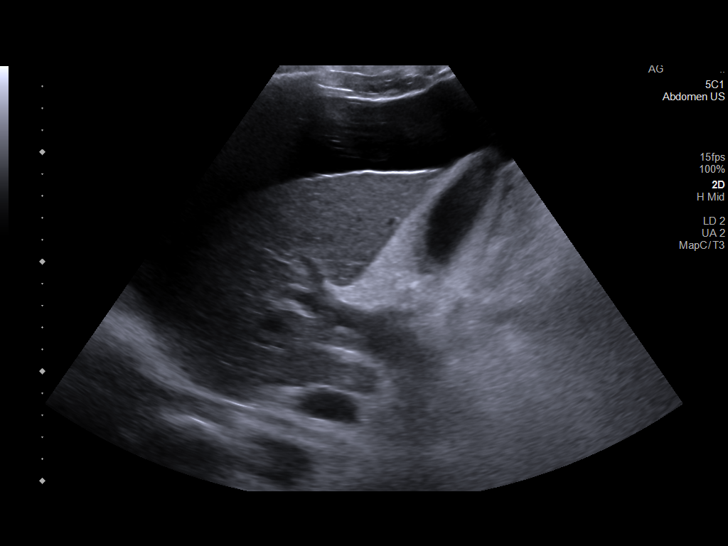
[im 6/70]
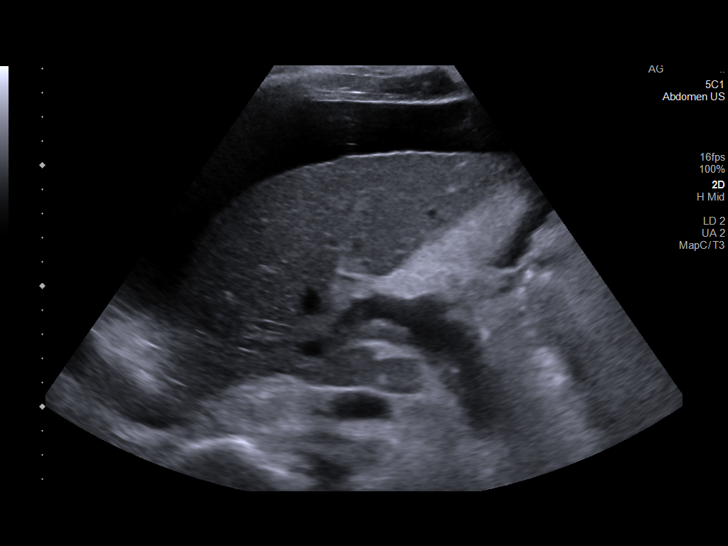
[im 12/70]
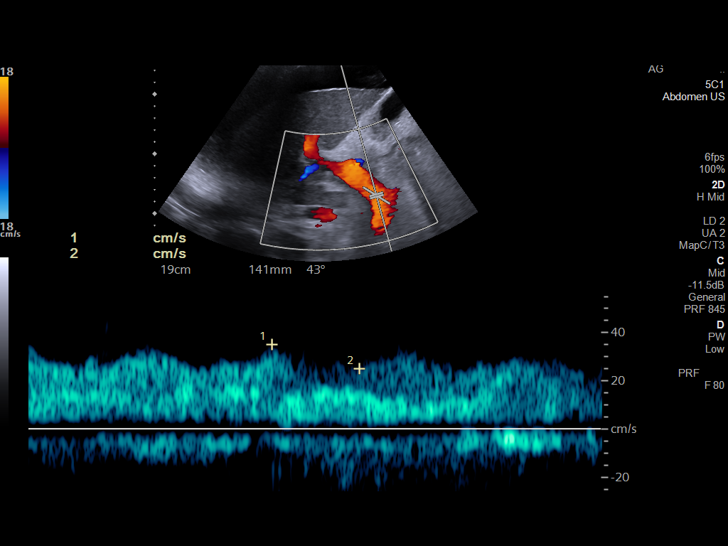
[im 18/70]
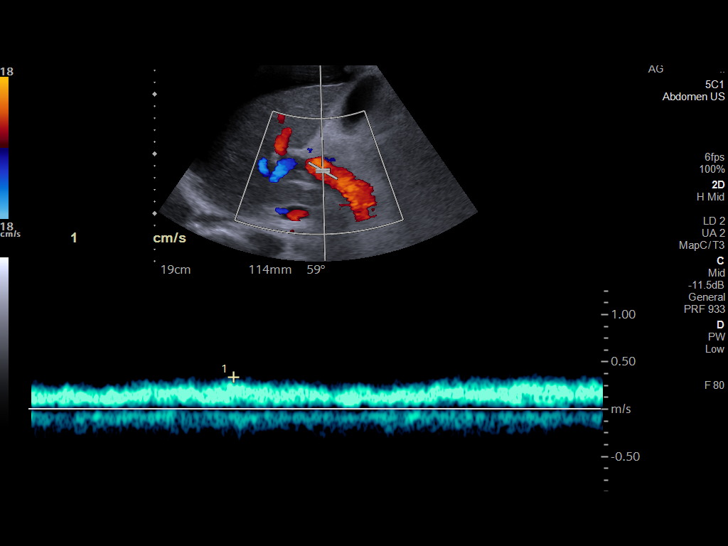
[im 24/70]
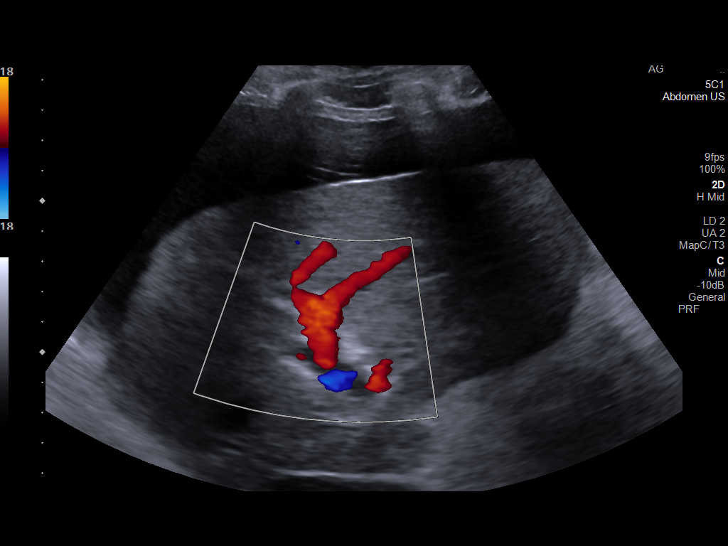
[im 29/70]
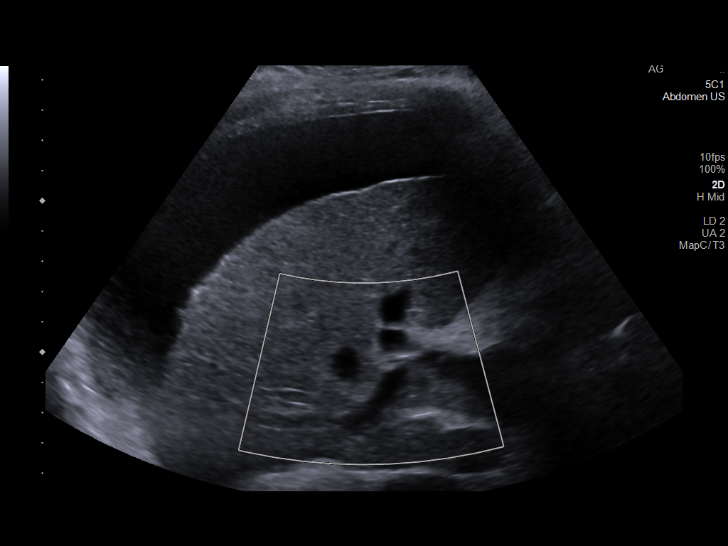
[im 35/70]
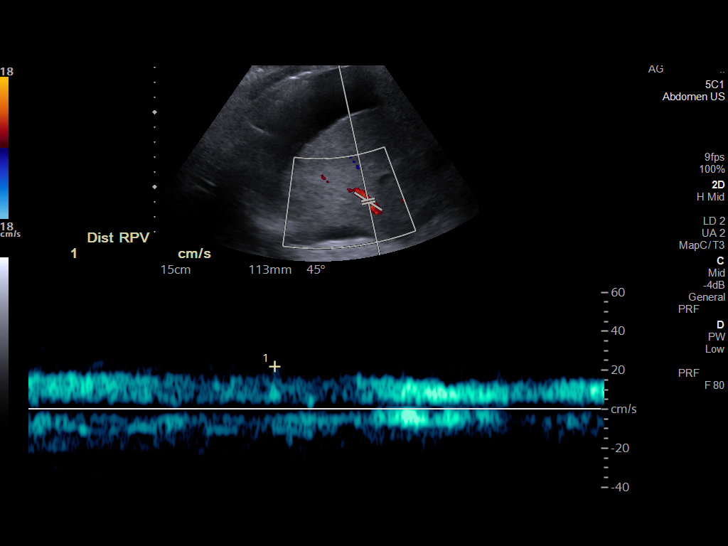
[im 41/70]
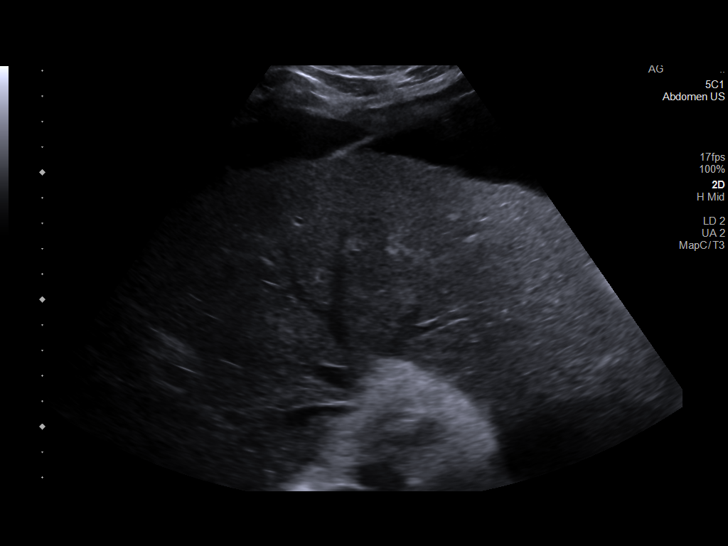
[im 47/70]
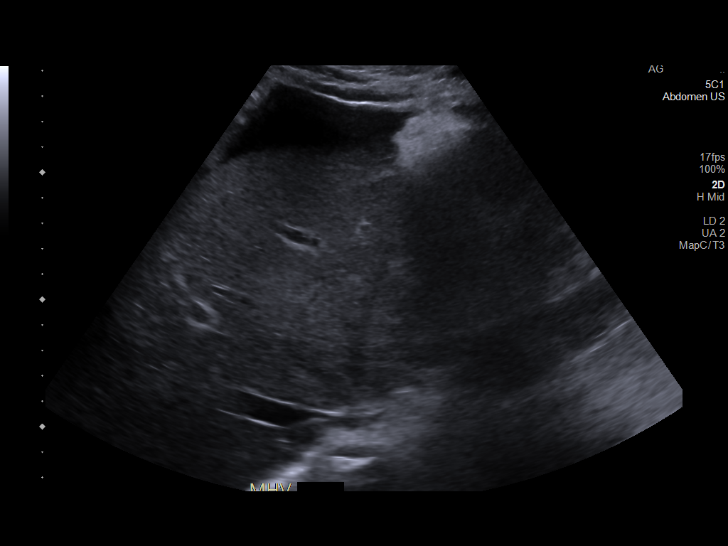
[im 52/70]
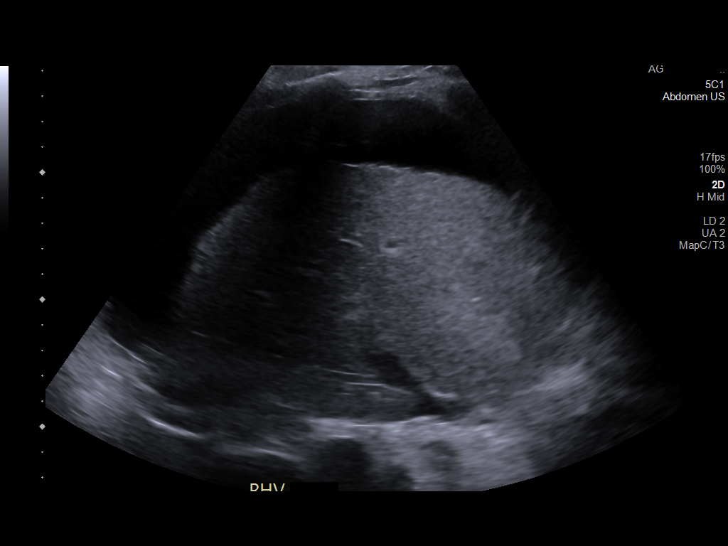
[im 58/70]
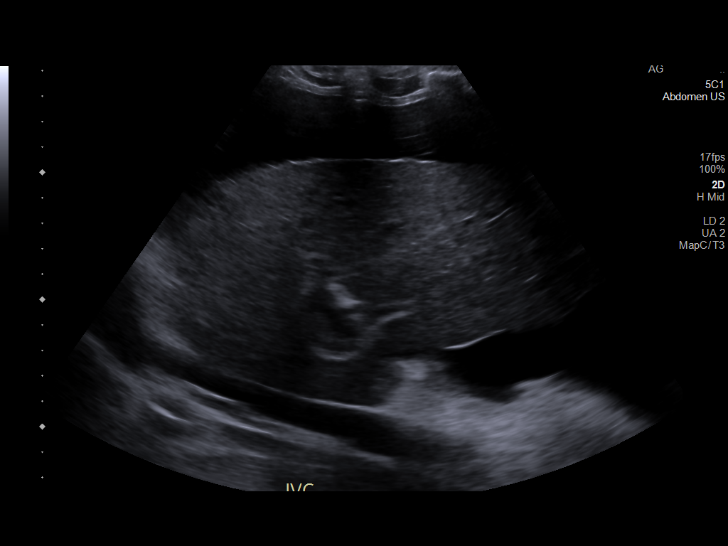
[im 64/70]
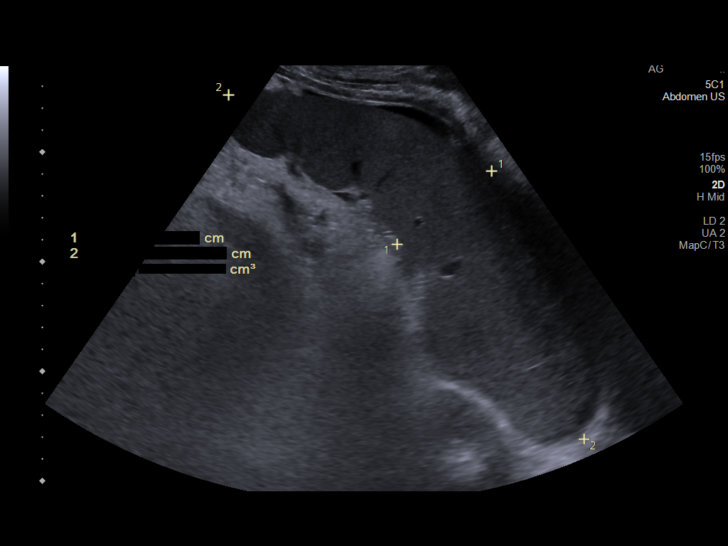
[im 70/70]
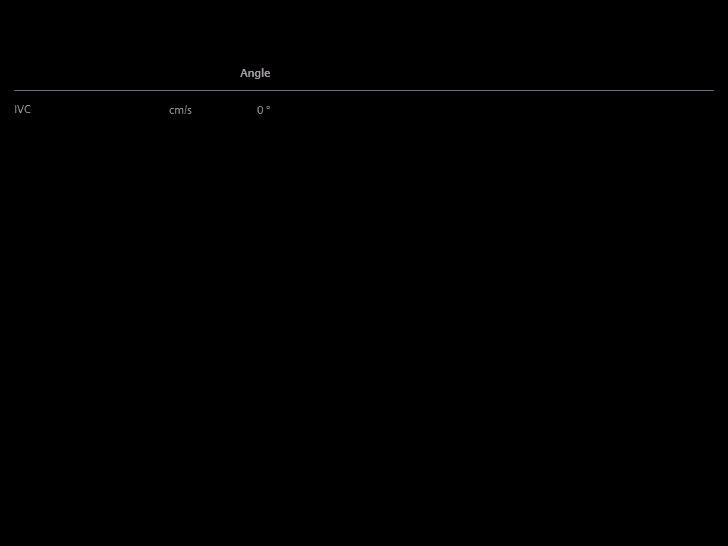

[13 of 25 positions shown; findings below may reference images not displayed]

FINDINGS: Liver: Increased echogenicity with surface nodularity compatible
with hepatic cirrhosis.

No focal lesion, mass or intrahepatic biliary ductal dilatation.

Main Portal Vein size: 2.7 cm

Portal Vein Velocities

Main Prox:  35 cm/sec

Main Mid: 39 cm/sec

Main Dist:  33 cm/sec
Right: 26 cm/sec
Left: 23 cm/sec

Hepatic Vein Velocities

Right:  40 cm/sec

Middle:  27 cm/sec

Left:  46 cm/sec

IVC: Present and patent with normal respiratory phasicity.

Hepatic Artery Velocity:  37 cm/sec

Splenic Vein Velocity:  18 cm/sec

Spleen: 18 cm x 5.5 cm x 22 cm with a total volume of 4448 cm^3
(411 cm^3 is upper limit normal)

Portal Vein Occlusion/Thrombus: Nonocclusive hypoechoic thrombus
along the main portal vein wall posteriorly extending into the right
portal vein. This correlates with the CT.

Splenic Vein Occlusion/Thrombus: No

Ascites: Moderate ascites present.

Varices: None
IMPRESSION: Nonocclusive portal vein thrombus within the main portal vein
extending into the right portal vein. Hepatopetal flow is
maintained.

Hepatic cirrhosis and ascites.  Associated splenomegaly.

## 2022-01-27 ENCOUNTER — Inpatient Hospital Stay: Payer: Medicaid Other

## 2022-01-27 VITALS — BP 99/71 | HR 91 | Temp 98.8°F | Resp 18 | Wt 277.2 lb

## 2022-01-27 DIAGNOSIS — D509 Iron deficiency anemia, unspecified: Secondary | ICD-10-CM | POA: Diagnosis not present

## 2022-01-27 DIAGNOSIS — D500 Iron deficiency anemia secondary to blood loss (chronic): Secondary | ICD-10-CM

## 2022-01-27 MED ORDER — SODIUM CHLORIDE 0.9 % IV SOLN
300.0000 mg | Freq: Once | INTRAVENOUS | Status: AC
  Administered 2022-01-27: 300 mg via INTRAVENOUS
  Filled 2022-01-27: qty 300

## 2022-01-27 MED ORDER — LORATADINE 10 MG PO TABS
10.0000 mg | ORAL_TABLET | Freq: Once | ORAL | Status: AC
  Administered 2022-01-27: 10 mg via ORAL
  Filled 2022-01-27: qty 1

## 2022-01-27 MED ORDER — SODIUM CHLORIDE 0.9 % IV SOLN: Freq: Once | INTRAVENOUS | Status: AC

## 2022-01-27 NOTE — Progress Notes (Signed)
Patient tolerated iron infusion with no complaints voiced. Peripheral IV site clean and dry with good blood return noted before and after infusion. Band aid applied. VSS with discharge and left in satisfactory condition with no s/s of distress noted.

## 2022-01-27 NOTE — Patient Instructions (Signed)
Alejandro Porter  Discharge Instructions: Thank you for choosing [facility] to provide your oncology and hematology care.  If you have a lab appointment with the [facility], please come in thru the Main Entrance and check in at the main information desk.  Wear comfortable clothing and clothing appropriate for easy access to any Portacath or PICC line.   We strive to give you quality time with your provider. You may need to reschedule your appointment if you arrive late (15 or more minutes).  Arriving late affects you and other patients whose appointments are after yours.  Also, if you miss three or more appointments without notifying the office, you may be dismissed from the clinic at the provider's discretion.      For prescription refill requests, have your pharmacy contact our office and allow 72 hours for refills to be completed.    Today you received the following Venofer, return as scheduled.   To help prevent nausea and vomiting after your treatment, we encourage you to take your nausea medication as directed.  BELOW ARE SYMPTOMS THAT SHOULD BE REPORTED IMMEDIATELY: *FEVER GREATER THAN 100.4 F (38 C) OR HIGHER *CHILLS OR SWEATING *NAUSEA AND VOMITING THAT IS NOT CONTROLLED WITH YOUR NAUSEA MEDICATION *UNUSUAL SHORTNESS OF BREATH *UNUSUAL BRUISING OR BLEEDING *URINARY PROBLEMS (pain or burning when urinating, or frequent urination) *BOWEL PROBLEMS (unusual diarrhea, constipation, pain near the anus) TENDERNESS IN MOUTH AND THROAT WITH OR WITHOUT PRESENCE OF ULCERS (sore throat, sores in mouth, or a toothache) UNUSUAL RASH, SWELLING OR PAIN  UNUSUAL VAGINAL DISCHARGE OR ITCHING   Items with * indicate a potential emergency and should be followed up as soon as possible or go to the Emergency Department if any problems should occur.  Please show the CHEMOTHERAPY ALERT CARD or IMMUNOTHERAPY ALERT CARD at check-in to the Emergency Department and triage  nurse.  Should you have questions after your visit or need to cancel or reschedule your appointment, please contact [facility] [phone]  and follow the prompts.  Office hours are 8:00 a.m. to 4:30 p.m. Monday - Friday. Please note that voicemails left after 4:00 p.m. may not be returned until the following business day.  We are closed weekends and major holidays. You have access to a nurse at all times for urgent questions. Please call the main number to the clinic [phone] and follow the prompts.  For any non-urgent questions, you may also contact your provider using MyChart. We now offer e-Visits for anyone 34 and older to request care online for non-urgent symptoms. For details visit [email or web address].   Also download the MyChart app! Go to the app store, search "MyChart", open the app, select [facility], and log in with your MyChart username and password.  Masks are optional in the cancer centers. If you would like for your care team to wear a mask while they are taking care of you, please let them know. You may have one support person who is at least 47 years old accompany you for your appointments.

## 2022-02-03 ENCOUNTER — Inpatient Hospital Stay: Payer: Medicaid Other | Attending: Hematology

## 2022-02-03 VITALS — BP 101/61 | HR 77 | Temp 97.9°F | Resp 20

## 2022-02-03 DIAGNOSIS — D509 Iron deficiency anemia, unspecified: Secondary | ICD-10-CM | POA: Diagnosis present

## 2022-02-03 DIAGNOSIS — D500 Iron deficiency anemia secondary to blood loss (chronic): Secondary | ICD-10-CM

## 2022-02-03 MED ORDER — SODIUM CHLORIDE 0.9 % IV SOLN
300.0000 mg | Freq: Once | INTRAVENOUS | Status: AC
  Administered 2022-02-03: 300 mg via INTRAVENOUS
  Filled 2022-02-03: qty 300

## 2022-02-03 MED ORDER — LORATADINE 10 MG PO TABS
10.0000 mg | ORAL_TABLET | Freq: Once | ORAL | Status: AC
  Administered 2022-02-03: 10 mg via ORAL
  Filled 2022-02-03: qty 1

## 2022-02-03 MED ORDER — SODIUM CHLORIDE 0.9 % IV SOLN: Freq: Once | INTRAVENOUS | Status: AC

## 2022-02-03 NOTE — Progress Notes (Signed)
Venofer 300 mg given today per MD orders. Tolerated infusion without adverse affects. Vital signs stable. No complaints at this time. Discharged from clinic ambulatory in stable condition. Alert and oriented x 3. F/U with [facility] as scheduled.

## 2022-02-03 NOTE — Patient Instructions (Signed)
[place]  Discharge Instructions: Thank you for choosing [facility] to provide your oncology and hematology care.  If you have a lab appointment with the [facility], please come in thru the Main Entrance and check in at the main information desk.  Wear comfortable clothing and clothing appropriate for easy access to any Portacath or PICC line.   We strive to give you quality time with your provider. You may need to reschedule your appointment if you arrive late (15 or more minutes).  Arriving late affects you and other patients whose appointments are after yours.  Also, if you miss three or more appointments without notifying the office, you may be dismissed from the clinic at the provider's discretion.      For prescription refill requests, have your pharmacy contact our office and allow 72 hours for refills to be completed.    Today you received the following chemotherapy and/or immunotherapy agents Venofer 300 mg  Iron Sucrose Injection What is this medication? IRON SUCROSE (EYE ern SOO krose) treats low levels of iron (iron deficiency anemia) in people with kidney disease. Iron is a mineral that plays an important role in making red blood cells, which carry oxygen from your lungs to the rest of your body. This medicine may be used for other purposes; ask your health care provider or pharmacist if you have questions. COMMON BRAND NAME(S): Venofer What should I tell my care team before I take this medication? They need to know if you have any of these conditions: Anemia not caused by low iron levels Heart disease High levels of iron in the blood Kidney disease Liver disease An unusual or allergic reaction to iron, other medications, foods, dyes, or preservatives Pregnant or trying to get pregnant Breast-feeding How should I use this medication? This medication is for infusion into a vein. It is given in a hospital or clinic setting. Talk to  your care team about the use of this medication in children. While this medication may be prescribed for children as young as 2 years for selected conditions, precautions do apply. Overdosage: If you think you have taken too much of this medicine contact a poison control center or emergency room at once. NOTE: This medicine is only for you. Do not share this medicine with others. What if I miss a dose? It is important not to miss your dose. Call your care team if you are unable to keep an appointment. What may interact with this medication? Do not take this medication with any of the following: Deferoxamine Dimercaprol Other iron products This medication may also interact with the following: Chloramphenicol Deferasirox This list may not describe all possible interactions. Give your health care provider a list of all the medicines, herbs, non-prescription drugs, or dietary supplements you use. Also tell them if you smoke, drink alcohol, or use illegal drugs. Some items may interact with your medicine. What should I watch for while using this medication? Visit your care team regularly. Tell your care team if your symptoms do not start to get better or if they get worse. You may need blood work done while you are taking this medication. You may need to follow a special diet. Talk to your care team. Foods that contain iron include: whole grains/cereals, dried fruits, beans, or peas, leafy green vegetables, and organ meats (liver, kidney). What side effects may I notice from receiving this medication? Side effects that you should report to your care team as  soon as possible: Allergic reactions--skin rash, itching, hives, swelling of the face, lips, tongue, or throat Low blood pressure--dizziness, feeling faint or lightheaded, blurry vision Shortness of breath Side effects that usually do not require medical attention (report to your care team if they continue or are  bothersome): Flushing Headache Joint pain Muscle pain Nausea Pain, redness, or irritation at injection site This list may not describe all possible side effects. Call your doctor for medical advice about side effects. You may report side effects to FDA at 1-800-FDA-1088. Where should I keep my medication? This medication is given in a hospital or clinic and will not be stored at home. NOTE: This sheet is a summary. It may not cover all possible information. If you have questions about this medicine, talk to your doctor, pharmacist, or health care provider.  2023 Elsevier/Gold Standard (2007-07-13 00:00:00)       To help prevent nausea and vomiting after your treatment, we encourage you to take your nausea medication as directed.  BELOW ARE SYMPTOMS THAT SHOULD BE REPORTED IMMEDIATELY: *FEVER GREATER THAN 100.4 F (38 C) OR HIGHER *CHILLS OR SWEATING *NAUSEA AND VOMITING THAT IS NOT CONTROLLED WITH YOUR NAUSEA MEDICATION *UNUSUAL SHORTNESS OF BREATH *UNUSUAL BRUISING OR BLEEDING *URINARY PROBLEMS (pain or burning when urinating, or frequent urination) *BOWEL PROBLEMS (unusual diarrhea, constipation, pain near the anus) TENDERNESS IN MOUTH AND THROAT WITH OR WITHOUT PRESENCE OF ULCERS (sore throat, sores in mouth, or a toothache) UNUSUAL RASH, SWELLING OR PAIN  UNUSUAL VAGINAL DISCHARGE OR ITCHING   Items with * indicate a potential emergency and should be followed up as soon as possible or go to the Emergency Department if any problems should occur.  Please show the CHEMOTHERAPY ALERT CARD or IMMUNOTHERAPY ALERT CARD at check-in to the Emergency Department and triage nurse.  Should you have questions after your visit or need to cancel or reschedule your appointment, please contact [facility] [phone]  and follow the prompts.  Office hours are 8:00 a.m. to 4:30 p.m. Monday - Friday. Please note that voicemails left after 4:00 p.m. may not be returned until  the following business day.  We are closed weekends and major holidays. You have access to a nurse at all times for urgent questions. Please call the main number to the clinic [phone] and follow the prompts.  For any non-urgent questions, you may also contact your provider using MyChart. We now offer e-Visits for anyone 64 and older to request care online for non-urgent symptoms. For details visit [email or web address].   Also download the MyChart app! Go to the app store, search "MyChart", open the app, select [facility], and log in with your MyChart username and password.  Masks are optional in the cancer centers. If you would like for your care team to wear a mask while they are taking care of you, please let them know. You may have one support person who is at least 47 years old accompany you for your appointments.

## 2022-02-15 ENCOUNTER — Encounter: Payer: Self-pay | Admitting: Gastroenterology

## 2022-02-15 ENCOUNTER — Ambulatory Visit (INDEPENDENT_AMBULATORY_CARE_PROVIDER_SITE_OTHER): Payer: Medicaid Other | Admitting: Gastroenterology

## 2022-02-15 ENCOUNTER — Other Ambulatory Visit: Payer: Self-pay | Admitting: *Deleted

## 2022-02-15 ENCOUNTER — Telehealth: Payer: Self-pay | Admitting: *Deleted

## 2022-02-15 VITALS — BP 120/78 | HR 92 | Temp 98.7°F | Ht 68.0 in | Wt 270.7 lb

## 2022-02-15 DIAGNOSIS — R188 Other ascites: Secondary | ICD-10-CM | POA: Diagnosis not present

## 2022-02-15 DIAGNOSIS — K7460 Unspecified cirrhosis of liver: Secondary | ICD-10-CM | POA: Diagnosis not present

## 2022-02-15 NOTE — Progress Notes (Addendum)
Gastroenterology Office Note     Primary Care Physician:  Sofie Rower, PA-C  Primary Gastroenterologist: Dr. Gala Romney    Chief Complaint   Chief Complaint  Patient presents with   Cirrhosis    Patient here today for a follow up visit on cirrhosis. Patient says he has had a significant weight gain in the last few months he reports to have gain 50- 60 lb.  He is also having periods of disorientation he reports this has happened several times as of late. Also says he has some issues with shortness of breath. Patient is taking Lactulose 45 mg TID, except for on Sundays he takes it bid. He is also on Xifaxan 550 mg bid, He is taking spironolactone 100 mg bid and Furosemide 40 mg - once or twice Qd     History of Present Illness   Alejandro Porter is a 46 y.o. male presenting today in follow-up with a history of cirrhosis complicated by portal hypertension, esophageal varices, portal vein thrombosis on Eliquis, hepatic encephalopathy on lactulose and Xfiaxan, ascites requiring repeat paras, history of SBP in April 2022, IDA followed by Hematology, and undergoing liver transplant eval at [facility].  Esophageal varices: not a candidate for non-selective blocker after history of SBP. Next EGD due in Jan 2024.   History of SBP: Cipro 500 mg daily for prophylactic purposes.    Evaluated by General Surgery due to biliary dyskinesia and felt to be high risk. Last seen at [facility] via telemedicine 01/18/22 by Gen Surgery. Recommended trial of urso to see if any improvement.    May 2023: 261, today 270 Feb 2023: 249  End of 2022 was in the 220s  Lasix 40 mg once daily, Spironlactone 100 mg BID   Urso did not help.    If elevated legs, edema improves. Standing on feet worsens. Feels like abdomen is tight.   Lactulose giving diarrhea. Some days can't go. Blood in stool if diarrhea. Takes lactulose 3 times a day. Sometimes can't remember things.    Colonsocopy due 2024.     IMPRESSION: 1. Cirrhosis, ascites, and splenomegaly. 2. No mass or suspicious contrast enhancement is clearly appreciated to correspond to findings of prior ultrasound. Slight contour deformity of the peripheral liver dome in the general vicinity of previously demonstrated subcapsular liver lesion on comparison ultrasound. This may reflect an isoattenuating hemorrhagic or proteinaceous cyst, or alternately a poorly enhancing solid lesion. Multiphasic contrast enhanced MRI may be helpful to more clearly assess for composition and contrast enhancement of this lesion. 3. Unchanged prominent portacaval and retroperitoneal lymph nodes, most likely reactive to cirrhosis. 4. Improved nodularity and bronchiolar impaction of the dependent left lung base, consistent with sequela of prior infection or aspiration. 5. Stable, benign left adrenal myelolipoma  Past Medical History:  Diagnosis Date   CKD (chronic kidney disease) stage 2, GFR 60-89 ml/min    H/O colonoscopy    H/O endoscopy    Hypertension    Liver cirrhosis (HCC)    Neuropathy    OSA (obstructive sleep apnea)    S/P abdominal paracentesis     Past Surgical History:  Procedure Laterality Date   BIOPSY  09/12/2019   Procedure: BIOPSY;  Surgeon: Daneil Dolin, MD;  [facility];  Service: Endoscopy;;   COLONOSCOPY N/A 09/18/2019   Dr. Oneida Alar: 12 colon polyps removed, multiple simple adenomas and some inflammatory polyps, diverticulosis, external and internal hemorrhoids.  Next colonoscopy in 3 years.   ESOPHAGEAL BANDING  N/A 09/12/2019   Procedure: ESOPHAGEAL BANDING;  Surgeon: Daneil Dolin, MD;  Location: [facility];  Service: Endoscopy;  Laterality: N/A;   ESOPHAGOGASTRODUODENOSCOPY N/A 09/12/2019   Dr. Gala Romney: Mild erosive reflux esophagitis.  Schatzki ring status post dilation.  Small grade 1/grade 2 esophageal varices, portal hypertensive gastropathy, hyperplastic gastric polyp, gastric erosions with  chronic inactive gastritis on biopsies, no H. pylori.   ESOPHAGOGASTRODUODENOSCOPY (EGD) WITH PROPOFOL N/A 12/16/2020   two short columns of no more than Grade 2 esophageal varices, appearing innocent. Grade 1 varices not apparent today. Multiple large posterior body and antral hyperplastic, hemorrhagic appearing polyps, larges approximately 2.5 cm pedunculated. Portal gastropathy. Normal duodenum. Polypectomy with clips placed. EGD in 18 months.   POLYPECTOMY  09/18/2019   Procedure: POLYPECTOMY;  Surgeon: Danie Binder, MD;  Location: [facility];  Service: Endoscopy;;   POLYPECTOMY  12/16/2020   Procedure: POLYPECTOMY;  Surgeon: Daneil Dolin, MD;  Location[facility];  Service: Endoscopy;;  gastric    Current Outpatient Medications  Medication Sig Dispense Refill   Cholecalciferol (VITAMIN D3) 125 MCG (5000 UT) TABS Take 5,000 Units by mouth daily.     ciprofloxacin (CIPRO) 500 MG tablet TAKE 1 TABLET BY MOUTH DAILY 30 tablet 5   cyclobenzaprine (FLEXERIL) 5 MG tablet Take 5 mg by mouth 2 (two) times daily as needed for muscle spasms.     Ferrous Sulfate (IRON) 325 (65 Fe) MG TABS Take 1 tablet (325 mg total) by mouth daily. 30 tablet 0   furosemide (LASIX) 40 MG tablet Take 40 mg by mouth 2 (two) times daily.     gabapentin (NEURONTIN) 300 MG capsule Take 300 mg by mouth as needed (for pain).     lactulose (CONSTULOSE) 10 GM/15ML solution Take 45 mLs (30 g total) by mouth 3 (three) times daily. 946 mL 2   levOCARNitine (CARNITOR) 330 MG tablet Take 330 mg by mouth 3 (three) times daily.     Multiple Vitamin (MULTIVITAMIN) tablet Take 1 tablet by mouth daily.     omeprazole (PRILOSEC) 20 MG capsule Take 1 capsule (20 mg total) by mouth daily. 30 capsule 5   ondansetron (ZOFRAN) 4 MG tablet TAKE 1 TABLET BY MOUTH EVERY 6 HOURS AS NEEDED FOR NAUSEA OR FOR VOMITING 30 tablet 3   pantoprazole (PROTONIX) 40 MG tablet Take 1 tablet (40 mg total) by mouth daily. 30 minutes before  breakfast. Take instead of omeprazole 90 tablet 3   spironolactone (ALDACTONE) 100 MG tablet Take 100 mg by mouth 2 (two) times daily.     ursodiol (ACTIGALL) 300 MG capsule Take 300 mg by mouth 2 (two) times daily.     XIFAXAN 550 MG TABS tablet TAKE 1 TABLET BY MOUTH TWICE DAILY 180 tablet 3   Zinc 100 MG TABS Take 1 tablet by mouth daily.     No current facility-administered medications for this visit.    Allergies as of 02/15/2022 - Review Complete 02/15/2022  Allergen Reaction Noted   Chlorthalidone  11/04/2019   Metoprolol  11/04/2019    Family History  Problem Relation Age of Onset   Brain cancer Mother    Diabetes Mother    Diabetes Father    Pulmonary embolism Brother    Liver disease Neg Hx     Social History   Socioeconomic History   Marital status: Single    Spouse name: Not on file   Number of children: Not on file   Years of education:  Not on file   Highest education level: Not on file  Occupational History   Occupation: umemployed  Tobacco Use   Smoking status: Never   Smokeless tobacco: Never  Vaping Use   Vaping Use: Never used  Substance and Sexual Activity   Alcohol use: Never   Drug use: Never   Sexual activity: Not Currently  Other Topics Concern   Not on file  Social History Narrative   Not on file   Social Determinants of Health   Financial Resource Strain: Not on file  Food Insecurity: Not on file  Transportation Needs: Not on file  Physical Activity: Not on file  Stress: Not on file  Social Connections: Not on file  Intimate Partner Violence: Not on file     Review of Systems   See HPI   Physical Exam   BP 120/78 (BP Location: Left Arm, Patient Position: Sitting, Cuff Size: Large)   Pulse 92   Temp 98.7 F (37.1 C) (Oral)   Ht 5' 8"  (1.727 m)   Wt 270 lb 11.2 oz (122.8 kg)   BMI 41.16 kg/m  General:   Alert and oriented. Pleasant and cooperative. Well-nourished and well-developed.  Head:  Normocephalic and  atraumatic. Eyes:  Without icterus Abdomen:  +BS, obese, distended and firm upper abdomen, less distended lower abdomen Rectal:  Deferred  Msk:  Symmetrical without gross deformities. Normal posture. Extremities:  Without edema. Neurologic:  Alert and  oriented x4;  grossly normal neurologically. Skin:  Intact without significant lesions or rashes. Psych:  Alert and cooperative. Normal mood and affect.   Assessment   Alejandro Porter is a 46 y.o. male presenting today in follow-up with a history of cirrhosis complicated by portal hypertension, esophageal varices, portal vein thrombosis on Eliquis, hepatic encephalopathy on lactulose and Xfiaxan, ascites requiring repeat paras, history of SBP in April 2022, IDA followed by Hematology, and undergoing liver transplant eval at [facility].  Cirrhosis: now with weight gain and appears to have distended abdomen. Will order para with fluid analysis. No change in diuretics at this point as managed by Nephrology. Recently with imaging via CT ordered by the Liver clinic that showed no mass or suspicious contrast enhancement corresponding to US findings but slight contour deformity of peripheral liver dome in general vicinity of previously demonstrated subcapsular liver lesion as seen on comparison ultrasound. Will discuss with Liver clinic if MRI is to be ordered or early interval CT.   Esophageal varices: not a candidate for non-selective blocker after history of SBP. Next EGD due in Jan 2024.   History of SBP: Cipro 500 mg daily for prophylactic purposes.       Colonsocopy due 2024.     PLAN     1. [place] para with fluid analysis 2. Will discuss with Roosevelt Locks, NP, if further imaging is being ordered 3. Continue Cipro for prophylaxis 4. EGD in Jan 2024 5. Colonoscopy 2024 Return in    Annitta Needs, PhD, ANP-BC [facility]   ADDENDUM: reviewed with Roosevelt Locks, NP. Will discuss MRI at upcoming visit. Will update labs  for MELD 3.0 as well.

## 2022-02-15 NOTE — Patient Instructions (Signed)
We are ordering a paracentesis. Do not take your fluid pills on the day of the paracentesis.  I will see you in 4 weeks!  I enjoyed seeing you again today! As you know, I value our relationship and want to provide genuine, compassionate, and quality care. I welcome your feedback. If you receive a survey regarding your visit,  I greatly appreciate you taking time to fill this out. See you next time!  Annitta Needs, PhD, ANP-BC [facility]

## 2022-02-15 NOTE — Telephone Encounter (Signed)
Lmovm with [place] PARA appt details for 9/15am, arrival 9:45am.

## 2022-02-16 ENCOUNTER — Ambulatory Visit: Payer: Medicaid Other | Admitting: Nutrition

## 2022-02-17 ENCOUNTER — Ambulatory Visit (HOSPITAL_COMMUNITY)
Admission: RE | Admit: 2022-02-17 | Discharge: 2022-02-17 | Disposition: A | Discharge status: Home / Self Care | Payer: Medicaid Other | Source: Ambulatory Visit | Attending: Gastroenterology | Admitting: Gastroenterology

## 2022-02-17 ENCOUNTER — Encounter (HOSPITAL_COMMUNITY): Payer: Self-pay

## 2022-02-17 DIAGNOSIS — R188 Other ascites: Secondary | ICD-10-CM

## 2022-02-17 LAB — BODY FLUID CELL COUNT WITH DIFFERENTIAL
Eos, Fluid: 1 %
Lymphs, Fluid: 31 %
Monocyte-Macrophage-Serous Fluid: 62 % (ref 50–90)
Neutrophil Count, Fluid: 6 % (ref 0–25)
Total Nucleated Cell Count, Fluid: 812 cu mm (ref 0–1000)

## 2022-02-17 LAB — GRAM STAIN: Gram Stain: NONE SEEN

## 2022-02-17 NOTE — Progress Notes (Signed)
PT tolerated left sided paracentesis procedure well today and 1.9 Liters of ascites removed with labs collected and taken to lab for processing. PT verbalized understanding of discharge instructions and ambulatory at departure with no acute distress noted.

## 2022-02-17 NOTE — Procedures (Signed)
PreOperative Dx: Cirrhosis, ascites Postoperative Dx: Cirrhosis, ascites Procedure:   US guided paracentesis Radiologist:  Thornton Papas Anesthesia:  10 ml of1% lidocaine Specimen:  1.9 L of amber ascitic fluid EBL:   < 1 ml Complications: None

## 2022-02-21 ENCOUNTER — Telehealth: Payer: Self-pay | Admitting: Gastroenterology

## 2022-02-21 DIAGNOSIS — R188 Other ascites: Secondary | ICD-10-CM

## 2022-02-21 NOTE — Telephone Encounter (Signed)
Pt needs an updated referral for appt tomorrow [facility]

## 2022-02-21 NOTE — Telephone Encounter (Signed)
Patient called and said that he has an appointment tomorrow, 9/20, at the [facility] and they are requesting an updated referral from [place].

## 2022-02-22 ENCOUNTER — Encounter: Payer: Medicaid Other | Attending: Physician Assistant | Admitting: Nutrition

## 2022-02-22 VITALS — Ht 69.0 in | Wt 269.0 lb

## 2022-02-22 DIAGNOSIS — K7581 Nonalcoholic steatohepatitis (NASH): Secondary | ICD-10-CM | POA: Insufficient documentation

## 2022-02-22 DIAGNOSIS — E440 Moderate protein-calorie malnutrition: Secondary | ICD-10-CM | POA: Insufficient documentation

## 2022-02-22 DIAGNOSIS — K7460 Unspecified cirrhosis of liver: Secondary | ICD-10-CM | POA: Diagnosis present

## 2022-02-22 DIAGNOSIS — D508 Other iron deficiency anemias: Secondary | ICD-10-CM | POA: Diagnosis present

## 2022-02-22 LAB — CULTURE, BODY FLUID W GRAM STAIN -BOTTLE: Culture: NO GROWTH

## 2022-02-22 LAB — CYTOLOGY - NON PAP

## 2022-02-22 NOTE — Telephone Encounter (Signed)
Updated referral has been put in and [place] has been informed.

## 2022-02-22 NOTE — Patient Instructions (Addendum)
Goal  Try drinking 1-2 oral supplements daily when you're not eating meal. Increase fresh fruits and vegetables. Increase protein plant based food.

## 2022-02-22 NOTE — Addendum Note (Signed)
Addended by: Madelin Rear on: 02/22/2022 10:20 AM   Modules accepted: Orders

## 2022-02-22 NOTE — Progress Notes (Signed)
Medical Nutrition Therapy:  Appt start time: 1100 end time: 1130  Assessment:  Primary concerns today: Cirrhosis of liver, NASH. Protein Calorie Malnutrition. Lives with his dad.  He wants to see a therapist. He has lost his mom and family members and is depressed about his condition. Feels helpless. Had a liter and 1/2 removed last week of fluid. He notes he feels some better but it hasn't made a big difference. Going to see heart MD the 3rd of next month.  Has cut out eating at the cafe most of the time. Still goes out to eat with his dad at times. Limited food choices. Tries to eat the better food choices. Admits he isn't perfect.  Is aware of high sodium foods to avoid.       Latest Ref Rng & Units 01/13/2022    8:03 AM 11/29/2021   11:14 AM 10/04/2021   12:18 PM  CMP  Glucose 70 - 99 mg/dL 130  99  126   BUN 6 - 20 mg/dL 19  17  22    Creatinine 0.61 - 1.24 mg/dL 1.46  1.36  1.26   Sodium 135 - 145 mmol/L 124  124  126   Potassium 3.5 - 5.1 mmol/L 4.8  4.6  5.0   Chloride 98 - 111 mmol/L 94  93  95   CO2 22 - 32 mmol/L 23  22  23    Calcium 8.9 - 10.3 mg/dL 8.8  9.0  9.2   Total Protein 6.5 - 8.1 g/dL 7.5  7.5  8.1   Total Bilirubin 0.3 - 1.2 mg/dL 1.1  1.1  1.0   Alkaline Phos 38 - 126 U/L 110  88  89   AST 15 - 41 U/L 45  37  37   ALT 0 - 44 U/L 47  37  36    Wt Readings from Last 3 Encounters:  02/15/22 270 lb 11.2 oz (122.8 kg)  01/27/22 277 lb 3.2 oz (125.7 kg)  01/13/22 274 lb 4 oz (124.4 kg)   Ht Readings from Last 3 Encounters:  02/15/22 5' 8"  (1.727 m)  01/13/22 5' 8"  (1.727 m)  11/28/21 5' 8"  (1.727 m)   There is no height or weight on file to calculate BMI. @BMIFA @ Facility age limit for growth %iles is 20 years. Facility age limit for growth %iles is 20 years.    Preferred Learning Style: Auditory       Visual -not reading. Hands on  Learning Readiness:  Ready Change in progress   MEDICATIONS:   DIETARY INTAKE:  24-hr recall: B ( AM):   Tomato and [place] sandwich, Minutemaid lemonade, grapes L) Chicken, potatoes, vegetables in a soup  Protein shake once a day. D) vegetables soup, water   Beverages: water   Usual physical activity: walk some  Estimated energy needs: 1800-2000  calories 225 g carbohydrates 150  g protein 56 g fat  Progress Towards Goal(s):  In progress.   Nutritional Diagnosis:  NB-1.1 Food and nutrition-related knowledge deficit As related to Cirrhosis NASH.  As evidenced by Ascites.    Intervention:  Nutrition Cirrhosis education provided on My Plate, CHO counting, meal planning, portion sizes, timing of meals, Low salt diet, Cirrhosis Nutrition Therapy. Need for daily weights and calling MD with fluid weight gain. Low Potassium foods.  Goal  Try drinking 1-2 oral supplements daily when you're not eating meal. Increase fresh fruits and vegetables. Increase protein plant based food.  Teaching Method Utilized:  Ship broker  Hands on  Handouts given during visit include: The Plate Method  Low Sodium Foods Cirrhosis nutrition   Barriers to learning/adherence to lifestyle change: Cirrhosis.  Demonstrated degree of understanding via:  Teach Back   Monitoring/Evaluation:  Dietary intake, exercise, , and body weight in 1 month(s).

## 2022-02-22 NOTE — Telephone Encounter (Signed)
Please send referral. History of cirrhosis.

## 2022-03-02 ENCOUNTER — Encounter: Payer: Self-pay | Admitting: Nutrition

## 2022-03-06 NOTE — Progress Notes (Signed)
Cardiology Office Note  Date: 03/07/2022   ID: Alejandro Porter, DOB Sep 23, 1975, MRN [id number]  PCP:  Sofie Rower, PA-C  Cardiologist:  Rozann Lesches, MD Electrophysiologist:  None   Chief Complaint  Patient presents with   Cardiac follow-up    History of Present Illness: Alejandro Porter is a 46 y.o. male last seen in April.  He presents for a follow-up visit.  I saw him specifically for preoperative cardiac assessment at last visit in anticipation of laparoscopic cholecystectomy.  It sounds like he was subsequently assessed by surgeons in [place], sent to [facility] for further assessment, and by his account felt to be too high risk for surgery.  He is under the impression that he needs to have a liver transplant prior to considering any other surgeries.  He has a visit with gastroenterology in about a week.  As mentioned in the prior notes he has no documented history of obstructive CAD or myocardial infarction, normal LVEF, no specific arrhythmias by cardiac monitor last year.    He has a previous history of portal vein thrombosis and was on Eliquis in the past.  Past Medical History:  Diagnosis Date   CKD (chronic kidney disease) stage 2, GFR 60-89 ml/min    H/O colonoscopy    H/O endoscopy    Hypertension    Liver cirrhosis (HCC)    Neuropathy    OSA (obstructive sleep apnea)    S/P abdominal paracentesis     Past Surgical History:  Procedure Laterality Date   BIOPSY  09/12/2019   Procedure: BIOPSY;  Surgeon: Daneil Dolin, MD;  [facility];  Service: Endoscopy;;   COLONOSCOPY N/A 09/18/2019   Dr. Oneida Alar: 12 colon polyps removed, multiple simple adenomas and some inflammatory polyps, diverticulosis, external and internal hemorrhoids.  Next colonoscopy in 3 years.   ESOPHAGEAL BANDING N/A 09/12/2019   Procedure: ESOPHAGEAL BANDING;  Surgeon: Daneil Dolin, MD;  [facility];  Service: Endoscopy;  Laterality: N/A;    ESOPHAGOGASTRODUODENOSCOPY N/A 09/12/2019   Dr. Gala Romney: Mild erosive reflux esophagitis.  Schatzki ring status post dilation.  Small grade 1/grade 2 esophageal varices, portal hypertensive gastropathy, hyperplastic gastric polyp, gastric erosions with chronic inactive gastritis on biopsies, no H. pylori.   ESOPHAGOGASTRODUODENOSCOPY (EGD) WITH PROPOFOL N/A 12/16/2020   two short columns of no more than Grade 2 esophageal varices, appearing innocent. Grade 1 varices not apparent today. Multiple large posterior body and antral hyperplastic, hemorrhagic appearing polyps, larges approximately 2.5 cm pedunculated. Portal gastropathy. Normal duodenum. Polypectomy with clips placed. EGD in 18 months.   POLYPECTOMY  09/18/2019   Procedure: POLYPECTOMY;  Surgeon: Danie Binder, MD;  [facility];  Service: Endoscopy;;   POLYPECTOMY  12/16/2020   Procedure: POLYPECTOMY;  Surgeon: Daneil Dolin, MD;  [facility];  Service: Endoscopy;;  gastric    Current Outpatient Medications  Medication Sig Dispense Refill   Cholecalciferol (VITAMIN D3) 125 MCG (5000 UT) TABS Take 5,000 Units by mouth daily.     ciprofloxacin (CIPRO) 500 MG tablet TAKE 1 TABLET BY MOUTH DAILY 30 tablet 5   cyclobenzaprine (FLEXERIL) 5 MG tablet Take 5 mg by mouth 2 (two) times daily as needed for muscle spasms.     Ferrous Sulfate (IRON) 325 (65 Fe) MG TABS Take 1 tablet (325 mg total) by mouth daily. 30 tablet 0   furosemide (LASIX) 40 MG tablet Take 40 mg by mouth 2 (two) times daily.  gabapentin (NEURONTIN) 300 MG capsule Take 300 mg by mouth as needed (for pain).     lactulose (CONSTULOSE) 10 GM/15ML solution Take 45 mLs (30 g total) by mouth 3 (three) times daily. 946 mL 2   levOCARNitine (CARNITOR) 330 MG tablet Take 330 mg by mouth 3 (three) times daily.     Multiple Vitamin (MULTIVITAMIN) tablet Take 1 tablet by mouth daily.     omeprazole (PRILOSEC) 20 MG capsule Take 1 capsule (20 mg total) by mouth  daily. 30 capsule 5   ondansetron (ZOFRAN) 4 MG tablet TAKE 1 TABLET BY MOUTH EVERY 6 HOURS AS NEEDED FOR NAUSEA OR FOR VOMITING 30 tablet 3   pantoprazole (PROTONIX) 40 MG tablet Take 1 tablet (40 mg total) by mouth daily. 30 minutes before breakfast. Take instead of omeprazole 90 tablet 3   spironolactone (ALDACTONE) 100 MG tablet Take 100 mg by mouth 2 (two) times daily.     ursodiol (ACTIGALL) 300 MG capsule Take 300 mg by mouth 2 (two) times daily.     XIFAXAN 550 MG TABS tablet TAKE 1 TABLET BY MOUTH TWICE DAILY 180 tablet 3   Zinc 100 MG TABS Take 1 tablet by mouth daily.     No current facility-administered medications for this visit.   Allergies:  Chlorthalidone and Metoprolol   ROS: No syncope.  Recurring abdominal discomfort and increasing abdominal girth.  Physical Exam: VS:  BP 120/80   Pulse 81   Ht 5' 8"  (1.727 m)   Wt 269 lb 3.2 oz (122.1 kg)   SpO2 98%   BMI 40.93 kg/m , BMI Body mass index is 40.93 kg/m.  Wt Readings from Last 3 Encounters:  03/07/22 269 lb 3.2 oz (122.1 kg)  02/22/22 269 lb (122 kg)  02/15/22 270 lb 11.2 oz (122.8 kg)    General: Patient appears comfortable at rest. HEENT: Conjunctiva and lids normal. Neck: Supple, no elevated JVP or carotid bruits. Lungs: Clear to auscultation, nonlabored breathing at rest. Cardiac: Regular rate and rhythm, no S3, 1/6 systolic murmur, no pericardial rub. Abdomen: Protuberant with ascites, nontender. Extremities: Chronic appearing lower leg edema.  ECG:  An ECG dated 09/05/2021 was personally reviewed today and demonstrated:  Sinus rhythm with increased voltage, Q waves in lead III and aVF which are old.  Recent Labwork: 07/28/2021: TSH 1.168 01/13/2022: ALT 47; AST 45; BUN 19; Creatinine, Ser 1.46; Hemoglobin 12.5; Platelets 80; Potassium 4.8; Sodium 124   Other Studies Reviewed Today:  Echocardiogram 12/31/2020:  1. Left ventricular ejection fraction, by estimation, is 55 to 60%. The  left ventricle has  normal function. The left ventricle has no regional  wall motion abnormalities. Left ventricular diastolic parameters were  normal.   2. Right ventricular systolic function is normal. The right ventricular  size is normal. There is normal pulmonary artery systolic pressure.   3. The mitral valve is normal in structure. Trivial mitral valve  regurgitation. No evidence of mitral stenosis.   4. The aortic valve is tricuspid. Aortic valve regurgitation is not  visualized. No aortic stenosis is present.   5. The inferior vena cava is normal in size with greater than 50%  respiratory variability, suggesting right atrial pressure of 3 mmHg.   Cardiac monitor August 2022: ZIO XT reviewed.  5 days, 7 hours analyzed.  Predominant rhythm is sinus with heart rate ranging from 60 bpm up to 134 bpm and average heart rate 85 bpm.  There were rare PACs including couplets representing less than 1%  total beats.  There were rare PVCs including couplets representing less than 1% total beats.  No significant arrhythmias or pauses.  Assessment and Plan:  1.  History of palpitations with no documented cardiac arrhythmia.  Symptoms quiescent at this time.  No further cardiac testing planned at this point.  2.  Hepatic cirrhosis with portal hypertension and esophageal varices, recurrent ascites and SBP, also portal vein thrombosis previously on Eliquis.  He is following with gastroenterology.  Uncertain where he is in terms of liver transplant evaluation.  He has a visit with gastroenterology in a week.  Remains on Aldactone.  3.  Gallbladder disease with previous plan for laparoscopic cholecystectomy, however apparently felt to be too high risk per surgeon evaluation.  We assessed him for preoperative cardiac evaluation in April and he does not have a specific cardiac contraindication to proceed (intermediate risk from a cardiac perspective).  No further cardiac testing is planned.  4.  OSA on CPAP.  Medication  Adjustments/Labs and Tests Ordered: Current medicines are reviewed at length with the patient today.  Concerns regarding medicines are outlined above.   Tests Ordered: No orders of the defined types were placed in this encounter.   Medication Changes: No orders of the defined types were placed in this encounter.   Disposition:  Follow up  as needed.  Signed, Satira Sark, MD, [facility] 03/07/2022 11:29 AM    [facility] [place], [place], [place] [address] Phone: [phone]; Fax: [phone]

## 2022-03-07 ENCOUNTER — Ambulatory Visit: Payer: Medicaid Other | Attending: Cardiology | Admitting: Cardiology

## 2022-03-07 ENCOUNTER — Encounter: Payer: Self-pay | Admitting: Cardiology

## 2022-03-07 VITALS — BP 120/80 | HR 81 | Ht 68.0 in | Wt 269.2 lb

## 2022-03-07 DIAGNOSIS — R002 Palpitations: Secondary | ICD-10-CM

## 2022-03-07 DIAGNOSIS — I81 Portal vein thrombosis: Secondary | ICD-10-CM

## 2022-03-07 NOTE — Patient Instructions (Signed)
Medication Instructions:  Your physician recommends that you continue on your current medications as directed. Please refer to the Current Medication list given to you today.   Labwork: None  Testing/Procedures: None  Follow-Up: Follow up with Dr. Domenic Polite as needed.   Any Other Special Instructions Will Be Listed Below (If Applicable).     If you need a refill on your cardiac medications before your next appointment, please call your pharmacy.

## 2022-03-15 ENCOUNTER — Ambulatory Visit (INDEPENDENT_AMBULATORY_CARE_PROVIDER_SITE_OTHER): Payer: Medicaid Other | Admitting: Gastroenterology

## 2022-03-15 ENCOUNTER — Encounter: Payer: Self-pay | Admitting: Gastroenterology

## 2022-03-15 ENCOUNTER — Telehealth: Payer: Self-pay | Admitting: *Deleted

## 2022-03-15 ENCOUNTER — Encounter: Payer: Self-pay | Admitting: *Deleted

## 2022-03-15 VITALS — BP 123/85 | HR 92 | Temp 98.7°F | Ht 68.0 in | Wt 269.5 lb

## 2022-03-15 DIAGNOSIS — R1013 Epigastric pain: Secondary | ICD-10-CM | POA: Diagnosis not present

## 2022-03-15 DIAGNOSIS — K7460 Unspecified cirrhosis of liver: Secondary | ICD-10-CM | POA: Diagnosis not present

## 2022-03-15 DIAGNOSIS — K625 Hemorrhage of anus and rectum: Secondary | ICD-10-CM | POA: Diagnosis not present

## 2022-03-15 MED ORDER — CLENPIQ 10-3.5-12 MG-GM -GM/175ML PO SOLN: 1.0000 | ORAL | 0 refills | Status: DC

## 2022-03-15 NOTE — Telephone Encounter (Signed)
PA approved via [employer] for mri liver. Order ID: [id number]       Approval Valid Through: 03/15/2022 - 12/09/202   Called pt and made aware of MRI appt details. He is also aware of his pre-op appointment.

## 2022-03-15 NOTE — H&P (View-Only) (Signed)
Gastroenterology Office Note     Primary Care Physician:  Sofie Rower, PA-C  Primary Gastroenterologist: Dr. Gala Romney    Chief Complaint   Chief Complaint  Patient presents with   Abdominal Pain    Upper abdominal pain after eating and drinking. Goes away after taking gabapentin. Took 6 tablets yesterday. Diarrhea with blood in stool and nausea.      History of Present Illness   Alejandro Porter is a 46 y.o. male presenting today in follow-up with a history of cirrhosis complicated by portal hypertension, esophageal varices, portal vein thrombosis now off Eliquis, hepatic encephalopathy on lactulose and Xfiaxan, ascites requiring repeat paras, history of SBP in April 2022, IDA followed by Hematology, biliary dyskinesia but not a surgical candidate, and undergoing liver transplant eval at [facility].   Will have regular BMs then diarrhea. Majority of time is diarrhea. Stomach hurting this morning. Taking gabapentin that helps with abdominal pain. Some intermittent hematochezia. Saw what looked like black tar yesterday when having diarrhea but then turned brown.   Taking lactulose 3 times a day. Having diarrhea if taking lactulose. Cramping in stomach with lactulose.  Had 3 soft BMs this morning.   Frustrated regarding chronic biliary pain. Anxious and depressed. No homicidal or suicidal ideation. No improvement with trial of Urso. CT in July 2023 with liver protocol by Roosevelt Locks, NP, showed no mass or suspicious contrast enhancement corresponding to US findings but slight contour deformity of peripheral liver dome in general vicinity of previously demonstrated subcapsular liver lesion as seen on comparison ultrasound. Discussed with Roosevelt Locks and we will order MRI. AFP normal.    History of multiple adenomas with surveillance due next year; however, he is having low-volume rectal bleeding intermittently.    Past Medical History:  Diagnosis Date   CKD (chronic kidney  disease) stage 2, GFR 60-89 ml/min    H/O colonoscopy    H/O endoscopy    Hypertension    Liver cirrhosis (HCC)    Neuropathy    OSA (obstructive sleep apnea)    S/P abdominal paracentesis     Past Surgical History:  Procedure Laterality Date   BIOPSY  09/12/2019   Procedure: BIOPSY;  Surgeon: Daneil Dolin, MD;  [facility];  Service: Endoscopy;;   COLONOSCOPY N/A 09/18/2019   Dr. Oneida Alar: 12 colon polyps removed, multiple simple adenomas and some inflammatory polyps, diverticulosis, external and internal hemorrhoids.  Next colonoscopy in 3 years.   ESOPHAGEAL BANDING N/A 09/12/2019   Procedure: ESOPHAGEAL BANDING;  Surgeon: Daneil Dolin, MD;  [facility];  Service: Endoscopy;  Laterality: N/A;   ESOPHAGOGASTRODUODENOSCOPY N/A 09/12/2019   Dr. Gala Romney: Mild erosive reflux esophagitis.  Schatzki ring status post dilation.  Small grade 1/grade 2 esophageal varices, portal hypertensive gastropathy, hyperplastic gastric polyp, gastric erosions with chronic inactive gastritis on biopsies, no H. pylori.   ESOPHAGOGASTRODUODENOSCOPY (EGD) WITH PROPOFOL N/A 12/16/2020   two short columns of no more than Grade 2 esophageal varices, appearing innocent. Grade 1 varices not apparent today. Multiple large posterior body and antral hyperplastic, hemorrhagic appearing polyps, larges approximately 2.5 cm pedunculated. Portal gastropathy. Normal duodenum. Polypectomy with clips placed. EGD in 18 months.   POLYPECTOMY  09/18/2019   Procedure: POLYPECTOMY;  Surgeon: Danie Binder, MD;  [facility];  Service: Endoscopy;;   POLYPECTOMY  12/16/2020   Procedure: POLYPECTOMY;  Surgeon: Daneil Dolin, MD;  [facility];  Service: Endoscopy;;  gastric  Current Outpatient Medications  Medication Sig Dispense Refill   Cholecalciferol (VITAMIN D3) 125 MCG (5000 UT) TABS Take 5,000 Units by mouth daily.     ciprofloxacin (CIPRO) 500 MG tablet TAKE 1 TABLET BY  MOUTH DAILY 30 tablet 5   cyclobenzaprine (FLEXERIL) 5 MG tablet Take 5 mg by mouth 2 (two) times daily as needed for muscle spasms.     Ferrous Sulfate (IRON) 325 (65 Fe) MG TABS Take 1 tablet (325 mg total) by mouth daily. 30 tablet 0   furosemide (LASIX) 40 MG tablet Take 40 mg by mouth 2 (two) times daily.     gabapentin (NEURONTIN) 300 MG capsule Take 300 mg by mouth as needed (for pain).     lactulose (CONSTULOSE) 10 GM/15ML solution Take 45 mLs (30 g total) by mouth 3 (three) times daily. 946 mL 2   levOCARNitine (CARNITOR) 330 MG tablet Take 330 mg by mouth 3 (three) times daily.     Multiple Vitamin (MULTIVITAMIN) tablet Take 1 tablet by mouth daily.     omeprazole (PRILOSEC) 20 MG capsule Take 1 capsule (20 mg total) by mouth daily. 30 capsule 5   ondansetron (ZOFRAN) 4 MG tablet TAKE 1 TABLET BY MOUTH EVERY 6 HOURS AS NEEDED FOR NAUSEA OR FOR VOMITING 30 tablet 3   pantoprazole (PROTONIX) 40 MG tablet Take 1 tablet (40 mg total) by mouth daily. 30 minutes before breakfast. Take instead of omeprazole 90 tablet 3   spironolactone (ALDACTONE) 100 MG tablet Take 100 mg by mouth 2 (two) times daily.     ursodiol (ACTIGALL) 300 MG capsule Take 300 mg by mouth 2 (two) times daily.     XIFAXAN 550 MG TABS tablet TAKE 1 TABLET BY MOUTH TWICE DAILY 180 tablet 3   Zinc 100 MG TABS Take 1 tablet by mouth daily.     No current facility-administered medications for this visit.    Allergies as of 03/15/2022 - Review Complete 03/15/2022  Allergen Reaction Noted   Chlorthalidone  11/04/2019   Metoprolol  11/04/2019    Family History  Problem Relation Age of Onset   Brain cancer Mother    Diabetes Mother    Diabetes Father    Pulmonary embolism Brother    Liver disease Neg Hx     Social History   Socioeconomic History   Marital status: Single    Spouse name: Not on file   Number of children: Not on file   Years of education: Not on file   Highest education level: Not on file   Occupational History   Occupation: umemployed  Tobacco Use   Smoking status: Never    Passive exposure: Current   Smokeless tobacco: Never  Vaping Use   Vaping Use: Never used  Substance and Sexual Activity   Alcohol use: Never   Drug use: Never   Sexual activity: Not Currently  Other Topics Concern   Not on file  Social History Narrative   Not on file   Social Determinants of Health   Financial Resource Strain: Not on file  Food Insecurity: Not on file  Transportation Needs: Not on file  Physical Activity: Not on file  Stress: Not on file  Social Connections: Not on file  Intimate Partner Violence: Not on file     Review of Systems   Gen: Denies any fever, chills, fatigue, weight loss, lack of appetite.  CV: Denies chest pain, heart palpitations, peripheral edema, syncope.  Resp: Denies shortness of breath at rest or  with exertion. Denies wheezing or cough.  GI: see HPI GU : Denies urinary burning, urinary frequency, urinary hesitancy MS: Denies joint pain, muscle weakness, cramps, or limitation of movement.  Derm: Denies rash, itching, dry skin Psych: Denies depression, anxiety, memory loss, and confusion Heme: see HPI   Physical Exam   BP 123/85 (BP Location: Right Arm, Patient Position: Sitting, Cuff Size: Large)   Pulse 92   Temp 98.7 F (37.1 C) (Oral)   Ht 5' 8"  (1.727 m)   Wt 269 lb 8 oz (122.2 kg)   BMI 40.98 kg/m  General:   Alert and oriented. Pleasant and cooperative. Well-nourished and well-developed.  Head:  Normocephalic and atraumatic. Eyes:  Without icterus Abdomen:  +BS, soft, TTP RUQ and non-distended.  Rectal:  Deferred  Msk:  Symmetrical without gross deformities. Normal posture. Extremities:  Without edema. Neurologic:  Alert and  oriented x4 Skin:  Intact without significant lesions or rashes. Psych:  Alert and cooperative. Normal mood and affect.   Assessment   Alejandro Porter is a 46 y.o. male presenting today in follow-up  with a history of  cirrhosis complicated by portal hypertension, esophageal varices, portal vein thrombosis now off Eliquis, hepatic encephalopathy on lactulose and Xfiaxan, ascites requiring repeat paras, history of SBP in April 2022, IDA followed by Hematology, biliary dyskinesia but not a surgical candidate, and undergoing liver transplant eval at [facility].  Cirrhosis: last para 02/17/22. CT in July 2023 with liver protocol by Roosevelt Locks, NP, showed no mass or suspicious contrast enhancement corresponding to US findings but slight contour deformity of peripheral liver dome in general vicinity of previously demonstrated subcapsular liver lesion as seen on comparison ultrasound. Discussed with Roosevelt Locks and we will order MRI. AFP normal.  Routine labs today. Continue diuretic dosing.   Esophageal varices: not a candidate for non-selective blocker after history of SBP. Next EGD due in Jan 2024. He is having persistent dyspepsia likely secondary to known biliary dyskinesia; however, we will update EGD now to exclude any occult issues.   History of SBP: Cipro 500 mg daily.   Rectal bleeding: in setting of diarrhea while taking lactulose. Only has diarrhea with lactulose and difficulty tolerating this despite smaller doses. Stop lactulose and will start Miralax. History of multiple adenomas with surveillance actually due next year. In light of rectal bleeding, will update now.   History of HE: continue Xifaxan BID. Stop lactulose as unable to tolerate. Start MIralax daily to BID. Goal 3 soft BMs daily.    MELD 3.0 is 22 after review of labs done after visit.    PLAN   Proceed with colonoscopy due to rectal bleeding and EGD due to dyspepsia, hx of cirrhosis with varices by Dr. Abbey Chatters  in near future: the risks, benefits, and alternatives have been discussed with the patient in detail. The patient states understanding and desires to proceed.  Labs done today MRI of liver upcoming Referral to  psychiatry due to anxiety/depression Continue Xifaxan. Stop lactulose. Start Miralax with goal of 3 BMs daily Return in 2 months    Annitta Needs, PhD, [facility]

## 2022-03-15 NOTE — Progress Notes (Signed)
Gastroenterology Office Note     Primary Care Physician:  Sofie Rower, PA-C  Primary Gastroenterologist: Dr. Gala Romney    Chief Complaint   Chief Complaint  Patient presents with   Abdominal Pain    Upper abdominal pain after eating and drinking. Goes away after taking gabapentin. Took 6 tablets yesterday. Diarrhea with blood in stool and nausea.      History of Present Illness   Alejandro Porter is a 46 y.o. male presenting today in follow-up with a history of cirrhosis complicated by portal hypertension, esophageal varices, portal vein thrombosis now off Eliquis, hepatic encephalopathy on lactulose and Xfiaxan, ascites requiring repeat paras, history of SBP in April 2022, IDA followed by Hematology, biliary dyskinesia but not a surgical candidate, and undergoing liver transplant eval at [facility].   Will have regular BMs then diarrhea. Majority of time is diarrhea. Stomach hurting this morning. Taking gabapentin that helps with abdominal pain. Some intermittent hematochezia. Saw what looked like black tar yesterday when having diarrhea but then turned brown.   Taking lactulose 3 times a day. Having diarrhea if taking lactulose. Cramping in stomach with lactulose.  Had 3 soft BMs this morning.   Frustrated regarding chronic biliary pain. Anxious and depressed. No homicidal or suicidal ideation. No improvement with trial of Urso. CT in July 2023 with liver protocol by Roosevelt Locks, NP, showed no mass or suspicious contrast enhancement corresponding to US findings but slight contour deformity of peripheral liver dome in general vicinity of previously demonstrated subcapsular liver lesion as seen on comparison ultrasound. Discussed with Roosevelt Locks and we will order MRI. AFP normal.    History of multiple adenomas with surveillance due next year; however, he is having low-volume rectal bleeding intermittently.    Past Medical History:  Diagnosis Date   CKD (chronic kidney  disease) stage 2, GFR 60-89 ml/min    H/O colonoscopy    H/O endoscopy    Hypertension    Liver cirrhosis (HCC)    Neuropathy    OSA (obstructive sleep apnea)    S/P abdominal paracentesis     Past Surgical History:  Procedure Laterality Date   BIOPSY  09/12/2019   Procedure: BIOPSY;  Surgeon: Daneil Dolin, MD;  Location: [facility];  Service: Endoscopy;;   COLONOSCOPY N/A 09/18/2019   Dr. Oneida Alar: 12 colon polyps removed, multiple simple adenomas and some inflammatory polyps, diverticulosis, external and internal hemorrhoids.  Next colonoscopy in 3 years.   ESOPHAGEAL BANDING N/A 09/12/2019   Procedure: ESOPHAGEAL BANDING;  Surgeon: Daneil Dolin, MD;  Location: [facility];  Service: Endoscopy;  Laterality: N/A;   ESOPHAGOGASTRODUODENOSCOPY N/A 09/12/2019   Dr. Gala Romney: Mild erosive reflux esophagitis.  Schatzki ring status post dilation.  Small grade 1/grade 2 esophageal varices, portal hypertensive gastropathy, hyperplastic gastric polyp, gastric erosions with chronic inactive gastritis on biopsies, no H. pylori.   ESOPHAGOGASTRODUODENOSCOPY (EGD) WITH PROPOFOL N/A 12/16/2020   two short columns of no more than Grade 2 esophageal varices, appearing innocent. Grade 1 varices not apparent today. Multiple large posterior body and antral hyperplastic, hemorrhagic appearing polyps, larges approximately 2.5 cm pedunculated. Portal gastropathy. Normal duodenum. Polypectomy with clips placed. EGD in 18 months.   POLYPECTOMY  09/18/2019   Procedure: POLYPECTOMY;  Surgeon: Danie Binder, MD;  [facility]: [facility];  Service: Endoscopy;;   POLYPECTOMY  12/16/2020   Procedure: POLYPECTOMY;  Surgeon: Daneil Dolin, MD;  Location: [facility];  Service: Endoscopy;;  gastric  Current Outpatient Medications  Medication Sig Dispense Refill   Cholecalciferol (VITAMIN D3) 125 MCG (5000 UT) TABS Take 5,000 Units by mouth daily.     ciprofloxacin (CIPRO) 500 MG tablet TAKE 1 TABLET BY  MOUTH DAILY 30 tablet 5   cyclobenzaprine (FLEXERIL) 5 MG tablet Take 5 mg by mouth 2 (two) times daily as needed for muscle spasms.     Ferrous Sulfate (IRON) 325 (65 Fe) MG TABS Take 1 tablet (325 mg total) by mouth daily. 30 tablet 0   furosemide (LASIX) 40 MG tablet Take 40 mg by mouth 2 (two) times daily.     gabapentin (NEURONTIN) 300 MG capsule Take 300 mg by mouth as needed (for pain).     lactulose (CONSTULOSE) 10 GM/15ML solution Take 45 mLs (30 g total) by mouth 3 (three) times daily. 946 mL 2   levOCARNitine (CARNITOR) 330 MG tablet Take 330 mg by mouth 3 (three) times daily.     Multiple Vitamin (MULTIVITAMIN) tablet Take 1 tablet by mouth daily.     omeprazole (PRILOSEC) 20 MG capsule Take 1 capsule (20 mg total) by mouth daily. 30 capsule 5   ondansetron (ZOFRAN) 4 MG tablet TAKE 1 TABLET BY MOUTH EVERY 6 HOURS AS NEEDED FOR NAUSEA OR FOR VOMITING 30 tablet 3   pantoprazole (PROTONIX) 40 MG tablet Take 1 tablet (40 mg total) by mouth daily. 30 minutes before breakfast. Take instead of omeprazole 90 tablet 3   spironolactone (ALDACTONE) 100 MG tablet Take 100 mg by mouth 2 (two) times daily.     ursodiol (ACTIGALL) 300 MG capsule Take 300 mg by mouth 2 (two) times daily.     XIFAXAN 550 MG TABS tablet TAKE 1 TABLET BY MOUTH TWICE DAILY 180 tablet 3   Zinc 100 MG TABS Take 1 tablet by mouth daily.     No current facility-administered medications for this visit.    Allergies as of 03/15/2022 - Review Complete 03/15/2022  Allergen Reaction Noted   Chlorthalidone  11/04/2019   Metoprolol  11/04/2019    Family History  Problem Relation Age of Onset   Brain cancer Mother    Diabetes Mother    Diabetes Father    Pulmonary embolism Brother    Liver disease Neg Hx     Social History   Socioeconomic History   Marital status: Single    Spouse name: Not on file   Number of children: Not on file   Years of education: Not on file   Highest education level: Not on file   Occupational History   Occupation: umemployed  Tobacco Use   Smoking status: Never    Passive exposure: Current   Smokeless tobacco: Never  Vaping Use   Vaping Use: Never used  Substance and Sexual Activity   Alcohol use: Never   Drug use: Never   Sexual activity: Not Currently  Other Topics Concern   Not on file  Social History Narrative   Not on file   Social Determinants of Health   Financial Resource Strain: Not on file  Food Insecurity: Not on file  Transportation Needs: Not on file  Physical Activity: Not on file  Stress: Not on file  Social Connections: Not on file  Intimate Partner Violence: Not on file     Review of Systems   Gen: Denies any fever, chills, fatigue, weight loss, lack of appetite.  CV: Denies chest pain, heart palpitations, peripheral edema, syncope.  Resp: Denies shortness of breath at rest or  with exertion. Denies wheezing or cough.  GI: see HPI GU : Denies urinary burning, urinary frequency, urinary hesitancy MS: Denies joint pain, muscle weakness, cramps, or limitation of movement.  Derm: Denies rash, itching, dry skin Psych: Denies depression, anxiety, memory loss, and confusion Heme: see HPI   Physical Exam   BP 123/85 (BP Location: Right Arm, Patient Position: Sitting, Cuff Size: Large)   Pulse 92   Temp 98.7 F (37.1 C) (Oral)   Ht 5' 8"  (1.727 m)   Wt 269 lb 8 oz (122.2 kg)   BMI 40.98 kg/m  General:   Alert and oriented. Pleasant and cooperative. Well-nourished and well-developed.  Head:  Normocephalic and atraumatic. Eyes:  Without icterus Abdomen:  +BS, soft, TTP RUQ and non-distended.  Rectal:  Deferred  Msk:  Symmetrical without gross deformities. Normal posture. Extremities:  Without edema. Neurologic:  Alert and  oriented x4 Skin:  Intact without significant lesions or rashes. Psych:  Alert and cooperative. Normal mood and affect.   Assessment   Alejandro Porter is a 46 y.o. male presenting today in follow-up  with a history of  cirrhosis complicated by portal hypertension, esophageal varices, portal vein thrombosis now off Eliquis, hepatic encephalopathy on lactulose and Xfiaxan, ascites requiring repeat paras, history of SBP in April 2022, IDA followed by Hematology, biliary dyskinesia but not a surgical candidate, and undergoing liver transplant eval at [facility].  Cirrhosis: last para 02/17/22. CT in July 2023 with liver protocol by Roosevelt Locks, NP, showed no mass or suspicious contrast enhancement corresponding to US findings but slight contour deformity of peripheral liver dome in general vicinity of previously demonstrated subcapsular liver lesion as seen on comparison ultrasound. Discussed with Roosevelt Locks and we will order MRI. AFP normal.  Routine labs today. Continue diuretic dosing.   Esophageal varices: not a candidate for non-selective blocker after history of SBP. Next EGD due in Jan 2024. He is having persistent dyspepsia likely secondary to known biliary dyskinesia; however, we will update EGD now to exclude any occult issues.   History of SBP: Cipro 500 mg daily.   Rectal bleeding: in setting of diarrhea while taking lactulose. Only has diarrhea with lactulose and difficulty tolerating this despite smaller doses. Stop lactulose and will start Miralax. History of multiple adenomas with surveillance actually due next year. In light of rectal bleeding, will update now.   History of HE: continue Xifaxan BID. Stop lactulose as unable to tolerate. Start MIralax daily to BID. Goal 3 soft BMs daily.    MELD 3.0 is 22 after review of labs done after visit.    PLAN   Proceed with colonoscopy due to rectal bleeding and EGD due to dyspepsia, hx of cirrhosis with varices by Dr. Abbey Chatters  in near future: the risks, benefits, and alternatives have been discussed with the patient in detail. The patient states understanding and desires to proceed.  Labs done today MRI of liver upcoming Referral to  psychiatry due to anxiety/depression Continue Xifaxan. Stop lactulose. Start Miralax with goal of 3 BMs daily Return in 2 months    Annitta Needs, PhD, [facility]

## 2022-03-15 NOTE — Patient Instructions (Signed)
Please have blood work done today. We are ordering an MRI of your liver We are arranging a colonoscopy and upper endoscopy in the near future I am referring you to Psychiatry. You might need a referral from your primary care provider. We will see.  5. Stop lactulose as this is causing too much diarrhea. Continue Xifaxan twice a day. Take Miralax 1 capful in 8 ounces of water daily. I want you to have at least 3 soft bowel movements a day. If you don't, we need to increase the Miralax. Please call us next week with how this is working.  6. We will see you back in 2 months   I enjoyed seeing you again today! As you know, I value our relationship and want to provide genuine, compassionate, and quality care. I welcome your feedback. If you receive a survey regarding your visit,  I greatly appreciate you taking time to fill this out. See you next time!  Annitta Needs, PhD, ANP-BC [facility]

## 2022-03-16 ENCOUNTER — Other Ambulatory Visit: Payer: Self-pay

## 2022-03-16 DIAGNOSIS — K7460 Unspecified cirrhosis of liver: Secondary | ICD-10-CM

## 2022-03-17 LAB — COMPLETE METABOLIC PANEL WITH GFR
AG Ratio: 1 (calc) (ref 1.0–2.5)
ALT: 42 U/L (ref 9–46)
AST: 42 U/L — ABNORMAL HIGH (ref 10–40)
Albumin: 4 g/dL (ref 3.6–5.1)
Alkaline phosphatase (APISO): 142 U/L — ABNORMAL HIGH (ref 36–130)
BUN/Creatinine Ratio: 13 (calc) (ref 6–22)
BUN: 21 mg/dL (ref 7–25)
CO2: 26 mmol/L (ref 20–32)
Calcium: 9.8 mg/dL (ref 8.6–10.3)
Chloride: 91 mmol/L — ABNORMAL LOW (ref 98–110)
Creat: 1.57 mg/dL — ABNORMAL HIGH (ref 0.60–1.29)
Globulin: 4.2 g/dL (calc) — ABNORMAL HIGH (ref 1.9–3.7)
Glucose, Bld: 103 mg/dL — ABNORMAL HIGH (ref 65–99)
Potassium: 5.6 mmol/L — ABNORMAL HIGH (ref 3.5–5.3)
Sodium: 125 mmol/L — ABNORMAL LOW (ref 135–146)
Total Bilirubin: 1 mg/dL (ref 0.2–1.2)
Total Protein: 8.2 g/dL — ABNORMAL HIGH (ref 6.1–8.1)
eGFR: 54 mL/min/{1.73_m2} — ABNORMAL LOW (ref >=60)

## 2022-03-17 LAB — PROTIME-INR
INR: 1.1
Prothrombin Time: 11.1 s (ref 9.0–11.5)

## 2022-03-17 LAB — CBC WITH DIFFERENTIAL/PLATELET
Absolute Monocytes: 810 cells/uL (ref 200–950)
Basophils Absolute: 18 cells/uL (ref 0–200)
Basophils Relative: 0.2 %
Eosinophils Absolute: 123 cells/uL (ref 15–500)
Eosinophils Relative: 1.4 %
HCT: 37.4 % — ABNORMAL LOW (ref 38.5–50.0)
Hemoglobin: 13.1 g/dL — ABNORMAL LOW (ref 13.2–17.1)
Lymphs Abs: 361 cells/uL — ABNORMAL LOW (ref 850–3900)
MCH: 31.3 pg (ref 27.0–33.0)
MCHC: 35 g/dL (ref 32.0–36.0)
MCV: 89.3 fL (ref 80.0–100.0)
MPV: 11.3 fL (ref 7.5–12.5)
Monocytes Relative: 9.2 %
Neutro Abs: 7489 cells/uL (ref 1500–7800)
Neutrophils Relative %: 85.1 %
Platelets: 105 10*3/uL — ABNORMAL LOW (ref 140–400)
RBC: 4.19 10*6/uL — ABNORMAL LOW (ref 4.20–5.80)
RDW: 13 % (ref 11.0–15.0)
Total Lymphocyte: 4.1 %
WBC: 8.8 10*3/uL (ref 3.8–10.8)

## 2022-03-17 LAB — AFP TUMOR MARKER: AFP-Tumor Marker: 2.2 ng/mL (ref <6.1)

## 2022-03-18 LAB — BASIC METABOLIC PANEL
BUN/Creatinine Ratio: 15 (calc) (ref 6–22)
BUN: 21 mg/dL (ref 7–25)
CO2: 26 mmol/L (ref 20–32)
Calcium: 9.7 mg/dL (ref 8.6–10.3)
Chloride: 91 mmol/L — ABNORMAL LOW (ref 98–110)
Creat: 1.44 mg/dL — ABNORMAL HIGH (ref 0.60–1.29)
Glucose, Bld: 145 mg/dL — ABNORMAL HIGH (ref 65–139)
Potassium: 5.3 mmol/L (ref 3.5–5.3)
Sodium: 126 mmol/L — ABNORMAL LOW (ref 135–146)

## 2022-03-21 ENCOUNTER — Other Ambulatory Visit: Payer: Self-pay

## 2022-03-21 DIAGNOSIS — K7200 Acute and subacute hepatic failure without coma: Secondary | ICD-10-CM

## 2022-03-21 DIAGNOSIS — K766 Portal hypertension: Secondary | ICD-10-CM

## 2022-03-21 DIAGNOSIS — R188 Other ascites: Secondary | ICD-10-CM

## 2022-03-22 ENCOUNTER — Encounter (HOSPITAL_COMMUNITY)
Admission: RE | Admit: 2022-03-22 | Discharge: 2022-03-22 | Disposition: A | Discharge status: Home / Self Care | Payer: Medicaid Other | Source: Ambulatory Visit | Attending: Internal Medicine | Admitting: Internal Medicine

## 2022-03-22 ENCOUNTER — Encounter (HOSPITAL_COMMUNITY): Payer: Self-pay

## 2022-03-22 HISTORY — DX: Gastro-esophageal reflux disease without esophagitis: K21.9

## 2022-03-24 ENCOUNTER — Other Ambulatory Visit: Payer: Self-pay

## 2022-03-24 ENCOUNTER — Ambulatory Visit (HOSPITAL_COMMUNITY)
Admission: RE | Admit: 2022-03-24 | Discharge: 2022-03-24 | Disposition: A | Discharge status: Home / Self Care | Payer: Medicaid Other | Attending: Internal Medicine | Admitting: Internal Medicine

## 2022-03-24 ENCOUNTER — Encounter (HOSPITAL_COMMUNITY)
Admission: RE | Disposition: A | Discharge status: Home / Self Care | Payer: Self-pay | Source: Home / Self Care | Attending: Internal Medicine

## 2022-03-24 ENCOUNTER — Encounter (HOSPITAL_COMMUNITY): Payer: Self-pay

## 2022-03-24 ENCOUNTER — Ambulatory Visit (HOSPITAL_COMMUNITY): Payer: Medicaid Other | Admitting: Anesthesiology

## 2022-03-24 ENCOUNTER — Ambulatory Visit (HOSPITAL_BASED_OUTPATIENT_CLINIC_OR_DEPARTMENT_OTHER): Payer: Medicaid Other | Admitting: Anesthesiology

## 2022-03-24 DIAGNOSIS — K766 Portal hypertension: Secondary | ICD-10-CM

## 2022-03-24 DIAGNOSIS — K30 Functional dyspepsia: Secondary | ICD-10-CM | POA: Insufficient documentation

## 2022-03-24 DIAGNOSIS — K317 Polyp of stomach and duodenum: Secondary | ICD-10-CM | POA: Insufficient documentation

## 2022-03-24 DIAGNOSIS — Z6841 Body Mass Index (BMI) 40.0 and over, adult: Secondary | ICD-10-CM | POA: Insufficient documentation

## 2022-03-24 DIAGNOSIS — K625 Hemorrhage of anus and rectum: Secondary | ICD-10-CM | POA: Diagnosis not present

## 2022-03-24 DIAGNOSIS — K7460 Unspecified cirrhosis of liver: Secondary | ICD-10-CM | POA: Diagnosis not present

## 2022-03-24 DIAGNOSIS — K3189 Other diseases of stomach and duodenum: Secondary | ICD-10-CM

## 2022-03-24 DIAGNOSIS — I8510 Secondary esophageal varices without bleeding: Secondary | ICD-10-CM | POA: Insufficient documentation

## 2022-03-24 DIAGNOSIS — I868 Varicose veins of other specified sites: Secondary | ICD-10-CM | POA: Diagnosis not present

## 2022-03-24 DIAGNOSIS — K648 Other hemorrhoids: Secondary | ICD-10-CM | POA: Diagnosis not present

## 2022-03-24 DIAGNOSIS — K2980 Duodenitis without bleeding: Secondary | ICD-10-CM | POA: Diagnosis not present

## 2022-03-24 DIAGNOSIS — F32A Depression, unspecified: Secondary | ICD-10-CM | POA: Insufficient documentation

## 2022-03-24 DIAGNOSIS — K219 Gastro-esophageal reflux disease without esophagitis: Secondary | ICD-10-CM | POA: Diagnosis not present

## 2022-03-24 DIAGNOSIS — F419 Anxiety disorder, unspecified: Secondary | ICD-10-CM | POA: Insufficient documentation

## 2022-03-24 HISTORY — PX: ESOPHAGOGASTRODUODENOSCOPY (EGD) WITH PROPOFOL: SHX5813

## 2022-03-24 HISTORY — PX: COLONOSCOPY WITH PROPOFOL: SHX5780

## 2022-03-24 HISTORY — PX: POLYPECTOMY: SHX5525

## 2022-03-24 SURGERY — COLONOSCOPY WITH PROPOFOL
Anesthesia: General

## 2022-03-24 MED ORDER — LACTATED RINGERS IV SOLN: INTRAVENOUS | Status: DC | PRN

## 2022-03-24 MED ORDER — PROPOFOL 500 MG/50ML IV EMUL
INTRAVENOUS | Status: DC | PRN
  Administered 2022-03-24: 150 ug/kg/min via INTRAVENOUS

## 2022-03-24 MED ORDER — LIDOCAINE HCL (CARDIAC) PF 100 MG/5ML IV SOSY
PREFILLED_SYRINGE | INTRAVENOUS | Status: DC | PRN
  Administered 2022-03-24: 100 mg via INTRAVENOUS

## 2022-03-24 MED ORDER — PROPOFOL 10 MG/ML IV BOLUS
INTRAVENOUS | Status: DC | PRN
  Administered 2022-03-24: 40 mg via INTRAVENOUS
  Administered 2022-03-24: 50 mg via INTRAVENOUS
  Administered 2022-03-24: 80 mg via INTRAVENOUS
  Administered 2022-03-24: 40 mg via INTRAVENOUS

## 2022-03-24 NOTE — Discharge Instructions (Signed)
EGD Discharge instructions Please read the instructions outlined below and refer to this sheet in the next few weeks. These discharge instructions provide you with general information on caring for yourself after you leave the hospital. Your doctor may also give you specific instructions. While your treatment has been planned according to the most current medical practices available, unavoidable complications occasionally occur. If you have any problems or questions after discharge, please call your doctor. ACTIVITY You may resume your regular activity but move at a slower pace for the next 24 hours.  Take frequent rest periods for the next 24 hours.  Walking will help expel (get rid of) the air and reduce the bloated feeling in your abdomen.  No driving for 24 hours (because of the anesthesia (medicine) used during the test).  You may shower.  Do not sign any important legal documents or operate any machinery for 24 hours (because of the anesthesia used during the test).  NUTRITION Drink plenty of fluids.  You may resume your normal diet.  Begin with a light meal and progress to your normal diet.  Avoid alcoholic beverages for 24 hours or as instructed by your caregiver.  MEDICATIONS You may resume your normal medications unless your caregiver tells you otherwise.  WHAT YOU CAN EXPECT TODAY You may experience abdominal discomfort such as a feeling of fullness or "gas" pains.  FOLLOW-UP Your doctor will discuss the results of your test with you.  SEEK IMMEDIATE MEDICAL ATTENTION IF ANY OF THE FOLLOWING OCCUR: Excessive nausea (feeling sick to your stomach) and/or vomiting.  Severe abdominal pain and distention (swelling).  Trouble swallowing.  Temperature over 101 F (37.8 C).  Rectal bleeding or vomiting of blood.     Colonoscopy Discharge Instructions  Read the instructions outlined below and refer to this sheet in the next few weeks. These discharge instructions provide you with  general information on caring for yourself after you leave the hospital. Your doctor may also give you specific instructions. While your treatment has been planned according to the most current medical practices available, unavoidable complications occasionally occur.   ACTIVITY You may resume your regular activity, but move at a slower pace for the next 24 hours.  Take frequent rest periods for the next 24 hours.  Walking will help get rid of the air and reduce the bloated feeling in your belly (abdomen).  No driving for 24 hours (because of the medicine (anesthesia) used during the test).   Do not sign any important legal documents or operate any machinery for 24 hours (because of the anesthesia used during the test).  NUTRITION Drink plenty of fluids.  You may resume your normal diet as instructed by your doctor.  Begin with a light meal and progress to your normal diet. Heavy or fried foods are harder to digest and may make you feel sick to your stomach (nauseated).  Avoid alcoholic beverages for 24 hours or as instructed.  MEDICATIONS You may resume your normal medications unless your doctor tells you otherwise.  WHAT YOU CAN EXPECT TODAY Some feelings of bloating in the abdomen.  Passage of more gas than usual.  Spotting of blood in your stool or on the toilet paper.  IF YOU HAD POLYPS REMOVED DURING THE COLONOSCOPY: No aspirin products for 7 days or as instructed.  No alcohol for 7 days or as instructed.  Eat a soft diet for the next 24 hours.  FINDING OUT THE RESULTS OF YOUR TEST Not all test results are  available during your visit. If your test results are not back during the visit, make an appointment with your caregiver to find out the results. Do not assume everything is normal if you have not heard from your caregiver or the medical facility. It is important for you to follow up on all of your test results.  SEEK IMMEDIATE MEDICAL ATTENTION IF: You have more than a spotting of  blood in your stool.  Your belly is swollen (abdominal distention).  You are nauseated or vomiting.  You have a temperature over 101.  You have abdominal pain or discomfort that is severe or gets worse throughout the day.   Your upper endoscopy showed 1 column of small esophageal varices.  No treatment needed.  You again had multiple large polyps in your stomach.  I removed two of these.  Await pathology results, my office will contact you.  Inflammation noted in your stomach and duodenum.  Repeat EGD in 1 year.  Your colon was not entirely cleaned out.  I did not see any large polyps or evidence of colon cancer.  Would recommend repeat colonoscopy in 1 year at the same time as EGD with extended colon prep.  Follow-up with GI in 6 to 8 weeks.  I hope you have a great rest of your week!  Elon Alas. Abbey Chatters, D.O. Gastroenterology and Hepatology [facility]

## 2022-03-24 NOTE — Op Note (Signed)
[facility] Patient Name: Alejandro Porter Procedure Date: 03/24/2022 12:59 PM MRN: [id number] Date of Birth: 07/26/1975 Attending MD: Elon Alas. Abbey Chatters DO CSN: [id number] Age: 46 Admit Type: Outpatient Procedure:                Colonoscopy Indications:              Rectal bleeding Providers:                Elon Alas. Abbey Chatters, DO, Janeece Riggers, RN, Aram Candela Referring MD:              Medicines:                See the Anesthesia note for documentation of the                            administered medications Complications:            No immediate complications. Estimated Blood Loss:     Estimated blood loss was minimal. Procedure:                Pre-Anesthesia Assessment:                           - The anesthesia plan was to use monitored                            anesthesia care (MAC).                           After obtaining informed consent, the colonoscope                            was passed under direct vision. Throughout the                            procedure, the patient's blood pressure, pulse, and                            oxygen saturations were monitored continuously. The                            [id number]-HQ190L ([id number]) scope was introduced through                            the anus and advanced to the the cecum, identified                            by appendiceal orifice and ileocecal valve. The                            colonoscopy was performed without difficulty. The                            patient tolerated the procedure well. The quality                            of the bowel  preparation was evaluated using the                            BBPS [facility] Bowel Preparation Scale) with scores                            of: Right Colon = 2 (minor amount of residual                            staining, small fragments of stool and/or opaque                            liquid, but mucosa seen well), Transverse Colon = 2                            (minor  amount of residual staining, small fragments                            of stool and/or opaque liquid, but mucosa seen                            well) and Left Colon = 2 (minor amount of residual                            staining, small fragments of stool and/or opaque                            liquid, but mucosa seen well). The total BBPS score                            equals 6. The quality of the bowel preparation was                            fair. Scope In: 1:33:20 PM Scope Out: 1:46:23 PM Scope Withdrawal Time: 0 hours 10 minutes 41 seconds  Total Procedure Duration: 0 hours 13 minutes 3 seconds  Findings:      The perianal and digital rectal examinations were normal.      Non-bleeding internal hemorrhoids were found during endoscopy. Rectal       varices noted.      A large amount of semi-solid stool was found in the transverse colon, in       the ascending colon and in the cecum, making visualization difficult.       Lavage of the area was performed using copious amounts of sterile water,       resulting in clearance with fair visualization. Impression:               - Preparation of the colon was fair.                           - Non-bleeding internal hemorrhoids. Rectal varices                           - Stool in the transverse colon, in  the ascending                            colon and in the cecum.                           - No specimens collected. Moderate Sedation:      Per Anesthesia Care Recommendation:           - Patient has a contact number available for                            emergencies. The signs and symptoms of potential                            delayed complications were discussed with the                            patient. Return to normal activities tomorrow.                            Written discharge instructions were provided to the                            patient.                           - Resume previous diet.                            - Continue present medications.                           - Repeat colonoscopy in 1 year for surveillance at                            same time as repeat EGD due to borderline prep today                           - Return to GI clinic in 6 weeks. Procedure Code(s):        --- Professional ---                           [phone], Colonoscopy, flexible; diagnostic, including                            collection of specimen(s) by brushing or washing,                            when performed (separate procedure) Diagnosis Code(s):        --- Professional ---                           W11.9, Other hemorrhoids                           K62.5, Hemorrhage of  anus and rectum CPT copyright 2019 American Medical Association. All rights reserved. The codes documented in this report are preliminary and upon coder review may  be revised to meet current compliance requirements. Elon Alas. Abbey Chatters, DO Elrod Abbey Chatters, DO 03/24/2022 1:50:46 PM This report has been signed electronically. Number of Addenda: 0

## 2022-03-24 NOTE — Anesthesia Preprocedure Evaluation (Signed)
Anesthesia Evaluation  Patient identified by MRN, date of birth, ID band Patient awake    Reviewed: Allergy & Precautions, H&P , NPO status , Patient's Chart, lab work & pertinent test results, reviewed documented beta blocker date and time   Airway Mallampati: II  TM Distance: >3 FB Neck ROM: full    Dental no notable dental hx.    Pulmonary sleep apnea ,    Pulmonary exam normal breath sounds clear to auscultation       Cardiovascular Exercise Tolerance: Good hypertension, negative cardio ROS   Rhythm:regular Rate:Normal     Neuro/Psych negative neurological ROS  negative psych ROS   GI/Hepatic Neg liver ROS, GERD  Medicated,  Endo/Other  Morbid obesity  Renal/GU CRFRenal disease  negative genitourinary   Musculoskeletal   Abdominal   Peds  Hematology  (+) Blood dyscrasia, anemia ,   Anesthesia Other Findings   Reproductive/Obstetrics negative OB ROS                             Anesthesia Physical Anesthesia Plan  ASA: 3  Anesthesia Plan: General   Post-op Pain Management:    Induction:   PONV Risk Score and Plan: Propofol infusion  Airway Management Planned:   Additional Equipment:   Intra-op Plan:   Post-operative Plan:   Informed Consent: I have reviewed the patients History and Physical, chart, labs and discussed the procedure including the risks, benefits and alternatives for the proposed anesthesia with the patient or authorized representative who has indicated his/her understanding and acceptance.     Dental Advisory Given  Plan Discussed with: CRNA  Anesthesia Plan Comments:         Anesthesia Quick Evaluation

## 2022-03-24 NOTE — Transfer of Care (Signed)
Immediate Anesthesia Transfer of Care Note  Patient: Alejandro Porter  Procedure(s) Performed: COLONOSCOPY WITH PROPOFOL ESOPHAGOGASTRODUODENOSCOPY (EGD) WITH PROPOFOL POLYPECTOMY  Patient Location: PACU  Anesthesia Type:MAC  Level of Consciousness: drowsy  Airway & Oxygen Therapy: Patient Spontanous Breathing  Post-op Assessment: Report given to RN and Post -op Vital signs reviewed and stable  Post vital signs: Reviewed and stable  Last Vitals:  Vitals Value Taken Time  BP 97/56 03/24/22 1353  Temp 36.7 C 03/24/22 1353  Pulse 89 03/24/22 1353  Resp 19 03/24/22 1353  SpO2 96 % 03/24/22 1353    Last Pain:  Vitals:   03/24/22 1353  TempSrc: Oral  PainSc: Asleep      Patients Stated Pain Goal: 6 (74/08/14 4818)  Complications: No notable events documented.

## 2022-03-24 NOTE — Interval H&P Note (Signed)
History and Physical Interval Note:  03/24/2022 1:00 PM  Alejandro Porter  has presented today for surgery, with the diagnosis of rectal bleeding, dyspepsia.  The various methods of treatment have been discussed with the patient and family. After consideration of risks, benefits and other options for treatment, the patient has consented to  Procedure(s) with comments: COLONOSCOPY WITH PROPOFOL (N/A) - 1:30pm, asa 3 ESOPHAGOGASTRODUODENOSCOPY (EGD) WITH PROPOFOL (N/A) as a surgical intervention.  The patient's history has been reviewed, patient examined, no change in status, stable for surgery.  I have reviewed the patient's chart and labs.  Questions were answered to the patient's satisfaction.     Eloise Harman

## 2022-03-24 NOTE — Op Note (Signed)
[facility] Patient Name: Alejandro Porter Procedure Date: 03/24/2022 1:01 PM MRN: [id number] Date of Birth: 1975-12-03 Attending MD: Elon Alas. Abbey Chatters DO CSN: [id number] Age: 46 Admit Type: Outpatient Procedure:                Upper GI endoscopy Indications:              Functional Dyspepsia, Follow-up of esophageal                            varices Providers:                Elon Alas. Abbey Chatters, DO, Janeece Riggers, RN, Aram Candela Referring MD:              Medicines:                See the Anesthesia note for documentation of the                            administered medications Complications:            No immediate complications. Estimated Blood Loss:     Estimated blood loss was minimal. Procedure:                Pre-Anesthesia Assessment:                           - The anesthesia plan was to use monitored                            anesthesia care (MAC).                           After obtaining informed consent, the endoscope was                            passed under direct vision. Throughout the                            procedure, the patient's blood pressure, pulse, and                            oxygen saturations were monitored continuously. The                            GIF-H190 ([id number]) scope was introduced through the                            mouth, and advanced to the second part of duodenum.                            The upper GI endoscopy was accomplished without                            difficulty. The patient tolerated the procedure                            well. Scope In:  1:14:28 PM Scope Out: 1:27:37 PM Total Procedure Duration: 0 hours 13 minutes 9 seconds  Findings:      One column small (< 5 mm) varices were found in the lower third of the       esophagus.      Moderate portal hypertensive gastropathy was found in the entire       examined stomach.      Multiple polyps in pre pyloric region. 2 were 15-20 mm pedunculated       polyps  with no bleeding and no stigmata of recent bleeding. The 2       largest polyps were removed with a hot snare. Resection and retrieval       were complete. Retrieved with Jabier Mutton net. Patient with scheduled MRI in       the near future so clips were not placed on post polypectemy sites. No       bleeding identified at end of procedure.      Diffuse mild inflammation characterized by erosions and erythema was       found in the duodenal bulb and in the second portion of the duodenum. Impression:               - Small (< 5 mm) esophageal varices.                           - Portal hypertensive gastropathy.                           - Multiple gastric polyps. Resected and retrieved.                           - Duodenitis. Moderate Sedation:      Per Anesthesia Care Recommendation:           - Patient has a contact number available for                            emergencies. The signs and symptoms of potential                            delayed complications were discussed with the                            patient. Return to normal activities tomorrow.                            Written discharge instructions were provided to the                            patient.                           - Resume previous diet.                           - Continue present medications.                           - Await pathology results.                           -  Repeat upper endoscopy in 1 year for surveillance.                           - Return to GI clinic in 3 months.                           - Use a proton pump inhibitor PO BID.                           - No ibuprofen, naproxen, or other non-steroidal                            anti-inflammatory drugs. Procedure Code(s):        --- Professional ---                           [phone], Esophagogastroduodenoscopy, flexible,                            transoral; with removal of tumor(s), polyp(s), or                            other lesion(s) by snare  technique Diagnosis Code(s):        --- Professional ---                           I85.00, Esophageal varices without bleeding                           K76.6, Portal hypertension                           K31.89, Other diseases of stomach and duodenum                           K31.7, Polyp of stomach and duodenum                           K29.80, Duodenitis without bleeding                           K30, Functional dyspepsia CPT copyright 2019 American Medical Association. All rights reserved. The codes documented in this report are preliminary and upon coder review may  be revised to meet current compliance requirements. Elon Alas. Abbey Chatters, DO [place] Abbey Chatters, DO 03/24/2022 1:48:05 PM This report has been signed electronically. Number of Addenda: 0

## 2022-03-26 NOTE — Anesthesia Postprocedure Evaluation (Signed)
Anesthesia Post Note  Patient: Praneeth Bussey  Procedure(s) Performed: COLONOSCOPY WITH PROPOFOL ESOPHAGOGASTRODUODENOSCOPY (EGD) WITH PROPOFOL POLYPECTOMY  Patient location during evaluation: Phase II Anesthesia Type: General Level of consciousness: awake Pain management: pain level controlled Vital Signs Assessment: post-procedure vital signs reviewed and stable Respiratory status: spontaneous breathing and respiratory function stable Cardiovascular status: blood pressure returned to baseline and stable Postop Assessment: no headache and no apparent nausea or vomiting Anesthetic complications: no Comments: Late entry   No notable events documented.   Last Vitals:  Vitals:   03/24/22 1353 03/24/22 1422  BP: (!) 97/56 115/75  Pulse: 89 75  Resp: 19 18  Temp: 36.7 C   SpO2: 96% 98%    Last Pain:  Vitals:   03/24/22 1422  TempSrc:   PainSc: 0-No pain                 Louann Sjogren

## 2022-03-27 LAB — SURGICAL PATHOLOGY

## 2022-03-29 ENCOUNTER — Encounter (HOSPITAL_COMMUNITY): Payer: Self-pay | Admitting: Internal Medicine

## 2022-04-05 ENCOUNTER — Ambulatory Visit (HOSPITAL_COMMUNITY)
Admission: RE | Admit: 2022-04-05 | Discharge: 2022-04-05 | Disposition: A | Discharge status: Home / Self Care | Payer: Medicaid Other | Source: Ambulatory Visit | Attending: Gastroenterology | Admitting: Gastroenterology

## 2022-04-05 DIAGNOSIS — K7460 Unspecified cirrhosis of liver: Secondary | ICD-10-CM | POA: Diagnosis present

## 2022-04-05 LAB — BASIC METABOLIC PANEL WITH GFR
BUN/Creatinine Ratio: 10 (calc) (ref 6–22)
BUN: 14 mg/dL (ref 7–25)
CO2: 26 mmol/L (ref 20–32)
Calcium: 9 mg/dL (ref 8.6–10.3)
Chloride: 96 mmol/L — ABNORMAL LOW (ref 98–110)
Creat: 1.45 mg/dL — ABNORMAL HIGH (ref 0.60–1.29)
Glucose, Bld: 109 mg/dL — ABNORMAL HIGH (ref 65–99)
Potassium: 4.8 mmol/L (ref 3.5–5.3)
Sodium: 130 mmol/L — ABNORMAL LOW (ref 135–146)

## 2022-04-05 MED ORDER — GADOBUTROL 1 MMOL/ML IV SOLN
10.0000 mL | Freq: Once | INTRAVENOUS | Status: AC | PRN
  Administered 2022-04-05: 10 mL via INTRAVENOUS

## 2022-04-07 ENCOUNTER — Telehealth: Payer: Self-pay

## 2022-04-07 NOTE — Telephone Encounter (Signed)
[facility] phoned and and LMOVM regarding the pt's MRI report. They were advising that you be made aware.

## 2022-04-11 ENCOUNTER — Telehealth: Payer: Self-pay

## 2022-04-11 ENCOUNTER — Ambulatory Visit: Payer: Medicaid Other | Admitting: Gastroenterology

## 2022-04-11 NOTE — Telephone Encounter (Signed)
Alejandro Porter, I returned the pt's call and was advised by him that is the last week or so he has put on at least 20 lbs or more. He also reports both of his legs are very swollen. He said he fell out in his yard X 2 and hard a hard time getting up yesterday (he just wanted to mentioned that part of it). He states he feels like he needs to have fluid drawn off. The pt is also inquiring of his MRI he had done on 04/06/2022. I advised him that you had been off a few days and that you are covering the ED and we will get back to him as soon as we can. Pt expressed understanding. Please advise

## 2022-04-11 NOTE — Telephone Encounter (Signed)
Phoned and advised the pt of appt needed this week and he was advised of MRI report of which I told him that you will address further with him. Pt was transferred to the front for an appt.

## 2022-04-11 NOTE — Telephone Encounter (Signed)
Please put in an office visit with any APP this week. I do not have any openings tomorrow but might have on Thursday. I will address MRI separately.

## 2022-04-12 ENCOUNTER — Other Ambulatory Visit: Payer: Self-pay | Admitting: *Deleted

## 2022-04-12 DIAGNOSIS — I81 Portal vein thrombosis: Secondary | ICD-10-CM

## 2022-04-12 NOTE — Telephone Encounter (Signed)
noted 

## 2022-04-13 ENCOUNTER — Ambulatory Visit: Payer: Medicaid Other | Admitting: Gastroenterology

## 2022-04-13 ENCOUNTER — Other Ambulatory Visit: Payer: Self-pay | Admitting: *Deleted

## 2022-04-13 ENCOUNTER — Telehealth: Payer: Self-pay | Admitting: *Deleted

## 2022-04-13 ENCOUNTER — Encounter: Payer: Self-pay | Admitting: Gastroenterology

## 2022-04-13 VITALS — BP 115/77 | HR 82 | Temp 98.1°F | Ht 68.0 in | Wt 289.6 lb

## 2022-04-13 DIAGNOSIS — R188 Other ascites: Secondary | ICD-10-CM | POA: Diagnosis not present

## 2022-04-13 DIAGNOSIS — K7460 Unspecified cirrhosis of liver: Secondary | ICD-10-CM | POA: Diagnosis not present

## 2022-04-13 MED ORDER — LACTULOSE 10 GM/15ML PO SOLN: 20.0000 g | Freq: Two times a day (BID) | ORAL | 3 refills | Status: DC

## 2022-04-13 NOTE — Telephone Encounter (Signed)
Pt informed to be at [facility] at 8:45 am for a 9 am for [place] Para. Verbalized understanding

## 2022-04-13 NOTE — Patient Instructions (Addendum)
Let's continue lasix 40 milligrams twice a day, and resume spironolactone at 100 milligrams twice a day (instead of daily).  Let's repeat blood work in 1 week to make sure it is stable.   We are arranging a paracentesis. Please do not take diuretics on that day.   We will repeat the MRI in 3 months!  Please keep upcoming ultrasound for the vein in your liver.   Let's try lactulose 30 milliliters daily to twice a day. I want you to have 3 soft bowel movements a day. If you keep having diarrhea, let me know.  I would like to see you in 4-6 weeks!  I enjoyed seeing you again today! As you know, I value our relationship and want to provide genuine, compassionate, and quality care. I welcome your feedback. If you receive a survey regarding your visit,  I greatly appreciate you taking time to fill this out. See you next time!  Annitta Needs, PhD, ANP-BC [facility]

## 2022-04-13 NOTE — Progress Notes (Signed)
Gastroenterology Office Note     Primary Care Physician:  Sofie Rower, PA-C  Primary Gastroenterologist: Dr. Gala Romney   Chief Complaint   Chief Complaint  Patient presents with   Follow-up    Fluid on abdomen     History of Present Illness   Alejandro Porter is a 46 y.o. 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   EGD Oct 2023; small esophageal varices, portal gastropathy, multiple gastric polyps s/p resection and retrieval (hyperplastic) duodenitis. 1 year surveillance.   Colonoscopy Oct 2023: fair colon prep, non-bleeding internal hemorrhoids and rectal varices. Stool in colon. 1 year surveillance due to poor prep.   In interim from last visit, had 2 liver lesions on MRI, AFP tumor marker normal. Repeat MRI in 3 months. 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 At some point, he came off Eliquis.   Lasix 40 BID and aldactone spironolactone 100 mg daily. He has had recurrent tense ascites.   Miralax twice per day. Sometimes straining. Stopped lactulose due to significant diarrhea. 2 BMs per day on Miralax.   No jaundice, pruritus, mental status changes.     Past Medical History:  Diagnosis Date   CKD (chronic kidney disease) stage 2, GFR 60-89 ml/min    GERD (gastroesophageal reflux disease)    H/O colonoscopy    H/O endoscopy    Hypertension    Liver cirrhosis (HCC)    Neuropathy    OSA (obstructive sleep apnea)    S/P abdominal paracentesis     Past Surgical History:  Procedure Laterality Date   BIOPSY  09/12/2019   Procedure: BIOPSY;  Surgeon: Daneil Dolin, MD;  [facility];   Service: Endoscopy;;   COLONOSCOPY N/A 09/18/2019   Dr. Oneida Alar: 12 colon polyps removed, multiple simple adenomas and some inflammatory polyps, diverticulosis, external and internal hemorrhoids.  Next colonoscopy in 3 years.   COLONOSCOPY WITH PROPOFOL N/A 03/24/2022   Procedure: COLONOSCOPY WITH PROPOFOL;  Surgeon: Eloise Harman, DO;  Location: [facility];  Service: Endoscopy;  Laterality: N/A;  1:30pm, asa 3   ESOPHAGEAL BANDING N/A 09/12/2019   Procedure: ESOPHAGEAL BANDING;  Surgeon: Daneil Dolin, MD;  [facility]: [facility];  Service: Endoscopy;  Laterality: N/A;   ESOPHAGOGASTRODUODENOSCOPY N/A 09/12/2019   Dr. Gala Romney: Mild erosive reflux esophagitis.  Schatzki ring status post dilation.  Small grade 1/grade 2 esophageal varices, portal hypertensive gastropathy, hyperplastic gastric polyp, gastric erosions with chronic inactive gastritis on biopsies, no H. pylori.   ESOPHAGOGASTRODUODENOSCOPY (EGD) WITH PROPOFOL N/A 12/16/2020   two short columns of no more than Grade 2 esophageal varices, appearing innocent. Grade 1 varices not apparent today. Multiple large posterior body and antral hyperplastic, hemorrhagic appearing polyps, larges approximately 2.5 cm pedunculated. Portal gastropathy. Normal duodenum. Polypectomy with clips placed. EGD in 18 months.   ESOPHAGOGASTRODUODENOSCOPY (EGD) WITH PROPOFOL N/A 03/24/2022   Procedure: ESOPHAGOGASTRODUODENOSCOPY (EGD) WITH PROPOFOL;  Surgeon: Eloise Harman, DO;  [facility];  Service: Endoscopy;  Laterality: N/A;   POLYPECTOMY  09/18/2019   Procedure: POLYPECTOMY;  Surgeon: Danie Binder, MD;  [facility];  Service: Endoscopy;;   POLYPECTOMY  12/16/2020   Procedure: POLYPECTOMY;  Surgeon: Daneil Dolin, MD;  [facility];  Service: Endoscopy;;  gastric   POLYPECTOMY  03/24/2022   Procedure: POLYPECTOMY;  Surgeon: Eloise Harman, DO;  [facility];  Service: Endoscopy;;    Current  Outpatient Medications  Medication Sig Dispense Refill   apixaban (ELIQUIS) 5 MG TABS tablet Take 1 tablet (5 mg total) by mouth 2 (two) times daily. 60 tablet 3   Cholecalciferol (VITAMIN D3) 125 MCG (5000 UT) TABS Take 5,000 Units by mouth in the morning.     ciprofloxacin (CIPRO) 500 MG tablet TAKE 1 TABLET BY MOUTH DAILY 30 tablet 5   cyclobenzaprine (FLEXERIL) 5 MG tablet Take 5 mg by mouth 3 (three) times daily as needed for muscle spasms.     ferrous sulfate 325 (65 FE) MG EC tablet Take 325 mg by mouth in the morning and at bedtime.     furosemide (LASIX) 40 MG tablet Take 40 mg by mouth 2 (two) times daily.     gabapentin (NEURONTIN) 300 MG capsule Take 300 mg by mouth 3 (three) times daily as needed (pain.).     lactulose (CHRONULAC) 10 GM/15ML solution Take 30 mLs (20 g total) by mouth 2 (two) times daily. 473 mL 3   levOCARNitine (CARNITOR) 330 MG tablet Take 330 mg by mouth 3 (three) times daily.     Multiple Vitamin (MULTIVITAMIN) tablet Take 1 tablet by mouth in the morning.     ondansetron (ZOFRAN) 4 MG tablet TAKE 1 TABLET BY MOUTH EVERY 6 HOURS AS NEEDED FOR NAUSEA OR FOR VOMITING 30 tablet 3   pantoprazole (PROTONIX) 40 MG tablet Take 1 tablet (40 mg total) by mouth daily. 30 minutes before breakfast. Take instead of omeprazole 90 tablet 3   polyethylene glycol (MIRALAX / GLYCOLAX) 17 g packet Take 17 g by mouth 2 (two) times daily with a meal. (Breakfast & supper)     spironolactone (ALDACTONE) 100 MG tablet Take 100 mg by mouth 2 (two) times daily.     XIFAXAN 550 MG TABS tablet TAKE 1 TABLET BY MOUTH TWICE DAILY 180 tablet 3   Zinc 50 MG TABS Take 50 mg by mouth in the morning.     omeprazole (PRILOSEC) 20 MG capsule Take 1 capsule (20 mg total) by mouth daily. (Patient not taking: Reported on 03/16/2022) 30 capsule 5   No current facility-administered medications for this visit.    Allergies as of 04/13/2022 - Review Complete 04/13/2022  Allergen Reaction Noted    Chlorthalidone  11/04/2019   Metoprolol  11/04/2019    Family History  Problem Relation Age of Onset   Brain cancer Mother    Diabetes Mother    Diabetes Father    Pulmonary embolism Brother    Liver disease Neg Hx     Social History   Socioeconomic History   Marital status: Single    Spouse name: Not on file   Number of children: Not on file   Years of education: Not on file   Highest education level: Not on file  Occupational History   Occupation: umemployed  Tobacco Use   Smoking status: Never    Passive exposure: Current   Smokeless tobacco: Never  Vaping Use   Vaping Use: Never used  Substance and Sexual Activity   Alcohol use: Never   Drug use: Never   Sexual activity: Not Currently  Other Topics Concern   Not on file  Social History Narrative   Not on file  Social Determinants of Health   Financial Resource Strain: Not on file  Food Insecurity: Not on file  Transportation Needs: Not on file  Physical Activity: Not on file  Stress: Not on file  Social Connections: Not on file  Intimate Partner Violence: Not on file     Review of Systems   Gen: Denies any fever, chills, fatigue, weight loss, lack of appetite.  CV: Denies chest pain, heart palpitations, peripheral edema, syncope.  Resp: Denies shortness of breath at rest or with exertion. Denies wheezing or cough.  GI: Denies dysphagia or odynophagia. Denies jaundice, hematemesis, fecal incontinence. GU : Denies urinary burning, urinary frequency, urinary hesitancy MS: Denies joint pain, muscle weakness, cramps, or limitation of movement.  Derm: Denies rash, itching, dry skin Psych: Denies depression, anxiety, memory loss, and confusion Heme: Denies bruising, bleeding, and enlarged lymph nodes.   Physical Exam   BP 115/77   Pulse 82   Temp 98.1 F (36.7 C)   Ht 5' 8"  (1.727 m)   Wt 289 lb 9.6 oz (131.4 kg)   BMI 44.03 kg/m  General:   Alert and oriented. Pleasant and cooperative.  Well-nourished and well-developed.  Head:  Normocephalic and atraumatic. Eyes:  Without icterus Abdomen:  +BS, tense ascites, no TTP, umbilical hernia.  Rectal:  Deferred  Msk:  Symmetrical without gross deformities. Normal posture. Extremities:  Without edema. Neurologic:  Alert and  oriented x4;  grossly normal neurologically. Skin:  Intact without significant lesions or rashes. Psych:  Alert and cooperative. Normal mood and affect.   Assessment   Alejandro Porter is a 46 y.o. 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    Cirrhosis: EGD in interim Oct 2023 as noted above. Not candidate for non-selective beta blocker in light of history of SBP. Will need EGD Oct 2024. MRI recently on file with 3 month surveillance for liver lesions upcoming. Recurrent ascites. Will increase spironolactone to 100 mg BID instead of daily and continue lasix 40 mg BID. Repeat BMP in 1 week.   Ascites: para with fluid analysis.  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 Likely due to miscommunication. Updating doppler ultrasound and will likely need to resume Eliquis.   Encephalopathy: difficulty tolerating lactulose in past due to diarrhea. Will start again 30 ml daily to BID. Call with update. Continue Xifaxan BID.       PLAN    Lasix 40 mg BID, increase spironolactone to 100 mg BID Repeat BMP in 1 week [place] para upcoming MRI in 3 months Doppler ultrasound upcoming Titrating lactluose 4-6 weeks follow-up    Annitta Needs, PhD, ANP-BC [facility]

## 2022-04-13 NOTE — Progress Notes (Signed)
error 

## 2022-04-14 ENCOUNTER — Encounter (HOSPITAL_COMMUNITY): Payer: Self-pay

## 2022-04-14 ENCOUNTER — Ambulatory Visit (HOSPITAL_COMMUNITY)
Admission: RE | Admit: 2022-04-14 | Discharge: 2022-04-14 | Disposition: A | Discharge status: Home / Self Care | Payer: Medicaid Other | Source: Ambulatory Visit | Attending: Gastroenterology | Admitting: Gastroenterology

## 2022-04-14 DIAGNOSIS — K7460 Unspecified cirrhosis of liver: Secondary | ICD-10-CM | POA: Diagnosis present

## 2022-04-14 DIAGNOSIS — R188 Other ascites: Secondary | ICD-10-CM | POA: Insufficient documentation

## 2022-04-14 LAB — BODY FLUID CELL COUNT WITH DIFFERENTIAL
Eos, Fluid: 0 %
Lymphs, Fluid: 35 %
Monocyte-Macrophage-Serous Fluid: 56 % (ref 50–90)
Neutrophil Count, Fluid: 9 % (ref 0–25)
Total Nucleated Cell Count, Fluid: 273 cu mm (ref 0–1000)

## 2022-04-14 NOTE — Procedures (Signed)
PreOperative Dx: Cirrhosis, ascites Postoperative Dx: Cirrhosis, ascites Procedure:   US guided paracentesis Radiologist:  Thornton Papas Anesthesia:  10 ml of1% lidocaine Specimen:  5 L of dark yellow ascitic fluid EBL:   < 1 ml Complications:  None

## 2022-04-14 NOTE — Progress Notes (Signed)
PT tolerated right sided paracentesis procedure well today and 5 Liters of clear yellow ascites removed with labs collected and sent for processing. PT verbalized understanding of discharge instructions and ambulatory at departure with no acute distress noted.

## 2022-04-17 LAB — CYTOLOGY - NON PAP

## 2022-04-19 ENCOUNTER — Ambulatory Visit (HOSPITAL_COMMUNITY)
Admission: RE | Admit: 2022-04-19 | Discharge: 2022-04-19 | Disposition: A | Discharge status: Home / Self Care | Payer: Medicaid Other | Source: Ambulatory Visit | Attending: Gastroenterology | Admitting: Gastroenterology

## 2022-04-19 DIAGNOSIS — I81 Portal vein thrombosis: Secondary | ICD-10-CM | POA: Insufficient documentation

## 2022-04-20 ENCOUNTER — Encounter: Payer: Self-pay | Admitting: Nutrition

## 2022-04-20 ENCOUNTER — Encounter: Payer: Medicaid Other | Attending: Physician Assistant | Admitting: Nutrition

## 2022-04-20 VITALS — Wt 279.0 lb

## 2022-04-20 DIAGNOSIS — R609 Edema, unspecified: Secondary | ICD-10-CM | POA: Insufficient documentation

## 2022-04-20 DIAGNOSIS — R635 Abnormal weight gain: Secondary | ICD-10-CM | POA: Diagnosis present

## 2022-04-20 DIAGNOSIS — K7581 Nonalcoholic steatohepatitis (NASH): Secondary | ICD-10-CM | POA: Diagnosis present

## 2022-04-20 DIAGNOSIS — K7460 Unspecified cirrhosis of liver: Secondary | ICD-10-CM | POA: Insufficient documentation

## 2022-04-20 DIAGNOSIS — E440 Moderate protein-calorie malnutrition: Secondary | ICD-10-CM | POA: Diagnosis present

## 2022-04-20 MED ORDER — APIXABAN 5 MG PO TABS: 5.0000 mg | ORAL_TABLET | Freq: Two times a day (BID) | ORAL | 3 refills | Status: DC

## 2022-04-20 NOTE — Patient Instructions (Addendum)
Goals  Continue to eat whole plant based foods. Choose good quality  protein sources. Avoid canned and processed foods Talk to Roseanne Kaufman  about constipation. Call Dr. Darden Palmer office to schedule appt with a therapist.

## 2022-04-20 NOTE — Progress Notes (Signed)
Medical Nutrition Therapy:  Appt start time: 1050  end time: 1115  Assessment:  Primary concerns today: Cirrhosis of liver, NASH. Protein Calorie Malnutrition. Lives with his dad. Alejandro Porter. GI provider. Can't afford oral nutrition supplements due to lack of income.  14 lbs weight gain since last visit. Had to go to hospital to remove 5 L in the last few weeks-12 lbs of fluid removed he noted. Had been taken off the lactulose, but back on it now. Was given lactulose and miralax-- made him sick. Wears a CPAP at night. Has to get glasses. Can't see things far away. Complains of not remembering things. Has no appetite at times due to SOB. Lives with his Dad.  He wants to see a therapist- Hasn't seen a therapist. Needs to get referral for PCP. Diet remains limited due to appetite and choice of eating more nutrient dense foods due to cost and access. His father doesn't buy a lot of certain things he knows he needs.  He has lost his mom and family members and is depressed about his condition. Feels helpless. Had a liter and 1/2 removed last week of fluid. He notes he feels some better but it hasn't made a big difference. Going to see heart MD the 3rd of next month.  Has cut out eating at the cafe most of the time. Still goes out to eat with his dad at times. Limited food choices. Tries to eat the better food choices. Admits he isn't perfect.  Is aware of high sodium foods to avoid.       Latest Ref Rng & Units 04/05/2022    8:44 AM 03/17/2022    9:50 AM 03/15/2022   10:53 AM  CMP  Glucose 65 - 99 mg/dL 109  145  103   BUN 7 - 25 mg/dL 14  21  21    Creatinine 0.60 - 1.29 mg/dL 1.45  1.44  1.57   Sodium 135 - 146 mmol/L 130  126  125   Potassium 3.5 - 5.3 mmol/L 4.8  5.3  5.6   Chloride 98 - 110 mmol/L 96  91  91   CO2 20 - 32 mmol/L 26  26  26    Calcium 8.6 - 10.3 mg/dL 9.0  9.7  9.8   Total Protein 6.1 - 8.1 g/dL   8.2   Total Bilirubin 0.2 - 1.2 mg/dL   1.0   AST 10 - 40 U/L   42    ALT 9 - 46 U/L   42    Wt Readings from Last 3 Encounters:  04/13/22 289 lb 9.6 oz (131.4 kg)  03/24/22 275 lb (124.7 kg)  03/15/22 269 lb 8 oz (122.2 kg)   Ht Readings from Last 3 Encounters:  04/13/22 5' 8"  (1.727 m)  03/24/22 5' 8"  (1.727 m)  03/15/22 5' 8"  (1.727 m)   There is no height or weight on file to calculate BMI. @BMIFA @ Facility age limit for growth %iles is 20 years. Facility age limit for growth %iles is 20 years.    Preferred Learning Style: Auditory       Visual -not reading. Hands on  Learning Readiness:  Ready Change in progress   MEDICATIONS:   DIETARY INTAKE:  24-hr recall: B ( AM):  Kiwi, watermelon, cantaloupe, grapes- 1-2, Lemonade, L) sandwich-lettuce, tomato and [place], water D) can't remember, water    Usual physical activity: walk some  Estimated energy needs: 1800-2000  calories 225 g carbohydrates 150  g protein  56 g fat  Progress Towards Goal(s):  In progress.   Nutritional Diagnosis:  NB-1.1 Food and nutrition-related knowledge deficit As related to Cirrhosis NASH.  As evidenced by Ascites.    Intervention:  Nutrition Cirrhosis education provided on My Plate, CHO counting, meal planning, portion sizes, timing of meals, Low salt diet, Cirrhosis Nutrition Therapy. Need for daily weights and calling MD with fluid weight gain. Low Potassium foods.  Goal Goals  Continue to eat whole plant based foods Avoid canned and processed foods Talk to Alejandro Porter  about constipation. Call Dr. Darden Palmer office to schedule appt with a therapist.    Teaching Method Utilized:  Visual Auditory Hands on  Handouts given during visit include: The Plate Method  Low Sodium Foods Cirrhosis nutrition   Barriers to learning/adherence to lifestyle change: Cirrhosis.  Demonstrated degree of understanding via:  Teach Back   Monitoring/Evaluation:  Dietary intake, exercise, , and body weight in 3 month(s).

## 2022-04-25 ENCOUNTER — Encounter: Payer: Self-pay | Admitting: Nutrition

## 2022-05-08 ENCOUNTER — Telehealth: Payer: Self-pay | Admitting: Internal Medicine

## 2022-05-08 NOTE — Telephone Encounter (Signed)
Correct. That is an old recall. EGD next in October 2024.

## 2022-05-08 NOTE — Telephone Encounter (Signed)
I am not seeing patient is due for repeat EGD. Pt recently saw Vicente Males. Had EGD in 03/2022. Please advise thanks!

## 2022-05-08 NOTE — Telephone Encounter (Signed)
Needs repeat EGD (18 months) in 06/2022

## 2022-05-11 LAB — BASIC METABOLIC PANEL
BUN/Creatinine Ratio: 14 (calc) (ref 6–22)
BUN: 24 mg/dL (ref 7–25)
CO2: 24 mmol/L (ref 20–32)
Calcium: 9.4 mg/dL (ref 8.6–10.3)
Chloride: 97 mmol/L — ABNORMAL LOW (ref 98–110)
Creat: 1.73 mg/dL — ABNORMAL HIGH (ref 0.60–1.29)
Glucose, Bld: 105 mg/dL — ABNORMAL HIGH (ref 65–99)
Potassium: 4.7 mmol/L (ref 3.5–5.3)
Sodium: 130 mmol/L — ABNORMAL LOW (ref 135–146)

## 2022-05-12 NOTE — Progress Notes (Unsigned)
[name], [place] [address]   CLINIC:  Medical Oncology/Hematology  PCP:  Sofie Rower, PA-C [address] [place]  [address] [phone]   REASON FOR VISIT:  Follow-up for iron deficiency anemia, thrombocytopenia (cirrhosis), and portal vein thrombosis  CURRENT THERAPY: Intermittent IV iron, Eliquis  INTERVAL HISTORY:  [name] 46 y.o. male returns for routine follow-up of his iron deficiency anemia, thrombocytopenia, and portal vein thrombosis.  He was last seen by Tarri Abernethy PA-C on 01/13/2022.  At today's visit, he reports feeling poorly, due to ongoing issues with his gallbladder including symptoms of nausea, diarrhea, and abdominal pain.*** *** He has met with hepatobiliary surgeon at [facility] to see if he would qualify for cholecystectomy, but has been recommended to hold off on cholecystectomy due to significant risk of morbidity and mortality.  He continues to experience severe fatigue, ***although he had some improved energy for about a month and a half after his IV iron (Venofer 300 mg x 3 in August/September 2023).***  Patient has resumed his Eliquis as of ***.  At his last visit, he had told me that his "liver doctor" stopped his Eliquis on 01/04/2022.  However, his gastroenterology NP Roseanne Kaufman) reached out to me in November 2023 to inform me that he was noted to have recurrent nonocclusive portal vein thrombosis.  I had recommended resuming Eliquis, which was done per GI on 04/20/2022.  Regarding his thrombocytopenia and anemia, he denies any bright red blood per rectum, but does admit to blackish watery diarrhea intermittently.  *** *** He admits to easy bruising but denies petechial rash. *** He denies any B symptoms such as fever, chills, night sweats, unintentional weight loss. *** He reports increased fatigue. *** He reports headaches, dizziness, and fatigue. *** No pica, restless legs, chest  pain, dyspnea on exertion, lightheadedness, or syncope.  He has ***% energy and ***% appetite. He endorses that he is maintaining a stable weight.   REVIEW OF SYSTEMS:   *** Review of Systems  Constitutional:  Positive for fatigue. Negative for appetite change, chills, diaphoresis, fever and unexpected weight change.  HENT:   Negative for lump/mass and nosebleeds.   Eyes:  Negative for eye problems.  Respiratory:  Positive for cough and shortness of breath (from abdominal pain and distension). Negative for hemoptysis.   Cardiovascular:  Positive for chest pain (radiation of RUQ abdominal pain to chest). Negative for leg swelling and palpitations.  Gastrointestinal:  Positive for abdominal distention, abdominal pain, diarrhea and nausea. Negative for blood in stool, constipation and vomiting.  Genitourinary:  Negative for hematuria.   Skin: Negative.   Neurological:  Positive for dizziness, headaches and numbness. Negative for light-headedness.  Hematological:  Does not bruise/bleed easily.  Psychiatric/Behavioral:  Positive for sleep disturbance.       PAST MEDICAL/SURGICAL HISTORY:  Past Medical History:  Diagnosis Date   CKD (chronic kidney disease) stage 2, GFR 60-89 ml/min    GERD (gastroesophageal reflux disease)    H/O colonoscopy    H/O endoscopy    Hypertension    Liver cirrhosis (HCC)    Neuropathy    OSA (obstructive sleep apnea)    S/P abdominal paracentesis    Past Surgical History:  Procedure Laterality Date   BIOPSY  09/12/2019   Procedure: BIOPSY;  Surgeon: Daneil Dolin, MD;  [facility];  Service: Endoscopy;;   COLONOSCOPY N/A 09/18/2019   Dr. Oneida Alar: 12 colon polyps removed,  multiple simple adenomas and some inflammatory polyps, diverticulosis, external and internal hemorrhoids.  Next colonoscopy in 3 years.   COLONOSCOPY WITH PROPOFOL N/A 03/24/2022   Procedure: COLONOSCOPY WITH PROPOFOL;  Surgeon: Eloise Harman, DO;  [facility];  Service: Endoscopy;  Laterality: N/A;  1:30pm, asa 3   ESOPHAGEAL BANDING N/A 09/12/2019   Procedure: ESOPHAGEAL BANDING;  Surgeon: Daneil Dolin, MD;  [facility];  Service: Endoscopy;  Laterality: N/A;   ESOPHAGOGASTRODUODENOSCOPY N/A 09/12/2019   Dr. Gala Romney: Mild erosive reflux esophagitis.  Schatzki ring status post dilation.  Small grade 1/grade 2 esophageal varices, portal hypertensive gastropathy, hyperplastic gastric polyp, gastric erosions with chronic inactive gastritis on biopsies, no H. pylori.   ESOPHAGOGASTRODUODENOSCOPY (EGD) WITH PROPOFOL N/A 12/16/2020   two short columns of no more than Grade 2 esophageal varices, appearing innocent. Grade 1 varices not apparent today. Multiple large posterior body and antral hyperplastic, hemorrhagic appearing polyps, larges approximately 2.5 cm pedunculated. Portal gastropathy. Normal duodenum. Polypectomy with clips placed. EGD in 18 months.   ESOPHAGOGASTRODUODENOSCOPY (EGD) WITH PROPOFOL N/A 03/24/2022   Procedure: ESOPHAGOGASTRODUODENOSCOPY (EGD) WITH PROPOFOL;  Surgeon: Eloise Harman, DO;  [facility];  Service: Endoscopy;  Laterality: N/A;   POLYPECTOMY  09/18/2019   Procedure: POLYPECTOMY;  Surgeon: Danie Binder, MD;  [facility];  Service: Endoscopy;;   POLYPECTOMY  12/16/2020   Procedure: POLYPECTOMY;  Surgeon: Daneil Dolin, MD;  [facility];  Service: Endoscopy;;  gastric   POLYPECTOMY  03/24/2022   Procedure: POLYPECTOMY;  Surgeon: Eloise Harman, DO;  [facility];  Service: Endoscopy;;     SOCIAL HISTORY:  Social History   Socioeconomic History   Marital status: Single    Spouse name: Not on file   Number of children: Not on file   Years of education: Not on file   Highest education level: Not on file  Occupational History   Occupation: umemployed  Tobacco Use   Smoking status: Never    Passive exposure: Current   Smokeless tobacco: Never   Vaping Use   Vaping Use: Never used  Substance and Sexual Activity   Alcohol use: Never   Drug use: Never   Sexual activity: Not Currently  Other Topics Concern   Not on file  Social History Narrative   Not on file   Social Determinants of Health   Financial Resource Strain: Not on file  Food Insecurity: Not on file  Transportation Needs: Not on file  Physical Activity: Not on file  Stress: Not on file  Social Connections: Not on file  Intimate Partner Violence: Not on file    FAMILY HISTORY:  Family History  Problem Relation Age of Onset   Brain cancer Mother    Diabetes Mother    Diabetes Father    Pulmonary embolism Brother    Liver disease Neg Hx     CURRENT MEDICATIONS:  Outpatient Encounter Medications as of 05/15/2022  Medication Sig   apixaban (ELIQUIS) 5 MG TABS tablet Take 1 tablet (5 mg total) by mouth 2 (two) times daily.   Cholecalciferol (VITAMIN D3) 125 MCG (5000 UT) TABS Take 5,000 Units by mouth in the morning.   ciprofloxacin (CIPRO) 500 MG tablet TAKE 1 TABLET BY MOUTH DAILY   cyclobenzaprine (FLEXERIL) 5 MG tablet Take 5 mg by mouth 3 (three) times daily as needed for muscle spasms.   ferrous sulfate 325 (65 FE) MG EC tablet Take 325  mg by mouth in the morning and at bedtime.   furosemide (LASIX) 40 MG tablet Take 40 mg by mouth 2 (two) times daily.   gabapentin (NEURONTIN) 300 MG capsule Take 300 mg by mouth 3 (three) times daily as needed (pain.).   lactulose (CHRONULAC) 10 GM/15ML solution Take 30 mLs (20 g total) by mouth 2 (two) times daily.   levOCARNitine (CARNITOR) 330 MG tablet Take 330 mg by mouth 3 (three) times daily.   Multiple Vitamin (MULTIVITAMIN) tablet Take 1 tablet by mouth in the morning.   omeprazole (PRILOSEC) 20 MG capsule Take 1 capsule (20 mg total) by mouth daily. (Patient not taking: Reported on 03/16/2022)   ondansetron (ZOFRAN) 4 MG tablet TAKE 1 TABLET BY MOUTH EVERY 6 HOURS AS NEEDED FOR NAUSEA OR FOR VOMITING    pantoprazole (PROTONIX) 40 MG tablet Take 1 tablet (40 mg total) by mouth daily. 30 minutes before breakfast. Take instead of omeprazole   polyethylene glycol (MIRALAX / GLYCOLAX) 17 g packet Take 17 g by mouth 2 (two) times daily with a meal. (Breakfast & supper)   spironolactone (ALDACTONE) 100 MG tablet Take 100 mg by mouth 2 (two) times daily.   XIFAXAN 550 MG TABS tablet TAKE 1 TABLET BY MOUTH TWICE DAILY   Zinc 50 MG TABS Take 50 mg by mouth in the morning.   No facility-administered encounter medications on file as of 05/15/2022.    ALLERGIES:  Allergies  Allergen Reactions   Chlorthalidone     Severe headaches    Metoprolol     Severe headaches      PHYSICAL EXAM:   *** ECOG PERFORMANCE STATUS: 1 - Symptomatic but completely ambulatory  There were no vitals filed for this visit. There were no vitals filed for this visit. Physical Exam Constitutional:      Appearance: Normal appearance. He is obese.  HENT:     Head: Normocephalic and atraumatic.     Mouth/Throat:     Mouth: Mucous membranes are moist.  Eyes:     Extraocular Movements: Extraocular movements intact.     Pupils: Pupils are equal, round, and reactive to light.  Cardiovascular:     Rate and Rhythm: Normal rate and regular rhythm.     Pulses: Normal pulses.     Heart sounds: Normal heart sounds.  Pulmonary:     Effort: Pulmonary effort is normal.     Breath sounds: Normal breath sounds.  Abdominal:     General: Bowel sounds are normal. There is distension (moderately distended).     Palpations: Abdomen is soft.     Tenderness: There is no abdominal tenderness.  Musculoskeletal:        General: No swelling.     Right lower leg: No edema.     Left lower leg: No edema.  Lymphadenopathy:     Cervical: No cervical adenopathy.  Skin:    General: Skin is warm and dry.  Neurological:     General: No focal deficit present.     Mental Status: He is alert and oriented to person, place, and time.   Psychiatric:        Mood and Affect: Mood normal.        Behavior: Behavior normal.     LABORATORY DATA:  I have reviewed the labs as listed.  CBC    Component Value Date/Time   WBC 8.8 03/15/2022 1053   RBC 4.19 (L) 03/15/2022 1053   HGB 13.1 (L) 03/15/2022 1053   HCT  37.4 (L) 03/15/2022 1053   PLT 105 (L) 03/15/2022 1053   MCV 89.3 03/15/2022 1053   MCH 31.3 03/15/2022 1053   MCHC 35.0 03/15/2022 1053   RDW 13.0 03/15/2022 1053   LYMPHSABS 361 (L) 03/15/2022 1053   MONOABS 0.6 01/13/2022 0803   EOSABS 123 03/15/2022 1053   BASOSABS 18 03/15/2022 1053      Latest Ref Rng & Units 05/10/2022   11:05 AM 04/05/2022    8:44 AM 03/17/2022    9:50 AM  CMP  Glucose 65 - 99 mg/dL 105  109  145   BUN 7 - 25 mg/dL _0 Creatinine 0.60 - 1.29 mg/dL 1.73  1.45  1.44   Sodium 135 - 146 mmol/L 130  130  126   Potassium 3.5 - 5.3 mmol/L 4.7  4.8  5.3   Chloride 98 - 110 mmol/L 97  96  91   CO2 20 - 32 mmol/L _1 Calcium 8.6 - 10.3 mg/dL 9.4  9.0  9.7     DIAGNOSTIC IMAGING:  I have independently reviewed the relevant imaging and discussed with the patient.  ASSESSMENT & PLAN: 1.  Iron deficiency anemia - He previously took iron pills which caused constipation. - Most recent IV Venofer 300 mg x 3 from 01/19/2022 through 02/03/2022 - EGD (03/24/2022): Small esophageal varices, portal hypertensive gastropathy, multiple gastric polyps, duodenitis. - Colonoscopy (03/24/2022): Nonbleeding internal hemorrhoids.  Rectal varices. - No bright red blood per rectum.  Intermittent blackish watery diarrhea.*** - Reports increased fatigue*** - Most recent labs (05/15/2022): *** - PLAN: *** IV iron *** - Repeat labs and RTC in 4 months     2.  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 - SPEP was negative.  B12, methylmalonic acid, folic acid were normal.  Hepatitis B  was negative.  H. pylori  was negative. - Abdominal ultrasound (06/21/2021): Liver cirrhosis and splenomegaly (estimated splenic volume 1100 mL) - Abdominal ultrasound (11/29/2021): Interval increase in splenomegaly measuring 20.6 cm in length with estimated volume 1943 cc -Patient denies any major bleeding, bruising, petechial rash*** - Thrombocytopenia likely secondary to cirrhosis and splenomegaly*** - Most recent labs (05/15/2022): *** - PLAN: No indication for treatment at this time.  We will continue to monitor.  We will consider sending for splenic artery embolization if platelets drop below 30.***  3.  Portal vein thrombosis  - Ultrasound Doppler of the liver on 09/11/2019 showed nonocclusive hyperechoic thrombus along the main portal vein wall posteriorly extending into the right portal vein. - Patient had stopped Eliquis in August 2023, reportedly at the instruction of his "liver doctor," but this may have been a misunderstanding on his part. - Patient has resumed his Eliquis as of ***.  At his last visit, he had told me that his "liver doctor" stopped his Eliquis on 01/04/2022.  However, his gastroenterology NP Roseanne Kaufman) reached out to me in November 2023 to inform me that he was noted to have recurrent nonocclusive portal vein thrombosis.  I had recommended resuming Eliquis, which was done per GI on 04/20/2022. - PLAN: Continue Eliquis indefinitely.  ***  4.  Liver cirrhosis and ascites - CTAP from [facility] on 08/17/2019 showed liver morphology consistent with cirrhosis with no masses.  Enlarged spleen measuring 22 cm.  Nonocclusive thrombus in the portal vein.  Prominent to mildly enlarged periceliac and gastrohepatic ligament  lymph nodes.  3.5 cm left adrenal mass consistent with adenoma. - Abdominal ultrasound (06/21/2021): Liver cirrhosis and splenomegaly (estimated splenic volume 1100 mL) - He follows with gastroenterology and hepatology - He has significant pain from biliary dyskinesia,  considered high risk surgical candidate.  He has met with hepatobiliary surgeon at [facility] in [place] to see if he would qualify for cholecystectomy, but has been recommended to hold off on cholecystectomy due to significant risk of morbidity and mortality.   PLAN SUMMARY: >> *** >> *** >> ***   All questions were answered. The patient knows to call the clinic with any problems, questions or concerns.  Medical decision making: Moderate    Time spent on visit: I spent 20 minutes counseling the patient face to face. The total time spent in the appointment was 30 minutes and more than 50% was on counseling.   Harriett Rush, PA-C  01/13/2022 9:59 AM

## 2022-05-15 ENCOUNTER — Other Ambulatory Visit: Payer: Self-pay

## 2022-05-15 ENCOUNTER — Inpatient Hospital Stay: Payer: Medicaid Other | Attending: Hematology | Admitting: Physician Assistant

## 2022-05-15 ENCOUNTER — Inpatient Hospital Stay: Payer: Medicaid Other

## 2022-05-15 VITALS — BP 125/83 | HR 17 | Temp 98.3°F | Ht 68.0 in | Wt 261.4 lb

## 2022-05-15 DIAGNOSIS — D500 Iron deficiency anemia secondary to blood loss (chronic): Secondary | ICD-10-CM | POA: Diagnosis not present

## 2022-05-15 DIAGNOSIS — N182 Chronic kidney disease, stage 2 (mild): Secondary | ICD-10-CM | POA: Insufficient documentation

## 2022-05-15 DIAGNOSIS — D509 Iron deficiency anemia, unspecified: Secondary | ICD-10-CM | POA: Diagnosis present

## 2022-05-15 DIAGNOSIS — Z79899 Other long term (current) drug therapy: Secondary | ICD-10-CM | POA: Insufficient documentation

## 2022-05-15 DIAGNOSIS — D696 Thrombocytopenia, unspecified: Secondary | ICD-10-CM

## 2022-05-15 DIAGNOSIS — Z7901 Long term (current) use of anticoagulants: Secondary | ICD-10-CM | POA: Insufficient documentation

## 2022-05-15 DIAGNOSIS — D508 Other iron deficiency anemias: Secondary | ICD-10-CM

## 2022-05-15 DIAGNOSIS — Z86718 Personal history of other venous thrombosis and embolism: Secondary | ICD-10-CM | POA: Diagnosis not present

## 2022-05-15 DIAGNOSIS — D631 Anemia in chronic kidney disease: Secondary | ICD-10-CM | POA: Diagnosis present

## 2022-05-15 LAB — CBC WITH DIFFERENTIAL/PLATELET
Abs Immature Granulocytes: 0.02 10*3/uL (ref 0.00–0.07)
Basophils Absolute: 0 10*3/uL (ref 0.0–0.1)
Basophils Relative: 0 %
Eosinophils Absolute: 0.2 10*3/uL (ref 0.0–0.5)
Eosinophils Relative: 3 %
HCT: 36.5 % — ABNORMAL LOW (ref 39.0–52.0)
Hemoglobin: 12.5 g/dL — ABNORMAL LOW (ref 13.0–17.0)
Immature Granulocytes: 0 %
Lymphocytes Relative: 7 %
Lymphs Abs: 0.4 10*3/uL — ABNORMAL LOW (ref 0.7–4.0)
MCH: 31.6 pg (ref 26.0–34.0)
MCHC: 34.2 g/dL (ref 30.0–36.0)
MCV: 92.4 fL (ref 80.0–100.0)
Monocytes Absolute: 0.7 10*3/uL (ref 0.1–1.0)
Monocytes Relative: 11 %
Neutro Abs: 4.7 10*3/uL (ref 1.7–7.7)
Neutrophils Relative %: 79 %
Platelets: 82 10*3/uL — ABNORMAL LOW (ref 150–400)
RBC: 3.95 MIL/uL — ABNORMAL LOW (ref 4.22–5.81)
RDW: 14.5 % (ref 11.5–15.5)
WBC: 6 10*3/uL (ref 4.0–10.5)
nRBC: 0 % (ref 0.0–0.2)

## 2022-05-15 LAB — COMPREHENSIVE METABOLIC PANEL
ALT: 47 U/L — ABNORMAL HIGH (ref 0–44)
AST: 51 U/L — ABNORMAL HIGH (ref 15–41)
Albumin: 3.8 g/dL (ref 3.5–5.0)
Alkaline Phosphatase: 127 U/L — ABNORMAL HIGH (ref 38–126)
Anion gap: 10 (ref 5–15)
BUN: 27 mg/dL — ABNORMAL HIGH (ref 6–20)
CO2: 22 mmol/L (ref 22–32)
Calcium: 9.2 mg/dL (ref 8.9–10.3)
Chloride: 94 mmol/L — ABNORMAL LOW (ref 98–111)
Creatinine, Ser: 1.64 mg/dL — ABNORMAL HIGH (ref 0.61–1.24)
GFR, Estimated: 52 mL/min — ABNORMAL LOW (ref >60)
Glucose, Bld: 116 mg/dL — ABNORMAL HIGH (ref 70–99)
Potassium: 4.6 mmol/L (ref 3.5–5.1)
Sodium: 126 mmol/L — ABNORMAL LOW (ref 135–145)
Total Bilirubin: 1 mg/dL (ref 0.3–1.2)
Total Protein: 8.2 g/dL — ABNORMAL HIGH (ref 6.5–8.1)

## 2022-05-15 LAB — IRON AND TIBC
Iron: 80 ug/dL (ref 45–182)
Saturation Ratios: 24 % (ref 17.9–39.5)
TIBC: 336 ug/dL (ref 250–450)
UIBC: 256 ug/dL

## 2022-05-15 LAB — FERRITIN: Ferritin: 138 ng/mL (ref 24–336)

## 2022-05-15 NOTE — Patient Instructions (Signed)
Alejandro Porter at [facility] Discharge Instructions  You were seen today by Tarri Abernethy PA-C for your iron deficiency anemia, low platelets, and portal vein thrombosis (blood clot in your liver).  IRON DEFICIENCY ANEMIA: Your blood and iron levels are adequate.  You do not need any IV iron at this time.  LOW PLATELETS: You have low platelets because of your liver cirrhosis.  They are mildly low, but stable at your usual baseline.  You do not need treatment for that at this time.  PORTAL VEIN THROMBOSIS (blood clot in your liver): Continue Eliquis daily.  LABS: Return in 4 months for repeat labs  FOLLOW-UP APPOINTMENT: Office visit after labs   - - - - - - - - - - - - - - - - - -   Thank you for choosing [name] at [facility] to provide your oncology and hematology care.  To afford each patient quality time with our provider, please arrive at least 15 minutes before your scheduled appointment time.   If you have a lab appointment with the [place] please come in thru the Main Entrance and check in at the main information desk.  You need to re-schedule your appointment should you arrive 10 or more minutes late.  We strive to give you quality time with our providers, and arriving late affects you and other patients whose appointments are after yours.  Also, if you no show three or more times for appointments you may be dismissed from the clinic at the providers discretion.     Again, thank you for choosing [facility]  Our hope is that these requests will decrease the amount of time that you wait before being seen by our physicians.       _____________________________________________________________  Should you have questions after your visit to [facility], please contact our office at [phone] and follow the prompts.  Our office hours are 8:00 a.m. and 4:30 p.m. Monday - Friday.  Please note that  voicemails left after 4:00 p.m. may not be returned until the following business day.  We are closed weekends and major holidays.  You do have access to a nurse 24-7, just call the main number to the clinic [phone] and do not press any options, hold on the line and a nurse will answer the phone.    For prescription refill requests, have your pharmacy contact our office and allow 72 hours.    Due to Covid, you will need to wear a mask upon entering the hospital. If you do not have a mask, a mask will be given to you at the Main Entrance upon arrival. For doctor visits, patients may have 1 support person age 65 or older with them. For treatment visits, patients can not have anyone with them due to social distancing guidelines and our immunocompromised population.

## 2022-05-16 ENCOUNTER — Other Ambulatory Visit: Payer: Self-pay

## 2022-05-16 ENCOUNTER — Ambulatory Visit: Payer: Medicaid Other | Admitting: Gastroenterology

## 2022-05-16 DIAGNOSIS — K7682 Hepatic encephalopathy: Secondary | ICD-10-CM

## 2022-05-16 DIAGNOSIS — K7460 Unspecified cirrhosis of liver: Secondary | ICD-10-CM

## 2022-05-16 DIAGNOSIS — R188 Other ascites: Secondary | ICD-10-CM

## 2022-05-23 LAB — BASIC METABOLIC PANEL
BUN/Creatinine Ratio: 14 (ref 9–20)
BUN: 24 mg/dL (ref 6–24)
CO2: 19 mmol/L — ABNORMAL LOW (ref 20–29)
Calcium: 9.3 mg/dL (ref 8.7–10.2)
Chloride: 92 mmol/L — ABNORMAL LOW (ref 96–106)
Creatinine, Ser: 1.68 mg/dL — ABNORMAL HIGH (ref 0.76–1.27)
Glucose: 83 mg/dL (ref 70–99)
Potassium: 5.3 mmol/L — ABNORMAL HIGH (ref 3.5–5.2)
Sodium: 127 mmol/L — ABNORMAL LOW (ref 134–144)
eGFR: 50 mL/min/{1.73_m2} — ABNORMAL LOW (ref >59)

## 2022-05-24 ENCOUNTER — Other Ambulatory Visit: Payer: Self-pay

## 2022-05-24 ENCOUNTER — Other Ambulatory Visit: Payer: Self-pay | Admitting: Gastroenterology

## 2022-05-24 DIAGNOSIS — R188 Other ascites: Secondary | ICD-10-CM

## 2022-05-24 DIAGNOSIS — N182 Chronic kidney disease, stage 2 (mild): Secondary | ICD-10-CM

## 2022-05-24 HISTORY — DX: Chronic kidney disease, stage 2 (mild): N18.2

## 2022-05-25 ENCOUNTER — Ambulatory Visit: Payer: Medicaid Other | Admitting: Gastroenterology

## 2022-06-01 LAB — BASIC METABOLIC PANEL
BUN/Creatinine Ratio: 14 (calc) (ref 6–22)
BUN: 21 mg/dL (ref 7–25)
CO2: 23 mmol/L (ref 20–32)
Calcium: 9.2 mg/dL (ref 8.6–10.3)
Chloride: 94 mmol/L — ABNORMAL LOW (ref 98–110)
Creat: 1.46 mg/dL — ABNORMAL HIGH (ref 0.60–1.29)
Glucose, Bld: 99 mg/dL (ref 65–99)
Potassium: 5.2 mmol/L (ref 3.5–5.3)
Sodium: 126 mmol/L — ABNORMAL LOW (ref 135–146)

## 2022-06-05 DIAGNOSIS — F32A Depression, unspecified: Secondary | ICD-10-CM

## 2022-06-05 HISTORY — DX: Depression, unspecified: F32.A

## 2022-06-07 ENCOUNTER — Other Ambulatory Visit: Payer: Self-pay | Admitting: *Deleted

## 2022-06-07 ENCOUNTER — Other Ambulatory Visit: Payer: Self-pay | Admitting: Gastroenterology

## 2022-06-07 DIAGNOSIS — K7460 Unspecified cirrhosis of liver: Secondary | ICD-10-CM

## 2022-06-09 ENCOUNTER — Encounter (HOSPITAL_COMMUNITY): Payer: Self-pay

## 2022-06-09 ENCOUNTER — Ambulatory Visit (HOSPITAL_COMMUNITY)
Admission: RE | Admit: 2022-06-09 | Discharge: 2022-06-09 | Disposition: A | Discharge status: Home / Self Care | Payer: Medicaid Other | Source: Ambulatory Visit | Attending: Gastroenterology | Admitting: Gastroenterology

## 2022-06-09 DIAGNOSIS — K7460 Unspecified cirrhosis of liver: Secondary | ICD-10-CM | POA: Insufficient documentation

## 2022-06-09 DIAGNOSIS — R188 Other ascites: Secondary | ICD-10-CM | POA: Insufficient documentation

## 2022-06-09 LAB — BODY FLUID CELL COUNT WITH DIFFERENTIAL
Eos, Fluid: 0 %
Lymphs, Fluid: 33 %
Monocyte-Macrophage-Serous Fluid: 61 % (ref 50–90)
Neutrophil Count, Fluid: 6 % (ref 0–25)
Other Cells, Fluid: REACTIVE %
Total Nucleated Cell Count, Fluid: 453 cu mm (ref 0–1000)

## 2022-06-09 MED ORDER — ALBUMIN HUMAN 25 % IV SOLN
0.0000 g | Freq: Once | INTRAVENOUS | Status: DC
  Filled 2022-06-09: qty 400

## 2022-06-09 NOTE — Procedures (Signed)
PreOperative Dx: Cirrhosis, ascites Postoperative Dx: Cirrhosis, ascites Procedure:   US guided paracentesis Radiologist:  Thornton Papas Anesthesia:  10 ml of1% lidocaine Specimen:  4 L of serosanguinous ascitic fluid EBL:   < 1 ml Complications:  None

## 2022-06-09 NOTE — Progress Notes (Signed)
Patient tolerated right sided paracentesis procedure well today and 4 Liters of serosanguinous ascites removed with labs collected and sent for processing. PT verbalized understanding of discharge instructions and ambulatory at departure with no acute distress noted.

## 2022-06-13 ENCOUNTER — Telehealth: Payer: Self-pay | Admitting: Internal Medicine

## 2022-06-13 NOTE — Telephone Encounter (Signed)
Recall for Feb. - Repeat MRI and AFP

## 2022-06-14 ENCOUNTER — Encounter: Payer: Self-pay | Admitting: Internal Medicine

## 2022-06-14 LAB — PATHOLOGIST SMEAR REVIEW

## 2022-06-15 ENCOUNTER — Other Ambulatory Visit: Payer: Self-pay | Admitting: Gastroenterology

## 2022-06-15 ENCOUNTER — Encounter: Payer: Self-pay | Admitting: Gastroenterology

## 2022-06-15 ENCOUNTER — Ambulatory Visit: Payer: Medicaid Other | Admitting: Gastroenterology

## 2022-06-15 ENCOUNTER — Other Ambulatory Visit: Payer: Self-pay | Admitting: *Deleted

## 2022-06-15 VITALS — BP 115/78 | HR 85 | Temp 98.0°F | Ht 68.0 in | Wt 283.6 lb

## 2022-06-15 DIAGNOSIS — I81 Portal vein thrombosis: Secondary | ICD-10-CM | POA: Diagnosis not present

## 2022-06-15 DIAGNOSIS — R188 Other ascites: Secondary | ICD-10-CM

## 2022-06-15 DIAGNOSIS — K7460 Unspecified cirrhosis of liver: Secondary | ICD-10-CM | POA: Diagnosis not present

## 2022-06-15 NOTE — Patient Instructions (Signed)
Decrease the lactulose to 15 milliliters a day. We need to titrate this so you have 3 soft bowel movements daily.   We are arranging another paracentesis. Please don't take your fluid pills on the day that you have this (lasix and spironolactone).   I am arranging standing orders for a para (up to 3). On the days you have those, please do not take lasix or spironolactone. Please let us know when you go to have this done.  We will see you in 3 months!  I enjoyed seeing you again today! As you know, I value our relationship and want to provide genuine, compassionate, and quality care. I welcome your feedback. If you receive a survey regarding your visit,  I greatly appreciate you taking time to fill this out. See you next time!  Annitta Needs, PhD, ANP-BC [facility]

## 2022-06-15 NOTE — Progress Notes (Signed)
Gastroenterology Office Note     Primary Care Physician:  Sofie Rower, PA-C  Primary Gastroenterologist: Dr. Gala Romney    Chief Complaint   Chief Complaint  Patient presents with   Follow-up    Retaining fluid again     History of Present Illness   Alejandro Porter is a 47 y.o. male presenting today in follow-up with a history of cirrhosis complicated by portal hypertension, esophageal varices, portal vein thrombosis on Eliquis, hepatic encephalopathy on lactulose and Xfiaxan, ascites requiring repeat paras, history of SBP in April 2022, IDA followed by Hematology, biliary dyskinesia but not a surgical candidate, and undergoing liver transplant eval at [facility].    EGD Oct 2023; small esophageal varices, portal gastropathy, multiple gastric polyps s/p resection and retrieval (hyperplastic) duodenitis. 1 year surveillance due.    Colonoscopy Oct 2023: fair colon prep, non-bleeding internal hemorrhoids and rectal varices. Stool in colon. 1 year surveillance due to poor prep.   Liver lesions on MRI, normal AFP tumor marker, needs repeat MRI in Feb 2024. He is on the recall list.    He has no support person for transplant candidacy. Has taken Hep A/B vaccinations, flu, TDAP.     Lasix 40 BID and  spironolactone 100 mg daily. Increased titration of diuretics has led to bumps in creatinine in the past. Recent para 1/5. Max of 4 liters requested.    Constulose 30 ml BID. Multiple BMs. As long as taking constulose, he will have BMs. Hard concentrating at times when doing things. Appt on 1/24 at 9am with therapist.  Denies overt GI bleeding.     Past Medical History:  Diagnosis Date   CKD (chronic kidney disease) stage 2, GFR 60-89 ml/min    GERD (gastroesophageal reflux disease)    H/O colonoscopy    H/O endoscopy    Hypertension    Liver cirrhosis (HCC)    Neuropathy    OSA (obstructive sleep apnea)    S/P abdominal paracentesis     Past Surgical  History:  Procedure Laterality Date   BIOPSY  09/12/2019   Procedure: BIOPSY;  Surgeon: Daneil Dolin, MD;  [facility]: [facility];  Service: Endoscopy;;   COLONOSCOPY N/A 09/18/2019   Dr. Oneida Alar: 12 colon polyps removed, multiple simple adenomas and some inflammatory polyps, diverticulosis, external and internal hemorrhoids.  Next colonoscopy in 3 years.   COLONOSCOPY WITH PROPOFOL N/A 03/24/2022   Procedure: COLONOSCOPY WITH PROPOFOL;  Surgeon: Eloise Harman, DO;  [facility]: [facility];  Service: Endoscopy;  Laterality: N/A;  1:30pm, asa 3   ESOPHAGEAL BANDING N/A 09/12/2019   Procedure: ESOPHAGEAL BANDING;  Surgeon: Daneil Dolin, MD;  [facility];  Service: Endoscopy;  Laterality: N/A;   ESOPHAGOGASTRODUODENOSCOPY N/A 09/12/2019   Dr. Gala Romney: Mild erosive reflux esophagitis.  Schatzki ring status post dilation.  Small grade 1/grade 2 esophageal varices, portal hypertensive gastropathy, hyperplastic gastric polyp, gastric erosions with chronic inactive gastritis on biopsies, no H. pylori.   ESOPHAGOGASTRODUODENOSCOPY (EGD) WITH PROPOFOL N/A 12/16/2020   two short columns of no more than Grade 2 esophageal varices, appearing innocent. Grade 1 varices not apparent today. Multiple large posterior body and antral hyperplastic, hemorrhagic appearing polyps, larges approximately 2.5 cm pedunculated. Portal gastropathy. Normal duodenum. Polypectomy with clips placed. EGD in 18 months.   ESOPHAGOGASTRODUODENOSCOPY (EGD) WITH PROPOFOL N/A 03/24/2022   Procedure: ESOPHAGOGASTRODUODENOSCOPY (EGD) WITH PROPOFOL;  Surgeon: Eloise Harman, DO;  [facility];  Service:  Endoscopy;  Laterality: N/A;   POLYPECTOMY  09/18/2019   Procedure: POLYPECTOMY;  Surgeon: Danie Binder, MD;  [facility];  Service: Endoscopy;;   POLYPECTOMY  12/16/2020   Procedure: POLYPECTOMY;  Surgeon: Daneil Dolin, MD;  [facility];  Service: Endoscopy;;  gastric    POLYPECTOMY  03/24/2022   Procedure: POLYPECTOMY;  Surgeon: Eloise Harman, DO;  [facility];  Service: Endoscopy;;    Current Outpatient Medications  Medication Sig Dispense Refill   apixaban (ELIQUIS) 5 MG TABS tablet Take 1 tablet (5 mg total) by mouth 2 (two) times daily. 60 tablet 3   Cholecalciferol (VITAMIN D3) 125 MCG (5000 UT) TABS Take 5,000 Units by mouth in the morning.     ciprofloxacin (CIPRO) 500 MG tablet TAKE 1 TABLET BY MOUTH DAILY 30 tablet 5   CONSTULOSE 10 GM/15ML solution take 30 mls BY MOUTH TWICE DAILY 473 mL 3   cyclobenzaprine (FLEXERIL) 5 MG tablet Take 5 mg by mouth 3 (three) times daily as needed for muscle spasms.     DULoxetine (CYMBALTA) 60 MG capsule Take 60 mg by mouth daily.     ferrous sulfate 325 (65 FE) MG EC tablet Take 325 mg by mouth in the morning and at bedtime.     furosemide (LASIX) 40 MG tablet Take 40 mg by mouth 2 (two) times daily.     gabapentin (NEURONTIN) 300 MG capsule Take 300 mg by mouth 3 (three) times daily as needed (pain.).     levOCARNitine (CARNITOR) 330 MG tablet Take 330 mg by mouth 3 (three) times daily.     Multiple Vitamin (MULTIVITAMIN) tablet Take 1 tablet by mouth in the morning.     ondansetron (ZOFRAN) 4 MG tablet TAKE 1 TABLET BY MOUTH EVERY 6 HOURS AS NEEDED FOR NAUSEA OR FOR VOMITING 30 tablet 3   pantoprazole (PROTONIX) 40 MG tablet Take 1 tablet (40 mg total) by mouth daily. 30 minutes before breakfast. Take instead of omeprazole 90 tablet 3   polyethylene glycol (MIRALAX / GLYCOLAX) 17 g packet Take 17 g by mouth 2 (two) times daily with a meal. (Breakfast & supper)     spironolactone (ALDACTONE) 100 MG tablet Take 100 mg by mouth daily.     XIFAXAN 550 MG TABS tablet TAKE 1 TABLET BY MOUTH TWICE DAILY 180 tablet 3   Zinc 50 MG TABS Take 50 mg by mouth in the morning.     No current facility-administered medications for this visit.    Allergies as of 06/15/2022 - Review Complete 06/15/2022   Allergen Reaction Noted   Chlorthalidone  11/04/2019   Metoprolol  11/04/2019    Family History  Problem Relation Age of Onset   Brain cancer Mother    Diabetes Mother    Diabetes Father    Pulmonary embolism Brother    Liver disease Neg Hx     Social History   Socioeconomic History   Marital status: Single    Spouse name: Not on file   Number of children: Not on file   Years of education: Not on file   Highest education level: Not on file  Occupational History   Occupation: umemployed  Tobacco Use   Smoking status: Never    Passive exposure: Current   Smokeless tobacco: Never  Vaping Use   Vaping Use: Never used  Substance and Sexual Activity   Alcohol use: Never   Drug use: Never   Sexual activity: Not Currently  Other Topics Concern   Not on file  Social History Narrative   Not on file   Social Determinants of Health   Financial Resource Strain: Not on file  Food Insecurity: Not on file  Transportation Needs: Not on file  Physical Activity: Not on file  Stress: Not on file  Social Connections: Not on file  Intimate Partner Violence: Not on file     Review of Systems   Gen: Denies any fever, chills, fatigue, weight loss, lack of appetite.  CV: Denies chest pain, heart palpitations, peripheral edema, syncope.  Resp: Denies shortness of breath at rest or with exertion. Denies wheezing or cough.  GI: Denies dysphagia or odynophagia. Denies jaundice, hematemesis, fecal incontinence. GU : Denies urinary burning, urinary frequency, urinary hesitancy MS: Denies joint pain, muscle weakness, cramps, or limitation of movement.  Derm: Denies rash, itching, dry skin Psych: Denies depression, anxiety, memory loss, and confusion Heme: Denies bruising, bleeding, and enlarged lymph nodes.   Physical Exam   BP 115/78 (BP Location: Right Arm, Patient Position: Sitting, Cuff Size: Large)   Pulse 85   Temp 98 F (36.7 C) (Oral)   Ht '5\' 8"'$  (1.727 m)   Wt 283 lb  9.6 oz (128.6 kg)   SpO2 99%   BMI 43.12 kg/m  General:   Alert and oriented. Pleasant and cooperative. Well-nourished and well-developed.  Head:  Normocephalic and atraumatic. Eyes:  Without icterus Abdomen:  +BS, obese, tense ascites, protruding umbilical hernia Rectal:  Deferred  Msk:  Symmetrical without gross deformities. Normal posture. Extremities:  With pedal and pre-tibial mild edema  Neurologic:  Alert and  oriented x4;  grossly normal neurologically. Skin:  Intact without significant lesions or rashes. Psych:  Alert and cooperative. Normal mood and affect.  Outside labs dated 05/22/2022:  Hgb 12.3, Platelets 102, Tbili 1.0, Alk Phos 164, AST 51, ALT 48, INR 1.1., AFP <1.8, creatinine 1.77,    Assessment   Alejandro Porter is a 47 y.o. male presenting today in follow-up with a history of cirrhosis complicated by portal hypertension, esophageal varices, portal vein thrombosis on Eliquis, hepatic encephalopathy on lactulose and Xfiaxan, ascites requiring repeat paras, history of SBP in April 2022, IDA followed by Hematology, biliary dyskinesia but not a surgical candidate, and undergoing liver [facility] eval [facility].   Cirrhosis: MELD 3.0 is 22 as of Dec 2023. EGD due in October 2024, with history of small varices. Liver lesions on MRI But normal AFP, with recall MRI due Feb 2024. Continues to have recurrent ascites in setting of difficulty titrating diuretics due to renal function and electrolytes. He is not an appropriate TIPS candidate with elevated MELD, hx of encephalopathy, and partially occlusive PVT. Para to be scheduled.   PVT: continue Eliquis. Discussed increased risks of bleeding. Continue to follow H/H and clinically.   Hx of SBP: continue Cipro daily  History of encephalopathy: difficulty titrating lactulose due to diarrhea. Decrease to 15 ml daily. Continue Xifaxan.    He continues to follow with [facility] for liver transplant candidacy. Currently,  he is finding it difficult to obtain a support person.    PLAN    Continue current diuretic regimen [place] para with max 4 liters, albumin 25 g IV at start Will arrange standing orders for 3 paras; patient to let us know if needs one. Will need limit of 4 liters and Albumin 25 g IV at time of para, holding diuretics day of para MRI in Feb 2024 (on recall) Continue  Cipro daily Continue Eliquis BID Colonoscopy/EGD in fall 2024 3 month follow-up Continue follow-up with [facility]   Annitta Needs, PhD, ANP-BC [facility]

## 2022-06-16 ENCOUNTER — Telehealth: Payer: Self-pay

## 2022-06-16 NOTE — Telephone Encounter (Signed)
Documentation from [facility] regarding: Adverse Drug Reaction Program for this pt. I will place in the yellow folder on your desk.

## 2022-06-21 ENCOUNTER — Encounter (HOSPITAL_COMMUNITY): Payer: Self-pay

## 2022-06-21 ENCOUNTER — Ambulatory Visit (HOSPITAL_COMMUNITY)
Admission: RE | Admit: 2022-06-21 | Discharge: 2022-06-21 | Disposition: A | Discharge status: Home / Self Care | Payer: Medicaid Other | Source: Ambulatory Visit | Attending: Gastroenterology | Admitting: Gastroenterology

## 2022-06-21 DIAGNOSIS — K7460 Unspecified cirrhosis of liver: Secondary | ICD-10-CM | POA: Insufficient documentation

## 2022-06-21 DIAGNOSIS — R188 Other ascites: Secondary | ICD-10-CM | POA: Insufficient documentation

## 2022-06-21 LAB — BODY FLUID CELL COUNT WITH DIFFERENTIAL
Eos, Fluid: 1 %
Lymphs, Fluid: 50 %
Monocyte-Macrophage-Serous Fluid: 41 % — ABNORMAL LOW (ref 50–90)
Neutrophil Count, Fluid: 8 % (ref 0–25)
Total Nucleated Cell Count, Fluid: 220 cu mm (ref 0–1000)

## 2022-06-21 MED ORDER — ALBUMIN HUMAN 25 % IV SOLN
0.0000 g | Freq: Once | INTRAVENOUS | Status: AC
  Filled 2022-06-21: qty 400

## 2022-06-21 MED ORDER — ALBUMIN HUMAN 25 % IV SOLN
INTRAVENOUS | Status: AC
  Administered 2022-06-21: 25 g via INTRAVENOUS
  Filled 2022-06-21: qty 100

## 2022-06-21 NOTE — Procedures (Signed)
  US guided LLQ paracentesis  4 liters max per MD Serosanguinous fluid Sent for labs per MD  Tolerated well  EBL: scant

## 2022-06-21 NOTE — Progress Notes (Signed)
Patient tolerated left sided paracentesis procedure and 25G of IV albumin well today and 4 Liters of ascites removed with labs collected and sent for processing. PT verbalized understanding of discharge instructions and ambulatory at departure with no acute distress noted.

## 2022-06-22 ENCOUNTER — Ambulatory Visit (HOSPITAL_COMMUNITY): Admission: RE | Admit: 2022-06-22 | Payer: Medicaid Other | Source: Ambulatory Visit

## 2022-06-26 ENCOUNTER — Encounter (HOSPITAL_COMMUNITY): Payer: Self-pay

## 2022-06-26 ENCOUNTER — Telehealth: Payer: Self-pay

## 2022-06-26 ENCOUNTER — Ambulatory Visit (HOSPITAL_COMMUNITY)
Admission: RE | Admit: 2022-06-26 | Discharge: 2022-06-26 | Disposition: A | Discharge status: Home / Self Care | Payer: Medicaid Other | Source: Ambulatory Visit | Attending: Gastroenterology | Admitting: Gastroenterology

## 2022-06-26 DIAGNOSIS — K7460 Unspecified cirrhosis of liver: Secondary | ICD-10-CM | POA: Diagnosis not present

## 2022-06-26 DIAGNOSIS — R188 Other ascites: Secondary | ICD-10-CM | POA: Insufficient documentation

## 2022-06-26 LAB — GRAM STAIN

## 2022-06-26 LAB — BODY FLUID CELL COUNT WITH DIFFERENTIAL
Eos, Fluid: 0 %
Lymphs, Fluid: 59 %
Monocyte-Macrophage-Serous Fluid: 35 % — ABNORMAL LOW (ref 50–90)
Neutrophil Count, Fluid: 6 % (ref 0–25)
Total Nucleated Cell Count, Fluid: 291 cu mm (ref 0–1000)

## 2022-06-26 MED ORDER — ALBUMIN HUMAN 25 % IV SOLN
INTRAVENOUS | Status: AC
  Administered 2022-06-26: 25 g via INTRAVENOUS
  Filled 2022-06-26: qty 100

## 2022-06-26 MED ORDER — ALBUMIN HUMAN 25 % IV SOLN: 25.0000 g | Freq: Once | INTRAVENOUS | Status: AC

## 2022-06-26 NOTE — Telephone Encounter (Signed)
I don't like to have more than 4 liters removed in his scenario due to history of CKD and worsening renal function.

## 2022-06-26 NOTE — Telephone Encounter (Signed)
Phoned and advised the pt of your response. Pt expressed understanding

## 2022-06-26 NOTE — Telephone Encounter (Signed)
Pt called regarding his paras and why they only draw off 4 liters at a time. I advised the pt that he has extra paras in the system which he understands but she states by the time he gets one he is swollen all over again and hurting. Pt wants more drawn off please advise. Pt aware you are not in the building today. Please advise

## 2022-06-26 NOTE — Progress Notes (Signed)
Paracentesis complete no signs of distress. 25G IV Albumin given.

## 2022-06-28 ENCOUNTER — Encounter (HOSPITAL_COMMUNITY): Payer: Self-pay

## 2022-06-28 ENCOUNTER — Ambulatory Visit (INDEPENDENT_AMBULATORY_CARE_PROVIDER_SITE_OTHER): Payer: Medicaid Other | Admitting: Clinical

## 2022-06-28 DIAGNOSIS — F411 Generalized anxiety disorder: Secondary | ICD-10-CM

## 2022-06-28 DIAGNOSIS — F0630 Mood disorder due to known physiological condition, unspecified: Secondary | ICD-10-CM | POA: Diagnosis not present

## 2022-06-28 DIAGNOSIS — K7460 Unspecified cirrhosis of liver: Secondary | ICD-10-CM

## 2022-06-28 DIAGNOSIS — R188 Other ascites: Secondary | ICD-10-CM

## 2022-06-28 NOTE — Progress Notes (Signed)
In Person  I connected with Alejandro Porter on 06/28/22 at  9:00 AM EST in person and verified that I am speaking with the correct person using two identifiers.  Location: Patient: Office Provider: Office   I discussed the limitations of evaluation and management by telemedicine and the availability of in person appointments. The patient expressed understanding and agreed to proceed. 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 No data recorded Referral name: No data recorded Referral phone number: No data recorded  Whom do you see for routine medical problems? No data recorded Practice/Facility Name: No data recorded Practice/Facility Phone Number: No data recorded Name of Contact: No data recorded Contact Number: No data recorded Contact Fax Number: No data recorded Prescriber Name: No data recorded Prescriber Address (if known): No data recorded  What Is the Reason for Your Visit/Call Today? No data recorded How Long Has This Been Causing You Problems? No data recorded What Do You Feel Would Help You the Most Today? No data recorded  Have You Recently Been in Any Inpatient Treatment (Hospital/Detox/Crisis Center/28-Day Program)? No data recorded Name/Location of Program/Hospital:No data recorded How Long Were You There? No data recorded When Were You Discharged? No data recorded  Have You Ever Received Services From [facility] Before? No data recorded Who Do You See at [facility]? No data recorded  Have You Recently Had Any Thoughts About Hurting Yourself? No data recorded Are You Planning to Commit Suicide/Harm Yourself At This time? No data recorded  Have you Recently Had Thoughts About  [place]? No data recorded Explanation: No data recorded  Have You Used Any Alcohol or Drugs in the Past 24 Hours? No data recorded How Long Ago Did You Use Drugs or Alcohol? No data recorded What Did You Use and How Much? No data recorded  Do You Currently Have a Therapist/Psychiatrist? No data recorded Name of Therapist/Psychiatrist: No data recorded  Have You Been Recently Discharged From Any Office Practice or Programs? No data recorded Explanation of Discharge From Practice/Program: No data recorded    CCA Screening Triage Referral Assessment Type of Contact: No data recorded Is this Initial or Reassessment? No data recorded Date Telepsych consult ordered in [facility]:  No data recorded Time Telepsych consult ordered in [facility]:  No data recorded  Patient Reported Information Reviewed? No data recorded Patient Left Without Being Seen? No data recorded Reason for Not Completing Assessment: No data recorded  Collateral Involvement: No data recorded  Does Patient Have a [place]? No data recorded Name and Contact of Legal Guardian: No data recorded If Minor and Not Living with Parent(s), Who has Custody? No data recorded Is CPS involved or ever been involved? No data recorded Is APS involved or ever been involved? No data recorded  Patient Determined To Be At Risk for Harm To Self or Others Based on Review of Patient Reported Information or Presenting Complaint? No data recorded Method: No data recorded Availability of Means: No data recorded Intent: No data recorded Notification Required: No data recorded Additional Information for Danger to Others Potential: No data recorded Additional Comments for Danger to Others Potential: No data recorded Are There Guns or Other Weapons in Your Home? No data  recorded Types of Guns/Weapons: No data recorded Are These Weapons Safely Secured?                            No data recorded Who Could Verify You Are  Able To Have These Secured: No data recorded Do You Have any Outstanding Charges, Pending Court Dates, Parole/Probation? No data recorded Contacted To Inform of Risk of Harm To Self or Others: No data recorded  Location of Assessment: No data recorded  Does Patient Present under Involuntary Commitment? No data recorded IVC Papers Initial File Date: No data recorded  [place] of Residence: No data recorded  Patient Currently Receiving the Following Services: No data recorded  Determination of Need: No data recorded  Options For Referral: No data recorded    CCA Biopsychosocial Intake/Chief Complaint:  The patient was referred by Gastrologist for assessment for [place] counseling service with indication of difficulty for Depression.  Current Symptoms/Problems: The patient notes he has been sick (cirosis of the liver and hernea)  over the past serveral years and his Mother died and his brother died and he didnt get to spend time with them before they passed away   Patient Reported Schizophrenia/Schizoaffective Diagnosis in Past: No   Strengths: The patient notes he is self independant and cleans the home and gets his own groceries.  Preferences: Watching Tv, spending time with his Father and going to [place] .  Abilities: Playing Video Games   Type of Services Patient Feels are Needed: The patient notes his PCP has been prescribing med management for his Depression.   Initial Clinical Notes/Concerns: The patient has some identified health problems and is working to get on the list for liver transplant . The patient notes no prior involvement with MH counseling services. The patient has had no prior hospitalizations for MH. The patient notes, " I dont want to hurt myself or anyone else.   Mental Health Symptoms Depression:   Change in energy/activity; Difficulty Concentrating; Fatigue; Hopelessness; Tearfulness; Increase/decrease in appetite; Irritability; Weight gain/loss; Sleep (too  much or little)   Duration of Depressive symptoms:  Greater than two weeks   Mania:   None   Anxiety:    Worrying; Tension; Sleep; Restlessness; Irritability; Fatigue; Difficulty concentrating   Psychosis:   None   Duration of Psychotic symptoms: NA  Trauma:   None   Obsessions:   None   Compulsions:   None   Inattention:   None   Hyperactivity/Impulsivity:   None   Oppositional/Defiant Behaviors:   None   Emotional Irregularity:   None   Other Mood/Personality Symptoms:   NA    Mental Status Exam Appearance and self-care  Stature:   Average   Weight:   Overweight   Clothing:   Casual   Grooming:   Normal   Cosmetic use:   None   Posture/gait:   Normal   Motor activity:   Not Remarkable   Sensorium  Attention:   Normal   Concentration:   Anxiety interferes   Orientation:   X5   Recall/memory:   Defective in Short-term   Affect and Mood  Affect:   Appropriate   Mood:   Anxious; Depressed   Relating  Eye contact:   Normal   Facial expression:   Responsive; Sad   Attitude toward examiner:   Cooperative   Thought and Language  Speech flow:  Normal   Thought content:   Appropriate to Mood and  Circumstances   Preoccupation:   None   Hallucinations:   None   Organization:  Logical  Transport planner of Knowledge:   Good   Intelligence:   Average   Abstraction:   Normal   Judgement:   Good   Reality Testing:   Realistic   Insight:   Good   Decision Making:   Normal   Social Functioning  Social Maturity:   Isolates   Social Judgement:   Normal   Stress  Stressors:   Grief/losses; Other (Comment); Transitions (The patient notes he lost his Mother and youngest brother in 2022.. The patient is currently being impacted by his physical health problems and is looking to get added to liver transplant list,.)   Coping Ability:   Normal   Skill Deficits:   None   Supports:    Family; Church (Father)     Religion: Religion/Spirituality Are You A Religious Person?: Yes What is Your Religious Affiliation?: Baptist How Might This Affect Treatment?: The patient notes he attends [facility] [facility] in [place] and attends with his Father.  Leisure/Recreation: Leisure / Recreation Do You Have Hobbies?: Yes Leisure and Hobbies: Mudlogger  Exercise/Diet: Exercise/Diet Do You Exercise?: No Have You Gained or Lost A Significant Amount of Weight in the Past Six Months?: Yes-Gained Number of Pounds Gained: 25 (The patient notes he has difficulty with gaining fluid due to health problems and has been going in 2x per week to have fluid drained.) Do You Follow a Special Diet?: No Do You Have Any Trouble Sleeping?: Yes Explanation of Sleeping Difficulties: The patient notes having difficulty with falling asleep as well as staying asleep.   CCA Employment/Education Employment/Work Situation: Employment / Work Technical sales engineer: On disability Why is Patient on Disability: Physical Health problems How Long has Patient Been on Disability: 2/ years Patient's Job has Been Impacted by Current Illness: No What is the Longest Time Patient has Held a Job?: The patient notes never having a prior formal job and has always previously lived with his Father Has Patient ever Been in the [employer]?: No  Education: Education Is Patient Currently Attending School?: No Last Grade Completed: 7 Name of High School: NA Did Teacher, adult education From [employer]?: No Did [place]?: No Did Heritage manager?: No Did You Have Any Special Interests In School?: NA Did You Have An Individualized Education Program (IIEP): No Did You Have Any Difficulty At School?: No Patient's Education Has Been Impacted by Current Illness: No   CCA Family/Childhood History Family and Relationship History: Family history Marital status: Single Are you sexually active?:  No What is your sexual orientation?: Heterosexual Has your sexual activity been affected by drugs, alcohol, medication, or emotional stress?: NA Does patient have children?: No  Childhood History:  Childhood History By whom was/is the patient raised?: Grandparents Additional childhood history information: The patient notes that he was raised by his Paternal Grandmother / Great Abunt Description of patient's relationship with caregiver when they were a child: The patient notes he has a good relationship with her grandmother Patient's description of current relationship with people who raised him/her: Grandmother is deceased. How were you disciplined when you got in trouble as a child/adolescent?: Grounding Does patient have siblings?: Yes Number of Siblings: 2 Description of patient's current relationship with siblings: The patient notes having a older brother (living ) and a younger brother (deceased).. The patient notes having a "so-so" relationship with his older brother currently. Did patient  suffer any verbal/emotional/physical/sexual abuse as a child?: No Did patient suffer from severe childhood neglect?: No Has patient ever been sexually abused/assaulted/raped as an adolescent or adult?: No Was the patient ever a victim of a crime or a disaster?: No Witnessed domestic violence?: No Has patient been affected by domestic violence as an adult?: No  Child/Adolescent Assessment:     CCA Substance Use Alcohol/Drug Use: Alcohol / Drug Use Pain Medications: See MAR Prescriptions: See MAT Over the Counter: NONE History of alcohol / drug use?: No history of alcohol / drug abuse Longest period of sobriety (when/how long): NA                         ASAM's:  Six Dimensions of Multidimensional Assessment  Dimension 1:  Acute Intoxication and/or Withdrawal Potential:      Dimension 2:  Biomedical Conditions and Complications:      Dimension 3:  Emotional, Behavioral, or  Cognitive Conditions and Complications:     Dimension 4:  Readiness to Change:     Dimension 5:  Relapse, Continued use, or Continued Problem Potential:     Dimension 6:  Recovery/Living Environment:     ASAM Severity Score:    ASAM Recommended Level of Treatment:     Substance use Disorder (SUD)    Recommendations for Services/Supports/Treatments: Recommendations for Services/Supports/Treatments Recommendations For Services/Supports/Treatments: Medication Management, Individual Therapy  DSM5 Diagnoses: Patient Active Problem List   Diagnosis Date Noted   Rectal bleeding 03/15/2022   Dyspepsia 03/15/2022   Absolute anemia 20/25/4270   Umbilical hernia 62/37/6283   Right inguinal hernia 09/24/2020   OSA on CPAP 09/24/2020   Staph Auricularis SBP (spontaneous bacterial peritonitis) ([place]) 09/14/2020   Hyponatremia 09/14/2020   GERD (gastroesophageal reflux disease) 09/14/2020   Dysphagia 08/05/2020   Abdominal pain 08/05/2020   Encephalopathy, hepatic ([place]) 05/11/2020   Constipation 02/04/2020   Thrombocytopenia ([place]) 10/20/2019   Acute liver failure without hepatic coma    Other ascites    Colorectal polyps    Cecal polyp    Hepatic cirrhosis ([place]) 09/10/2019   Edema 09/10/2019   Portal hypertension ([place]) 09/10/2019   Cirrhosis of liver with ascites ([place]) 09/10/2019   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   Anasarca     Patient Centered Plan: Patient is on the following Treatment Plan(s):  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 Patient/Guardian was advised if they have not already done so to contact the registration department to sign all necessary forms in order for [place] to release information regarding their care.   Consent: Patient/Guardian gives verbal consent for treatment and assignment of benefits for services provided during this visit. Patient/Guardian expressed understanding and agreed to proceed.   I discussed the assessment and treatment plan with the patient. The patient was provided an opportunity to ask questions and all were answered. The patient agreed  with the plan and demonstrated an understanding of the instructions.   The patient was advised to call back or seek an in-person evaluation if the symptoms worsen or if the condition fails to improve as anticipated.  I provided 60 minutes of face-to-face time during this encounter.   Lennox Grumbles, LCSW   06/28/2022

## 2022-06-29 ENCOUNTER — Encounter (HOSPITAL_COMMUNITY): Payer: Self-pay

## 2022-06-29 ENCOUNTER — Ambulatory Visit (HOSPITAL_COMMUNITY)
Admission: RE | Admit: 2022-06-29 | Discharge: 2022-06-29 | Disposition: A | Discharge status: Home / Self Care | Payer: Medicaid Other | Source: Ambulatory Visit | Attending: Gastroenterology | Admitting: Gastroenterology

## 2022-06-29 DIAGNOSIS — K7460 Unspecified cirrhosis of liver: Secondary | ICD-10-CM | POA: Diagnosis present

## 2022-06-29 DIAGNOSIS — R188 Other ascites: Secondary | ICD-10-CM | POA: Insufficient documentation

## 2022-06-29 LAB — BODY FLUID CELL COUNT WITH DIFFERENTIAL
Eos, Fluid: 0 %
Lymphs, Fluid: 63 %
Monocyte-Macrophage-Serous Fluid: 32 % — ABNORMAL LOW (ref 50–90)
Neutrophil Count, Fluid: 5 % (ref 0–25)
Other Cells, Fluid: 2 %
Total Nucleated Cell Count, Fluid: 290 cu mm (ref 0–1000)

## 2022-06-29 LAB — PATHOLOGIST SMEAR REVIEW

## 2022-06-29 MED ORDER — ALBUMIN HUMAN 25 % IV SOLN
0.0000 g | Freq: Once | INTRAVENOUS | Status: AC
  Filled 2022-06-29: qty 400

## 2022-06-29 MED ORDER — ALBUMIN HUMAN 25 % IV SOLN
INTRAVENOUS | Status: AC
  Administered 2022-06-29: 25 g via INTRAVENOUS
  Filled 2022-06-29: qty 100

## 2022-06-29 NOTE — Progress Notes (Signed)
Patient tolerated left sided paracentesis procedure and 25 G of IV albumin well today and 4 Liters of ascites removed with labs collected and sent for processing. Pt verbalized understanding of discharge instructions and ambulatory at departure with no acute distress noted.

## 2022-06-29 NOTE — Telephone Encounter (Signed)
Noted. Info from insurance regarding Xifaxan and diuretic therapy. No changes to meds.

## 2022-06-29 NOTE — Procedures (Signed)
   Cirrhosis Fatty liver  US guided LLQ paracentesis 4 Liter max per MD Sent for labs Tolerated well  EBL: less than 1 cc

## 2022-06-30 ENCOUNTER — Ambulatory Visit (HOSPITAL_COMMUNITY): Admission: RE | Admit: 2022-06-30 | Payer: Medicaid Other | Source: Ambulatory Visit

## 2022-07-01 LAB — CULTURE, BODY FLUID W GRAM STAIN -BOTTLE: Culture: NO GROWTH

## 2022-07-03 ENCOUNTER — Encounter (HOSPITAL_COMMUNITY): Payer: Self-pay

## 2022-07-03 ENCOUNTER — Ambulatory Visit (HOSPITAL_COMMUNITY)
Admission: RE | Admit: 2022-07-03 | Discharge: 2022-07-03 | Disposition: A | Discharge status: Home / Self Care | Payer: Medicaid Other | Source: Ambulatory Visit | Attending: Gastroenterology | Admitting: Gastroenterology

## 2022-07-03 DIAGNOSIS — R188 Other ascites: Secondary | ICD-10-CM | POA: Diagnosis present

## 2022-07-03 DIAGNOSIS — K7460 Unspecified cirrhosis of liver: Secondary | ICD-10-CM | POA: Diagnosis present

## 2022-07-03 LAB — BODY FLUID CELL COUNT WITH DIFFERENTIAL
Eos, Fluid: 0 %
Lymphs, Fluid: 76 %
Monocyte-Macrophage-Serous Fluid: 17 % — ABNORMAL LOW (ref 50–90)
Neutrophil Count, Fluid: 7 % (ref 0–25)
Total Nucleated Cell Count, Fluid: 273 cu mm (ref 0–1000)

## 2022-07-03 MED ORDER — ALBUMIN HUMAN 25 % IV SOLN
INTRAVENOUS | Status: AC
  Administered 2022-07-03: 25 g via INTRAVENOUS
  Filled 2022-07-03: qty 100

## 2022-07-03 MED ORDER — ALBUMIN HUMAN 25 % IV SOLN: 25.0000 g | Freq: Once | INTRAVENOUS | Status: AC

## 2022-07-03 NOTE — Progress Notes (Signed)
Patient tolerated right sided paracentesis procedure and 25G of IV albumin well today and 4 Liters of ascites removed with labs collected and sent for processing. PT verbalized understanding of post procedure instructions and ambulatory at departure with no acute distress noted.

## 2022-07-03 NOTE — Procedures (Signed)
PreOperative Dx: Cirrhosis, ascites °Postoperative Dx: Cirrhosis, ascites °Procedure:   US guided paracentesis °Radiologist:  Gurkirat Basher °Anesthesia:  10 ml of1% lidocaine °Specimen:  4 L of yellow ascitic fluid °EBL:   < 1 ml °Complications: None   °

## 2022-07-04 NOTE — Progress Notes (Signed)
   Portal Hypertension Clinic Screening Evaluation   Indication for evaluation: Alejandro Porter is a 47 y.o. male undergoing preliminary evaluation in the [facility]ventional Radiology Portal Hypertension Clinic due to recurrent ascites.  Referring Physician/Established Gastroenterologist:  Hurshel Keys, DO, Roseanne Kaufman, NP, Roosevelt Locks, NP  Etiology of cirrhosis: MASH Initially diagnosed: 2021 # of paracentesis in last month: 5 # of paracentesis in last 2 months: 5 History of hepatic hydrothorax:  no History of hepatic encephalopathy: yes  Prior evaluation for liver transplant: Undergoing evaluation with [facility] currently History of hepatocellular carcinoma: no, 2 LIRADS 3 masses  Prior esophagogastroduodenoscopy/intervention: 03/24/22 - small esophageal varices and portal hypetensive gastropathy, no intervention Current esophageal varices: yes Current gastric varices: no History of hematemesis: no  Current diuretic regimen: furosemide 40 mg BID, spironolactone 100 mg QD Current pharmacologic encephalopathy prophylaxis/treatment: lactulose + rifaximin  History of renal dysfunction: yes, CKD IIIa History of hemodialysis: no  History of cardiac dysfunction: no  Other pertinent past medical history: GERD, hypertension, obesity, neuropathy, obstructive sleep apnea   Imaging: Prior cross sectional imaging of portal system: CT abdomen 01/02/22  Recanalized periumbilical varices  MR abdomen 04/05/22  Non-occlusive main portal thrombus  US hepatic doppler 04/19/22    Echocardiogram: 12/11/20 IMPRESSIONS     1. Left ventricular ejection fraction, by estimation, is 55 to 60%. The  left ventricle has normal function. The left ventricle has no regional  wall motion abnormalities. Left ventricular diastolic parameters were  normal.   2. Right ventricular systolic function is normal. The right ventricular  size is normal. There is normal pulmonary artery systolic  pressure.   3. The mitral valve is normal in structure. Trivial mitral valve  regurgitation. No evidence of mitral stenosis.   4. The aortic valve is tricuspid. Aortic valve regurgitation is not  visualized. No aortic stenosis is present.   5. The inferior vena cava is normal in size with greater than 50%  respiratory variability, suggesting right atrial pressure of 3 mmHg.    Labs: 12/11-27/23 Creatinine: 1.46 Total Bilirubin: 1.0 INR: 1.1 Sodium: 126 Albumin: 3.8  Child-Pugh = 8 points, class B MELD = 11 (6.0% estimated 3 month mortality) [place] Index of Post-TIPS Survival (FIPS) = -0.18 (overall survival predicted at 1 month 96.2%, 3 months 87.7%, and 6 months 82.0%    Assessment: Alejandro Porter is a 47 y.o. 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  He is currently undergoing evaluation for transplant candidacy with Roosevelt Locks, NP.  After preliminary evaluation, this patient could possibly benefit for TIPS creation for: -management of recurrent/refractory ascites -improvement of presumed underlying hepatorenal syndrome -endovascular management of partial portal thrombus, which given chronicity is prone to progression and could thwart future transplant options or require much more difficult recanalization -embolization of recanalized paraumbilical vein which could certainly contribute to encephalopathy due to portosystemic shunting  Recommendation: Formal consult for TIPS creation could be considered.  The patient's care team will be contacted for further discussion.    Electronically Signed: Suzette Battiest, MD 07/04/2022, 4:16 PM

## 2022-07-06 ENCOUNTER — Encounter (HOSPITAL_COMMUNITY): Payer: Self-pay

## 2022-07-06 ENCOUNTER — Ambulatory Visit (HOSPITAL_COMMUNITY)
Admission: RE | Admit: 2022-07-06 | Discharge: 2022-07-06 | Disposition: A | Discharge status: Home / Self Care | Payer: Medicaid Other | Source: Ambulatory Visit | Attending: Gastroenterology | Admitting: Gastroenterology

## 2022-07-06 DIAGNOSIS — K7460 Unspecified cirrhosis of liver: Secondary | ICD-10-CM | POA: Diagnosis present

## 2022-07-06 DIAGNOSIS — R188 Other ascites: Secondary | ICD-10-CM | POA: Diagnosis present

## 2022-07-06 LAB — BODY FLUID CELL COUNT WITH DIFFERENTIAL
Eos, Fluid: 0 %
Lymphs, Fluid: 62 %
Monocyte-Macrophage-Serous Fluid: 30 % — ABNORMAL LOW (ref 50–90)
Neutrophil Count, Fluid: 8 % (ref 0–25)
Total Nucleated Cell Count, Fluid: 254 cu mm (ref 0–1000)

## 2022-07-06 MED ORDER — LIDOCAINE HCL (PF) 2 % IJ SOLN
10.0000 mL | Freq: Once | INTRAMUSCULAR | Status: AC
  Administered 2022-07-06: 10 mL

## 2022-07-06 MED ORDER — ALBUMIN HUMAN 25 % IV SOLN: 25.0000 g | Freq: Once | INTRAVENOUS | Status: AC

## 2022-07-06 MED ORDER — LIDOCAINE HCL (PF) 2 % IJ SOLN
INTRAMUSCULAR | Status: AC
  Filled 2022-07-06: qty 10

## 2022-07-06 MED ORDER — ALBUMIN HUMAN 25 % IV SOLN
INTRAVENOUS | Status: AC
  Administered 2022-07-06: 25 g via INTRAVENOUS
  Filled 2022-07-06: qty 100

## 2022-07-06 MED ORDER — LIDOCAINE HCL (PF) 2 % IJ SOLN
INTRAMUSCULAR | Status: AC
  Administered 2022-07-06: 10 mL
  Filled 2022-07-06: qty 10

## 2022-07-06 MED ORDER — LIDOCAINE HCL (PF) 2 % IJ SOLN: 10.0000 mL | Freq: Once | INTRAMUSCULAR | Status: AC

## 2022-07-06 NOTE — Procedures (Signed)
PreOperative Dx: Cirrhosis, ascites °Postoperative Dx: Cirrhosis, ascites °Procedure:   US guided paracentesis °Radiologist:  Shelise Maron °Anesthesia:  10 ml of1% lidocaine °Specimen:  4 L of yellow ascitic fluid °EBL:   < 1 ml °Complications: None   °

## 2022-07-06 NOTE — Progress Notes (Signed)
Patient tolerated left sided paracentesis procedure and 25G of IV albumin well today and 4 Liters of ascites removed with labs collected and sent for processing. PT verbalized understanding of post procedure instructions and ambulatory at departure with no acute distress noted.

## 2022-07-10 ENCOUNTER — Other Ambulatory Visit: Payer: Self-pay

## 2022-07-10 ENCOUNTER — Telehealth: Payer: Self-pay

## 2022-07-10 ENCOUNTER — Ambulatory Visit (HOSPITAL_COMMUNITY)
Admission: RE | Admit: 2022-07-10 | Discharge: 2022-07-10 | Disposition: A | Discharge status: Home / Self Care | Payer: Medicaid Other | Source: Ambulatory Visit | Attending: Gastroenterology | Admitting: Gastroenterology

## 2022-07-10 ENCOUNTER — Encounter (HOSPITAL_COMMUNITY): Payer: Self-pay

## 2022-07-10 DIAGNOSIS — K7460 Unspecified cirrhosis of liver: Secondary | ICD-10-CM

## 2022-07-10 DIAGNOSIS — R188 Other ascites: Secondary | ICD-10-CM

## 2022-07-10 DIAGNOSIS — K7682 Hepatic encephalopathy: Secondary | ICD-10-CM

## 2022-07-10 LAB — BODY FLUID CELL COUNT WITH DIFFERENTIAL
Eos, Fluid: 0 %
Lymphs, Fluid: 43 %
Monocyte-Macrophage-Serous Fluid: 45 % — ABNORMAL LOW (ref 50–90)
Neutrophil Count, Fluid: 12 % (ref 0–25)
Total Nucleated Cell Count, Fluid: 405 cu mm (ref 0–1000)

## 2022-07-10 MED ORDER — ALBUMIN HUMAN 25 % IV SOLN
INTRAVENOUS | Status: AC
  Administered 2022-07-10: 25 g via INTRAVENOUS
  Filled 2022-07-10: qty 100

## 2022-07-10 MED ORDER — ALBUMIN HUMAN 25 % IV SOLN: 25.0000 g | Freq: Once | INTRAVENOUS | Status: AC

## 2022-07-10 NOTE — Telephone Encounter (Signed)
Phoned and spoke with the pt and advised him of the note/instructions. Pt agreed to referral

## 2022-07-10 NOTE — Telephone Encounter (Signed)
Pt called from the hospital this morning using his last para. Pt wants you to put in more orders. He states just having 4 liters drawn off has him using up the para's fast. He also questioned a procedure you were checking in on for him. He stated something about a tube going from neck to his liver???. Please advise

## 2022-07-10 NOTE — Procedures (Signed)
PreOperative Dx: Cirrhosis, ascites °Postoperative Dx: Cirrhosis, ascites °Procedure:   US guided paracentesis °Radiologist:  Amery Vandenbos °Anesthesia:  10 ml of1% lidocaine °Specimen:  4 L of yellow ascitic fluid °EBL:   < 1 ml °Complications: None   °

## 2022-07-10 NOTE — Telephone Encounter (Signed)
Please have patient complete BMP. We need to see about titrating diuretics. He may be referring to a TIPS procedure. I don't know if he is a candidate for that or not, but we can refer him for formal evaluation to IR to discuss TIPS.

## 2022-07-10 NOTE — Progress Notes (Signed)
Patient tolerated right sided paracentesis procedure and 25G of IV albumin well today and 4 Liters of ascites removed with labs collected and sent for processing. PT verbalized understanding of discharge instructions and ambulatory at departure with no acute distress noted.

## 2022-07-11 NOTE — Telephone Encounter (Signed)
Referral placed.

## 2022-07-11 NOTE — Addendum Note (Signed)
Addended by: Cheron Every on: 07/11/2022 10:51 AM   Modules accepted: Orders

## 2022-07-12 ENCOUNTER — Other Ambulatory Visit: Payer: Self-pay | Admitting: Gastroenterology

## 2022-07-12 LAB — BASIC METABOLIC PANEL
BUN/Creatinine Ratio: 15 (calc) (ref 6–22)
BUN: 22 mg/dL (ref 7–25)
CO2: 22 mmol/L (ref 20–32)
Calcium: 9.5 mg/dL (ref 8.6–10.3)
Chloride: 96 mmol/L — ABNORMAL LOW (ref 98–110)
Creat: 1.45 mg/dL — ABNORMAL HIGH (ref 0.60–1.29)
Glucose, Bld: 131 mg/dL — ABNORMAL HIGH (ref 65–99)
Potassium: 5.5 mmol/L — ABNORMAL HIGH (ref 3.5–5.3)
Sodium: 126 mmol/L — ABNORMAL LOW (ref 135–146)

## 2022-07-13 ENCOUNTER — Other Ambulatory Visit: Payer: Self-pay

## 2022-07-13 ENCOUNTER — Other Ambulatory Visit: Payer: Self-pay | Admitting: *Deleted

## 2022-07-13 DIAGNOSIS — R188 Other ascites: Secondary | ICD-10-CM

## 2022-07-13 DIAGNOSIS — K7682 Hepatic encephalopathy: Secondary | ICD-10-CM

## 2022-07-13 DIAGNOSIS — K7460 Unspecified cirrhosis of liver: Secondary | ICD-10-CM

## 2022-07-17 ENCOUNTER — Encounter (HOSPITAL_COMMUNITY): Payer: Self-pay

## 2022-07-17 ENCOUNTER — Ambulatory Visit (HOSPITAL_COMMUNITY)
Admission: RE | Admit: 2022-07-17 | Discharge: 2022-07-17 | Disposition: A | Discharge status: Home / Self Care | Payer: Medicaid Other | Source: Ambulatory Visit | Attending: Gastroenterology | Admitting: Gastroenterology

## 2022-07-17 DIAGNOSIS — R188 Other ascites: Secondary | ICD-10-CM | POA: Insufficient documentation

## 2022-07-17 LAB — BODY FLUID CELL COUNT WITH DIFFERENTIAL
Eos, Fluid: 0 %
Lymphs, Fluid: 35 %
Monocyte-Macrophage-Serous Fluid: 54 % (ref 50–90)
Neutrophil Count, Fluid: 11 % (ref 0–25)
Total Nucleated Cell Count, Fluid: 324 cu mm (ref 0–1000)

## 2022-07-17 MED ORDER — ALBUMIN HUMAN 25 % IV SOLN
INTRAVENOUS | Status: AC
  Administered 2022-07-17: 25 g via INTRAVENOUS
  Filled 2022-07-17: qty 100

## 2022-07-17 MED ORDER — ALBUMIN HUMAN 25 % IV SOLN
0.0000 g | Freq: Once | INTRAVENOUS | Status: AC
  Filled 2022-07-17: qty 400

## 2022-07-17 NOTE — Progress Notes (Signed)
Patient tolerated left sided paracentesis procedure and 25G of IV albumin well today and 5 Liters of ascites removed today with labs collected and sent for processing. PT verbalized understanding of discharge instructions and ambulatory at departure with no acute distress noted.

## 2022-07-18 LAB — BASIC METABOLIC PANEL
BUN/Creatinine Ratio: 14 (calc) (ref 6–22)
BUN: 19 mg/dL (ref 7–25)
CO2: 22 mmol/L (ref 20–32)
Calcium: 9 mg/dL (ref 8.6–10.3)
Chloride: 94 mmol/L — ABNORMAL LOW (ref 98–110)
Creat: 1.38 mg/dL — ABNORMAL HIGH (ref 0.60–1.29)
Glucose, Bld: 87 mg/dL (ref 65–99)
Potassium: 5.2 mmol/L (ref 3.5–5.3)
Sodium: 124 mmol/L — ABNORMAL LOW (ref 135–146)

## 2022-07-19 ENCOUNTER — Ambulatory Visit
Admission: RE | Admit: 2022-07-19 | Discharge: 2022-07-19 | Disposition: A | Discharge status: Home / Self Care | Payer: Medicaid Other | Source: Ambulatory Visit | Attending: Gastroenterology | Admitting: Gastroenterology

## 2022-07-19 ENCOUNTER — Other Ambulatory Visit: Payer: Self-pay | Admitting: Interventional Radiology

## 2022-07-19 DIAGNOSIS — R188 Other ascites: Secondary | ICD-10-CM

## 2022-07-19 DIAGNOSIS — K7581 Nonalcoholic steatohepatitis (NASH): Secondary | ICD-10-CM

## 2022-07-19 HISTORY — PX: IR RADIOLOGIST EVAL & MGMT: IMG5224

## 2022-07-19 NOTE — Consult Note (Signed)
Chief Complaint: Patient presents via virtual telephone consultation today for portal hypertension, consideration of TIPS  Referring Physician(s): Hurshel Keys, DO, Roseanne Kaufman, NP, Roosevelt Locks, NP   History of Present Illness: Alejandro Porter is a 47 y.o. 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    Etiology of cirrhosis: MASH Initially diagnosed: 2021 # of paracentesis in last month: 7 # of paracentesis in last 2 months: 8 History of hepatic hydrothorax:  no History of hepatic encephalopathy: yes   Prior evaluation for liver transplant: Undergoing evaluation with [facility] currently, deemed not a candidate per patient due to transportation/social support factors History of hepatocellular carcinoma: no, 2 LIRADS 3 masses   Prior esophagogastroduodenoscopy/intervention: 03/24/22 - small esophageal varices and portal hypetensive gastropathy, no intervention Current esophageal varices: yes Current gastric varices: no History of hematemesis: no   Current diuretic regimen: furosemide 40 mg BID, spironolactone 100 mg QD Current pharmacologic encephalopathy prophylaxis/treatment: lactulose BID + rifaximin   History of renal dysfunction: yes, CKD IIIa History of hemodialysis: no   History of cardiac dysfunction: no   Other pertinent past medical history: GERD, hypertension, obesity, neuropathy, obstructive sleep apnea  Past Medical History:  Diagnosis Date   CKD (chronic kidney disease) stage 2, GFR 60-89 ml/min    GERD (gastroesophageal reflux disease)    H/O colonoscopy    H/O endoscopy    Hypertension    Liver cirrhosis (HCC)    Neuropathy    OSA (obstructive sleep apnea)    S/P abdominal paracentesis     Past Surgical History:  Procedure Laterality Date   BIOPSY  09/12/2019   Procedure: BIOPSY;  Surgeon: Daneil Dolin, MD;  [facility];  Service: Endoscopy;;   COLONOSCOPY N/A 09/18/2019   Dr. Oneida Alar: 12 colon polyps removed, multiple simple adenomas and some inflammatory polyps, diverticulosis, external and internal hemorrhoids.  Next colonoscopy in 3 years.   COLONOSCOPY WITH PROPOFOL N/A 03/24/2022   Procedure: COLONOSCOPY WITH PROPOFOL;  Surgeon: Eloise Harman, DO;  [facility];  Service: Endoscopy;  Laterality: N/A;  1:30pm, asa 3   ESOPHAGEAL BANDING N/A 09/12/2019   Procedure: ESOPHAGEAL BANDING;  Surgeon: Daneil Dolin, MD;  [facility];  Service: Endoscopy;  Laterality: N/A;   ESOPHAGOGASTRODUODENOSCOPY N/A 09/12/2019   Dr. Gala Romney: Mild erosive reflux esophagitis.  Schatzki ring status post dilation.  Small grade 1/grade 2 esophageal varices, portal hypertensive gastropathy, hyperplastic gastric polyp, gastric erosions with chronic inactive gastritis on biopsies, no H. pylori.   ESOPHAGOGASTRODUODENOSCOPY (EGD) WITH PROPOFOL N/A 12/16/2020   two short columns of no more than Grade 2 esophageal varices, appearing innocent. Grade 1 varices not apparent today. Multiple large posterior body and antral hyperplastic, hemorrhagic appearing polyps, larges approximately 2.5 cm pedunculated. Portal gastropathy. Normal duodenum. Polypectomy with clips placed. EGD in 18 months.   ESOPHAGOGASTRODUODENOSCOPY (EGD) WITH PROPOFOL N/A 03/24/2022   Procedure: ESOPHAGOGASTRODUODENOSCOPY (EGD) WITH PROPOFOL;  Surgeon: Eloise Harman, DO;  [facility];  Service: Endoscopy;  Laterality: N/A;   POLYPECTOMY  09/18/2019   Procedure: POLYPECTOMY;  Surgeon: Danie Binder, MD;  [facility];  Service: Endoscopy;;   POLYPECTOMY  12/16/2020   Procedure: POLYPECTOMY;  Surgeon: Daneil Dolin, MD;  [facility];  Service: Endoscopy;;  gastric   POLYPECTOMY  03/24/2022   Procedure: POLYPECTOMY;  Surgeon: Eloise Harman, DO;  [facility];  Service: Endoscopy;;     Allergies: Chlorthalidone and Metoprolol  Medications: Prior to Admission medications   Medication Sig Start Date End Date Taking? Authorizing Provider  apixaban (ELIQUIS) 5 MG TABS tablet Take 1 tablet (5 mg total) by mouth 2 (two) times daily. 04/20/22   Annitta Needs, NP  Cholecalciferol (VITAMIN D3) 125 MCG (5000 UT) TABS Take 5,000 Units by mouth in the morning. 06/17/19   [provider]  ciprofloxacin (CIPRO) 500 MG tablet TAKE 1 TABLET BY MOUTH DAILY 07/12/22   Annitta Needs, NP  CONSTULOSE 10 GM/15ML solution take 30 mls BY MOUTH TWICE DAILY 06/07/22   Annitta Needs, NP  cyclobenzaprine (FLEXERIL) 5 MG tablet Take 5 mg by mouth 3 (three) times daily as needed for muscle spasms. 09/29/20   [provider]  DULoxetine (CYMBALTA) 60 MG capsule Take 60 mg by mouth daily. 05/03/22   [provider]  ferrous sulfate 325 (65 FE) MG EC tablet Take 325 mg by mouth in the morning and at bedtime.    [provider]  furosemide (LASIX) 40 MG tablet Take 40 mg by mouth 2 (two) times daily. 01/23/22   [provider]  gabapentin (NEURONTIN) 300 MG capsule Take 300 mg by mouth 3 (three) times daily as needed (pain.).    [provider]  levOCARNitine (CARNITOR) 330 MG tablet Take 330 mg by mouth 3 (three) times daily. 09/01/20   [provider]  Multiple Vitamin (MULTIVITAMIN) tablet Take 1 tablet by mouth in the morning.    [provider]  ondansetron (ZOFRAN) 4 MG tablet TAKE 1 TABLET BY MOUTH EVERY 6 HOURS AS NEEDED FOR NAUSEA OR FOR VOMITING 05/24/22   Carlan, Chelsea L, NP  pantoprazole (PROTONIX) 40 MG tablet Take 1 tablet (40 mg total) by mouth daily. 30 minutes before breakfast. Take instead of omeprazole 10/04/21   Annitta Needs, NP  spironolactone (ALDACTONE) 100 MG tablet Take 100 mg by mouth daily.    [provider]  XIFAXAN 550 MG TABS tablet TAKE 1 TABLET BY MOUTH TWICE DAILY 09/13/21   Sherron Monday, NP   Zinc 50 MG TABS Take 50 mg by mouth in the morning.    [provider]     Family History  Problem Relation Age of Onset   Brain cancer Mother    Diabetes Mother    Diabetes Father    Pulmonary embolism Brother    Liver disease Neg Hx     Social History   Socioeconomic History   Marital status: Single    Spouse name: Not on file   Number of children: Not on file   Years of education: Not on file   Highest education level: Not on file  Occupational History   Occupation: umemployed  Tobacco Use   Smoking status: Never    Passive exposure: Current   Smokeless tobacco: Never  Vaping Use   Vaping Use: Never used  Substance and Sexual Activity   Alcohol use: Never   Drug use: Never   Sexual activity: Not Currently  Other Topics Concern   Not on file  Social History Narrative   Not on file   Social Determinants of Health   Financial Resource Strain: Not on file  Food Insecurity: Not on file  Transportation Needs: Not on file  Physical Activity: Not on file  Stress: Not on file  Social Connections: Not on file    Review of Systems:  A 12 point ROS discussed and pertinent positives are indicated in the HPI above.  All other systems are negative.   Vital Signs: There were no vitals taken for this visit.   No physical examination was performed in lieu of virtual telephone clinic visit.  Imaging: Prior cross sectional imaging of portal system: CT abdomen 01/02/22  Recanalized periumbilical varices   MR abdomen 04/05/22  Non-occlusive main portal thrombus   US hepatic doppler 04/19/22      Echocardiogram: 12/11/20 IMPRESSIONS   1. Left ventricular ejection fraction, by estimation, is 55 to 60%. The  left ventricle has normal function. The left ventricle has no regional  wall motion abnormalities. Left ventricular diastolic parameters were  normal.   2. Right ventricular systolic function is normal. The right ventricular  size is normal. There is  normal pulmonary artery systolic pressure.   3. The mitral valve is normal in structure. Trivial mitral valve  regurgitation. No evidence of mitral stenosis.   4. The aortic valve is tricuspid. Aortic valve regurgitation is not  visualized. No aortic stenosis is present.   5. The inferior vena cava is normal in size with greater than 50%  respiratory variability, suggesting right atrial pressure of 3 mmHg.   Labs: 12/11-27/23 Creatinine: 1.46 Total Bilirubin: 1.0 INR: 1.1 Sodium: 126 Albumin: 3.8   Child-Pugh = 8 points, class B MELD = 11 (6.0% estimated 3 month mortality) [place] Index of Post-TIPS Survival (FIPS) = -0.18 (overall survival predicted at 1 month 96.2%, 3 months 87.7%, and 6 months 82.0%   TUMOR MARKERS: Recent Labs    03/15/22 1053  AFPTM 2.2    Assessment and Plan: Alejandro Porter is a 47 y.o. 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  He is apparently a poor candidate for transplant due to social factors. This patient could possibly benefit for TIPS creation for: -management of recurrent/refractory ascites -improvement of presumed underlying hepatorenal syndrome -endovascular management of partial portal thrombus, which given chronicity is prone to progression and could thwart future transplant options or require much more difficult recanalization -embolization of recanalized paraumbilical vein which could certainly contribute to encephalopathy due to portosystemic shunting  He is interested in proceeding and very tired of so many paracenteses.  We need updated cross sectional imaging and updated MELD labs prior to scheduling for TIPS.   Plan: -CTA BRTO protocol (he is requesting this to be done at [facility]) -CBC, CMP, INR -follow up in 2-3 weeks after these studies are obtained  Electronically  Signed: Suzette Battiest, MD 07/19/2022, 1:56 PM   I spent a total of  40 Minutes  in virtual telephone clinical consultation, greater than 50% of which was counseling/coordinating care for portal hypertension.

## 2022-07-20 ENCOUNTER — Other Ambulatory Visit: Payer: Self-pay | Admitting: Gastroenterology

## 2022-07-20 ENCOUNTER — Other Ambulatory Visit: Payer: Self-pay | Admitting: *Deleted

## 2022-07-20 ENCOUNTER — Other Ambulatory Visit: Payer: Self-pay | Admitting: Interventional Radiology

## 2022-07-20 DIAGNOSIS — D500 Iron deficiency anemia secondary to blood loss (chronic): Secondary | ICD-10-CM

## 2022-07-20 DIAGNOSIS — K7581 Nonalcoholic steatohepatitis (NASH): Secondary | ICD-10-CM

## 2022-07-20 DIAGNOSIS — D508 Other iron deficiency anemias: Secondary | ICD-10-CM

## 2022-07-20 DIAGNOSIS — D696 Thrombocytopenia, unspecified: Secondary | ICD-10-CM

## 2022-07-21 NOTE — Telephone Encounter (Signed)
Pt called advising he is full of fluid once again. His referral appt isn't until Feb. 29th when they will do a CT and bloodwork. Pt states he is needing for more fluid to come off. Pt did agree to communicate through [place] since today is a early day for [place].

## 2022-07-24 NOTE — Telephone Encounter (Signed)
FYI:  Returned the pt's call as well. Pt has been made aware of the instructions/recommendations/ proceeding to the ED if needed. Pt is doing the medications as instructed. Pt to go to ED on his own to have treatment.

## 2022-07-25 NOTE — Progress Notes (Unsigned)
Referring Provider: Sofie Rower, PA-C Primary Care Physician:  Sofie Rower, PA-C Primary GI Physician: Dr. Gala Romney  No chief complaint on file.   HPI:   Alejandro Porter is a 47 y.o. male with a history of cirrhosis complicated by portal hypertension, esophageal varices, portal vein thrombosis on Eliquis, hepatic encephalopathy on lactulose and Xfiaxan, ascites requiring repeat paras, history of SBP in April 2022, IDA followed by Hematology, biliary dyskinesia but not a surgical candidate. He was undergoing liver transplant eval at [facility], but this was closed as it was felt he was not a good candidate secondary to the lack of support.  Since January, he has had worsening ascites requiring increasingly frequent paracentesis anywhere from every 2 weeks to twice a week. Referral has already been placed to IR for consideration of TIPS though not sure he will be a good candidate considering elevated MELD, hx of encephalopathy, and partially occlusive PVT. He is presenting today for further evaluation of worsening ascites***  Currently on Lasix 40 BID and  spironolactone 100 mg daily. Increased titration of diuretics has led to bumps in creatinine in the past. Also with electrolyte abnormalities including hyponatremia and hyperkalemia.  Last paracentesis 2/12 with 5L removed.  Has a pending appointment 2/29 for a CT angio abdomen and pelvis per IR and appointment with IR on 3/11.      EGD Oct 2023; small esophageal varices, portal gastropathy, multiple gastric polyps s/p resection and retrieval (hyperplastic) duodenitis. 1 year surveillance due.    Colonoscopy Oct 2023: fair colon prep, non-bleeding internal hemorrhoids and rectal varices. Stool in colon. 1 year surveillance due to poor prep.    Liver lesions on MRI, normal AFP tumor marker, needs repeat MRI in Feb 2024   Has taken Hep A/B vaccinations, flu, TDAP.    Past Medical History:  Diagnosis Date   CKD (chronic kidney  disease) stage 2, GFR 60-89 ml/min    GERD (gastroesophageal reflux disease)    H/O colonoscopy    H/O endoscopy    Hypertension    Liver cirrhosis (HCC)    Neuropathy    OSA (obstructive sleep apnea)    S/P abdominal paracentesis     Past Surgical History:  Procedure Laterality Date   BIOPSY  09/12/2019   Procedure: BIOPSY;  Surgeon: Daneil Dolin, MD;  [facility];  Service: Endoscopy;;   COLONOSCOPY N/A 09/18/2019   Dr. Oneida Alar: 12 colon polyps removed, multiple simple adenomas and some inflammatory polyps, diverticulosis, external and internal hemorrhoids.  Next colonoscopy in 3 years.   COLONOSCOPY WITH PROPOFOL N/A 03/24/2022   Procedure: COLONOSCOPY WITH PROPOFOL;  Surgeon: Eloise Harman, DO;  [facility];  Service: Endoscopy;  Laterality: N/A;  1:30pm, asa 3   ESOPHAGEAL BANDING N/A 09/12/2019   Procedure: ESOPHAGEAL BANDING;  Surgeon: Daneil Dolin, MD;  [facility];  Service: Endoscopy;  Laterality: N/A;   ESOPHAGOGASTRODUODENOSCOPY N/A 09/12/2019   Dr. Gala Romney: Mild erosive reflux esophagitis.  Schatzki ring status post dilation.  Small grade 1/grade 2 esophageal varices, portal hypertensive gastropathy, hyperplastic gastric polyp, gastric erosions with chronic inactive gastritis on biopsies, no H. pylori.   ESOPHAGOGASTRODUODENOSCOPY (EGD) WITH PROPOFOL N/A 12/16/2020   two short columns of no more than Grade 2 esophageal varices, appearing innocent. Grade 1 varices not apparent today. Multiple large posterior body and antral hyperplastic, hemorrhagic appearing polyps, larges approximately 2.5 cm pedunculated. Portal gastropathy. Normal duodenum. Polypectomy with clips placed. EGD in 18 months.  ESOPHAGOGASTRODUODENOSCOPY (EGD) WITH PROPOFOL N/A 03/24/2022   Procedure: ESOPHAGOGASTRODUODENOSCOPY (EGD) WITH PROPOFOL;  Surgeon: Eloise Harman, DO;  [facility];  Service: Endoscopy;  Laterality: N/A;   IR RADIOLOGIST EVAL &  MGMT  07/19/2022   POLYPECTOMY  09/18/2019   Procedure: POLYPECTOMY;  Surgeon: Danie Binder, MD;  [facility];  Service: Endoscopy;;   POLYPECTOMY  12/16/2020   Procedure: POLYPECTOMY;  Surgeon: Daneil Dolin, MD;  [facility];  Service: Endoscopy;;  gastric   POLYPECTOMY  03/24/2022   Procedure: POLYPECTOMY;  Surgeon: Eloise Harman, DO;  [facility];  Service: Endoscopy;;    Current Outpatient Medications  Medication Sig Dispense Refill   apixaban (ELIQUIS) 5 MG TABS tablet Take 1 tablet (5 mg total) by mouth 2 (two) times daily. 60 tablet 3   Cholecalciferol (VITAMIN D3) 125 MCG (5000 UT) TABS Take 5,000 Units by mouth in the morning.     ciprofloxacin (CIPRO) 500 MG tablet TAKE 1 TABLET BY MOUTH DAILY 30 tablet 5   CONSTULOSE 10 GM/15ML solution take 30 mls BY MOUTH TWICE DAILY 473 mL 3   cyclobenzaprine (FLEXERIL) 5 MG tablet Take 5 mg by mouth 3 (three) times daily as needed for muscle spasms.     DULoxetine (CYMBALTA) 60 MG capsule Take 60 mg by mouth daily.     ferrous sulfate 325 (65 FE) MG EC tablet Take 325 mg by mouth in the morning and at bedtime.     furosemide (LASIX) 40 MG tablet Take 40 mg by mouth 2 (two) times daily.     gabapentin (NEURONTIN) 300 MG capsule Take 300 mg by mouth 3 (three) times daily as needed (pain.).     levOCARNitine (CARNITOR) 330 MG tablet Take 330 mg by mouth 3 (three) times daily.     Multiple Vitamin (MULTIVITAMIN) tablet Take 1 tablet by mouth in the morning.     ondansetron (ZOFRAN) 4 MG tablet TAKE 1 TABLET BY MOUTH EVERY 6 HOURS AS NEEDED FOR NAUSEA OR FOR VOMITING 30 tablet 3   pantoprazole (PROTONIX) 40 MG tablet Take 1 tablet (40 mg total) by mouth daily. 30 minutes before breakfast. Take instead of omeprazole 90 tablet 3   spironolactone (ALDACTONE) 100 MG tablet Take 100 mg by mouth daily.     XIFAXAN 550 MG TABS tablet TAKE 1 TABLET BY MOUTH TWICE DAILY 180 tablet 3   Zinc 50 MG TABS Take 50 mg  by mouth in the morning.     No current facility-administered medications for this visit.    Allergies as of 07/26/2022 - Review Complete 07/17/2022  Allergen Reaction Noted   Chlorthalidone  11/04/2019   Metoprolol  11/04/2019    Family History  Problem Relation Age of Onset   Brain cancer Mother    Diabetes Mother    Diabetes Father    Pulmonary embolism Brother    Liver disease Neg Hx     Social History   Socioeconomic History   Marital status: Single    Spouse name: Not on file   Number of children: Not on file   Years of education: Not on file   Highest education level: Not on file  Occupational History   Occupation: umemployed  Tobacco Use   Smoking status: Never    Passive exposure: Current   Smokeless tobacco: Never  Vaping Use   Vaping Use: Never used  Substance and Sexual Activity   Alcohol use: Never   Drug  use: Never   Sexual activity: Not Currently  Other Topics Concern   Not on file  Social History Narrative   Not on file   Social Determinants of Health   Financial Resource Strain: Not on file  Food Insecurity: Not on file  Transportation Needs: Not on file  Physical Activity: Not on file  Stress: Not on file  Social Connections: Not on file    Review of Systems: Gen: Denies fever, chills, anorexia. Denies fatigue, weakness, weight loss.  CV: Denies chest pain, palpitations, syncope, peripheral edema, and claudication. Resp: Denies dyspnea at rest, cough, wheezing, coughing up blood, and pleurisy. GI: Denies vomiting blood, jaundice, and fecal incontinence.   Denies dysphagia or odynophagia. Derm: Denies rash, itching, dry skin Psych: Denies depression, anxiety, memory loss, confusion. No homicidal or suicidal ideation.  Heme: Denies bruising, bleeding, and enlarged lymph nodes.  Physical Exam: There were no vitals taken for this visit. General:   Alert and oriented. No distress noted. Pleasant and cooperative.  Head:  Normocephalic and  atraumatic. Eyes:  Conjuctiva clear without scleral icterus. Heart:  S1, S2 present without murmurs appreciated. Lungs:  Clear to auscultation bilaterally. No wheezes, rales, or rhonchi. No distress.  Abdomen:  +BS, soft, non-tender and non-distended. No rebound or guarding. No HSM or masses noted. Msk:  Symmetrical without gross deformities. Normal posture. Extremities:  Without edema. Neurologic:  Alert and  oriented x4 Psych:  Normal mood and affect.    Assessment:     Plan:  ***   Aliene Altes, PA-C [facility] 07/26/2022

## 2022-07-26 ENCOUNTER — Other Ambulatory Visit: Payer: Self-pay | Admitting: *Deleted

## 2022-07-26 ENCOUNTER — Ambulatory Visit: Payer: Medicaid Other | Admitting: Gastroenterology

## 2022-07-26 ENCOUNTER — Encounter: Payer: Self-pay | Admitting: Gastroenterology

## 2022-07-26 VITALS — BP 123/83 | HR 76 | Temp 98.0°F | Ht 68.0 in | Wt 283.2 lb

## 2022-07-26 DIAGNOSIS — K7460 Unspecified cirrhosis of liver: Secondary | ICD-10-CM | POA: Diagnosis not present

## 2022-07-26 DIAGNOSIS — R188 Other ascites: Secondary | ICD-10-CM

## 2022-07-26 NOTE — Patient Instructions (Signed)
I am ordering paras to be done no more than twice per week. Please do not take your fluid pills on the day you have the paracentesis.  I would like to see you in 6 weeks!  I am glad you have appointments upcoming with IR!  Please call with any concerns.  Annitta Needs, PhD, ANP-BC [facility]

## 2022-08-02 ENCOUNTER — Ambulatory Visit (HOSPITAL_COMMUNITY)
Admission: RE | Admit: 2022-08-02 | Discharge: 2022-08-02 | Disposition: A | Discharge status: Home / Self Care | Payer: Medicaid Other | Source: Ambulatory Visit | Attending: Gastroenterology | Admitting: Gastroenterology

## 2022-08-02 ENCOUNTER — Encounter (HOSPITAL_COMMUNITY): Payer: Self-pay

## 2022-08-02 DIAGNOSIS — R188 Other ascites: Secondary | ICD-10-CM | POA: Insufficient documentation

## 2022-08-02 LAB — BODY FLUID CELL COUNT WITH DIFFERENTIAL
Eos, Fluid: 0 %
Lymphs, Fluid: 22 %
Monocyte-Macrophage-Serous Fluid: 72 % (ref 50–90)
Neutrophil Count, Fluid: 6 % (ref 0–25)
Total Nucleated Cell Count, Fluid: 242 cu mm (ref 0–1000)

## 2022-08-02 MED ORDER — ALBUMIN HUMAN 25 % IV SOLN
INTRAVENOUS | Status: AC
  Administered 2022-08-02: 50 g via INTRAVENOUS
  Filled 2022-08-02: qty 200

## 2022-08-02 MED ORDER — ALBUMIN HUMAN 25 % IV SOLN
0.0000 g | Freq: Once | INTRAVENOUS | Status: AC
  Filled 2022-08-02: qty 400

## 2022-08-02 NOTE — Progress Notes (Signed)
Patient tolerated right sided paracentesis and 50G of IV albumin well today and 5 Liters of yellow ascites removed with labs collected and sent for processing. PT verbalized understanding of discharge instructions and ambulatory at departure with no acute distress noted.

## 2022-08-02 NOTE — Procedures (Addendum)
   US guided RLQ paracentesis 5 L max per MD-yellow fluid Sent for labs per MD  Tolerated well  EBL: scant

## 2022-08-03 ENCOUNTER — Ambulatory Visit (HOSPITAL_COMMUNITY): Admission: RE | Admit: 2022-08-03 | Payer: Medicaid Other | Source: Ambulatory Visit

## 2022-08-03 ENCOUNTER — Other Ambulatory Visit (HOSPITAL_COMMUNITY)
Admission: RE | Admit: 2022-08-03 | Discharge: 2022-08-03 | Disposition: A | Discharge status: Home / Self Care | Payer: Medicaid Other | Source: Ambulatory Visit | Attending: Interventional Radiology | Admitting: Interventional Radiology

## 2022-08-03 DIAGNOSIS — K7581 Nonalcoholic steatohepatitis (NASH): Secondary | ICD-10-CM | POA: Insufficient documentation

## 2022-08-03 LAB — CBC
HCT: 32.2 % — ABNORMAL LOW (ref 39.0–52.0)
Hemoglobin: 10.4 g/dL — ABNORMAL LOW (ref 13.0–17.0)
MCH: 29.8 pg (ref 26.0–34.0)
MCHC: 32.3 g/dL (ref 30.0–36.0)
MCV: 92.3 fL (ref 80.0–100.0)
Platelets: 88 10*3/uL — ABNORMAL LOW (ref 150–400)
RBC: 3.49 MIL/uL — ABNORMAL LOW (ref 4.22–5.81)
RDW: 14.6 % (ref 11.5–15.5)
WBC: 5.3 10*3/uL (ref 4.0–10.5)
nRBC: 0 % (ref 0.0–0.2)

## 2022-08-03 LAB — COMPREHENSIVE METABOLIC PANEL
ALT: 34 U/L (ref 0–44)
AST: 45 U/L — ABNORMAL HIGH (ref 15–41)
Albumin: 3.6 g/dL (ref 3.5–5.0)
Alkaline Phosphatase: 120 U/L (ref 38–126)
Anion gap: 8 (ref 5–15)
BUN: 23 mg/dL — ABNORMAL HIGH (ref 6–20)
CO2: 22 mmol/L (ref 22–32)
Calcium: 8.9 mg/dL (ref 8.9–10.3)
Chloride: 95 mmol/L — ABNORMAL LOW (ref 98–111)
Creatinine, Ser: 1.49 mg/dL — ABNORMAL HIGH (ref 0.61–1.24)
GFR, Estimated: 58 mL/min — ABNORMAL LOW (ref >60)
Glucose, Bld: 85 mg/dL (ref 70–99)
Potassium: 4.9 mmol/L (ref 3.5–5.1)
Sodium: 125 mmol/L — ABNORMAL LOW (ref 135–145)
Total Bilirubin: 1.1 mg/dL (ref 0.3–1.2)
Total Protein: 7.1 g/dL (ref 6.5–8.1)

## 2022-08-03 LAB — PATHOLOGIST SMEAR REVIEW

## 2022-08-03 LAB — PROTIME-INR
INR: 1.4 — ABNORMAL HIGH (ref 0.8–1.2)
Prothrombin Time: 16.7 seconds — ABNORMAL HIGH (ref 11.4–15.2)

## 2022-08-04 DIAGNOSIS — F419 Anxiety disorder, unspecified: Secondary | ICD-10-CM

## 2022-08-04 HISTORY — DX: Anxiety disorder, unspecified: F41.9

## 2022-08-07 ENCOUNTER — Ambulatory Visit (HOSPITAL_COMMUNITY)
Admission: RE | Admit: 2022-08-07 | Discharge: 2022-08-07 | Disposition: A | Discharge status: Home / Self Care | Payer: Medicaid Other | Source: Ambulatory Visit | Attending: Interventional Radiology | Admitting: Interventional Radiology

## 2022-08-07 ENCOUNTER — Other Ambulatory Visit (HOSPITAL_COMMUNITY): Payer: Medicaid Other

## 2022-08-07 ENCOUNTER — Encounter (HOSPITAL_COMMUNITY): Payer: Self-pay

## 2022-08-07 ENCOUNTER — Ambulatory Visit (HOSPITAL_COMMUNITY)
Admission: RE | Admit: 2022-08-07 | Discharge: 2022-08-07 | Disposition: A | Discharge status: Home / Self Care | Payer: Medicaid Other | Source: Ambulatory Visit | Attending: Gastroenterology | Admitting: Gastroenterology

## 2022-08-07 DIAGNOSIS — K7581 Nonalcoholic steatohepatitis (NASH): Secondary | ICD-10-CM | POA: Insufficient documentation

## 2022-08-07 DIAGNOSIS — R188 Other ascites: Secondary | ICD-10-CM | POA: Diagnosis present

## 2022-08-07 LAB — BODY FLUID CELL COUNT WITH DIFFERENTIAL
Eos, Fluid: 0 %
Lymphs, Fluid: 15 %
Monocyte-Macrophage-Serous Fluid: 79 % (ref 50–90)
Neutrophil Count, Fluid: 6 % (ref 0–25)
Total Nucleated Cell Count, Fluid: 243 cu mm (ref 0–1000)

## 2022-08-07 MED ORDER — ALBUMIN HUMAN 25 % IV SOLN
0.0000 g | Freq: Once | INTRAVENOUS | Status: AC
  Administered 2022-08-07: 50 g via INTRAVENOUS
  Filled 2022-08-07: qty 400

## 2022-08-07 MED ORDER — ALBUMIN HUMAN 25 % IV SOLN
INTRAVENOUS | Status: AC
  Filled 2022-08-07: qty 200

## 2022-08-07 MED ORDER — IOHEXOL 350 MG/ML SOLN
100.0000 mL | Freq: Once | INTRAVENOUS | Status: AC | PRN
  Administered 2022-08-07: 100 mL via INTRAVENOUS

## 2022-08-07 NOTE — Progress Notes (Signed)
Patient tolerated left sided paracentesis procedure and 50G of IV albumin well today and 5 Liters of clear yellow ascites removed with labs collected and taken to lab for processing by Remo Lipps, [place] at [phone]. PT verbalized understanding of discharge instructions and ambulatory at discharge from ultrasound with no acute distress noted. CT supervisor made aware that patient has 20G IV in the right [facility] accessed for his CT appointment after his paracentesis procedure today.

## 2022-08-09 ENCOUNTER — Ambulatory Visit (INDEPENDENT_AMBULATORY_CARE_PROVIDER_SITE_OTHER): Payer: Medicaid Other | Admitting: Clinical

## 2022-08-09 DIAGNOSIS — F0630 Mood disorder due to known physiological condition, unspecified: Secondary | ICD-10-CM | POA: Diagnosis not present

## 2022-08-09 DIAGNOSIS — F411 Generalized anxiety disorder: Secondary | ICD-10-CM | POA: Diagnosis not present

## 2022-08-09 NOTE — Progress Notes (Signed)
IN PERSON  I connected with Alejandro Porter on 08/09/22 at 11:00 AM EST in person and verified that I am speaking with the correct person using two identifiers.  Location: Patient: Office Provider: Office   I discussed the limitations of evaluation and management by telemedicine and the availability of in person appointments. The patient expressed understanding and agreed to proceed. ( IN PERSON_)   THERAPIST PROGRESS NOTE   Session Time: 11:00 AM- 11:45 AM   Participation Level: Active   Behavioral Response: CasualAlertAnxious   Type of Therapy: Individual Therapy   Treatment Goals addressed: Coping for anxiety and depression   Interventions: CBT, Motivational Interviewing, Strength-based and Supportive   Summary: Alejandro Porter is a 47 y.o. 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   The OPT therapist worked with the patient for his ongoing OPT treatment. The OPT therapist utilized Motivational Interviewing to assist in creating therapeutic repore. The patient in the session was engaged and work in collaboration giving feedback about his triggers and symptoms over the past few weeks. The patient spoke about this physical health condition with his stage 2 liver diease. The OPT therapist utilized Cognitive Behavioral Therapy through cognitive restructuring as well as worked with the patient on coping strategies to assist in management of his diagnoses as well as his thought process and positive thinking. The OPT therapist worked with the patient on managing interactions in the home as the patient lives with his Father age 1 and they are frequently in conflict. The OPT therapist worked with the patient on coping strategies.     Suicidal/Homicidal: Nowithout intent/plan   Therapist Response: The OPT therapist worked with the patient for the patients scheduled session. The patient was engaged in his session and gave feedback in relation to triggers, symptoms,  and behavior responses over the past few weeks.The patient reviewed his current schedule and routine throughout the week. The OPT therapist worked with the patient utilizing an in session Cognitive Behavioral Therapy exercise. The patient was responsive in the session and verbalized, "I am going to try the walking away cause I do not want to argue with my Dad and it stresses me out and I can't take it". The patient spoke about his focus in staying consistent with his exercising. The OPT therapist overviewed upcoming listed appointments as listed in the patient [name].The patient overviewed his general health and attention to his basic needs in the areas of sleep, eating habits, physical exercise, and hygiene.  The OPT therapist gauged with the patient his levels of Anxiety over the past few weeks.The patient spoke about waiting to hear from surgeon on March 11th about a procedure to help the patient with decreasing his fluid retention due to his liver disease.The OPT therapist will continue treatment work with the patient in his next scheduled session.    Plan: Return again in 3 weeks.   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 The patient was provided an opportunity to ask questions and all were answered. The patient agreed with the plan and demonstrated an understanding of the instructions.   The patient was advised to call back or seek an in-person evaluation if the symptoms worsen or if the  condition fails to improve as anticipated.   I provided 45 minutes of face-to-face time during this encounter.   Lennox Grumbles, LCSW   08/09/2022

## 2022-08-10 ENCOUNTER — Encounter (HOSPITAL_COMMUNITY): Payer: Self-pay

## 2022-08-10 ENCOUNTER — Ambulatory Visit (HOSPITAL_COMMUNITY)
Admission: RE | Admit: 2022-08-10 | Discharge: 2022-08-10 | Disposition: A | Discharge status: Home / Self Care | Payer: Medicaid Other | Source: Ambulatory Visit | Attending: Gastroenterology | Admitting: Gastroenterology

## 2022-08-10 DIAGNOSIS — R188 Other ascites: Secondary | ICD-10-CM | POA: Insufficient documentation

## 2022-08-10 LAB — BODY FLUID CELL COUNT WITH DIFFERENTIAL
Eos, Fluid: 0 %
Lymphs, Fluid: 53 %
Monocyte-Macrophage-Serous Fluid: 31 % — ABNORMAL LOW (ref 50–90)
Neutrophil Count, Fluid: 16 % (ref 0–25)
Total Nucleated Cell Count, Fluid: 251 cu mm (ref 0–1000)

## 2022-08-10 MED ORDER — ALBUMIN HUMAN 25 % IV SOLN
INTRAVENOUS | Status: AC
  Administered 2022-08-10: 50 g via INTRAVENOUS
  Filled 2022-08-10: qty 200

## 2022-08-10 MED ORDER — ALBUMIN HUMAN 25 % IV SOLN
0.0000 g | Freq: Once | INTRAVENOUS | Status: AC
  Filled 2022-08-10: qty 400

## 2022-08-10 NOTE — Procedures (Signed)
PROCEDURE SUMMARY:  Successful ultrasound guided paracentesis from the right lower quadrant.  Yielded 5 L of clear yellow fluid.  No immediate complications.  The patient tolerated the procedure well.   Specimen sent for labs.  EBL < 2 mL  The patient has previously been formally evaluated by the [facility] and is being actively followed for potential future intervention.  Patient scheduled for tele-visit with Dr. Serafina Royals 08/14/22 to discuss possible TIPS.    Soyla Dryer, [place] [phone] 08/10/2022, 11:13 AM

## 2022-08-10 NOTE — Progress Notes (Signed)
Patient tolerated right sided paracentesis and 50 G of IV albumin well today. 5 Liters of clear yellow ascites removed with labs collected and sent for processing by Remo Lipps RN at 1015 to lab. Pt verbalized understanding of discharge instructions and ambulatory at departure with no acute distress noted.

## 2022-08-14 ENCOUNTER — Ambulatory Visit (HOSPITAL_COMMUNITY)
Admission: RE | Admit: 2022-08-14 | Discharge: 2022-08-14 | Disposition: A | Discharge status: Home / Self Care | Payer: Medicaid Other | Source: Ambulatory Visit | Attending: Gastroenterology | Admitting: Gastroenterology

## 2022-08-14 ENCOUNTER — Encounter (HOSPITAL_COMMUNITY): Payer: Self-pay

## 2022-08-14 ENCOUNTER — Ambulatory Visit
Admission: RE | Admit: 2022-08-14 | Discharge: 2022-08-14 | Disposition: A | Discharge status: Home / Self Care | Payer: Medicaid Other | Source: Ambulatory Visit | Attending: Interventional Radiology | Admitting: Interventional Radiology

## 2022-08-14 ENCOUNTER — Encounter: Payer: Self-pay | Admitting: *Deleted

## 2022-08-14 DIAGNOSIS — R188 Other ascites: Secondary | ICD-10-CM | POA: Diagnosis present

## 2022-08-14 DIAGNOSIS — K7581 Nonalcoholic steatohepatitis (NASH): Secondary | ICD-10-CM

## 2022-08-14 HISTORY — PX: IR RADIOLOGIST EVAL & MGMT: IMG5224

## 2022-08-14 LAB — BODY FLUID CELL COUNT WITH DIFFERENTIAL
Eos, Fluid: 0 %
Lymphs, Fluid: 27 %
Monocyte-Macrophage-Serous Fluid: 72 % (ref 50–90)
Neutrophil Count, Fluid: 1 % (ref 0–25)
Total Nucleated Cell Count, Fluid: 156 cu mm (ref 0–1000)

## 2022-08-14 MED ORDER — ALBUMIN HUMAN 25 % IV SOLN
0.0000 g | Freq: Once | INTRAVENOUS | Status: AC
  Filled 2022-08-14: qty 400

## 2022-08-14 MED ORDER — ALBUMIN HUMAN 25 % IV SOLN
INTRAVENOUS | Status: AC
  Administered 2022-08-14: 50 g via INTRAVENOUS
  Filled 2022-08-14: qty 200

## 2022-08-14 NOTE — Progress Notes (Signed)
Reason for follow up: Patient presents via virtual telephone visit today for portal hypertension, consideration of TIPS, interval workup   Referring Physician(s): Hurshel Keys, DO, Roseanne Kaufman, NP, Roosevelt Locks, NP    History of Present Illness: Initial HPI (07/19/22):  Alejandro Porter is a 47 y.o. 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     Etiology of cirrhosis: MASH Initially diagnosed: 2021 # of paracentesis in last month: 7 # of paracentesis in last 2 months: 8 History of hepatic hydrothorax:  no History of hepatic encephalopathy: yes   Prior evaluation for liver transplant: Undergoing evaluation with [facility] currently, deemed not a candidate per patient due to transportation/social support factors History of hepatocellular carcinoma: no, 2 LIRADS 3 masses   Prior esophagogastroduodenoscopy/intervention: 03/24/22 - small esophageal varices and portal hypetensive gastropathy, no intervention Current esophageal varices: yes Current gastric varices: no History of hematemesis: no   Current diuretic regimen: furosemide 40 mg BID, spironolactone 100 mg QD Current pharmacologic encephalopathy prophylaxis/treatment: lactulose BID + rifaximin   History of renal dysfunction: yes, CKD IIIa History of hemodialysis: no   History of cardiac dysfunction: no   Other pertinent past medical history: GERD, hypertension, obesity, neuropathy, obstructive sleep apnea  Since his last visit, he underwent CTA BRTO protocol on 08/07/22, obtained outpatient labs, and underwent 4 additional paracenteses including earlier today.  No major changes in health.  He remains tired of so many paracenteses.    Past Medical History:  Diagnosis Date   CKD (chronic kidney disease) stage 2, GFR 60-89 ml/min    GERD (gastroesophageal reflux disease)    H/O colonoscopy    H/O  endoscopy    Hypertension    Liver cirrhosis (HCC)    Neuropathy    OSA (obstructive sleep apnea)    S/P abdominal paracentesis     Past Surgical History:  Procedure Laterality Date   BIOPSY  09/12/2019   Procedure: BIOPSY;  Surgeon: Daneil Dolin, MD;  [facility];  Service: Endoscopy;;   COLONOSCOPY N/A 09/18/2019   Dr. Oneida Alar: 12 colon polyps removed, multiple simple adenomas and some inflammatory polyps, diverticulosis, external and internal hemorrhoids.  Next colonoscopy in 3 years.   COLONOSCOPY WITH PROPOFOL N/A 03/24/2022   Procedure: COLONOSCOPY WITH PROPOFOL;  Surgeon: Eloise Harman, DO;  [facility]: [facility];  Service: Endoscopy;  Laterality: N/A;  1:30pm, asa 3   ESOPHAGEAL BANDING N/A 09/12/2019   Procedure: ESOPHAGEAL BANDING;  Surgeon: Daneil Dolin, MD;  [facility]: [facility];  Service: Endoscopy;  Laterality: N/A;   ESOPHAGOGASTRODUODENOSCOPY N/A 09/12/2019   Dr. Gala Romney: Mild erosive reflux esophagitis.  Schatzki ring status post dilation.  Small grade 1/grade 2 esophageal varices, portal hypertensive gastropathy, hyperplastic gastric polyp, gastric erosions with chronic inactive gastritis on biopsies, no H. pylori.   ESOPHAGOGASTRODUODENOSCOPY (EGD) WITH PROPOFOL N/A 12/16/2020   two short columns of no more than Grade 2 esophageal varices, appearing innocent. Grade 1 varices not apparent today. Multiple large posterior body and antral hyperplastic, hemorrhagic appearing polyps, larges approximately 2.5 cm pedunculated. Portal gastropathy. Normal duodenum. Polypectomy with clips placed. EGD in 18 months.   ESOPHAGOGASTRODUODENOSCOPY (EGD) WITH PROPOFOL N/A 03/24/2022   Procedure: ESOPHAGOGASTRODUODENOSCOPY (EGD) WITH PROPOFOL;  Surgeon: Eloise Harman, DO;  [facility];  Service: Endoscopy;  Laterality: N/A;   IR RADIOLOGIST EVAL & MGMT  07/19/2022   POLYPECTOMY  09/18/2019   Procedure: POLYPECTOMY;  Surgeon: Danie Binder, MD;   [facility];  Service: Endoscopy;;   POLYPECTOMY  12/16/2020   Procedure: POLYPECTOMY;  Surgeon: Daneil Dolin, MD;  [facility];  Service: Endoscopy;;  gastric   POLYPECTOMY  03/24/2022   Procedure: POLYPECTOMY;  Surgeon: Eloise Harman, DO;  [facility];  Service: Endoscopy;;    Allergies: Chlorthalidone and Metoprolol  Medications: Prior to Admission medications   Medication Sig Start Date End Date Taking? Authorizing Provider  apixaban (ELIQUIS) 5 MG TABS tablet Take 1 tablet (5 mg total) by mouth 2 (two) times daily. 04/20/22   Annitta Needs, NP  Cholecalciferol (VITAMIN D3) 125 MCG (5000 UT) TABS Take 5,000 Units by mouth in the morning. 06/17/19   [provider]  ciprofloxacin (CIPRO) 500 MG tablet TAKE 1 TABLET BY MOUTH DAILY 07/12/22   Annitta Needs, NP  CONSTULOSE 10 GM/15ML solution take 30 mls BY MOUTH TWICE DAILY 07/25/22   Annitta Needs, NP  cyclobenzaprine (FLEXERIL) 5 MG tablet Take 5 mg by mouth 3 (three) times daily as needed for muscle spasms. 09/29/20   [provider]  DULoxetine (CYMBALTA) 60 MG capsule Take 60 mg by mouth daily. 05/03/22   [provider]  ferrous sulfate 325 (65 FE) MG EC tablet Take 325 mg by mouth in the morning and at bedtime.    [provider]  furosemide (LASIX) 40 MG tablet Take 40 mg by mouth 2 (two) times daily. 01/23/22   [provider]  gabapentin (NEURONTIN) 300 MG capsule Take 300 mg by mouth 3 (three) times daily as needed (pain.).    [provider]  levOCARNitine (CARNITOR) 330 MG tablet Take 330 mg by mouth 3 (three) times daily. 09/01/20   [provider]  Multiple Vitamin (MULTIVITAMIN) tablet Take 1 tablet by mouth in the morning.    [provider]  ondansetron (ZOFRAN) 4 MG tablet TAKE 1 TABLET BY MOUTH EVERY 6 HOURS AS NEEDED FOR NAUSEA OR FOR VOMITING 05/24/22   Carlan, Chelsea L, NP  pantoprazole (PROTONIX) 40 MG tablet  Take 1 tablet (40 mg total) by mouth daily. 30 minutes before breakfast. Take instead of omeprazole 10/04/21   Annitta Needs, NP  spironolactone (ALDACTONE) 100 MG tablet Take 100 mg by mouth daily.    [provider]  XIFAXAN 550 MG TABS tablet TAKE 1 TABLET BY MOUTH TWICE DAILY 09/13/21   Sherron Monday, NP  Zinc 50 MG TABS Take 50 mg by mouth in the morning.    [provider]     Family History  Problem Relation Age of Onset   Brain cancer Mother    Diabetes Mother    Diabetes Father    Pulmonary embolism Brother    Liver disease Neg Hx     Social History   Socioeconomic History   Marital status: Single    Spouse name: Not on file   Number of children: Not on file   Years of education: Not on file   Highest education level: Not on file  Occupational History   Occupation: umemployed  Tobacco Use   Smoking status: Never    Passive exposure: Current   Smokeless tobacco: Never  Vaping Use   Vaping Use: Never used  Substance and Sexual Activity   Alcohol use: Never   Drug use: Never   Sexual activity: Not Currently  Other Topics Concern   Not  on file  Social History Narrative   Not on file   Social Determinants of Health   Financial Resource Strain: Not on file  Food Insecurity: Not on file  Transportation Needs: Not on file  Physical Activity: Not on file  Stress: Not on file  Social Connections: Not on file     Vital Signs: There were no vitals taken for this visit.  No physical examination was performed in lieu of virtual telephone clinic visit.   Imaging: Prior cross sectional imaging of portal system: CT abdomen 01/02/22  Recanalized periumbilical varices   MR abdomen 04/05/22  Non-occlusive main portal thrombus   US hepatic doppler 04/19/22   CTA BRTO 08/07/22  1. Nonocclusive, chronic appearing thrombus in the main portal vein adherent the posterior wall. 2. The portal system is otherwise widely patent. Small  recanalized paraumbilical vein.   Echocardiogram: 12/11/20  IMPRESSIONS   1. Left ventricular ejection fraction, by estimation, is 55 to 60%. The  left ventricle has normal function. The left ventricle has no regional  wall motion abnormalities. Left ventricular diastolic parameters were  normal.   2. Right ventricular systolic function is normal. The right ventricular  size is normal. There is normal pulmonary artery systolic pressure.   3. The mitral valve is normal in structure. Trivial mitral valve  regurgitation. No evidence of mitral stenosis.   4. The aortic valve is tricuspid. Aortic valve regurgitation is not  visualized. No aortic stenosis is present.   5. The inferior vena cava is normal in size with greater than 50%  respiratory variability, suggesting right atrial pressure of 3 mmHg.   Labs: 12/11-27/23 -->  08/03/22 Creatinine: 1.46 --> 1.49 Total Bilirubin: 1.0 --> 1.1 INR: 1.1 --> 1.4 Sodium: 126 --> 125 Albumin: 3.8 --> 3.6   Child-Pugh = 7 points, class B MELD = 14 (6.0% estimated 3 month mortality) [place] Index of Post-TIPS Survival (FIPS) = -0.06 (overall survival predicted at 1 month 95.8%, 3 months 86.2%, and 6 months 79.9%   Assessment and Plan: Kci Baes is a 47 y.o. 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  He is apparently a poor candidate for transplant due to social factors. This patient could possibly benefit for TIPS creation for: -management of recurrent/refractory ascites -improvement of presumed underlying hepatorenal syndrome -endovascular management of partial portal thrombus, which given chronicity is prone to progression and could thwart future transplant options or require much more difficult recanalization -embolization of recanalized paraumbilical vein which could certainly contribute  to encephalopathy due to portosystemic shunting   He is interested in proceeding and very tired of so many paracenteses.  We had an in depth discussion of the above potential benefits, risks, periprocedural care, and long term expectations after TIPS.  He would like to proceed.  -Plan for TIPS creation, possible variceal embolization, and paracentesis at [facility]. -Please hold Eliquis for 2 doses prior to procedure.  He will likely be able to come off Eliquis indefinitely after TIPS - I will discuss this plan with Roseanne Kaufman.  Electronically Signed: Suzette Battiest 08/14/2022, 8:19 AM   I spent a total of 25 Minutes in virtual telephone clinical consultation, greater than 50% of which was counseling/coordinating care for portal hypertension.

## 2022-08-14 NOTE — Progress Notes (Signed)
Patient tolerated right sided paracentesis and 50 G of IV albumin well today. 5 Liters of clear yellow ascites removed with labs collected and sent for processing. Pt verbalized understanding of discharge instructions and ambulatory at departure with no acute distress noted.

## 2022-08-14 NOTE — Procedures (Signed)
PreOperative Dx: Cirrhosis, ascites Postoperative Dx: Cirrhosis, ascites Procedure:   US guided paracentesis Radiologist:  Katora Fini Anesthesia:  10 ml of1% lidocaine Specimen:  5 L of yellow ascitic fluid EBL:   < 1 ml Complications: None  

## 2022-08-16 ENCOUNTER — Other Ambulatory Visit (HOSPITAL_COMMUNITY): Payer: Self-pay | Admitting: Interventional Radiology

## 2022-08-16 DIAGNOSIS — K7031 Alcoholic cirrhosis of liver with ascites: Secondary | ICD-10-CM

## 2022-08-16 LAB — PATHOLOGIST SMEAR REVIEW

## 2022-08-18 ENCOUNTER — Ambulatory Visit (HOSPITAL_COMMUNITY)
Admission: RE | Admit: 2022-08-18 | Discharge: 2022-08-18 | Disposition: A | Discharge status: Home / Self Care | Payer: Medicaid Other | Source: Ambulatory Visit | Attending: Gastroenterology | Admitting: Gastroenterology

## 2022-08-18 ENCOUNTER — Encounter (HOSPITAL_COMMUNITY): Payer: Self-pay

## 2022-08-18 DIAGNOSIS — R188 Other ascites: Secondary | ICD-10-CM

## 2022-08-18 LAB — BODY FLUID CELL COUNT WITH DIFFERENTIAL
Eos, Fluid: 0 %
Lymphs, Fluid: 18 %
Monocyte-Macrophage-Serous Fluid: 75 % (ref 50–90)
Neutrophil Count, Fluid: 7 % (ref 0–25)
Total Nucleated Cell Count, Fluid: 237 cu mm (ref 0–1000)

## 2022-08-18 MED ORDER — ALBUMIN HUMAN 25 % IV SOLN
0.0000 g | Freq: Once | INTRAVENOUS | Status: AC
  Filled 2022-08-18: qty 400

## 2022-08-18 MED ORDER — ALBUMIN HUMAN 25 % IV SOLN
INTRAVENOUS | Status: AC
  Filled 2022-08-18: qty 100

## 2022-08-18 MED ORDER — ALBUMIN HUMAN 25 % IV SOLN
INTRAVENOUS | Status: AC
  Administered 2022-08-18: 50 g via INTRAVENOUS
  Filled 2022-08-18: qty 100

## 2022-08-18 NOTE — Progress Notes (Signed)
Patient tolerated right sided paracentesis procedure and 50G of IV albumin and 5 Liters of ascites removed with labs collected and sent for processing. Patient verbalized understanding of discharge instructions and ambulatory at departure with no acute distress noted.

## 2022-08-18 NOTE — Procedures (Signed)
PreOperative Dx: Cirrhosis, ascites Postoperative Dx: Cirrhosis, ascites Procedure:   US guided paracentesis Radiologist:  Scottie Metayer Anesthesia:  10 ml of1% lidocaine Specimen:  5 L of yellow ascitic fluid EBL:   < 1 ml Complications: None  

## 2022-08-21 ENCOUNTER — Ambulatory Visit (HOSPITAL_COMMUNITY)
Admission: RE | Admit: 2022-08-21 | Discharge: 2022-08-21 | Disposition: A | Discharge status: Home / Self Care | Payer: Medicaid Other | Source: Ambulatory Visit | Attending: Gastroenterology | Admitting: Gastroenterology

## 2022-08-21 ENCOUNTER — Encounter (HOSPITAL_COMMUNITY): Payer: Self-pay

## 2022-08-21 DIAGNOSIS — R188 Other ascites: Secondary | ICD-10-CM | POA: Insufficient documentation

## 2022-08-21 LAB — BODY FLUID CELL COUNT WITH DIFFERENTIAL
Eos, Fluid: 0 %
Lymphs, Fluid: 45 %
Monocyte-Macrophage-Serous Fluid: 45 % — ABNORMAL LOW (ref 50–90)
Neutrophil Count, Fluid: 10 % (ref 0–25)
Total Nucleated Cell Count, Fluid: 220 cu mm (ref 0–1000)

## 2022-08-21 LAB — GRAM STAIN

## 2022-08-21 MED ORDER — ALBUMIN HUMAN 25 % IV SOLN: 50.0000 g | Freq: Once | INTRAVENOUS | Status: AC

## 2022-08-21 MED ORDER — ALBUMIN HUMAN 25 % IV SOLN
INTRAVENOUS | Status: AC
  Administered 2022-08-21: 50 g via INTRAVENOUS
  Filled 2022-08-21: qty 200

## 2022-08-21 NOTE — Procedures (Signed)
PreOperative Dx: Cirrhosis, ascites Postoperative Dx: Cirrhosis, ascites Procedure:   US guided paracentesis Radiologist:  Tupac Jeffus Anesthesia:  10 ml of1% lidocaine Specimen:  5 L of yellow ascitic fluid EBL:   < 1 ml Complications: None  

## 2022-08-21 NOTE — Progress Notes (Signed)
Paracentesis complete no signs of distress. 5 liters of ascites removed and 50G IV Albumin given.

## 2022-08-24 ENCOUNTER — Ambulatory Visit (HOSPITAL_COMMUNITY): Admission: RE | Admit: 2022-08-24 | Payer: Medicaid Other | Source: Ambulatory Visit

## 2022-08-25 ENCOUNTER — Ambulatory Visit (HOSPITAL_COMMUNITY)
Admission: RE | Admit: 2022-08-25 | Discharge: 2022-08-25 | Disposition: A | Discharge status: Home / Self Care | Payer: Medicaid Other | Source: Ambulatory Visit | Attending: Gastroenterology | Admitting: Gastroenterology

## 2022-08-25 ENCOUNTER — Encounter (HOSPITAL_COMMUNITY): Payer: Self-pay

## 2022-08-25 DIAGNOSIS — R188 Other ascites: Secondary | ICD-10-CM | POA: Insufficient documentation

## 2022-08-25 LAB — BODY FLUID CELL COUNT WITH DIFFERENTIAL
Eos, Fluid: 0 %
Lymphs, Fluid: 36 %
Monocyte-Macrophage-Serous Fluid: 56 % (ref 50–90)
Neutrophil Count, Fluid: 8 % (ref 0–25)
Total Nucleated Cell Count, Fluid: 274 cu mm (ref 0–1000)

## 2022-08-25 MED ORDER — ALBUMIN HUMAN 25 % IV SOLN
INTRAVENOUS | Status: AC
  Administered 2022-08-25: 50 g via INTRAVENOUS
  Filled 2022-08-25: qty 200

## 2022-08-25 MED ORDER — ALBUMIN HUMAN 25 % IV SOLN
0.0000 g | Freq: Once | INTRAVENOUS | Status: AC
  Filled 2022-08-25: qty 400

## 2022-08-25 NOTE — Procedures (Signed)
PreOperative Dx: Cirrhosis, ascites Postoperative Dx: Cirrhosis, ascites Procedure:   US guided paracentesis Radiologist:  Thornton Papas Anesthesia:  10 ml of1% lidocaine Specimen:  5 L of bloody ascitic fluid EBL:   < 1 ml Complications:  None

## 2022-08-25 NOTE — Progress Notes (Signed)
Escorted to [place] suite, procedure explained, consent obtained, prepped and draped in sterile manner. Access obtained at RLQ without complication, 5 L blood tinged fluid retrieved. Tolerated well. Access withdrawn, hemostasis achieved, no hematoma, no bleeding. DC f/u scheduled. Transported to lobby in no obvious distress.

## 2022-08-26 LAB — CULTURE, BODY FLUID W GRAM STAIN -BOTTLE: Culture: NO GROWTH

## 2022-08-29 NOTE — H&P (Signed)
Chief Complaint: Patient was seen in consultation today for portal hypertension; TIPS  Referring Physician(s): Dr. Hurshel Keys  Supervising Physician: Ruthann Cancer  Patient Status: Alejandro Porter - Out-pt  History of Present Illness: Alejandro Porter is a 47 y.o. male with a medical history significant for HTN, chronic kidney disease stage 3a, OSA and metabolic dysfunction-associated steatohepatitis with portal hypertension, esophageal varices, portal hypertensive gastropathy and recurrent ascites. He also has a non-occlusive chronic main portal vein thrombus and a history of encephalopathy.  He has been evaluated for possible liver transplant but considered a poor candidate due to social factors. The patient was referred to Interventional Radiology to discuss portal hypertension treatment/management options and he first met with Dr. Serafina Royals 07/19/22 via tele-health visit.   Dr. Serafina Royals discussed TIPS creation for management of recurrent/refractory ascites and presumed hepatorenal syndrome. Dr. Serafina Royals also discussed endovascular management of partial portal thrombus and embolization of recanalized paraumbilical vein. After discussing the risks, benefits and alternatives, the patient expressed interest in pursuing TIPS creation. Additional labs and imaging studies were ordered and the patient followed up with Dr. Serafina Royals 08/14/22 with a tele-health visit to review the proposed treatment plan. The patient was still in agreement to proceed and it was discussed that the procedure would be done under general anesthesia with overnight observation.    Past Medical History:  Diagnosis Date   Anxiety 08/2022   CKD (chronic kidney disease) stage 2, GFR 60-89 ml/min 05/24/2022   stage 3a   Depression 06/2022   GERD (gastroesophageal reflux disease)    H/O colonoscopy    H/O endoscopy    Headache    Hypertension    Liver cirrhosis    Neuropathy    OSA (obstructive sleep apnea)    not currently using CPAP    S/P abdominal paracentesis     Past Surgical History:  Procedure Laterality Date   BIOPSY  09/12/2019   Procedure: BIOPSY;  Surgeon: Daneil Dolin, MD;  [facility];  Service: Endoscopy;;   COLONOSCOPY N/A 09/18/2019   Dr. Oneida Alar: 12 colon polyps removed, multiple simple adenomas and some inflammatory polyps, diverticulosis, external and internal hemorrhoids.  Next colonoscopy in 3 years.   COLONOSCOPY WITH PROPOFOL N/A 03/24/2022   Procedure: COLONOSCOPY WITH PROPOFOL;  Surgeon: Eloise Harman, DO;  [facility]: [facility];  Service: Endoscopy;  Laterality: N/A;  1:30pm, asa 3   ESOPHAGEAL BANDING N/A 09/12/2019   Procedure: ESOPHAGEAL BANDING;  Surgeon: Daneil Dolin, MD;  [facility];  Service: Endoscopy;  Laterality: N/A;   ESOPHAGOGASTRODUODENOSCOPY N/A 09/12/2019   Dr. Gala Romney: Mild erosive reflux esophagitis.  Schatzki ring status post dilation.  Small grade 1/grade 2 esophageal varices, portal hypertensive gastropathy, hyperplastic gastric polyp, gastric erosions with chronic inactive gastritis on biopsies, no H. pylori.   ESOPHAGOGASTRODUODENOSCOPY (EGD) WITH PROPOFOL N/A 12/16/2020   two short columns of no more than Grade 2 esophageal varices, appearing innocent. Grade 1 varices not apparent today. Multiple large posterior body and antral hyperplastic, hemorrhagic appearing polyps, larges approximately 2.5 cm pedunculated. Portal gastropathy. Normal duodenum. Polypectomy with clips placed. EGD in 18 months.   ESOPHAGOGASTRODUODENOSCOPY (EGD) WITH PROPOFOL N/A 03/24/2022   Procedure: ESOPHAGOGASTRODUODENOSCOPY (EGD) WITH PROPOFOL;  Surgeon: Eloise Harman, DO;  [facility];  Service: Endoscopy;  Laterality: N/A;   IR RADIOLOGIST EVAL & MGMT  07/19/2022   IR RADIOLOGIST EVAL & MGMT  08/14/2022   POLYPECTOMY  09/18/2019   Procedure: POLYPECTOMY;  Surgeon: Danie Binder,  MD;  [facility];  Service: Endoscopy;;   POLYPECTOMY   12/16/2020   Procedure: POLYPECTOMY;  Surgeon: Daneil Dolin, MD;  [facility];  Service: Endoscopy;;  gastric   POLYPECTOMY  03/24/2022   Procedure: POLYPECTOMY;  Surgeon: Eloise Harman, DO;  [facility];  Service: Endoscopy;;    Allergies: Chlorthalidone and Metoprolol  Medications: Prior to Admission medications   Medication Sig Start Date End Date Taking? Authorizing Provider  apixaban (ELIQUIS) 5 MG TABS tablet Take 1 tablet (5 mg total) by mouth 2 (two) times daily. 04/20/22   Annitta Needs, NP  Cholecalciferol (VITAMIN D3) 125 MCG (5000 UT) TABS Take 5,000 Units by mouth in the morning. 06/17/19   [provider]  ciprofloxacin (CIPRO) 500 MG tablet TAKE 1 TABLET BY MOUTH DAILY 07/12/22   Annitta Needs, NP  CONSTULOSE 10 GM/15ML solution take 30 mls BY MOUTH TWICE DAILY 07/25/22   Annitta Needs, NP  cyclobenzaprine (FLEXERIL) 5 MG tablet Take 5 mg by mouth 3 (three) times daily as needed for muscle spasms. 09/29/20   [provider]  DULoxetine (CYMBALTA) 60 MG capsule Take 60 mg by mouth daily. 05/03/22   [provider]  ferrous sulfate 325 (65 FE) MG EC tablet Take 325 mg by mouth in the morning and at bedtime.    [provider]  furosemide (LASIX) 40 MG tablet Take 40 mg by mouth 2 (two) times daily. 01/23/22   [provider]  gabapentin (NEURONTIN) 300 MG capsule Take 300 mg by mouth 3 (three) times daily as needed (pain.).    [provider]  levOCARNitine (CARNITOR) 330 MG tablet Take 330 mg by mouth 3 (three) times daily. 09/01/20   [provider]  Multiple Vitamin (MULTIVITAMIN) tablet Take 1 tablet by mouth in the morning.    [provider]  ondansetron (ZOFRAN) 4 MG tablet TAKE 1 TABLET BY MOUTH EVERY 6 HOURS AS NEEDED FOR NAUSEA OR FOR VOMITING 05/24/22   Carlan, Chelsea L, NP  pantoprazole (PROTONIX) 40 MG tablet Take 1 tablet (40 mg total) by mouth daily. 30 minutes before  breakfast. Take instead of omeprazole 10/04/21   Annitta Needs, NP  spironolactone (ALDACTONE) 100 MG tablet Take 100 mg by mouth daily.    [provider]  XIFAXAN 550 MG TABS tablet TAKE 1 TABLET BY MOUTH TWICE DAILY 09/13/21   Sherron Monday, NP  Zinc 50 MG TABS Take 50 mg by mouth in the morning.    [provider]     Family History  Problem Relation Age of Onset   Brain cancer Mother    Diabetes Mother    Diabetes Father    Pulmonary embolism Brother    Liver disease Neg Hx     Social History   Socioeconomic History   Marital status: Single    Spouse name: Not on file   Number of children: Not on file   Years of education: Not on file   Highest education level: Not on file  Occupational History   Occupation: umemployed  Tobacco Use   Smoking status: Never    Passive exposure: Current   Smokeless tobacco: Never  Vaping Use   Vaping Use: Never used  Substance and Sexual Activity   Alcohol use: Not Currently   Drug use: Never   Sexual activity: Not Currently  Other Topics Concern   Not on file  Social History Narrative   Not  on file   Social Determinants of Health   Financial Resource Strain: Not on file  Food Insecurity: Not on file  Transportation Needs: Not on file  Physical Activity: Not on file  Stress: Not on file  Social Connections: Not on file    Review of Systems: A 12 point ROS discussed and pertinent positives are indicated in the HPI above.  All other systems are negative.  Review of Systems  Constitutional:  Negative for appetite change and fatigue.  Respiratory:  Negative for cough and shortness of breath.   Cardiovascular:  Positive for leg swelling. Negative for chest pain.  Gastrointestinal:  Positive for abdominal distention. Negative for abdominal pain, diarrhea, nausea and vomiting.  Musculoskeletal:  Positive for neck pain.  Neurological:  Positive for headaches. Negative for dizziness.    Vital Signs: Temp:  98.2, BP 135/96, HR 98, RR 20, 98% on room air   Physical Exam Constitutional:      General: He is not in acute distress.    Appearance: He is not ill-appearing.  HENT:     Mouth/Throat:     Mouth: Mucous membranes are moist.     Pharynx: Oropharynx is clear.  Cardiovascular:     Rate and Rhythm: Normal rate and regular rhythm.     Pulses: Normal pulses.     Heart sounds: Normal heart sounds.  Pulmonary:     Effort: Pulmonary effort is normal.     Breath sounds: Normal breath sounds.  Abdominal:     General: Bowel sounds are normal. There is distension.  Musculoskeletal:     Right lower leg: Edema present.     Left lower leg: Edema present.  Skin:    General: Skin is warm and dry.  Neurological:     Mental Status: He is alert and oriented to person, place, and time.  Psychiatric:        Mood and Affect: Mood normal.        Behavior: Behavior normal.        Thought Content: Thought content normal.        Judgment: Judgment normal.     Imaging: US Paracentesis  Result Date: 08/30/2022 INDICATION: Cirrhosis; recurrent ascites; TIPS scheduled for 09/04/22 EXAM: ULTRASOUND GUIDED RLQ PARACENTESIS MEDICATIONS: 10 cc 1% lidocaine. COMPLICATIONS: None immediate. PROCEDURE: Informed written consent was obtained from the patient after a discussion of the risks, benefits and alternatives to treatment. A timeout was performed prior to the initiation of the procedure. Initial ultrasound scanning demonstrates a large amount of ascites within the right lower abdominal quadrant. The right lower abdomen was prepped and draped in the usual sterile fashion. 1% lidocaine was used for local anesthesia. Following this, a 19 gauge, 7-cm, Yueh catheter was introduced. An ultrasound image was saved for documentation purposes. The paracentesis was performed. The catheter was removed and a dressing was applied. The patient tolerated the procedure well without immediate post procedural complication. Patient  received post-procedure intravenous albumin; see nursing notes for details. FINDINGS: A total of approximately 5 liters (max) of blood tinged fluid was removed. Samples were sent to the laboratory as requested by the clinical team. IMPRESSION: Successful ultrasound-guided paracentesis yielding 5 liters of peritoneal fluid. Read by Lavonia Drafts [facility] Electronically Signed   By: Lavonia Dana M.D.   On: 08/30/2022 10:49   US Paracentesis  Result Date: 08/25/2022 INDICATION: Cirrhosis of the liver, ascites EXAM: ULTRASOUND GUIDED DIAGNOSTIC AND THERAPEUTIC PARACENTESIS MEDICATIONS: None. COMPLICATIONS: None immediate. PROCEDURE: Informed written consent  was obtained from the patient after a discussion of the risks, benefits and alternatives to treatment. A timeout was performed prior to the initiation of the procedure. Initial ultrasound scanning demonstrates a large amount of ascites within the right lower abdominal quadrant. The right lower abdomen was prepped and draped in the usual sterile fashion. 1% lidocaine was used for local anesthesia. Following this, a 5 [place] Yueh catheter was introduced. An ultrasound image was saved for documentation purposes. The paracentesis was performed. The catheter was removed and a dressing was applied. The patient tolerated the procedure well without immediate post procedural complication. Patient received post-procedure intravenous albumin; see nursing notes for details. FINDINGS: A total of approximately 5 L of bloody ascitic fluid was removed. Samples were sent to the laboratory as requested by the clinical team. IMPRESSION: Successful ultrasound-guided paracentesis yielding 5 liters of peritoneal fluid. Electronically Signed   By: Lavonia Dana M.D.   On: 08/25/2022 10:37   US Paracentesis  Result Date: 08/21/2022 INDICATION: Cirrhosis, ascites EXAM: ULTRASOUND GUIDED DIAGNOSTIC AND THERAPEUTIC PARACENTESIS MEDICATIONS: None. COMPLICATIONS: None immediate. PROCEDURE:  Informed written consent was obtained from the patient after a discussion of the risks, benefits and alternatives to treatment. A timeout was performed prior to the initiation of the procedure. Initial ultrasound scanning demonstrates a large amount of ascites within the LEFT lower abdominal quadrant. The right lower abdomen was prepped and draped in the usual sterile fashion. 1% lidocaine was used for local anesthesia. Following this, a 5 [place] Yueh catheter was introduced. An ultrasound image was saved for documentation purposes. The paracentesis was performed. The catheter was removed and a dressing was applied. The patient tolerated the procedure well without immediate post procedural complication. Patient received post-procedure intravenous albumin; see nursing notes for details. FINDINGS: A total of approximately 5 L of yellow ascitic fluid was removed. Samples were sent to the laboratory as requested by the clinical team. IMPRESSION: Successful ultrasound-guided paracentesis yielding 5 liters of peritoneal fluid. Electronically Signed   By: Lavonia Dana M.D.   On: 08/21/2022 11:26   US Paracentesis  Result Date: 08/18/2022 INDICATION: Cirrhosis, ascites EXAM: ULTRASOUND GUIDED DIAGNOSTIC AND THERAPEUTIC PARACENTESIS MEDICATIONS: None. COMPLICATIONS: None immediate. PROCEDURE: Informed written consent was obtained from the patient after a discussion of the risks, benefits and alternatives to treatment. A timeout was performed prior to the initiation of the procedure. Initial ultrasound scanning demonstrates a large amount of ascites within the right lower abdominal quadrant. The right lower abdomen was prepped and draped in the usual sterile fashion. 1% lidocaine was used for local anesthesia. Following this, a 19 gauge, 10-cm, Yueh catheter was introduced. An ultrasound image was saved for documentation purposes. The paracentesis was performed. The catheter was removed and a dressing was applied. The  patient tolerated the procedure well without immediate post procedural complication. Patient received post-procedure intravenous albumin; see nursing notes for details. FINDINGS: A total of approximately 5 L of yellow ascitic fluid was removed. Samples were sent to the laboratory as requested by the clinical team. IMPRESSION: Successful ultrasound-guided paracentesis yielding 5 liters of peritoneal fluid. Electronically Signed   By: Lavonia Dana M.D.   On: 08/18/2022 11:55   IR Radiologist Eval & Mgmt  Result Date: 08/14/2022 EXAM: ESTABLISHED PATIENT OFFICE VISIT CHIEF COMPLAINT: See Epic note. HISTORY OF PRESENT ILLNESS: See Epic note. REVIEW OF SYSTEMS: See Epic note. PHYSICAL EXAMINATION: See Epic note. ASSESSMENT AND PLAN: See Epic note. Ruthann Cancer, MD [facility] Electronically Signed  By: Ruthann Cancer M.D.   On: 08/14/2022 14:13   US Paracentesis  Result Date: 08/14/2022 INDICATION: Cirrhosis, ascites EXAM: ULTRASOUND GUIDED DIAGNOSTIC AND THERAPEUTIC PARACENTESIS MEDICATIONS: None. COMPLICATIONS: None immediate. PROCEDURE: Informed written consent was obtained from the patient after a discussion of the risks, benefits and alternatives to treatment. A timeout was performed prior to the initiation of the procedure. Initial ultrasound scanning demonstrates a large amount of ascites within the right lower abdominal quadrant. The right lower abdomen was prepped and draped in the usual sterile fashion. 1% lidocaine was used for local anesthesia. Following this, a 19 gauge, 7-cm, Yueh catheter was introduced. An ultrasound image was saved for documentation purposes. The paracentesis was performed. The catheter was removed and a dressing was applied. The patient tolerated the procedure well without immediate post procedural complication. Patient received post-procedure intravenous albumin; see nursing notes for details. FINDINGS: A total of  approximately 5 L of yellow ascitic fluid was removed. Samples were sent to the laboratory as requested by the clinical team. IMPRESSION: Successful ultrasound-guided paracentesis yielding 5 liters of peritoneal fluid. Electronically Signed   By: Lavonia Dana M.D.   On: 08/14/2022 11:44   US Paracentesis  Result Date: 08/10/2022 INDICATION: Patient with a history of cirrhosis with recurrent large volume ascites. Interventional Radiology asked to perform a diagnostic and therapeutic paracentesis. 5L max EXAM: ULTRASOUND GUIDED PARACENTESIS MEDICATIONS: 1% lidocaine 10 ml. COMPLICATIONS: None immediate. PROCEDURE: Informed written consent was obtained from the patient after a discussion of the risks, benefits and alternatives to treatment. A timeout was performed prior to the initiation of the procedure. Initial ultrasound scanning demonstrates a large amount of ascites within the right lower abdominal quadrant. The right lower abdomen was prepped and draped in the usual sterile fashion. 1% lidocaine was used for local anesthesia. Following this, a 19 gauge, 7-cm Yueh catheter was introduced. An ultrasound image was saved for documentation purposes. The paracentesis was performed. The catheter was removed and a dressing was applied. The patient tolerated the procedure well without immediate post procedural complication. Patient received post-procedure intravenous albumin; see nursing notes for details. FINDINGS: A total of approximately 5 L of clear yellow fluid was removed. Samples were sent to the laboratory as requested by the clinical team. IMPRESSION: Successful ultrasound-guided paracentesis yielding 5 L liters of peritoneal fluid. Read by: Soyla Dryer, NP PLAN: The patient has previously been formally evaluated by the [facility] Interventional Radiology Portal Hypertension [facility] and is being actively followed for potential future intervention. Patient scheduled for tele-visit with Dr. Serafina Royals 08/14/22 to  discuss possible TIPS. Electronically Signed   By: Titus Dubin M.D.   On: 08/10/2022 11:48   CT Angio Abd/Pel w/ and/or w/o  Result Date: 08/07/2022 CLINICAL DATA:  47 year old male with history of nonalcoholic steatohepatitis cirrhosis with refractory ascites, possible portal thrombus. EXAM: CTA ABDOMEN AND PELVIS WITHOUT AND WITH CONTRAST TECHNIQUE: Multidetector CT imaging of the abdomen and pelvis was performed using the standard protocol during bolus administration of intravenous contrast. Multiplanar reconstructed images and MIPs were obtained and reviewed to evaluate the vascular anatomy. RADIATION DOSE REDUCTION: This exam was performed according to the departmental dose-optimization program which includes automated exposure control, adjustment of the mA and/or kV according to patient size and/or use of iterative reconstruction technique. CONTRAST:  195mL OMNIPAQUE IOHEXOL 350 MG/ML SOLN COMPARISON:  04/05/2022, 01/02/2022, 08/23/2020 FINDINGS: VASCULAR Aorta: Normal caliber aorta without aneurysm, dissection, vasculitis or significant stenosis. Mild scattered atherosclerotic calcifications. Celiac: Patent without evidence of aneurysm, dissection,  vasculitis or significant stenosis. Left gastric artery arises directly from the aorta just proximal celiac. SMA: Patent without evidence of aneurysm, dissection, vasculitis or significant stenosis. Renals: Single bilateral renal arteries are patent without evidence of aneurysm, dissection, vasculitis, fibromuscular dysplasia or significant stenosis. IMA: Patent without evidence of aneurysm, dissection, vasculitis or significant stenosis. Inflow: Patent without evidence of aneurysm, dissection, vasculitis or significant stenosis. Proximal Outflow: Bilateral common femoral and visualized portions of the superficial and profunda femoral arteries are patent without evidence of aneurysm, dissection, vasculitis or significant stenosis. Veins: The hepatic veins  are poorly evaluated, however appear patent. The portal system is patent with wall adherent, chronic appearing thrombus about the posterior aspect of the main portal vein. The splenic and superior mesenteric veins are widely patent. No evidence of significant splenorenal shunt. No significant gastroesophageal varices. Small recanalized paraumbilical vein. No evidence of ectopic varices. The renal veins are patent bilaterally in standard anatomic configuration. No evidence of iliocaval thrombosis or anomaly. Review of the MIP images confirms the above findings. NON-VASCULAR Lower chest: Partially visualized rounded consolidative opacities in a peribronchovascular distribution in the left lower lobe and lingula. Mild left lower lobe and lingular subsegmental atelectasis. Hepatobiliary: Shrunken right lobe with hypertrophied left and caudate lobes. Nodular contour. The previously described LI-RADS 3 lesions in the right lobe of the liver are not conspicuous on this study. Gallbladder is present with a single layering calcified gallstone in the infundibulum measuring approximately 5 mm. No gallbladder wall thickening or pericholecystic fluid. No intra or extrahepatic biliary ductal dilation. Pancreas: Unremarkable. No pancreatic ductal dilatation or surrounding inflammatory changes. Spleen: Enlarged measuring approximately 16 cm in craniocaudal dimension. No focal masses. Adrenals/Urinary Tract: Similar appearing fat containing left adrenal adenoma measuring up to 4.5 cm in maximum axial dimensions, previously characterized on MRI as benign. Adrenal glands are otherwise unremarkable. Kidneys are normal, without renal calculi, focal lesion, or hydronephrosis. Bladder is unremarkable. Stomach/Bowel: Stomach is within normal limits. Appendix appears normal. No evidence of bowel wall thickening, distention, or inflammatory changes. Lymphatic: No abdominopelvic lymphadenopathy. Reproductive: Prostate is unremarkable. Other:  Large volume ascites. Umbilical hernia present containing ascites. Bilateral inguinal hernias containing ascites. Musculoskeletal: No acute or significant osseous findings. IMPRESSION: VASCULAR 1. Nonocclusive, chronic appearing thrombus in the main portal vein adherent the posterior wall. 2. The portal system is otherwise widely patent. Small recanalized paraumbilical vein. 3.  Aortic Atherosclerosis (ICD10-I70.0). NON-VASCULAR 1. Multifocal rounded consolidative opacities about the left lower lobe and lingula, most compatible infectious/inflammatory etiology of uncertain chronicity. 2. Morphologic changes of cirrhosis and stigmata portal hypertension including splenomegaly and large volume ascites. 3. Previously described LI-RADS 3 lesions in the right lobe of the liver are not conspicuous on this study. Ruthann Cancer, MD [facility] Electronically Signed   By: Ruthann Cancer M.D.   On: 08/07/2022 13:33   US Paracentesis  Result Date: 08/07/2022 INDICATION: Ascites. EXAM: ULTRASOUND GUIDED therapeutic and diagnostic PARACENTESIS MEDICATIONS: None. COMPLICATIONS: None immediate. PROCEDURE: Informed written consent was obtained from the patient after a discussion of the risks, benefits and alternatives to treatment. A timeout was performed prior to the initiation of the procedure. Initial ultrasound scanning demonstrates a large amount of ascites within the left lower abdominal quadrant. The right lower abdomen was prepped and draped in the usual sterile fashion. 1% lidocaine was used for local anesthesia. Following this, a paracentesis catheter was introduced. An ultrasound image was saved for documentation purposes. The paracentesis was performed. The catheter was removed  and a dressing was applied. The patient tolerated the procedure well without immediate post procedural complication. Patient received post-procedure intravenous albumin; see nursing  notes for details. FINDINGS: A total of approximately 5 L of serous fluid was removed. Samples were sent to the laboratory as requested by the clinical team. IMPRESSION: Successful ultrasound-guided paracentesis yielding 5 liters of peritoneal fluid. Electronically Signed   By: Marijo Conception M.D.   On: 08/07/2022 11:12    Labs:  CBC: Recent Labs    03/15/22 1053 05/15/22 0801 08/03/22 1102 09/04/22 0559  WBC 8.8 6.0 5.3 4.2  HGB 13.1* 12.5* 10.4* 10.8*  HCT 37.4* 36.5* 32.2* 31.5*  PLT 105* 82* 88* 69*    COAGS: Recent Labs    11/29/21 1114 03/15/22 1053 08/03/22 1102 09/04/22 0559  INR 1.3* 1.1 1.4* 1.2    BMP: Recent Labs    01/13/22 0803 03/15/22 1053 05/15/22 0801 05/22/22 1414 07/11/22 0954 07/17/22 1107 08/03/22 1102 09/04/22 0559  NA 124*   < > 126*   < > 126* 124* 125* 124*  K 4.8   < > 4.6   < > 5.5* 5.2 4.9 4.3  CL 94*   < > 94*   < > 96* 94* 95* 92*  CO2 23   < > 22   < > 22 22 22  19*  GLUCOSE 130*   < > 116*   < > 131* 87 85 109*  BUN 19   < > 27*   < > 22 19 23* 21*  CALCIUM 8.8*   < > 9.2   < > 9.5 9.0 8.9 8.8*  CREATININE 1.46*   < > 1.64*   < > 1.45* 1.38* 1.49* 1.42*  GFRNONAA 59*  --  52*  --   --   --  58* >60   < > = values in this interval not displayed.    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 This procedure will be done under general anesthesia with planned overnight observation.   Risks and benefits of TIPS, BRTO and/or additional variceal embolization were discussed with the patient and/or the patient's family including, but not limited to, infection, bleeding, damage to adjacent structures, worsening hepatic and/or cardiac function, worsening and/or the development of altered mental status/encephalopathy, non-target embolization and death.   This interventional procedure involves the use of X-rays and because of the nature of the planned procedure, it is possible that we will have prolonged use of X-ray fluoroscopy.  Potential radiation risks to you include (but are not limited to) the following: - A slightly elevated risk for cancer  several years later in life. This risk is typically less than 0.5% percent. This risk is low in comparison to the normal incidence of  human cancer, which is 33% for women and 50% for men according to the [employer]. - Radiation induced injury can include skin redness, resembling a rash, tissue breakdown / ulcers and hair loss (which can be temporary or permanent).   The likelihood of either of these occurring depends on the difficulty of the procedure and whether you are sensitive to radiation due to previous procedures, disease, or genetic conditions.   IF your procedure requires a prolonged use of radiation, you will be notified and given written instructions for further action.  It is your responsibility to monitor the irradiated area for the 2 weeks following the procedure and to notify your physician if you are concerned that you have suffered a radiation induced injury.    All of the patient's questions were answered, patient is agreeable to proceed. He has been NPO. His last dose of Eliquis was Friday 09/01/22. The patient is a full code.    Consent signed and in chart.  Thank you for this interesting consult.  I greatly enjoyed meeting Kiev Sonier and look forward to participating in their care.  A copy of this report was sent to the requesting provider on this date.  Electronically Signed: Soyla Dryer, AGACNP-BC [phone] 09/04/2022, 7:04 AM   I spent a total of  30 Minutes   in face to face in clinical consultation, greater than 50% of which was counseling/coordinating care for TIPS

## 2022-08-29 NOTE — H&P (Deleted)
  The note originally documented on this encounter has been moved the the encounter in which it belongs.  

## 2022-08-30 ENCOUNTER — Encounter (HOSPITAL_COMMUNITY): Payer: Self-pay | Admitting: Interventional Radiology

## 2022-08-30 ENCOUNTER — Ambulatory Visit (HOSPITAL_COMMUNITY)
Admission: RE | Admit: 2022-08-30 | Discharge: 2022-08-30 | Disposition: A | Discharge status: Home / Self Care | Payer: Medicaid Other | Source: Ambulatory Visit | Attending: Gastroenterology | Admitting: Gastroenterology

## 2022-08-30 ENCOUNTER — Encounter (HOSPITAL_COMMUNITY): Payer: Self-pay

## 2022-08-30 DIAGNOSIS — R188 Other ascites: Secondary | ICD-10-CM | POA: Diagnosis not present

## 2022-08-30 LAB — BODY FLUID CELL COUNT WITH DIFFERENTIAL
Eos, Fluid: 0 %
Lymphs, Fluid: 34 %
Monocyte-Macrophage-Serous Fluid: 61 % (ref 50–90)
Neutrophil Count, Fluid: 5 % (ref 0–25)
Total Nucleated Cell Count, Fluid: 320 cu mm (ref 0–1000)

## 2022-08-30 MED ORDER — ALBUMIN HUMAN 25 % IV SOLN: 50.0000 g | Freq: Once | INTRAVENOUS | Status: AC

## 2022-08-30 MED ORDER — ALBUMIN HUMAN 25 % IV SOLN
INTRAVENOUS | Status: AC
  Administered 2022-08-30: 50 g via INTRAVENOUS
  Filled 2022-08-30: qty 200

## 2022-08-30 NOTE — Procedures (Signed)
   Cirrhosis; recurrent ascites Scheduled for TIPS 09/04/22 at [facility]  US guided RLQ paracentesis 5 liters (max per MD)-- blood tinged fluid Sent for labs  Tolerated well  EBL: scant

## 2022-08-30 NOTE — Progress Notes (Addendum)
   Pt is scheduled for TIPs procedure with Dr Serafina Royals Monday September 04, 2022.  He was in for paracentesis today--- see notes  We discussed his upcoming procedure and he had some questions regarding Eliquis  Informed him to take last dose Friday pm Do not take Saturday or Sunday (off x 2 days prior to procedure.).  We will determine plan for restart before DC next day  He is aware and agreeable Understands plan

## 2022-08-30 NOTE — Progress Notes (Signed)
PCP - Sofie Rower, PA-C Cardiologist - Dr Rozann Lesches GI - Dr Gala Romney Nephrology - Dr Ulice Bold Oncology - Tarri Abernethy, PA-C  Chest x-ray - n/a EKG - 09/05/21 Stress Test - n/a ECHO - 12/31/20 Cardiac Cath - n/a  ICD Pacemaker/Loop - n/a  Sleep Study -  Yes, 12/23/20 CPAP - uses CPAP, Avg pressure 7 cm but max 8 cm  Diabetes - n/a  Blood Thinner Instructions:  Last dose of Eliquis will be on Friday, 09/01/22 per MD.  Aspirin Instructions: n/a.  NPO  Anesthesia review: Yes  STOP now taking any Aspirin (unless otherwise instructed by your surgeon), Aleve, Naproxen, Ibuprofen, Motrin, Advil, Goody's, BC's, all herbal medications, fish oil, and all vitamins.   Coronavirus Screening Do you have any of the following symptoms:  Cough yes/no: No Fever (>100.35F)  yes/no: No Runny nose yes/no: No Sore throat yes/no: No Difficulty breathing/shortness of breath  yes/no: No  Have you traveled in the last 14 days and where? yes/no: No  Patient verbalized understanding of instructions that were given via phone.

## 2022-08-31 ENCOUNTER — Other Ambulatory Visit: Payer: Self-pay | Admitting: Radiology

## 2022-08-31 ENCOUNTER — Encounter (HOSPITAL_COMMUNITY): Payer: Self-pay | Admitting: Interventional Radiology

## 2022-08-31 DIAGNOSIS — K766 Portal hypertension: Secondary | ICD-10-CM

## 2022-09-01 ENCOUNTER — Other Ambulatory Visit: Payer: Self-pay | Admitting: Student

## 2022-09-04 ENCOUNTER — Ambulatory Visit (HOSPITAL_COMMUNITY): Payer: Medicaid Other | Admitting: Physician Assistant

## 2022-09-04 ENCOUNTER — Other Ambulatory Visit: Payer: Self-pay

## 2022-09-04 ENCOUNTER — Encounter (HOSPITAL_COMMUNITY): Payer: Self-pay | Admitting: Interventional Radiology

## 2022-09-04 ENCOUNTER — Observation Stay (HOSPITAL_COMMUNITY)
Admission: AD | Admit: 2022-09-04 | Discharge: 2022-09-05 | Disposition: A | Discharge status: Home / Self Care | Payer: Medicaid Other | Attending: Interventional Radiology | Admitting: Interventional Radiology

## 2022-09-04 ENCOUNTER — Encounter (HOSPITAL_COMMUNITY)
Admission: AD | Disposition: A | Discharge status: Home / Self Care | Payer: Self-pay | Source: Home / Self Care | Attending: Interventional Radiology

## 2022-09-04 ENCOUNTER — Inpatient Hospital Stay (HOSPITAL_COMMUNITY)
Admission: RE | Admit: 2022-09-04 | Discharge: 2022-09-04 | Disposition: A | Discharge status: Home / Self Care | Payer: Medicaid Other | Source: Ambulatory Visit | Attending: Interventional Radiology | Admitting: Interventional Radiology

## 2022-09-04 DIAGNOSIS — K7460 Unspecified cirrhosis of liver: Secondary | ICD-10-CM | POA: Diagnosis not present

## 2022-09-04 DIAGNOSIS — K766 Portal hypertension: Principal | ICD-10-CM | POA: Insufficient documentation

## 2022-09-04 DIAGNOSIS — I868 Varicose veins of other specified sites: Secondary | ICD-10-CM | POA: Diagnosis not present

## 2022-09-04 DIAGNOSIS — K7581 Nonalcoholic steatohepatitis (NASH): Secondary | ICD-10-CM | POA: Diagnosis not present

## 2022-09-04 DIAGNOSIS — N1831 Chronic kidney disease, stage 3a: Secondary | ICD-10-CM | POA: Diagnosis not present

## 2022-09-04 DIAGNOSIS — K7031 Alcoholic cirrhosis of liver with ascites: Principal | ICD-10-CM

## 2022-09-04 DIAGNOSIS — G4733 Obstructive sleep apnea (adult) (pediatric): Secondary | ICD-10-CM | POA: Insufficient documentation

## 2022-09-04 DIAGNOSIS — N1832 Chronic kidney disease, stage 3b: Secondary | ICD-10-CM

## 2022-09-04 DIAGNOSIS — Z95828 Presence of other vascular implants and grafts: Secondary | ICD-10-CM

## 2022-09-04 DIAGNOSIS — I81 Portal vein thrombosis: Secondary | ICD-10-CM | POA: Diagnosis not present

## 2022-09-04 DIAGNOSIS — I129 Hypertensive chronic kidney disease with stage 1 through stage 4 chronic kidney disease, or unspecified chronic kidney disease: Secondary | ICD-10-CM | POA: Insufficient documentation

## 2022-09-04 DIAGNOSIS — R188 Other ascites: Secondary | ICD-10-CM | POA: Diagnosis not present

## 2022-09-04 DIAGNOSIS — D631 Anemia in chronic kidney disease: Secondary | ICD-10-CM

## 2022-09-04 HISTORY — PX: RADIOLOGY WITH ANESTHESIA: SHX6223

## 2022-09-04 HISTORY — PX: IR ANGIOGRAM SELECTIVE EACH ADDITIONAL VESSEL: IMG667

## 2022-09-04 HISTORY — PX: IR TIPS: IMG2295

## 2022-09-04 HISTORY — PX: IR US GUIDE VASC ACCESS RIGHT: IMG2390

## 2022-09-04 HISTORY — DX: Headache, unspecified: R51.9

## 2022-09-04 HISTORY — PX: IR PARACENTESIS: IMG2679

## 2022-09-04 HISTORY — PX: IR EMBO VENOUS NOT HEMORR HEMANG  INC GUIDE ROADMAPPING: IMG5447

## 2022-09-04 HISTORY — PX: IR INTRAVASCULAR ULTRASOUND NON CORONARY: IMG6085

## 2022-09-04 LAB — BASIC METABOLIC PANEL
Anion gap: 13 (ref 5–15)
BUN: 21 mg/dL — ABNORMAL HIGH (ref 6–20)
CO2: 19 mmol/L — ABNORMAL LOW (ref 22–32)
Calcium: 8.8 mg/dL — ABNORMAL LOW (ref 8.9–10.3)
Chloride: 92 mmol/L — ABNORMAL LOW (ref 98–111)
Creatinine, Ser: 1.42 mg/dL — ABNORMAL HIGH (ref 0.61–1.24)
GFR, Estimated: >60 mL/min (ref >60)
Glucose, Bld: 109 mg/dL — ABNORMAL HIGH (ref 70–99)
Potassium: 4.3 mmol/L (ref 3.5–5.1)
Sodium: 124 mmol/L — ABNORMAL LOW (ref 135–145)

## 2022-09-04 LAB — PROTIME-INR
INR: 1.2 (ref 0.8–1.2)
Prothrombin Time: 14.7 seconds (ref 11.4–15.2)

## 2022-09-04 LAB — CBC
HCT: 31.5 % — ABNORMAL LOW (ref 39.0–52.0)
Hemoglobin: 10.8 g/dL — ABNORMAL LOW (ref 13.0–17.0)
MCH: 30.6 pg (ref 26.0–34.0)
MCHC: 34.3 g/dL (ref 30.0–36.0)
MCV: 89.2 fL (ref 80.0–100.0)
Platelets: 69 10*3/uL — ABNORMAL LOW (ref 150–400)
RBC: 3.53 MIL/uL — ABNORMAL LOW (ref 4.22–5.81)
RDW: 15.1 % (ref 11.5–15.5)
WBC: 4.2 10*3/uL (ref 4.0–10.5)
nRBC: 0 % (ref 0.0–0.2)

## 2022-09-04 LAB — ABO/RH: ABO/RH(D): O POS

## 2022-09-04 LAB — PREPARE RBC (CROSSMATCH)

## 2022-09-04 SURGERY — IR WITH ANESTHESIA
Anesthesia: General

## 2022-09-04 MED ORDER — CIPROFLOXACIN HCL 500 MG PO TABS
500.0000 mg | ORAL_TABLET | Freq: Every day | ORAL | Status: DC
  Administered 2022-09-05: 500 mg via ORAL
  Filled 2022-09-04: qty 1

## 2022-09-04 MED ORDER — ORAL CARE MOUTH RINSE: 15.0000 mL | Freq: Once | OROMUCOSAL | Status: AC

## 2022-09-04 MED ORDER — ROCURONIUM BROMIDE 10 MG/ML (PF) SYRINGE
PREFILLED_SYRINGE | INTRAVENOUS | Status: DC | PRN
  Administered 2022-09-04: 40 mg via INTRAVENOUS
  Administered 2022-09-04: 60 mg via INTRAVENOUS

## 2022-09-04 MED ORDER — DEXAMETHASONE SODIUM PHOSPHATE 10 MG/ML IJ SOLN
INTRAMUSCULAR | Status: DC | PRN
  Administered 2022-09-04: 5 mg via INTRAVENOUS

## 2022-09-04 MED ORDER — FENTANYL CITRATE (PF) 100 MCG/2ML IJ SOLN
INTRAMUSCULAR | Status: DC | PRN
  Administered 2022-09-04 (×2): 50 ug via INTRAVENOUS

## 2022-09-04 MED ORDER — LIDOCAINE-EPINEPHRINE 1 %-1:100000 IJ SOLN
INTRAMUSCULAR | Status: AC
  Filled 2022-09-04: qty 1

## 2022-09-04 MED ORDER — LACTULOSE 10 GM/15ML PO SOLN
30.0000 g | Freq: Three times a day (TID) | ORAL | Status: DC
  Administered 2022-09-04 – 2022-09-05 (×3): 30 g via ORAL
  Filled 2022-09-04 (×3): qty 45

## 2022-09-04 MED ORDER — OXYCODONE HCL 5 MG PO TABS: 5.0000 mg | ORAL_TABLET | ORAL | Status: DC | PRN

## 2022-09-04 MED ORDER — SODIUM CHLORIDE 0.9 % IV SOLN: INTRAVENOUS | Status: DC

## 2022-09-04 MED ORDER — DULOXETINE HCL 60 MG PO CPEP
120.0000 mg | ORAL_CAPSULE | Freq: Every day | ORAL | Status: DC
  Administered 2022-09-05: 120 mg via ORAL
  Filled 2022-09-04: qty 2

## 2022-09-04 MED ORDER — MIDAZOLAM HCL 2 MG/2ML IJ SOLN
INTRAMUSCULAR | Status: AC
  Filled 2022-09-04: qty 2

## 2022-09-04 MED ORDER — IOHEXOL 300 MG/ML  SOLN
100.0000 mL | Freq: Once | INTRAMUSCULAR | Status: AC | PRN
  Administered 2022-09-04: 25 mL via INTRAVENOUS

## 2022-09-04 MED ORDER — ONDANSETRON HCL 4 MG/2ML IJ SOLN
INTRAMUSCULAR | Status: DC | PRN
  Administered 2022-09-04: 4 mg via INTRAVENOUS

## 2022-09-04 MED ORDER — SODIUM CHLORIDE 0.9 % IV SOLN
2.0000 g | INTRAVENOUS | Status: AC
  Administered 2022-09-04: 2 g via INTRAVENOUS
  Filled 2022-09-04: qty 20

## 2022-09-04 MED ORDER — HYDROMORPHONE HCL 1 MG/ML IJ SOLN: 0.2500 mg | INTRAMUSCULAR | Status: DC | PRN

## 2022-09-04 MED ORDER — ALBUMIN HUMAN 5 % IV SOLN: INTRAVENOUS | Status: DC | PRN

## 2022-09-04 MED ORDER — HYDRALAZINE HCL 20 MG/ML IJ SOLN: 10.0000 mg | Freq: Four times a day (QID) | INTRAMUSCULAR | Status: DC | PRN

## 2022-09-04 MED ORDER — VITAMIN D 25 MCG (1000 UNIT) PO TABS
5000.0000 [IU] | ORAL_TABLET | Freq: Every morning | ORAL | Status: DC
  Administered 2022-09-05: 5000 [IU] via ORAL
  Filled 2022-09-04: qty 5

## 2022-09-04 MED ORDER — RIFAXIMIN 550 MG PO TABS
550.0000 mg | ORAL_TABLET | Freq: Two times a day (BID) | ORAL | Status: DC
  Administered 2022-09-04 – 2022-09-05 (×3): 550 mg via ORAL
  Filled 2022-09-04 (×3): qty 1

## 2022-09-04 MED ORDER — LEVOCARNITINE 1 GM/10ML PO SOLN
330.0000 mg | Freq: Three times a day (TID) | ORAL | Status: DC
  Administered 2022-09-04 – 2022-09-05 (×3): 330 mg via ORAL
  Filled 2022-09-04 (×5): qty 3.3

## 2022-09-04 MED ORDER — OXYCODONE HCL 5 MG PO TABS: 10.0000 mg | ORAL_TABLET | ORAL | Status: DC | PRN

## 2022-09-04 MED ORDER — LACTATED RINGERS IV SOLN: INTRAVENOUS | Status: DC

## 2022-09-04 MED ORDER — ADULT MULTIVITAMIN W/MINERALS CH
1.0000 | ORAL_TABLET | Freq: Every morning | ORAL | Status: DC
  Administered 2022-09-05: 1 via ORAL
  Filled 2022-09-04: qty 1

## 2022-09-04 MED ORDER — PROPOFOL 10 MG/ML IV BOLUS
INTRAVENOUS | Status: DC | PRN
  Administered 2022-09-04: 200 mg via INTRAVENOUS

## 2022-09-04 MED ORDER — PANTOPRAZOLE SODIUM 40 MG PO TBEC
40.0000 mg | DELAYED_RELEASE_TABLET | Freq: Every day | ORAL | Status: DC
  Administered 2022-09-04 – 2022-09-05 (×2): 40 mg via ORAL
  Filled 2022-09-04 (×2): qty 1

## 2022-09-04 MED ORDER — GLYCOPYRROLATE PF 0.2 MG/ML IJ SOSY
PREFILLED_SYRINGE | INTRAMUSCULAR | Status: DC | PRN
  Administered 2022-09-04: .2 mg via INTRAVENOUS

## 2022-09-04 MED ORDER — CHLORHEXIDINE GLUCONATE 0.12 % MT SOLN
15.0000 mL | Freq: Once | OROMUCOSAL | Status: AC
  Administered 2022-09-04: 15 mL via OROMUCOSAL

## 2022-09-04 MED ORDER — MEPERIDINE HCL 25 MG/ML IJ SOLN: 6.2500 mg | INTRAMUSCULAR | Status: DC | PRN

## 2022-09-04 MED ORDER — ONDANSETRON HCL 4 MG PO TABS
4.0000 mg | ORAL_TABLET | Freq: Every day | ORAL | Status: DC
  Administered 2022-09-04 – 2022-09-05 (×2): 4 mg via ORAL
  Filled 2022-09-04 (×2): qty 1

## 2022-09-04 MED ORDER — GABAPENTIN 300 MG PO CAPS: 300.0000 mg | ORAL_CAPSULE | Freq: Three times a day (TID) | ORAL | Status: DC | PRN

## 2022-09-04 MED ORDER — FERROUS SULFATE 325 (65 FE) MG PO TABS
325.0000 mg | ORAL_TABLET | Freq: Every day | ORAL | Status: DC
  Administered 2022-09-05: 325 mg via ORAL
  Filled 2022-09-04: qty 1

## 2022-09-04 MED ORDER — NEOSTIGMINE METHYLSULFATE 10 MG/10ML IV SOLN
INTRAVENOUS | Status: DC | PRN
  Administered 2022-09-04: 2 mg via INTRAVENOUS

## 2022-09-04 MED ORDER — IOHEXOL 300 MG/ML  SOLN
150.0000 mL | Freq: Once | INTRAMUSCULAR | Status: AC | PRN
  Administered 2022-09-04: 75 mL via INTRAVENOUS

## 2022-09-04 MED ORDER — FUROSEMIDE 40 MG PO TABS
40.0000 mg | ORAL_TABLET | Freq: Two times a day (BID) | ORAL | Status: DC
  Administered 2022-09-04 – 2022-09-05 (×2): 40 mg via ORAL
  Filled 2022-09-04 (×2): qty 1

## 2022-09-04 MED ORDER — SPIRONOLACTONE 100 MG PO TABS
100.0000 mg | ORAL_TABLET | Freq: Every day | ORAL | Status: DC
  Administered 2022-09-04 – 2022-09-05 (×2): 100 mg via ORAL
  Filled 2022-09-04 (×2): qty 1

## 2022-09-04 MED ORDER — MIDAZOLAM HCL 2 MG/2ML IJ SOLN
INTRAMUSCULAR | Status: DC | PRN
  Administered 2022-09-04: 2 mg via INTRAVENOUS

## 2022-09-04 MED ORDER — SODIUM CHLORIDE 0.9 % IV SOLN: INTRAVENOUS | Status: DC | PRN

## 2022-09-04 MED ORDER — CYCLOBENZAPRINE HCL 10 MG PO TABS: 10.0000 mg | ORAL_TABLET | Freq: Three times a day (TID) | ORAL | Status: DC | PRN

## 2022-09-04 MED ORDER — FENTANYL CITRATE (PF) 100 MCG/2ML IJ SOLN
INTRAMUSCULAR | Status: AC
  Filled 2022-09-04: qty 2

## 2022-09-04 MED ORDER — ZINC SULFATE 220 (50 ZN) MG PO CAPS
220.0000 mg | ORAL_CAPSULE | Freq: Every morning | ORAL | Status: DC
  Administered 2022-09-05: 220 mg via ORAL
  Filled 2022-09-04: qty 1

## 2022-09-04 MED ORDER — CHLORHEXIDINE GLUCONATE 0.12 % MT SOLN
OROMUCOSAL | Status: AC
  Filled 2022-09-04: qty 15

## 2022-09-04 NOTE — Plan of Care (Signed)
  Problem: Education: Goal: Knowledge of General Education information will improve Description: Including pain rating scale, medication(s)/side effects and non-pharmacologic comfort measures Outcome: Progressing   Problem: Health Behavior/Discharge Planning: Goal: Ability to manage health-related needs will improve Outcome: Progressing   Problem: Clinical Measurements: Goal: Ability to maintain clinical measurements within normal limits will improve Outcome: Progressing   Problem: Activity: Goal: Risk for activity intolerance will decrease Outcome: Progressing   Problem: Elimination: Goal: Will not experience complications related to bowel motility Outcome: Progressing Goal: Will not experience complications related to urinary retention Outcome: Progressing   Problem: Pain Managment: Goal: General experience of comfort will improve Outcome: Progressing   Problem: Safety: Goal: Ability to remain free from injury will improve Outcome: Progressing   Problem: Skin Integrity: Goal: Risk for impaired skin integrity will decrease Outcome: Progressing   

## 2022-09-04 NOTE — Procedures (Signed)
Interventional Radiology Procedure Note  Procedure:  1) Ultrasound guided paracentesis 2) TIPS creation 3) Paraumbilical variceal embolization  Findings: Please refer to procedural dictation for full description.  Right hepatic vein to right portal vein TIPS, post deployment balloon expansion to 10 mm.  Mean portosystemic gradient from 24 --> 11 mmHg.  Coil embolization of recanalized paraumbilical varix.   Paracentesis yielded 7.8 L ascites.  Complications: None immediate  Estimated Blood Loss: < 5 mL  Recommendations: Admit to IR overnight. Resume lactulose. CBC, CMP, INR in am.   Ruthann Cancer, MD Pager: [phone] Clinic: [phone]

## 2022-09-04 NOTE — Progress Notes (Signed)
Patient s/p TIPS creation, coil embolization of recanalized paraumbilical vein and paracentesis today with Dr. Serafina Royals. Procedure was done under general anesthesia.   Patient evaluated post-procedure on nursing unit (6N). He is awake, alert and has had food/drink. He denies nausea or vomiting. Foley catheter has been removed but he has not urinated. He endorses 2/10 generalized abdominal pain. Abdomen is otherwise soft with minimal tenderness to palpation. Procedure sites - Right IJ, right lateral abdomen and right CFV sites are all clean, dry and intact. Mild tenderness to palpation only at the Right IJ site.   Patient aware of tentative discharge plans 09/05/22. Patient knows he will need to ambulate, urinate, have a bowel movement and have labs that are within acceptable parameters for consideration of discharge home.   IR will see the patient in the morning. Please call IR on call MD with any questions/concerns.  Soyla Dryer, [place] [phone] 09/04/2022, 3:54 PM

## 2022-09-04 NOTE — Sedation Documentation (Signed)
Refer to anesthesia documentation for remaineder of procedure

## 2022-09-04 NOTE — Progress Notes (Signed)
Clear liquid tray ordered 

## 2022-09-04 NOTE — Progress Notes (Signed)
Patient arrived to [place] room 25 alert and oriented x4. Pain level 0/10. 2L/[place]. Bed in lowest position. Call light in reach. Will continue to monitor pt.

## 2022-09-04 NOTE — Progress Notes (Unsigned)
Referring Provider: Sofie Rower, PA-C Primary Care Physician:  Sofie Rower, PA-C Primary GI Physician: Dr. Rayne Du chief complaint on file.   HPI:   Alejandro Porter is a 47 y.o. male presenting today with a history of     TIPS procedure 09/04/22.   Past Medical History:  Diagnosis Date   Anxiety 08/2022   CKD (chronic kidney disease) stage 2, GFR 60-89 ml/min 05/24/2022   stage 3a   Depression 06/2022   GERD (gastroesophageal reflux disease)    H/O colonoscopy    H/O endoscopy    Headache    Hypertension    Liver cirrhosis    Neuropathy    OSA (obstructive sleep apnea)    not currently using CPAP   S/P abdominal paracentesis     Past Surgical History:  Procedure Laterality Date   BIOPSY  09/12/2019   Procedure: BIOPSY;  Surgeon: Daneil Dolin, MD;  [facility]: [facility];  Service: Endoscopy;;   COLONOSCOPY N/A 09/18/2019   Dr. Oneida Alar: 12 colon polyps removed, multiple simple adenomas and some inflammatory polyps, diverticulosis, external and internal hemorrhoids.  Next colonoscopy in 3 years.   COLONOSCOPY WITH PROPOFOL N/A 03/24/2022   Procedure: COLONOSCOPY WITH PROPOFOL;  Surgeon: Eloise Harman, DO;  Location: [facility];  Service: Endoscopy;  Laterality: N/A;  1:30pm, asa 3   ESOPHAGEAL BANDING N/A 09/12/2019   Procedure: ESOPHAGEAL BANDING;  Surgeon: Daneil Dolin, MD;  Location: [facility];  Service: Endoscopy;  Laterality: N/A;   ESOPHAGOGASTRODUODENOSCOPY N/A 09/12/2019   Dr. Gala Romney: Mild erosive reflux esophagitis.  Schatzki ring status post dilation.  Small grade 1/grade 2 esophageal varices, portal hypertensive gastropathy, hyperplastic gastric polyp, gastric erosions with chronic inactive gastritis on biopsies, no H. pylori.   ESOPHAGOGASTRODUODENOSCOPY (EGD) WITH PROPOFOL N/A 12/16/2020   two short columns of no more than Grade 2 esophageal varices, appearing innocent. Grade 1 varices not apparent today. Multiple large posterior  body and antral hyperplastic, hemorrhagic appearing polyps, larges approximately 2.5 cm pedunculated. Portal gastropathy. Normal duodenum. Polypectomy with clips placed. EGD in 18 months.   ESOPHAGOGASTRODUODENOSCOPY (EGD) WITH PROPOFOL N/A 03/24/2022   Procedure: ESOPHAGOGASTRODUODENOSCOPY (EGD) WITH PROPOFOL;  Surgeon: Eloise Harman, DO;  [facility];  Service: Endoscopy;  Laterality: N/A;   IR ANGIOGRAM SELECTIVE EACH ADDITIONAL VESSEL  09/04/2022   IR EMBO VENOUS NOT HEMORR HEMANG  INC GUIDE ROADMAPPING  09/04/2022   IR INTRAVASCULAR ULTRASOUND NON CORONARY  09/04/2022   IR PARACENTESIS  09/04/2022   IR RADIOLOGIST EVAL & MGMT  07/19/2022   IR RADIOLOGIST EVAL & MGMT  08/14/2022   IR TIPS  09/04/2022   IR US GUIDE VASC ACCESS RIGHT  09/04/2022   POLYPECTOMY  09/18/2019   Procedure: POLYPECTOMY;  Surgeon: Danie Binder, MD;  [facility]: [facility];  Service: Endoscopy;;   POLYPECTOMY  12/16/2020   Procedure: POLYPECTOMY;  Surgeon: Daneil Dolin, MD;  [facility];  Service: Endoscopy;;  gastric   POLYPECTOMY  03/24/2022   Procedure: POLYPECTOMY;  Surgeon: Eloise Harman, DO;  [facility]: [facility];  Service: Endoscopy;;    No current facility-administered medications for this visit.   No current outpatient medications on file.   Facility-Administered Medications Ordered in Other Visits  Medication Dose Route Frequency Provider Last Rate Last Admin   chlorhexidine (PERIDEX) 0.12 % solution            [START ON 09/05/2022] cholecalciferol (VITAMIN D3) 25 MCG (1000  UNIT) tablet 5,000 Units  5,000 Units Oral q AM Suttle, Rosanne Ashing, MD       [START ON 09/05/2022] ciprofloxacin (CIPRO) tablet 500 mg  500 mg Oral Daily Suttle, Rosanne Ashing, MD       cyclobenzaprine (FLEXERIL) tablet 10 mg  10 mg Oral TID PRN Suzette Battiest, MD       [START ON 09/05/2022] DULoxetine (CYMBALTA) DR capsule 120 mg  120 mg Oral Daily Suttle, Rosanne Ashing, MD       Derrill Memo ON 09/05/2022] ferrous sulfate tablet  325 mg  325 mg Oral Q breakfast Suttle, Rosanne Ashing, MD       furosemide (LASIX) tablet 40 mg  40 mg Oral BID Suttle, Rosanne Ashing, MD       gabapentin (NEURONTIN) capsule 300 mg  300 mg Oral TID PRN Suttle, Rosanne Ashing, MD       hydrALAZINE (APRESOLINE) injection 10 mg  10 mg Intravenous Q6H PRN Suttle, Rosanne Ashing, MD       lactulose (CHRONULAC) 10 GM/15ML solution 30 g  30 g Oral TID Suttle, Rosanne Ashing, MD       levOCARNitine (CARNITOR) 1 GM/10ML solution 330 mg  330 mg Oral TID Suttle, Rosanne Ashing, MD       lidocaine-EPINEPHrine (XYLOCAINE W/EPI) 1 %-1:100000 (with pres) injection            [START ON 09/05/2022] multivitamin with minerals tablet 1 tablet  1 tablet Oral q AM Suttle, Rosanne Ashing, MD       ondansetron (ZOFRAN) tablet 4 mg  4 mg Oral Daily Suttle, Dylan J, MD       oxyCODONE (Oxy IR/ROXICODONE) immediate release tablet 5 mg  5 mg Oral Q4H PRN Suttle, Rosanne Ashing, MD       Or   oxyCODONE (Oxy IR/ROXICODONE) immediate release tablet 10 mg  10 mg Oral Q4H PRN Suttle, Dylan J, MD       pantoprazole (PROTONIX) EC tablet 40 mg  40 mg Oral Daily Suttle, Dylan J, MD       rifaximin (XIFAXAN) tablet 550 mg  550 mg Oral BID Suttle, Dylan J, MD       spironolactone (ALDACTONE) tablet 100 mg  100 mg Oral Daily Suttle, Dylan J, MD       [START ON 09/05/2022] zinc sulfate capsule 220 mg  220 mg Oral q AM Suttle, Rosanne Ashing, MD        Allergies as of 09/06/2022 - Review Complete 09/04/2022  Allergen Reaction Noted   Chlorthalidone  11/04/2019   Metoprolol  11/04/2019    Family History  Problem Relation Age of Onset   Brain cancer Mother    Diabetes Mother    Diabetes Father    Pulmonary embolism Brother    Liver disease Neg Hx     Social History   Socioeconomic History   Marital status: Single    Spouse name: Not on file   Number of children: Not on file   Years of education: Not on file   Highest education level: Not on file  Occupational History   Occupation: umemployed  Tobacco Use   Smoking status: Never     Passive exposure: Current   Smokeless tobacco: Never  Vaping Use   Vaping Use: Never used  Substance and Sexual Activity   Alcohol use: Not Currently   Drug use: Never   Sexual activity: Not Currently  Other Topics Concern   Not on file  Social History Narrative  Not on file   Social Determinants of Health   Financial Resource Strain: Not on file  Food Insecurity: Not on file  Transportation Needs: Not on file  Physical Activity: Not on file  Stress: Not on file  Social Connections: Not on file    Review of Systems: Gen: Denies fever, chills, anorexia. Denies fatigue, weakness, weight loss.  CV: Denies chest pain, palpitations, syncope, peripheral edema, and claudication. Resp: Denies dyspnea at rest, cough, wheezing, coughing up blood, and pleurisy. GI: Denies vomiting blood, jaundice, and fecal incontinence.   Denies dysphagia or odynophagia. Derm: Denies rash, itching, dry skin Psych: Denies depression, anxiety, memory loss, confusion. No homicidal or suicidal ideation.  Heme: Denies bruising, bleeding, and enlarged lymph nodes.  Physical Exam: There were no vitals taken for this visit. General:   Alert and oriented. No distress noted. Pleasant and cooperative.  Head:  Normocephalic and atraumatic. Eyes:  Conjuctiva clear without scleral icterus. Heart:  S1, S2 present without murmurs appreciated. Lungs:  Clear to auscultation bilaterally. No wheezes, rales, or rhonchi. No distress.  Abdomen:  +BS, soft, non-tender and non-distended. No rebound or guarding. No HSM or masses noted. Msk:  Symmetrical without gross deformities. Normal posture. Extremities:  Without edema. Neurologic:  Alert and  oriented x4 Psych:  Normal mood and affect.    Assessment:     Plan:  ***   Aliene Altes, PA-C [facility] 09/06/2022

## 2022-09-04 NOTE — Progress Notes (Signed)
Called and spoke to Dr. Malachi Carl PA Roselyn Reef to notify of patient's abnormal CBC and BMP results from lab draw this morning- including Platelet count 69 and Sodium level 124.   Roselyn Reef states she will notify Dr. Serafina Royals.

## 2022-09-04 NOTE — Transfer of Care (Signed)
Immediate Anesthesia Transfer of Care Note  Patient: Alejandro Porter  Procedure(s) Performed: TIPS  Patient Location: PACU  Anesthesia Type:General  Level of Consciousness: drowsy and patient cooperative  Airway & Oxygen Therapy: Patient Spontanous Breathing and Patient connected to nasal cannula oxygen  Post-op Assessment: Report given to RN, Post -op Vital signs reviewed and stable, and Patient moving all extremities X 4  Post vital signs: Reviewed and stable  Last Vitals:  Vitals Value Taken Time  BP 100/63 09/04/22 1115  Temp    Pulse 87 09/04/22 1116  Resp 22 09/04/22 1116  SpO2 93 % 09/04/22 1116  Vitals shown include unvalidated device data.  Last Pain:  Vitals:   09/04/22 0606  TempSrc:   PainSc: 2       Patients Stated Pain Goal: 1 ([id number])  Complications: No notable events documented.

## 2022-09-04 NOTE — Anesthesia Preprocedure Evaluation (Addendum)
Anesthesia Evaluation  Patient identified by MRN, date of birth, ID band Patient awake    Reviewed: Allergy & Precautions, H&P , NPO status , Patient's Chart, lab work & pertinent test results  Airway Mallampati: II       Dental  (+) Poor Dentition, Chipped   Pulmonary sleep apnea    Pulmonary exam normal        Cardiovascular Exercise Tolerance: Good hypertension, Normal cardiovascular exam     Neuro/Psych  PSYCHIATRIC DISORDERS Anxiety Depression       GI/Hepatic Neg liver ROS,GERD  Medicated and Controlled,,  Endo/Other    Morbid obesity  Renal/GU CRF and Renal InsufficiencyRenal diseaseCKD IIIb  negative genitourinary   Musculoskeletal negative musculoskeletal ROS (+)    Abdominal  (+) + obese  Peds  Hematology  (+) Blood dyscrasia, anemia   Anesthesia Other Findings   Reproductive/Obstetrics negative OB ROS                             Anesthesia Physical Anesthesia Plan  ASA: 3  Anesthesia Plan: General   Post-op Pain Management:    Induction:   PONV Risk Score and Plan: 2 and Ondansetron and Midazolam  Airway Management Planned: Oral ETT  Additional Equipment: Arterial line  Intra-op Plan:   Post-operative Plan: Extubation in OR  Informed Consent: I have reviewed the patients History and Physical, chart, labs and discussed the procedure including the risks, benefits and alternatives for the proposed anesthesia with the patient or authorized representative who has indicated his/her understanding and acceptance.     Dental Advisory Given  Plan Discussed with: CRNA  Anesthesia Plan Comments: (2 PIV + A-line)        Anesthesia Quick Evaluation

## 2022-09-04 NOTE — Anesthesia Procedure Notes (Signed)
Arterial Line Insertion Start/End4/06/2022 7:27 AM Performed by: Darletta Moll, CRNA, CRNA  Patient location: Pre-op. Preanesthetic checklist: patient identified, IV checked, site marked, risks and benefits discussed, surgical consent, monitors and equipment checked, pre-op evaluation, timeout performed and anesthesia consent Lidocaine 1% used for infiltration Left, radial was placed Catheter size: 20 G Hand hygiene performed  and maximum sterile barriers used   Attempts: 1 Procedure performed without using ultrasound guided technique. Following insertion, dressing applied and Biopatch. Post procedure assessment: normal and unchanged  Patient tolerated the procedure well with no immediate complications.

## 2022-09-04 NOTE — Anesthesia Postprocedure Evaluation (Signed)
Anesthesia Post Note  Patient: Alejandro Porter  Procedure(s) Performed: TIPS     Patient location during evaluation: PACU Anesthesia Type: General Level of consciousness: awake and alert Pain management: pain level controlled Vital Signs Assessment: post-procedure vital signs reviewed and stable Respiratory status: spontaneous breathing, nonlabored ventilation, respiratory function stable and patient connected to nasal cannula oxygen Cardiovascular status: blood pressure returned to baseline and stable Postop Assessment: no apparent nausea or vomiting Anesthetic complications: no  No notable events documented.  Last Vitals:  Vitals:   09/04/22 1200 09/04/22 1223  BP: 112/74 112/69  Pulse: 84 96  Resp: 18 17  Temp: 36.7 C (!) 36.3 C  SpO2: 95% 97%    Last Pain:  Vitals:   09/04/22 1223  TempSrc: Oral  PainSc:                  [place]

## 2022-09-04 NOTE — Anesthesia Procedure Notes (Signed)
Procedure Name: Intubation Date/Time: 09/04/2022 8:41 AM  Performed by: Darletta Moll, CRNAPre-anesthesia Checklist: Patient identified, Emergency Drugs available, Suction available and Patient being monitored Patient Re-evaluated:Patient Re-evaluated prior to induction Oxygen Delivery Method: Circle system utilized Preoxygenation: Pre-oxygenation with 100% oxygen Induction Type: IV induction Ventilation: Mask ventilation without difficulty and Oral airway inserted - appropriate to patient size Laryngoscope Size: Mac and 4 Grade View: Grade I Tube type: Oral Tube size: 7.5 mm Number of attempts: 1 Airway Equipment and Method: Stylet and Oral airway Placement Confirmation: ETT inserted through vocal cords under direct vision, positive ETCO2 and breath sounds checked- equal and bilateral Secured at: 23 cm Tube secured with: Tape Dental Injury: Teeth and Oropharynx as per pre-operative assessment

## 2022-09-05 DIAGNOSIS — K766 Portal hypertension: Secondary | ICD-10-CM | POA: Diagnosis not present

## 2022-09-05 LAB — COMPREHENSIVE METABOLIC PANEL
ALT: 88 U/L — ABNORMAL HIGH (ref 0–44)
AST: 133 U/L — ABNORMAL HIGH (ref 15–41)
Albumin: 3 g/dL — ABNORMAL LOW (ref 3.5–5.0)
Alkaline Phosphatase: 113 U/L (ref 38–126)
Anion gap: 11 (ref 5–15)
BUN: 17 mg/dL (ref 6–20)
CO2: 18 mmol/L — ABNORMAL LOW (ref 22–32)
Calcium: 8.9 mg/dL (ref 8.9–10.3)
Chloride: 95 mmol/L — ABNORMAL LOW (ref 98–111)
Creatinine, Ser: 1.22 mg/dL (ref 0.61–1.24)
GFR, Estimated: >60 mL/min (ref >60)
Glucose, Bld: 112 mg/dL — ABNORMAL HIGH (ref 70–99)
Potassium: 4.5 mmol/L (ref 3.5–5.1)
Sodium: 124 mmol/L — ABNORMAL LOW (ref 135–145)
Total Bilirubin: 1 mg/dL (ref 0.3–1.2)
Total Protein: 5.9 g/dL — ABNORMAL LOW (ref 6.5–8.1)

## 2022-09-05 LAB — CBC
HCT: 28.7 % — ABNORMAL LOW (ref 39.0–52.0)
Hemoglobin: 9.7 g/dL — ABNORMAL LOW (ref 13.0–17.0)
MCH: 30 pg (ref 26.0–34.0)
MCHC: 33.8 g/dL (ref 30.0–36.0)
MCV: 88.9 fL (ref 80.0–100.0)
Platelets: 67 10*3/uL — ABNORMAL LOW (ref 150–400)
RBC: 3.23 MIL/uL — ABNORMAL LOW (ref 4.22–5.81)
RDW: 15.1 % (ref 11.5–15.5)
WBC: 6.5 10*3/uL (ref 4.0–10.5)
nRBC: 0 % (ref 0.0–0.2)

## 2022-09-05 LAB — PROTIME-INR
INR: 1.3 — ABNORMAL HIGH (ref 0.8–1.2)
Prothrombin Time: 15.7 seconds — ABNORMAL HIGH (ref 11.4–15.2)

## 2022-09-05 NOTE — Plan of Care (Addendum)
Discharge instructions discussed with patient.  Patient instructed on home medications, restrictions, and follow up appointments. Belongings gathered and sent with patient.  No new medications.  Patient discharged via wheelchair by this writer to discharge lounge.

## 2022-09-05 NOTE — Discharge Summary (Signed)
Patient ID: Alejandro Porter MRN: [id number] DOB/AGE: 07/12/1975 47 y.o.  Admit date: 09/04/2022 Discharge date: 09/05/2022  Supervising Physician: Ruthann Cancer  Patient Status: [facility] - In-pt  Admission Diagnoses: Portal Hypertension; recurrent ascites; paraumbilical varices  Discharge Diagnoses:  Principal Problem:   S/P TIPS (transjugular intrahepatic portosystemic shunt)   Discharged Condition: good  Hospital Course: Pt with portal hypertension; recurrent ascites; paraumbilical varices. Transjugular intrahepatic portal system shunt placement; paracentesis and paraumbilical variceal embolization performed in IR with Dr Serafina Royals 09/04/22 Pt tolerated well; no complications Overnight stay was uneventful Pt is doing well this am; eating well; slept well. Getting to restroom in room without assistance Denies pain; denies N/V Good cognition this am-- answers questions appropriately Urinating on own; passing gas I have evaluated pt and spoken to Dr Serafina Royals regarding pt status Plan for DC this am-- to home  Consults: None  Significant Diagnostic Studies:  1) Ultrasound guided paracentesis 2) TIPS creation 3) Paraumbilical variceal embolization  Treatments: Findings:.  Right hepatic vein to right portal vein TIPS, post deployment balloon expansion to 10 mm.  Mean portosystemic gradient from 24 --> 11 mmHg.  Coil embolization of recanalized paraumbilical varix.   Paracentesis yielded 7.8 L ascites.   Complications: None immediate  Discharge Exam: Blood pressure 109/75, pulse 88, temperature 98 F (36.7 C), temperature source Oral, resp. rate 18, height 5\' 8"  (1.727 m), weight 283 lb (128.4 kg), SpO2 98 %.  A/O [place] Answers all questions appropriately Rt neck: IJ site is clean and dry; sl tender; no bleeding; no hematoma Heart: RRR Lungs:  CTA Abd:  soft; mild diffuse tenderness Rt abd site is clean and dry; NT; no bleeding Rt groin:  NT no bleeding; clean and dry; no  hematoma Rt foot: 2+ pulses Walks to bathroom without assistance Urine is clear Passing gas  Results for orders placed or performed during the hospital encounter of 09/04/22  CBC  Result Value Ref Range   WBC 4.2 4.0 - 10.5 K/uL   RBC 3.53 (L) 4.22 - 5.81 MIL/uL   Hemoglobin 10.8 (L) 13.0 - 17.0 g/dL   HCT 31.5 (L) 39.0 - 52.0 %   MCV 89.2 80.0 - 100.0 fL   MCH 30.6 26.0 - 34.0 pg   MCHC 34.3 30.0 - 36.0 g/dL   RDW 15.1 11.5 - 15.5 %   Platelets 69 (L) 150 - 400 K/uL   nRBC 0.0 0.0 - 0.2 %  Protime-INR  Result Value Ref Range   Prothrombin Time 14.7 11.4 - 15.2 seconds   INR 1.2 0.8 - 1.2  Basic metabolic panel  Result Value Ref Range   Sodium 124 (L) 135 - 145 mmol/L   Potassium 4.3 3.5 - 5.1 mmol/L   Chloride 92 (L) 98 - 111 mmol/L   CO2 19 (L) 22 - 32 mmol/L   Glucose, Bld 109 (H) 70 - 99 mg/dL   BUN 21 (H) 6 - 20 mg/dL   Creatinine, Ser 1.42 (H) 0.61 - 1.24 mg/dL   Calcium 8.8 (L) 8.9 - 10.3 mg/dL   GFR, Estimated >60 >60 mL/min   Anion gap 13 5 - 15  CBC  Result Value Ref Range   WBC 6.5 4.0 - 10.5 K/uL   RBC 3.23 (L) 4.22 - 5.81 MIL/uL   Hemoglobin 9.7 (L) 13.0 - 17.0 g/dL   HCT 28.7 (L) 39.0 - 52.0 %   MCV 88.9 80.0 - 100.0 fL   MCH 30.0 26.0 - 34.0 pg   MCHC 33.8 30.0 -  36.0 g/dL   RDW 15.1 11.5 - 15.5 %   Platelets 67 (L) 150 - 400 K/uL   nRBC 0.0 0.0 - 0.2 %  Comprehensive metabolic panel  Result Value Ref Range   Sodium 124 (L) 135 - 145 mmol/L   Potassium 4.5 3.5 - 5.1 mmol/L   Chloride 95 (L) 98 - 111 mmol/L   CO2 18 (L) 22 - 32 mmol/L   Glucose, Bld 112 (H) 70 - 99 mg/dL   BUN 17 6 - 20 mg/dL   Creatinine, Ser 1.22 0.61 - 1.24 mg/dL   Calcium 8.9 8.9 - 10.3 mg/dL   Total Protein 5.9 (L) 6.5 - 8.1 g/dL   Albumin 3.0 (L) 3.5 - 5.0 g/dL   AST 133 (H) 15 - 41 U/L   ALT 88 (H) 0 - 44 U/L   Alkaline Phosphatase 113 38 - 126 U/L   Total Bilirubin 1.0 0.3 - 1.2 mg/dL   GFR, Estimated >60 >60 mL/min   Anion gap 11 5 - 15  Protime-INR  Result  Value Ref Range   Prothrombin Time 15.7 (H) 11.4 - 15.2 seconds   INR 1.3 (H) 0.8 - 1.2  Type and screen [place]  Result Value Ref Range   ABO/RH(D) O POS    Antibody Screen NEG    Sample Expiration      09/07/2022,2359 Performed at [facility], [address] [place], [place] [address]    Unit Number [id number]    Blood Component Type RBC LR PHER2    Unit division 00    Status of Unit ALLOCATED    Transfusion Status OK TO TRANSFUSE    Crossmatch Result Compatible    Unit Number [id number]    Blood Component Type RED CELLS,LR    Unit division 00    Status of Unit ALLOCATED    Transfusion Status OK TO TRANSFUSE    Crossmatch Result Compatible   Prepare RBC (crossmatch)  Result Value Ref Range   Order Confirmation      ORDER PROCESSED BY BLOOD BANK Performed at [facility], [address] [place], [place] [address]   ABO/Rh  Result Value Ref Range   ABO/RH(D)      O POS Performed at [facility] [address], [place],  [address]   BPAM [facility]  Result Value Ref Range   ISSUE DATE / TIME S3247862    Blood Product Unit Number [id number]    PRODUCT CODE [id number]    Unit Type and Rh 5100    Blood Product Expiration Date Q6408425    ISSUE DATE / TIME S3247862    Blood Product Unit Number [id number]    PRODUCT CODE [id number]    Unit Type and Rh 5100    Blood Product Expiration Date Q6408425       Disposition: TIPS procedure in IR with Dr Serafina Royals [id number] No complication For DC to home now Resume all home meds--- EXCEPT Eliquis--- stop now; do not restart Pt has good understanding of procedure and DC instructions He will keep all GI appointments He will hear from IR in a few days and follow up appt to be set for 3-4 weeks   Discharge Instructions     Call MD for:  difficulty breathing, headache or visual disturbances   Complete by: As directed    Call MD for:  extreme fatigue   Complete by:  As directed    Call MD for:  hives  Complete by: As directed    Call MD for:  persistant dizziness or light-headedness   Complete by: As directed    Call MD for:  persistant nausea and vomiting   Complete by: As directed    Call MD for:  redness, tenderness, or signs of infection (pain, swelling, redness, odor or green/yellow discharge around incision site)   Complete by: As directed    Call MD for:  severe uncontrolled pain   Complete by: As directed    Call MD for:  temperature >100.4   Complete by: As directed    Diet - low sodium heart healthy   Complete by: As directed    Discharge instructions   Complete by: As directed    Continue all home meds EXCEPT Eliquis---**stop Eliquis now**; keep all appointments with GI MD; pt will hear from IR for follow up date and times   Discharge wound care:   Complete by: As directed    May shower today or tomorrow-- place new bandage on Rt groin; Rt abdomen and Rt neck after shower; keep new bandage on sites every other day x 10 days   Increase activity slowly   Complete by: As directed       Allergies as of 09/05/2022       Reactions   Chlorthalidone    Severe headaches    Metoprolol    Severe headaches         Medication List     STOP taking these medications    Eliquis 5 MG Tabs tablet Generic drug: apixaban       TAKE these medications    ciprofloxacin 500 MG tablet Commonly known as: CIPRO TAKE 1 TABLET BY MOUTH DAILY   Constulose 10 GM/15ML solution Generic drug: lactulose take 30 mls BY MOUTH TWICE DAILY What changed: See the new instructions.   cyclobenzaprine 10 MG tablet Commonly known as: FLEXERIL Take 10 mg by mouth 3 (three) times daily as needed for muscle spasms.   DULoxetine 60 MG capsule Commonly known as: CYMBALTA Take 120 mg by mouth daily.   ferrous sulfate 325 (65 FE) MG EC tablet Take 325 mg by mouth in the morning and at bedtime.   furosemide 40 MG tablet Commonly known as:  LASIX Take 40 mg by mouth 2 (two) times daily.   gabapentin 300 MG capsule Commonly known as: NEURONTIN Take 300 mg by mouth 3 (three) times daily as needed (pain.).   levOCARNitine 330 MG tablet Commonly known as: CARNITOR Take 330 mg by mouth 3 (three) times daily.   multivitamin tablet Take 1 tablet by mouth in the morning.   ondansetron 4 MG tablet Commonly known as: ZOFRAN TAKE 1 TABLET BY MOUTH EVERY 6 HOURS AS NEEDED FOR NAUSEA OR FOR VOMITING What changed: See the new instructions.   pantoprazole 40 MG tablet Commonly known as: PROTONIX Take 1 tablet (40 mg total) by mouth daily. 30 minutes before breakfast. Take instead of omeprazole   spironolactone 100 MG tablet Commonly known as: ALDACTONE Take 100 mg by mouth daily.   Vitamin D3 125 MCG (5000 UT) Tabs Generic drug: Cholecalciferol Take 5,000 Units by mouth in the morning.   Xifaxan 550 MG Tabs tablet Generic drug: rifaximin TAKE 1 TABLET BY MOUTH TWICE DAILY   Zinc 50 MG Tabs Take 50 mg by mouth in the morning.               Discharge Care Instructions  (From admission, onward)  Start     Ordered   09/05/22 0000  Discharge wound care:       Comments: May shower today or tomorrow-- place new bandage on Rt groin; Rt abdomen and Rt neck after shower; keep new bandage on sites every other day x 10 days   09/05/22 1123            Follow-up Information     Suttle, Rosanne Ashing, MD Follow up.   Specialties: Interventional Radiology, Diagnostic Radiology, Radiology Why: pt will hear from IR scheduler for appt time and date for follow up with Dr Serafina Royals; call [phone] if any questions or concerns Contact information: [address] [place] [address] [place] [address] [id number]                  Electronically Signed: Lavonia Drafts, PA-C 09/05/2022, 11:32 AM   I have spent Greater Than 30 Minutes discharging [name].

## 2022-09-05 NOTE — Progress Notes (Signed)
   09/05/22 1000  Mobility  Activity Ambulated with assistance in hallway  Level of Assistance Contact guard assist, steadying assist  Assistive Device None  Distance Ambulated (ft) 120 ft  Activity Response Tolerated well  Mobility Referral Yes  $Mobility charge 1 Mobility   Mobility Specialist Progress Note  Pre-Mobility: 98% SpO2 During Mobility: 97% SpO2 Post-Mobility: 98% SpO2  Pt was in bed and agreeable. Had c/o feeling "winded" throughout ambulation. Returned to bed w/ call bell in reach and alarm on.   Lucious Groves Mobility Specialist  Please contact via SecureChat or Rehab office at [phone]

## 2022-09-06 ENCOUNTER — Ambulatory Visit (INDEPENDENT_AMBULATORY_CARE_PROVIDER_SITE_OTHER): Payer: Medicaid Other | Admitting: Gastroenterology

## 2022-09-06 ENCOUNTER — Encounter: Payer: Self-pay | Admitting: Gastroenterology

## 2022-09-06 VITALS — BP 120/80 | HR 97 | Temp 97.7°F | Ht 68.0 in | Wt 237.8 lb

## 2022-09-06 DIAGNOSIS — K769 Liver disease, unspecified: Secondary | ICD-10-CM | POA: Diagnosis not present

## 2022-09-06 DIAGNOSIS — R531 Weakness: Secondary | ICD-10-CM | POA: Insufficient documentation

## 2022-09-06 DIAGNOSIS — K7460 Unspecified cirrhosis of liver: Secondary | ICD-10-CM | POA: Diagnosis not present

## 2022-09-06 DIAGNOSIS — R0602 Shortness of breath: Secondary | ICD-10-CM | POA: Diagnosis not present

## 2022-09-06 DIAGNOSIS — R1084 Generalized abdominal pain: Secondary | ICD-10-CM

## 2022-09-06 DIAGNOSIS — R5383 Other fatigue: Secondary | ICD-10-CM | POA: Insufficient documentation

## 2022-09-06 DIAGNOSIS — R110 Nausea: Secondary | ICD-10-CM

## 2022-09-06 LAB — TYPE AND SCREEN
ABO/RH(D): O POS
Antibody Screen: NEGATIVE
Unit division: 0
Unit division: 0

## 2022-09-06 LAB — BPAM RBC
Blood Product Expiration Date: 202404272359
Blood Product Expiration Date: 202404272359
ISSUE DATE / TIME: 202403311551
ISSUE DATE / TIME: 202403311551
Unit Type and Rh: 5100
Unit Type and Rh: 5100

## 2022-09-06 MED ORDER — ONDANSETRON HCL 4 MG PO TABS: 4.0000 mg | ORAL_TABLET | Freq: Three times a day (TID) | ORAL | 3 refills | Status: DC

## 2022-09-06 NOTE — Patient Instructions (Addendum)
Please proceed to any pain emergent see department to further evaluate your shortness of breath and fatigue.  Continue your current dose Lasix 40 mg twice daily and spironolactone 100 mg daily.  Please be sure that you are taking your correct dose of lactulose.  As you stated, Dr. Serafina Royals recommended increasing lactulose to 60 mL 3 times daily.  The goal is free to have at least 3-4 bowel movements daily.  Continue Xifaxan 550 mg twice daily.  You are due for an MRI of your liver, but I will need to discuss with Dr. Serafina Royals to see if he can have this done now that you have had TIPS.  You will be due for EGD and colonoscopy in October of this year.  We will discuss scheduling this closer to time.  Increase Zofran to 3 times daily before meals to see if this helps with your nausea.  Continue pantoprazole 40 mg daily.  Aliene Altes, PA-C [facility]

## 2022-09-07 ENCOUNTER — Telehealth (HOSPITAL_COMMUNITY): Payer: Self-pay | Admitting: Student

## 2022-09-07 NOTE — Telephone Encounter (Signed)
   [facility] TIPS post-procedure phone call follow-up   Alejandro Porter is a 47 y.o. male who underwent TIPS creation at [facility] 09/04/22 by Dr. Serafina Royals.   Patient is 3 days from procedure.   # of paracentesis in last month: 8 including one during TIPS # of paracentesis in last 2 months: 17  Current diuretic regimen: Lasix 40 mg BID, aldactone 100 mg daily  Current pharmacologic encephalopathy prophylaxis/treatment: Constulose 30 ml BID, Xifaxan 550 mg BID   Post-TIPS Imaging: none    Labs: Creatinine: 1.42 Total Bilirubin: 1.0 INR: 1.3 Sodium: 124 Albumin: 3.0 Ammonia: n/a  Assessment: Alejandro Porter is a 47 y.o. male with history of CIRRHOSIS. Patient is 3 days from TIPS creation and is feeling ok. He states yesterday he had a lot of fatigue and shortness of breath with too much activity. Today he is feeling better. He denies abdominal distention and is taking all of his medications as directed. He states his lower extremity swelling has decreased since the procedure. He has some nausea and he is having 2-3 bowel movements per day. All his procedure sites are healing well. He has no needs at this time. He  knows he can call  our clinic with any questions or concerns. He knows I will call him again next week to check on him.   Recommendation:  Continue current treatment plan with next Clinic follow up scheduled for 09/14/22.  Electronically Signed: Theresa Duty, NP 09/07/2022, 5:22 PM

## 2022-09-11 ENCOUNTER — Other Ambulatory Visit: Payer: Self-pay | Admitting: Gastroenterology

## 2022-09-11 DIAGNOSIS — K7031 Alcoholic cirrhosis of liver with ascites: Secondary | ICD-10-CM

## 2022-09-11 DIAGNOSIS — K766 Portal hypertension: Secondary | ICD-10-CM

## 2022-09-11 DIAGNOSIS — K5900 Constipation, unspecified: Secondary | ICD-10-CM

## 2022-09-11 DIAGNOSIS — I81 Portal vein thrombosis: Secondary | ICD-10-CM

## 2022-09-12 ENCOUNTER — Telehealth: Payer: Medicaid Other | Admitting: Gastroenterology

## 2022-09-12 NOTE — Telephone Encounter (Signed)
Spoke with patient.  He did not go to the emergency room after I saw him in the office.  States he called his Careers adviser, Dr. Elby Showers, who just advised that patient take it easy for the next couple of weeks.  Patient reports he is actually feeling much better today compared to when I saw him in the office.   I also talked with Dr. Elby Showers regarding the possibility of MRI.  Dr. Ethelle Lyon advised that we can proceed with an MRI status post TIPS.  Patient is also agreeable to proceeding with MRI. I recommended 6 week follow-up to ensure he continues to do ok s/p TIPS.   Mindy/Tammy: Please arrange MRI abdomen with and without contrast (liver protocol). Dx: Liver lesions, cirrhosis  Mandy: Please arrange 6 week follow-up.

## 2022-09-13 ENCOUNTER — Encounter: Payer: Self-pay | Admitting: *Deleted

## 2022-09-13 ENCOUNTER — Other Ambulatory Visit: Payer: Self-pay

## 2022-09-13 ENCOUNTER — Other Ambulatory Visit: Payer: Self-pay | Admitting: *Deleted

## 2022-09-13 DIAGNOSIS — K769 Liver disease, unspecified: Secondary | ICD-10-CM

## 2022-09-13 DIAGNOSIS — D500 Iron deficiency anemia secondary to blood loss (chronic): Secondary | ICD-10-CM

## 2022-09-13 DIAGNOSIS — R188 Other ascites: Secondary | ICD-10-CM

## 2022-09-13 NOTE — Telephone Encounter (Signed)
Pt informed that MRI scheduled for 10/05/22, check in at 11:30 am, NPO after 8:00 am morning of procedure. Verbalized understanding

## 2022-09-13 NOTE — Telephone Encounter (Signed)
[place] PA: Approval # [id number] DOS: 09/13/22-11/11/22

## 2022-09-14 ENCOUNTER — Other Ambulatory Visit: Payer: Medicaid Other

## 2022-09-14 ENCOUNTER — Ambulatory Visit: Payer: Medicaid Other | Admitting: Gastroenterology

## 2022-09-14 ENCOUNTER — Ambulatory Visit (HOSPITAL_COMMUNITY): Payer: Medicaid Other | Admitting: Clinical

## 2022-09-15 ENCOUNTER — Telehealth (HOSPITAL_COMMUNITY): Payer: Self-pay | Admitting: Student

## 2022-09-15 ENCOUNTER — Inpatient Hospital Stay: Payer: Medicaid Other | Attending: Hematology

## 2022-09-15 DIAGNOSIS — D509 Iron deficiency anemia, unspecified: Secondary | ICD-10-CM | POA: Insufficient documentation

## 2022-09-15 DIAGNOSIS — N1831 Chronic kidney disease, stage 3a: Secondary | ICD-10-CM | POA: Insufficient documentation

## 2022-09-15 NOTE — Telephone Encounter (Signed)
       [facility] TIPS post-procedure phone call follow-up  Alejandro Porter is a 47 y.o. male who underwent TIPS creation at [facility] 09/04/22 by Dr. Elby Showers.    Patient is 11 days from procedure.    # of paracentesis in last month: 8 including one during TIPS # of paracentesis in last 2 months: 17   Current diuretic regimen: Lasix 40 mg BID, aldactone 100 mg daily  Current pharmacologic encephalopathy prophylaxis/treatment: Constulose 30 ml BID, Xifaxan 550 mg BID    Post-TIPS Imaging: none    Labs: Creatinine: 1.42 Total Bilirubin: 1.0 INR: 1.3 Sodium: 124 Albumin: 3.0 Ammonia: n/a   Assessment: Alejandro Porter is a 47 y.o. male with history of CIRRHOSIS. Patient is 11 days from TIPS creation and is feeling better than when we last spoke. He continues to have fatigue requiring frequent rest breaks but states this is improving. He only occasionally has shortness of breath. He is amazed that he hasn't put on any weight and that his abdomen does not feel distended. He has not needed a paracentesis since his TIPS (previously he was once to twice weekly). He's very happy about this. He is sleeping well. He is having 2-3 bowel movements each day.   He has no questions or concerns at this time and is taking all medications as directed.    Recommendation:   Continue current treatment plan with next Clinic follow up phone call scheduled for 09/21/22.  Electronically Signed: Mickie Kay, NP 09/15/2022, 3:02 PM

## 2022-09-17 ENCOUNTER — Other Ambulatory Visit: Payer: Self-pay | Admitting: Gastroenterology

## 2022-09-20 NOTE — Progress Notes (Unsigned)
[facility] [address], [place] [address]   CLINIC:  Medical Oncology/Hematology  PCP:  Reather Converse, PA-C [address] [place] [address] [phone]   REASON FOR VISIT:  Follow-up for iron deficiency anemia, thrombocytopenia (cirrhosis), and portal vein thrombosis   CURRENT THERAPY: Intermittent IV iron, Eliquis  INTERVAL HISTORY:   Alejandro Porter 47 y.o. male returns for routine follow-up of iron deficiency anemia, thrombocytopenia, and portal vein thrombosis.  He was last seen by Rojelio Brenner PA-C on 05/15/2022.  Since last visit, he underwent TIPS procedure on 09/04/2022, in addition to periumbilical variceal embolization.  *** He continues to take Eliquis. ***He denies any bright red blood per rectum, but reports blackish watery diarrhea intermittently. *** He admits to easy bruising but denies petechial rash. *** He denies any B symptoms such as fever, chills, night sweats, unintentional weight loss.  He reports increased fatigue. *** He reports headaches, dizziness, and fatigue.  No pica, restless legs, chest pain, dyspnea on exertion, lightheadedness, or syncope.   He has ***% energy and ***% appetite. He endorses that he is maintaining a stable weight.   ASSESSMENT & PLAN:  1.  Iron deficiency anemia + anemia of CKD - He previously took iron pills which caused constipation. - Most recent IV Venofer 300 mg x 3 from 01/19/2022 through 02/03/2022 - EGD (03/24/2022): Small esophageal varices, portal hypertensive gastropathy, multiple gastric polyps, duodenitis. - Colonoscopy (03/24/2022): Nonbleeding internal hemorrhoids.  Rectal varices. - No bright red blood per rectum.  Intermittent blackish watery diarrhea.*** - Reports increased fatigue*** - Most recent labs (09/14/2022): Hgb 11.5/MCV 88.3, ferritin 57, iron saturation 40 %, creatinine 1.19/GFR 76, improved from his usual CKD stage IIIa. - PLAN: Recommend IV Feraheme x 2.   *** - If hemoglobin <9.5 despite adequate iron supplementation, would consider starting ESA in the future - Repeat labs and RTC in 4 months   ***   2.  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 - SPEP was negative.  B12, methylmalonic acid, folic acid were normal.  Hepatitis B  was negative.  H. pylori was negative. - Abdominal ultrasound (06/21/2021): Liver cirrhosis and splenomegaly (estimated splenic volume 1100 mL) - Abdominal ultrasound (11/29/2021): Interval increase in splenomegaly measuring 20.6 cm in length with estimated volume 1943 cc - TIPS procedure performed 09/04/2022 -Patient denies any major bleeding, bruising, petechial rash*** - Thrombocytopenia likely secondary to cirrhosis and splenomegaly - Most recent labs (09/14/2022): Platelets 86, stable at baseline.  Normal WBC 7.2 - PLAN: No indication for treatment at this time.  We will continue to monitor.     3.  Portal vein thrombosis  - Ultrasound Doppler of the liver on 09/11/2019 showed nonocclusive hyperechoic thrombus along the main portal vein wall posteriorly extending into the right portal vein. - Patient had stopped Eliquis in August 2023, reportedly at the instruction of his "liver doctor," but this may have been a misunderstanding on his part. - Patient has resumed his Eliquis as of November 2023.  At his last visit, he had told me that his "liver doctor" stopped his Eliquis on 01/04/2022.  However, his gastroenterology NP [name]) reached out to me in November 2023 to inform me that he was noted to have recurrent nonocclusive portal vein thrombosis.  I had recommended resuming Eliquis, which was done per GI on 04/20/2022. - TIPS procedure performed 09/04/2022 - PLAN: Continue Eliquis indefinitely.  4.  Liver cirrhosis and ascites - CTAP from [facility] on 08/17/2019 showed liver morphology consistent with cirrhosis with no  masses.  Enlarged spleen measuring 22 cm.  Nonocclusive thrombus in the portal vein.  Prominent to mildly enlarged periceliac and gastrohepatic ligament lymph nodes.  3.5 cm left adrenal mass consistent with adenoma. - Abdominal ultrasound (06/21/2021): Liver cirrhosis and splenomegaly (estimated splenic volume 1100 mL) - He follows with gastroenterology and hepatology - He has significant pain from biliary dyskinesia, considered high risk surgical candidate.  He has met with hepatobiliary surgeon at [facility] in [place] to see if he would qualify for cholecystectomy, but has been recommended to hold off on cholecystectomy due to significant risk of morbidity and mortality. - TIPS procedure 09/04/2022  PLAN SUMMARY:*** >> IV Feraheme x 2 >> Labs in 4 months = CBC/D, CMP, ferritin, iron/TIBC >> OFFICE visit in 4 months    [facility] **VISIT SUMMARY & IMPORTANT INSTRUCTIONS **   You were seen today by Rojelio Brenner PA-C for your ***.    *** ***  *** ***  LABS: Return in ***   OTHER TESTS: ***  MEDICATIONS: ***  FOLLOW-UP APPOINTMENT: ***     REVIEW OF SYSTEMS: ***  Review of Systems - Oncology   PHYSICAL EXAM:  ECOG PERFORMANCE STATUS: {CHL ONC ECOG UJ:[id number]} *** There were no vitals filed for this visit. There were no vitals filed for this visit. Physical Exam  PAST MEDICAL/SURGICAL HISTORY:  Past Medical History:  Diagnosis Date   Anxiety 08/2022   CKD (chronic kidney disease) stage 2, GFR 60-89 ml/min 05/24/2022   stage 3a   Depression 06/2022   GERD (gastroesophageal reflux disease)    H/O colonoscopy    H/O endoscopy    Headache    Hypertension    Liver cirrhosis    Neuropathy    OSA (obstructive sleep apnea)    not currently using CPAP   S/P abdominal paracentesis    Past Surgical History:  Procedure Laterality Date   BIOPSY  09/12/2019   Procedure: BIOPSY;  Surgeon: Corbin Ade, MD;  [facility];  Service: Endoscopy;;   COLONOSCOPY N/A 09/18/2019   Dr. Darrick Penna: 12 colon polyps removed, multiple simple adenomas and some inflammatory polyps, diverticulosis, external and internal hemorrhoids.  Next colonoscopy in 3 years.   COLONOSCOPY WITH PROPOFOL N/A 03/24/2022   Procedure: COLONOSCOPY WITH PROPOFOL;  Surgeon: Lanelle Bal, DO;  [facility];  Service: Endoscopy;  Laterality: N/A;  1:30pm, asa 3   ESOPHAGEAL BANDING N/A 09/12/2019   Procedure: ESOPHAGEAL BANDING;  Surgeon: Corbin Ade, MD;  [facility];  Service: Endoscopy;  Laterality: N/A;   ESOPHAGOGASTRODUODENOSCOPY N/A 09/12/2019   Dr. Jena Gauss: Mild erosive reflux esophagitis.  Schatzki ring status post dilation.  Small grade 1/grade 2 esophageal varices, portal hypertensive gastropathy, hyperplastic gastric polyp, gastric erosions with chronic inactive gastritis on biopsies, no H. pylori.   ESOPHAGOGASTRODUODENOSCOPY (EGD) WITH PROPOFOL N/A 12/16/2020   two short columns of no more than Grade 2 esophageal varices, appearing innocent. Grade 1 varices not apparent today. Multiple large posterior body and antral hyperplastic, hemorrhagic appearing polyps, larges approximately 2.5 cm pedunculated. Portal gastropathy. Normal duodenum. Polypectomy with clips placed. EGD in 18 months.   ESOPHAGOGASTRODUODENOSCOPY (EGD) WITH PROPOFOL N/A 03/24/2022   Procedure: ESOPHAGOGASTRODUODENOSCOPY (EGD) WITH PROPOFOL;  Surgeon: Lanelle Bal, DO;  [facility];  Service: Endoscopy;  Laterality: N/A;  IR ANGIOGRAM SELECTIVE EACH ADDITIONAL VESSEL  09/04/2022   IR EMBO VENOUS NOT HEMORR HEMANG  INC GUIDE ROADMAPPING  09/04/2022   IR INTRAVASCULAR ULTRASOUND NON CORONARY  09/04/2022   IR PARACENTESIS  09/04/2022   IR RADIOLOGIST EVAL & MGMT  07/19/2022   IR RADIOLOGIST EVAL & MGMT  08/14/2022   IR TIPS  09/04/2022   IR US GUIDE VASC ACCESS RIGHT  09/04/2022   POLYPECTOMY  09/18/2019   Procedure:  POLYPECTOMY;  Surgeon: West Bali, MD;  [facility];  Service: Endoscopy;;   POLYPECTOMY  12/16/2020   Procedure: POLYPECTOMY;  Surgeon: Corbin Ade, MD;  [facility];  Service: Endoscopy;;  gastric   POLYPECTOMY  03/24/2022   Procedure: POLYPECTOMY;  Surgeon: Lanelle Bal, DO;  [facility];  Service: Endoscopy;;   RADIOLOGY WITH ANESTHESIA N/A 09/04/2022   Procedure: TIPS;  Surgeon: Bennie Dallas, MD;  [facility]: [facility];  Service: Radiology;  Laterality: N/A;    SOCIAL HISTORY:  Social History   Socioeconomic History   Marital status: Single    Spouse name: Not on file   Number of children: Not on file   Years of education: Not on file   Highest education level: Not on file  Occupational History   Occupation: umemployed  Tobacco Use   Smoking status: Never    Passive exposure: Current   Smokeless tobacco: Never  Vaping Use   Vaping Use: Never used  Substance and Sexual Activity   Alcohol use: Not Currently   Drug use: Never   Sexual activity: Not Currently  Other Topics Concern   Not on file  Social History Narrative   Not on file   Social Determinants of Health   Financial Resource Strain: Not on file  Food Insecurity: Not on file  Transportation Needs: Not on file  Physical Activity: Not on file  Stress: Not on file  Social Connections: Not on file  Intimate Partner Violence: Not on file    FAMILY HISTORY:  Family History  Problem Relation Age of Onset   Brain cancer Mother    Diabetes Mother    Diabetes Father    Pulmonary embolism Brother    Liver disease Neg Hx     CURRENT MEDICATIONS:  Outpatient Encounter Medications as of 09/21/2022  Medication Sig   Cholecalciferol (VITAMIN D3) 125 MCG (5000 UT) TABS Take 5,000 Units by mouth in the morning.   ciprofloxacin (CIPRO) 500 MG tablet TAKE 1 TABLET BY MOUTH DAILY   cyclobenzaprine (FLEXERIL) 10 MG tablet Take 10 mg by mouth 3 (three) times daily as needed  for muscle spasms.   DULoxetine (CYMBALTA) 60 MG capsule Take 120 mg by mouth daily.   ferrous sulfate 325 (65 FE) MG EC tablet Take 325 mg by mouth in the morning and at bedtime.   furosemide (LASIX) 40 MG tablet Take 40 mg by mouth 2 (two) times daily.   gabapentin (NEURONTIN) 300 MG capsule Take 300 mg by mouth 3 (three) times daily as needed (pain.).   lactulose (CHRONULAC) 10 GM/15ML solution Take 60 mLs (40 g total) by mouth 3 (three) times daily. Titrate to goal of 3-4 BMs daily.   levOCARNitine (CARNITOR) 330 MG tablet Take 330 mg by mouth 3 (three) times daily.   Multiple Vitamin (MULTIVITAMIN) tablet Take 1 tablet by mouth in the morning.   ondansetron (ZOFRAN) 4 MG tablet Take 1 tablet (4 mg total) by mouth 3 (three) times daily before  meals.   pantoprazole (PROTONIX) 40 MG tablet TAKE 1 TABLET BY MOUTH DAILY 30 MINUTES BEFORE BREAKFAST, take instead of omeprazole   spironolactone (ALDACTONE) 100 MG tablet Take 100 mg by mouth daily.   XIFAXAN 550 MG TABS tablet TAKE 1 TABLET BY MOUTH TWICE DAILY   Zinc 50 MG TABS Take 50 mg by mouth in the morning.   No facility-administered encounter medications on file as of 09/21/2022.    ALLERGIES:  Allergies  Allergen Reactions   Chlorthalidone     Severe headaches    Metoprolol     Severe headaches     LABORATORY DATA:  I have reviewed the labs as listed.  CBC    Component Value Date/Time   WBC 6.5 09/05/2022 0739   RBC 3.23 (L) 09/05/2022 0739   HGB 9.7 (L) 09/05/2022 0739   HCT 28.7 (L) 09/05/2022 0739   PLT 67 (L) 09/05/2022 0739   MCV 88.9 09/05/2022 0739   MCH 30.0 09/05/2022 0739   MCHC 33.8 09/05/2022 0739   RDW 15.1 09/05/2022 0739   LYMPHSABS 0.4 (L) 05/15/2022 0801   MONOABS 0.7 05/15/2022 0801   EOSABS 0.2 05/15/2022 0801   BASOSABS 0.0 05/15/2022 0801      Latest Ref Rng & Units 09/05/2022    7:39 AM 09/04/2022    5:59 AM 08/03/2022   11:02 AM  CMP  Glucose 70 - 99 mg/dL 161  096  85   BUN 6 - 20 mg/dL Creatinine 0.61 - 1.24 mg/dL 0.45  4.09  8.11   Sodium 135 - 145 mmol/L 124  124  125   Potassium 3.5 - 5.1 mmol/L 4.5  4.3  4.9   Chloride 98 - 111 mmol/L 95  92  95   CO2 22 - 32 mmol/L Calcium 8.9 - 10.3 mg/dL 8.9  8.8  8.9   Total Protein 6.5 - 8.1 g/dL 5.9   7.1   Total Bilirubin 0.3 - 1.2 mg/dL 1.0   1.1   Alkaline Phos 38 - 126 U/L 113   120   AST 15 - 41 U/L 133   45   ALT 0 - 44 U/L 88   34     DIAGNOSTIC IMAGING:  I have independently reviewed the relevant imaging and discussed with the patient.   WRAP UP:  All questions were answered. The patient knows to call the clinic with any problems, questions or concerns.  Medical decision making: ***  Time spent on visit: I spent *** minutes counseling the patient face to face. The total time spent in the appointment was *** minutes and more than 50% was on counseling.  Carnella Guadalajara, PA-C  ***

## 2022-09-21 ENCOUNTER — Other Ambulatory Visit: Payer: Self-pay

## 2022-09-21 ENCOUNTER — Inpatient Hospital Stay: Payer: Medicaid Other | Admitting: Physician Assistant

## 2022-09-21 ENCOUNTER — Ambulatory Visit: Payer: Medicaid Other | Admitting: Physician Assistant

## 2022-09-21 DIAGNOSIS — N1831 Chronic kidney disease, stage 3a: Secondary | ICD-10-CM | POA: Diagnosis not present

## 2022-09-21 DIAGNOSIS — D500 Iron deficiency anemia secondary to blood loss (chronic): Secondary | ICD-10-CM | POA: Diagnosis not present

## 2022-09-21 DIAGNOSIS — D509 Iron deficiency anemia, unspecified: Secondary | ICD-10-CM | POA: Diagnosis present

## 2022-09-21 DIAGNOSIS — D696 Thrombocytopenia, unspecified: Secondary | ICD-10-CM

## 2022-09-21 DIAGNOSIS — I81 Portal vein thrombosis: Secondary | ICD-10-CM | POA: Diagnosis not present

## 2022-09-21 NOTE — Patient Instructions (Signed)
[facility] at [facility] Discharge Instructions  You were seen today by Rojelio Brenner PA-C for your iron deficiency anemia, low platelets, and portal vein thrombosis (blood clot in your liver).  IRON DEFICIENCY ANEMIA: Your blood and iron levels are slightly low.  We will schedule you for IV iron x 2 doses.  LOW PLATELETS: You have low platelets because of your liver cirrhosis.  They are mildly low, but stable at your usual baseline.  You do not need treatment for that at this time.  PORTAL VEIN THROMBOSIS (blood clot in your liver): Your Eliquis was stopped after your TIPS procedure.  LABS: Return in 4 months for repeat labs  FOLLOW-UP APPOINTMENT: Office visit after labs   - - - - - - - - - - - - - - - - - -   Thank you for choosing [facility] at [facility] to provide your oncology and hematology care.  To afford each patient quality time with our provider, please arrive at least 15 minutes before your scheduled appointment time.   If you have a lab appointment with the Cancer Center please come in thru the Main Entrance and check in at the main information desk.  You need to re-schedule your appointment should you arrive 10 or more minutes late.  We strive to give you quality time with our providers, and arriving late affects you and other patients whose appointments are after yours.  Also, if you no show three or more times for appointments you may be dismissed from the clinic at the providers discretion.     Again, thank you for choosing [facility].  Our hope is that these requests will decrease the amount of time that you wait before being seen by our physicians.       _____________________________________________________________  Should you have questions after your visit to [facility], please contact our office at [phone] and follow the prompts.  Our office hours are 8:00 a.m. and 4:30 p.m. Monday  - Friday.  Please note that voicemails left after 4:00 p.m. may not be returned until the following business day.  We are closed weekends and major holidays.  You do have access to a nurse 24-7, just call the main number to the clinic [phone] and do not press any options, hold on the line and a nurse will answer the phone.    For prescription refill requests, have your pharmacy contact our office and allow 72 hours.    Due to Covid, you will need to wear a mask upon entering the hospital. If you do not have a mask, a mask will be given to you at the Main Entrance upon arrival. For doctor visits, patients may have 1 support person age 36 or older with them. For treatment visits, patients can not have anyone with them due to social distancing guidelines and our immunocompromised population.

## 2022-09-24 ENCOUNTER — Telehealth (HOSPITAL_COMMUNITY): Payer: Self-pay | Admitting: Student

## 2022-09-24 NOTE — Telephone Encounter (Signed)
Called to speak with patient for a TIPS phone call check in. No answer - voice mail message left. Patient scheduled for TIPS clinic evaluation 09/29/22.   Alwyn Ren, [place] [phone] 09/24/2022, 1:21 PM

## 2022-09-25 ENCOUNTER — Inpatient Hospital Stay: Payer: Medicaid Other

## 2022-09-25 VITALS — BP 121/72 | HR 87 | Temp 98.2°F | Resp 18

## 2022-09-25 DIAGNOSIS — D509 Iron deficiency anemia, unspecified: Secondary | ICD-10-CM | POA: Diagnosis not present

## 2022-09-25 DIAGNOSIS — D500 Iron deficiency anemia secondary to blood loss (chronic): Secondary | ICD-10-CM

## 2022-09-25 MED ORDER — CETIRIZINE HCL 10 MG PO TABS
10.0000 mg | ORAL_TABLET | Freq: Once | ORAL | Status: AC
  Administered 2022-09-25: 10 mg via ORAL
  Filled 2022-09-25: qty 1

## 2022-09-25 MED ORDER — LORATADINE 10 MG PO TABS
10.0000 mg | ORAL_TABLET | Freq: Once | ORAL | Status: DC
  Filled 2022-09-25: qty 1

## 2022-09-25 MED ORDER — ACETAMINOPHEN 325 MG PO TABS: 650.0000 mg | ORAL_TABLET | Freq: Once | ORAL | Status: DC

## 2022-09-25 MED ORDER — SODIUM CHLORIDE 0.9 % IV SOLN
510.0000 mg | Freq: Once | INTRAVENOUS | Status: AC
  Administered 2022-09-25: 510 mg via INTRAVENOUS
  Filled 2022-09-25: qty 510

## 2022-09-25 MED ORDER — SODIUM CHLORIDE 0.9 % IV SOLN: Freq: Once | INTRAVENOUS | Status: AC

## 2022-09-25 NOTE — Patient Instructions (Signed)
[facility]  Discharge Instructions: Thank you for choosing [facility] to provide your oncology and hematology care.  If you have a lab appointment with the Cancer Center - please note that after April 8th, 2024, all labs will be drawn in the cancer center.  You do not have to check in or register with the main entrance as you have in the past but will complete your check-in in the cancer center.  Wear comfortable clothing and clothing appropriate for easy access to any Portacath or PICC line.   We strive to give you quality time with your provider. You may need to reschedule your appointment if you arrive late (15 or more minutes).  Arriving late affects you and other patients whose appointments are after yours.  Also, if you miss three or more appointments without notifying the office, you may be dismissed from the clinic at the provider's discretion.      For prescription refill requests, have your pharmacy contact our office and allow 72 hours for refills to be completed.    Today you received the following chemotherapy and/or immunotherapy agents Feraheme      To help prevent nausea and vomiting after your treatment, we encourage you to take your nausea medication as directed.  BELOW ARE SYMPTOMS THAT SHOULD BE REPORTED IMMEDIATELY: *FEVER GREATER THAN 100.4 F (38 C) OR HIGHER *CHILLS OR SWEATING *NAUSEA AND VOMITING THAT IS NOT CONTROLLED WITH YOUR NAUSEA MEDICATION *UNUSUAL SHORTNESS OF BREATH *UNUSUAL BRUISING OR BLEEDING *URINARY PROBLEMS (pain or burning when urinating, or frequent urination) *BOWEL PROBLEMS (unusual diarrhea, constipation, pain near the anus) TENDERNESS IN MOUTH AND THROAT WITH OR WITHOUT PRESENCE OF ULCERS (sore throat, sores in mouth, or a toothache) UNUSUAL RASH, SWELLING OR PAIN  UNUSUAL VAGINAL DISCHARGE OR ITCHING   Items with * indicate a potential emergency and should be followed up as soon as possible or go to the  Emergency Department if any problems should occur.  Please show the CHEMOTHERAPY ALERT CARD or IMMUNOTHERAPY ALERT CARD at check-in to the Emergency Department and triage nurse.  Should you have questions after your visit or need to cancel or reschedule your appointment, please contact [facility] [phone]  and follow the prompts.  Office hours are 8:00 a.m. to 4:30 p.m. Monday - Friday. Please note that voicemails left after 4:00 p.m. may not be returned until the following business day.  We are closed weekends and major holidays. You have access to a nurse at all times for urgent questions. Please call the main number to the clinic [phone] and follow the prompts.  For any non-urgent questions, you may also contact your provider using MyChart. We now offer e-Visits for anyone 18 and older to request care online for non-urgent symptoms. For details visit [email or web address].   Also download the MyChart app! Go to the app store, search "MyChart", open the app, select [place], and log in with your MyChart username and password.   

## 2022-09-25 NOTE — Progress Notes (Signed)
Patient presents today for Feraheme infusion per providers order.  Vital signs WNL.  Patient had a TIPs procedure approximately one moth ago and states that he has had and currently having SOB, chest pain, no appetite, shoulder and leg pain since the procedure.  Patient states that he sees the surgeon on Thursday but has not contacted anyone about his complaints.  PA notified.  Pa to see the patient to determine the next course of action.   PA evaluated patient.  Message received from PA, okay to proceed with Feraheme.  Peripheral IV started and blood return noted pre and post infusion.  Stable during infusion without adverse affects.  Vital signs stable.  No complaints at this time.  Discharge from clinic ambulatory in stable condition.  Alert and oriented X 3.  Follow up with [facility] as scheduled.

## 2022-09-25 NOTE — Progress Notes (Signed)
Patient evaluated prior to iron infusion  due to complaints of chest pain and shortness of breath.   Patient reports ongoing sharp right-sided chest pain that "shoots across his chest."  Pain has been ongoing for the past week, relieved only with gabapentin.  Worsened when gabapentin wears off.  No association with exertion or other symptoms.  Ongoing DOE, nausea, and poor appetite from cirrhosis and other medical comorbidities.  Vitals are within acceptable parameters. Precordial chest wall markedly tender to palpation, although patient was also noted to have anticipatory grimace and moan prior to pressing down on chest wall. Mild tachypnea without any labored breathing. Lungs CTA B/L.  At this time, chest pain is not concerning for cardiac etiology.  We will proceed with iron today.  If any new or worsening symptoms, would consider ED evaluation.  Patient instructed to reach out to his GI re: his gastrointestinal symptoms.  Carnella Guadalajara, PA-C  09/25/22 2:51 PM

## 2022-09-25 NOTE — Progress Notes (Signed)
Pharmacy has substituted cetirizine 10 mg orally x 1 as premedication for   Loratidine discontinued.  V.O. Rebekah Pennington, PA-C/Wille Aubuchon, PharmD  

## 2022-09-29 ENCOUNTER — Ambulatory Visit
Admission: RE | Admit: 2022-09-29 | Discharge: 2022-09-29 | Disposition: A | Discharge status: Home / Self Care | Payer: Medicaid Other | Source: Ambulatory Visit | Attending: Radiology | Admitting: Radiology

## 2022-09-29 DIAGNOSIS — K766 Portal hypertension: Secondary | ICD-10-CM

## 2022-09-29 HISTORY — PX: IR RADIOLOGIST EVAL & MGMT: IMG5224

## 2022-09-29 NOTE — Progress Notes (Signed)
Reason for follow up: Patient presents via virtual telephone visit today for initial follow up after TIPS creation on 09/04/22.   Referring Physician(s): Earnest Bailey, DO, [name], NP, Annamarie Major, NP    History of Present Illness: Initial HPI (07/19/22):  Alejandro Porter is a 47 y.o. 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     Etiology of cirrhosis: MASH Initially diagnosed: 2021 # of paracentesis in last month: 7 # of paracentesis in last 2 months: 8 History of hepatic hydrothorax:  no History of hepatic encephalopathy: yes   Prior evaluation for liver transplant: Undergoing evaluation with [facility] currently, deemed not a candidate per patient due to transportation/social support factors History of hepatocellular carcinoma: no, 2 LIRADS 3 masses   Prior esophagogastroduodenoscopy/intervention: 03/24/22 - small esophageal varices and portal hypetensive gastropathy, no intervention Current esophageal varices: yes Current gastric varices: no History of hematemesis: no   Current diuretic regimen: furosemide 40 mg BID, spironolactone 100 mg QD Current pharmacologic encephalopathy prophylaxis/treatment: lactulose BID + rifaximin   History of renal dysfunction: yes, CKD IIIa History of hemodialysis: no   History of cardiac dysfunction: no   Other pertinent past medical history: GERD, hypertension, obesity, neuropathy, obstructive sleep apnea    He is status post TIPS creation, paracentesis, and paraumbilical vein coil embolization on 09/04/22.  He has not required paracentesis since the procedure.  He complains of having very low energy and appetite.  He has had some nausea, but no vomiting.  He complains of a pain across his chest which has responded to taking gabapentin.  He is not short of breath.  The chest pain doesn't radiate.  He has remained compliant  with lactulose and rifaximin, without encephalopathy.     Past Medical History:  Diagnosis Date   Anxiety 08/2022   CKD (chronic kidney disease) stage 2, GFR 60-89 ml/min 05/24/2022   stage 3a   Depression 06/2022   GERD (gastroesophageal reflux disease)    H/O colonoscopy    H/O endoscopy    Headache    Hypertension    Liver cirrhosis (HCC)    Neuropathy    OSA (obstructive sleep apnea)    not currently using CPAP   S/P abdominal paracentesis     Past Surgical History:  Procedure Laterality Date   BIOPSY  09/12/2019   Procedure: BIOPSY;  Surgeon: Corbin Ade, MD;  [facility]: [facility];  Service: Endoscopy;;   COLONOSCOPY N/A 09/18/2019   Dr. Darrick Penna: 12 colon polyps removed, multiple simple adenomas and some inflammatory polyps, diverticulosis, external and internal hemorrhoids.  Next colonoscopy in 3 years.   COLONOSCOPY WITH PROPOFOL N/A 03/24/2022   Procedure: COLONOSCOPY WITH PROPOFOL;  Surgeon: Lanelle Bal, DO;  [facility]: [facility];  Service: Endoscopy;  Laterality: N/A;  1:30pm, asa 3   ESOPHAGEAL BANDING N/A 09/12/2019   Procedure: ESOPHAGEAL BANDING;  Surgeon: Corbin Ade, MD;  [facility];  Service: Endoscopy;  Laterality: N/A;   ESOPHAGOGASTRODUODENOSCOPY N/A 09/12/2019   Dr. Jena Gauss: Mild erosive reflux esophagitis.  Schatzki ring status post dilation.  Small grade 1/grade 2 esophageal varices, portal hypertensive gastropathy, hyperplastic gastric polyp, gastric erosions with chronic inactive gastritis on biopsies, no H. pylori.   ESOPHAGOGASTRODUODENOSCOPY (EGD) WITH PROPOFOL N/A 12/16/2020   two short columns of no more than Grade 2 esophageal varices, appearing innocent. Grade 1 varices not apparent today. Multiple large  posterior body and antral hyperplastic, hemorrhagic appearing polyps, larges approximately 2.5 cm pedunculated. Portal gastropathy. Normal duodenum. Polypectomy with clips placed. EGD in 18 months.    ESOPHAGOGASTRODUODENOSCOPY (EGD) WITH PROPOFOL N/A 03/24/2022   Procedure: ESOPHAGOGASTRODUODENOSCOPY (EGD) WITH PROPOFOL;  Surgeon: Lanelle Bal, DO;  [facility];  Service: Endoscopy;  Laterality: N/A;   IR ANGIOGRAM SELECTIVE EACH ADDITIONAL VESSEL  09/04/2022   IR EMBO VENOUS NOT HEMORR HEMANG  INC GUIDE ROADMAPPING  09/04/2022   IR INTRAVASCULAR ULTRASOUND NON CORONARY  09/04/2022   IR PARACENTESIS  09/04/2022   IR RADIOLOGIST EVAL & MGMT  07/19/2022   IR RADIOLOGIST EVAL & MGMT  08/14/2022   IR TIPS  09/04/2022   IR US GUIDE VASC ACCESS RIGHT  09/04/2022   POLYPECTOMY  09/18/2019   Procedure: POLYPECTOMY;  Surgeon: West Bali, MD;  [facility];  Service: Endoscopy;;   POLYPECTOMY  12/16/2020   Procedure: POLYPECTOMY;  Surgeon: Corbin Ade, MD;  [facility];  Service: Endoscopy;;  gastric   POLYPECTOMY  03/24/2022   Procedure: POLYPECTOMY;  Surgeon: Lanelle Bal, DO;  [facility];  Service: Endoscopy;;   RADIOLOGY WITH ANESTHESIA N/A 09/04/2022   Procedure: TIPS;  Surgeon: Bennie Dallas, MD;  Location: [facility];  Service: Radiology;  Laterality: N/A;    Allergies: Chlorthalidone and Metoprolol  Medications: Prior to Admission medications   Medication Sig Start Date End Date Taking? Authorizing Provider  Cholecalciferol (VITAMIN D3) 125 MCG (5000 UT) TABS Take 5,000 Units by mouth in the morning. 06/17/19   [provider]  ciprofloxacin (CIPRO) 500 MG tablet TAKE 1 TABLET BY MOUTH DAILY 07/12/22   Gelene Mink, NP  cyclobenzaprine (FLEXERIL) 10 MG tablet Take 10 mg by mouth 3 (three) times daily as needed for muscle spasms. 09/29/20   [provider]  DULoxetine (CYMBALTA) 60 MG capsule Take 120 mg by mouth daily. 05/03/22   [provider]  ferrous sulfate 325 (65 FE) MG EC tablet Take 325 mg by mouth in the morning and at bedtime.    [provider]  furosemide (LASIX) 40 MG tablet Take 40 mg by  mouth 2 (two) times daily. 01/23/22   [provider]  gabapentin (NEURONTIN) 300 MG capsule Take 300 mg by mouth 3 (three) times daily as needed (pain.).    [provider]  lactulose (CHRONULAC) 10 GM/15ML solution Take 60 mLs (40 g total) by mouth 3 (three) times daily. Titrate to goal of 3-4 BMs daily. 09/18/22   Letta Median, PA-C  levOCARNitine (CARNITOR) 330 MG tablet Take 330 mg by mouth 3 (three) times daily. 09/01/20   [provider]  Multiple Vitamin (MULTIVITAMIN) tablet Take 1 tablet by mouth in the morning.    [provider]  ondansetron (ZOFRAN) 4 MG tablet Take 1 tablet (4 mg total) by mouth 3 (three) times daily before meals. 09/06/22   Letta Median, PA-C  pantoprazole (PROTONIX) 40 MG tablet TAKE 1 TABLET BY MOUTH DAILY 30 MINUTES BEFORE BREAKFAST, take instead of omeprazole 09/11/22   Letta Median, PA-C  spironolactone (ALDACTONE) 100 MG tablet Take 100 mg by mouth daily.    [provider]  XIFAXAN 550 MG TABS tablet TAKE 1 TABLET BY MOUTH TWICE DAILY 09/11/22   Letta Median, PA-C  Zinc 50 MG TABS Take 50 mg by mouth in the morning.    [provider]     Family History  Problem Relation Age of Onset   Brain cancer Mother    Diabetes Mother    Diabetes Father    Pulmonary embolism Brother    Liver disease Neg Hx     Social History   Socioeconomic History   Marital status: Single    Spouse name: Not on file   Number of children: Not on file   Years of education: Not on file   Highest education level: Not on file  Occupational History   Occupation: umemployed  Tobacco Use   Smoking status: Never    Passive exposure: Current   Smokeless tobacco: Never  Vaping Use   Vaping Use: Never used  Substance and Sexual Activity   Alcohol use: Not Currently   Drug use: Never   Sexual activity: Not Currently  Other Topics Concern   Not on file  Social History Narrative   Not on file   Social  Determinants of Health   Financial Resource Strain: Not on file  Food Insecurity: Not on file  Transportation Needs: Not on file  Physical Activity: Not on file  Stress: Not on file  Social Connections: Not on file     Vital Signs: There were no vitals taken for this visit.  No physical examination was performed in lieu of virtual telephone clinic visit.   Imaging: TIPS 09/04/22   Labs:  CBC: Recent Labs    05/15/22 0801 08/03/22 1102 09/04/22 0559 09/05/22 0739  WBC 6.0 5.3 4.2 6.5  HGB 12.5* 10.4* 10.8* 9.7*  HCT 36.5* 32.2* 31.5* 28.7*  PLT 82* 88* 69* 67*    COAGS: Recent Labs    03/15/22 1053 08/03/22 1102 09/04/22 0559 09/05/22 0739  INR 1.1 1.4* 1.2 1.3*    BMP: Recent Labs    05/15/22 0801 05/22/22 1414 07/17/22 1107 08/03/22 1102 09/04/22 0559 09/05/22 0739  NA 126*   < > 124* 125* 124* 124*  K 4.6   < > 5.2 4.9 4.3 4.5  CL 94*   < > 94* 95* 92* 95*  CO2 22   < > 22 22 19* 18*  GLUCOSE 116*   < > 87 85 109* 112*  BUN 27*   < > 19 23* 21* 17  CALCIUM 9.2   < > 9.0 8.9 8.8* 8.9  CREATININE 1.64*   < > 1.38* 1.49* 1.42* 1.22  GFRNONAA 52*  --   --  58* >60 >60   < > = values in this interval not displayed.    LIVER FUNCTION TESTS: Recent Labs    01/13/22 0803 03/15/22 1053 05/15/22 0801 08/03/22 1102 09/05/22 0739  BILITOT 1.1 1.0 1.0 1.1 1.0  AST 45* 42* 51* 45* 133*  ALT 47* 42 47* 34 88*  ALKPHOS 110  --  127* 120 113  PROT 7.5 8.2* 8.2* 7.1 5.9*  ALBUMIN 3.6  --  3.8 3.6 3.0*    Assessment and Plan: Ismaeel Arvelo is a 47 y.o. 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  He is apparently a poor candidate for transplant due to social factors. He is now status post TIPS creation on 09/04/22.  He has not had a paracentesis since the TIPS.  He does complain now of several weeks of  low energy, no appetite, and some chest pain.    -I will reach out to [name] to help coordinate obtaining CBC,  CMP, and INR as well as chest x-ray -Follow up in next available clinic date with me to assess progress/results   Marliss Coots, MD Pager: [phone] Clinic: [phone]    I spent a total of 25 Minutes in virtual telephone clinical consultation, greater than 50% of which was counseling/coordinating care for portal hypertension.

## 2022-10-02 ENCOUNTER — Ambulatory Visit: Payer: Medicaid Other

## 2022-10-03 ENCOUNTER — Inpatient Hospital Stay: Payer: Medicaid Other

## 2022-10-04 ENCOUNTER — Telehealth: Payer: Self-pay | Admitting: Gastroenterology

## 2022-10-04 ENCOUNTER — Other Ambulatory Visit: Payer: Self-pay

## 2022-10-04 DIAGNOSIS — K7460 Unspecified cirrhosis of liver: Secondary | ICD-10-CM

## 2022-10-04 DIAGNOSIS — R0602 Shortness of breath: Secondary | ICD-10-CM

## 2022-10-04 DIAGNOSIS — K7682 Hepatic encephalopathy: Secondary | ICD-10-CM

## 2022-10-04 DIAGNOSIS — R188 Other ascites: Secondary | ICD-10-CM

## 2022-10-04 NOTE — Addendum Note (Signed)
Addended by: Armstead Peaks on: 10/04/2022 11:14 AM   Modules accepted: Orders

## 2022-10-04 NOTE — Telephone Encounter (Signed)
Order already in fro CXR, no appt needed. Just needs to go to radiology. Please relay to pt once you call him regarding labs. Thanks!

## 2022-10-04 NOTE — Telephone Encounter (Signed)
Phoned and spoke with the pt and advised of his labs/CXR  to be completed. Pt states he will do it tomorrow because he has to be @ [facility] for MRI.

## 2022-10-04 NOTE — Telephone Encounter (Signed)
  Dr. Elby Showers reached out to me and requested help with coordinating labs and CXR for patient.   Dena: please have patient complete CBC, CMP and INR.  Mindy/Tammy: we need a CXR due to shortness of breath. Thanks!

## 2022-10-05 ENCOUNTER — Ambulatory Visit (HOSPITAL_COMMUNITY)
Admission: RE | Admit: 2022-10-05 | Discharge: 2022-10-05 | Disposition: A | Discharge status: Home / Self Care | Payer: Medicaid Other | Source: Ambulatory Visit | Attending: Gastroenterology | Admitting: Gastroenterology

## 2022-10-05 DIAGNOSIS — K769 Liver disease, unspecified: Secondary | ICD-10-CM

## 2022-10-05 DIAGNOSIS — R188 Other ascites: Secondary | ICD-10-CM

## 2022-10-05 DIAGNOSIS — R0602 Shortness of breath: Secondary | ICD-10-CM | POA: Diagnosis present

## 2022-10-12 ENCOUNTER — Inpatient Hospital Stay: Payer: Medicaid Other | Attending: Hematology

## 2022-10-12 VITALS — BP 111/64 | HR 85 | Temp 98.0°F | Resp 18

## 2022-10-12 DIAGNOSIS — N1831 Chronic kidney disease, stage 3a: Secondary | ICD-10-CM | POA: Diagnosis not present

## 2022-10-12 DIAGNOSIS — D500 Iron deficiency anemia secondary to blood loss (chronic): Secondary | ICD-10-CM

## 2022-10-12 DIAGNOSIS — D509 Iron deficiency anemia, unspecified: Secondary | ICD-10-CM | POA: Insufficient documentation

## 2022-10-12 MED ORDER — ACETAMINOPHEN 325 MG PO TABS: 650.0000 mg | ORAL_TABLET | Freq: Once | ORAL | Status: DC

## 2022-10-12 MED ORDER — SODIUM CHLORIDE 0.9 % IV SOLN
510.0000 mg | Freq: Once | INTRAVENOUS | Status: AC
  Administered 2022-10-12: 510 mg via INTRAVENOUS
  Filled 2022-10-12: qty 510

## 2022-10-12 MED ORDER — CETIRIZINE HCL 10 MG PO TABS
10.0000 mg | ORAL_TABLET | Freq: Once | ORAL | Status: AC
  Administered 2022-10-12: 10 mg via ORAL
  Filled 2022-10-12: qty 1

## 2022-10-12 MED ORDER — SODIUM CHLORIDE 0.9 % IV SOLN: Freq: Once | INTRAVENOUS | Status: AC

## 2022-10-12 NOTE — Progress Notes (Signed)
Patient presents today for Feraheme infusion. MAR reviewed and updated. Vital signs stable. Patient has no complaints of any side effects related to iron infusion.   Feraheme given today per MD orders. Tolerated infusion without adverse affects. Vital signs stable. No complaints at this time. Discharged from clinic ambulatory in stable condition. Alert and oriented x 3. F/U with [facility] as scheduled.

## 2022-10-12 NOTE — Patient Instructions (Signed)
[facility]  Discharge Instructions: Thank you for choosing  Cancer Center to provide your oncology and hematology care.  If you have a lab appointment with the Cancer Center - please note that after April 8th, 2024, all labs will be drawn in the cancer center.  You do not have to check in or register with the main entrance as you have in the past but will complete your check-in in the cancer center.  Wear comfortable clothing and clothing appropriate for easy access to any Portacath or PICC line.   We strive to give you quality time with your provider. You may need to reschedule your appointment if you arrive late (15 or more minutes).  Arriving late affects you and other patients whose appointments are after yours.  Also, if you miss three or more appointments without notifying the office, you may be dismissed from the clinic at the provider's discretion.      For prescription refill requests, have your pharmacy contact our office and allow 72 hours for refills to be completed.    Today you received the following chemotherapy and/or immunotherapy agents Ferumoxytol Injection What is this medication? FERUMOXYTOL (FER ue MOX i tol) treats low levels of iron in your body (iron deficiency anemia). Iron is a mineral that plays an important role in making red blood cells, which carry oxygen from your lungs to the rest of your body. This medicine may be used for other purposes; ask your health care provider or pharmacist if you have questions. COMMON BRAND NAME(S): Feraheme What should I tell my care team before I take this medication? They need to know if you have any of these conditions: Anemia not caused by low iron levels High levels of iron in the blood Magnetic resonance imaging (MRI) test scheduled An unusual or allergic reaction to iron, other medications, foods, dyes, or preservatives Pregnant or trying to get pregnant Breastfeeding How should I use this  medication? This medication is injected into a vein. It is given by your care team in a hospital or clinic setting. Talk to your care team the use of this medication in children. Special care may be needed. Overdosage: If you think you have taken too much of this medicine contact a poison control center or emergency room at once. NOTE: This medicine is only for you. Do not share this medicine with others. What if I miss a dose? It is important not to miss your dose. Call your care team if you are unable to keep an appointment. What may interact with this medication? Other iron products This list may not describe all possible interactions. Give your health care provider a list of all the medicines, herbs, non-prescription drugs, or dietary supplements you use. Also tell them if you smoke, drink alcohol, or use illegal drugs. Some items may interact with your medicine. What should I watch for while using this medication? Visit your care team regularly. Tell your care team if your symptoms do not start to get better or if they get worse. You may need blood work done while you are taking this medication. You may need to follow a special diet. Talk to your care team. Foods that contain iron include: whole grains/cereals, dried fruits, beans, or peas, leafy green vegetables, and organ meats (liver, kidney). What side effects may I notice from receiving this medication? Side effects that you should report to your care team as soon as possible: Allergic reactions--skin rash, itching, hives, swelling of  the face, lips, tongue, or throat Low blood pressure--dizziness, feeling faint or lightheaded, blurry vision Shortness of breath Side effects that usually do not require medical attention (report to your care team if they continue or are bothersome): Flushing Headache Joint pain Muscle pain Nausea Pain, redness, or irritation at injection site This list may not describe all possible side effects. Call  your doctor for medical advice about side effects. You may report side effects to FDA at 1-800-FDA-1088. Where should I keep my medication? This medication is given in a hospital or clinic and will not be stored at home. NOTE: This sheet is a summary. It may not cover all possible information. If you have questions about this medicine, talk to your doctor, pharmacist, or health care provider.  2023 Elsevier/Gold Standard (2020-10-13 00:00:00)       To help prevent nausea and vomiting after your treatment, we encourage you to take your nausea medication as directed.  BELOW ARE SYMPTOMS THAT SHOULD BE REPORTED IMMEDIATELY: *FEVER GREATER THAN 100.4 F (38 C) OR HIGHER *CHILLS OR SWEATING *NAUSEA AND VOMITING THAT IS NOT CONTROLLED WITH YOUR NAUSEA MEDICATION *UNUSUAL SHORTNESS OF BREATH *UNUSUAL BRUISING OR BLEEDING *URINARY PROBLEMS (pain or burning when urinating, or frequent urination) *BOWEL PROBLEMS (unusual diarrhea, constipation, pain near the anus) TENDERNESS IN MOUTH AND THROAT WITH OR WITHOUT PRESENCE OF ULCERS (sore throat, sores in mouth, or a toothache) UNUSUAL RASH, SWELLING OR PAIN  UNUSUAL VAGINAL DISCHARGE OR ITCHING   Items with * indicate a potential emergency and should be followed up as soon as possible or go to the Emergency Department if any problems should occur.  Please show the CHEMOTHERAPY ALERT CARD or IMMUNOTHERAPY ALERT CARD at check-in to the Emergency Department and triage nurse.  Should you have questions after your visit or need to cancel or reschedule your appointment, please contact [facility] [phone]  and follow the prompts.  Office hours are 8:00 a.m. to 4:30 p.m. Monday - Friday. Please note that voicemails left after 4:00 p.m. may not be returned until the following business day.  We are closed weekends and major holidays. You have access to a nurse at all times for urgent questions. Please call the main number to the clinic  [phone] and follow the prompts.  For any non-urgent questions, you may also contact your provider using MyChart. We now offer e-Visits for anyone 37 and older to request care online for non-urgent symptoms. For details visit [email or web address].   Also download the MyChart app! Go to the app store, search "MyChart", open the app, select [place], and log in with your MyChart username and password.

## 2022-10-13 LAB — CBC WITH DIFFERENTIAL/PLATELET
MCHC: 34.8 g/dL (ref 32.0–36.0)
MCV: 90.4 fL (ref 80.0–100.0)
MPV: 10.8 fL (ref 7.5–12.5)
Neutrophils Relative %: 81 %
Platelets: 97 10*3/uL — ABNORMAL LOW (ref 140–400)
WBC: 6.8 10*3/uL (ref 3.8–10.8)

## 2022-10-14 LAB — CBC WITH DIFFERENTIAL/PLATELET
Absolute Monocytes: 646 cells/uL (ref 200–950)
Basophils Absolute: 20 cells/uL (ref 0–200)
Basophils Relative: 0.3 %
Eosinophils Absolute: 204 cells/uL (ref 15–500)
Eosinophils Relative: 3 %
HCT: 35.6 % — ABNORMAL LOW (ref 38.5–50.0)
Hemoglobin: 12.4 g/dL — ABNORMAL LOW (ref 13.2–17.1)
Lymphs Abs: 422 cells/uL — ABNORMAL LOW (ref 850–3900)
MCH: 31.5 pg (ref 27.0–33.0)
Monocytes Relative: 9.5 %
Neutro Abs: 5508 cells/uL (ref 1500–7800)
RBC: 3.94 10*6/uL — ABNORMAL LOW (ref 4.20–5.80)
RDW: 17 % — ABNORMAL HIGH (ref 11.0–15.0)
Total Lymphocyte: 6.2 %

## 2022-10-14 LAB — COMPREHENSIVE METABOLIC PANEL
AG Ratio: 0.9 (calc) — ABNORMAL LOW (ref 1.0–2.5)
ALT: 21 U/L (ref 9–46)
AST: 41 U/L — ABNORMAL HIGH (ref 10–40)
Albumin: 3.3 g/dL — ABNORMAL LOW (ref 3.6–5.1)
Alkaline phosphatase (APISO): 124 U/L (ref 36–130)
BUN: 13 mg/dL (ref 7–25)
CO2: 25 mmol/L (ref 20–32)
Calcium: 9.7 mg/dL (ref 8.6–10.3)
Chloride: 97 mmol/L — ABNORMAL LOW (ref 98–110)
Creat: 1.11 mg/dL (ref 0.60–1.29)
Globulin: 3.6 g/dL (calc) (ref 1.9–3.7)
Glucose, Bld: 89 mg/dL (ref 65–99)
Potassium: 4.5 mmol/L (ref 3.5–5.3)
Sodium: 130 mmol/L — ABNORMAL LOW (ref 135–146)
Total Bilirubin: 1.9 mg/dL — ABNORMAL HIGH (ref 0.2–1.2)
Total Protein: 6.9 g/dL (ref 6.1–8.1)

## 2022-10-14 LAB — PROTIME-INR
INR: 1.1
Prothrombin Time: 12 s — ABNORMAL HIGH (ref 9.0–11.5)

## 2022-10-17 ENCOUNTER — Ambulatory Visit: Payer: Medicaid Other | Admitting: Nutrition

## 2022-10-19 ENCOUNTER — Telehealth: Payer: Self-pay | Admitting: *Deleted

## 2022-10-19 NOTE — Telephone Encounter (Signed)
Pt was due to have a MRI on 10/05/22. He said he was told that since he had just had Feraheme infusion that he needed to wait 6 months before having the MRI.  FYI

## 2022-10-22 NOTE — Telephone Encounter (Signed)
He should be able to have MRI 3 months after receiving Feraheme. If he need additional iron infusions, he should ask if Venofer is an option for him at least temporarily as this should not affect MRI.   Please nic for MRI abdomen liver protocol in 3 months.   I will cc his hematologist Rojelio Brenner, PA-C on this as well.

## 2022-10-23 NOTE — Telephone Encounter (Signed)
Pt informed of providers message and recommendations. Verabalized understanding. He has an upcoming appointment.

## 2022-10-25 ENCOUNTER — Other Ambulatory Visit: Payer: Self-pay | Admitting: Interventional Radiology

## 2022-10-25 DIAGNOSIS — K769 Liver disease, unspecified: Secondary | ICD-10-CM

## 2022-10-25 DIAGNOSIS — R188 Other ascites: Secondary | ICD-10-CM

## 2022-10-25 NOTE — Progress Notes (Signed)
Referring Provider: Reather Converse, PA-C Primary Care Physician:  Reather Converse, PA-C Primary GI Physician: Dr. Jena Gauss  Chief Complaint  Patient presents with   Follow-up    Fatigue    HPI:   Alejandro Porter is a 47 y.o. male with a history of cirrhosis complicated by portal hypertension, esophageal varices, portal vein thrombosis previously on Eliquis (discontinued 09/2022), hepatic encephalopathy on lactulose and Xfiaxan, ascites requiring repeat paras, history of SBP in April 2022 , liver lesions noted on MRI November 2023, IDA followed by Hematology, biliary dyskinesia but not a surgical candidate. He was undergoing liver transplant eval at [facility], but this was closed as it was felt he was not a good candidate secondary to the lack of support.  Since January, he has had worsening ascites requiring increasingly frequent paracentesis anywhere from every 2 weeks to twice a week.  We have been unable to titrate diuretics effectively due to worsening renal function, hyponatremia. 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  He is presenting today for follow-up.   He was last seen in our office 10/03/2022.  Reported he was not feeling very well overall, fatigued, short of breath.  Not eating much due to nausea and postprandial abdominal pain which was chronic and felt likely to be secondary to biliary dyskinesia.  Reported he was taking Zofran once every morning, but this was not helping much.   Denied reflux symptoms.  No HE.  Overall, ascites and LE edema much improved he was down about 43 pounds.  Still taking Lasix 40 mg twice daily and spironolactone 100 mg daily.  Due to significant fatigue, shortness of breath, patient advised to proceed to the emergency room.  Otherwise, recommended continuing current medications, increase Zofran to 3 times daily, MRI in the near future  Ultimately, patient did not go to the emergency room.  I spoke with him on 4/9  patient reported he is feeling much better.  Recommended 6-week follow-up.  MRI was scheduled for 5/2, but patient called to report that he had received Feraheme.  Advised that we will need to wait for 3 months after Feraheme infusion before MRI.  Recommended Venofer moving forward.  Recommendations were relayed to hematology.   Today: Pain in his abdomen every time he eats. Having pain all over. Doesn't have an appetite. Some nausea postprandially. Also having pain in the right side of his chest. Started 2 weeks ago. Usually when he is up doing something. Intermittent SOB. No chest pain currently.   He has follow-up with IR 10/31/2022. Hasn't felt right since having this done. Has a lot of fatigue.   Overwhelmed with his dad. Taking care of him. Its too much to deal with his own diease. Goes [facility]th to a therapist. Hasn't discussed with PCP.    No reflux, dysphagia, brbpr, or melena.    Cirrhosis: MELD 3.0: 16 based on labs completed 10/13/22.  Imaging: Last abdominal imaging 08/07/2022 with CT angio pelvis with LI-RADS 3 lesion in the right upper lobe of the liver Hep A/B vaccination: Complete. EGD: Oct 2023; small esophageal varices, portal gastropathy, multiple gastric polyps s/p resection and retrieval (hyperplastic) duodenitis. 1 year surveillance due.   Ascites/peripheral edema: Legs are swelling. Stomach isn't swelling. Eating some foods out of a can, soup intermittently.  Diuretics: Spironolactone 100 mg daily, Lasix 40 mg BID.  Encephalopathy: Reports intermittent forgetfulness. Taking Lactulose 60 ml 3 times a day. Having 4 Bms every morning. Also taking xifaxan BID.  Colonoscopy Oct 2023: fair colon prep, non-bleeding internal hemorrhoids and rectal varices. Stool in colon. 1 year surveillance due to poor prep.    Past Medical History:  Diagnosis Date   Anxiety 08/2022   CKD (chronic kidney disease) stage 2, GFR 60-89 ml/min 05/24/2022   stage 3a   Depression 06/2022   GERD  (gastroesophageal reflux disease)    H/O colonoscopy    H/O endoscopy    Headache    Hypertension    Liver cirrhosis (HCC)    Neuropathy    OSA (obstructive sleep apnea)    not currently using CPAP   S/P abdominal paracentesis     Past Surgical History:  Procedure Laterality Date   BIOPSY  09/12/2019   Procedure: BIOPSY;  Surgeon: Corbin Ade, MD;  [facility]: [facility];  Service: Endoscopy;;   COLONOSCOPY N/A 09/18/2019   Dr. Darrick Penna: 12 colon polyps removed, multiple simple adenomas and some inflammatory polyps, diverticulosis, external and internal hemorrhoids.  Next colonoscopy in 3 years.   COLONOSCOPY WITH PROPOFOL N/A 03/24/2022   Procedure: COLONOSCOPY WITH PROPOFOL;  Surgeon: Lanelle Bal, DO;  [facility]: [facility];  Service: Endoscopy;  Laterality: N/A;  1:30pm, asa 3   ESOPHAGEAL BANDING N/A 09/12/2019   Procedure: ESOPHAGEAL BANDING;  Surgeon: Corbin Ade, MD;  [facility];  Service: Endoscopy;  Laterality: N/A;   ESOPHAGOGASTRODUODENOSCOPY N/A 09/12/2019   Dr. Jena Gauss: Mild erosive reflux esophagitis.  Schatzki ring status post dilation.  Small grade 1/grade 2 esophageal varices, portal hypertensive gastropathy, hyperplastic gastric polyp, gastric erosions with chronic inactive gastritis on biopsies, no H. pylori.   ESOPHAGOGASTRODUODENOSCOPY (EGD) WITH PROPOFOL N/A 12/16/2020   two short columns of no more than Grade 2 esophageal varices, appearing innocent. Grade 1 varices not apparent today. Multiple large posterior body and antral hyperplastic, hemorrhagic appearing polyps, larges approximately 2.5 cm pedunculated. Portal gastropathy. Normal duodenum. Polypectomy with clips placed. EGD in 18 months.   ESOPHAGOGASTRODUODENOSCOPY (EGD) WITH PROPOFOL N/A 03/24/2022   Procedure: ESOPHAGOGASTRODUODENOSCOPY (EGD) WITH PROPOFOL;  Surgeon: Lanelle Bal, DO;  [facility];  Service: Endoscopy;  Laterality: N/A;   IR ANGIOGRAM SELECTIVE  EACH ADDITIONAL VESSEL  09/04/2022   IR EMBO VENOUS NOT HEMORR HEMANG  INC GUIDE ROADMAPPING  09/04/2022   IR INTRAVASCULAR ULTRASOUND NON CORONARY  09/04/2022   IR PARACENTESIS  09/04/2022   IR RADIOLOGIST EVAL & MGMT  07/19/2022   IR RADIOLOGIST EVAL & MGMT  08/14/2022   IR RADIOLOGIST EVAL & MGMT  09/29/2022   IR TIPS  09/04/2022   IR US GUIDE VASC ACCESS RIGHT  09/04/2022   POLYPECTOMY  09/18/2019   Procedure: POLYPECTOMY;  Surgeon: West Bali, MD;  [facility]: [facility];  Service: Endoscopy;;   POLYPECTOMY  12/16/2020   Procedure: POLYPECTOMY;  Surgeon: Corbin Ade, MD;  [facility];  Service: Endoscopy;;  gastric   POLYPECTOMY  03/24/2022   Procedure: POLYPECTOMY;  Surgeon: Lanelle Bal, DO;  [facility];  Service: Endoscopy;;   RADIOLOGY WITH ANESTHESIA N/A 09/04/2022   Procedure: TIPS;  Surgeon: Bennie Dallas, MD;  Location: [facility];  Service: Radiology;  Laterality: N/A;    Current Outpatient Medications  Medication Sig Dispense Refill   Cholecalciferol (VITAMIN D3) 125 MCG (5000 UT) TABS Take 5,000 Units by mouth in the morning.     ciprofloxacin (CIPRO) 500 MG tablet TAKE 1 TABLET BY MOUTH DAILY 30 tablet 5   cyclobenzaprine (FLEXERIL) 10  MG tablet Take 10 mg by mouth 3 (three) times daily as needed for muscle spasms.     DULoxetine (CYMBALTA) 60 MG capsule Take 120 mg by mouth daily.     FEROSUL 325 (65 Fe) MG tablet Take 325 mg by mouth 2 (two) times daily.     ferrous sulfate 325 (65 FE) MG EC tablet Take 325 mg by mouth in the morning and at bedtime.     furosemide (LASIX) 40 MG tablet Take 40 mg by mouth 2 (two) times daily.     gabapentin (NEURONTIN) 300 MG capsule Take 300 mg by mouth 3 (three) times daily as needed (pain.).     lactulose (CHRONULAC) 10 GM/15ML solution Take 60 mLs (40 g total) by mouth 3 (three) times daily. Titrate to goal of 3-4 BMs daily. 946 mL 11   levOCARNitine (CARNITOR) 330 MG tablet Take 330 mg by mouth 3 (three) times  daily.     Multiple Vitamin (MULTIVITAMIN) tablet Take 1 tablet by mouth in the morning.     ondansetron (ZOFRAN) 4 MG tablet Take 1 tablet (4 mg total) by mouth 3 (three) times daily before meals. 90 tablet 3   pantoprazole (PROTONIX) 40 MG tablet TAKE 1 TABLET BY MOUTH DAILY 30 MINUTES BEFORE BREAKFAST, take instead of omeprazole 90 tablet 3   spironolactone (ALDACTONE) 100 MG tablet Take 100 mg by mouth daily.     XIFAXAN 550 MG TABS tablet TAKE 1 TABLET BY MOUTH TWICE DAILY 180 tablet 3   Zinc 50 MG TABS Take 50 mg by mouth in the morning.     No current facility-administered medications for this visit.    Allergies as of 10/26/2022 - Review Complete 10/26/2022  Allergen Reaction Noted   Chlorthalidone  11/04/2019   Metoprolol  11/04/2019    Family History  Problem Relation Age of Onset   Brain cancer Mother    Diabetes Mother    Diabetes Father    Pulmonary embolism Brother    Liver disease Neg Hx     Social History   Socioeconomic History   Marital status: Single    Spouse name: Not on file   Number of children: Not on file   Years of education: Not on file   Highest education level: Not on file  Occupational History   Occupation: umemployed  Tobacco Use   Smoking status: Never    Passive exposure: Current   Smokeless tobacco: Never  Vaping Use   Vaping Use: Never used  Substance and Sexual Activity   Alcohol use: Not Currently   Drug use: Never   Sexual activity: Not Currently  Other Topics Concern   Not on file  Social History Narrative   Not on file   Social Determinants of Health   Financial Resource Strain: Not on file  Food Insecurity: Not on file  Transportation Needs: Not on file  Physical Activity: Not on file  Stress: Not on file  Social Connections: Not on file    Review of Systems: Gen: Denies fever, chills, cold flulike symptoms, presyncope, syncope. CV: See HPI Resp: See HPI GI: See HPI Heme: See HPI  Physical Exam: BP 106/70  (BP Location: Right Arm, Patient Position: Sitting, Cuff Size: Large)   Pulse 79   Temp 98 F (36.7 C) (Oral)   Ht 5\' 8"  (1.727 m)   Wt 239 lb 6.4 oz (108.6 kg)   SpO2 100%   BMI 36.40 kg/m  General:   Alert and oriented. No  distress noted. Pleasant and cooperative.  Head:  Normocephalic and atraumatic. Eyes:  Conjuctiva clear without scleral icterus. Heart:  S1, S2 present without murmurs appreciated. Lungs:  Clear to auscultation bilaterally. No wheezes, rales, or rhonchi. No distress.  Abdomen:  +BS, soft, nondistended.  Generalized tenderness to palpation, greatest in the right upper quadrant.  No rebound or guarding. No HSM or masses noted. Msk:  Symmetrical without gross deformities. Normal posture. Extremities:  With 1+ pitting edema in lower extremities bilaterally. Neurologic:  Alert and  oriented x4 Psych:  Normal mood and affect. Holding back tears when talking about his dad being sick and likely passing soon.     Assessment:  47 year old male with history of cirrhosis s/p TIPS due to refractory ascites, liver lesions, IDA followed by hematology, biliary dyskinesia, presenting today for follow-up.  NASH Cirrhosis: Complicated by portal hypertension, esophageal varices, portal vein thrombosis previously on Eliquis (discontinued 09/2022), HE, SBP April 2022 on cipro for prophylaxis, refractory ascites s/p TIPS 09/04/2022.  MELD 3.0 is 16 based on most complete labs 10/13/2022.  Hep A/B vaccination complete.  Last EGD October 2023 with small esophageal varices, portal gastropathy, due for surveillance October 2024.  Regarding his history of HE, he is doing fairly well at this time on lactulose 60 mL 3 times daily and Xifaxan 550 mg twice daily, but is reporting intermittent forgetfulness and some labile mood though this may be secondary to significant stress/trouble sleeping secondary to taking care of his father who also has cirrhosis and is now on hospice care. He also has 1+ pitting  edema in the lower extremities bilaterally no appreciable ascites.  He is compliant with his diuretics, spironolactone 100 mg daily and Lasix 40 mg twice daily, but has room for improvement in his daily sodium restriction. Will focus on dietary adjustments rather than adjusting diuretics already low at 130.  He was previously undergoing liver transplant evaluation at [facility], but this was closed as it was felt that a good candidate secondary to lack of support.  Liver lesion: LI-RADS 3 liver lesions measuring 1.9 cm in the superior peripheral right liver and 2.5 cm along the anterior intrahepatic IVC noted on MRI in November 2023.  He was due for follow-up MRI in February, but this has not been completed.  He was scheduled for MRI 10/05/22 but had Feraheme infusion 5/9, and thus should not have MRI for 3 months.  I have asked for hematology to give Venofer moving forward.  Generalized abdominal pain, nausea/vomiting: Chronic postprandial abdominal pain, nausea, vomiting, patient feels his symptoms are worsening.  He has known biliary dyskinesia which I suspect is likely the etiology of his symptoms.   On exam, he has generalized TTP on exam today, but greatest tenderness in the right upper quadrant. No real ascites appreciated on exam today, so doubt SBP.  Additionally, he is on Cipro for prophylaxis. He denies reflux symptoms on daily PPI.  EGD in October 2023 with portal hypertensive gastropathy, gastric polyps, duodenitis.  Normal gastric emptying last year. CT angio with patent mesenteric vasculature 08/2022.   I will plan to update labs and abdominal US. Obtain diagnostic paracentesis if there is enough fluid present to ensure no SBP. Consider trial of dicyclomine if imaging/labs unrevealing. May need to consider updating CT and/or EGD if persistent symptoms.    Chest pain/shortness of breath: Intermittent right-sided chest pain and shortness of breath.  Usually notices this when, " up doing  something".  Denies pain at rest, oe  reflux symptoms.  Etiology is unclear./2 for shortness of breath with minimal atelectasis versus scarring in the left lung base, no additional abnormalities.  He is under a significant amount of stress currently which could be contributing.  Recommended he call his PCP or cardiologist today to discuss chest pain and shortness of breath.   Anxiety/stress: Patient reports significant amounts of anxiety/stress secondary to dealing with his own disease as well as trying to help take care of his father who also has cirrhosis and is now on hospice care.  He has upcoming therapy appointment.  I have recommended that he reach out to his PCP today to see if he can get an appointment with them as well to discuss management options.  Fatigue: Reports fatigue/not feeling himself since undergoing TIPS 09/04/2022.  He has a follow-up with IR on 5/28 and plans to discuss at that time.  Reviewed recent labs 10/13/2022 which actually looked better than prior.  Hemoglobin 12.4, up from 9.7, 41-month ago.    Plan:  CBC, CMP, ammonia Abdominal ultrasound Liver Doppler Ultrasound paracentesis Continue Lasix 40 mg twice daily and spironolactone 100 mg daily. Strict 2 g sodium diet restriction. Continue lactulose 60 mL 3 times daily with a goal of at least 3-4 bowel movements daily.  Okay to increase lactulose if patient notices mild confusion/brain fog. Continue Xifaxan 550 mg twice daily. Continue ciprofloxacin 500 mg daily for SBP prophylaxis. Continue Zofran as needed for nausea/vomiting.  Nutrition:  High-protein diet from a primarily plant-based diet. Avoid red meat.  No raw or undercooked meat, seafood, or shellfish. Low-fat/cholesterol/carbohydrate diet. Recommend at least 30 minutes of aerobic and resistance exercise 3 days/week. MRI Liver Protocol and AFP August 2024.  Recommended patient call PCP to discuss anxiety/stress. Recommended patient call PCP and/or  cardiology to discuss chest pain and shortness of breath.  Recommended ER evaluation if any worsening symptoms. Patient plans to discuss fatigue with IR at upcoming follow-up.   [name], PA-C [facility] 10/26/2022

## 2022-10-26 ENCOUNTER — Encounter: Payer: Self-pay | Admitting: *Deleted

## 2022-10-26 ENCOUNTER — Ambulatory Visit (INDEPENDENT_AMBULATORY_CARE_PROVIDER_SITE_OTHER): Payer: Medicaid Other | Admitting: Gastroenterology

## 2022-10-26 ENCOUNTER — Encounter: Payer: Self-pay | Admitting: Gastroenterology

## 2022-10-26 VITALS — BP 106/70 | HR 79 | Temp 98.0°F | Ht 68.0 in | Wt 239.4 lb

## 2022-10-26 DIAGNOSIS — K7682 Hepatic encephalopathy: Secondary | ICD-10-CM

## 2022-10-26 DIAGNOSIS — R0602 Shortness of breath: Secondary | ICD-10-CM

## 2022-10-26 DIAGNOSIS — R188 Other ascites: Secondary | ICD-10-CM

## 2022-10-26 DIAGNOSIS — R079 Chest pain, unspecified: Secondary | ICD-10-CM

## 2022-10-26 DIAGNOSIS — K769 Liver disease, unspecified: Secondary | ICD-10-CM

## 2022-10-26 DIAGNOSIS — R5383 Other fatigue: Secondary | ICD-10-CM

## 2022-10-26 DIAGNOSIS — R1084 Generalized abdominal pain: Secondary | ICD-10-CM | POA: Diagnosis not present

## 2022-10-26 DIAGNOSIS — K7460 Unspecified cirrhosis of liver: Secondary | ICD-10-CM | POA: Diagnosis not present

## 2022-10-26 DIAGNOSIS — R110 Nausea: Secondary | ICD-10-CM

## 2022-10-26 NOTE — Patient Instructions (Addendum)
Please have blood work completed at [facility].   We will arrange to have an ultrasound of your abdomen and possible paracentesis depending on the amount of fluid that is in your abdomen at [facility].  Continue Lasix 40 mg twice daily and spironolactone 100 mg daily.  Follow a strict low-sodium diet.  No more than 2000 mg of sodium per 24 hours.  Continue lactulose 60 mL 3 times a day with a goal of at least 3-4 bowel movements daily.  You can increase lactulose if you start to notice mild confusion/brain fog.  Continue Xifaxan 550 mg twice daily.  Continue to use Zofran as needed for nausea or vomiting.  Continue ciprofloxacin 500 mg daily.  Nutrition:  High-protein diet from a primarily plant-based diet. Avoid red meat.  No raw or undercooked meat, seafood, or shellfish. Low-fat/cholesterol/carbohydrate diet. Limit sodium to no more than 2000 mg/day including everything that you eat and drink. Recommend at least 30 minutes of aerobic and resistance exercise 3 days/week.  Please call your primary care provider today to discuss your stress/anxiety levels related to your current health and your dad's health.  Call your primary care doctor or your cardiologist today to let them know about your chest pain and shortness of breath.  If you have any worsening symptoms, you need to proceed to the emergency room.  Follow-up date to be determined pending imaging and lab results.    [name], PA-C [facility]

## 2022-10-27 ENCOUNTER — Ambulatory Visit (HOSPITAL_COMMUNITY)
Admission: RE | Admit: 2022-10-27 | Discharge: 2022-10-27 | Disposition: A | Discharge status: Home / Self Care | Payer: Medicaid Other | Source: Ambulatory Visit | Attending: Gastroenterology | Admitting: Gastroenterology

## 2022-10-27 DIAGNOSIS — K7460 Unspecified cirrhosis of liver: Secondary | ICD-10-CM | POA: Insufficient documentation

## 2022-10-27 DIAGNOSIS — R188 Other ascites: Secondary | ICD-10-CM | POA: Insufficient documentation

## 2022-10-27 DIAGNOSIS — R1084 Generalized abdominal pain: Secondary | ICD-10-CM | POA: Diagnosis present

## 2022-10-27 LAB — COMPLETE METABOLIC PANEL WITH GFR
AG Ratio: 1.1 (calc) (ref 1.0–2.5)
ALT: 31 U/L (ref 9–46)
AST: 53 U/L — ABNORMAL HIGH (ref 10–40)
Albumin: 3.1 g/dL — ABNORMAL LOW (ref 3.6–5.1)
Alkaline phosphatase (APISO): 124 U/L (ref 36–130)
BUN: 15 mg/dL (ref 7–25)
CO2: 24 mmol/L (ref 20–32)
Calcium: 8.3 mg/dL — ABNORMAL LOW (ref 8.6–10.3)
Chloride: 100 mmol/L (ref 98–110)
Creat: 1.09 mg/dL (ref 0.60–1.29)
Globulin: 2.9 g/dL (calc) (ref 1.9–3.7)
Glucose, Bld: 128 mg/dL (ref 65–139)
Potassium: 4.3 mmol/L (ref 3.5–5.3)
Sodium: 131 mmol/L — ABNORMAL LOW (ref 135–146)
Total Bilirubin: 1.6 mg/dL — ABNORMAL HIGH (ref 0.2–1.2)
Total Protein: 6 g/dL — ABNORMAL LOW (ref 6.1–8.1)
eGFR: 84 mL/min/{1.73_m2} (ref >=60)

## 2022-10-27 LAB — CBC WITH DIFFERENTIAL/PLATELET
Absolute Monocytes: 428 cells/uL (ref 200–950)
Basophils Absolute: 19 cells/uL (ref 0–200)
Basophils Relative: 0.4 %
Eosinophils Absolute: 179 cells/uL (ref 15–500)
Eosinophils Relative: 3.8 %
HCT: 31.4 % — ABNORMAL LOW (ref 38.5–50.0)
Hemoglobin: 10.9 g/dL — ABNORMAL LOW (ref 13.2–17.1)
Lymphs Abs: 329 cells/uL — ABNORMAL LOW (ref 850–3900)
MCH: 32.9 pg (ref 27.0–33.0)
MCHC: 34.7 g/dL (ref 32.0–36.0)
MCV: 94.9 fL (ref 80.0–100.0)
MPV: 10.6 fL (ref 7.5–12.5)
Monocytes Relative: 9.1 %
Neutro Abs: 3746 cells/uL (ref 1500–7800)
Neutrophils Relative %: 79.7 %
Platelets: 88 10*3/uL — ABNORMAL LOW (ref 140–400)
RBC: 3.31 10*6/uL — ABNORMAL LOW (ref 4.20–5.80)
RDW: 16.4 % — ABNORMAL HIGH (ref 11.0–15.0)
Total Lymphocyte: 7 %
WBC: 4.7 10*3/uL (ref 3.8–10.8)

## 2022-10-27 LAB — AMMONIA: Ammonia: 66 umol/L (ref <=72)

## 2022-10-27 MED ORDER — ALBUMIN HUMAN 25 % IV SOLN: 50.0000 g | Freq: Once | INTRAVENOUS | Status: DC

## 2022-10-27 NOTE — Progress Notes (Signed)
Reason for follow up:  Patient presents via virtual telephone visit today for follow up after TIPS creation 09/04/22  Referring Physician(s): Earnest Bailey, DO, [name], NP, Annamarie Major, NP   History of present illness: Alejandro Porter is a 47 y.o. male with a medical history significant for HTN, chronic kidney disease stage 3a, OSA and metabolic dysfunction-associated steatohepatitis with portal hypertension, esophageal varices, portal hypertensive gastropathy and recurrent ascites. He also has a non-occlusive chronic main portal vein thrombus and a history of encephalopathy. He has been evaluated for possible liver transplant but was considered a poor candidate due to social factors. The patient was referred to Interventional Radiology to discuss portal hypertension treatment/management options and we first met 07/19/22 via tele-health visit. He is now status post TIPS creation, paracentesis, and paraumbilical vein coil embolization on 09/04/22.  He has not required paracentesis since the procedure. His post-TIPS follow up appointment was 09/29/22 and he had complaints of very low energy and appetite levels, nausea, and pain radiating across his chest which he was treating with gabapentin. He had been compliant with lactulose and rifaximin and was without any encephalopathic symptoms.   I reached out to [name], GI, for assistance coordinating repeat labs and a chest x-ray. Labs and imaging were negative for acute findings. He was evaluated by his GI team 10/03/22 where he complained of worsening symptoms. He was encouraged to go to the ED for further evaluation but he did not go. During a follow up visit he stated he was feeling better.   He met with [name], PA-C 10/26/22 and his symptoms had returned. He complained of pain every time he eats, pain all over and a poor appetite plus nausea postprandially and shortness of breath with activity. He endorsed ongoing chest pain. The patient shared  with [name] that he is overwhelmed with his dad's health situation and the care he must provide. He had plans to meet with a therapist. Repeat labs were obtained yesterday.   States he feels worse today.  Complains of shoulder, chest, and abdominal pain.  He complains of persistent lack of appetite.  Complains of post-prandial abdominal pain.  He endorses some mild encephalopathy on rare occasion.  He remains compliant with lactulose TID.   Past Medical History:  Diagnosis Date   Anxiety 08/2022   CKD (chronic kidney disease) stage 2, GFR 60-89 ml/min 05/24/2022   stage 3a   Depression 06/2022   GERD (gastroesophageal reflux disease)    H/O colonoscopy    H/O endoscopy    Headache    Hypertension    Liver cirrhosis (HCC)    Neuropathy    OSA (obstructive sleep apnea)    not currently using CPAP   S/P abdominal paracentesis     Past Surgical History:  Procedure Laterality Date   BIOPSY  09/12/2019   Procedure: BIOPSY;  Surgeon: Corbin Ade, MD;  Location: [facility];  Service: Endoscopy;;   COLONOSCOPY N/A 09/18/2019   Dr. Darrick Penna: 12 colon polyps removed, multiple simple adenomas and some inflammatory polyps, diverticulosis, external and internal hemorrhoids.  Next colonoscopy in 3 years.   COLONOSCOPY WITH PROPOFOL N/A 03/24/2022   Procedure: COLONOSCOPY WITH PROPOFOL;  Surgeon: Lanelle Bal, DO;  Location: [facility];  Service: Endoscopy;  Laterality: N/A;  1:30pm, asa 3   ESOPHAGEAL BANDING N/A 09/12/2019   Procedure: ESOPHAGEAL BANDING;  Surgeon: Corbin Ade, MD;  Location: [facility];  Service: Endoscopy;  Laterality: N/A;   ESOPHAGOGASTRODUODENOSCOPY  N/A 09/12/2019   Dr. Jena Gauss: Mild erosive reflux esophagitis.  Schatzki ring status post dilation.  Small grade 1/grade 2 esophageal varices, portal hypertensive gastropathy, hyperplastic gastric polyp, gastric erosions with chronic inactive gastritis on biopsies, no H. pylori.   ESOPHAGOGASTRODUODENOSCOPY  (EGD) WITH PROPOFOL N/A 12/16/2020   two short columns of no more than Grade 2 esophageal varices, appearing innocent. Grade 1 varices not apparent today. Multiple large posterior body and antral hyperplastic, hemorrhagic appearing polyps, larges approximately 2.5 cm pedunculated. Portal gastropathy. Normal duodenum. Polypectomy with clips placed. EGD in 18 months.   ESOPHAGOGASTRODUODENOSCOPY (EGD) WITH PROPOFOL N/A 03/24/2022   Procedure: ESOPHAGOGASTRODUODENOSCOPY (EGD) WITH PROPOFOL;  Surgeon: Lanelle Bal, DO;  Location: [facility];  Service: Endoscopy;  Laterality: N/A;   IR ANGIOGRAM SELECTIVE EACH ADDITIONAL VESSEL  09/04/2022   IR EMBO VENOUS NOT HEMORR HEMANG  INC GUIDE ROADMAPPING  09/04/2022   IR INTRAVASCULAR ULTRASOUND NON CORONARY  09/04/2022   IR PARACENTESIS  09/04/2022   IR RADIOLOGIST EVAL & MGMT  07/19/2022   IR RADIOLOGIST EVAL & MGMT  08/14/2022   IR RADIOLOGIST EVAL & MGMT  09/29/2022   IR TIPS  09/04/2022   IR US GUIDE VASC ACCESS RIGHT  09/04/2022   POLYPECTOMY  09/18/2019   Procedure: POLYPECTOMY;  Surgeon: West Bali, MD;  Location: [facility];  Service: Endoscopy;;   POLYPECTOMY  12/16/2020   Procedure: POLYPECTOMY;  Surgeon: Corbin Ade, MD;  Location: [facility];  Service: Endoscopy;;  gastric   POLYPECTOMY  03/24/2022   Procedure: POLYPECTOMY;  Surgeon: Lanelle Bal, DO;  Location: [facility];  Service: Endoscopy;;   RADIOLOGY WITH ANESTHESIA N/A 09/04/2022   Procedure: TIPS;  Surgeon: Bennie Dallas, MD;  Location: [facility];  Service: Radiology;  Laterality: N/A;    Allergies: Chlorthalidone and Metoprolol  Medications: Prior to Admission medications   Medication Sig Start Date End Date Taking? Authorizing Provider  Cholecalciferol (VITAMIN D3) 125 MCG (5000 UT) TABS Take 5,000 Units by mouth in the morning. 06/17/19   [provider]  ciprofloxacin (CIPRO) 500 MG tablet TAKE 1 TABLET BY MOUTH DAILY 07/12/22   Gelene Mink, NP   cyclobenzaprine (FLEXERIL) 10 MG tablet Take 10 mg by mouth 3 (three) times daily as needed for muscle spasms. 09/29/20   [provider]  DULoxetine (CYMBALTA) 60 MG capsule Take 120 mg by mouth daily. 05/03/22   [provider]  FEROSUL 325 (65 Fe) MG tablet Take 325 mg by mouth 2 (two) times daily. 09/26/22   [provider]  ferrous sulfate 325 (65 FE) MG EC tablet Take 325 mg by mouth in the morning and at bedtime.    [provider]  furosemide (LASIX) 40 MG tablet Take 40 mg by mouth 2 (two) times daily. 01/23/22   [provider]  gabapentin (NEURONTIN) 300 MG capsule Take 300 mg by mouth 3 (three) times daily as needed (pain.).    [provider]  lactulose (CHRONULAC) 10 GM/15ML solution Take 60 mLs (40 g total) by mouth 3 (three) times daily. Titrate to goal of 3-4 BMs daily. 09/18/22   Letta Median, PA-C  levOCARNitine (CARNITOR) 330 MG tablet Take 330 mg by mouth 3 (three) times daily. 09/01/20   [provider]  Multiple Vitamin (MULTIVITAMIN) tablet Take 1 tablet by mouth in the morning.    [provider]  ondansetron (ZOFRAN) 4 MG tablet Take 1 tablet (4 mg total) by mouth 3 (  three) times daily before meals. 09/06/22   Letta Median, PA-C  pantoprazole (PROTONIX) 40 MG tablet TAKE 1 TABLET BY MOUTH DAILY 30 MINUTES BEFORE BREAKFAST, take instead of omeprazole 09/11/22   Letta Median, PA-C  spironolactone (ALDACTONE) 100 MG tablet Take 100 mg by mouth daily.    [provider]  XIFAXAN 550 MG TABS tablet TAKE 1 TABLET BY MOUTH TWICE DAILY 09/11/22   Letta Median, PA-C  Zinc 50 MG TABS Take 50 mg by mouth in the morning.    [provider]     Family History  Problem Relation Age of Onset   Brain cancer Mother    Diabetes Mother    Diabetes Father    Pulmonary embolism Brother    Liver disease Neg Hx     Social History   Socioeconomic History   Marital status: Single     Spouse name: Not on file   Number of children: Not on file   Years of education: Not on file   Highest education level: Not on file  Occupational History   Occupation: umemployed  Tobacco Use   Smoking status: Never    Passive exposure: Current   Smokeless tobacco: Never  Vaping Use   Vaping Use: Never used  Substance and Sexual Activity   Alcohol use: Not Currently   Drug use: Never   Sexual activity: Not Currently  Other Topics Concern   Not on file  Social History Narrative   Not on file   Social Determinants of Health   Financial Resource Strain: Not on file  Food Insecurity: Not on file  Transportation Needs: Not on file  Physical Activity: Not on file  Stress: Not on file  Social Connections: Not on file     Vital Signs: There were no vitals taken for this visit.  No physical examination was performed in lieu of virtual telephone clinic visit.   Imaging: TIPS 09/04/22   Labs:  CBC: Recent Labs    09/04/22 0559 09/05/22 0739 10/13/22 0927 10/26/22 1448  WBC 4.2 6.5 6.8 4.7  HGB 10.8* 9.7* 12.4* 10.9*  HCT 31.5* 28.7* 35.6* 31.4*  PLT 69* 67* 97* 88*    COAGS: Recent Labs    08/03/22 1102 09/04/22 0559 09/05/22 0739 10/13/22 0927  INR 1.4* 1.2 1.3* 1.1    BMP: Recent Labs    05/15/22 0801 05/22/22 1414 08/03/22 1102 09/04/22 0559 09/05/22 0739 10/13/22 0927 10/26/22 1448  NA 126*   < > 125* 124* 124* 130* 131*  K 4.6   < > 4.9 4.3 4.5 4.5 4.3  CL 94*   < > 95* 92* 95* 97* 100  CO2 22   < > 22 19* 18* 25 24  GLUCOSE 116*   < > 85 109* 112* 89 128  BUN 27*   < > 23* 21* 17 13 15   CALCIUM 9.2   < > 8.9 8.8* 8.9 9.7 8.3*  CREATININE 1.64*   < > 1.49* 1.42* 1.22 1.11 1.09  GFRNONAA 52*  --  58* >60 >60  --   --    < > = values in this interval not displayed.    LIVER FUNCTION TESTS: Recent Labs    01/13/22 0803 03/15/22 1053 05/15/22 0801 08/03/22 1102 09/05/22 0739 10/13/22 0927 10/26/22 1448  BILITOT 1.1   < > 1.0 1.1  1.0 1.9* 1.6*  AST 45*   < > 51* 45* 133* 41* 53*  ALT 47*   < >  47* 34 88* 21 31  ALKPHOS 110  --  127* 120 113  --   --   PROT 7.5   < > 8.2* 7.1 5.9* 6.9 6.0*  ALBUMIN 3.6  --  3.8 3.6 3.0*  --   --    < > = values in this interval not displayed.    12/11-27/23 -->  08/03/22 --> 10/26/22 Creatinine: 1.46 --> 1.49 --> 1.09 Total Bilirubin: 1.0 --> 1.1 --> 1.6 INR: 1.1 --> 1.4 --> 1.1 Sodium: 126 --> 125 --> 131 Albumin: 3.8 --> 3.6 --> 3.0  Assessment and Plan: Chanceller Sinsel is a 47 y.o. 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  He is apparently a poor candidate for transplant due to social factors. He is now status post TIPS creation on 09/04/22.  He has not had a paracentesis since the TIPS.  His renal function has improved significantly.  Despite these great outcomes, he endorses persistent post-prandial abdominal pain, malaise, back, and shoulder pain.  He is taking gabapentin with minimal relief.  I'm not sure the etiology of these symptoms.  Recent abdominal ultrasound is reassuring.  I assume some could be related to the recanalized paraumbilical vein embolization performed at the time of TIPS.  He is slated for an MRI abdomen to monitor LIRADS 3 lesions, however this is delayed due to recent iron infusion.  I recommend we get a multiphase CTA abdomen/pelvis to assess for any significant changes post-TIPS/varix embolization which may be the cause of his pain.  We can also monitor his hepatomas with this scan.  Our office will coordinate this scan, then arrange 1 month follow up with Mr. Blauer.    Marliss Coots, MD Pager: [phone] Clinic: [phone]    I spent a total of 25 Minutes in face to face in clinical consultation, greater than 50% of which was counseling/coordinating care for portal  hypertension.

## 2022-10-29 ENCOUNTER — Encounter: Payer: Self-pay | Admitting: Gastroenterology

## 2022-10-31 ENCOUNTER — Other Ambulatory Visit: Payer: Self-pay | Admitting: Interventional Radiology

## 2022-10-31 ENCOUNTER — Ambulatory Visit
Admission: RE | Admit: 2022-10-31 | Discharge: 2022-10-31 | Disposition: A | Discharge status: Home / Self Care | Payer: Medicaid Other | Source: Ambulatory Visit | Attending: Interventional Radiology | Admitting: Interventional Radiology

## 2022-10-31 ENCOUNTER — Other Ambulatory Visit (HOSPITAL_COMMUNITY): Payer: Medicaid Other

## 2022-10-31 DIAGNOSIS — K7460 Unspecified cirrhosis of liver: Secondary | ICD-10-CM

## 2022-10-31 DIAGNOSIS — K769 Liver disease, unspecified: Secondary | ICD-10-CM

## 2022-10-31 HISTORY — PX: IR RADIOLOGIST EVAL & MGMT: IMG5224

## 2022-11-01 ENCOUNTER — Other Ambulatory Visit: Payer: Self-pay | Admitting: *Deleted

## 2022-11-01 ENCOUNTER — Ambulatory Visit (INDEPENDENT_AMBULATORY_CARE_PROVIDER_SITE_OTHER): Payer: Medicaid Other | Admitting: Clinical

## 2022-11-01 DIAGNOSIS — F411 Generalized anxiety disorder: Secondary | ICD-10-CM

## 2022-11-01 DIAGNOSIS — F0630 Mood disorder due to known physiological condition, unspecified: Secondary | ICD-10-CM

## 2022-11-01 DIAGNOSIS — D649 Anemia, unspecified: Secondary | ICD-10-CM

## 2022-11-01 NOTE — Progress Notes (Signed)
IN PERSON   I connected with Alejandro Porter on 11/01/22 at 11:00 AM EST in person and verified that I am speaking with the correct person using two identifiers.   Location: Patient: Office Provider: Office   I discussed the limitations of evaluation and management by telemedicine and the availability of in person appointments. The patient expressed understanding and agreed to proceed. ( IN PERSON_)     THERAPIST PROGRESS NOTE   Session Time: 3:00 PM- 3:30 PM   Participation Level: Active   Behavioral Response: CasualAlertAnxious   Type of Therapy: Individual Therapy   Treatment Goals addressed: Coping for anxiety and depression   Interventions: CBT, Motivational Interviewing, Strength-based and Supportive   Summary: Alejandro Porter is a 47 y.o. 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   The OPT therapist worked with the patient for his ongoing OPT treatment. The OPT therapist utilized Motivational Interviewing to assist in creating therapeutic repore. The patient in the session was engaged and work in collaboration giving feedback about his triggers and symptoms over the past few weeks. The patient spoke about this physical health condition with his stage 2 liver diease as well as the impact of trying to caregiver for his Father who is in ailing health. The patient spoke about discord in the family with his sibling who is also involved in caregiving for their Father. The OPT therapist utilized Cognitive Behavioral Therapy through cognitive restructuring as well as worked with the patient on coping strategies to assist in management of his diagnoses as well as his thought process and positive thinking. The OPT therapist worked with the patient on managing interactions in the home as the patient lives with his Father age 60 and they are at times in conflict as well as with his brother.  The OPT therapist worked with the patient on mood management and utilizing  coping strategies.     Suicidal/Homicidal: Nowithout intent/plan   Therapist Response: The OPT therapist worked with the patient for the patients scheduled session. The patient was engaged in his session and gave feedback in relation to triggers, symptoms, and behavior responses over the past few weeks.The patient reviewed his current schedule and routine throughout the week. The OPT therapist worked with the patient utilizing an in session Cognitive Behavioral Therapy exercise. The patient was responsive in the session and verbalized, "I have been stressed and not sleeping well I have been trying to caregive for my dad and his dying from health problems and my brother doesn't make it any better he acts like I dont do anything and I can't be arguing with him and losing my temper".. The OPT therapist worked with the patient on management of his reactive behavior and not letting things escalate in the home, focus on self care, and consistency with his health appointments..The OPT therapist will continue treatment work with the patient in his next scheduled session.    Plan: Return again in 3 weeks.   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 The patient was provided an opportunity to ask questions and all were answered. The patient agreed with the plan and demonstrated an understanding of the instructions.   The patient was advised to call  back or seek an in-person evaluation if the symptoms worsen or if the condition fails to improve as anticipated.   I provided 30 minutes of face-to-face time during this encounter.   Alejandro Burn, LCSW   11/01/2022

## 2022-11-06 ENCOUNTER — Telehealth: Payer: Self-pay

## 2022-11-06 LAB — CBC WITH DIFFERENTIAL/PLATELET
Eosinophils Absolute: 149 cells/uL (ref 15–500)
Eosinophils Relative: 2.7 %
Lymphs Abs: 352 cells/uL — ABNORMAL LOW (ref 850–3900)
MCH: 33.3 pg — ABNORMAL HIGH (ref 27.0–33.0)
Monocytes Relative: 10.1 %
Neutrophils Relative %: 80.6 %
WBC: 5.5 10*3/uL (ref 3.8–10.8)

## 2022-11-06 NOTE — Telephone Encounter (Signed)
Pt phoned to advise that he is in pain (level 7) in his stomach, chest and his sides since past 4 days. Pt states he had bleeding last Wednesday and Saturday. I asked him was this during a BM he stated no. He was in the grocery store Saturday and he felt a warm gush from the rectum and it was blood. Both of these days it was not during a BM. I asked the pt was it a lot and he said it was enough to really see it in the commode. He states his chest pain is really bad and I had advised the ER is the best for this situation but he wanted me to ask you regarding this. Please advise

## 2022-11-06 NOTE — Telephone Encounter (Signed)
Phoned and advised the pt of your instructions for the ED. Pt states he was told by his hematologists that it was not a big deal. I advised the pt once again of the instructions and he said he can't go because he is with his dad and he is not doing to well. He did go on to say that he is taking his Gabapentin. He also states sometimes he takes up to 6 a day depending upon his pain. Advised the pt all the more that why you need to be evaluated at the ED. Pt declined at this time

## 2022-11-06 NOTE — Telephone Encounter (Signed)
Yes, he has been reporting chest pain for several weeks now. I agree, he needs to proceed to the emergency room  today for further evaluation of his worsening symptoms, especially in the setting of chest pain. The rest of his symptoms can be evaluated at the same time.

## 2022-11-07 LAB — CBC WITH DIFFERENTIAL/PLATELET
Absolute Monocytes: 556 cells/uL (ref 200–950)
Basophils Absolute: 11 cells/uL (ref 0–200)
Basophils Relative: 0.2 %
HCT: 31.3 % — ABNORMAL LOW (ref 38.5–50.0)
Hemoglobin: 11.1 g/dL — ABNORMAL LOW (ref 13.2–17.1)
MCHC: 35.5 g/dL (ref 32.0–36.0)
MCV: 94 fL (ref 80.0–100.0)
MPV: 11 fL (ref 7.5–12.5)
Neutro Abs: 4433 cells/uL (ref 1500–7800)
Platelets: 80 10*3/uL — ABNORMAL LOW (ref 140–400)
RBC: 3.33 10*6/uL — ABNORMAL LOW (ref 4.20–5.80)
RDW: 15.6 % — ABNORMAL HIGH (ref 11.0–15.0)
Total Lymphocyte: 6.4 %

## 2022-11-07 LAB — B12 AND FOLATE PANEL
Folate: 11.5 ng/mL
Vitamin B-12: 761 pg/mL (ref 200–1100)

## 2022-11-07 LAB — IRON,TIBC AND FERRITIN PANEL
%SAT: 31 % (calc) (ref 20–48)
Ferritin: 130 ng/mL (ref 38–380)
Iron: 66 ug/dL (ref 50–180)
TIBC: 213 mcg/dL (calc) — ABNORMAL LOW (ref 250–425)

## 2022-11-07 NOTE — Telephone Encounter (Signed)
Fantastic Thank you 

## 2022-11-07 NOTE — Telephone Encounter (Signed)
Dr. Jerrye Noble office has already done PA for the CTA auth# [id number] DOS 5/30-7/28. I called central scheduling to see if patient can be moved up or worked in.   Pt scheduled for 6/7, arrival 245pm, npo 4 hrs prior. Called pt, LMOVM for pt to call back to give

## 2022-11-07 NOTE — Telephone Encounter (Signed)
Spoke with patient. Reports his abdominal pain and chest pain are about the same as when I saw him in the office. He states he can't go to the ER right now. He doesn't have a ride and no one is at home with his dad. He denies any recurrent rectal bleeding and hemoglobin yesterday was stable at 11.1. His CT that Dr. Elby Showers ordered hasn't been moved up yet. Due to significant symptoms, we will see if we can get a CT scheduled ASAP.  He was advised that he needs to proceed to the emergency room if he has recurrent GI bleeding or any worsening symptoms.  Mindy/Tammy:  Can we arrange CT A/P angio with and without contrast ASAP?  He is already scheduled for this on 6/21 (per Dr. Elby Showers), but is having significant symptoms and need to be completed much sooner.  Dx: Cirrhosis, history of TIPS, abdominal pain, nausea, vomiting.

## 2022-11-07 NOTE — Telephone Encounter (Signed)
Pt informed to appointment date, time and instructions.

## 2022-11-10 ENCOUNTER — Ambulatory Visit (HOSPITAL_COMMUNITY)
Admission: RE | Admit: 2022-11-10 | Discharge: 2022-11-10 | Disposition: A | Discharge status: Home / Self Care | Payer: Medicaid Other | Source: Ambulatory Visit | Attending: Interventional Radiology | Admitting: Interventional Radiology

## 2022-11-10 ENCOUNTER — Encounter (HOSPITAL_COMMUNITY): Payer: Self-pay | Admitting: Radiology

## 2022-11-10 DIAGNOSIS — K7581 Nonalcoholic steatohepatitis (NASH): Secondary | ICD-10-CM | POA: Insufficient documentation

## 2022-11-10 DIAGNOSIS — K7460 Unspecified cirrhosis of liver: Secondary | ICD-10-CM | POA: Insufficient documentation

## 2022-11-10 MED ORDER — IOHEXOL 350 MG/ML SOLN
100.0000 mL | Freq: Once | INTRAVENOUS | Status: AC | PRN
  Administered 2022-11-10: 100 mL via INTRAVENOUS

## 2022-11-17 NOTE — Progress Notes (Signed)
Reason for follow up:  Patient presents via virtual telephone visit today for follow up after TIPS creation 09/04/22   Referring Physician(s): Earnest Bailey, DO, [name], NP, [name], PA-C, Annamarie Major, NP   History of Present Illness: Alejandro Porter is a 47 y.o. male  with a medical history significant for HTN, chronic kidney disease stage 3a, OSA and metabolic dysfunction-associated steatohepatitis with portal hypertension, esophageal varices, portal hypertensive gastropathy and recurrent ascites. He also has a non-occlusive chronic main portal vein thrombus and a history of encephalopathy. He has been evaluated for possible liver transplant but was considered a poor candidate due to social factors. The patient was referred to Interventional Radiology to discuss portal hypertension treatment/management options and we first met 07/19/22 via tele-health visit. He is now status post TIPS creation, paracentesis, and paraumbilical vein coil embolization on 09/04/22.  He has not required paracentesis since the procedure. His post-TIPS follow up appointment was 09/29/22 and he had complaints of very low energy and appetite levels, nausea, and pain radiating across his chest which he was treating with gabapentin. He had been compliant with lactulose and rifaximin and was without any encephalopathic symptoms.   He followed up with me 10/31/22 and had numerous complaints including shoulder, chest and abdominal pain, lack of appetite and occasional mild encephalopathy.  I ordered a multiphase CTA abdomen/pelvis to assess for any significant changes post-TIPS/varix embolization that may be the source of his pain.  He presents today via tele-visit to discuss these results.  Now complains of low back pain.  Still having some abdominal pain.  Good appetite. Still taking lactulose, occasional mild confusion.   States that he has gained some weight, has some leg swelling.  Continues to take lasix and  spironolactone, no recent dose changes.  Has an upcoming appointment with GI.     Past Medical History:  Diagnosis Date   Anxiety 08/2022   CKD (chronic kidney disease) stage 2, GFR 60-89 ml/min 05/24/2022   stage 3a   Depression 06/2022   GERD (gastroesophageal reflux disease)    H/O colonoscopy    H/O endoscopy    Headache    Hypertension    Liver cirrhosis (HCC)    Neuropathy    OSA (obstructive sleep apnea)    not currently using CPAP   S/P abdominal paracentesis     Past Surgical History:  Procedure Laterality Date   BIOPSY  09/12/2019   Procedure: BIOPSY;  Surgeon: Corbin Ade, MD;  Location: [facility];  Service: Endoscopy;;   COLONOSCOPY N/A 09/18/2019   Dr. Darrick Penna: 12 colon polyps removed, multiple simple adenomas and some inflammatory polyps, diverticulosis, external and internal hemorrhoids.  Next colonoscopy in 3 years.   COLONOSCOPY WITH PROPOFOL N/A 03/24/2022   Procedure: COLONOSCOPY WITH PROPOFOL;  Surgeon: Lanelle Bal, DO;  Location: [facility];  Service: Endoscopy;  Laterality: N/A;  1:30pm, asa 3   ESOPHAGEAL BANDING N/A 09/12/2019   Procedure: ESOPHAGEAL BANDING;  Surgeon: Corbin Ade, MD;  Location: [facility];  Service: Endoscopy;  Laterality: N/A;   ESOPHAGOGASTRODUODENOSCOPY N/A 09/12/2019   Dr. Jena Gauss: Mild erosive reflux esophagitis.  Schatzki ring status post dilation.  Small grade 1/grade 2 esophageal varices, portal hypertensive gastropathy, hyperplastic gastric polyp, gastric erosions with chronic inactive gastritis on biopsies, no H. pylori.   ESOPHAGOGASTRODUODENOSCOPY (EGD) WITH PROPOFOL N/A 12/16/2020   two short columns of no more than Grade 2 esophageal varices, appearing innocent. Grade 1 varices not apparent today. Multiple  large posterior body and antral hyperplastic, hemorrhagic appearing polyps, larges approximately 2.5 cm pedunculated. Portal gastropathy. Normal duodenum. Polypectomy with clips placed. EGD in 18 months.    ESOPHAGOGASTRODUODENOSCOPY (EGD) WITH PROPOFOL N/A 03/24/2022   Procedure: ESOPHAGOGASTRODUODENOSCOPY (EGD) WITH PROPOFOL;  Surgeon: Lanelle Bal, DO;  Location: [facility];  Service: Endoscopy;  Laterality: N/A;   IR ANGIOGRAM SELECTIVE EACH ADDITIONAL VESSEL  09/04/2022   IR EMBO VENOUS NOT HEMORR HEMANG  INC GUIDE ROADMAPPING  09/04/2022   IR INTRAVASCULAR ULTRASOUND NON CORONARY  09/04/2022   IR PARACENTESIS  09/04/2022   IR RADIOLOGIST EVAL & MGMT  07/19/2022   IR RADIOLOGIST EVAL & MGMT  08/14/2022   IR RADIOLOGIST EVAL & MGMT  09/29/2022   IR RADIOLOGIST EVAL & MGMT  10/31/2022   IR TIPS  09/04/2022   IR US GUIDE VASC ACCESS RIGHT  09/04/2022   POLYPECTOMY  09/18/2019   Procedure: POLYPECTOMY;  Surgeon: West Bali, MD;  Location: [facility];  Service: Endoscopy;;   POLYPECTOMY  12/16/2020   Procedure: POLYPECTOMY;  Surgeon: Corbin Ade, MD;  Location: [facility];  Service: Endoscopy;;  gastric   POLYPECTOMY  03/24/2022   Procedure: POLYPECTOMY;  Surgeon: Lanelle Bal, DO;  Location: [facility];  Service: Endoscopy;;   RADIOLOGY WITH ANESTHESIA N/A 09/04/2022   Procedure: TIPS;  Surgeon: Bennie Dallas, MD;  Location: [facility];  Service: Radiology;  Laterality: N/A;    Allergies: Chlorthalidone and Metoprolol  Medications: Prior to Admission medications   Medication Sig Start Date End Date Taking? Authorizing Provider  Cholecalciferol (VITAMIN D3) 125 MCG (5000 UT) TABS Take 5,000 Units by mouth in the morning. 06/17/19   [provider]  ciprofloxacin (CIPRO) 500 MG tablet TAKE 1 TABLET BY MOUTH DAILY 07/12/22   Gelene Mink, NP  cyclobenzaprine (FLEXERIL) 10 MG tablet Take 10 mg by mouth 3 (three) times daily as needed for muscle spasms. 09/29/20   [provider]  DULoxetine (CYMBALTA) 60 MG capsule Take 120 mg by mouth daily. 05/03/22   [provider]  FEROSUL 325 (65 Fe) MG tablet Take 325 mg by mouth 2 (two) times daily. 09/26/22    [provider]  ferrous sulfate 325 (65 FE) MG EC tablet Take 325 mg by mouth in the morning and at bedtime.    [provider]  furosemide (LASIX) 40 MG tablet Take 40 mg by mouth 2 (two) times daily. 01/23/22   [provider]  gabapentin (NEURONTIN) 300 MG capsule Take 300 mg by mouth 3 (three) times daily as needed (pain.).    [provider]  lactulose (CHRONULAC) 10 GM/15ML solution Take 60 mLs (40 g total) by mouth 3 (three) times daily. Titrate to goal of 3-4 BMs daily. 09/18/22   Letta Median, PA-C  levOCARNitine (CARNITOR) 330 MG tablet Take 330 mg by mouth 3 (three) times daily. 09/01/20   [provider]  Multiple Vitamin (MULTIVITAMIN) tablet Take 1 tablet by mouth in the morning.    [provider]  ondansetron (ZOFRAN) 4 MG tablet Take 1 tablet (4 mg total) by mouth 3 (three) times daily before meals. 09/06/22   Letta Median, PA-C  pantoprazole (PROTONIX) 40 MG tablet TAKE 1 TABLET BY MOUTH DAILY 30 MINUTES BEFORE BREAKFAST, take instead of omeprazole 09/11/22   Letta Median, PA-C  spironolactone (ALDACTONE) 100 MG tablet Take 100 mg by mouth daily.    [provider]  XIFAXAN 550 MG  TABS tablet TAKE 1 TABLET BY MOUTH TWICE DAILY 09/11/22   Letta Median, PA-C  Zinc 50 MG TABS Take 50 mg by mouth in the morning.    [provider]     Family History  Problem Relation Age of Onset   Brain cancer Mother    Diabetes Mother    Diabetes Father    Pulmonary embolism Brother    Liver disease Neg Hx     Social History   Socioeconomic History   Marital status: Single    Spouse name: Not on file   Number of children: Not on file   Years of education: Not on file   Highest education level: Not on file  Occupational History   Occupation: umemployed  Tobacco Use   Smoking status: Never    Passive exposure: Current   Smokeless tobacco: Never  Vaping Use   Vaping Use: Never used  Substance and  Sexual Activity   Alcohol use: Not Currently   Drug use: Never   Sexual activity: Not Currently  Other Topics Concern   Not on file  Social History Narrative   Not on file   Social Determinants of Health   Financial Resource Strain: Not on file  Food Insecurity: Not on file  Transportation Needs: Not on file  Physical Activity: Not on file  Stress: Not on file  Social Connections: Not on file    Vital Signs: There were no vitals taken for this visit.  No physical examination was performed in lieu of virtual telephone clinic visit.   Imaging: TIPS 09/04/22   CTA [facility] 11/10/22 Patent portal system, TIPS.  No new varices.  Smaller umbilical hernia.  Small volume perihepatic ascites.  Labs: 12/11-27/23 -->  08/03/22 --> 10/26/22 Creatinine: 1.46 --> 1.49 --> 1.09 Total Bilirubin: 1.0 --> 1.1 --> 1.6 INR: 1.1 --> 1.4 --> 1.1 Sodium: 126 --> 125 --> 131 Albumin: 3.8 --> 3.6 --> 3.0  TUMOR MARKERS: Recent Labs    03/15/22 1053  AFPTM 2.2    Assessment and Plan: Alejandro Porter is a 47 y.o. 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  He is apparently a poor candidate for transplant due to social factors. He is now status post TIPS creation on 09/04/22.  He still has not had a paracentesis since the TIPS.  His renal function has improved significantly.  CTA on 11/10/22 demonstrates a patent TIPS, small perihepatic ascites.  Despite these great outcomes, he continues endorses persistent post-prandial abdominal pain, malaise, back, and shoulder pain, now with lower extremity swelling and weight gain.  He is taking gabapentin with minimal relief.  I'm not sure the etiology of these symptoms, though he may now be third spacing into his lower extremities.  He is scheduling an appointment to be seen in the office  with GI soon.  -Agree with forthcoming Gastroenterology follow up.  Would recommend MELD labs at that time.  Consider further increase in diuretics given resolution of hepatorenal syndrome s/p TIPS. -Follow up in IR clinic in 2 months.  Marliss Coots, MD Pager: [phone] Clinic: [phone]    I spent a total of    25 Minutes in virtual clinical consultation, greater than 50% of which was counseling/coordinating care for portal hypertension

## 2022-11-20 ENCOUNTER — Telehealth: Payer: Self-pay | Admitting: *Deleted

## 2022-11-20 ENCOUNTER — Ambulatory Visit
Admission: RE | Admit: 2022-11-20 | Discharge: 2022-11-20 | Disposition: A | Discharge status: Home / Self Care | Payer: Medicaid Other | Source: Ambulatory Visit | Attending: Interventional Radiology | Admitting: Interventional Radiology

## 2022-11-20 ENCOUNTER — Encounter: Payer: Self-pay | Admitting: *Deleted

## 2022-11-20 DIAGNOSIS — K7460 Unspecified cirrhosis of liver: Secondary | ICD-10-CM

## 2022-11-20 HISTORY — PX: IR RADIOLOGIST EVAL & MGMT: IMG5224

## 2022-11-20 NOTE — Telephone Encounter (Signed)
Pt called and states he is having sever back pains on both side of his lower back. He states he has gained around 18lbs in a 3 weeks. Please advise.

## 2022-11-20 NOTE — Telephone Encounter (Signed)
I am not sure why he is having lower back pain. I reviewed his recent CT completed 11/10/22. No acute abnormalities to explain the abdominal pain he had been having. He did have small kidney stone on the left, but this wasn't causing any obvious issues at the time of the CT. No concerning findings with TIPS.   Weight gain is concerning. Is he taking his lasix and spironolactone and following a low sodium diet?    He needs to be seen in the office ASAP for evaluation. He can also call his PCP to discuss his lower back pain. If symptoms are severe, he can go to the ER.

## 2022-11-21 NOTE — Telephone Encounter (Signed)
Please contact patient by phone to discuss the prior note. I see a my chart message was sent and read, but no response was received. It would be best if we spoke with him over the phone.

## 2022-11-21 NOTE — Telephone Encounter (Signed)
I spoke to pt, informed him of recommendations. Pt voiced understanding.

## 2022-11-22 NOTE — Progress Notes (Signed)
Referring Provider: Reather Converse, PA-C Primary Care Physician:  Reather Converse, PA-C Primary GI Physician: Dr. Jena Gauss  Chief Complaint  Patient presents with   Weight Gain    Gaining weight and tired all the time. Having a hard time eating, feels nauseated     HPI:   Alejandro Porter is a 47 y.o. male presenting today for follow-up of cirrhosis and abdominal pain.  Also reporting weight gain, fatigue, SOB, and chest pain.   He has hstory of cirrhosis complicated by portal hypertension, esophageal varices, portal vein thrombosis previously on Eliquis (discontinued 09/2022), hepatic encephalopathy on lactulose and Xfiaxan, ascites requiring repeat paras, history of SBP in April 2022 , liver lesions noted on MRI November 2023, months currently overdue for surveillance MRI but on hold until August due to receiving Feraheme, IDA followed by Hematology, biliary dyskinesia but not a surgical candidate. He was undergoing liver transplant eval at [facility], but this was closed as it was felt he was not a good candidate secondary to the lack of support.  Since January, he has had worsening ascites requiring increasingly frequent paracentesis anywhere from every 2 weeks to twice a week.  We have been unable to titrate diuretics effectively due to worsening renal function, hyponatremia. 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    Last seen in the office 10/26/2022.  His primary concern was postprandial abdominal pain, lack of appetite, associated intermittent nausea.  On exam, he had mild generalized tenderness to palpation.  Also fatigue, shortness of breath, intermittent right-sided chest pain since undergoing TIPS.  Quite overwhelmed with his dad who is also in end stages of cirrhosis, currently on hospice.  Noted that his legs were swelling a bit (1+ pitting edema on exam), but his stomach was not swelling.  He was eating some foods that have a can, soup  intermittently.  He was taking spironolactone 100 mg daily and Lasix 40 mg twice daily.  No encephalopathy.  Recommended updating labs, abdominal ultrasound, liver Doppler, diagnostic paracentesis, hold off on adjusting diuretics and focus on dietary adjustments, new lactulose, Xifaxan, Cipro, Zofran as needed.  Recommended patient call PCP to discuss anxiety/stress and recommended he follow-up with PCP and/or cardiology on chest pain/shortness of breath, ER evaluation if worsening symptoms.   Labs completed 5/23 and remarkable for hemoglobin low at 10.9, platelets 88, AST 53, total bilirubin 1.6, sodium 131, creatinine 1.09.  Repeat labs 6/3 with hemoglobin stable at 11.1, no iron, B12, or folate deficiency.  Abdominal ultrasound/Doppler with no acute abnormalities, patent TIPS, small amount of ascites, but not enough to perform diagnostic paracentesis.   He had follow-up with IR 5/28.  Dr. Elby Showers ordered CT angio A/P with and without contrast that was completed 11/10/2022 showing widely patent right hepatic to right portal venous TIPS without complication, successful coil embolization of recannulized periumbilical vein.  A small portion of the [place] extends into the main portal vein.  No evidence of thrombus.  He did have evidence of ascites.  Follow-up with IR on 6/17 reported ongoing abdominal pain, malaise, back and shoulder pain as well as new onset lower extremity edema and weight gain.  Recommended follow-up with GI, rechecking MELD labs at that time, and consider increasing diuretics, follow-up in IR clinic in 2 months.  Today: Tired all the time. Fatigued. Has to stop to catch his breath when walking to the mail box which is about the length of a couple exam rooms. All this started after TIPS. Intermittent  chest pain described as little knives radiating from right to left. Occurs more so when laying down at night. Some pain when taking a deep breath.   No heartburn symptoms on pantoprazole 40  mg BID.   Can't eat much due to postprandial abdominal pain and nausea without vomiting. Pain is generalized, but worse in RUQ. Taking Zofran before a meal twice a day.  Back pain. Most of his back. Worse when picking something up. Also worse when walking. Thinks he may have hurt it when doing laundry.   Having LE edema. Worse in the LLE, states it can be 4 times the size of his right. Feels like his abdomen is getting swollen as well. Spironolactone 100 mg daily, Lasix 40 mg BID. Avoid deli meats and items out of a can usually, but does eat soup here and there, unclear how often.   Notes that sometimes its like he fades out when carrying a conversation and doesn't know what has been said. Also forgetful, but this is chronic.   Taking Lactulose 60 ml 3 times a day, Xifaxan BID. Having 3 Bms daily.  Takes iron daily. Having some intermittent black stool. Every other day for the last 2.5 months. No recent brbpr.  He had 1 episode of the very end of May and June 1.  MELD 3.0 16 on 10/13/22 Imaging: Most recent imaging includes CT angio A/P with and without contrast 11/10/2022 with no arterially enhancing lesions to suggest underlying HCC.  Hep A/B vaccination: Complete.  EGD: Oct 2023; small esophageal varices, portal gastropathy, multiple gastric polyps s/p resection and retrieval (hyperplastic) duodenitis. 1 year surveillance due.     No NSAIDs.   Colonoscopy Oct 2023: fair colon prep, non-bleeding internal hemorrhoids and rectal varices. Stool in colon. 1 year surveillance due to poor prep.      Past Medical History:  Diagnosis Date   Anxiety 08/2022   CKD (chronic kidney disease) stage 2, GFR 60-89 ml/min 05/24/2022   stage 3a   Depression 06/2022   GERD (gastroesophageal reflux disease)    H/O colonoscopy    H/O endoscopy    Headache    Hypertension    Liver cirrhosis (HCC)    Neuropathy    OSA (obstructive sleep apnea)    not currently using CPAP   S/P abdominal paracentesis      Past Surgical History:  Procedure Laterality Date   BIOPSY  09/12/2019   Procedure: BIOPSY;  Surgeon: Corbin Ade, MD;  [facility];  Service: Endoscopy;;   COLONOSCOPY N/A 09/18/2019   Dr. Darrick Penna: 12 colon polyps removed, multiple simple adenomas and some inflammatory polyps, diverticulosis, external and internal hemorrhoids.  Next colonoscopy in 3 years.   COLONOSCOPY WITH PROPOFOL N/A 03/24/2022   Procedure: COLONOSCOPY WITH PROPOFOL;  Surgeon: Lanelle Bal, DO;  [facility];  Service: Endoscopy;  Laterality: N/A;  1:30pm, asa 3   ESOPHAGEAL BANDING N/A 09/12/2019   Procedure: ESOPHAGEAL BANDING;  Surgeon: Corbin Ade, MD;  [facility];  Service: Endoscopy;  Laterality: N/A;   ESOPHAGOGASTRODUODENOSCOPY N/A 09/12/2019   Dr. Jena Gauss: Mild erosive reflux esophagitis.  Schatzki ring status post dilation.  Small grade 1/grade 2 esophageal varices, portal hypertensive gastropathy, hyperplastic gastric polyp, gastric erosions with chronic inactive gastritis on biopsies, no H. pylori.   ESOPHAGOGASTRODUODENOSCOPY (EGD) WITH PROPOFOL N/A 12/16/2020   two short columns of no more than Grade 2 esophageal varices, appearing innocent. Grade 1 varices not apparent today. Multiple large  posterior body and antral hyperplastic, hemorrhagic appearing polyps, larges approximately 2.5 cm pedunculated. Portal gastropathy. Normal duodenum. Polypectomy with clips placed. EGD in 18 months.   ESOPHAGOGASTRODUODENOSCOPY (EGD) WITH PROPOFOL N/A 03/24/2022   Procedure: ESOPHAGOGASTRODUODENOSCOPY (EGD) WITH PROPOFOL;  Surgeon: Lanelle Bal, DO;  [facility];  Service: Endoscopy;  Laterality: N/A;   IR ANGIOGRAM SELECTIVE EACH ADDITIONAL VESSEL  09/04/2022   IR EMBO VENOUS NOT HEMORR HEMANG  INC GUIDE ROADMAPPING  09/04/2022   IR INTRAVASCULAR ULTRASOUND NON CORONARY  09/04/2022   IR PARACENTESIS  09/04/2022   IR RADIOLOGIST EVAL & MGMT  07/19/2022   IR  RADIOLOGIST EVAL & MGMT  08/14/2022   IR RADIOLOGIST EVAL & MGMT  09/29/2022   IR RADIOLOGIST EVAL & MGMT  10/31/2022   IR RADIOLOGIST EVAL & MGMT  11/20/2022   IR TIPS  09/04/2022   IR US GUIDE VASC ACCESS RIGHT  09/04/2022   POLYPECTOMY  09/18/2019   Procedure: POLYPECTOMY;  Surgeon: West Bali, MD;  [facility];  Service: Endoscopy;;   POLYPECTOMY  12/16/2020   Procedure: POLYPECTOMY;  Surgeon: Corbin Ade, MD;  [facility];  Service: Endoscopy;;  gastric   POLYPECTOMY  03/24/2022   Procedure: POLYPECTOMY;  Surgeon: Lanelle Bal, DO;  [facility];  Service: Endoscopy;;   RADIOLOGY WITH ANESTHESIA N/A 09/04/2022   Procedure: TIPS;  Surgeon: Bennie Dallas, MD;  Location: [facility];  Service: Radiology;  Laterality: N/A;    Current Outpatient Medications  Medication Sig Dispense Refill   Cholecalciferol (VITAMIN D3) 125 MCG (5000 UT) TABS Take 5,000 Units by mouth in the morning.     ciprofloxacin (CIPRO) 500 MG tablet TAKE 1 TABLET BY MOUTH DAILY 30 tablet 5   cyclobenzaprine (FLEXERIL) 10 MG tablet Take 10 mg by mouth 3 (three) times daily as needed for muscle spasms.     DULoxetine (CYMBALTA) 60 MG capsule Take 120 mg by mouth daily.     FEROSUL 325 (65 Fe) MG tablet Take 325 mg by mouth 2 (two) times daily.     ferrous sulfate 325 (65 FE) MG EC tablet Take 325 mg by mouth in the morning and at bedtime.     furosemide (LASIX) 40 MG tablet Take 40 mg by mouth 2 (two) times daily.     lactulose (CHRONULAC) 10 GM/15ML solution Take 60 mLs (40 g total) by mouth 3 (three) times daily. Titrate to goal of 3-4 BMs daily. 946 mL 11   levOCARNitine (CARNITOR) 330 MG tablet Take 330 mg by mouth 3 (three) times daily.     Multiple Vitamin (MULTIVITAMIN) tablet Take 1 tablet by mouth in the morning.     ondansetron (ZOFRAN) 4 MG tablet Take 1 tablet (4 mg total) by mouth 3 (three) times daily before meals. 90 tablet 3   pantoprazole (PROTONIX) 40 MG tablet  TAKE 1 TABLET BY MOUTH DAILY 30 MINUTES BEFORE BREAKFAST, take instead of omeprazole (Patient taking differently: 40 mg 2 (two) times daily.) 90 tablet 3   spironolactone (ALDACTONE) 100 MG tablet Take 100 mg by mouth daily.     XIFAXAN 550 MG TABS tablet TAKE 1 TABLET BY MOUTH TWICE DAILY 180 tablet 3   Zinc 50 MG TABS Take 50 mg by mouth in the morning.     gabapentin (NEURONTIN) 300 MG capsule Take 300 mg by mouth 3 (three) times daily as needed (pain.). (Patient not taking: Reported on 11/23/2022)     No  current facility-administered medications for this visit.    Allergies as of 11/23/2022 - Review Complete 11/23/2022  Allergen Reaction Noted   Chlorthalidone  11/04/2019   Metoprolol  11/04/2019    Family History  Problem Relation Age of Onset   Brain cancer Mother    Diabetes Mother    Diabetes Father    Pulmonary embolism Brother    Liver disease Neg Hx     Social History   Socioeconomic History   Marital status: Single    Spouse name: Not on file   Number of children: Not on file   Years of education: Not on file   Highest education level: Not on file  Occupational History   Occupation: umemployed  Tobacco Use   Smoking status: Never    Passive exposure: Current   Smokeless tobacco: Never  Vaping Use   Vaping Use: Never used  Substance and Sexual Activity   Alcohol use: Not Currently   Drug use: Never   Sexual activity: Not Currently  Other Topics Concern   Not on file  Social History Narrative   Not on file   Social Determinants of Health   Financial Resource Strain: Not on file  Food Insecurity: Not on file  Transportation Needs: Not on file  Physical Activity: Not on file  Stress: Not on file  Social Connections: Not on file    Review of Systems: Gen: Denies fever, chills, cold or flulike symptoms presyncope, syncope. CV: Denies heart palpitations. Resp: Denies dyspnea at rest, cough. GI: See HPI Heme: See HPI  Physical Exam: BP 114/72 (BP  Location: Right Arm, Patient Position: Sitting, Cuff Size: Large)   Pulse 91   Temp 98.8 F (37.1 C) (Temporal)   Ht 5\' 8"  (1.727 m)   Wt 245 lb 6.4 oz (111.3 kg)   SpO2 99%   BMI 37.31 kg/m  General:   Alert and oriented. No distress noted. Pleasant and cooperative.  Head:  Normocephalic and atraumatic. Eyes:  Conjuctiva clear without scleral icterus. Heart:  S1, S2 present without murmurs appreciated. Lungs:  Clear to auscultation bilaterally. No wheezes, rales, or rhonchi. No distress.  Abdomen:  +BS, soft, and non-distended though appears slightly more full than previously.  Generalized abdominal tenderness.  No rebound or guarding. No HSM or masses noted. Msk:  Symmetrical without gross deformities. Normal posture. Extremities:  With  left greater than right lower extremity edema.  Left calf pain. Neurologic:  Alert and  oriented x4 Psych:  Normal mood and affect.    Assessment:  47 year old male with history of decompensated [place] cirrhosis, s/p TIPS 09/04/2022, liver lesions, IDA following with hematology, biliary dyskinesia with chronic abdominal pain but not a surgical candidate, presenting today for follow-up with chief complaint of abdominal pain, weight gain, fatigue, shortness of breath, chest pain, black stool.  Generalized abdominal pain: Chronic generalized postprandial abdominal pain with associated nausea likely secondary to known biliary dyskinesia.  However, he is reporting few months history of intermittent black stools though difficult to know if this is true melena versus secondary to oral iron.  His recent hemoglobin on 6/3 was stable at 11.1.  Last EGD October 2023 with portal hypertensive gastropathy, gastric polyps, duodenitis.  He has been compliant with PPI twice daily and denies NSAID use.  Likely need to repeat EGD at this time to reevaluate his upper GI tract, but will hold off on scheduling due to severe fatigue, shortness of breath, chest pain as discussed  below.   Additionally,  he reports feeling that he is reaccumulating fluid in his abdomen and we will need to rule out SBP.  Additional workup has been negative includes gastric emptying study last year.  CT angio with patent mesenteric vasculature in March 2024.  Recent repeat CT angio A/P with and without contrast 6/7 per IR with no acute findings, exam with patent mesenteric vasculature, s/p TIPS.   Will consider trial of low dicyclomine if no SBP.  Discussed dicyclomine extensively with patient today including possible side effects and the fact that we may have to increase lactulose further to ensure that he continues to have adequate bowel movements.  He is interested in at least trying this medication..  Weight gain: 6 pound weight gain over the last month.  He has evidence of lower extremity edema possibly small volume ascites.  No significant abdominal distention today that his abdomen does appear to be a little more full than his exam 1 month ago.  This is likely secondary to non strict adherence to low-sodium diet in the setting of decompensated cirrhosis. Unable to rule out LE DVT due to left>right LE edema.   Lower extremity edema, left greater than right: Bilateral lower extremity edema likely secondary to decompensated cirrhosis and likely noncompliance with 2 g sodium diet, but with left greater than right LE edema and left calf pain, concern for DVT.  Will arrange bilateral Doppler ultrasound to rule this out.  Limited on ability to adjust diuretics due to hyponatremia.  Fatigue/shortness of breath/chest pain: Severe fatigue, dyspnea on minimal exertion, and intermittent nonspecific chest pain that may occur with activity or when laying down since TIPS procedure.  He has been evaluated by IR who has not been able to explain the symptoms.  Chest x-ray was unrevealing.  CT angio A/P with and without contrast also unrevealing.  On exam today, he has worsening lower extremity edema with  left greater than right along with left calf pain concerning for DVT.  I am also concerned about possible PE considering his severe fatigue and shortness of breath following surgery.  Planning for lower extremity Doppler ultrasounds along with CT angio chest to rule this out.  He is also been advised on multiple occasions including today to reach out to his cardiologist to arrange follow-up.  If his CT is negative, will reiterate this and also send a message to his cardiologist requesting follow-up.   NASH Cirrhosis: Complicated by portal hypertension, esophageal varices, portal vein thrombosis previously on Eliquis (discontinued 09/2022), HE, SBP April 2022 on cipro for prophylaxis, refractory ascites s/p TIPS 09/04/2022. MELD 3.0 was 16 based on labs in May 2024. He has gained 6 lbs over the last month, has LE edema, and possible low volume ascites, so we will go ahead and repeat MELD labs at this time. As he has generalized abdominal pain, also ordering paracentesis to rule out SBP.  Notably, he has not been strictly compliant with low-sodium diet which is likely contributing to his recurrent edema and ascites.  Ability to adjust diuretics is limited due to hyponatremia.  He is not encephalopathic today but is reporting some issues with following along during conversations, so we will check an ammonia for him today.  He is having 3 bowel movements daily on lactulose 60 mL 3 times daily and also on Xifaxan twice daily.  EGD up to date October 2023 with small esophageal varices, portal gastropathy, due for surveillance October 2024, but will update sooner if possible due to intermittent black stools and  abdominal pain/nausea.   Hep A/B vaccination complete.   He was previously undergoing liver transplant evaluation at [facility], but this was closed as it was felt that a good candidate secondary to lack of support.    Black stools: 2.71-month history of intermittent black stools.  Difficult to  determine if this is true melena in the setting of oral iron.  Recent hemoglobin stable at 11.1.  He could very well have intermittent oozing in the setting of portal hypertensive gastropathy, gastric polyps, and duodenitis noted on last EGD.  He denies NSAIDs and reports compliance with PPI twice daily.  He was due for repeat EGD in October of this year, but will plan to update this in the near future once acute cardio/pulmonary etiology of his fatigue/shortness of breath/chest pain has been ruled out.  Will go ahead and repeat CBC to ensure stability.  Liver lesion: LI-RADS 3 liver lesions measuring 1.9 cm in the superior peripheral right liver and 2.5 cm along the anterior intrahepatic IVC noted on MRI in November 2023.  He was due for follow-up MRI in February, but this has not been completed.  He was scheduled for MRI 10/05/22 but had Feraheme infusion 5/9, and thus should not have MRI for 3 months.  He is on recall. I have asked for hematology to give Venofer moving forward. Of note, recent CT angio A/P with and without contrast with no arterially enhancing hepatic lesions. AFP previously normal. Should update at the time of upcoming MRI.     Plan:  CBC, CMP, INR, Ammonia.  US paracentesis STAT US dopper bilateral LE STAT CT chest angio STAT If paracentesis is negative, consider dicyclomine to help with chronic abdominal pain/biliary dyskinesia.  Increase Zofran to 3 times daily scheduled.  Continue Pantoprazole 40 mg BID Avoid NSAIDs Strict 2g/day sodium restriction.  Continue Spironolactone 100 mg daily Continue Lasix 40 mg BID Continue Lactulose 60 ml TID with goal of 3-4 Bms daily Continue Xifaxan 550 mg BID Continue Cipro 500 mg daily. Continue iron BID.  Needs EGD completed once cardio/pulmonary etiology of SOB/CP/Fatigue has been ruled out.  Due for colonoscopy later this year due to poor prep.   Follow-up TBD pending above work-up.    [name], PA-C [facility]  Gastroenterology 11/23/2022

## 2022-11-23 ENCOUNTER — Telehealth: Payer: Self-pay | Admitting: *Deleted

## 2022-11-23 ENCOUNTER — Ambulatory Visit: Payer: Medicaid Other | Admitting: Gastroenterology

## 2022-11-23 ENCOUNTER — Encounter: Payer: Self-pay | Admitting: Gastroenterology

## 2022-11-23 VITALS — BP 114/72 | HR 91 | Temp 98.8°F | Ht 68.0 in | Wt 245.4 lb

## 2022-11-23 DIAGNOSIS — R0602 Shortness of breath: Secondary | ICD-10-CM | POA: Diagnosis not present

## 2022-11-23 DIAGNOSIS — R0781 Pleurodynia: Secondary | ICD-10-CM | POA: Diagnosis not present

## 2022-11-23 DIAGNOSIS — K7460 Unspecified cirrhosis of liver: Secondary | ICD-10-CM | POA: Diagnosis not present

## 2022-11-23 DIAGNOSIS — R1084 Generalized abdominal pain: Secondary | ICD-10-CM | POA: Diagnosis not present

## 2022-11-23 DIAGNOSIS — R188 Other ascites: Secondary | ICD-10-CM

## 2022-11-23 DIAGNOSIS — K921 Melena: Secondary | ICD-10-CM | POA: Insufficient documentation

## 2022-11-23 DIAGNOSIS — R600 Localized edema: Secondary | ICD-10-CM | POA: Insufficient documentation

## 2022-11-23 DIAGNOSIS — R110 Nausea: Secondary | ICD-10-CM

## 2022-11-23 NOTE — Patient Instructions (Signed)
Have labs completed at [facility].   We are arrange for you to have the following : Paracentesis [place] of your legs to rule out a blood clot CT of your chest to rule out a blood clot  Increase Zofran to 3 times daily to prevent nausea.  Otherwise, continue your current medications.  Be sure that you are having at least 3-4 bowel movements every day with your lactulose.  You can increase your dose if needed.  It is important to follow a strict low-sodium diet.  Avoid all soups and canned foods, deli meats, processed foods, frozen/preprepared foods.  Further recommendations to follow.  [name], PA-C [facility] Gastroenterology

## 2022-11-23 NOTE — Telephone Encounter (Signed)
PA CTA Chest approved via carelon. Auth# [id number], DOS 6/20-8/18  Called pt. Aware of his appt details. US DVT and para scheduled for 6/21 arrival 300pm CTA Chest scheduled for 6/25, arrival 315pm.

## 2022-11-24 ENCOUNTER — Ambulatory Visit (HOSPITAL_COMMUNITY)
Admission: RE | Admit: 2022-11-24 | Discharge: 2022-11-24 | Disposition: A | Discharge status: Home / Self Care | Payer: Medicaid Other | Source: Ambulatory Visit | Attending: Gastroenterology | Admitting: Gastroenterology

## 2022-11-24 ENCOUNTER — Ambulatory Visit (HOSPITAL_COMMUNITY): Payer: Medicaid Other

## 2022-11-24 DIAGNOSIS — R188 Other ascites: Secondary | ICD-10-CM | POA: Diagnosis present

## 2022-11-24 DIAGNOSIS — K7460 Unspecified cirrhosis of liver: Secondary | ICD-10-CM | POA: Insufficient documentation

## 2022-11-24 DIAGNOSIS — R600 Localized edema: Secondary | ICD-10-CM | POA: Insufficient documentation

## 2022-11-24 DIAGNOSIS — R1084 Generalized abdominal pain: Secondary | ICD-10-CM | POA: Diagnosis present

## 2022-11-24 MED ORDER — ALBUMIN HUMAN 25 % IV SOLN
INTRAVENOUS | Status: AC
  Filled 2022-11-24: qty 100

## 2022-11-26 ENCOUNTER — Encounter: Payer: Self-pay | Admitting: Gastroenterology

## 2022-11-28 ENCOUNTER — Ambulatory Visit (HOSPITAL_COMMUNITY): Payer: Medicaid Other

## 2022-11-29 ENCOUNTER — Telehealth: Payer: Medicaid Other

## 2022-11-29 LAB — CBC WITH DIFFERENTIAL/PLATELET
Absolute Monocytes: 470 cells/uL (ref 200–950)
Eosinophils Absolute: 179 cells/uL (ref 15–500)
MCH: 34 pg — ABNORMAL HIGH (ref 27.0–33.0)
WBC: 4.7 10*3/uL (ref 3.8–10.8)

## 2022-11-29 LAB — PROTIME-INR: INR: 1.1

## 2022-11-30 LAB — COMPLETE METABOLIC PANEL WITH GFR
AG Ratio: 1.1 (calc) (ref 1.0–2.5)
ALT: 19 U/L (ref 9–46)
AST: 31 U/L (ref 10–40)
Albumin: 3.2 g/dL — ABNORMAL LOW (ref 3.6–5.1)
Alkaline phosphatase (APISO): 135 U/L — ABNORMAL HIGH (ref 36–130)
BUN: 12 mg/dL (ref 7–25)
CO2: 23 mmol/L (ref 20–32)
Calcium: 8.9 mg/dL (ref 8.6–10.3)
Chloride: 104 mmol/L (ref 98–110)
Creat: 0.97 mg/dL (ref 0.60–1.29)
Globulin: 3 g/dL (calc) (ref 1.9–3.7)
Glucose, Bld: 99 mg/dL (ref 65–99)
Potassium: 4.3 mmol/L (ref 3.5–5.3)
Sodium: 133 mmol/L — ABNORMAL LOW (ref 135–146)
Total Bilirubin: 1.9 mg/dL — ABNORMAL HIGH (ref 0.2–1.2)
Total Protein: 6.2 g/dL (ref 6.1–8.1)
eGFR: 96 mL/min/{1.73_m2} (ref >=60)

## 2022-11-30 LAB — PROTIME-INR: Prothrombin Time: 11.9 s — ABNORMAL HIGH (ref 9.0–11.5)

## 2022-11-30 LAB — CBC WITH DIFFERENTIAL/PLATELET
Basophils Absolute: 28 cells/uL (ref 0–200)
Basophils Relative: 0.6 %
Eosinophils Relative: 3.8 %
HCT: 33.6 % — ABNORMAL LOW (ref 38.5–50.0)
Hemoglobin: 11.9 g/dL — ABNORMAL LOW (ref 13.2–17.1)
Lymphs Abs: 291 cells/uL — ABNORMAL LOW (ref 850–3900)
MCHC: 35.4 g/dL (ref 32.0–36.0)
MCV: 96 fL (ref 80.0–100.0)
MPV: 10.2 fL (ref 7.5–12.5)
Monocytes Relative: 10 %
Neutro Abs: 3732 cells/uL (ref 1500–7800)
Neutrophils Relative %: 79.4 %
Platelets: 76 10*3/uL — ABNORMAL LOW (ref 140–400)
RBC: 3.5 10*6/uL — ABNORMAL LOW (ref 4.20–5.80)
RDW: 12.8 % (ref 11.0–15.0)
Total Lymphocyte: 6.2 %

## 2022-11-30 LAB — AMMONIA: Ammonia: 98 umol/L — ABNORMAL HIGH (ref <=72)

## 2022-12-01 ENCOUNTER — Other Ambulatory Visit: Payer: Self-pay | Admitting: Gastroenterology

## 2022-12-01 DIAGNOSIS — K828 Other specified diseases of gallbladder: Secondary | ICD-10-CM

## 2022-12-01 DIAGNOSIS — K7460 Unspecified cirrhosis of liver: Secondary | ICD-10-CM

## 2022-12-01 MED ORDER — DICYCLOMINE HCL 10 MG PO CAPS
10.0000 mg | ORAL_CAPSULE | Freq: Every day | ORAL | 1 refills | Status: DC
Start: 2022-12-01 — End: 2023-01-21

## 2022-12-04 ENCOUNTER — Encounter (HOSPITAL_COMMUNITY): Payer: Self-pay

## 2022-12-04 ENCOUNTER — Ambulatory Visit (HOSPITAL_COMMUNITY): Admission: RE | Admit: 2022-12-04 | Payer: Medicaid Other | Source: Ambulatory Visit

## 2022-12-06 ENCOUNTER — Ambulatory Visit (INDEPENDENT_AMBULATORY_CARE_PROVIDER_SITE_OTHER): Payer: Medicaid Other | Admitting: Clinical

## 2022-12-06 DIAGNOSIS — F411 Generalized anxiety disorder: Secondary | ICD-10-CM

## 2022-12-06 DIAGNOSIS — F0630 Mood disorder due to known physiological condition, unspecified: Secondary | ICD-10-CM | POA: Diagnosis not present

## 2022-12-06 NOTE — Progress Notes (Signed)
IN PERSON   I connected with Alejandro Porter on 11/01/22 at 11:00 AM EST in person and verified that I am speaking with the correct person using two identifiers.   Location: Patient: Office Provider: Office   I discussed the limitations of evaluation and management by telemedicine and the availability of in person appointments. The patient expressed understanding and agreed to proceed. ( IN PERSON_)     THERAPIST PROGRESS NOTE   Session Time: 2:00 PM- 2:45 PM   Participation Level: Active   Behavioral Response: CasualAlertAnxious   Type of Therapy: Individual Therapy   Treatment Goals addressed: Coping for anxiety and depression   Interventions: CBT, Motivational Interviewing, Strength-based and Supportive   Summary: Alejandro Porter is a 47 y.o. 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 The OPT therapist worked with the patient for his ongoing OPT treatment. The OPT therapist utilized Motivational Interviewing to assist in creating therapeutic repore. The patient in the session was engaged and work in collaboration giving feedback about his triggers and symptoms over the past few weeks. The patient spoke about this physical health condition with his stage 2 liver diease as well as the impact of trying to caregiver for his Father who is in ailing health as well as ongoing. discord in the family with his sibling who is also involved in caregiving for their Father and living in the home with the patient and his Father. The OPT therapist utilized Cognitive Behavioral Therapy through cognitive restructuring as well as worked with the patient on coping strategies to assist in management of his diagnoses as well as his thought process and positive thinking. The OPT therapist worked with the patient on managing interactions in the home, utilizing coping skills, and work on Sport and exercise psychologist.     Suicidal/Homicidal: Nowithout intent/plan   Therapist Response: The  OPT therapist worked with the patient for the patients scheduled session. The patient was engaged in his session and gave feedback in relation to triggers, symptoms, and behavior responses over the past few weeks.The patient reviewed his current schedule and routine throughout the week. The OPT therapist worked with the patient utilizing an in session Cognitive Behavioral Therapy exercise. The patient was responsive in the session and verbalized, "I have been trying what you said last time and walking away when I start to get angry and that has helped".. The OPT therapist worked with the patient on management of his reactive behavior and not letting things escalate in the home, focus on self care, and consistency with his health appointments..The OPT therapist will continue treatment work with the patient in his next scheduled session.    Plan: Return again in 3 weeks.   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 The patient was provided an opportunity to ask questions and all were answered. The patient agreed with the plan and demonstrated an understanding of the instructions.   The patient was advised to call back or seek an in-person evaluation if the symptoms worsen or if the condition fails to improve as anticipated.   I provided 45 minutes of face-to-face time during this encounter.   Winfred Burn, LCSW   12/06/2022

## 2022-12-07 LAB — BASIC METABOLIC PANEL
BUN: 12 mg/dL (ref 7–25)
CO2: 25 mmol/L (ref 20–32)
Calcium: 8.8 mg/dL (ref 8.6–10.3)
Chloride: 100 mmol/L (ref 98–110)
Creat: 1.07 mg/dL (ref 0.60–1.29)
Glucose, Bld: 88 mg/dL (ref 65–99)
Potassium: 4.1 mmol/L (ref 3.5–5.3)
Sodium: 131 mmol/L — ABNORMAL LOW (ref 135–146)

## 2022-12-13 ENCOUNTER — Telehealth: Payer: Self-pay | Admitting: Gastroenterology

## 2022-12-13 NOTE — Telephone Encounter (Signed)
Spoke with patient today.  Reports he is doing fairly well.  States that dicyclomine is really helping his abdominal pain.  He is having about 2 bowel movements a day with lactulose.  Recommended increasing lactulose so that he always has at least 3 bowel movements every day.  Advised I would like to get him in the office for follow-up in the next few weeks considering we have made some medication changes including diuretic adjustments since I saw him in the office last.  Mandy: Please arrange follow-up for patient in 2-3 weeks.

## 2022-12-18 ENCOUNTER — Encounter: Payer: Self-pay | Admitting: Gastroenterology

## 2022-12-18 ENCOUNTER — Ambulatory Visit (HOSPITAL_COMMUNITY)
Admission: RE | Admit: 2022-12-18 | Discharge: 2022-12-18 | Disposition: A | Discharge status: Home / Self Care | Payer: Medicaid Other | Source: Ambulatory Visit | Attending: Gastroenterology | Admitting: Gastroenterology

## 2022-12-18 DIAGNOSIS — R600 Localized edema: Secondary | ICD-10-CM | POA: Insufficient documentation

## 2022-12-18 DIAGNOSIS — R0602 Shortness of breath: Secondary | ICD-10-CM | POA: Diagnosis present

## 2022-12-18 MED ORDER — IOHEXOL 350 MG/ML SOLN
80.0000 mL | Freq: Once | INTRAVENOUS | Status: AC | PRN
  Administered 2022-12-18: 75 mL via INTRAVENOUS

## 2022-12-19 ENCOUNTER — Other Ambulatory Visit: Payer: Self-pay | Admitting: Gastroenterology

## 2022-12-19 DIAGNOSIS — R110 Nausea: Secondary | ICD-10-CM

## 2022-12-30 NOTE — Progress Notes (Signed)
Referring Provider: Reather Converse, PA-C Primary Care Physician:  Reather Converse, PA-C Primary GI Physician: Dr. Jena Gauss  Chief Complaint  Patient presents with   Follow-up    Follow up. Pain in right side.     HPI:   Alejandro Porter is a 47 y.o. male presenting today for follow-up of cirrhosis, abdominal pain, nausea without vomiting.  He has hstory of cirrhosis complicated by portal hypertension, esophageal varices, portal vein thrombosis previously on Eliquis (discontinued 09/2022), hepatic encephalopathy on lactulose and Xfiaxan, ascites requiring repeat paras, history of SBP in April 2022 , liver lesions noted on MRI November 2023, months currently overdue for surveillance MRI but on hold until August due to receiving Feraheme, IDA followed by Hematology, biliary dyskinesia but not a surgical candidate, GERD. He was undergoing liver transplant eval at [facility], but this was closed as it was felt he was not a good candidate secondary to the lack of support.  Developed worsening ascites requiring increasingly frequent paracentesis with inability to titrate diuretics due to worsening renal funtion, hyponatremia, and ultimately underwent TIPS procedure 09/04/22.   Last seen in office 11/23/2022.  GERD controlled on pantoprazole twice daily.  Continue with postprandial abdominal pain and nausea limiting p.o. intake.  He was taking Zofran twice daily.  Complained of some back pain.  Thought he hurt his back when he was doing laundry.  Also with LE edema, worse in left lower extremity stating it could be 4 times the size of his right leg.  Also, his abdomen was good and swollen.  He was taking spironolactone 100 mg daily and Lasix 40 mg twice daily.  Trying to follow 2 g sodium restriction, but still eating soup here and there.  Some forgetfulness, "fading out" during conversation.  Noted intermittent black stools but also taking iron. Also with ongoing fatigue, SOB, intermittent CP since  TIPS. Plan included labs, US paracentesis, US doppler LE, CT chest angio, consider dicyclomine if para is negative, increase Zofran to TID, otherwise, continue current medications.   He did not have enough ascites for paracentesis on 6/21.   LE dopplers without DVT on 6/21.   CT chest angio 12/18/22 without PE.Multiple nodular opacities and bronchiolar impactions of the left lung base, unchanged compared to prior examinations. Findings areconsistent with chronic sequelae of infection or aspiration.   Labs completed 6/26 revealing hemoglobin improved to 11.9, platelets 76, sodium 133, albumin 3.2, total bilirubin 1.9, alk phos 135, AST 31, ALT 19, INR 1.1, ammonia 98.  Patient reported he was forgetting to take his third dose of lactulose at times.  Recommended increasing spironolactone by adding 50 mg in the evening. Dicyclomine also added and advised that he may have to up titrate lactulose to allow 3-4 Bms daily.    Today:  Reports he is having a lot of back pain radiating to his sides.  This has continued since I saw him last time.  Started after he fell when doing laundry.  Has not followed up with PCP on this.  Also still having chest pain. Notices when he is up moving around. Hasn't called cardiology yet. Gets distracted when he gets home.   Cirrhosis:  MELD 3.0  13 on 11/29/22 Imaging: Most recent imaging includes CT angio A/P with and without contrast 11/10/2022 with no arterially enhancing lesions to suggest underlying HCC.  AFP: 2.06 March 2022.  Hep A/B vaccination: Complete.  EGD: Oct 2023; small esophageal varices, portal gastropathy, multiple gastric polyps s/p resection and retrieval (hyperplastic)  duodenitis. Recommended 1 year surveillance.  BB: No Ascites/peripheral edema: None.  Resolved.  Patient was not sure what dose of diuretics he was taking, but called when he got home.  Reports he is taking 1/2 tablet of Lasix twice daily and 100 mg of spironolactone daily. History of  SBP: Yes. On Cipro daily.  Encephalopathy:  None currently on 70 ml 2-3 times a day.  Forgets to take a third dose a few times a week. Only having 2 Bms a day or occasionally skipping a day without a bowel movement.  He is taking Xifaxan BID.    Abdominal pain/nausea:  Still present, but more tolerable with dicyclomine daily.  Nausea seems to be under control, but reports he is taking 3 Zofran every morning.   No GERD symptoms.   No BRBPR or melena.   Colonoscopy Oct 2023: fair colon prep, non-bleeding internal hemorrhoids and rectal varices. Stool in colon. 1 year surveillance due to poor prep.     *Notably, patient tells me today that he has some medications he is receiving in a pill pack and other medications he is taking out of a bottle.  He is unable to recall all of his medications and how he is taking them today in the office. I spoke with [facility] drug.  They have confirmed patients current medication list and it has been updated. They advised that they are not able to package spironolactone in his pill pack due to to being a "hazardous" medication, but they stated he does have a reminder in his pill pack to take the spironolactone.  They are not sure why the ciprofloxacin has not been package, but will start doing this next month.  Regarding his lactulose, I requested that reminder be added to his pill pack for this as well.  Patient did call me back when he got home to go over medications. He confirmed that he is only taking pantoprazole 40 mg daily. Also confirmed that he is taking spironolactone 100 mg in the morning only. He is taking cipro daily, but it is not packaged.   Past Medical History:  Diagnosis Date   Anxiety 08/2022   CKD (chronic kidney disease) stage 2, GFR 60-89 ml/min 05/24/2022   stage 3a   Depression 06/2022   GERD (gastroesophageal reflux disease)    H/O colonoscopy    H/O endoscopy    Headache    Hypertension    Liver cirrhosis (HCC)    Neuropathy    OSA  (obstructive sleep apnea)    not currently using CPAP   S/P abdominal paracentesis     Past Surgical History:  Procedure Laterality Date   BIOPSY  09/12/2019   Procedure: BIOPSY;  Surgeon: Corbin Ade, MD;  [facility];  Service: Endoscopy;;   COLONOSCOPY N/A 09/18/2019   Dr. Darrick Penna: 12 colon polyps removed, multiple simple adenomas and some inflammatory polyps, diverticulosis, external and internal hemorrhoids.  Next colonoscopy in 3 years.   COLONOSCOPY WITH PROPOFOL N/A 03/24/2022   Procedure: COLONOSCOPY WITH PROPOFOL;  Surgeon: Lanelle Bal, DO;  [facility];  Service: Endoscopy;  Laterality: N/A;  1:30pm, asa 3   ESOPHAGEAL BANDING N/A 09/12/2019   Procedure: ESOPHAGEAL BANDING;  Surgeon: Corbin Ade, MD;  [facility];  Service: Endoscopy;  Laterality: N/A;   ESOPHAGOGASTRODUODENOSCOPY N/A 09/12/2019   Dr. Jena Gauss: Mild erosive reflux esophagitis.  Schatzki ring status post dilation.  Small grade 1/grade 2 esophageal varices, portal hypertensive gastropathy, hyperplastic  gastric polyp, gastric erosions with chronic inactive gastritis on biopsies, no H. pylori.   ESOPHAGOGASTRODUODENOSCOPY (EGD) WITH PROPOFOL N/A 12/16/2020   two short columns of no more than Grade 2 esophageal varices, appearing innocent. Grade 1 varices not apparent today. Multiple large posterior body and antral hyperplastic, hemorrhagic appearing polyps, larges approximately 2.5 cm pedunculated. Portal gastropathy. Normal duodenum. Polypectomy with clips placed. EGD in 18 months.   ESOPHAGOGASTRODUODENOSCOPY (EGD) WITH PROPOFOL N/A 03/24/2022   Procedure: ESOPHAGOGASTRODUODENOSCOPY (EGD) WITH PROPOFOL;  Surgeon: Lanelle Bal, DO;  [facility];  Service: Endoscopy;  Laterality: N/A;   IR ANGIOGRAM SELECTIVE EACH ADDITIONAL VESSEL  09/04/2022   IR EMBO VENOUS NOT HEMORR HEMANG  INC GUIDE ROADMAPPING  09/04/2022   IR INTRAVASCULAR ULTRASOUND NON CORONARY   09/04/2022   IR PARACENTESIS  09/04/2022   IR RADIOLOGIST EVAL & MGMT  07/19/2022   IR RADIOLOGIST EVAL & MGMT  08/14/2022   IR RADIOLOGIST EVAL & MGMT  09/29/2022   IR RADIOLOGIST EVAL & MGMT  10/31/2022   IR RADIOLOGIST EVAL & MGMT  11/20/2022   IR TIPS  09/04/2022   IR US GUIDE VASC ACCESS RIGHT  09/04/2022   POLYPECTOMY  09/18/2019   Procedure: POLYPECTOMY;  Surgeon: West Bali, MD;  [facility];  Service: Endoscopy;;   POLYPECTOMY  12/16/2020   Procedure: POLYPECTOMY;  Surgeon: Corbin Ade, MD;  [facility];  Service: Endoscopy;;  gastric   POLYPECTOMY  03/24/2022   Procedure: POLYPECTOMY;  Surgeon: Lanelle Bal, DO;  [facility];  Service: Endoscopy;;   RADIOLOGY WITH ANESTHESIA N/A 09/04/2022   Procedure: TIPS;  Surgeon: Bennie Dallas, MD;  Location: [facility];  Service: Radiology;  Laterality: N/A;    Current Outpatient Medications  Medication Sig Dispense Refill   Cholecalciferol (VITAMIN D3) 125 MCG (5000 UT) TABS Take 5,000 Units by mouth in the morning.     ciprofloxacin (CIPRO) 500 MG tablet TAKE 1 TABLET BY MOUTH DAILY 30 tablet 5   cyclobenzaprine (FLEXERIL) 10 MG tablet Take 10 mg by mouth 3 (three) times daily as needed for muscle spasms.     dicyclomine (BENTYL) 10 MG capsule Take 1 capsule (10 mg total) by mouth daily before breakfast. 30 capsule 1   DULoxetine (CYMBALTA) 60 MG capsule Take 120 mg by mouth daily.     FEROSUL 325 (65 Fe) MG tablet Take 325 mg by mouth 2 (two) times daily.     furosemide (LASIX) 40 MG tablet Take 20 mg by mouth 2 (two) times daily.     gabapentin (NEURONTIN) 300 MG capsule Take 300 mg by mouth 3 (three) times daily as needed (pain.).     lactulose (CHRONULAC) 10 GM/15ML solution Take 60 mLs (40 g total) by mouth 3 (three) times daily. Titrate to goal of 3-4 BMs daily. 946 mL 11   levOCARNitine (CARNITOR) 330 MG tablet Take 330 mg by mouth 3 (three) times daily.     Multiple Vitamin (MULTIVITAMIN) tablet  Take 1 tablet by mouth in the morning.     ondansetron (ZOFRAN) 4 MG tablet TAKE 1 TABLET BY MOUTH THREE TIMES DAILY BEFORE MEALS 90 tablet 3   spironolactone (ALDACTONE) 100 MG tablet Take 100 mg by mouth daily.     XIFAXAN 550 MG TABS tablet TAKE 1 TABLET BY MOUTH TWICE DAILY 180 tablet 3   Zinc 50 MG TABS Take 50 mg by mouth in the morning.     pantoprazole (PROTONIX)  40 MG tablet Take 1 tablet (40 mg total) by mouth 2 (two) times daily before a meal. 60 tablet 5   No current facility-administered medications for this visit.    Allergies as of 01/01/2023 - Review Complete 01/01/2023  Allergen Reaction Noted   Chlorthalidone  11/04/2019   Metoprolol  11/04/2019    Family History  Problem Relation Age of Onset   Brain cancer Mother    Diabetes Mother    Diabetes Father    Pulmonary embolism Brother    Liver disease Neg Hx     Social History   Socioeconomic History   Marital status: Single    Spouse name: Not on file   Number of children: Not on file   Years of education: Not on file   Highest education level: Not on file  Occupational History   Occupation: umemployed  Tobacco Use   Smoking status: Never    Passive exposure: Current   Smokeless tobacco: Never  Vaping Use   Vaping status: Never Used  Substance and Sexual Activity   Alcohol use: Not Currently   Drug use: Never   Sexual activity: Not Currently  Other Topics Concern   Not on file  Social History Narrative   Not on file   Social Determinants of Health   Financial Resource Strain: Not on file  Food Insecurity: Low Risk  (05/22/2022)   [facility]   Food vital sign    Within the past 12 months, you worried that your food would run out before you got money to buy more: Never true    Within the past 12 months, the food you bought just didn't last and you didn't have money to get more. : Never true  Transportation Needs: Not on file (05/22/2022)  Physical Activity: Not  on file  Stress: Not on file  Social Connections: Not on file    Review of Systems: Gen: Denies fever, chills, cold or flulike symptoms, presyncope, syncope. CV: Admit to chest pain.  No palpitations. Resp: Admits to shortness of breath and cough. GI: See HPI  Heme: See HPI  Physical Exam: BP 120/76 (BP Location: Right Arm, Patient Position: Sitting, Cuff Size: Large)   Pulse 82   Temp 98.1 F (36.7 C) (Temporal)   Ht 5\' 8"  (1.727 m)   Wt 237 lb (107.5 kg)   SpO2 99%   BMI 36.04 kg/m  General:   Alert and oriented. No distress noted. Pleasant and cooperative.  Head:  Normocephalic and atraumatic. Eyes:  Conjuctiva clear without scleral icterus. Heart:  S1, S2 present without murmurs appreciated. Lungs:  Clear to auscultation bilaterally. No wheezes, rales, or rhonchi. No distress.  Abdomen:  +BS, soft, and non-distended. Mild TTP in RUQ. No rebound or guarding. No HSM or masses noted. Msk:  Symmetrical without gross deformities. Normal posture. Extremities:  Without edema. Neurologic:  Alert and  oriented x4 Psych:  Normal mood and affect.    Assessment:  47 year old male with history of decompensated Nash cirrhosis, s/p TIPS 09/04/2022, liver lesions, IDA following with hematology, biliary dyskinesia with chronic abdominal pain but not a surgical candidate, presenting today for follow-up.   MASH Cirrhosis:  Complicated by portal hypertension, esophageal varices, portal vein thrombosis previously on Eliquis (discontinued 09/2022), HE, SBP April 2022 on cipro for prophylaxis, refractory ascites s/p TIPS 09/04/2022. He is not a transplant candidate due to lack of social support. MELD 3.0 is 13 based on labs in June. No significant ascites  or peripheral edema on exam today, currently taking Lasix 40 mg BID and spironolactone 100 mg in the morning. No overt encephalopathy today though he is having trouble remembering his medications in the office today and states he sometimes forgets to  take lactulose. He is compliant with Xifaxan BID.   EGD is  up to date October 2023 with small esophageal varices, portal gastropathy, due for surveillance October 2024.   Hep A/B vaccination complete.   Liver lesion: LI-RADS 3 liver lesions measuring 1.9 cm in the superior peripheral right liver and 2.5 cm along the anterior intrahepatic IVC noted on MRI in November 2023.  He was due for follow-up MRI in February, but this has not been completed.  He was scheduled for MRI 10/05/22 but had Feraheme infusion 5/9, and thus should not have MRI for 3 months.  He is on recall. I have asked for hematology to give Venofer moving forward.   Chronic postprandial abdominal pain/nausea:  Likely secondary to known biliary dyskinesia. Overall improved with dicyclomine once daily and Zofran, but he is currently taking 12 mg of Zofran in the morning. He was advised that is too much Zofran at once and that he needs to take 4 mg in the morning and can take an additional pill before lunch and dinner if needed, but also advised we need to  limit this medication to the lowest effective dose as this can also worsen constipation. He would likely benefit from dicyclomine 3 times a day before meals, but he is already having to take significant amounts of lactulose to have 2-3 BMs a day, so we will hold off on adjusting dicyclomine for now. I will however, increase PPI back to twice daily. Unclear how long he has been taking once daily.   Chest pain:  Ongoing. Patient has yet to call cardiology for follow-up as I have recommended multiple times now, so I will send a message to his primary cardiology to see if his office will arrange for follow-up as we need this to be evaluated prior to endoscopy.   IDA:  Following with hematology, receiving IV iron as needed.  Most recent Hgb 11.9 on 11/29/22. Anemia likely related to occult GI blood loss in the setting of portal gastropathy, but he has not had a complete colonoscopy.  Colonoscopy attempted in October 2023 which revealed non-bleeding internal hemorrhoids and rectal varices, but his prep was poor, so he was advised to return in 1 year for surveillance.    Plan:  Increase pantoprazole to 40 mg BID. New Rx sent to pharmacy.  Continue dicyclomine 10 mg daily.  Zofran 4 mg in the morning before breakfast. Can take before lunch and dinner if needed.  Continue 70 ml of lactulose TID with goal of 2-3 Bms daily. Discussed importance of not missing doses and never going a day without a BM.  Continue Xifaxan 550 mg BID.  Continue ciprofloxacin 50 mg daily.  Continue lasix 40 mg BID and spironolactone 100 mg daily.  Will arrange MRI with liver protocol.  Will send request to cardiology to arrange follow-up for chest pain.  EGD and colonoscopy due in October, but we are holding off on arranging this until chest pain has been evaluated by cardiology.  Follow-up in 8 weeks.    [name], PA-C [facility] 01/01/2023

## 2023-01-01 ENCOUNTER — Ambulatory Visit: Payer: Medicaid Other | Admitting: Gastroenterology

## 2023-01-01 ENCOUNTER — Encounter: Payer: Self-pay | Admitting: Gastroenterology

## 2023-01-01 VITALS — BP 120/76 | HR 82 | Temp 98.1°F | Ht 68.0 in | Wt 237.0 lb

## 2023-01-01 DIAGNOSIS — K7460 Unspecified cirrhosis of liver: Secondary | ICD-10-CM | POA: Diagnosis not present

## 2023-01-01 DIAGNOSIS — R1011 Right upper quadrant pain: Secondary | ICD-10-CM

## 2023-01-01 DIAGNOSIS — R110 Nausea: Secondary | ICD-10-CM | POA: Diagnosis not present

## 2023-01-01 DIAGNOSIS — K219 Gastro-esophageal reflux disease without esophagitis: Secondary | ICD-10-CM

## 2023-01-01 DIAGNOSIS — R079 Chest pain, unspecified: Secondary | ICD-10-CM

## 2023-01-01 MED ORDER — PANTOPRAZOLE SODIUM 40 MG PO TBEC: 40.0000 mg | DELAYED_RELEASE_TABLET | Freq: Every day | ORAL | 3 refills | Status: DC

## 2023-01-01 NOTE — Patient Instructions (Addendum)
Please call us when you get home to update me on what medications you are taking. If I am not available when you call, I will plan to call you back this afternoon.   You need to be taking enough lactulose to have 2 or 3 bowel movements every day.  It is important that you never skip a day without a bowel movement.  I recommend that you take 70 mL of lactulose 3 times a day starting out.  We can increase this further if we need to.  It is important that you do not forget to take any doses of your lactulose.   You can set alarms on your phone or make notes that you will see to remind yourself to take your medication.  Regarding your nausea medications, Zofran:  You are currently taking too much of this medication at 1 time. I would like for you to take one 4 mg pill in the morning before breakfast and see how you do. You can increase this up to 3 times a day before meals if you need to take this much.  However, I would like to limit this medication to the lowest effective dose as this can also worsen constipation.   Continue taking dicyclomine once daily to help with abdominal pain/nausea related to your gallbladder.  Continue taking pantoprazole 40 mg twice a day.  Please call your primary care doctor to arrange an appointment to follow-up on your back pain.  I will send a message to Dr. Diona Browner requesting their office reach out to you to schedule an appointment to further evaluate your chest pain.  We will plan to follow-up with you in 8 weeks or sooner if needed.  Do not hesitate to call if you have any questions or concerns.  [name], PA-C [facility]astroenter[facility]

## 2023-01-03 ENCOUNTER — Telehealth: Payer: Self-pay | Admitting: Gastroenterology

## 2023-01-03 NOTE — Telephone Encounter (Signed)
I called to speak with [facility] drug regarding patient's medications/pill pack that he is receiving.  Currently, his spironolactone and ciprofloxacin are not in his pill pack.  They stated spironolactone cannot be added to the pill pack, but they do put a reminder pack in for spironolactone to help him remember this.  They will plan to start putting ciprofloxacin in his pill pack next month.  They also confirmed that he is only getting pantoprazole 40 mg once a day rather than twice a day.  I requested to increase this to twice a day and I have sent in a new prescription.  I have tried to reach patient to let him know that I have sent in a new prescription for pantoprazole, increasing the dose to twice a day.  The pharmacy stated that they can either give him the medication in a regular pill bottle for now as he just picked up his pill pack a couple of days ago, or he can take the pill pack back to the pharmacy for it to be repackaged with the addition of pantoprazole in the evening.  Unfortunately, I have not been able to reach Alejandro Porter.  I did leave a message requesting return call.  I also tried to reach his father, but his mailbox is full.  Courtney: Can you try to reach patient tomorrow to let him know that I have increased his pantoprazole to twice daily?  The pharmacy can either give him the medication in a pill bottle to take the additional dose needed until it is time for his pill pack to be renewed, then they will place it in the pill pack twice daily.  The other option is that he can take his pill pack back to the pharmacy and they will repackaged the medication so that both doses are in the pack.  Also, please let him know that I have asked for ciprofloxacin to be in his pill pack next time.

## 2023-01-04 NOTE — Telephone Encounter (Signed)
Spoke to pt, informed him that you increased his pantoprazole to twice a day. He states he will like them in the bottle and they can be packed next time. He states he dropped his phone in water and it is not working at this time. He is going to try and get a new one this weekend. I called his dad's phone.

## 2023-01-04 NOTE — Telephone Encounter (Signed)
Noted. He can contact his pharmacy for the pantoprazole.

## 2023-01-04 NOTE — Telephone Encounter (Signed)
Noted. Told him to call pharmacy

## 2023-01-07 ENCOUNTER — Encounter: Payer: Self-pay | Admitting: Gastroenterology

## 2023-01-07 ENCOUNTER — Telehealth: Payer: Self-pay | Admitting: Gastroenterology

## 2023-01-07 MED ORDER — PANTOPRAZOLE SODIUM 40 MG PO TBEC
40.0000 mg | DELAYED_RELEASE_TABLET | Freq: Two times a day (BID) | ORAL | 5 refills | Status: DC
Start: 2023-01-07 — End: 2023-07-02

## 2023-01-07 NOTE — Telephone Encounter (Signed)
Patient is due for MRI  with liver protocol this month. Please arrange.  Dx: Cirrhosis, follow-up on LI-RADS 3 liver lesion   You will likely need to call patient's father's number as patient's phone has not been working recently.

## 2023-01-08 ENCOUNTER — Telehealth: Payer: Self-pay | Admitting: *Deleted

## 2023-01-08 ENCOUNTER — Other Ambulatory Visit: Payer: Self-pay | Admitting: *Deleted

## 2023-01-08 DIAGNOSIS — K7460 Unspecified cirrhosis of liver: Secondary | ICD-10-CM

## 2023-01-08 NOTE — Telephone Encounter (Signed)
Carelon PA: Order ID: [id number]       Authorized  Approval Valid Through: 01/08/2023 - 03/08/2023

## 2023-01-08 NOTE — Telephone Encounter (Signed)
-----   Message from Nona Dell sent at 01/07/2023  8:39 PM EDT ----- Regarding: RE: Chest Pain I will forward to my [place] nurse to see if we can set up a follow-up. Thank you. ----- Message ----- From: Hal Neer Sent: 01/07/2023   4:26 PM EDT To: Jonelle Sidle, MD Subject: Chest Pain                                     Hi Dr. Diona Browner,   Wanted to see if your office could get this patient scheduled for follow-up due to reports of chest pain. I have asked the patient several times to reach out to you for a visit, but he hasn't yet, and every time I see him in the office, he continues to report intermittent chest pain.   He is due for EGD and colonoscopy in October, but I am holding off on scheduling until chest pain is evaluated.   Thanks!   [name], PA-C [facility]astroenter[facility]

## 2023-01-08 NOTE — Telephone Encounter (Signed)
Spoke with pt and informed him of MRI on 01/19/23, arrive at 7:45 am, nothing to eat or drink 4 hours prior. Verbalized understanding.

## 2023-01-08 NOTE — Telephone Encounter (Signed)
Called pt's father Alejandro Porter (on [place]). Unable to leave vm due to mail box being full

## 2023-01-08 NOTE — Telephone Encounter (Signed)
Contacted and scheduled appointment with Diona Browner on 01/10/2023 @2 :20 pm [place] office. Patient aware that appointment is in [place].

## 2023-01-10 ENCOUNTER — Encounter: Payer: Self-pay | Admitting: Cardiology

## 2023-01-10 ENCOUNTER — Ambulatory Visit: Payer: Medicaid Other | Attending: Cardiology | Admitting: Cardiology

## 2023-01-10 VITALS — BP 130/80 | HR 101 | Ht 68.0 in | Wt 241.8 lb

## 2023-01-10 DIAGNOSIS — R0789 Other chest pain: Secondary | ICD-10-CM

## 2023-01-10 DIAGNOSIS — Z01810 Encounter for preprocedural cardiovascular examination: Secondary | ICD-10-CM

## 2023-01-10 DIAGNOSIS — R0602 Shortness of breath: Secondary | ICD-10-CM

## 2023-01-10 NOTE — Progress Notes (Signed)
Cardiology Office Note  Date: 01/10/2023   ID: Yuan Ilgenfritz, DOB 02-21-1976, MRN [id number]  History of Present Illness: Alejandro Porter is a 47 y.o. male last seen in October 2023.  He presents to the office for preprocedure evaluation at the request of Dr. Levon Hedger with plan for EGD and colonoscopy in October.  I reviewed the recent GI office note.  He has cirrhosis complicated by portal hypertension, esophageal varices, and portal vein thrombosis as well as prior hepatic encephalopathy and ascites.  He underwent prior liver transplant evaluation and was not felt to be good candidate.  He has required recurrent paracenteses and ultimately underwent TIPS procedure in April of this year.  He is referred with concerns about chest pain.  We discussed his symptoms today.  He states that he has a feeling of aching pain pointing from his right lower costal area to his left lower costal area, also a stabbing pain in his chest, tends to be worse at nighttime, not specifically exertional.  No reported cough.  He states that when he takes gabapentin and Flexeril the symptoms eventually ease off.  Can also exacerbate symptoms when he takes a deep breath sometimes.  Last echocardiogram was in July 2022 at which point LVEF was 55 to 60%.  He has no known history of obstructive CAD or prior myocardial infarction.  Chest CTA in July did not demonstrate obvious pulmonary embolus within the limitations of the study with stable nodular opacities and bronchiolar impactions, reported cardiomegaly.  He states that he has not used CPAP consistently for the last few months, indicates problems with some of the equipment.  He does have a visit scheduled with [facility] on the 16th of this month.  ECG today shows sinus tachycardia.  I reviewed his medications.  Physical Exam: VS:  BP 130/80   Pulse (!) 101   Ht 5\' 8"  (1.727 m)   Wt 241 lb 12.8 oz (109.7 kg)   SpO2 97%   BMI 36.77 kg/m , BMI Body mass  index is 36.77 kg/m.  Wt Readings from Last 3 Encounters:  01/10/23 241 lb 12.8 oz (109.7 kg)  01/01/23 237 lb (107.5 kg)  11/23/22 245 lb 6.4 oz (111.3 kg)    General: Patient appears comfortable at rest. HEENT: Conjunctiva and lids normal. Neck: Supple, no elevated JVP or carotid bruits. Lungs: Decreased basilar breath sounds with few rhonchi, no wheezing. Cardiac: Regular rate and rhythm, no S3 or significant systolic murmur, no pericardial rub. Abdomen: Protuberant, bowel sounds present. Extremities: No pitting edema.  ECG:  An ECG dated 09/05/2021 was personally reviewed today and demonstrated:  Sinus rhythm with LVH, inferior Q waves.  Labwork: 11/29/2022: ALT 19; AST 31; Hemoglobin 11.9; Platelets 76 12/06/2022: BUN 12; Creat 1.07; Potassium 4.1; Sodium 131   Other Studies Reviewed Today:  No interval cardiac testing for review today.  Assessment and Plan:  1.  Chest pain.  Description atypical from an ischemic perspective and may in fact be more inflammatory in etiology.  Given description of cardiomegaly by his most recent chest CT we will update his echocardiogram to ensure no development of cardiomyopathy.  2.  Preprocedure cardiac evaluation.  Patient being considered for EGD and colonoscopy electively in October.  His periprocedural risk is intermediate based on comorbidities at this point, but this does not preclude him proceeding with endoscopy under monitored sedation.  We do plan to update his echocardiogram first as discussed above.  3.  Cirrhosis with portal hypertension, esophageal  varices, portal vein thrombosis previously on Eliquis, recurrent ascites, status post TIPS procedure in April, and turned down for liver transplant.  Disposition:  Follow up  test results.  Signed, Jonelle Sidle, M.D., F.A.C.C. [facility]

## 2023-01-10 NOTE — Patient Instructions (Signed)
Medication Instructions:   Your physician recommends that you continue on your current medications as directed. Please refer to the Current Medication list given to you today.   Labwork: None today  Testing/Procedures: Your physician has requested that you have an echocardiogram. Echocardiography is a painless test that uses sound waves to create images of your heart. It provides your doctor with information about the size and shape of your heart and how well your heart's chambers and valves are working. This procedure takes approximately one hour. There are no restrictions for this procedure. Please do NOT wear cologne, perfume, aftershave, or lotions (deodorant is allowed). Please arrive 15 minutes prior to your appointment time.   Follow-Up: We will call you with test results.  Any Other Special Instructions Will Be Listed Below (If Applicable).  If you need a refill on your cardiac medications before your next appointment, please call your pharmacy.

## 2023-01-11 ENCOUNTER — Ambulatory Visit (HOSPITAL_COMMUNITY): Payer: Medicaid Other | Admitting: Clinical

## 2023-01-16 ENCOUNTER — Other Ambulatory Visit: Payer: Self-pay | Admitting: Gastroenterology

## 2023-01-16 ENCOUNTER — Telehealth: Payer: Self-pay | Admitting: *Deleted

## 2023-01-16 DIAGNOSIS — R188 Other ascites: Secondary | ICD-10-CM

## 2023-01-16 DIAGNOSIS — K828 Other specified diseases of gallbladder: Secondary | ICD-10-CM

## 2023-01-16 NOTE — Telephone Encounter (Signed)
Spoke with patient. Reports having some oozing for 2-3 hours from his umbilical hernia. This has stopped and there is a scab in place. Recommended keeping the area clean and dry, covering with dry gauze until healed just for protection.   He reports his abdomen is more distended than it was when I saw him a couple weeks ago.  He would like to see about having a paracentesis.  Ask if he can have this done on Friday as he will already be over any pain to have an echocardiogram.  He is taking Lasix 40 mg twice daily and spironolactone 100 mg in the morning and reports he is actually taking extra 50 mg tablet in the evening.  Advised it we will need to repeat a BMP and could consider increasing his diuretics further if needed.  Offered office visit with me this week, but patient states he would only be able to come on Friday.  I currently have an 8 AM and 10:30 AM appointment, patient states he would not be able to be at the office before we closed on Friday.  We can try to arrange a follow-up next week.  Mindy/Tammy: Please arrange paracentesis on Friday, sometime after 11 AM if possible. Max 6 L Labs including body fluid cell count, Gram stain, culture Albumin per protocol   Patient plans to have BMP completed on Friday as well.  I am placing orders today.  Mandy: Please arrange follow-up with me next week.  Please touch base with patient to determine date and time that would be most suitable for him.

## 2023-01-16 NOTE — Telephone Encounter (Signed)
Pt called and states that his belly button was leaking last night and this morning it popped. He states that his stomach is not that swollen and he is not short of breath. He states he has not noticed any swelling in legs or feet. States his back is still hurting. Informed to go to ED if he starts to feel short of breath or notice any swelling.

## 2023-01-17 ENCOUNTER — Other Ambulatory Visit: Payer: Self-pay | Admitting: *Deleted

## 2023-01-17 DIAGNOSIS — R188 Other ascites: Secondary | ICD-10-CM

## 2023-01-17 NOTE — Telephone Encounter (Signed)
Pt informed that paracentesis is scheduled for 01/19/23. Pt will have MRI, lab work and then will have para. Pt verbalized understanding.

## 2023-01-17 NOTE — Progress Notes (Signed)
error 

## 2023-01-18 ENCOUNTER — Inpatient Hospital Stay: Payer: Medicaid Other | Attending: Hematology

## 2023-01-18 ENCOUNTER — Other Ambulatory Visit (HOSPITAL_COMMUNITY)
Admission: RE | Admit: 2023-01-18 | Discharge: 2023-01-18 | Disposition: A | Discharge status: Home / Self Care | Payer: Medicaid Other | Source: Ambulatory Visit | Attending: Gastroenterology | Admitting: Gastroenterology

## 2023-01-18 DIAGNOSIS — D509 Iron deficiency anemia, unspecified: Secondary | ICD-10-CM | POA: Insufficient documentation

## 2023-01-18 DIAGNOSIS — K7460 Unspecified cirrhosis of liver: Secondary | ICD-10-CM | POA: Diagnosis not present

## 2023-01-18 DIAGNOSIS — N182 Chronic kidney disease, stage 2 (mild): Secondary | ICD-10-CM | POA: Diagnosis not present

## 2023-01-18 DIAGNOSIS — D696 Thrombocytopenia, unspecified: Secondary | ICD-10-CM | POA: Diagnosis not present

## 2023-01-18 DIAGNOSIS — R188 Other ascites: Secondary | ICD-10-CM | POA: Insufficient documentation

## 2023-01-18 DIAGNOSIS — I81 Portal vein thrombosis: Secondary | ICD-10-CM | POA: Diagnosis not present

## 2023-01-18 DIAGNOSIS — I129 Hypertensive chronic kidney disease with stage 1 through stage 4 chronic kidney disease, or unspecified chronic kidney disease: Secondary | ICD-10-CM | POA: Insufficient documentation

## 2023-01-18 DIAGNOSIS — D500 Iron deficiency anemia secondary to blood loss (chronic): Secondary | ICD-10-CM

## 2023-01-18 LAB — CBC WITH DIFFERENTIAL/PLATELET
Abs Immature Granulocytes: 0.01 10*3/uL (ref 0.00–0.07)
Basophils Absolute: 0 10*3/uL (ref 0.0–0.1)
Basophils Relative: 0 %
Eosinophils Absolute: 0.2 10*3/uL (ref 0.0–0.5)
Eosinophils Relative: 3 %
HCT: 33.9 % — ABNORMAL LOW (ref 39.0–52.0)
Hemoglobin: 11.8 g/dL — ABNORMAL LOW (ref 13.0–17.0)
Immature Granulocytes: 0 %
Lymphocytes Relative: 10 %
Lymphs Abs: 0.5 10*3/uL — ABNORMAL LOW (ref 0.7–4.0)
MCH: 34.1 pg — ABNORMAL HIGH (ref 26.0–34.0)
MCHC: 34.8 g/dL (ref 30.0–36.0)
MCV: 98 fL (ref 80.0–100.0)
Monocytes Absolute: 0.5 10*3/uL (ref 0.1–1.0)
Monocytes Relative: 9 %
Neutro Abs: 4.1 10*3/uL (ref 1.7–7.7)
Neutrophils Relative %: 78 %
Platelets: 102 10*3/uL — ABNORMAL LOW (ref 150–400)
RBC: 3.46 MIL/uL — ABNORMAL LOW (ref 4.22–5.81)
RDW: 15.6 % — ABNORMAL HIGH (ref 11.5–15.5)
WBC: 5.3 10*3/uL (ref 4.0–10.5)
nRBC: 0 % (ref 0.0–0.2)

## 2023-01-18 LAB — COMPREHENSIVE METABOLIC PANEL
ALT: 32 U/L (ref 0–44)
AST: 49 U/L — ABNORMAL HIGH (ref 15–41)
Albumin: 3.1 g/dL — ABNORMAL LOW (ref 3.5–5.0)
Alkaline Phosphatase: 154 U/L — ABNORMAL HIGH (ref 38–126)
Anion gap: 8 (ref 5–15)
BUN: 14 mg/dL (ref 6–20)
CO2: 23 mmol/L (ref 22–32)
Calcium: 8.7 mg/dL — ABNORMAL LOW (ref 8.9–10.3)
Chloride: 99 mmol/L (ref 98–111)
Creatinine, Ser: 1.08 mg/dL (ref 0.61–1.24)
GFR, Estimated: >60 mL/min (ref >60)
Glucose, Bld: 94 mg/dL (ref 70–99)
Potassium: 4 mmol/L (ref 3.5–5.1)
Sodium: 130 mmol/L — ABNORMAL LOW (ref 135–145)
Total Bilirubin: 2.8 mg/dL — ABNORMAL HIGH (ref 0.3–1.2)
Total Protein: 6.8 g/dL (ref 6.5–8.1)

## 2023-01-18 LAB — IRON AND TIBC
Iron: 110 ug/dL (ref 45–182)
Saturation Ratios: 38 % (ref 17.9–39.5)
TIBC: 288 ug/dL (ref 250–450)
UIBC: 178 ug/dL

## 2023-01-18 LAB — BASIC METABOLIC PANEL
Anion gap: 8 (ref 5–15)
BUN: 14 mg/dL (ref 6–20)
CO2: 22 mmol/L (ref 22–32)
Calcium: 8.7 mg/dL — ABNORMAL LOW (ref 8.9–10.3)
Chloride: 100 mmol/L (ref 98–111)
Creatinine, Ser: 1.04 mg/dL (ref 0.61–1.24)
GFR, Estimated: >60 mL/min (ref >60)
Glucose, Bld: 96 mg/dL (ref 70–99)
Potassium: 3.9 mmol/L (ref 3.5–5.1)
Sodium: 130 mmol/L — ABNORMAL LOW (ref 135–145)

## 2023-01-18 LAB — FERRITIN: Ferritin: 66 ng/mL (ref 24–336)

## 2023-01-19 ENCOUNTER — Ambulatory Visit (HOSPITAL_COMMUNITY)
Admission: RE | Admit: 2023-01-19 | Discharge: 2023-01-19 | Disposition: A | Discharge status: Home / Self Care | Payer: Medicaid Other | Source: Ambulatory Visit | Attending: Gastroenterology | Admitting: Gastroenterology

## 2023-01-19 ENCOUNTER — Other Ambulatory Visit: Payer: Self-pay | Admitting: Gastroenterology

## 2023-01-19 ENCOUNTER — Inpatient Hospital Stay: Payer: Medicaid Other

## 2023-01-19 ENCOUNTER — Ambulatory Visit: Payer: Medicaid Other | Admitting: Pulmonary Disease

## 2023-01-19 DIAGNOSIS — K7460 Unspecified cirrhosis of liver: Secondary | ICD-10-CM | POA: Diagnosis not present

## 2023-01-19 DIAGNOSIS — R188 Other ascites: Secondary | ICD-10-CM | POA: Insufficient documentation

## 2023-01-19 MED ORDER — GADOBUTROL 1 MMOL/ML IV SOLN
10.0000 mL | Freq: Once | INTRAVENOUS | Status: AC | PRN
  Administered 2023-01-19: 10 mL via INTRAVENOUS

## 2023-01-20 ENCOUNTER — Ambulatory Visit (HOSPITAL_COMMUNITY): Admission: RE | Admit: 2023-01-20 | Payer: Medicaid Other | Source: Ambulatory Visit

## 2023-01-25 ENCOUNTER — Inpatient Hospital Stay (HOSPITAL_BASED_OUTPATIENT_CLINIC_OR_DEPARTMENT_OTHER): Payer: Medicaid Other | Admitting: Oncology

## 2023-01-25 ENCOUNTER — Other Ambulatory Visit: Payer: Self-pay | Admitting: Interventional Radiology

## 2023-01-25 DIAGNOSIS — D696 Thrombocytopenia, unspecified: Secondary | ICD-10-CM | POA: Diagnosis not present

## 2023-01-25 DIAGNOSIS — D500 Iron deficiency anemia secondary to blood loss (chronic): Secondary | ICD-10-CM

## 2023-01-25 DIAGNOSIS — K7460 Unspecified cirrhosis of liver: Secondary | ICD-10-CM

## 2023-01-25 NOTE — Progress Notes (Deleted)
Referring Provider: Reather Converse, PA-C Primary Care Physician:  Reather Converse, PA-C Primary GI Physician: Dr. Jena Gauss  No chief complaint on file.   HPI:   Alejandro Porter is a 47 y.o. male presenting today for follow-up of cirrhosis.   He has hstory of cirrhosis complicated by portal hypertension, esophageal varices, portal vein thrombosis previously on Eliquis (discontinued 09/2022), hepatic encephalopathy on lactulose and Xfiaxan, ascites requiring repeat paras, history of SBP in April 2022 , liver lesions noted on MRI November 2023.  He developed worsening ascites requiring increasingly frequent paracentesis with inability to titrate diuretics due to worsening renal funtion, hyponatremia, and ultimately underwent TIPS procedure 09/04/22. He was undergoing liver transplant eval at [facility], but this was closed as it was felt he was not a good candidate secondary to the lack of support.    Also with history of biliary dyskinesia but not a surgical candidate, GERD, IDA followed by Hematology.   Last seen in the office 01/01/23.   Reported ongoing back pain that had started after a fall and had not followed up with PCP on this. Still with chest pain and had not seen cardiology. He had no ascites or peripheral edema on Lasix 20 mg BID and spironolactone 100 mg daily. No HE, but still forgetting to take lactulose at times and occasionally skipping a day without a BM. Chronic abdominal pain more tolerable with dicyclomine daily. Nausea under control but taking 3 Zoftan every morning. No GERD on pantoprazole 40 mg daily. Plan included increase pantoprazole to 40 mg BID to see if this would help nausea, only take 1 zofran every 8 hours as needed, continue dicyclomine daily, continue cirrhosis medications, arrange MRI liver protocol, see cardiology for chest pain, see PCP for back pain  Patient called 8/13 reporting that his umbilical hernia had "popped" and that it is leaking.  Patient had  a same-day and drainage had stopped.  Stated there was a scab in place.  He was advised to keep the area clean and dry, cover with a dry gauze until healed.  He also reported that his abdomen was more distended.  He was offered an office visit the same week, but he could not come in.  He is scheduled for paracentesis and BMP to see if diuretics can be adjusted.  Ultimately, he did not have enough ascites for paracentesis. BMP completed 8/15 with sodium stable at 130, potassium and creatinine within normal limits.  Recommended continuing current diuretic regimen.   MRI abdomen w/w/o contrast 01/19/23  Exam significantly limited by breath motion artifact.  No arterially hyperenhancing lesions identified in the liver. Previously described LI-RADS 3 lesions in the right lobe of the liver and adjacent to the intrahepatic IVC are not clearly appreciated on today's examination. Right hepatic vein TIPS, patency not well assessed by MR due to susceptibility artifact. Small volume perihepatic ascites.  He did have an office visit with cardiology 01/10/2023.   Chest pain was felt to be atypical and may in fact be more inflammatory in etiology.  Recommended updating echocardiogram due to cardiomegaly on recent chest CT.  No showed for echo scheduled for 8/17.   Today:    MELD 3.0  13 on 11/29/22 Imaging: Most recent imaging includes CT angio A/P with and without contrast 11/10/2022 with no arterially enhancing lesions to suggest underlying HCC.  AFP: 2.06 March 2022.  Hep A/B vaccination: Complete.  EGD: Oct 2023; small esophageal varices, portal gastropathy, multiple gastric polyps s/p resection and retrieval (  hyperplastic) duodenitis. Recommended 1 year surveillance.  BB: No Ascites/peripheral edema:  History of SBP: Yes. On Cipro daily.  Encephalopathy:     Past Medical History:  Diagnosis Date   Anxiety 08/2022   CKD (chronic kidney disease) stage 2, GFR 60-89 ml/min 05/24/2022   stage 3a    Depression 06/2022   GERD (gastroesophageal reflux disease)    H/O colonoscopy    H/O endoscopy    Headache    Hypertension    Liver cirrhosis (HCC)    Neuropathy    OSA (obstructive sleep apnea)    not currently using CPAP   S/P abdominal paracentesis     Past Surgical History:  Procedure Laterality Date   BIOPSY  09/12/2019   Procedure: BIOPSY;  Surgeon: Corbin Ade, MD;  [facility];  Service: Endoscopy;;   COLONOSCOPY N/A 09/18/2019   Dr. Darrick Penna: 12 colon polyps removed, multiple simple adenomas and some inflammatory polyps, diverticulosis, external and internal hemorrhoids.  Next colonoscopy in 3 years.   COLONOSCOPY WITH PROPOFOL N/A 03/24/2022   Procedure: COLONOSCOPY WITH PROPOFOL;  Surgeon: Lanelle Bal, DO;  [facility];  Service: Endoscopy;  Laterality: N/A;  1:30pm, asa 3   ESOPHAGEAL BANDING N/A 09/12/2019   Procedure: ESOPHAGEAL BANDING;  Surgeon: Corbin Ade, MD;  [facility];  Service: Endoscopy;  Laterality: N/A;   ESOPHAGOGASTRODUODENOSCOPY N/A 09/12/2019   Dr. Jena Gauss: Mild erosive reflux esophagitis.  Schatzki ring status post dilation.  Small grade 1/grade 2 esophageal varices, portal hypertensive gastropathy, hyperplastic gastric polyp, gastric erosions with chronic inactive gastritis on biopsies, no H. pylori.   ESOPHAGOGASTRODUODENOSCOPY (EGD) WITH PROPOFOL N/A 12/16/2020   two short columns of no more than Grade 2 esophageal varices, appearing innocent. Grade 1 varices not apparent today. Multiple large posterior body and antral hyperplastic, hemorrhagic appearing polyps, larges approximately 2.5 cm pedunculated. Portal gastropathy. Normal duodenum. Polypectomy with clips placed. EGD in 18 months.   ESOPHAGOGASTRODUODENOSCOPY (EGD) WITH PROPOFOL N/A 03/24/2022   Procedure: ESOPHAGOGASTRODUODENOSCOPY (EGD) WITH PROPOFOL;  Surgeon: Lanelle Bal, DO;  [facility];  Service: Endoscopy;  Laterality: N/A;    IR ANGIOGRAM SELECTIVE EACH ADDITIONAL VESSEL  09/04/2022   IR EMBO VENOUS NOT HEMORR HEMANG  INC GUIDE ROADMAPPING  09/04/2022   IR INTRAVASCULAR ULTRASOUND NON CORONARY  09/04/2022   IR PARACENTESIS  09/04/2022   IR RADIOLOGIST EVAL & MGMT  07/19/2022   IR RADIOLOGIST EVAL & MGMT  08/14/2022   IR RADIOLOGIST EVAL & MGMT  09/29/2022   IR RADIOLOGIST EVAL & MGMT  10/31/2022   IR RADIOLOGIST EVAL & MGMT  11/20/2022   IR TIPS  09/04/2022   IR US GUIDE VASC ACCESS RIGHT  09/04/2022   POLYPECTOMY  09/18/2019   Procedure: POLYPECTOMY;  Surgeon: West Bali, MD;  [facility];  Service: Endoscopy;;   POLYPECTOMY  12/16/2020   Procedure: POLYPECTOMY;  Surgeon: Corbin Ade, MD;  [facility];  Service: Endoscopy;;  gastric   POLYPECTOMY  03/24/2022   Procedure: POLYPECTOMY;  Surgeon: Lanelle Bal, DO;  [facility];  Service: Endoscopy;;   RADIOLOGY WITH ANESTHESIA N/A 09/04/2022   Procedure: TIPS;  Surgeon: Bennie Dallas, MD;  Location: [facility];  Service: Radiology;  Laterality: N/A;    Current Outpatient Medications  Medication Sig Dispense Refill   Cholecalciferol (VITAMIN D3) 125 MCG (5000 UT) TABS Take 5,000 Units by mouth in the morning.     ciprofloxacin (  CIPRO) 500 MG tablet TAKE 1 TABLET BY MOUTH DAILY 30 tablet 5   cyclobenzaprine (FLEXERIL) 10 MG tablet Take 10 mg by mouth 3 (three) times daily as needed for muscle spasms.     dicyclomine (BENTYL) 10 MG capsule TAKE 1 CAPSULE BY MOUTH DAILY BEFORE BREAKFAST 30 capsule 1   DULoxetine (CYMBALTA) 60 MG capsule Take 120 mg by mouth daily.     FEROSUL 325 (65 Fe) MG tablet Take 325 mg by mouth 2 (two) times daily.     furosemide (LASIX) 40 MG tablet Take 20 mg by mouth 2 (two) times daily.     gabapentin (NEURONTIN) 300 MG capsule Take 300 mg by mouth 3 (three) times daily as needed (pain.).     lactulose (CHRONULAC) 10 GM/15ML solution Take 60 mLs (40 g total) by mouth 3 (three) times daily. Titrate to goal  of 3-4 BMs daily. 946 mL 11   levOCARNitine (CARNITOR) 330 MG tablet Take 330 mg by mouth 3 (three) times daily.     Multiple Vitamin (MULTIVITAMIN) tablet Take 1 tablet by mouth in the morning.     ondansetron (ZOFRAN) 4 MG tablet TAKE 1 TABLET BY MOUTH THREE TIMES DAILY BEFORE MEALS 90 tablet 3   pantoprazole (PROTONIX) 40 MG tablet Take 1 tablet (40 mg total) by mouth 2 (two) times daily before a meal. 60 tablet 5   spironolactone (ALDACTONE) 100 MG tablet Take 100 mg by mouth daily.     XIFAXAN 550 MG TABS tablet TAKE 1 TABLET BY MOUTH TWICE DAILY 180 tablet 3   Zinc 50 MG TABS Take 50 mg by mouth in the morning.     No current facility-administered medications for this visit.    Allergies as of 01/26/2023 - Review Complete 01/25/2023  Allergen Reaction Noted   Chlorthalidone  11/04/2019   Metoprolol  11/04/2019    Family History  Problem Relation Age of Onset   Brain cancer Mother    Diabetes Mother    Diabetes Father    Pulmonary embolism Brother    Liver disease Neg Hx     Social History   Socioeconomic History   Marital status: Single    Spouse name: Not on file   Number of children: Not on file   Years of education: Not on file   Highest education level: Not on file  Occupational History   Occupation: umemployed  Tobacco Use   Smoking status: Never    Passive exposure: Current   Smokeless tobacco: Never  Vaping Use   Vaping status: Never Used  Substance and Sexual Activity   Alcohol use: Not Currently   Drug use: Never   Sexual activity: Not Currently  Other Topics Concern   Not on file  Social History Narrative   Not on file   Social Determinants of Health   Financial Resource Strain: Not on file  Food Insecurity: Low Risk  (05/22/2022)   [facility]   Food vital sign    Within the past 12 months, you worried that your food would run out before you got money to buy more: Never true    Within the past 12 months, the  food you bought just didn't last and you didn't have money to get more. : Never true  Transportation Needs: Not on file (05/22/2022)  Physical Activity: Not on file  Stress: Not on file  Social Connections: Not on file    Review of Systems: Gen: Denies fever, chills, anorexia.  Denies fatigue, weakness, weight loss.  CV: Denies chest pain, palpitations, syncope, peripheral edema, and claudication. Resp: Denies dyspnea at rest, cough, wheezing, coughing up blood, and pleurisy. GI: Denies vomiting blood, jaundice, and fecal incontinence.   Denies dysphagia or odynophagia. Derm: Denies rash, itching, dry skin Psych: Denies depression, anxiety, memory loss, confusion. No homicidal or suicidal ideation.  Heme: Denies bruising, bleeding, and enlarged lymph nodes.  Physical Exam: There were no vitals taken for this visit. General:   Alert and oriented. No distress noted. Pleasant and cooperative.  Head:  Normocephalic and atraumatic. Eyes:  Conjuctiva clear without scleral icterus. Heart:  S1, S2 present without murmurs appreciated. Lungs:  Clear to auscultation bilaterally. No wheezes, rales, or rhonchi. No distress.  Abdomen:  +BS, soft, non-tender and non-distended. No rebound or guarding. No HSM or masses noted. Msk:  Symmetrical without gross deformities. Normal posture. Extremities:  Without edema. Neurologic:  Alert and  oriented x4 Psych:  Normal mood and affect.    Assessment:     Plan:  ***   [name], PA-C [facility] 01/26/2023

## 2023-01-25 NOTE — Progress Notes (Signed)
[facility] [address], [place] [address]   CLINIC:  Medical Oncology/Hematology  PCP:  Reather Converse, PA-C [id number] [place] [address] [phone]   REASON FOR VISIT:  Follow-up for iron deficiency anemia, thrombocytopenia (cirrhosis), and portal vein thrombosis   CURRENT THERAPY: Intermittent IV iron, Eliquis  I connected with Alejandro Porter on 01/25/23 at  9:30 AM EDT by telephone visit and verified that I am speaking with the correct person using two identifiers.   I discussed the limitations, risks, security and privacy concerns of performing an evaluation and management service by telemedicine and the availability of in-person appointments. I also discussed with the patient that there may be a patient responsible charge related to this service. The patient expressed understanding and agreed to proceed.   Other persons participating in the visit and their role in the encounter: NP, Patient    Patient's location: Home  Provider's location: Clinic    INTERVAL HISTORY:   Alejandro Porter 47 y.o. male returns for routine follow-up of iron deficiency anemia, thrombocytopenia, and portal vein thrombosis.  He was last seen by Rojelio Brenner PA-C on 09/21/2022.  He received 2 doses of IV Feraheme on 09/25/2022 and 10/12/2022.   In the interim, he has been having chest pain, weight gain, decrease in his appetite and increasing fatigue over the past 3 months or so.  He has been seen by gastroenterology, cardiology and portal hypertension clinic.  He is status post TIPS creation back in April 2024.  He gets periodic paracentesis but the last few ultrasounds have not revealed enough fluid for removal.  He is currently not on anticoagulation.  Was worked up by cardiology for chest pain on 01/10/2023 and etiology thought to be secondary to inflammatory versus development of cardiomegaly.  He had an echocardiogram on 01/10/2023.  Results are  pending.  He is a candidate for a liver transplant but unfortunately has not been placed on the list due to social situations and no care at home.  He is currently on lactulose and rifaximin.  He denies any bleeding per rectum.  Feels like his energy levels are low and he could sleep all the time.  Has trouble sleeping at bedtime.  Takes gabapentin and muscle relaxers which are only temporarily helpful.  Feels like he is gaining weight.  His father is dying of cirrhosis which is causing an increase in his depression and anxiety. He admits to easy bruising but denies petechial rash.  He denies any B symptoms such as fever, chills, night sweats, unintentional weight loss.    No pica, restless legs, chest pain, dyspnea on exertion, lightheadedness, or syncope.   He has 50% energy and 40% appetite.   ASSESSMENT & PLAN:  1.  Iron deficiency anemia + anemia of CKD - He previously took iron pills which caused constipation. - Most recent IV Venofer 300 mg x 3 from 01/19/2022 through 02/03/2022 - EGD (03/24/2022): Small esophageal varices, portal hypertensive gastropathy, multiple gastric polyps, duodenitis. - Colonoscopy (03/24/2022): Nonbleeding internal hemorrhoids.  Rectal varices. - No bright red blood per rectum.  No overt melena. - Reports increased fatigue - Most recent labs from 01/18/2023 show hemoglobin of 11.8, iron saturation 38% and ferritin of 66.   - PLAN:  -Recommend 3 doses of 400 mg IV Venofer.  -Previously received IV Feraheme but apparently unable to undergo imaging until 1 month after IV Feraheme so will switch.  -Return to clinic in 3 to  4 months for follow-up with labs.    2.  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 - SPEP was negative.  B12, methylmalonic acid, folic acid were normal.  Hepatitis B  was negative.  H. pylori was negative. - Abdominal ultrasound (06/21/2021): Liver cirrhosis and  splenomegaly (estimated splenic volume 1100 mL) - Abdominal ultrasound (11/29/2021): Interval increase in splenomegaly measuring 20.6 cm in length with estimated volume 1943 cc - TIPS procedure performed 09/04/2022 -Patient denies any major bleeding, bruising, petechial rash - Thrombocytopenia likely secondary to cirrhosis and splenomegaly. - Most recent labs from 01/18/2023 show a platelet count of 102,000 and an absolute lymphocyte count of 0.5. - PLAN: No indication for treatment at this time.  We will continue to monitor.     3.  Portal vein thrombosis  - Ultrasound Doppler of the liver on 09/11/2019 showed nonocclusive hyperechoic thrombus along the main portal vein wall posteriorly extending into the right portal vein. - Patient had stopped Eliquis in August 2023, reportedly at the instruction of his "liver doctor," but this may have been a misunderstanding on his part. - Patient has resumed his Eliquis as of November 2023.  At his last visit, he had told me that his "liver doctor" stopped his Eliquis on 01/04/2022.  However, his gastroenterology NP [name]) reached out to me in November 2023 to inform me that he was noted to have recurrent nonocclusive portal vein thrombosis.  I had recommended resuming Eliquis, which was done per GI on 04/20/2022. - TIPS procedure performed 09/04/2022. - Eliquis was discontinued by interventional radiologist following TIPS procedure.   4.  Liver cirrhosis and ascites - [facility]17[facility]2019 showed liver morphology consistent with cirrhosis with no masses.  Enlarged spleen measuring 22 cm.  Nonocclusive thrombus in the portal vein.  Prominent to mildly enlarged periceliac and gastrohepatic ligament lymph nodes.  3.5 cm left adrenal mass consistent with adenoma. - Abdominal ultrasound (06/21/2021): Liver cirrhosis and splenomegaly (estimated splenic volume 1100 mL) - He follows with gastroenterology and hepatology - TIPS procedure  09/04/2022  PLAN SUMMARY: >> IV Venofer 400 mg X 3.  >> Labs in 4 months = CBC/D, CMP, ferritin, iron/TIBC >> OFFICE visit in 4 months     REVIEW OF SYSTEMS:   Review of Systems  Constitutional:  Positive for fatigue.  Respiratory:  Positive for shortness of breath. Negative for cough and hemoptysis.   Gastrointestinal:  Positive for abdominal distention. Negative for blood in stool.  Musculoskeletal:  Positive for arthralgias and myalgias.  Neurological:  Positive for headaches.  Psychiatric/Behavioral:  Positive for sleep disturbance.      PHYSICAL EXAM:  ECOG PERFORMANCE STATUS: 1 - Symptomatic but completely ambulatory  There were no vitals filed for this visit. There were no vitals filed for this visit. Physical Exam Neurological:     Mental Status: He is alert and oriented to person, place, and time.     PAST MEDICAL/SURGICAL HISTORY:  Past Medical History:  Diagnosis Date   Anxiety 08/2022   CKD (chronic kidney disease) stage 2, GFR 60-89 ml/min 05/24/2022   stage 3a   Depression 06/2022   GERD (gastroesophageal reflux disease)    H/O colonoscopy    H/O endoscopy    Headache    Hypertension    Liver cirrhosis (HCC)    Neuropathy    OSA (obstructive sleep apnea)    not currently using CPAP   S/P  abdominal paracentesis    Past Surgical History:  Procedure Laterality Date   BIOPSY  09/12/2019   Procedure: BIOPSY;  Surgeon: Corbin Ade, MD;  [facility];  Service: Endoscopy;;   COLONOSCOPY N/A 09/18/2019   Dr. Darrick Penna: 12 colon polyps removed, multiple simple adenomas and some inflammatory polyps, diverticulosis, external and internal hemorrhoids.  Next colonoscopy in 3 years.   COLONOSCOPY WITH PROPOFOL N/A 03/24/2022   Procedure: COLONOSCOPY WITH PROPOFOL;  Surgeon: Lanelle Bal, DO;  [facility];  Service: Endoscopy;  Laterality: N/A;  1:30pm, asa 3   ESOPHAGEAL BANDING N/A 09/12/2019   Procedure: ESOPHAGEAL BANDING;   Surgeon: Corbin Ade, MD;  [facility];  Service: Endoscopy;  Laterality: N/A;   ESOPHAGOGASTRODUODENOSCOPY N/A 09/12/2019   Dr. Jena Gauss: Mild erosive reflux esophagitis.  Schatzki ring status post dilation.  Small grade 1/grade 2 esophageal varices, portal hypertensive gastropathy, hyperplastic gastric polyp, gastric erosions with chronic inactive gastritis on biopsies, no H. pylori.   ESOPHAGOGASTRODUODENOSCOPY (EGD) WITH PROPOFOL N/A 12/16/2020   two short columns of no more than Grade 2 esophageal varices, appearing innocent. Grade 1 varices not apparent today. Multiple large posterior body and antral hyperplastic, hemorrhagic appearing polyps, larges approximately 2.5 cm pedunculated. Portal gastropathy. Normal duodenum. Polypectomy with clips placed. EGD in 18 months.   ESOPHAGOGASTRODUODENOSCOPY (EGD) WITH PROPOFOL N/A 03/24/2022   Procedure: ESOPHAGOGASTRODUODENOSCOPY (EGD) WITH PROPOFOL;  Surgeon: Lanelle Bal, DO;  [facility];  Service: Endoscopy;  Laterality: N/A;   IR ANGIOGRAM SELECTIVE EACH ADDITIONAL VESSEL  09/04/2022   IR EMBO VENOUS NOT HEMORR HEMANG  INC GUIDE ROADMAPPING  09/04/2022   IR INTRAVASCULAR ULTRASOUND NON CORONARY  09/04/2022   IR PARACENTESIS  09/04/2022   IR RADIOLOGIST EVAL & MGMT  07/19/2022   IR RADIOLOGIST EVAL & MGMT  08/14/2022   IR RADIOLOGIST EVAL & MGMT  09/29/2022   IR RADIOLOGIST EVAL & MGMT  10/31/2022   IR RADIOLOGIST EVAL & MGMT  11/20/2022   IR TIPS  09/04/2022   IR US GUIDE VASC ACCESS RIGHT  09/04/2022   POLYPECTOMY  09/18/2019   Procedure: POLYPECTOMY;  Surgeon: West Bali, MD;  [facility];  Service: Endoscopy;;   POLYPECTOMY  12/16/2020   Procedure: POLYPECTOMY;  Surgeon: Corbin Ade, MD;  [facility];  Service: Endoscopy;;  gastric   POLYPECTOMY  03/24/2022   Procedure: POLYPECTOMY;  Surgeon: Lanelle Bal, DO;  [facility];  Service: Endoscopy;;   RADIOLOGY WITH ANESTHESIA  N/A 09/04/2022   Procedure: TIPS;  Surgeon: Bennie Dallas, MD;  Location: [facility];  Service: Radiology;  Laterality: N/A;    SOCIAL HISTORY:  Social History   Socioeconomic History   Marital status: Single    Spouse name: Not on file   Number of children: Not on file   Years of education: Not on file   Highest education level: Not on file  Occupational History   Occupation: umemployed  Tobacco Use   Smoking status: Never    Passive exposure: Current   Smokeless tobacco: Never  Vaping Use   Vaping status: Never Used  Substance and Sexual Activity   Alcohol use: Not Currently   Drug use: Never   Sexual activity: Not Currently  Other Topics Concern   Not on file  Social History Narrative   Not on file   Social Determinants of Health   Financial Resource Strain: Not on file  Food Insecurity:  Low Risk  (05/22/2022)   [facility]   Food vital sign    Within the past 12 months, you worried that your food would run out before you got money to buy more: Never true    Within the past 12 months, the food you bought just didn't last and you didn't have money to get more. : Never true  Transportation Needs: Not on file (05/22/2022)  Physical Activity: Not on file  Stress: Not on file  Social Connections: Not on file  Intimate Partner Violence: Not on file    FAMILY HISTORY:  Family History  Problem Relation Age of Onset   Brain cancer Mother    Diabetes Mother    Diabetes Father    Pulmonary embolism Brother    Liver disease Neg Hx     CURRENT MEDICATIONS:  Outpatient Encounter Medications as of 01/25/2023  Medication Sig   Cholecalciferol (VITAMIN D3) 125 MCG (5000 UT) TABS Take 5,000 Units by mouth in the morning.   ciprofloxacin (CIPRO) 500 MG tablet TAKE 1 TABLET BY MOUTH DAILY   cyclobenzaprine (FLEXERIL) 10 MG tablet Take 10 mg by mouth 3 (three) times daily as needed for muscle spasms.   dicyclomine (BENTYL) 10 MG capsule TAKE 1  CAPSULE BY MOUTH DAILY BEFORE BREAKFAST   DULoxetine (CYMBALTA) 60 MG capsule Take 120 mg by mouth daily.   FEROSUL 325 (65 Fe) MG tablet Take 325 mg by mouth 2 (two) times daily.   furosemide (LASIX) 40 MG tablet Take 20 mg by mouth 2 (two) times daily.   gabapentin (NEURONTIN) 300 MG capsule Take 300 mg by mouth 3 (three) times daily as needed (pain.).   lactulose (CHRONULAC) 10 GM/15ML solution Take 60 mLs (40 g total) by mouth 3 (three) times daily. Titrate to goal of 3-4 BMs daily.   levOCARNitine (CARNITOR) 330 MG tablet Take 330 mg by mouth 3 (three) times daily.   Multiple Vitamin (MULTIVITAMIN) tablet Take 1 tablet by mouth in the morning.   ondansetron (ZOFRAN) 4 MG tablet TAKE 1 TABLET BY MOUTH THREE TIMES DAILY BEFORE MEALS   pantoprazole (PROTONIX) 40 MG tablet Take 1 tablet (40 mg total) by mouth 2 (two) times daily before a meal.   spironolactone (ALDACTONE) 100 MG tablet Take 100 mg by mouth daily.   XIFAXAN 550 MG TABS tablet TAKE 1 TABLET BY MOUTH TWICE DAILY   Zinc 50 MG TABS Take 50 mg by mouth in the morning.   No facility-administered encounter medications on file as of 01/25/2023.    ALLERGIES:  Allergies  Allergen Reactions   Chlorthalidone     Severe headaches    Metoprolol     Severe headaches     LABORATORY DATA:  I have reviewed the labs as listed.  CBC    Component Value Date/Time   WBC 5.3 01/18/2023 0946   RBC 3.46 (L) 01/18/2023 0946   HGB 11.8 (L) 01/18/2023 0946   HCT 33.9 (L) 01/18/2023 0946   PLT 102 (L) 01/18/2023 0946   MCV 98.0 01/18/2023 0946   MCH 34.1 (H) 01/18/2023 0946   MCHC 34.8 01/18/2023 0946   RDW 15.6 (H) 01/18/2023 0946   LYMPHSABS 0.5 (L) 01/18/2023 0946   MONOABS 0.5 01/18/2023 0946   EOSABS 0.2 01/18/2023 0946   BASOSABS 0.0 01/18/2023 0946      Latest Ref Rng & Units 01/18/2023    9:46 AM 01/18/2023    9:35 AM 12/06/2022    1:38 PM  CMP  Glucose 70 - 99 mg/dL 94  96  88   BUN 6 - 20 mg/dL 14  14  12    Creatinine  0.61 - 1.24 mg/dL 1.61  0.96  0.45   Sodium 135 - 145 mmol/L 130  130  131   Potassium 3.5 - 5.1 mmol/L 4.0  3.9  4.1   Chloride 98 - 111 mmol/L 99  100  100   CO2 22 - 32 mmol/L 23  22  25    Calcium 8.9 - 10.3 mg/dL 8.7  8.7  8.8   Total Protein 6.5 - 8.1 g/dL 6.8     Total Bilirubin 0.3 - 1.2 mg/dL 2.8     Alkaline Phos 38 - 126 U/L 154     AST 15 - 41 U/L 49     ALT 0 - 44 U/L 32       DIAGNOSTIC IMAGING:  I have independently reviewed the relevant imaging and discussed with the patient.   WRAP UP:  All questions were answered. The patient knows to call the clinic with any problems, questions or concerns.  Medical decision making: Moderate  Time spent on visit:  I provided 22 minutes of non face-to-face telephone visit time during this encounter, and > 50% was spent counseling as documented under my assessment & plan.   Mauro Kaufmann, NP  01/25/23 9:29 AM

## 2023-01-26 ENCOUNTER — Ambulatory Visit: Payer: Medicaid Other | Admitting: Gastroenterology

## 2023-02-02 ENCOUNTER — Inpatient Hospital Stay: Payer: Medicaid Other

## 2023-02-02 VITALS — BP 119/78 | HR 83 | Temp 97.4°F | Resp 18 | Ht 68.0 in | Wt 231.7 lb

## 2023-02-02 DIAGNOSIS — D509 Iron deficiency anemia, unspecified: Secondary | ICD-10-CM | POA: Diagnosis not present

## 2023-02-02 DIAGNOSIS — D500 Iron deficiency anemia secondary to blood loss (chronic): Secondary | ICD-10-CM

## 2023-02-02 MED ORDER — SODIUM CHLORIDE 0.9 % IV SOLN: Freq: Once | INTRAVENOUS | Status: AC

## 2023-02-02 MED ORDER — SODIUM CHLORIDE 0.9 % IV SOLN
400.0000 mg | Freq: Once | INTRAVENOUS | Status: AC
  Administered 2023-02-02: 400 mg via INTRAVENOUS
  Filled 2023-02-02: qty 400

## 2023-02-02 NOTE — Patient Instructions (Signed)
 [facility]  Discharge Instructions: Thank you for choosing [facility] to provide your oncology and hematology care.  If you have a lab appointment with the Cancer Center - please note that after April 8th, 2024, all labs will be drawn in the cancer center.  You do not have to check in or register with the main entrance as you have in the past but will complete your check-in in the cancer center.  Wear comfortable clothing and clothing appropriate for easy access to any Portacath or PICC line.   We strive to give you quality time with your provider. You may need to reschedule your appointment if you arrive late (15 or more minutes).  Arriving late affects you and other patients whose appointments are after yours.  Also, if you miss three or more appointments without notifying the office, you may be dismissed from the clinic at the provider's discretion.      For prescription refill requests, have your pharmacy contact our office and allow 72 hours for refills to be completed.    Today you received the Venofer IV iron infusion.     BELOW ARE SYMPTOMS THAT SHOULD BE REPORTED IMMEDIATELY: *FEVER GREATER THAN 100.4 F (38 C) OR HIGHER *CHILLS OR SWEATING *NAUSEA AND VOMITING THAT IS NOT CONTROLLED WITH YOUR NAUSEA MEDICATION *UNUSUAL SHORTNESS OF BREATH *UNUSUAL BRUISING OR BLEEDING *URINARY PROBLEMS (pain or burning when urinating, or frequent urination) *BOWEL PROBLEMS (unusual diarrhea, constipation, pain near the anus) TENDERNESS IN MOUTH AND THROAT WITH OR WITHOUT PRESENCE OF ULCERS (sore throat, sores in mouth, or a toothache) UNUSUAL RASH, SWELLING OR PAIN  UNUSUAL VAGINAL DISCHARGE OR ITCHING   Items with * indicate a potential emergency and should be followed up as soon as possible or go to the Emergency Department if any problems should occur.  Please show the CHEMOTHERAPY ALERT CARD or IMMUNOTHERAPY ALERT CARD at check-in to the Emergency  Department and triage nurse.  Should you have questions after your visit or need to cancel or reschedule your appointment, please contact [facility] [phone]  and follow the prompts.  Office hours are 8:00 a.m. to 4:30 p.m. Monday - Friday. Please note that voicemails left after 4:00 p.m. may not be returned until the following business day.  We are closed weekends and major holidays. You have access to a nurse at all times for urgent questions. Please call the main number to the clinic [phone] and follow the prompts.  For any non-urgent questions, you may also contact your provider using MyChart. We now offer e-Visits for anyone 27 and older to request care online for non-urgent symptoms. For details visit [email or web address].   Also download the MyChart app! Go to the app store, search "MyChart", open the app, select [place], and log in with your MyChart username and password.

## 2023-02-02 NOTE — Progress Notes (Signed)
Patient presents today for iron infusion. Patient is in satisfactory condition with no new complaints voiced.  Vital signs are stable.  We will proceed with infusion per provider orders.    Peripheral IV started with good blood return pre and post infusion.  Venofer 400 mg given today per MD orders. Tolerated infusion without adverse affects. Vital signs stable. No complaints at this time. Discharged from clinic ambulatory in stable condition. Alert and oriented x 3. F/U with [facility] as scheduled.

## 2023-02-08 MED FILL — Iron Sucrose Inj 20 MG/ML (Fe Equiv): INTRAVENOUS | Qty: 20 | Status: AC

## 2023-02-09 ENCOUNTER — Inpatient Hospital Stay: Payer: Medicaid Other | Attending: Hematology

## 2023-02-12 NOTE — Progress Notes (Shared)
Reason for follow up: Patient presents via virtual telephone visit today for follow up after TIPS creation 09/04/22   Referring Physician(s): Earnest Bailey, DO, [name], NP, [name], PA-C, Annamarie Major, NP  History of present illness: HPI from last visit 11/20/22: Alejandro Porter is a 47 y.o. male  with a medical history significant for HTN, chronic kidney disease stage 3a, OSA and metabolic dysfunction-associated steatohepatitis with portal hypertension, esophageal varices, portal hypertensive gastropathy and recurrent ascites. He also has a non-occlusive chronic main portal vein thrombus and a history of encephalopathy. He has been evaluated for possible liver transplant but was considered a poor candidate due to social factors. The patient was referred to Interventional Radiology to discuss portal hypertension treatment/management options and we first met 07/19/22 via tele-health visit. He is now status post TIPS creation, paracentesis, and paraumbilical vein coil embolization on 09/04/22.  He has not required paracentesis since the procedure. His post-TIPS follow up appointment was 09/29/22 and he had complaints of very low energy and appetite levels, nausea, and pain radiating across his chest which he was treating with gabapentin. He had been compliant with lactulose and rifaximin and was without any encephalopathic symptoms.   He followed up with me 10/31/22 and had numerous complaints including shoulder, chest and abdominal pain, lack of appetite and occasional mild encephalopathy.  I ordered a multiphase CTA abdomen/pelvis to assess for any significant changes post-TIPS/varix embolization that may be the source of his pain.  He presents today via tele-visit to discuss these results.   Now complains of low back pain. Still having some abdominal pain. Good appetite. Still taking lactulose, occasional mild confusion. States that he has gained some weight, has some leg swelling.  Continues to  take lasix and spironolactone, no recent dose changes. Has an upcoming appointment with GI.  He last followed up with [name] PA with GI 01/01/23 and endorsed back, abdominal and chest pain. The abdominal pain had improved since starting dicyclomine and zofran. He was encouraged to called Cardiology for follow up but this has not been done. He receives IV iron as needed for anemia.  He presents today via tele-visit for follow up and to discuss the results of his recent MR abdomen.    Past Medical History:  Diagnosis Date   Anxiety 08/2022   CKD (chronic kidney disease) stage 2, GFR 60-89 ml/min 05/24/2022   stage 3a   Depression 06/2022   GERD (gastroesophageal reflux disease)    H/O colonoscopy    H/O endoscopy    Headache    Hypertension    Liver cirrhosis (HCC)    Neuropathy    OSA (obstructive sleep apnea)    not currently using CPAP   S/P abdominal paracentesis     Past Surgical History:  Procedure Laterality Date   BIOPSY  09/12/2019   Procedure: BIOPSY;  Surgeon: Corbin Ade, MD;  [facility];  Service: Endoscopy;;   COLONOSCOPY N/A 09/18/2019   Dr. Darrick Penna: 12 colon polyps removed, multiple simple adenomas and some inflammatory polyps, diverticulosis, external and internal hemorrhoids.  Next colonoscopy in 3 years.   COLONOSCOPY WITH PROPOFOL N/A 03/24/2022   Procedure: COLONOSCOPY WITH PROPOFOL;  Surgeon: Lanelle Bal, DO;  [facility];  Service: Endoscopy;  Laterality: N/A;  1:30pm, asa 3   ESOPHAGEAL BANDING N/A 09/12/2019   Procedure: ESOPHAGEAL BANDING;  Surgeon: Corbin Ade, MD;  [facility];  Service: Endoscopy;  Laterality: N/A;   ESOPHAGOGASTRODUODENOSCOPY N/A  09/12/2019   Dr. Jena Gauss: Mild erosive reflux esophagitis.  Schatzki ring status post dilation.  Small grade 1/grade 2 esophageal varices, portal hypertensive gastropathy, hyperplastic gastric polyp, gastric erosions with chronic inactive gastritis on  biopsies, no H. pylori.   ESOPHAGOGASTRODUODENOSCOPY (EGD) WITH PROPOFOL N/A 12/16/2020   two short columns of no more than Grade 2 esophageal varices, appearing innocent. Grade 1 varices not apparent today. Multiple large posterior body and antral hyperplastic, hemorrhagic appearing polyps, larges approximately 2.5 cm pedunculated. Portal gastropathy. Normal duodenum. Polypectomy with clips placed. EGD in 18 months.   ESOPHAGOGASTRODUODENOSCOPY (EGD) WITH PROPOFOL N/A 03/24/2022   Procedure: ESOPHAGOGASTRODUODENOSCOPY (EGD) WITH PROPOFOL;  Surgeon: Lanelle Bal, DO;  [facility];  Service: Endoscopy;  Laterality: N/A;   IR ANGIOGRAM SELECTIVE EACH ADDITIONAL VESSEL  09/04/2022   IR EMBO VENOUS NOT HEMORR HEMANG  INC GUIDE ROADMAPPING  09/04/2022   IR INTRAVASCULAR ULTRASOUND NON CORONARY  09/04/2022   IR PARACENTESIS  09/04/2022   IR RADIOLOGIST EVAL & MGMT  07/19/2022   IR RADIOLOGIST EVAL & MGMT  08/14/2022   IR RADIOLOGIST EVAL & MGMT  09/29/2022   IR RADIOLOGIST EVAL & MGMT  10/31/2022   IR RADIOLOGIST EVAL & MGMT  11/20/2022   IR TIPS  09/04/2022   IR US GUIDE VASC ACCESS RIGHT  09/04/2022   POLYPECTOMY  09/18/2019   Procedure: POLYPECTOMY;  Surgeon: West Bali, MD;  [facility];  Service: Endoscopy;;   POLYPECTOMY  12/16/2020   Procedure: POLYPECTOMY;  Surgeon: Corbin Ade, MD;  [facility];  Service: Endoscopy;;  gastric   POLYPECTOMY  03/24/2022   Procedure: POLYPECTOMY;  Surgeon: Lanelle Bal, DO;  [facility];  Service: Endoscopy;;   RADIOLOGY WITH ANESTHESIA N/A 09/04/2022   Procedure: TIPS;  Surgeon: Bennie Dallas, MD;  Location: [facility];  Service: Radiology;  Laterality: N/A;    Allergies: Chlorthalidone and Metoprolol  Medications: Prior to Admission medications   Medication Sig Start Date End Date Taking? Authorizing Provider  Cholecalciferol (VITAMIN D3) 125 MCG (5000 UT) TABS Take 5,000 Units by mouth in the morning.  06/17/19   [provider]  ciprofloxacin (CIPRO) 500 MG tablet TAKE 1 TABLET BY MOUTH DAILY 01/21/23   Letta Median, PA-C  cyclobenzaprine (FLEXERIL) 10 MG tablet Take 10 mg by mouth 3 (three) times daily as needed for muscle spasms. 09/29/20   [provider]  dicyclomine (BENTYL) 10 MG capsule TAKE 1 CAPSULE BY MOUTH DAILY BEFORE BREAKFAST 01/21/23   Letta Median, PA-C  DULoxetine (CYMBALTA) 60 MG capsule Take 120 mg by mouth daily. 05/03/22   [provider]  FEROSUL 325 (65 Fe) MG tablet Take 325 mg by mouth 2 (two) times daily. 09/26/22   [provider]  furosemide (LASIX) 40 MG tablet Take 20 mg by mouth 2 (two) times daily. 01/23/22   [provider]  gabapentin (NEURONTIN) 300 MG capsule Take 300 mg by mouth 3 (three) times daily as needed (pain.).    [provider]  lactulose (CHRONULAC) 10 GM/15ML solution Take 60 mLs (40 g total) by mouth 3 (three) times daily. Titrate to goal of 3-4 BMs daily. 09/18/22   Letta Median, PA-C  levOCARNitine (CARNITOR) 330 MG tablet Take 330 mg by mouth 3 (three) times daily. 09/01/20   [provider]  Multiple Vitamin (MULTIVITAMIN) tablet Take 1 tablet by mouth in the morning.    [provider]  ondansetron [facility])  4 MG tablet TAKE 1 TABLET BY MOUTH THREE TIMES DAILY BEFORE MEALS 12/19/22   Letta Median, PA-C  pantoprazole (PROTONIX) 40 MG tablet Take 1 tablet (40 mg total) by mouth 2 (two) times daily before a meal. 01/07/23   Letta Median, PA-C  spironolactone (ALDACTONE) 100 MG tablet Take 100 mg by mouth daily.    [provider]  XIFAXAN 550 MG TABS tablet TAKE 1 TABLET BY MOUTH TWICE DAILY 09/11/22   Letta Median, PA-C  Zinc 50 MG TABS Take 50 mg by mouth in the morning.    [provider]     Family History  Problem Relation Age of Onset   Brain cancer Mother    Diabetes Mother    Diabetes Father    Pulmonary embolism Brother     Liver disease Neg Hx     Social History   Socioeconomic History   Marital status: Single    Spouse name: Not on file   Number of children: Not on file   Years of education: Not on file   Highest education level: Not on file  Occupational History   Occupation: umemployed  Tobacco Use   Smoking status: Never    Passive exposure: Current   Smokeless tobacco: Never  Vaping Use   Vaping status: Never Used  Substance and Sexual Activity   Alcohol use: Not Currently   Drug use: Never   Sexual activity: Not Currently  Other Topics Concern   Not on file  Social History Narrative   Not on file   Social Determinants of Health   Financial Resource Strain: Not on file  Food Insecurity: Low Risk  (05/22/2022)   [facility]   Hunger Vital Sign    Worried About Running Out of Food in the Last Year: Never true    Ran Out of Food in the Last Year: Never true  Transportation Needs: Not on file (05/22/2022)  Physical Activity: Not on file  Stress: Not on file  Social Connections: Not on file     Vital Signs: There were no vitals taken for this visit.  No physical exam was performed in lieu of virtual telephone visit.   Imaging: TIPS 09/04/22    CTA [facility] 11/10/22 Patent portal system, TIPS.  No new varices.  Smaller umbilical hernia.  Small volume perihepatic ascites  Labs:  12/11-27/23 -->  08/03/22 --> 10/26/22--> Creatinine: 1.46 --> 1.49 --> 1.09-->1.08 Total Bilirubin: 1.0 --> 1.1 --> 1.6-->2.8 INR: 1.1 --> 1.4 --> 1.1-->1.1 (11/29/22 labs) Sodium: 126 --> 125 --> 131-->130 Albumin: 3.8 --> 3.6 --> 3.0-->3.1  CBC: Recent Labs    10/26/22 1448 11/06/22 1015 11/29/22 1008 01/18/23 0946  WBC 4.7 5.5 4.7 5.3  HGB 10.9* 11.1* 11.9* 11.8*  HCT 31.4* 31.3* 33.6* 33.9*  PLT 88* 80* 76* 102*    COAGS: Recent Labs    09/04/22 0559 09/05/22 0739 10/13/22 0927 11/29/22 1008  INR 1.2 1.3* 1.1 1.1    BMP: Recent Labs    09/04/22 0559  09/05/22 0739 10/13/22 0927 11/29/22 1008 12/06/22 1338 01/18/23 0935 01/18/23 0946  NA 124* 124*   < > 133* 131* 130* 130*  K 4.3 4.5   < > 4.3 4.1 3.9 4.0  CL 92* 95*   < > 104 100 100 99  CO2 19* 18*   < > 23 25 22 23   GLUCOSE 109* 112*   < > 99 88 96 94  BUN 21* 17   < >  12 12 14 14   CALCIUM 8.8* 8.9   < > 8.9 8.8 8.7* 8.7*  CREATININE 1.42* 1.22   < > 0.97 1.07 1.04 1.08  GFRNONAA >60 >60  --   --   --  >60 >60   < > = values in this interval not displayed.    LIVER FUNCTION TESTS: Recent Labs    05/15/22 0801 08/03/22 1102 09/05/22 0739 10/13/22 0927 10/26/22 1448 11/29/22 1008 01/18/23 0946  BILITOT 1.0 1.1 1.0 1.9* 1.6* 1.9* 2.8*  AST 51* 45* 133* 41* 53* 31 49*  ALT 47* 34 88* 21 31 19  32  ALKPHOS 127* 120 113  --   --   --  154*  PROT 8.2* 7.1 5.9* 6.9 6.0* 6.2 6.8  ALBUMIN 3.8 3.6 3.0*  --   --   --  3.1*    Assessment and Plan:  Alejandro Porter is a 47 y.o. 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  He is apparently a poor candidate for transplant due to social factors. He is now status post TIPS creation on 09/04/22.  He still has not had a paracentesis since the TIPS.  His renal function has improved significantly.  Recent MRI without evidence of hepatoma.    Electronically Signed: Mickie Kay 02/12/2023, 4:41 PM   I spent a total of 40 Minutes in virtual clinical consultation, greater than 50% of which was counseling/coordinating care for portal hypertension.

## 2023-02-14 ENCOUNTER — Encounter: Payer: Self-pay | Admitting: *Deleted

## 2023-02-14 ENCOUNTER — Ambulatory Visit
Admission: RE | Admit: 2023-02-14 | Discharge: 2023-02-14 | Disposition: A | Discharge status: Home / Self Care | Payer: Medicaid Other | Source: Ambulatory Visit | Attending: Interventional Radiology | Admitting: Interventional Radiology

## 2023-02-14 DIAGNOSIS — R188 Other ascites: Secondary | ICD-10-CM

## 2023-02-15 ENCOUNTER — Other Ambulatory Visit: Payer: Self-pay | Admitting: Interventional Radiology

## 2023-02-15 DIAGNOSIS — K7460 Unspecified cirrhosis of liver: Secondary | ICD-10-CM

## 2023-02-16 ENCOUNTER — Inpatient Hospital Stay: Payer: Medicaid Other

## 2023-02-20 NOTE — Progress Notes (Signed)
Reason for follow up:  Patient presents via virtual telephone visit today for follow up after TIPS creation 09/04/22    Referring Physician(s): Earnest Bailey, DO, [name], NP, [name], PA-C, Annamarie Major, NP   History of present illness: HPI from last visit 11/20/22: Alejandro Porter is a 47 y.o. male  with a medical history significant for HTN, chronic kidney disease stage 3a, OSA and metabolic dysfunction-associated steatohepatitis with portal hypertension, esophageal varices, portal hypertensive gastropathy and recurrent ascites. He also has a non-occlusive chronic main portal vein thrombus and a history of encephalopathy. He has been evaluated for possible liver transplant but was considered a poor candidate due to social factors. The patient was referred to Interventional Radiology to discuss portal hypertension treatment/management options and we first met 07/19/22 via tele-health visit. He is now status post TIPS creation, paracentesis, and paraumbilical vein coil embolization on 09/04/22.  He has not required paracentesis since the procedure. His post-TIPS follow up appointment was 09/29/22 and he had complaints of very low energy and appetite levels, nausea, and pain radiating across his chest which he was treating with gabapentin. He had been compliant with lactulose and rifaximin and was without any encephalopathic symptoms.   He followed up with me 10/31/22 and had numerous complaints including shoulder, chest and abdominal pain, lack of appetite and occasional mild encephalopathy.  I ordered a multiphase CTA abdomen/pelvis to assess for any significant changes post-TIPS/varix embolization that may be the source of his pain.  He presents today via tele-visit to discuss these results.   Now complains of low back pain. Still having some abdominal pain. Good appetite. Still taking lactulose, occasional mild confusion. States that he has gained some weight, has some leg swelling. Continues to  take lasix and spironolactone, no recent dose changes. Has an upcoming appointment with GI.   He last followed up with [name] PA with GI 01/01/23 and endorsed back, abdominal and chest pain. The abdominal pain had improved since starting dicyclomine and zofran. He was encouraged to called Cardiology for follow up but the patient did not do this. He receives IV iron as needed for anemia.   Past Medical History:  Diagnosis Date   Anxiety 08/2022   CKD (chronic kidney disease) stage 2, GFR 60-89 ml/min 05/24/2022   stage 3a   Depression 06/2022   GERD (gastroesophageal reflux disease)    H/O colonoscopy    H/O endoscopy    Headache    Hypertension    Liver cirrhosis (HCC)    Neuropathy    OSA (obstructive sleep apnea)    not currently using CPAP   S/P abdominal paracentesis     Past Surgical History:  Procedure Laterality Date   BIOPSY  09/12/2019   Procedure: BIOPSY;  Surgeon: Corbin Ade, MD;  [facility];  Service: Endoscopy;;   COLONOSCOPY N/A 09/18/2019   Dr. Darrick Penna: 12 colon polyps removed, multiple simple adenomas and some inflammatory polyps, diverticulosis, external and internal hemorrhoids.  Next colonoscopy in 3 years.   COLONOSCOPY WITH PROPOFOL N/A 03/24/2022   Procedure: COLONOSCOPY WITH PROPOFOL;  Surgeon: Lanelle Bal, DO;  [facility];  Service: Endoscopy;  Laterality: N/A;  1:30pm, asa 3   ESOPHAGEAL BANDING N/A 09/12/2019   Procedure: ESOPHAGEAL BANDING;  Surgeon: Corbin Ade, MD;  [facility];  Service: Endoscopy;  Laterality: N/A;   ESOPHAGOGASTRODUODENOSCOPY N/A 09/12/2019   Dr. Jena Gauss: Mild erosive reflux esophagitis.  Schatzki ring status post dilation.  Small grade 1/grade 2 esophageal varices, portal hypertensive gastropathy, hyperplastic gastric polyp, gastric erosions with chronic inactive gastritis on biopsies, no H. pylori.   ESOPHAGOGASTRODUODENOSCOPY (EGD) WITH PROPOFOL N/A 12/16/2020   two short  columns of no more than Grade 2 esophageal varices, appearing innocent. Grade 1 varices not apparent today. Multiple large posterior body and antral hyperplastic, hemorrhagic appearing polyps, larges approximately 2.5 cm pedunculated. Portal gastropathy. Normal duodenum. Polypectomy with clips placed. EGD in 18 months.   ESOPHAGOGASTRODUODENOSCOPY (EGD) WITH PROPOFOL N/A 03/24/2022   Procedure: ESOPHAGOGASTRODUODENOSCOPY (EGD) WITH PROPOFOL;  Surgeon: Lanelle Bal, DO;  [facility];  Service: Endoscopy;  Laterality: N/A;   IR ANGIOGRAM SELECTIVE EACH ADDITIONAL VESSEL  09/04/2022   IR EMBO VENOUS NOT HEMORR HEMANG  INC GUIDE ROADMAPPING  09/04/2022   IR INTRAVASCULAR ULTRASOUND NON CORONARY  09/04/2022   IR PARACENTESIS  09/04/2022   IR RADIOLOGIST EVAL & MGMT  07/19/2022   IR RADIOLOGIST EVAL & MGMT  08/14/2022   IR RADIOLOGIST EVAL & MGMT  09/29/2022   IR RADIOLOGIST EVAL & MGMT  10/31/2022   IR RADIOLOGIST EVAL & MGMT  11/20/2022   IR TIPS  09/04/2022   IR US GUIDE VASC ACCESS RIGHT  09/04/2022   POLYPECTOMY  09/18/2019   Procedure: POLYPECTOMY;  Surgeon: West Bali, MD;  [facility];  Service: Endoscopy;;   POLYPECTOMY  12/16/2020   Procedure: POLYPECTOMY;  Surgeon: Corbin Ade, MD;  [facility];  Service: Endoscopy;;  gastric   POLYPECTOMY  03/24/2022   Procedure: POLYPECTOMY;  Surgeon: Lanelle Bal, DO;  [facility];  Service: Endoscopy;;   RADIOLOGY WITH ANESTHESIA N/A 09/04/2022   Procedure: TIPS;  Surgeon: Bennie Dallas, MD;  Location: [facility];  Service: Radiology;  Laterality: N/A;    Allergies: Chlorthalidone and Metoprolol  Medications: Prior to Admission medications   Medication Sig Start Date End Date Taking? Authorizing Provider  Cholecalciferol (VITAMIN D3) 125 MCG (5000 UT) TABS Take 5,000 Units by mouth in the morning. 06/17/19   [provider]  ciprofloxacin (CIPRO) 500 MG tablet TAKE 1 TABLET BY MOUTH DAILY  01/21/23   Letta Median, PA-C  cyclobenzaprine (FLEXERIL) 10 MG tablet Take 10 mg by mouth 3 (three) times daily as needed for muscle spasms. 09/29/20   [provider]  dicyclomine (BENTYL) 10 MG capsule TAKE 1 CAPSULE BY MOUTH DAILY BEFORE BREAKFAST 01/21/23   Letta Median, PA-C  DULoxetine (CYMBALTA) 60 MG capsule Take 120 mg by mouth daily. 05/03/22   [provider]  FEROSUL 325 (65 Fe) MG tablet Take 325 mg by mouth 2 (two) times daily. 09/26/22   [provider]  furosemide (LASIX) 40 MG tablet Take 20 mg by mouth 2 (two) times daily. 01/23/22   [provider]  gabapentin (NEURONTIN) 300 MG capsule Take 300 mg by mouth 3 (three) times daily as needed (pain.).    [provider]  lactulose (CHRONULAC) 10 GM/15ML solution Take 60 mLs (40 g total) by mouth 3 (three) times daily. Titrate to goal of 3-4 BMs daily. 09/18/22   Letta Median, PA-C  levOCARNitine (CARNITOR) 330 MG tablet Take 330 mg by mouth 3 (three) times daily. 09/01/20   [provider]  Multiple Vitamin (MULTIVITAMIN) tablet Take 1 tablet by mouth in the morning.    [provider]  ondansetron (ZOFRAN) 4 MG tablet TAKE 1 TABLET BY MOUTH THREE TIMES DAILY BEFORE MEALS 12/19/22  Letta Median, PA-C  pantoprazole (PROTONIX) 40 MG tablet Take 1 tablet (40 mg total) by mouth 2 (two) times daily before a meal. 01/07/23   Letta Median, PA-C  spironolactone (ALDACTONE) 100 MG tablet Take 100 mg by mouth daily.    [provider]  XIFAXAN 550 MG TABS tablet TAKE 1 TABLET BY MOUTH TWICE DAILY 09/11/22   Letta Median, PA-C  Zinc 50 MG TABS Take 50 mg by mouth in the morning.    [provider]     Family History  Problem Relation Age of Onset   Brain cancer Mother    Diabetes Mother    Diabetes Father    Pulmonary embolism Brother    Liver disease Neg Hx     Social History   Socioeconomic History   Marital status: Single     Spouse name: Not on file   Number of children: Not on file   Years of education: Not on file   Highest education level: Not on file  Occupational History   Occupation: umemployed  Tobacco Use   Smoking status: Never    Passive exposure: Current   Smokeless tobacco: Never  Vaping Use   Vaping status: Never Used  Substance and Sexual Activity   Alcohol use: Not Currently   Drug use: Never   Sexual activity: Not Currently  Other Topics Concern   Not on file  Social History Narrative   Not on file   Social Determinants of Health   Financial Resource Strain: Not on file  Food Insecurity: Low Risk  (05/22/2022)   [facility]   Hunger Vital Sign    Worried About Running Out of Food in the Last Year: Never true    Ran Out of Food in the Last Year: Never true  Transportation Needs: Not on file (05/22/2022)  Physical Activity: Not on file  Stress: Not on file  Social Connections: Not on file     Vital Signs: There were no vitals taken for this visit.  No physical exam was performed in lieu of virtual telephone visit.   Imaging: TIPS 09/04/22    CTA [facility] 11/10/22 Patent portal system, TIPS.  No new varices.  Smaller umbilical hernia.  Small volume perihepatic ascites.    Labs:  CBC: Recent Labs    10/26/22 1448 11/06/22 1015 11/29/22 1008 01/18/23 0946  WBC 4.7 5.5 4.7 5.3  HGB 10.9* 11.1* 11.9* 11.8*  HCT 31.4* 31.3* 33.6* 33.9*  PLT 88* 80* 76* 102*    COAGS: Recent Labs    09/04/22 0559 09/05/22 0739 10/13/22 0927 11/29/22 1008  INR 1.2 1.3* 1.1 1.1    BMP: Recent Labs    09/04/22 0559 09/05/22 0739 10/13/22 0927 11/29/22 1008 12/06/22 1338 01/18/23 0935 01/18/23 0946  NA 124* 124*   < > 133* 131* 130* 130*  K 4.3 4.5   < > 4.3 4.1 3.9 4.0  CL 92* 95*   < > 104 100 100 99  CO2 19* 18*   < > 23 25 22 23   GLUCOSE 109* 112*   < > 99 88 96 94  BUN 21* 17   < > 12 12 14 14   CALCIUM 8.8* 8.9   < > 8.9 8.8 8.7* 8.7*   CREATININE 1.42* 1.22   < > 0.97 1.07 1.04 1.08  GFRNONAA >60 >60  --   --   --  >60 >60   < > = values in  this interval not displayed.    LIVER FUNCTION TESTS: Recent Labs    05/15/22 0801 08/03/22 1102 09/05/22 0739 10/13/22 0927 10/26/22 1448 11/29/22 1008 01/18/23 0946  BILITOT 1.0 1.1 1.0 1.9* 1.6* 1.9* 2.8*  AST 51* 45* 133* 41* 53* 31 49*  ALT 47* 34 88* 21 31 19  32  ALKPHOS 127* 120 113  --   --   --  154*  PROT 8.2* 7.1 5.9* 6.9 6.0* 6.2 6.8  ALBUMIN 3.8 3.6 3.0*  --   --   --  3.1*    Assessment and Plan:  Srihaan Beevers is a 47 y.o. 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  He is apparently a poor candidate for transplant due to social factors. He is now status post TIPS creation on 09/04/22.  He still has not had a paracentesis since the TIPS.  His renal function has improved significantly.  CTA on 11/10/22 demonstrates a patent TIPS, small perihepatic ascites.  Despite these great outcomes, he continues endorses persistent post-prandial abdominal pain, malaise, back, and shoulder pain, now with lower extremity swelling and weight gain   Electronically Signed: Mickie Kay 02/20/2023, 12:14 PM   I spent a total of 25 Minutes in virtual clinical consultation, greater than 50% of which was counseling/coordinating care for portal hypertension

## 2023-02-21 ENCOUNTER — Ambulatory Visit
Admission: RE | Admit: 2023-02-21 | Discharge: 2023-02-21 | Disposition: A | Discharge status: Home / Self Care | Payer: Medicaid Other | Source: Ambulatory Visit | Attending: Interventional Radiology | Admitting: Interventional Radiology

## 2023-02-21 DIAGNOSIS — K7460 Unspecified cirrhosis of liver: Secondary | ICD-10-CM

## 2023-02-21 HISTORY — PX: IR RADIOLOGIST EVAL & MGMT: IMG5224

## 2023-02-28 ENCOUNTER — Ambulatory Visit (HOSPITAL_COMMUNITY): Payer: Medicaid Other | Admitting: Clinical

## 2023-02-28 DIAGNOSIS — F0630 Mood disorder due to known physiological condition, unspecified: Secondary | ICD-10-CM

## 2023-02-28 DIAGNOSIS — F411 Generalized anxiety disorder: Secondary | ICD-10-CM | POA: Diagnosis not present

## 2023-02-28 NOTE — Progress Notes (Signed)
Virtual Visit via Video Note  I connected with Alejandro Porter on 02/28/23 at  1:00 PM EDT by a video enabled telemedicine application and verified that I am speaking with the correct person using two identifiers.  Location: Patient: home Provider: office   I discussed the limitations of evaluation and management by telemedicine and the availability of in person appointments. The patient expressed understanding and agreed to proceed.   THERAPIST PROGRESS NOTE   Session Time: 1:00 PM- 1:30 PM   Participation Level: Active   Behavioral Response: CasualAlertAnxious   Type of Therapy: Individual Therapy   Treatment Goals addressed: Coping for anxiety and depression   Interventions: CBT, Motivational Interviewing, Strength-based and Supportive   Summary: Alejandro Porter is a 47 y.o. 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 The OPT therapist worked with the patient for his ongoing OPT treatment. The OPT therapist utilized Motivational Interviewing to assist in creating therapeutic repore. The patient in the session was engaged and work in collaboration giving feedback about his triggers and symptoms over the past few weeks. The patient spoke about this physical health condition with his stage 2 liver diease. The patient noted that his Father passed 2 weeks ago and he has been distraught and in grieving since his Father passed and noted now he is currently living at home alone as he was living with his Father. The OPT therapist utilized Cognitive Behavioral Therapy through cognitive restructuring as well as worked with the patient on coping strategies to assist in management of his diagnoses as well as his thought process and positive thinking. The OPT therapist worked with the patient on managing interactions in the home, utilizing coping skills, and work on Sport and exercise psychologist. The OPT therapist provided psycho-education in the grieving process. The patient  verbalized several times during session he was not going to hurt himself or anyone else he has just been sad because the loss of his Father.     Suicidal/Homicidal: Nowithout intent/plan   Therapist Response: The OPT therapist worked with the patient for the patients scheduled session. The patient was engaged in his session and gave feedback in relation to triggers, symptoms, and behavior responses over the past few weeks.The patient reviewed the impact of losing his Father and the change this has created for him in the home as he was living with his Father up until his Father passed. The OPT therapist worked with the patient utilizing an in session Cognitive Behavioral Therapy exercise. The patient was responsive in the session and verbalized, "I am trying to control my emotions but I am upset of course and I am trying to not get as angry". The OPT therapist worked with the patient on management of his reactive behavior and not letting things escalate in the home, focus on self care, and consistency with his health appointments. The patient noted his willingness to take things one day at a time and was able to rationalize that this will be a process and will understandably take time to start to get easier.The OPT therapist will continue treatment work with the patient in his next scheduled session.    Plan: Return again in 3 weeks.   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 The patient was provided an opportunity to ask questions and all were answered. The patient agreed with the plan and demonstrated an understanding of the instructions.   The patient was advised to call back or seek an in-person evaluation if the symptoms worsen or if the condition fails to improve as anticipated.   I provided 30 minutes of non-face-to-face time during this encounter.   Winfred Burn, LCSW   02/28/2023

## 2023-03-02 ENCOUNTER — Ambulatory Visit (HOSPITAL_COMMUNITY): Admission: RE | Admit: 2023-03-02 | Payer: Medicaid Other | Source: Ambulatory Visit

## 2023-03-11 NOTE — Progress Notes (Deleted)
Referring Provider: Reather Converse, PA-C Primary Care Physician:  Reather Converse, PA-C Primary GI Physician: Dr. Marletta Lor  No chief complaint on file.   HPI:   Alejandro Porter is a 47 y.o. male presenting today for follow-up of cirrhosis.    He has hstory of cirrhosis complicated by portal hypertension, esophageal varices, portal vein thrombosis previously on Eliquis (discontinued 09/2022), hepatic encephalopathy on lactulose and Xfiaxan, ascites requiring repeat paras, history of SBP in April 2022 , liver lesions noted on MRI November 2023.  He developed worsening ascites requiring increasingly frequent paracentesis with inability to titrate diuretics due to worsening renal funtion, hyponatremia, and ultimately underwent TIPS procedure 09/04/22. He was undergoing liver transplant eval at [facility], but this was closed as it was felt he was not a good candidate secondary to the lack of support though he is still trying to work on this.   Also with history of biliary dyskinesia but not a surgical candidate, GERD, IDA followed by Hematology.    Last seen in the office 01/01/23.   Reported ongoing back pain that had started after a fall and had not followed up with PCP on this. Still with chest pain and had not seen cardiology. He had no ascites or peripheral edema on Lasix 20 mg BID and spironolactone 100 mg daily. No HE, but still forgetting to take lactulose at times and occasionally skipping a day without a BM. Chronic abdominal pain more tolerable with dicyclomine daily. Nausea under control but taking 3 Zoftan every morning. No GERD on pantoprazole 40 mg daily. Plan included increase pantoprazole to 40 mg BID to see if this would help nausea, only take 1 zofran every 8 hours as needed, continue dicyclomine daily, continue cirrhosis medications, arrange MRI liver protocol, see cardiology for chest pain, see PCP for back pain  MRI abdomen w/w/o contrast 01/19/23  Exam significantly  limited by breath motion artifact.  No arterially hyperenhancing lesions identified in the liver. Previously described LI-RADS 3 lesions in the right lobe of the liver and adjacent to the intrahepatic IVC are not clearly appreciated on today's examination. Right hepatic vein TIPS, patency not well assessed by MR due to susceptibility artifact. Small volume perihepatic ascites.  He did have an office visit with cardiology 01/10/2023.   Chest pain was felt to be atypical and may in fact be more inflammatory in etiology.  Recommended updating echocardiogram due to cardiomegaly on recent chest CT.   No showed for echo scheduled for 8/17. ***   Today:  Cirrhosis:    MELD 3.0: 13 on 11/29/2022. Most recent labs 01/18/2023 with total bilirubin increased to 2.8, AST 49, alk phos 154, AST 32.Using INR from June, MELD 3.0 increases to 17.   Imaging:  MRI liver protocol 01/19/23.  Exam significantly limited due to breath motion artifact, but no arterially hyperenhancing lesions.  Consider triphasic CT for next imaging *** AFP: 2.2 in October 2023. Hep A/B vaccination: Complete.  EGD:  Oct 2023; small esophageal varices, portal gastropathy, multiple gastric polyps s/p resection and retrieval (hyperplastic) duodenitis. Recommended 1 year surveillance.  BB: Contraindicated with history of SBP.  Ascites/peripheral edema:  Diuretics:  Last Paracentesis: At time of TIPS 09/04/22 with 7.8 L removed.  History of SBP: Yes. On Encephalopathy:     Completed Hep A/B vaccination series.   Past Medical History:  Diagnosis Date   Anxiety 08/2022   CKD (chronic kidney disease) stage 2, GFR 60-89 ml/min 05/24/2022   stage 3a   Depression  06/2022   GERD (gastroesophageal reflux disease)    H/O colonoscopy    H/O endoscopy    Headache    Hypertension    Liver cirrhosis (HCC)    Neuropathy    OSA (obstructive sleep apnea)    not currently using CPAP   S/P abdominal paracentesis     Past Surgical History:   Procedure Laterality Date   BIOPSY  09/12/2019   Procedure: BIOPSY;  Surgeon: Corbin Ade, MD;  [facility];  Service: Endoscopy;;   COLONOSCOPY N/A 09/18/2019   Dr. Darrick Penna: 12 colon polyps removed, multiple simple adenomas and some inflammatory polyps, diverticulosis, external and internal hemorrhoids.  Next colonoscopy in 3 years.   COLONOSCOPY WITH PROPOFOL N/A 03/24/2022   Procedure: COLONOSCOPY WITH PROPOFOL;  Surgeon: Lanelle Bal, DO;  [facility]: [facility];  Service: Endoscopy;  Laterality: N/A;  1:30pm, asa 3   ESOPHAGEAL BANDING N/A 09/12/2019   Procedure: ESOPHAGEAL BANDING;  Surgeon: Corbin Ade, MD;  [facility];  Service: Endoscopy;  Laterality: N/A;   ESOPHAGOGASTRODUODENOSCOPY N/A 09/12/2019   Dr. Jena Gauss: Mild erosive reflux esophagitis.  Schatzki ring status post dilation.  Small grade 1/grade 2 esophageal varices, portal hypertensive gastropathy, hyperplastic gastric polyp, gastric erosions with chronic inactive gastritis on biopsies, no H. pylori.   ESOPHAGOGASTRODUODENOSCOPY (EGD) WITH PROPOFOL N/A 12/16/2020   two short columns of no more than Grade 2 esophageal varices, appearing innocent. Grade 1 varices not apparent today. Multiple large posterior body and antral hyperplastic, hemorrhagic appearing polyps, larges approximately 2.5 cm pedunculated. Portal gastropathy. Normal duodenum. Polypectomy with clips placed. EGD in 18 months.   ESOPHAGOGASTRODUODENOSCOPY (EGD) WITH PROPOFOL N/A 03/24/2022   Procedure: ESOPHAGOGASTRODUODENOSCOPY (EGD) WITH PROPOFOL;  Surgeon: Lanelle Bal, DO;  [facility];  Service: Endoscopy;  Laterality: N/A;   IR ANGIOGRAM SELECTIVE EACH ADDITIONAL VESSEL  09/04/2022   IR EMBO VENOUS NOT HEMORR HEMANG  INC GUIDE ROADMAPPING  09/04/2022   IR INTRAVASCULAR ULTRASOUND NON CORONARY  09/04/2022   IR PARACENTESIS  09/04/2022   IR RADIOLOGIST EVAL & MGMT  07/19/2022   IR RADIOLOGIST EVAL & MGMT  08/14/2022    IR RADIOLOGIST EVAL & MGMT  09/29/2022   IR RADIOLOGIST EVAL & MGMT  10/31/2022   IR RADIOLOGIST EVAL & MGMT  11/20/2022   IR RADIOLOGIST EVAL & MGMT  02/21/2023   IR TIPS  09/04/2022   IR US GUIDE VASC ACCESS RIGHT  09/04/2022   POLYPECTOMY  09/18/2019   Procedure: POLYPECTOMY;  Surgeon: West Bali, MD;  [facility];  Service: Endoscopy;;   POLYPECTOMY  12/16/2020   Procedure: POLYPECTOMY;  Surgeon: Corbin Ade, MD;  [facility];  Service: Endoscopy;;  gastric   POLYPECTOMY  03/24/2022   Procedure: POLYPECTOMY;  Surgeon: Lanelle Bal, DO;  [facility];  Service: Endoscopy;;   RADIOLOGY WITH ANESTHESIA N/A 09/04/2022   Procedure: TIPS;  Surgeon: Bennie Dallas, MD;  Location: [facility];  Service: Radiology;  Laterality: N/A;    Current Outpatient Medications  Medication Sig Dispense Refill   Cholecalciferol (VITAMIN D3) 125 MCG (5000 UT) TABS Take 5,000 Units by mouth in the morning.     ciprofloxacin (CIPRO) 500 MG tablet TAKE 1 TABLET BY MOUTH DAILY 30 tablet 5   cyclobenzaprine (FLEXERIL) 10 MG tablet Take 10 mg by mouth 3 (three) times daily as needed for muscle spasms.     dicyclomine (BENTYL) 10 MG capsule TAKE 1  CAPSULE BY MOUTH DAILY BEFORE BREAKFAST 30 capsule 1   DULoxetine (CYMBALTA) 60 MG capsule Take 120 mg by mouth daily.     FEROSUL 325 (65 Fe) MG tablet Take 325 mg by mouth 2 (two) times daily.     furosemide (LASIX) 40 MG tablet Take 20 mg by mouth 2 (two) times daily.     gabapentin (NEURONTIN) 300 MG capsule Take 300 mg by mouth 3 (three) times daily as needed (pain.).     lactulose (CHRONULAC) 10 GM/15ML solution Take 60 mLs (40 g total) by mouth 3 (three) times daily. Titrate to goal of 3-4 BMs daily. 946 mL 11   levOCARNitine (CARNITOR) 330 MG tablet Take 330 mg by mouth 3 (three) times daily.     Multiple Vitamin (MULTIVITAMIN) tablet Take 1 tablet by mouth in the morning.     ondansetron (ZOFRAN) 4 MG tablet TAKE 1 TABLET BY  MOUTH THREE TIMES DAILY BEFORE MEALS 90 tablet 3   pantoprazole (PROTONIX) 40 MG tablet Take 1 tablet (40 mg total) by mouth 2 (two) times daily before a meal. 60 tablet 5   spironolactone (ALDACTONE) 100 MG tablet Take 100 mg by mouth daily.     XIFAXAN 550 MG TABS tablet TAKE 1 TABLET BY MOUTH TWICE DAILY 180 tablet 3   Zinc 50 MG TABS Take 50 mg by mouth in the morning.     No current facility-administered medications for this visit.    Allergies as of 03/14/2023 - Review Complete 02/02/2023  Allergen Reaction Noted   Chlorthalidone  11/04/2019   Metoprolol  11/04/2019    Family History  Problem Relation Age of Onset   Brain cancer Mother    Diabetes Mother    Diabetes Father    Pulmonary embolism Brother    Liver disease Neg Hx     Social History   Socioeconomic History   Marital status: Single    Spouse name: Not on file   Number of children: Not on file   Years of education: Not on file   Highest education level: Not on file  Occupational History   Occupation: umemployed  Tobacco Use   Smoking status: Never    Passive exposure: Current   Smokeless tobacco: Never  Vaping Use   Vaping status: Never Used  Substance and Sexual Activity   Alcohol use: Not Currently   Drug use: Never   Sexual activity: Not Currently  Other Topics Concern   Not on file  Social History Narrative   Not on file   Social Determinants of Health   Financial Resource Strain: Not on file  Food Insecurity: Low Risk  (05/22/2022)   [facility]   Hunger Vital Sign    Worried About Running Out of Food in the Last Year: Never true    Ran Out of Food in the Last Year: Never true  Transportation Needs: Not on file (05/22/2022)  Physical Activity: Not on file  Stress: Not on file  Social Connections: Not on file    Review of Systems: Gen: Denies fever, chills, anorexia. Denies fatigue, weakness, weight loss.  CV: Denies chest pain, palpitations, syncope,  peripheral edema, and claudication. Resp: Denies dyspnea at rest, cough, wheezing, coughing up blood, and pleurisy. GI: Denies vomiting blood, jaundice, and fecal incontinence.   Denies dysphagia or odynophagia. Derm: Denies rash, itching, dry skin Psych: Denies depression, anxiety, memory loss, confusion. No homicidal or suicidal ideation.  Heme: Denies bruising, bleeding, and enlarged lymph  nodes.  Physical Exam: There were no vitals taken for this visit. General:   Alert and oriented. No distress noted. Pleasant and cooperative.  Head:  Normocephalic and atraumatic. Eyes:  Conjuctiva clear without scleral icterus. Heart:  S1, S2 present without murmurs appreciated. Lungs:  Clear to auscultation bilaterally. No wheezes, rales, or rhonchi. No distress.  Abdomen:  +BS, soft, non-tender and non-distended. No rebound or guarding. No HSM or masses noted. Msk:  Symmetrical without gross deformities. Normal posture. Extremities:  Without edema. Neurologic:  Alert and  oriented x4 Psych:  Normal mood and affect.    Assessment:     Plan:  ***   [name], PA-C [facility] 03/14/2023

## 2023-03-14 ENCOUNTER — Encounter: Payer: Self-pay | Admitting: Gastroenterology

## 2023-03-14 ENCOUNTER — Telehealth: Payer: Self-pay | Admitting: Gastroenterology

## 2023-03-14 ENCOUNTER — Ambulatory Visit: Payer: Medicaid Other | Admitting: Gastroenterology

## 2023-03-14 NOTE — Telephone Encounter (Signed)
Patient's cousin, Alejandro Porter, came by and asked to speak to you about his records.  When I asked what she was referring to she said that the patient didn't understand some things and she wanted to know if he was supposed to be getting some x-rays done.  Please call her back to discuss.  She is now on [name] and that has been scanned into the chart.

## 2023-03-15 ENCOUNTER — Other Ambulatory Visit: Payer: Self-pay | Admitting: Gastroenterology

## 2023-03-15 DIAGNOSIS — K828 Other specified diseases of gallbladder: Secondary | ICD-10-CM

## 2023-03-19 ENCOUNTER — Inpatient Hospital Stay: Payer: Medicaid Other | Attending: Hematology

## 2023-03-19 VITALS — BP 118/66 | HR 78 | Temp 98.2°F | Resp 20

## 2023-03-19 DIAGNOSIS — D509 Iron deficiency anemia, unspecified: Secondary | ICD-10-CM | POA: Insufficient documentation

## 2023-03-19 DIAGNOSIS — D500 Iron deficiency anemia secondary to blood loss (chronic): Secondary | ICD-10-CM

## 2023-03-19 MED ORDER — SODIUM CHLORIDE 0.9 % IV SOLN
400.0000 mg | Freq: Once | INTRAVENOUS | Status: AC
  Administered 2023-03-19: 400 mg via INTRAVENOUS
  Filled 2023-03-19: qty 20
  Filled 2023-03-19: qty 400

## 2023-03-19 MED ORDER — SODIUM CHLORIDE 0.9 % IV SOLN: Freq: Once | INTRAVENOUS | Status: AC

## 2023-03-19 NOTE — Patient Instructions (Signed)
Iron Sucrose Injection What is this medication? IRON SUCROSE (EYE ern SOO krose) treats low levels of iron (iron deficiency anemia) in people with kidney disease. Iron is a mineral that plays an important role in making red blood cells, which carry oxygen from your lungs to the rest of your body. This medicine may be used for other purposes; ask your health care provider or pharmacist if you have questions. COMMON BRAND NAME(S): Venofer What should I tell my care team before I take this medication? They need to know if you have any of these conditions: Anemia not caused by low iron levels Heart disease High levels of iron in the blood Kidney disease Liver disease An unusual or allergic reaction to iron, other medications, foods, dyes, or preservatives Pregnant or trying to get pregnant Breastfeeding How should I use this medication? This medication is for infusion into a vein. It is given in a hospital or clinic setting. Talk to your care team about the use of this medication in children. While this medication may be prescribed for children as young as 2 years for selected conditions, precautions do apply. Overdosage: If you think you have taken too much of this medicine contact a poison control center or emergency room at once. NOTE: This medicine is only for you. Do not share this medicine with others. What if I miss a dose? Keep appointments for follow-up doses. It is important not to miss your dose. Call your care team if you are unable to keep an appointment. What may interact with this medication? Do not take this medication with any of the following: Deferoxamine Dimercaprol Other iron products This medication may also interact with the following: Chloramphenicol Deferasirox This list may not describe all possible interactions. Give your health care provider a list of all the medicines, herbs, non-prescription drugs, or dietary supplements you use. Also tell them if you smoke,  drink alcohol, or use illegal drugs. Some items may interact with your medicine. What should I watch for while using this medication? Visit your care team regularly. Tell your care team if your symptoms do not start to get better or if they get worse. You may need blood work done while you are taking this medication. You may need to follow a special diet. Talk to your care team. Foods that contain iron include: whole grains/cereals, dried fruits, beans, or peas, leafy green vegetables, and organ meats (liver, kidney). What side effects may I notice from receiving this medication? Side effects that you should report to your care team as soon as possible: Allergic reactions--skin rash, itching, hives, swelling of the face, lips, tongue, or throat Low blood pressure--dizziness, feeling faint or lightheaded, blurry vision Shortness of breath Side effects that usually do not require medical attention (report to your care team if they continue or are bothersome): Flushing Headache Joint pain Muscle pain Nausea Pain, redness, or irritation at injection site This list may not describe all possible side effects. Call your doctor for medical advice about side effects. You may report side effects to FDA at 1-800-FDA-1088. Where should I keep my medication? This medication is given in a hospital or clinic. It will not be stored at home. NOTE: This sheet is a summary. It may not cover all possible information. If you have questions about this medicine, talk to your doctor, pharmacist, or health care provider.  2024 Elsevier/Gold Standard (2022-10-27 00:00:00)

## 2023-03-19 NOTE — Progress Notes (Signed)
Pt here to receive venofer infusion. Pt states that "his dad has died recently and that he is really depressed, not able to eat much, and has a lot of things to handle." Darl Pikes- Child psychotherapist and jessica-dietitian was notified. Per susan she will reach out to patient tomorrow 03/20/2023 to assess if patient needs to receive any further resources or information. Pt updated and all questions answered at this time.   Patient tolerated iron infusion with no complaints voiced.  Peripheral IV site clean and dry with good blood return noted before and after infusion. VSS with discharge and left in satisfactory condition with no s/s of distress noted.   Amour Trigg [employer]

## 2023-03-24 NOTE — Progress Notes (Deleted)
Referring Provider: Reather Converse, PA-C Primary Care Physician:  Reather Converse, PA-C Primary GI Physician: Dr. Marletta Lor  No chief complaint on file.   HPI:   Alejandro Porter is a 47 y.o. male presenting today for follow-up of cirrhosis.    He has hstory of cirrhosis complicated by portal hypertension, esophageal varices, portal vein thrombosis previously on Eliquis (discontinued 09/2022), hepatic encephalopathy on lactulose and Xfiaxan, ascites requiring repeat paras, history of SBP in April 2022 , liver lesions noted on MRI November 2023.  He developed worsening ascites requiring increasingly frequent paracentesis with inability to titrate diuretics due to worsening renal funtion, hyponatremia, and ultimately underwent TIPS procedure 09/04/22. He was undergoing liver transplant eval at [facility], but this was closed as it was felt he was not a good candidate secondary to the lack of support though he is still trying to work on this.    Also with history of biliary dyskinesia but not a surgical candidate, GERD, IDA followed by Hematology.    Last seen in the office 01/01/23.   Reported ongoing back pain that had started after a fall and had not followed up with PCP on this. Still with chest pain and had not seen cardiology. He had no ascites or peripheral edema on Lasix 20 mg BID and spironolactone 100 mg daily. No HE, but still forgetting to take lactulose at times and occasionally skipping a day without a BM. Chronic abdominal pain more tolerable with dicyclomine daily. Nausea under control but taking 3 Zoftan every morning. No GERD on pantoprazole 40 mg daily. Plan included increase pantoprazole to 40 mg BID to see if this would help nausea, only take 1 zofran every 8 hours as needed, continue dicyclomine daily, continue cirrhosis medications, arrange MRI liver protocol, see cardiology for chest pain, see PCP for back pain   MRI abdomen w/w/o contrast 01/19/23  Exam significantly  limited by breath motion artifact.  No arterially hyperenhancing lesions identified in the liver. Previously described LI-RADS 3 lesions in the right lobe of the liver and adjacent to the intrahepatic IVC are not clearly appreciated on today's examination. Right hepatic vein TIPS, patency not well assessed by MR due to susceptibility artifact. Small volume perihepatic ascites.   He did have an office visit with cardiology 01/10/2023.   Chest pain was felt to be atypical and may in fact be more inflammatory in etiology.  Recommended updating echocardiogram due to cardiomegaly on recent chest CT.   No showed for echo scheduled for 8/17. ***     Today:   Cirrhosis:     MELD 3.0: 13 on 11/29/2022. Most recent labs 01/18/2023 with total bilirubin increased to 2.8, AST 49, alk phos 154, AST 32.Using INR from June, MELD 3.0 increases to 17.    Imaging:  MRI liver protocol 01/19/23.  Exam significantly limited due to breath motion artifact, but no arterially hyperenhancing lesions.  Consider triphasic CT for next imaging *** AFP: 2.2 in October 2023. Hep A/B vaccination: Complete.  EGD:  Oct 2023; small esophageal varices, portal gastropathy, multiple gastric polyps s/p resection and retrieval (hyperplastic) duodenitis. Recommended 1 year surveillance.  BB: Contraindicated with history of SBP.  Ascites/peripheral edema:  Diuretics:  Last Paracentesis: At time of TIPS 09/04/22 with 7.8 L removed.  History of SBP: Yes. On Encephalopathy:        Past Medical History:  Diagnosis Date   Anxiety 08/2022   CKD (chronic kidney disease) stage 2, GFR 60-89 ml/min 05/24/2022   stage  3a   Depression 06/2022   GERD (gastroesophageal reflux disease)    H/O colonoscopy    H/O endoscopy    Headache    Hypertension    Liver cirrhosis (HCC)    Neuropathy    OSA (obstructive sleep apnea)    not currently using CPAP   S/P abdominal paracentesis     Past Surgical History:  Procedure Laterality Date    BIOPSY  09/12/2019   Procedure: BIOPSY;  Surgeon: Corbin Ade, MD;  [facility]: [facility];  Service: Endoscopy;;   COLONOSCOPY N/A 09/18/2019   Dr. Darrick Penna: 12 colon polyps removed, multiple simple adenomas and some inflammatory polyps, diverticulosis, external and internal hemorrhoids.  Next colonoscopy in 3 years.   COLONOSCOPY WITH PROPOFOL N/A 03/24/2022   Procedure: COLONOSCOPY WITH PROPOFOL;  Surgeon: Lanelle Bal, DO;  Location: [facility];  Service: Endoscopy;  Laterality: N/A;  1:30pm, asa 3   ESOPHAGEAL BANDING N/A 09/12/2019   Procedure: ESOPHAGEAL BANDING;  Surgeon: Corbin Ade, MD;  [facility]: [facility];  Service: Endoscopy;  Laterality: N/A;   ESOPHAGOGASTRODUODENOSCOPY N/A 09/12/2019   Dr. Jena Gauss: Mild erosive reflux esophagitis.  Schatzki ring status post dilation.  Small grade 1/grade 2 esophageal varices, portal hypertensive gastropathy, hyperplastic gastric polyp, gastric erosions with chronic inactive gastritis on biopsies, no H. pylori.   ESOPHAGOGASTRODUODENOSCOPY (EGD) WITH PROPOFOL N/A 12/16/2020   two short columns of no more than Grade 2 esophageal varices, appearing innocent. Grade 1 varices not apparent today. Multiple large posterior body and antral hyperplastic, hemorrhagic appearing polyps, larges approximately 2.5 cm pedunculated. Portal gastropathy. Normal duodenum. Polypectomy with clips placed. EGD in 18 months.   ESOPHAGOGASTRODUODENOSCOPY (EGD) WITH PROPOFOL N/A 03/24/2022   Procedure: ESOPHAGOGASTRODUODENOSCOPY (EGD) WITH PROPOFOL;  Surgeon: Lanelle Bal, DO;  [facility];  Service: Endoscopy;  Laterality: N/A;   IR ANGIOGRAM SELECTIVE EACH ADDITIONAL VESSEL  09/04/2022   IR EMBO VENOUS NOT HEMORR HEMANG  INC GUIDE ROADMAPPING  09/04/2022   IR INTRAVASCULAR ULTRASOUND NON CORONARY  09/04/2022   IR PARACENTESIS  09/04/2022   IR RADIOLOGIST EVAL & MGMT  07/19/2022   IR RADIOLOGIST EVAL & MGMT  08/14/2022   IR RADIOLOGIST EVAL & MGMT   09/29/2022   IR RADIOLOGIST EVAL & MGMT  10/31/2022   IR RADIOLOGIST EVAL & MGMT  11/20/2022   IR RADIOLOGIST EVAL & MGMT  02/21/2023   IR TIPS  09/04/2022   IR US GUIDE VASC ACCESS RIGHT  09/04/2022   POLYPECTOMY  09/18/2019   Procedure: POLYPECTOMY;  Surgeon: West Bali, MD;  [facility]: [facility];  Service: Endoscopy;;   POLYPECTOMY  12/16/2020   Procedure: POLYPECTOMY;  Surgeon: Corbin Ade, MD;  [facility]: [facility];  Service: Endoscopy;;  gastric   POLYPECTOMY  03/24/2022   Procedure: POLYPECTOMY;  Surgeon: Lanelle Bal, DO;  [facility]: [facility];  Service: Endoscopy;;   RADIOLOGY WITH ANESTHESIA N/A 09/04/2022   Procedure: TIPS;  Surgeon: Bennie Dallas, MD;  Location: [facility];  Service: Radiology;  Laterality: N/A;    Current Outpatient Medications  Medication Sig Dispense Refill   Cholecalciferol (VITAMIN D3) 125 MCG (5000 UT) TABS Take 5,000 Units by mouth in the morning.     ciprofloxacin (CIPRO) 500 MG tablet TAKE 1 TABLET BY MOUTH DAILY 30 tablet 5   cyclobenzaprine (FLEXERIL) 10 MG tablet Take 10 mg by mouth 3 (three) times daily as needed for muscle spasms.     dicyclomine (BENTYL) 10  MG capsule TAKE 1 CAPSULE BY MOUTH DAILY BEFORE BREAKFAST 30 capsule 3   DULoxetine (CYMBALTA) 60 MG capsule Take 120 mg by mouth daily.     FEROSUL 325 (65 Fe) MG tablet Take 325 mg by mouth 2 (two) times daily.     furosemide (LASIX) 40 MG tablet Take 20 mg by mouth 2 (two) times daily.     gabapentin (NEURONTIN) 300 MG capsule Take 300 mg by mouth 3 (three) times daily as needed (pain.).     lactulose (CHRONULAC) 10 GM/15ML solution Take 60 mLs (40 g total) by mouth 3 (three) times daily. Titrate to goal of 3-4 BMs daily. 946 mL 11   levOCARNitine (CARNITOR) 330 MG tablet Take 330 mg by mouth 3 (three) times daily.     Multiple Vitamin (MULTIVITAMIN) tablet Take 1 tablet by mouth in the morning.     ondansetron (ZOFRAN) 4 MG tablet TAKE 1 TABLET BY MOUTH THREE TIMES DAILY BEFORE  MEALS 90 tablet 3   pantoprazole (PROTONIX) 40 MG tablet Take 1 tablet (40 mg total) by mouth 2 (two) times daily before a meal. 60 tablet 5   spironolactone (ALDACTONE) 100 MG tablet Take 100 mg by mouth daily.     XIFAXAN 550 MG TABS tablet TAKE 1 TABLET BY MOUTH TWICE DAILY 180 tablet 3   Zinc 50 MG TABS Take 50 mg by mouth in the morning.     No current facility-administered medications for this visit.    Allergies as of 03/28/2023 - Review Complete 02/02/2023  Allergen Reaction Noted   Chlorthalidone  11/04/2019   Metoprolol  11/04/2019    Family History  Problem Relation Age of Onset   Brain cancer Mother    Diabetes Mother    Diabetes Father    Pulmonary embolism Brother    Liver disease Neg Hx     Social History   Socioeconomic History   Marital status: Single    Spouse name: Not on file   Number of children: Not on file   Years of education: Not on file   Highest education level: Not on file  Occupational History   Occupation: umemployed  Tobacco Use   Smoking status: Never    Passive exposure: Current   Smokeless tobacco: Never  Vaping Use   Vaping status: Never Used  Substance and Sexual Activity   Alcohol use: Not Currently   Drug use: Never   Sexual activity: Not Currently  Other Topics Concern   Not on file  Social History Narrative   Not on file   Social Determinants of Health   Financial Resource Strain: Not on file  Food Insecurity: Low Risk  (05/22/2022)   [facility]   Hunger Vital Sign    Worried About Running Out of Food in the Last Year: Never true    Ran Out of Food in the Last Year: Never true  Transportation Needs: Not on file (05/22/2022)  Physical Activity: Not on file  Stress: Not on file  Social Connections: Not on file    Review of Systems: Gen: Denies fever, chills, anorexia. Denies fatigue, weakness, weight loss.  CV: Denies chest pain, palpitations, syncope, peripheral edema, and  claudication. Resp: Denies dyspnea at rest, cough, wheezing, coughing up blood, and pleurisy. GI: Denies vomiting blood, jaundice, and fecal incontinence.   Denies dysphagia or odynophagia. Derm: Denies rash, itching, dry skin Psych: Denies depression, anxiety, memory loss, confusion. No homicidal or suicidal ideation.  Heme: Denies bruising,  bleeding, and enlarged lymph nodes.  Physical Exam: There were no vitals taken for this visit. General:   Alert and oriented. No distress noted. Pleasant and cooperative.  Head:  Normocephalic and atraumatic. Eyes:  Conjuctiva clear without scleral icterus. Heart:  S1, S2 present without murmurs appreciated. Lungs:  Clear to auscultation bilaterally. No wheezes, rales, or rhonchi. No distress.  Abdomen:  +BS, soft, non-tender and non-distended. No rebound or guarding. No HSM or masses noted. Msk:  Symmetrical without gross deformities. Normal posture. Extremities:  Without edema. Neurologic:  Alert and  oriented x4 Psych:  Normal mood and affect.    Assessment:     Plan:  ***   [name], PA-C [facility] 03/28/2023

## 2023-03-27 ENCOUNTER — Encounter: Payer: Self-pay | Admitting: Hematology

## 2023-03-28 ENCOUNTER — Telehealth: Payer: Self-pay | Admitting: *Deleted

## 2023-03-28 ENCOUNTER — Ambulatory Visit: Payer: Medicaid Other | Admitting: Gastroenterology

## 2023-03-28 ENCOUNTER — Telehealth: Payer: Self-pay

## 2023-03-28 ENCOUNTER — Inpatient Hospital Stay: Payer: Medicaid Other

## 2023-03-28 NOTE — Telephone Encounter (Signed)
14:49 Called to check on patient pertaining to no show for iron appointment today. Unable to reach patient. Call back number left and message left on patient's voice mail/ Cell phone.

## 2023-03-28 NOTE — Telephone Encounter (Signed)
Spoke to pt, informed him of recommendations. Pt voiced understanding. Pt states he has to wait for transportation to get him to his appointments.

## 2023-03-28 NOTE — Telephone Encounter (Signed)
Patient missed his appointment this morning. I will need to see him in the office to assess him in order to make any recommendations on his swelling. He is rescheduled to  11/7. Not sure if this was the soonest he could come back. I have plenty of openings to see him sooner.   Until I see him, he will need to be sure he is taking his diuretics as prescribed (Lasix 40 mg BID and spironolactone 100 mg daily) and following a strict low sodium diet with no more than 2000 mcg of sodium daily.   Regarding his pain and use of gabapentin/flexeril, he is taking much more than is prescribed. He should only be taking what has been prescribed and needs to discuss this with his primary care doctor or whomever has prescribed these medications for him.   If he is in severe pain or has severe swelling, he should be seen in the ER.

## 2023-03-28 NOTE — Telephone Encounter (Signed)
Pt called and states his legs are the size of pineapples. He states he is in a lot of pain, because in fell off the porch. I asked was his abdomen swollen. He states a little bit. He states he has been taking 10 gabapentin a day and up to 6 flexeril for the pain in his back and legs. I advised him to go to ED and call his PCP to be checked out.

## 2023-03-30 NOTE — Progress Notes (Unsigned)
Referring Provider: Reather Converse, PA-C Primary Care Physician:  Reather Converse, PA-C Primary GI Physician: Dr. Jena Gauss  Chief Complaint  Patient presents with   Follow-up    Follow up. Legs swollen and backs hurting. Can't sleep.     HPI:   Alejandro Porter is a 47 y.o. male presenting today for follow-up of cirrhosis.    He has hstory of cirrhosis complicated by portal hypertension, esophageal varices, portal vein thrombosis previously on Eliquis (discontinued 09/2022), hepatic encephalopathy on lactulose and Xfiaxan, ascites requiring repeat paras, history of SBP in April 2022 , liver lesions noted on MRI November 2023.  He developed worsening ascites requiring increasingly frequent paracentesis with inability to titrate diuretics due to worsening renal funtion, hyponatremia, and ultimately underwent TIPS procedure 09/04/22. He was undergoing liver transplant eval at [facility], but this was closed as it was felt he was not a good candidate secondary to the lack of support though he is still trying to work on this.    Also with history of biliary dyskinesia but not a surgical candidate, GERD, IDA followed by Hematology.    Last seen in the office 01/01/23.   Reported ongoing back pain that had started after a fall and had not followed up with PCP on this. Still with chest pain and had not seen cardiology. He had no ascites or peripheral edema on Lasix 20 mg BID and spironolactone 100 mg daily. No HE, but still forgetting to take lactulose at times and occasionally skipping a day without a BM. Chronic abdominal pain more tolerable with dicyclomine daily. Nausea under control but taking 3 Zoftan every morning. No GERD on pantoprazole 40 mg daily. Plan included increase pantoprazole to 40 mg BID to see if this would help nausea, only take 1 zofran every 8 hours as needed, continue dicyclomine daily, continue cirrhosis medications, arrange MRI liver protocol, see cardiology for chest pain,  see PCP for back pain   MRI abdomen w/w/o contrast 01/19/23  Exam significantly limited by breath motion artifact.  No arterially hyperenhancing lesions identified in the liver. Previously described LI-RADS 3 lesions in the right lobe of the liver and adjacent to the intrahepatic IVC are not clearly appreciated on today's examination. Right hepatic vein TIPS, patency not well assessed by MR due to susceptibility artifact. Small volume perihepatic ascites.   He did have an office visit with cardiology 01/10/2023.   Chest pain was felt to be atypical and may in fact be more inflammatory in etiology.  Recommended updating echocardiogram due to cardiomegaly on recent chest CT.   No showed for echo scheduled for 8/17.      Today: [name] backwards off the porch steps because he lost his footing about 8-9 days ago. Having severe back pain radiating to his legs. Can't sleep. Can't stand for long time.  Lives alone. Older brother comes in on the weekends.  Lupita Leash, cousin, helps when she can.  Has neighbors that come in and check on him.  Not interested in assisted living sort of situation. Doesn't want to leave his house that his dad left him.  Struggling emotionally to some degree since losing his dad earlier this year.  He is seeing a therapist.  Chest is hurting him too. Chronic, but seems to be worse since he fell. Constant.  Has been taking 2 extra strength tylenol. Out of gabapentin.  Seeing PCP today at 9:30.   Cirrhosis:     MELD 3.0: 13 on 11/29/2022. Most recent labs 01/18/2023  with total bilirubin increased to 2.8, AST 49, alk phos 154, AST 32.Using INR from June, MELD 3.0 increases to 17.    Imaging:  MRI liver protocol 01/19/23.  Exam significantly limited due to breath motion artifact, but no arterially hyperenhancing lesions.  Consider triphasic CT for next imaging. AFP: 2.2 in October 2023. Hep A/B vaccination: Complete.  EGD:  Oct 2023; small esophageal varices, portal gastropathy,  multiple gastric polyps s/p resection and retrieval (hyperplastic) duodenitis. Recommended 1 year surveillance.  BB: Contraindicated with history of SBP.  Ascites/peripheral edema: Intermittent left lower extremity edema. No abdominal distension.  Diuretics: Lasix 40 mg BID; Spironolactone 100 mg daily.  Last Paracentesis: At time of TIPS 09/04/22 with 7.8 L removed.  History of SBP: Yes. On cipro 500 mg daily.  Encephalopathy:   Taking Xifaxan and lactulose 60-75 ml 1-2 times a day. Usually having 3-4 Bms daily. Occasionally skipping a day.  No brbpr or melena.  No mental status changes. Chronically forgetful, but no change.    Colonoscopy attempted in October 2023 which revealed non-bleeding internal hemorrhoids and rectal varices, but his prep was poor, so he was advised to return in 1 year for surveillance.   Past Medical History:  Diagnosis Date   Anxiety 08/2022   CKD (chronic kidney disease) stage 2, GFR 60-89 ml/min 05/24/2022   stage 3a   Depression 06/2022   GERD (gastroesophageal reflux disease)    H/O colonoscopy    H/O endoscopy    Headache    Hypertension    Liver cirrhosis (HCC)    Neuropathy    OSA (obstructive sleep apnea)    not currently using CPAP   S/P abdominal paracentesis     Past Surgical History:  Procedure Laterality Date   BIOPSY  09/12/2019   Procedure: BIOPSY;  Surgeon: Corbin Ade, MD;  Location: [facility];  Service: Endoscopy;;   COLONOSCOPY N/A 09/18/2019   Dr. Darrick Penna: 12 colon polyps removed, multiple simple adenomas and some inflammatory polyps, diverticulosis, external and internal hemorrhoids.  Next colonoscopy in 3 years.   COLONOSCOPY WITH PROPOFOL N/A 03/24/2022   Procedure: COLONOSCOPY WITH PROPOFOL;  Surgeon: Lanelle Bal, DO;  Location: [facility];  Service: Endoscopy;  Laterality: N/A;  1:30pm, asa 3   ESOPHAGEAL BANDING N/A 09/12/2019   Procedure: ESOPHAGEAL BANDING;  Surgeon: Corbin Ade, MD;  [facility];  Service: Endoscopy;  Laterality: N/A;   ESOPHAGOGASTRODUODENOSCOPY N/A 09/12/2019   Dr. Jena Gauss: Mild erosive reflux esophagitis.  Schatzki ring status post dilation.  Small grade 1/grade 2 esophageal varices, portal hypertensive gastropathy, hyperplastic gastric polyp, gastric erosions with chronic inactive gastritis on biopsies, no H. pylori.   ESOPHAGOGASTRODUODENOSCOPY (EGD) WITH PROPOFOL N/A 12/16/2020   two short columns of no more than Grade 2 esophageal varices, appearing innocent. Grade 1 varices not apparent today. Multiple large posterior body and antral hyperplastic, hemorrhagic appearing polyps, larges approximately 2.5 cm pedunculated. Portal gastropathy. Normal duodenum. Polypectomy with clips placed. EGD in 18 months.   ESOPHAGOGASTRODUODENOSCOPY (EGD) WITH PROPOFOL N/A 03/24/2022   Procedure: ESOPHAGOGASTRODUODENOSCOPY (EGD) WITH PROPOFOL;  Surgeon: Lanelle Bal, DO;  [facility]: [facility];  Service: Endoscopy;  Laterality: N/A;   IR ANGIOGRAM SELECTIVE EACH ADDITIONAL VESSEL  09/04/2022   IR EMBO VENOUS NOT HEMORR HEMANG  INC GUIDE ROADMAPPING  09/04/2022   IR INTRAVASCULAR ULTRASOUND NON CORONARY  09/04/2022   IR PARACENTESIS  09/04/2022   IR RADIOLOGIST EVAL & MGMT  07/19/2022   IR RADIOLOGIST  EVAL & MGMT  08/14/2022   IR RADIOLOGIST EVAL & MGMT  09/29/2022   IR RADIOLOGIST EVAL & MGMT  10/31/2022   IR RADIOLOGIST EVAL & MGMT  11/20/2022   IR RADIOLOGIST EVAL & MGMT  02/21/2023   IR TIPS  09/04/2022   IR US GUIDE VASC ACCESS RIGHT  09/04/2022   POLYPECTOMY  09/18/2019   Procedure: POLYPECTOMY;  Surgeon: West Bali, MD;  [facility]: [facility];  Service: Endoscopy;;   POLYPECTOMY  12/16/2020   Procedure: POLYPECTOMY;  Surgeon: Corbin Ade, MD;  [facility];  Service: Endoscopy;;  gastric   POLYPECTOMY  03/24/2022   Procedure: POLYPECTOMY;  Surgeon: Lanelle Bal, DO;  [facility]: [facility];  Service: Endoscopy;;   RADIOLOGY WITH ANESTHESIA N/A  09/04/2022   Procedure: TIPS;  Surgeon: Bennie Dallas, MD;  Location: [facility];  Service: Radiology;  Laterality: N/A;    Current Outpatient Medications  Medication Sig Dispense Refill   Cholecalciferol (VITAMIN D3) 125 MCG (5000 UT) TABS Take 5,000 Units by mouth in the morning.     ciprofloxacin (CIPRO) 500 MG tablet TAKE 1 TABLET BY MOUTH DAILY 30 tablet 5   cyclobenzaprine (FLEXERIL) 10 MG tablet Take 10 mg by mouth 3 (three) times daily as needed for muscle spasms.     dicyclomine (BENTYL) 10 MG capsule TAKE 1 CAPSULE BY MOUTH DAILY BEFORE BREAKFAST 30 capsule 3   DULoxetine (CYMBALTA) 60 MG capsule Take 120 mg by mouth daily.     FEROSUL 325 (65 Fe) MG tablet Take 325 mg by mouth 2 (two) times daily.     furosemide (LASIX) 40 MG tablet Take 20 mg by mouth 2 (two) times daily.     gabapentin (NEURONTIN) 300 MG capsule Take 300 mg by mouth 3 (three) times daily as needed (pain.).     lactulose (CHRONULAC) 10 GM/15ML solution Take 60 mLs (40 g total) by mouth 3 (three) times daily. Titrate to goal of 3-4 BMs daily. 946 mL 11   levOCARNitine (CARNITOR) 330 MG tablet Take 330 mg by mouth 3 (three) times daily.     Multiple Vitamin (MULTIVITAMIN) tablet Take 1 tablet by mouth in the morning.     ondansetron (ZOFRAN) 4 MG tablet TAKE 1 TABLET BY MOUTH THREE TIMES DAILY BEFORE MEALS 90 tablet 3   pantoprazole (PROTONIX) 40 MG tablet Take 1 tablet (40 mg total) by mouth 2 (two) times daily before a meal. 60 tablet 5   spironolactone (ALDACTONE) 100 MG tablet Take 100 mg by mouth daily.     XIFAXAN 550 MG TABS tablet TAKE 1 TABLET BY MOUTH TWICE DAILY 180 tablet 3   Zinc 50 MG TABS Take 50 mg by mouth in the morning.     No current facility-administered medications for this visit.    Allergies as of 04/02/2023 - Review Complete 04/02/2023  Allergen Reaction Noted   Chlorthalidone  11/04/2019   Metoprolol  11/04/2019    Family History  Problem Relation Age of Onset   Brain cancer Mother     Diabetes Mother    Diabetes Father    Pulmonary embolism Brother    Liver disease Neg Hx     Social History   Socioeconomic History   Marital status: Single    Spouse name: Not on file   Number of children: Not on file   Years of education: Not on file   Highest education level: Not on file  Occupational History   Occupation:  umemployed  Tobacco Use   Smoking status: Never    Passive exposure: Current   Smokeless tobacco: Never  Vaping Use   Vaping status: Never Used  Substance and Sexual Activity   Alcohol use: Not Currently   Drug use: Never   Sexual activity: Not Currently  Other Topics Concern   Not on file  Social History Narrative   Not on file   Social Determinants of Health   Financial Resource Strain: Not on file  Food Insecurity: Low Risk  (05/22/2022)   [facility]   Hunger Vital Sign    Worried About Running Out of Food in the Last Year: Never true    Ran Out of Food in the Last Year: Never true  Transportation Needs: Not on file (05/22/2022)  Physical Activity: Not on file  Stress: Not on file  Social Connections: Not on file    Review of Systems: Gen: Denies fever, chills, flulike symptoms, presyncope, syncope. CV: Denies heart palpitations. Resp: Denies dyspnea, cough. GI: See HPI. Heme: See HPI.  Physical Exam: BP 108/80 (BP Location: Right Arm, Patient Position: Sitting, Cuff Size: Large)   Pulse (!) 101   Temp 98 F (36.7 C) (Temporal)   Ht 5\' 8"  (1.727 m)   Wt 229 lb 12.8 oz (104.2 kg)   SpO2 97%   BMI 34.94 kg/m  General:   Alert and oriented.  Moaning in pain every now and then.  Shaky with known tremor. Head:  Normocephalic and atraumatic. Eyes:  Conjuctiva clear without scleral icterus. Heart:  S1, S2 present without murmurs appreciated. Lungs:  Clear to auscultation bilaterally. No wheezes, rales, or rhonchi. No distress.  Abdomen:  +BS, soft,  and non-distended.  Mild to moderate TTP in RUQ  region.  No rebound or guarding.  Msk:  Symmetrical without gross deformities.  Extremities:  Without edema. Neurologic:  Alert and  oriented x4.  Unable to assess for asterixis due to baseline tremor. Psych:  Normal mood and affect.    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 Also reporting back pain which was the focus of patient's concerns today and ongoing chest pain.   Back pain:  Acute on chronic back pain radiating to his legs after sustaining a fall from his porch 8 or 9 days ago due to losing his footing when going up the stairs.  He was not seen in the ER/urgent care and has not followed up with anyone on this.  He does have an appointment with his primary care doctor today and was encouraged to discuss this with him as he likely needs imaging.  Chest pain:  Ongoing.  Some worsening since fall 8 or 9 days ago.  Patient did follow-up with cardiology on 01/10/2023 for chest pain.  At that time, it was felt that his chest pain was atypical and possibly more inflammatory in etiology though they recommended echocardiogram due to cardiomegaly noted on recent chest CT.  He no showed for echocardiogram on 8/17.  He was advised to reach out to cardiology to get this rescheduled ASAP.  Also advised to discuss with PCP today at his scheduled office visit.  Notably, patient denies any heartburn/postprandial symptoms on pantoprazole twice daily.  MASH Cirrhosis:  Decompensated disease. Complicated by portal hypertension, esophageal varices, portal vein thrombosis previously on  Eliquis (discontinued 09/2022), HE, SBP April 2022 on cipro for prophylaxis, refractory ascites s/p TIPS 09/04/2022. He is not a transplant candidate due to lack of social support. MELD 3.0 13 in June, but up to 17  using most recent CMP in August.   No overt HE on exam today. He is compliant with Xifaxan twice daily, but is occasionally forgetting to take lactulose and sometimes skipping a day without a BM. We discussed the importance of medication compliance as well as not skipping days without a bowel movement again today.    No ascites on exam today. He does have 1+ LE edema of LLE. Unclear why he has asymmetric swelling. [place] was negative for DVT in June 2024 when swelling was much worse. He is maintaining on Lasix 40 mg twice daily and spironolactone 100 mg daily.  We will continue this regimen.  Due for surveillance EGD as he was found to have small esophageal varices, portal gastropathy on last EGD in October 2023 with recommendations for 1 year surveillance.  However, this has been on hold until echocardiogram has been completed as ordered by cardiology due to reports of chest pain.  Hep A/B vaccination complete   Liver lesion:  LI-RADS 3 liver lesions measuring 1.9 cm in the superior peripheral right liver and 2.5 cm along the anterior intrahepatic IVC noted on MRI in November 2023.  Surveillance MRI 01/19/2023 with exam significantly limited by breath motion artifact, no arterially hyperenhancing lesions identified in the liver, previously described Li-RADS 3 lesions not clearly appreciated on this exam. Can consider triphasic CT in February.   IDA:  Following with hematology, receiving IV iron as needed.  Most recent Hgb stable at 11.8 on 01/18/2023.  Denies overt GI bleeding.  Anemia likely related to occult GI blood loss in the setting of portal gastropathy, but he has not had a complete colonoscopy. Colonoscopy attempted in October 2023 which revealed non-bleeding internal hemorrhoids and rectal varices, but his prep was poor, so he was advised to return in 1 year for surveillance. Currently procedures on hold pending echocardiogram ordered by cardiology due to reports of chest pain.   Plan:   Follow-up with PCP on back pain and chest pain at scheduled OV today.  Reach out to cardiology to reschedule missed echocardiogram and discuss ongoing chest pain.  Continue lactulose 60 ml at least twice daily. Can take a 3rd dose if not having at least 2-3 Bms daily.  Continue Xifaxan 5 or 50 mg twice daily. Continue Cipro 500 mg daily. Continue Lasix 40 mg twice daily and spironolactone 100 mg daily. Cirrhosis Nutrition Recommendations:  High-protein diet from a primarily plant-based diet. Avoid red meat.  No raw or undercooked meat, seafood, or shellfish. Low-fat/cholesterol/carbohydrate diet. Limit sodium to no more than 2000 mg/day including everything that you eat and drink. Recommend at least 30 minutes of aerobic and resistance exercise 3 days/week. Needs EGD and colonoscopy once echocardiogram has been completed as ordered by cardiology for chest pain. Follow-up in 3 months or sooner if needed.   [name], PA-C [facility] 04/02/2023

## 2023-04-02 ENCOUNTER — Encounter: Payer: Self-pay | Admitting: Gastroenterology

## 2023-04-02 ENCOUNTER — Ambulatory Visit: Payer: Medicaid Other | Admitting: Gastroenterology

## 2023-04-02 VITALS — BP 108/80 | HR 101 | Temp 98.0°F | Ht 68.0 in | Wt 229.8 lb

## 2023-04-02 DIAGNOSIS — D509 Iron deficiency anemia, unspecified: Secondary | ICD-10-CM

## 2023-04-02 DIAGNOSIS — M545 Low back pain, unspecified: Secondary | ICD-10-CM

## 2023-04-02 DIAGNOSIS — M549 Dorsalgia, unspecified: Secondary | ICD-10-CM | POA: Diagnosis not present

## 2023-04-02 DIAGNOSIS — K3189 Other diseases of stomach and duodenum: Secondary | ICD-10-CM

## 2023-04-02 DIAGNOSIS — R079 Chest pain, unspecified: Secondary | ICD-10-CM

## 2023-04-02 DIAGNOSIS — K7581 Nonalcoholic steatohepatitis (NASH): Secondary | ICD-10-CM | POA: Diagnosis not present

## 2023-04-02 DIAGNOSIS — K7460 Unspecified cirrhosis of liver: Secondary | ICD-10-CM

## 2023-04-02 DIAGNOSIS — I8500 Esophageal varices without bleeding: Secondary | ICD-10-CM

## 2023-04-02 DIAGNOSIS — K769 Liver disease, unspecified: Secondary | ICD-10-CM

## 2023-04-02 DIAGNOSIS — K766 Portal hypertension: Secondary | ICD-10-CM

## 2023-04-02 NOTE — Patient Instructions (Addendum)
Please discuss your back and chest pain with your primary care doctor today.   You need to get your echocardiogram completed. Please call your cardiologist to get this rescheduled.   Continue taking your current medications.   You need to be sure you are taking lactulose 60 ml at least twice daily every day to ensure you are having 2-3 Bms every day. It is very important that you do not skip a day without a BM. If you haven't had a BM my lunch time, go ahead and take your second dose. If you still haven't had a BM by the evening, take a third dose.   Nutrition:  High-protein diet from a primarily plant-based diet. Avoid red meat.  No raw or undercooked meat, seafood, or shellfish. Low-fat/cholesterol/carbohydrate diet. Limit sodium to no more than 2000 mg/day including everything that you eat and drink. Recommend at least 30 minutes of aerobic and resistance exercise 3 days/week.  I will plan to see you back in 3 months. We need to get you scheduled for an upper endoscopy and colonoscopy, but you need to have your echocardiogram completed prior to this.  [name], PA-C [facility]

## 2023-04-04 ENCOUNTER — Ambulatory Visit: Payer: Medicaid Other | Admitting: Pulmonary Disease

## 2023-04-05 ENCOUNTER — Ambulatory Visit (HOSPITAL_COMMUNITY): Payer: Medicaid Other | Admitting: Clinical

## 2023-04-05 ENCOUNTER — Encounter: Payer: Self-pay | Admitting: Adult Health

## 2023-04-05 ENCOUNTER — Ambulatory Visit: Payer: Medicaid Other | Admitting: Adult Health

## 2023-04-09 ENCOUNTER — Inpatient Hospital Stay: Payer: Medicaid Other | Attending: Hematology

## 2023-04-10 ENCOUNTER — Inpatient Hospital Stay (HOSPITAL_COMMUNITY)
Admission: EM | Admit: 2023-04-10 | Discharge: 2023-04-14 | DRG: 074 | Disposition: A | Discharge status: Home Health | Payer: Medicaid Other | Attending: Internal Medicine | Admitting: Internal Medicine

## 2023-04-10 ENCOUNTER — Other Ambulatory Visit: Payer: Self-pay

## 2023-04-10 ENCOUNTER — Emergency Department (HOSPITAL_COMMUNITY): Payer: Medicaid Other

## 2023-04-10 ENCOUNTER — Ambulatory Visit (HOSPITAL_COMMUNITY): Admission: RE | Admit: 2023-04-10 | Payer: Medicaid Other | Source: Ambulatory Visit

## 2023-04-10 ENCOUNTER — Encounter (HOSPITAL_COMMUNITY): Payer: Self-pay | Admitting: *Deleted

## 2023-04-10 DIAGNOSIS — K7460 Unspecified cirrhosis of liver: Secondary | ICD-10-CM | POA: Diagnosis present

## 2023-04-10 DIAGNOSIS — M4804 Spinal stenosis, thoracic region: Secondary | ICD-10-CM | POA: Diagnosis present

## 2023-04-10 DIAGNOSIS — F32A Depression, unspecified: Secondary | ICD-10-CM | POA: Diagnosis present

## 2023-04-10 DIAGNOSIS — E8770 Fluid overload, unspecified: Secondary | ICD-10-CM | POA: Diagnosis not present

## 2023-04-10 DIAGNOSIS — G4733 Obstructive sleep apnea (adult) (pediatric): Secondary | ICD-10-CM | POA: Diagnosis present

## 2023-04-10 DIAGNOSIS — E871 Hypo-osmolality and hyponatremia: Secondary | ICD-10-CM

## 2023-04-10 DIAGNOSIS — S32031A Stable burst fracture of third lumbar vertebra, initial encounter for closed fracture: Secondary | ICD-10-CM | POA: Diagnosis present

## 2023-04-10 DIAGNOSIS — Z888 Allergy status to other drugs, medicaments and biological substances status: Secondary | ICD-10-CM

## 2023-04-10 DIAGNOSIS — E8809 Other disorders of plasma-protein metabolism, not elsewhere classified: Secondary | ICD-10-CM

## 2023-04-10 DIAGNOSIS — M5441 Lumbago with sciatica, right side: Secondary | ICD-10-CM

## 2023-04-10 DIAGNOSIS — K219 Gastro-esophageal reflux disease without esophagitis: Secondary | ICD-10-CM | POA: Diagnosis present

## 2023-04-10 DIAGNOSIS — K7469 Other cirrhosis of liver: Secondary | ICD-10-CM

## 2023-04-10 DIAGNOSIS — N182 Chronic kidney disease, stage 2 (mild): Secondary | ICD-10-CM | POA: Diagnosis present

## 2023-04-10 DIAGNOSIS — M4854XA Collapsed vertebra, not elsewhere classified, thoracic region, initial encounter for fracture: Secondary | ICD-10-CM | POA: Diagnosis present

## 2023-04-10 DIAGNOSIS — E669 Obesity, unspecified: Secondary | ICD-10-CM | POA: Diagnosis not present

## 2023-04-10 DIAGNOSIS — E66811 Obesity, class 1: Secondary | ICD-10-CM | POA: Diagnosis present

## 2023-04-10 DIAGNOSIS — W19XXXA Unspecified fall, initial encounter: Secondary | ICD-10-CM

## 2023-04-10 DIAGNOSIS — G834 Cauda equina syndrome: Secondary | ICD-10-CM | POA: Diagnosis present

## 2023-04-10 DIAGNOSIS — R6520 Severe sepsis without septic shock: Secondary | ICD-10-CM | POA: Diagnosis present

## 2023-04-10 DIAGNOSIS — F419 Anxiety disorder, unspecified: Secondary | ICD-10-CM | POA: Diagnosis present

## 2023-04-10 DIAGNOSIS — Y92008 Other place in unspecified non-institutional (private) residence as the place of occurrence of the external cause: Secondary | ICD-10-CM

## 2023-04-10 DIAGNOSIS — S3210XA Unspecified fracture of sacrum, initial encounter for closed fracture: Secondary | ICD-10-CM | POA: Diagnosis present

## 2023-04-10 DIAGNOSIS — D696 Thrombocytopenia, unspecified: Secondary | ICD-10-CM | POA: Diagnosis present

## 2023-04-10 DIAGNOSIS — I129 Hypertensive chronic kidney disease with stage 1 through stage 4 chronic kidney disease, or unspecified chronic kidney disease: Secondary | ICD-10-CM | POA: Diagnosis present

## 2023-04-10 DIAGNOSIS — Z6832 Body mass index (BMI) 32.0-32.9, adult: Secondary | ICD-10-CM | POA: Diagnosis not present

## 2023-04-10 DIAGNOSIS — M5442 Lumbago with sciatica, left side: Secondary | ICD-10-CM | POA: Diagnosis not present

## 2023-04-10 DIAGNOSIS — Z79899 Other long term (current) drug therapy: Secondary | ICD-10-CM

## 2023-04-10 DIAGNOSIS — G629 Polyneuropathy, unspecified: Secondary | ICD-10-CM | POA: Diagnosis present

## 2023-04-10 DIAGNOSIS — R748 Abnormal levels of other serum enzymes: Secondary | ICD-10-CM | POA: Diagnosis not present

## 2023-04-10 DIAGNOSIS — M549 Dorsalgia, unspecified: Secondary | ICD-10-CM | POA: Diagnosis present

## 2023-04-10 DIAGNOSIS — E46 Unspecified protein-calorie malnutrition: Secondary | ICD-10-CM | POA: Diagnosis not present

## 2023-04-10 DIAGNOSIS — W109XXA Fall (on) (from) unspecified stairs and steps, initial encounter: Secondary | ICD-10-CM | POA: Diagnosis present

## 2023-04-10 DIAGNOSIS — R29898 Other symptoms and signs involving the musculoskeletal system: Secondary | ICD-10-CM | POA: Diagnosis not present

## 2023-04-10 DIAGNOSIS — Z833 Family history of diabetes mellitus: Secondary | ICD-10-CM | POA: Diagnosis not present

## 2023-04-10 DIAGNOSIS — D649 Anemia, unspecified: Secondary | ICD-10-CM | POA: Diagnosis present

## 2023-04-10 LAB — CBC WITH DIFFERENTIAL/PLATELET
Abs Immature Granulocytes: 0.03 10*3/uL (ref 0.00–0.07)
Basophils Absolute: 0 10*3/uL (ref 0.0–0.1)
Basophils Relative: 0 %
Eosinophils Absolute: 0.2 10*3/uL (ref 0.0–0.5)
Eosinophils Relative: 3 %
HCT: 38.3 % — ABNORMAL LOW (ref 39.0–52.0)
Hemoglobin: 12.8 g/dL — ABNORMAL LOW (ref 13.0–17.0)
Immature Granulocytes: 0 %
Lymphocytes Relative: 7 %
Lymphs Abs: 0.5 10*3/uL — ABNORMAL LOW (ref 0.7–4.0)
MCH: 33.3 pg (ref 26.0–34.0)
MCHC: 33.4 g/dL (ref 30.0–36.0)
MCV: 99.7 fL (ref 80.0–100.0)
Monocytes Absolute: 0.8 10*3/uL (ref 0.1–1.0)
Monocytes Relative: 11 %
Neutro Abs: 5.5 10*3/uL (ref 1.7–7.7)
Neutrophils Relative %: 79 %
Platelets: 126 10*3/uL — ABNORMAL LOW (ref 150–400)
RBC: 3.84 MIL/uL — ABNORMAL LOW (ref 4.22–5.81)
RDW: 14.8 % (ref 11.5–15.5)
WBC: 7.1 10*3/uL (ref 4.0–10.5)
nRBC: 0 % (ref 0.0–0.2)

## 2023-04-10 LAB — COMPREHENSIVE METABOLIC PANEL
ALT: 38 U/L (ref 0–44)
AST: 41 U/L (ref 15–41)
Albumin: 3.1 g/dL — ABNORMAL LOW (ref 3.5–5.0)
Alkaline Phosphatase: 233 U/L — ABNORMAL HIGH (ref 38–126)
Anion gap: 8 (ref 5–15)
BUN: 9 mg/dL (ref 6–20)
CO2: 23 mmol/L (ref 22–32)
Calcium: 8.8 mg/dL — ABNORMAL LOW (ref 8.9–10.3)
Chloride: 93 mmol/L — ABNORMAL LOW (ref 98–111)
Creatinine, Ser: 0.88 mg/dL (ref 0.61–1.24)
GFR, Estimated: >60 mL/min (ref >60)
Glucose, Bld: 132 mg/dL — ABNORMAL HIGH (ref 70–99)
Potassium: 4.2 mmol/L (ref 3.5–5.1)
Sodium: 124 mmol/L — ABNORMAL LOW (ref 135–145)
Total Bilirubin: 2.9 mg/dL — ABNORMAL HIGH (ref <1.2)
Total Protein: 6.5 g/dL (ref 6.5–8.1)

## 2023-04-10 MED ORDER — ONDANSETRON HCL 4 MG PO TABS: 4.0000 mg | ORAL_TABLET | Freq: Four times a day (QID) | ORAL | Status: DC | PRN

## 2023-04-10 MED ORDER — ONDANSETRON HCL 4 MG/2ML IJ SOLN
4.0000 mg | Freq: Four times a day (QID) | INTRAMUSCULAR | Status: DC | PRN
  Administered 2023-04-12 – 2023-04-14 (×4): 4 mg via INTRAVENOUS
  Filled 2023-04-10 (×4): qty 2

## 2023-04-10 MED ORDER — SODIUM CHLORIDE 0.9 % IV BOLUS
1000.0000 mL | Freq: Once | INTRAVENOUS | Status: AC
  Administered 2023-04-10: 1000 mL via INTRAVENOUS

## 2023-04-10 MED ORDER — ENOXAPARIN SODIUM 40 MG/0.4ML IJ SOSY
40.0000 mg | PREFILLED_SYRINGE | INTRAMUSCULAR | Status: DC
  Administered 2023-04-11: 40 mg via SUBCUTANEOUS
  Filled 2023-04-10: qty 0.4

## 2023-04-10 NOTE — ED Notes (Signed)
ED Provider at bedside. 

## 2023-04-10 NOTE — ED Triage Notes (Signed)
Pt states he fell off porch backwards about 9 days ago, c/o lower back and bilateral leg pain since.  Pt states he lost his balance.  Pt states he hit head on the ground and denies LOC.  Denies any blood thinners. C/o feet are numb and tingling to bilateral legs.

## 2023-04-10 NOTE — ED Provider Notes (Signed)
[place] EMERGENCY DEPARTMENT AT [facility] Provider Note   CSN: [id number] Arrival date & time: 04/10/23  1232     History  Chief Complaint  Patient presents with   Back Pain    Caison Hearn is a 47 y.o. male.   Back Pain Associated symptoms: numbness   Associated symptoms: no abdominal pain, no chest pain, no dysuria, no fever and no headaches        Layne Dilauro is a 47 y.o. male with past medical history of cirrhosis, portal hypertension, prior TIPS procedure, CKD who presents to the Emergency Department complaining of low back pain secondary to mechanical fall that occurred 9 days ago.  He states he missed a step off of his porch and fell backwards onto his back.  Since the fall, he has radiating pain into his bilateral upper legs.  He describes tingling and numbness to his lower extremities.  Also states that he struck his head on the ground when he fell but denies any neck pain headache or loss of consciousness.  No nausea or vomiting.  Does not take blood thinners.   Home Medications Prior to Admission medications   Medication Sig Start Date End Date Taking? Authorizing Provider  Cholecalciferol (VITAMIN D3) 125 MCG (5000 UT) TABS Take 5,000 Units by mouth in the morning. 06/17/19   [provider]  ciprofloxacin (CIPRO) 500 MG tablet TAKE 1 TABLET BY MOUTH DAILY 01/21/23   Letta Median, PA-C  cyclobenzaprine (FLEXERIL) 10 MG tablet Take 10 mg by mouth 3 (three) times daily as needed for muscle spasms. 09/29/20   [provider]  dicyclomine (BENTYL) 10 MG capsule TAKE 1 CAPSULE BY MOUTH DAILY BEFORE BREAKFAST 03/15/23   Letta Median, PA-C  DULoxetine (CYMBALTA) 60 MG capsule Take 120 mg by mouth daily. 05/03/22   [provider]  FEROSUL 325 (65 Fe) MG tablet Take 325 mg by mouth 2 (two) times daily. 09/26/22   [provider]  furosemide (LASIX) 40 MG tablet Take 20 mg by mouth 2 (two) times daily. 01/23/22    [provider]  gabapentin (NEURONTIN) 300 MG capsule Take 300 mg by mouth 3 (three) times daily as needed (pain.).    [provider]  lactulose (CHRONULAC) 10 GM/15ML solution Take 60 mLs (40 g total) by mouth 3 (three) times daily. Titrate to goal of 3-4 BMs daily. 09/18/22   Letta Median, PA-C  levOCARNitine (CARNITOR) 330 MG tablet Take 330 mg by mouth 3 (three) times daily. 09/01/20   [provider]  Multiple Vitamin (MULTIVITAMIN) tablet Take 1 tablet by mouth in the morning.    [provider]  ondansetron (ZOFRAN) 4 MG tablet TAKE 1 TABLET BY MOUTH THREE TIMES DAILY BEFORE MEALS 12/19/22   Letta Median, PA-C  pantoprazole (PROTONIX) 40 MG tablet Take 1 tablet (40 mg total) by mouth 2 (two) times daily before a meal. 01/07/23   Letta Median, PA-C  spironolactone (ALDACTONE) 100 MG tablet Take 100 mg by mouth daily.    [provider]  XIFAXAN 550 MG TABS tablet TAKE 1 TABLET BY MOUTH TWICE DAILY 09/11/22   Letta Median, PA-C  Zinc 50 MG TABS Take 50 mg by mouth in the morning.    [provider]      Allergies    Chlorthalidone and Metoprolol    Review of Systems   Review of Systems  Constitutional:  Negative for chills and fever.  Respiratory:  Negative for shortness of breath.   Cardiovascular:  Negative for chest pain.  Gastrointestinal:  Negative for abdominal pain, nausea and vomiting.  Genitourinary:  Negative for dysuria and flank pain.  Musculoskeletal:  Positive for back pain.  Neurological:  Positive for numbness. Negative for dizziness, syncope and headaches.    Physical Exam Updated Vital Signs BP 121/73 (BP Location: Right Arm)   Pulse (!) 106   Temp 98.8 F (37.1 C) (Oral)   Resp 20   Ht 5\' 8"  (1.727 m)   Wt 96.6 kg   SpO2 99%   BMI 32.39 kg/m  Physical Exam Vitals and nursing note reviewed.  Constitutional:      General: He is not in acute distress.    Appearance: Normal appearance.   Cardiovascular:     Rate and Rhythm: Normal rate and regular rhythm.     Pulses: Normal pulses.  Pulmonary:     Effort: Pulmonary effort is normal.  Chest:     Chest wall: No tenderness.  Abdominal:     Palpations: Abdomen is soft.     Tenderness: There is no abdominal tenderness.  Musculoskeletal:        General: Tenderness and signs of injury present.     Lumbar back: Signs of trauma and tenderness present. No deformity. Positive right straight leg raise test and positive left straight leg raise test.     Comments: Midline tenderness to palpation over spine.  Pain with bilateral straight leg raise  Skin:    General: Skin is warm.     Capillary Refill: Capillary refill takes less than 2 seconds.  Neurological:     Mental Status: He is alert.     ED Results / Procedures / Treatments   Labs (all labs ordered are listed, but only abnormal results are displayed) Labs Reviewed  COMPREHENSIVE METABOLIC PANEL - Abnormal; Notable for the following components:      Result Value   Sodium 124 (*)    Chloride 93 (*)    Glucose, Bld 132 (*)    Calcium 8.8 (*)    Albumin 3.1 (*)    Alkaline Phosphatase 233 (*)    Total Bilirubin 2.9 (*)    All other components within normal limits  CBC WITH DIFFERENTIAL/PLATELET - Abnormal; Notable for the following components:   RBC 3.84 (*)    Hemoglobin 12.8 (*)    HCT 38.3 (*)    Platelets 126 (*)    Lymphs Abs 0.5 (*)    All other components within normal limits    EKG None  Radiology MR LUMBAR SPINE WO CONTRAST  Result Date: 04/10/2023 CLINICAL DATA:  Ataxia, lumbar trauma fall, please include T12 deformity see on CT EXAM: MRI LUMBAR SPINE WITHOUT CONTRAST TECHNIQUE: Multiplanar, multisequence MR imaging of the lumbar spine was performed. No intravenous contrast was administered. COMPARISON:  Same day lumbar spine CT FINDINGS: Segmentation:  Standard. Alignment:  Trace retrolisthesis of L5 on S1. Vertebrae: There are acute to subacute  compression deformities at T10, T11, T12, L1 L3 with associated bone marrow edema. There is retropulsion of the L3 vertebral body with bulging of the posterior cortex that results in severe spinal canal stenosis with mass effect on the cauda equina roots. Fracture also involves the bilateral pedicles and the spinous process at L3. Abnormal T2 hyperintense signal is also present in the sacral ala on the left (series 7, image 14), raising the possibility for a left sacral fracture. Conus medullaris and cauda equina: Conus extends  to the L2 level. Conus and cauda equina appear normal. 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 Disc levels: T12-L1: Mild bilateral facet degenerative change. Mild spinal canal narrowing. Mild bilateral neural foraminal narrowing. Minimal disc bulge. L1-L2: Moderate bilateral facet degenerative change. Minimal disc bulge. Mild spinal canal narrowing. Mild bilateral neural foraminal narrowing. L2-L3: Moderate bilateral facet degenerative change. There is retropulsion of the posterior cortex of L3. Severe spinal canal narrowing. Moderate left and mild right neural foraminal narrowing. L3-L4: Retropulsion of the posterior cortex of L3. Severe spinal canal narrowing. Moderate bilateral neural foraminal narrowing. Mild bilateral facet degenerative change. L4-L5: Mild bilateral facet degenerative change. No significant disc bulge. Mild spinal canal narrowing. Moderate bilateral neural foraminal narrowing, left-greater-than-right. L5-S1: Moderate bilateral facet degenerative change. Eccentric left disc bulge. No spinal canal narrowing. Moderate to severe left and moderate right neural foraminal narrowing. IMPRESSION: 1. Acute to subacute compression deformities at T10, T11, T12, L1, and L3. Retropulsion of the posterior cortex of L3 results in severe spinal canal stenosis with mass effect on the cauda equina roots. 2. Abnormal T2 hyperintense signal is  also present in the sacral ala on the left, suspicious for a left sacral fracture. Consider further evaluation with a dedicated sacral MRI. 3. Multilevel degenerative changes of the lumbar spine, as described above. Electronically Signed   By: Lorenza Cambridge M.D.   On: 04/10/2023 19:48   CT Lumbar Spine Wo Contrast  Result Date: 04/10/2023 CLINICAL DATA:  Low back pain, fell off porch EXAM: CT LUMBAR SPINE WITHOUT CONTRAST TECHNIQUE: Multidetector CT imaging of the lumbar spine was performed without intravenous contrast administration. Multiplanar CT image reconstructions were also generated. RADIATION DOSE REDUCTION: This exam was performed according to the departmental dose-optimization program which includes automated exposure control, adjustment of the mA and/or kV according to patient size and/or use of iterative reconstruction technique. COMPARISON:  No prior CT of the lumbar spine available, correlation is made with 11/10/2022 CT abdomen pelvis and 01/19/2023 MRI abdomen FINDINGS: Segmentation: 5 lumbar type vertebral bodies. Alignment: Levocurvature of the lumbar spine. 3 mm anterolisthesis of T11 on T12 and 2 mm retrolisthesis of T12 on L1, in part secondary to retropulsion at T12 trace retrolisthesis of L3 on L4. Vertebrae: Osteopenia. Acute or acute on chronic burst fracture of L3, which is new from both the 11/09/2022 CT and 01/19/2023 MRI. Up to 80% vertebral body height loss centrally compared to 11/09/2022. 7 mm retropulsion of the posterosuperior cortex. The fracture extends into the right pedicle of L3 (series 11, image 23 and series 12, image 35). Compression deformity of T12 appears more chronic, although this is new from 11/09/2022 and has progressed compared to 01/19/2023, with up to 60% vertebral body height loss anteriorly. 5 mm of retropulsion posterior cortex, both superiorly and inferiorly. Unchanged compression deformity of L1-L2. No suspicious osseous lesions. Advanced endplate  degenerative changes at L5-S1. Paraspinal and other soft tissues: Left adrenal mild lipoma, better characterized on the prior MRI. Nonobstructing bilateral renal calculi. Aortic atherosclerosis. Disc levels: T11-T12: Retropulsion of the posterosuperior cortex of T12 contributes to mild spinal canal stenosis. Moderate facet arthropathy. No neural foraminal narrowing. T12-L1: Minimal disc bulge. Mild facet arthropathy. No spinal canal stenosis or neural foraminal narrowing. L1-L2: Mild disc bulge. Mild-to-moderate facet arthropathy. Mild spinal canal stenosis. Mild right neural foraminal narrowing. L2-L3: Retropulsion of posterosuperior cortex of L3 causes severe spinal canal stenosis, with likely compression of the cauda quinine nerve roots (series 10, image 57). Moderate  facet arthropathy. Moderate right and mild left neural foraminal narrowing. Neural foraminal narrowing. L3-L4: Minimal disc bulge. Moderate facet arthropathy. No spinal canal stenosis. Moderate bilateral neural foraminal narrowing. L4-L5: Mild disc bulge. Mild facet arthropathy. No spinal canal stenosis. Mild bilateral neural foraminal narrowing. L5-S1: Disc height loss and disc osteophyte complex. Mild-to-moderate facet arthropathy. No spinal canal stenosis. Mild left and moderate right neural foraminal narrowing. IMPRESSION: 1. Acute or acute on chronic burst fracture of L3, which is new from the most recent prior exams, with up to 80% vertebral body height loss centrally and 7 mm retropulsion of the posterosuperior cortex. This causes severe spinal canal stenosis, with likely compression of the cauda equina nerve roots. An MRI is recommended for further evaluation. 2. Compression deformity of T12 appears more chronic, although this has progressed from prior exams, with up to 60% vertebral body height loss anteriorly and 5 mm of retropulsion posterior cortex, both superiorly and inferiorly. No significant resulting spinal canal stenosis. 3. L2-L3  and L5-S1 moderate right and mild left neural foraminal narrowing. 4. L3-L4 moderate bilateral neural foraminal narrowing. 5. L4-L5 mild bilateral neural foraminal narrowing. These findings were discussed by telephone on 04/10/2023 at 5:48 pm with provider DIXON. Electronically Signed   By: Wiliam Ke M.D.   On: 04/10/2023 17:48   CT Head Wo Contrast  Result Date: 04/10/2023 CLINICAL DATA:  [name] off porch backwards about 9 days ago, pain EXAM: CT HEAD WITHOUT CONTRAST TECHNIQUE: Contiguous axial images were obtained from the base of the skull through the vertex without intravenous contrast. RADIATION DOSE REDUCTION: This exam was performed according to the departmental dose-optimization program which includes automated exposure control, adjustment of the mA and/or kV according to patient size and/or use of iterative reconstruction technique. COMPARISON:  10/11/2020 FINDINGS: Brain: No evidence of acute infarction, hemorrhage, mass, mass effect, or midline shift. No hydrocephalus or extra-axial fluid collection. Vascular: No hyperdense vessel. Skull: Negative for fracture or focal lesion. Sinuses/Orbits: Mucosal thickening in the ethmoid air cells. No acute finding in the orbits. Other: The mastoid air cells are well aerated. IMPRESSION: No acute intracranial process. Electronically Signed   By: Wiliam Ke M.D.   On: 04/10/2023 17:27    Procedures Procedures    Medications Ordered in ED Medications - No data to display  ED Course/ Medical Decision Making/ A&P                                 Medical Decision Making Patient here for low back pain lower extremity weakness numbness secondary to mechanical fall that occurred 9 days ago.  [name] off a porch landing on his back.  Struck his head but does not take blood thinners, no LOC.  Patient has midline tenderness of his lower lumbar spine pain with straight leg raise.  No incontinence or abdominal pain. Differential would include fracture, cauda  equina, compression deformity musculoskeletal injury  Amount and/or Complexity of Data Reviewed Labs: ordered.    Details: Labs interpreted by me no evidence of leukocytosis, hyponatremic with sodium of 124 alk phos elevated, patient does have history of cirrhosis Radiology: ordered.    Details: CT head without acute intracranial process, CT lumbar spine shows possible burst fracture L1 3 spinal canal stenosis with compression of the cauda equina nerve root.  MRI was recommended  MRI of the lumbar spine shows acute to subacute compression deformities of T10-T11 T12-L1 and L3.  Retropulsion severe  spinal canal stenosis with mass effect on the cauda equina nerve roots Discussion of management or test interpretation with external provider(s): Discussed MRI findings with neurosurgery, Dr. Vick Frees who recommends patient be transferred to [facility] admitted to medicine and she will consult in the morning   Discussed with [facility] hospitalist, Dr. Thomes Dinning who agrees to admit and arrange for transfer           Final Clinical Impression(s) / ED Diagnoses Final diagnoses:  Weakness of both lower extremities  Acute midline low back pain with bilateral sciatica  Fall, initial encounter    Rx / DC Orders ED Discharge Orders     None         Pauline Aus, PA-C 04/10/23 2300    Glendora Score, MD 04/11/23 0701

## 2023-04-10 NOTE — H&P (Signed)
History and Physical    Patient: Alejandro Porter WGN:[id number] DOB: 09/22/1975 DOA: 04/10/2023 DOS: the patient was seen and examined on 04/11/2023 PCP: Reather Converse, PA-C  Patient coming from: Home  Chief Complaint:  Chief Complaint  Patient presents with   Back Pain   HPI: Alejandro Porter is a 47 y.o. male with medical history significant of cirrhosis of liver, iron deficiency anemia, GERD, portal hypertension, prior TIPS procedure, CKD who presents to the emergency department due to 9-day onset of low back pain due to a fall sustained at home.  Patient states that he missed a step off his porch and fell backwards onto his back hitting his head.  Since the fall, he has had pain radiation from his waist to the knees bilaterally.  He complained of bilateral lower extremity numbness and tingling.  Patient denies chest pain, nausea, shortness of breath, vomiting.  ED Course:  In the emergency department, he was hemodynamically stable.  Workup in the ED showed normocytic anemia and thrombocytopenia.  BMP was normal except for sodium of 124, chloride 93, blood glucose 132, albumin 3.1, ALP 233, total bilirubin 2.9 CT head without contrast showed no acute intracranial process CT lumbar spine without contrast showed acute or acute on chronic burst fracture of L3, which is new from the most recent prior exams, with up to 80% vertebral body height loss centrally and 7 mm retropulsion of the posterosuperior cortex. This causes severe spinal canal stenosis, with likely compression of the cauda equina nerve roots. MRI lumbar spine without contrast showed acute to subacute compression deformities at T10, T11, T12, L1, and L3. Retropulsion of the posterior cortex of L3 results in severe spinal canal stenosis with mass effect on the cauda equina roots. Abnormal T2 hyperintense signal is also present in the sacral ala on the left, suspicious for a left sacral fracture.  IV NS 1 L was given.  Neurosurgery  (Cosentino, [place] Chad, [place]) was consulted and recommended transfer to [facility] in the medicine service with plan for neurosurgery team to consult on patient in the morning. Hospitalist was asked to admit patient for further evaluation and management.   Review of Systems: Review of systems as noted in the HPI. All other systems reviewed and are negative.   Past Medical History:  Diagnosis Date   Anxiety 08/2022   CKD (chronic kidney disease) stage 2, GFR 60-89 ml/min 05/24/2022   stage 3a   Depression 06/2022   GERD (gastroesophageal reflux disease)    H/O colonoscopy    H/O endoscopy    Headache    Hypertension    Liver cirrhosis (HCC)    Neuropathy    OSA (obstructive sleep apnea)    not currently using CPAP   S/P abdominal paracentesis    Past Surgical History:  Procedure Laterality Date   BIOPSY  09/12/2019   Procedure: BIOPSY;  Surgeon: Corbin Ade, MD;  [facility];  Service: Endoscopy;;   COLONOSCOPY N/A 09/18/2019   Dr. Darrick Penna: 12 colon polyps removed, multiple simple adenomas and some inflammatory polyps, diverticulosis, external and internal hemorrhoids.  Next colonoscopy in 3 years.   COLONOSCOPY WITH PROPOFOL N/A 03/24/2022   Procedure: COLONOSCOPY WITH PROPOFOL;  Surgeon: Lanelle Bal, DO;  [facility];  Service: Endoscopy;  Laterality: N/A;  1:30pm, asa 3   ESOPHAGEAL BANDING N/A 09/12/2019   Procedure: ESOPHAGEAL BANDING;  Surgeon: Corbin Ade, MD;  [facility];  Service: Endoscopy;  Laterality: N/A;   ESOPHAGOGASTRODUODENOSCOPY  N/A 09/12/2019   Dr. Jena Gauss: Mild erosive reflux esophagitis.  Schatzki ring status post dilation.  Small grade 1/grade 2 esophageal varices, portal hypertensive gastropathy, hyperplastic gastric polyp, gastric erosions with chronic inactive gastritis on biopsies, no H. pylori.   ESOPHAGOGASTRODUODENOSCOPY (EGD) WITH PROPOFOL N/A 12/16/2020   two short columns of no more than Grade 2 esophageal  varices, appearing innocent. Grade 1 varices not apparent today. Multiple large posterior body and antral hyperplastic, hemorrhagic appearing polyps, larges approximately 2.5 cm pedunculated. Portal gastropathy. Normal duodenum. Polypectomy with clips placed. EGD in 18 months.   ESOPHAGOGASTRODUODENOSCOPY (EGD) WITH PROPOFOL N/A 03/24/2022   Procedure: ESOPHAGOGASTRODUODENOSCOPY (EGD) WITH PROPOFOL;  Surgeon: Lanelle Bal, DO;  [facility];  Service: Endoscopy;  Laterality: N/A;   IR ANGIOGRAM SELECTIVE EACH ADDITIONAL VESSEL  09/04/2022   IR EMBO VENOUS NOT HEMORR HEMANG  INC GUIDE ROADMAPPING  09/04/2022   IR INTRAVASCULAR ULTRASOUND NON CORONARY  09/04/2022   IR PARACENTESIS  09/04/2022   IR RADIOLOGIST EVAL & MGMT  07/19/2022   IR RADIOLOGIST EVAL & MGMT  08/14/2022   IR RADIOLOGIST EVAL & MGMT  09/29/2022   IR RADIOLOGIST EVAL & MGMT  10/31/2022   IR RADIOLOGIST EVAL & MGMT  11/20/2022   IR RADIOLOGIST EVAL & MGMT  02/21/2023   IR TIPS  09/04/2022   IR US GUIDE VASC ACCESS RIGHT  09/04/2022   POLYPECTOMY  09/18/2019   Procedure: POLYPECTOMY;  Surgeon: West Bali, MD;  [facility];  Service: Endoscopy;;   POLYPECTOMY  12/16/2020   Procedure: POLYPECTOMY;  Surgeon: Corbin Ade, MD;  [facility];  Service: Endoscopy;;  gastric   POLYPECTOMY  03/24/2022   Procedure: POLYPECTOMY;  Surgeon: Lanelle Bal, DO;  [facility];  Service: Endoscopy;;   RADIOLOGY WITH ANESTHESIA N/A 09/04/2022   Procedure: TIPS;  Surgeon: Bennie Dallas, MD;  Location[facility];  Service: Radiology;  Laterality: N/A;    Social History:  reports that he has never smoked. He has been exposed to tobacco smoke. He has never used smokeless tobacco. He reports that he does not currently use alcohol. He reports that he does not use drugs.   Allergies  Allergen Reactions   Chlorthalidone     Severe headaches    Metoprolol     Severe headaches     Family History   Problem Relation Age of Onset   Brain cancer Mother    Diabetes Mother    Diabetes Father    Pulmonary embolism Brother    Liver disease Neg Hx      Prior to Admission medications   Medication Sig Start Date End Date Taking? Authorizing Provider  Cholecalciferol (VITAMIN D3) 125 MCG (5000 UT) TABS Take 5,000 Units by mouth in the morning. 06/17/19   [provider]  ciprofloxacin (CIPRO) 500 MG tablet TAKE 1 TABLET BY MOUTH DAILY 01/21/23   Letta Median, PA-C  cyclobenzaprine (FLEXERIL) 10 MG tablet Take 10 mg by mouth 3 (three) times daily as needed for muscle spasms. 09/29/20   [provider]  dicyclomine (BENTYL) 10 MG capsule TAKE 1 CAPSULE BY MOUTH DAILY BEFORE BREAKFAST 03/15/23   Letta Median, PA-C  DULoxetine (CYMBALTA) 60 MG capsule Take 120 mg by mouth daily. 05/03/22   [provider]  FEROSUL 325 (65 Fe) MG tablet Take 325 mg by mouth 2 (two) times daily. 09/26/22   [provider]  furosemide (LASIX) 40 MG tablet Take  20 mg by mouth 2 (two) times daily. 01/23/22   [provider]  gabapentin (NEURONTIN) 300 MG capsule Take 300 mg by mouth 3 (three) times daily as needed (pain.).    [provider]  lactulose (CHRONULAC) 10 GM/15ML solution Take 60 mLs (40 g total) by mouth 3 (three) times daily. Titrate to goal of 3-4 BMs daily. 09/18/22   Letta Median, PA-C  levOCARNitine (CARNITOR) 330 MG tablet Take 330 mg by mouth 3 (three) times daily. 09/01/20   [provider]  Multiple Vitamin (MULTIVITAMIN) tablet Take 1 tablet by mouth in the morning.    [provider]  ondansetron (ZOFRAN) 4 MG tablet TAKE 1 TABLET BY MOUTH THREE TIMES DAILY BEFORE MEALS 12/19/22   Letta Median, PA-C  pantoprazole (PROTONIX) 40 MG tablet Take 1 tablet (40 mg total) by mouth 2 (two) times daily before a meal. 01/07/23   Letta Median, PA-C  spironolactone (ALDACTONE) 100 MG tablet Take 100 mg by mouth daily.     [provider]  XIFAXAN 550 MG TABS tablet TAKE 1 TABLET BY MOUTH TWICE DAILY 09/11/22   Letta Median, PA-C  Zinc 50 MG TABS Take 50 mg by mouth in the morning.    [provider]    Physical Exam: BP 110/68   Pulse 82   Temp 98.8 F (37.1 C) (Oral)   Resp 20   Ht 5\' 8"  (1.727 m)   Wt 96.6 kg   SpO2 98%   BMI 32.39 kg/m   General: 47 y.o. year-old male well developed well nourished in no acute distress.  Alert and oriented x3. HEENT: NCAT, EOMI Neck: Supple, trachea medial Cardiovascular: Regular rate and rhythm with no rubs or gallops.  No thyromegaly or JVD noted.  Bilateral lower extremity edema. 2/4 pulses in all 4 extremities. Respiratory: Clear to auscultation with no wheezes or rales. Good inspiratory effort. Abdomen: Soft, nontender nondistended with normal bowel sounds x4 quadrants. Muskuloskeletal: Tender to palpation of spine.  Positive straight leg raise test bilaterally.   Neuro: CN II-XII intact, sensation, reflexes intact Skin: No ulcerative lesions noted or rashes Psychiatry: Judgement and insight appear normal. Mood is appropriate for condition and setting          Labs on Admission:  Basic Metabolic Panel: Recent Labs  Lab 04/10/23 1440 04/11/23 0244  NA 124* 128*  K 4.2 4.3  CL 93* 100  CO2 23 23  GLUCOSE 132* 96  BUN 9 9  CREATININE 0.88 0.70  CALCIUM 8.8* 8.3*  MG  --  1.7  PHOS  --  3.0   Liver Function Tests: Recent Labs  Lab 04/10/23 1440 04/11/23 0244  AST 41 30  ALT 38 29  ALKPHOS 233* 198*  BILITOT 2.9* 2.7*  PROT 6.5 5.4*  ALBUMIN 3.1* 2.6*   No results for input(s): "LIPASE", "AMYLASE" in the last 168 hours. No results for input(s): "AMMONIA" in the last 168 hours. CBC: Recent Labs  Lab 04/10/23 1440  WBC 7.1  NEUTROABS 5.5  HGB 12.8*  HCT 38.3*  MCV 99.7  PLT 126*   Cardiac Enzymes: No results for input(s): "CKTOTAL", "CKMB", "CKMBINDEX", "TROPONINI" in the last 168 hours.  BNP (last 3  results) No results for input(s): "BNP" in the last 8760 hours.  ProBNP (last 3 results) No results for input(s): "PROBNP" in the last 8760 hours.  CBG: No results for input(s): "GLUCAP" in the last 168 hours.  Radiological Exams on Admission: MR  LUMBAR SPINE WO CONTRAST  Result Date: 04/10/2023 CLINICAL DATA:  Ataxia, lumbar trauma fall, please include T12 deformity see on CT EXAM: MRI LUMBAR SPINE WITHOUT CONTRAST TECHNIQUE: Multiplanar, multisequence MR imaging of the lumbar spine was performed. No intravenous contrast was administered. COMPARISON:  Same day lumbar spine CT FINDINGS: Segmentation:  Standard. Alignment:  Trace retrolisthesis of L5 on S1. Vertebrae: There are acute to subacute compression deformities at T10, T11, T12, L1 L3 with associated bone marrow edema. There is retropulsion of the L3 vertebral body with bulging of the posterior cortex that results in severe spinal canal stenosis with mass effect on the cauda equina roots. Fracture also involves the bilateral pedicles and the spinous process at L3. Abnormal T2 hyperintense signal is also present in the sacral ala on the left (series 7, image 14), raising the possibility for a left sacral fracture. Conus medullaris and cauda equina: Conus extends to the L2 level. Conus and cauda equina appear normal. 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 Disc levels: T12-L1: Mild bilateral facet degenerative change. Mild spinal canal narrowing. Mild bilateral neural foraminal narrowing. Minimal disc bulge. L1-L2: Moderate bilateral facet degenerative change. Minimal disc bulge. Mild spinal canal narrowing. Mild bilateral neural foraminal narrowing. L2-L3: Moderate bilateral facet degenerative change. There is retropulsion of the posterior cortex of L3. Severe spinal canal narrowing. Moderate left and mild right neural foraminal narrowing. L3-L4: Retropulsion of the posterior cortex of L3. Severe  spinal canal narrowing. Moderate bilateral neural foraminal narrowing. Mild bilateral facet degenerative change. L4-L5: Mild bilateral facet degenerative change. No significant disc bulge. Mild spinal canal narrowing. Moderate bilateral neural foraminal narrowing, left-greater-than-right. L5-S1: Moderate bilateral facet degenerative change. Eccentric left disc bulge. No spinal canal narrowing. Moderate to severe left and moderate right neural foraminal narrowing. IMPRESSION: 1. Acute to subacute compression deformities at T10, T11, T12, L1, and L3. Retropulsion of the posterior cortex of L3 results in severe spinal canal stenosis with mass effect on the cauda equina roots. 2. Abnormal T2 hyperintense signal is also present in the sacral ala on the left, suspicious for a left sacral fracture. Consider further evaluation with a dedicated sacral MRI. 3. Multilevel degenerative changes of the lumbar spine, as described above. Electronically Signed   By: Lorenza Cambridge M.D.   On: 04/10/2023 19:48   CT Lumbar Spine Wo Contrast  Result Date: 04/10/2023 CLINICAL DATA:  Low back pain, fell off porch EXAM: CT LUMBAR SPINE WITHOUT CONTRAST TECHNIQUE: Multidetector CT imaging of the lumbar spine was performed without intravenous contrast administration. Multiplanar CT image reconstructions were also generated. RADIATION DOSE REDUCTION: This exam was performed according to the departmental dose-optimization program which includes automated exposure control, adjustment of the mA and/or kV according to patient size and/or use of iterative reconstruction technique. COMPARISON:  No prior CT of the lumbar spine available, correlation is made with 11/10/2022 CT abdomen pelvis and 01/19/2023 MRI abdomen FINDINGS: Segmentation: 5 lumbar type vertebral bodies. Alignment: Levocurvature of the lumbar spine. 3 mm anterolisthesis of T11 on T12 and 2 mm retrolisthesis of T12 on L1, in part secondary to retropulsion at T12 trace  retrolisthesis of L3 on L4. Vertebrae: Osteopenia. Acute or acute on chronic burst fracture of L3, which is new from both the 11/09/2022 CT and 01/19/2023 MRI. Up to 80% vertebral body height loss centrally compared to 11/09/2022. 7 mm retropulsion of the posterosuperior cortex. The fracture extends into the right pedicle of L3 (series 11, image 23 and series 12,  image 35). Compression deformity of T12 appears more chronic, although this is new from 11/09/2022 and has progressed compared to 01/19/2023, with up to 60% vertebral body height loss anteriorly. 5 mm of retropulsion posterior cortex, both superiorly and inferiorly. Unchanged compression deformity of L1-L2. No suspicious osseous lesions. Advanced endplate degenerative changes at L5-S1. Paraspinal and other soft tissues: Left adrenal mild lipoma, better characterized on the prior MRI. Nonobstructing bilateral renal calculi. Aortic atherosclerosis. Disc levels: T11-T12: Retropulsion of the posterosuperior cortex of T12 contributes to mild spinal canal stenosis. Moderate facet arthropathy. No neural foraminal narrowing. T12-L1: Minimal disc bulge. Mild facet arthropathy. No spinal canal stenosis or neural foraminal narrowing. L1-L2: Mild disc bulge. Mild-to-moderate facet arthropathy. Mild spinal canal stenosis. Mild right neural foraminal narrowing. L2-L3: Retropulsion of posterosuperior cortex of L3 causes severe spinal canal stenosis, with likely compression of the cauda quinine nerve roots (series 10, image 57). Moderate facet arthropathy. Moderate right and mild left neural foraminal narrowing. Neural foraminal narrowing. L3-L4: Minimal disc bulge. Moderate facet arthropathy. No spinal canal stenosis. Moderate bilateral neural foraminal narrowing. L4-L5: Mild disc bulge. Mild facet arthropathy. No spinal canal stenosis. Mild bilateral neural foraminal narrowing. L5-S1: Disc height loss and disc osteophyte complex. Mild-to-moderate facet arthropathy. No  spinal canal stenosis. Mild left and moderate right neural foraminal narrowing. IMPRESSION: 1. Acute or acute on chronic burst fracture of L3, which is new from the most recent prior exams, with up to 80% vertebral body height loss centrally and 7 mm retropulsion of the posterosuperior cortex. This causes severe spinal canal stenosis, with likely compression of the cauda equina nerve roots. An MRI is recommended for further evaluation. 2. Compression deformity of T12 appears more chronic, although this has progressed from prior exams, with up to 60% vertebral body height loss anteriorly and 5 mm of retropulsion posterior cortex, both superiorly and inferiorly. No significant resulting spinal canal stenosis. 3. L2-L3 and L5-S1 moderate right and mild left neural foraminal narrowing. 4. L3-L4 moderate bilateral neural foraminal narrowing. 5. L4-L5 mild bilateral neural foraminal narrowing. These findings were discussed by telephone on 04/10/2023 at 5:48 pm with provider DIXON. Electronically Signed   By: Wiliam Ke M.D.   On: 04/10/2023 17:48   CT Head Wo Contrast  Result Date: 04/10/2023 CLINICAL DATA:  [name] off porch backwards about 9 days ago, pain EXAM: CT HEAD WITHOUT CONTRAST TECHNIQUE: Contiguous axial images were obtained from the base of the skull through the vertex without intravenous contrast. RADIATION DOSE REDUCTION: This exam was performed according to the departmental dose-optimization program which includes automated exposure control, adjustment of the mA and/or kV according to patient size and/or use of iterative reconstruction technique. COMPARISON:  10/11/2020 FINDINGS: Brain: No evidence of acute infarction, hemorrhage, mass, mass effect, or midline shift. No hydrocephalus or extra-axial fluid collection. Vascular: No hyperdense vessel. Skull: Negative for fracture or focal lesion. Sinuses/Orbits: Mucosal thickening in the ethmoid air cells. No acute finding in the orbits. Other: The mastoid  air cells are well aerated. IMPRESSION: No acute intracranial process. Electronically Signed   By: Wiliam Ke M.D.   On: 04/10/2023 17:27    EKG: I independently viewed the EKG done and my findings are as followed: EKG was not done in the ED  Assessment/Plan Present on Admission:  Lower extremity weakness  Hyponatremia  Thrombocytopenia (HCC)  Liver cirrhosis (HCC)  GERD (gastroesophageal reflux disease)  Principal Problem:   Lower extremity weakness Active Problems:   Liver cirrhosis (HCC)   Thrombocytopenia (HCC)  Hyponatremia   GERD (gastroesophageal reflux disease)   Elevated alkaline phosphatase level   Obesity (BMI 30-39.9)  Lower extremity weakness Continue fall precaution Continue Tylenol as needed Neurosurgery was consulted and recommended admitting patient to [facility] with plan to consult on patient in the morning  Hyponatremia Na 124, chronically low with baseline within 130-133 Continue gentle hydration Continue to monitor sodium with serial BMPs Urine osmolality, serum osmolality and urine sodium will be checked  Chronic thrombocytopenia Platelets 126, no sign of any bleeding Continue to monitor platelet levels  Hypoalbuminemia possibly secondary to mild protein calorie malnutrition Albumin 3.1, protein supplement will be provided  Elevated alkaline phosphatase and total bilirubin possibly due to liver cirrhosis Alkaline phosphatase 233, total bilirubin 2.9 Continue to monitor liver enzymes Diuretics will be held at this time due to hyponatremia  GERD Continue Protonix  Obesity (BMI 32.39) Diet and lifestyle education  DVT prophylaxis: Lovenox  Code Status: Full code  Family Communication: None at bedside  Consults: Neurosurgery (by [facility])   Severity of Illness: The appropriate patient status for this patient is INPATIENT. Inpatient status is judged to be reasonable and necessary in order to provide the required intensity of service to  ensure the patient's safety. The patient's presenting symptoms, physical exam findings, and initial radiographic and laboratory data in the context of their chronic comorbidities is felt to place them at high risk for further clinical deterioration. Furthermore, it is not anticipated that the patient will be medically stable for discharge from the hospital within 2 midnights of admission.   * I certify that at the point of admission it is my clinical judgment that the patient will require inpatient hospital care spanning beyond 2 midnights from the point of admission due to high intensity of service, high risk for further deterioration and high frequency of surveillance required.*  Author: Frankey Shown, DO 04/11/2023 3:28 AM  For on call review [email or web address].

## 2023-04-10 NOTE — Progress Notes (Signed)
Spoke with the patient's medical team over at [facility] ED and recommend they transfer to [facility] under the medicine service. Patient is not having any bowel / bladder incontinence. Main complaint is LE weakness and tingling. We will round on him during morning rounds.  Emilee Hero, PA-C 04/10/23 9:20 PM

## 2023-04-11 DIAGNOSIS — R748 Abnormal levels of other serum enzymes: Secondary | ICD-10-CM | POA: Diagnosis not present

## 2023-04-11 DIAGNOSIS — K219 Gastro-esophageal reflux disease without esophagitis: Secondary | ICD-10-CM | POA: Diagnosis not present

## 2023-04-11 DIAGNOSIS — E669 Obesity, unspecified: Secondary | ICD-10-CM | POA: Insufficient documentation

## 2023-04-11 DIAGNOSIS — R29898 Other symptoms and signs involving the musculoskeletal system: Secondary | ICD-10-CM | POA: Diagnosis not present

## 2023-04-11 DIAGNOSIS — K7460 Unspecified cirrhosis of liver: Secondary | ICD-10-CM

## 2023-04-11 DIAGNOSIS — E871 Hypo-osmolality and hyponatremia: Secondary | ICD-10-CM | POA: Diagnosis not present

## 2023-04-11 DIAGNOSIS — M549 Dorsalgia, unspecified: Secondary | ICD-10-CM | POA: Diagnosis present

## 2023-04-11 DIAGNOSIS — M5441 Lumbago with sciatica, right side: Secondary | ICD-10-CM

## 2023-04-11 DIAGNOSIS — M5442 Lumbago with sciatica, left side: Secondary | ICD-10-CM

## 2023-04-11 LAB — CBC
HCT: 33.3 % — ABNORMAL LOW (ref 39.0–52.0)
Hemoglobin: 11.3 g/dL — ABNORMAL LOW (ref 13.0–17.0)
MCH: 33.8 pg (ref 26.0–34.0)
MCHC: 33.9 g/dL (ref 30.0–36.0)
MCV: 99.7 fL (ref 80.0–100.0)
Platelets: 95 10*3/uL — ABNORMAL LOW (ref 150–400)
RBC: 3.34 MIL/uL — ABNORMAL LOW (ref 4.22–5.81)
RDW: 14.7 % (ref 11.5–15.5)
WBC: 4.7 10*3/uL (ref 4.0–10.5)
nRBC: 0 % (ref 0.0–0.2)

## 2023-04-11 LAB — COMPREHENSIVE METABOLIC PANEL
ALT: 29 U/L (ref 0–44)
AST: 30 U/L (ref 15–41)
Albumin: 2.6 g/dL — ABNORMAL LOW (ref 3.5–5.0)
Alkaline Phosphatase: 198 U/L — ABNORMAL HIGH (ref 38–126)
Anion gap: 5 (ref 5–15)
BUN: 9 mg/dL (ref 6–20)
CO2: 23 mmol/L (ref 22–32)
Calcium: 8.3 mg/dL — ABNORMAL LOW (ref 8.9–10.3)
Chloride: 100 mmol/L (ref 98–111)
Creatinine, Ser: 0.7 mg/dL (ref 0.61–1.24)
GFR, Estimated: >60 mL/min (ref >60)
Glucose, Bld: 96 mg/dL (ref 70–99)
Potassium: 4.3 mmol/L (ref 3.5–5.1)
Sodium: 128 mmol/L — ABNORMAL LOW (ref 135–145)
Total Bilirubin: 2.7 mg/dL — ABNORMAL HIGH (ref <1.2)
Total Protein: 5.4 g/dL — ABNORMAL LOW (ref 6.5–8.1)

## 2023-04-11 LAB — SODIUM, URINE, RANDOM: Sodium, Ur: 132 mmol/L

## 2023-04-11 LAB — HIV ANTIBODY (ROUTINE TESTING W REFLEX): HIV Screen 4th Generation wRfx: NONREACTIVE

## 2023-04-11 LAB — OSMOLALITY, URINE: Osmolality, Ur: 409 mosm/kg (ref 300–900)

## 2023-04-11 LAB — OSMOLALITY: Osmolality: 276 mosm/kg (ref 275–295)

## 2023-04-11 LAB — PHOSPHORUS: Phosphorus: 3 mg/dL (ref 2.5–4.6)

## 2023-04-11 LAB — MAGNESIUM: Magnesium: 1.7 mg/dL (ref 1.7–2.4)

## 2023-04-11 MED ORDER — VITAMIN D 25 MCG (1000 UNIT) PO TABS
5000.0000 [IU] | ORAL_TABLET | Freq: Every morning | ORAL | Status: DC
  Administered 2023-04-12 – 2023-04-14 (×3): 5000 [IU] via ORAL
  Filled 2023-04-11 (×5): qty 5

## 2023-04-11 MED ORDER — DULOXETINE HCL 60 MG PO CPEP
120.0000 mg | ORAL_CAPSULE | Freq: Every day | ORAL | Status: DC
  Administered 2023-04-11 – 2023-04-14 (×4): 120 mg via ORAL
  Filled 2023-04-11 (×3): qty 2
  Filled 2023-04-11: qty 4

## 2023-04-11 MED ORDER — SODIUM CHLORIDE 0.9 % IV SOLN: INTRAVENOUS | Status: AC

## 2023-04-11 MED ORDER — ENSURE ENLIVE PO LIQD
237.0000 mL | Freq: Two times a day (BID) | ORAL | Status: DC
  Administered 2023-04-11 – 2023-04-14 (×6): 237 mL via ORAL
  Filled 2023-04-11 (×6): qty 237

## 2023-04-11 MED ORDER — SPIRONOLACTONE 25 MG PO TABS
100.0000 mg | ORAL_TABLET | Freq: Every day | ORAL | Status: DC
  Administered 2023-04-11 – 2023-04-14 (×4): 100 mg via ORAL
  Filled 2023-04-11: qty 4
  Filled 2023-04-11: qty 1
  Filled 2023-04-11 (×2): qty 4

## 2023-04-11 MED ORDER — FUROSEMIDE 20 MG PO TABS
20.0000 mg | ORAL_TABLET | Freq: Two times a day (BID) | ORAL | Status: DC
  Administered 2023-04-11 (×2): 20 mg via ORAL
  Filled 2023-04-11 (×2): qty 1

## 2023-04-11 MED ORDER — ZINC SULFATE 220 (50 ZN) MG PO CAPS
220.0000 mg | ORAL_CAPSULE | Freq: Every morning | ORAL | Status: DC
  Administered 2023-04-12 – 2023-04-14 (×3): 220 mg via ORAL
  Filled 2023-04-11 (×4): qty 1

## 2023-04-11 MED ORDER — LEVOCARNITINE 1 GM/10ML PO SOLN
330.0000 mg | Freq: Three times a day (TID) | ORAL | Status: DC
  Administered 2023-04-11 – 2023-04-14 (×8): 330 mg via ORAL
  Filled 2023-04-11 (×21): qty 3.3

## 2023-04-11 MED ORDER — LACTULOSE 10 GM/15ML PO SOLN
40.0000 g | Freq: Three times a day (TID) | ORAL | Status: DC
  Administered 2023-04-11 – 2023-04-14 (×10): 40 g via ORAL
  Filled 2023-04-11 (×10): qty 60

## 2023-04-11 MED ORDER — RIFAXIMIN 550 MG PO TABS
550.0000 mg | ORAL_TABLET | Freq: Two times a day (BID) | ORAL | Status: DC
  Administered 2023-04-11 – 2023-04-14 (×7): 550 mg via ORAL
  Filled 2023-04-11 (×7): qty 1

## 2023-04-11 MED ORDER — PANTOPRAZOLE SODIUM 40 MG PO TBEC
40.0000 mg | DELAYED_RELEASE_TABLET | Freq: Two times a day (BID) | ORAL | Status: DC
  Administered 2023-04-11 – 2023-04-14 (×7): 40 mg via ORAL
  Filled 2023-04-11 (×7): qty 1

## 2023-04-11 MED ORDER — DICYCLOMINE HCL 10 MG PO CAPS
10.0000 mg | ORAL_CAPSULE | Freq: Every day | ORAL | Status: DC
  Administered 2023-04-12 – 2023-04-14 (×3): 10 mg via ORAL
  Filled 2023-04-11 (×4): qty 1

## 2023-04-11 MED ORDER — GABAPENTIN 300 MG PO CAPS
300.0000 mg | ORAL_CAPSULE | Freq: Every day | ORAL | Status: DC
  Administered 2023-04-11 – 2023-04-13 (×3): 300 mg via ORAL
  Filled 2023-04-11 (×3): qty 1

## 2023-04-11 NOTE — Consult Note (Signed)
Neurosurgery Consultation  Reason for Consult: Low back pain with leg numbness Referring Physician: Thomes Dinning  CC: Back pain  HPI: This is a 47 y.o. man with a history of CKD3 and cirrhosis s/p TIPS, that presents with low back and bilateral lower extremity numbness and paresthesias after a fall about 10 days ago. When sitting still, no radicular or neurologic symptoms, but when he moves he gets radicular pain in the proximal portion of both legs, no distal symptoms, no new numbness, no weakness but ambulation is pain-limited, no change in bowel or bladder function. No recent use of anti-platelet or anti-coagulant medications, but lab work notable for Na of 124 on admission, PLT of 95.    ROS: A 14 point ROS was performed and is negative except as noted in the HPI.   PMHx:  Past Medical History:  Diagnosis Date   Anxiety 08/2022   CKD (chronic kidney disease) stage 2, GFR 60-89 ml/min 05/24/2022   stage 3a   Depression 06/2022   GERD (gastroesophageal reflux disease)    H/O colonoscopy    H/O endoscopy    Headache    Hypertension    Liver cirrhosis (HCC)    Neuropathy    OSA (obstructive sleep apnea)    not currently using CPAP   S/P abdominal paracentesis    FamHx:  Family History  Problem Relation Age of Onset   Brain cancer Mother    Diabetes Mother    Diabetes Father    Pulmonary embolism Brother    Liver disease Neg Hx    SocHx:  reports that he has never smoked. He has been exposed to tobacco smoke. He has never used smokeless tobacco. He reports that he does not currently use alcohol. He reports that he does not use drugs.  Exam: Vital signs in last 24 hours: Temp:  [97.7 F (36.5 C)-98.8 F (37.1 C)] 97.8 F (36.6 C) (11/06 0728) Pulse Rate:  [82-106] 87 (11/06 0830) Resp:  [17-20] 17 (11/06 0728) BP: (108-137)/(66-80) 113/72 (11/06 0830) SpO2:  [93 %-99 %] 98 % (11/06 0830) Weight:  [96.6 kg] 96.6 kg (11/05 1248) General: Awake, alert, cooperative, lying in  bed in NAD Head: Scaphocephalic with a component of torrencephaly, atraumati Neuro: Strength 5/5 x4 but pain limited in the hip flexors due to back pain, SILTx4, no hoffman's, no clonus   Assessment and Plan: 47 y.o. man with severe liver disease, CKD, thrombocytopenia, hyponatremia. CT/MRI of the lumbar spine personally reviewed, which show fractures of T10 / T11 / T12 / L1 / L3. L3 frx with retropulsion that causes severe canal stenosis, also likely left sacral fracture.    -discussed with the patient, with severe liver disease, thrombocytopenia, CKD, he's very high risk for surgery. Additionally, given how poor his bone quality is, unfortunately I don't think there are any surgical options that will provide him significant relief without very high risk. We can try a TLSO brace to see if that provides him some relief.  -please call with any concerns or questions  Jadene Pierini, MD 04/11/23 9:08 AM [facility]

## 2023-04-11 NOTE — Progress Notes (Signed)
   04/11/23 1227  TOC Brief Assessment  Insurance and Status Reviewed (Medicaid [employer])  Patient has primary care physician Yes ([place], O'Laf, PA-C)  Home environment has been reviewed From home  Prior level of function: Independent  Prior/Current Home Services No current home services  Social Determinants of Health Reivew SDOH reviewed no interventions necessary  Readmission risk has been reviewed Yes (18%)  Transition of care needs no transition of care needs at this time   [facility] will continue to follow patient for any discharge needs

## 2023-04-11 NOTE — Plan of Care (Signed)

## 2023-04-11 NOTE — Progress Notes (Signed)
Orthopedic Tech Progress Note Patient Details:  Alejandro Porter 03/19/1976 [id number]  Ortho Devices Type of Ortho Device: Thoracolumbar corset (TLSO) Ortho Device/Splint Interventions: Ordered, Application, Adjustment  Austin and I applied the brace and I fit it. Post Interventions Patient Tolerated: Well Instructions Provided: Care of device, Adjustment of device  [name] 04/11/2023, 5:25 PM

## 2023-04-11 NOTE — Progress Notes (Signed)
Progress Note   Patient: Alejandro Porter VZD:[id number] DOB: September 26, 1975 DOA: 04/10/2023     1 DOS: the patient was seen and examined on 04/11/2023   Brief admission hospital course: As per H&P written by Dr. Thomes Dinning on 04/10/2023. Alejandro Porter is a 47 y.o. male with medical history significant of cirrhosis of liver, iron deficiency anemia, GERD, portal hypertension, prior TIPS procedure, CKD who presents to the emergency department due to 9-day onset of low back pain due to a fall sustained at home.  Patient states that he missed a step off his porch and fell backwards onto his back hitting his head.  Since the fall, he has had pain radiation from his waist to the knees bilaterally.  He complained of bilateral lower extremity numbness and tingling.  Patient denies chest pain, nausea, shortness of breath, vomiting.   ED Course:  In the emergency department, he was hemodynamically stable.  Workup in the ED showed normocytic anemia and thrombocytopenia.  BMP was normal except for sodium of 124, chloride 93, blood glucose 132, albumin 3.1, ALP 233, total bilirubin 2.9 CT head without contrast showed no acute intracranial process CT lumbar spine without contrast showed acute or acute on chronic burst fracture of L3, which is new from the most recent prior exams, with up to 80% vertebral body height loss centrally and 7 mm retropulsion of the posterosuperior cortex. This causes severe spinal canal stenosis, with likely compression of the cauda equina nerve roots. MRI lumbar spine without contrast showed acute to subacute compression deformities at T10, T11, T12, L1, and L3. Retropulsion of the posterior cortex of L3 results in severe spinal canal stenosis with mass effect on the cauda equina roots. Abnormal T2 hyperintense signal is also present in the sacral ala on the left, suspicious for a left sacral fracture.  IV NS 1 L was given.  Neurosurgery (Cosent[place], [place]ge Chad, [place]) was consulted and  recommended transfer to [facility] in the medicine service with plan for neurosurgery team to consult on patient in the morning. Hospitalist was asked to admit patient for further evaluation and management.  Assessment and Plan: 1-lower extremity weakness -Continue as needed analgesics and the use of Neurontin -Case has been discussed with neurosurgery with recommendation to transfer to [facility] for further evaluation and determining management. -Patient reports no bladder or rectal incontinence/retention. -Expressed using steroids prescribed by PCP as an outpatient without significant improvement.  2-hyponatremia chronic in the setting of cirrhosis -Continue to follow electrolytes trend -Patient asymptomatic.  3-chronic thrombocytopenia -Stable and at baseline -No overt bleeding appreciated -Appears to be secondary to chronic cirrhosis status -Continue to follow platelet counts trend.  4-gastroesophageal reflux disease -Continue PPI.  5-history of cirrhosis -Continue lactulose, Lasix, spironolactone and Xifaxan -No significant ascites appreciated on exam -LFTs for the most part at baseline for him (based on chart review). -Continue PPI.  6-class I obesity -Body mass index is 32.39 kg/m. -Low-calorie diet and portion control discussed with patient.   Subjective:  While at rest without moving expressed no significant complaints; denies chest pain, shortness of breath, nausea, vomiting and fever.  Physical Exam: Vitals:   04/11/23 0513 04/11/23 0715 04/11/23 0728 04/11/23 0830  BP: 112/74 108/70  113/72  Pulse: 82   87  Resp: 18  17   Temp: 97.7 F (36.5 C)  97.8 F (36.6 C)   TempSrc: Oral  Oral   SpO2: 98%   98%  Weight:      Height:  General exam: Alert, awake, oriented x 3; no chest pain, no nausea, no vomiting. Respiratory system: Clear to auscultation. Respiratory effort normal.  Good Cardiovascular system:RRR. No rubs or gallops. Gastrointestinal  system: Abdomen is nondistended, soft and nontender.  Positive bowel sounds appreciated. Central nervous system: Bilateral lower extremity weakness (muscle strength 3 out of 5) patient reports pain while trying to engage into activity/movement and intermittent numbness.  No focal deficits.  Upper extremity muscle strength bilateral 4 out of 5 (patient reports unchanged from baseline). Extremities: No cyanosis or clubbing; 1+ edema appreciated bilaterally (patient reported to be unchanged from baseline). Skin: No petechiae. Psychiatry: Judgement and insight appear normal. Mood & affect appropriate.   Data Reviewed: Comprehensive metabolic panel: Sodium 128, potassium 4.3, chloride 100, bicarb 23, BUN 9, creatinine 0.70, alkaline phosphatase 198, total bilirubin 2.7, AST 30, ALT 29 and GFR >60 CBC: WBCs 4.7, hemoglobin 11.3 and platelet count 95K.  Family Communication: No family at bedside.  Disposition: Status is: Inpatient Remains inpatient appropriate because: Continue treatment and evaluation for ongoing lower back pain with bilateral lower extremity weakness.   Planned Discharge Destination: to be determine.  Time spent: 45 minutes  Author: Vassie Loll, MD 04/11/2023 9:43 AM  For on call review [email or web address].

## 2023-04-11 NOTE — ED Notes (Signed)
Attempted report to [facility].

## 2023-04-12 ENCOUNTER — Ambulatory Visit: Payer: Medicaid Other | Attending: Hematology

## 2023-04-12 ENCOUNTER — Ambulatory Visit: Payer: Medicaid Other | Admitting: Gastroenterology

## 2023-04-12 DIAGNOSIS — R29898 Other symptoms and signs involving the musculoskeletal system: Secondary | ICD-10-CM | POA: Diagnosis not present

## 2023-04-12 LAB — COMPREHENSIVE METABOLIC PANEL
ALT: 29 U/L (ref 0–44)
AST: 35 U/L (ref 15–41)
Albumin: 2.6 g/dL — ABNORMAL LOW (ref 3.5–5.0)
Alkaline Phosphatase: 214 U/L — ABNORMAL HIGH (ref 38–126)
Anion gap: 10 (ref 5–15)
BUN: 8 mg/dL (ref 6–20)
CO2: 21 mmol/L — ABNORMAL LOW (ref 22–32)
Calcium: 8.8 mg/dL — ABNORMAL LOW (ref 8.9–10.3)
Chloride: 96 mmol/L — ABNORMAL LOW (ref 98–111)
Creatinine, Ser: 0.82 mg/dL (ref 0.61–1.24)
GFR, Estimated: >60 mL/min (ref >60)
Glucose, Bld: 96 mg/dL (ref 70–99)
Potassium: 4.4 mmol/L (ref 3.5–5.1)
Sodium: 127 mmol/L — ABNORMAL LOW (ref 135–145)
Total Bilirubin: 3.1 mg/dL — ABNORMAL HIGH (ref <1.2)
Total Protein: 6 g/dL — ABNORMAL LOW (ref 6.5–8.1)

## 2023-04-12 LAB — TSH: TSH: 0.147 u[IU]/mL — ABNORMAL LOW (ref 0.350–4.500)

## 2023-04-12 LAB — CBC
HCT: 34.8 % — ABNORMAL LOW (ref 39.0–52.0)
Hemoglobin: 12.2 g/dL — ABNORMAL LOW (ref 13.0–17.0)
MCH: 34.1 pg — ABNORMAL HIGH (ref 26.0–34.0)
MCHC: 35.1 g/dL (ref 30.0–36.0)
MCV: 97.2 fL (ref 80.0–100.0)
Platelets: 89 10*3/uL — ABNORMAL LOW (ref 150–400)
RBC: 3.58 MIL/uL — ABNORMAL LOW (ref 4.22–5.81)
RDW: 14.5 % (ref 11.5–15.5)
WBC: 6.4 10*3/uL (ref 4.0–10.5)
nRBC: 0 % (ref 0.0–0.2)

## 2023-04-12 LAB — OSMOLALITY: Osmolality: 275 mosm/kg (ref 275–295)

## 2023-04-12 LAB — URIC ACID: Uric Acid, Serum: 6.6 mg/dL (ref 3.7–8.6)

## 2023-04-12 MED ORDER — OXYCODONE HCL 5 MG PO TABS
5.0000 mg | ORAL_TABLET | Freq: Three times a day (TID) | ORAL | Status: DC | PRN
  Administered 2023-04-12 – 2023-04-14 (×5): 5 mg via ORAL
  Filled 2023-04-12 (×5): qty 1

## 2023-04-12 MED ORDER — FUROSEMIDE 40 MG PO TABS
40.0000 mg | ORAL_TABLET | Freq: Two times a day (BID) | ORAL | Status: DC
  Administered 2023-04-12 – 2023-04-14 (×5): 40 mg via ORAL
  Filled 2023-04-12 (×5): qty 1

## 2023-04-12 NOTE — Progress Notes (Signed)
Patient calm & cooperative with care. Oxy added for pain, helpful with movement / activity. VSS. Multiple Bms today. Tolerating diet. Braced worn when OOB. Patient contemplating rehab vs going home, this RN educated pt on importance of going to rehab / SNF.

## 2023-04-12 NOTE — Plan of Care (Signed)

## 2023-04-12 NOTE — Plan of Care (Signed)
  Problem: Acute Rehab PT Goals(only PT should resolve) Goal: Pt Will Go Supine/Side To Sit Outcome: Progressing Flowsheets (Taken 04/12/2023 1235) Pt will go Supine/Side to Sit:  with contact guard assist  with minimal assist Goal: Patient Will Transfer Sit To/From Stand Outcome: Progressing Flowsheets (Taken 04/12/2023 1235) Patient will transfer sit to/from stand:  with minimal assist  with contact guard assist Goal: Pt Will Transfer Bed To Chair/Chair To Bed Outcome: Progressing Flowsheets (Taken 04/12/2023 1235) Pt will Transfer Bed to Chair/Chair to Bed:  with min assist  with contact guard assist Goal: Pt Will Ambulate Outcome: Progressing Flowsheets (Taken 04/12/2023 1235) Pt will Ambulate:  50 feet  with contact guard assist  with minimal assist  with rolling walker   12:36 PM, 04/12/23 Ocie Bob, MPT Physical Therapist with [facility] [phone] office [phone] mobile phone

## 2023-04-12 NOTE — Evaluation (Signed)
Physical Therapy Evaluation Patient Details Name: Alejandro Porter MRN: [id number] DOB: March 01, 1976 Today's Date: 04/12/2023  History of Present Illness  Lucious Zou is a 47 y.o. male with medical history significant of cirrhosis of liver, iron deficiency anemia, GERD, portal hypertension, prior TIPS procedure, CKD who presents to the emergency department due to 9-day onset of low back pain due to a fall sustained at home.  Patient states that he missed a step off his porch and fell backwards onto his back hitting his head.  Since the fall, he has had pain radiation from his waist to the knees bilaterally.  He complained of bilateral lower extremity numbness and tingling.  Patient denies chest pain, nausea, shortness of breath, vomiting.   Clinical Impression  Patient required Max assist for donning TLSO brace, demonstrates slow labored movement for rolling to side and sitting up from side lying position requiring Min assist, very unsteady on feet with difficulty advancing RLE due to increasing pain and limited to taking steps in room before having to sit due to fall risk.  Patient tolerated sitting up in chair after therapy.  Patient will benefit from continued skilled physical therapy in hospital and recommended venue below to increase strength, balance, endurance for safe ADLs and gait.        If plan is discharge home, recommend the following: A lot of help with bathing/dressing/bathroom;A lot of help with walking and/or transfers;Help with stairs or ramp for entrance;Assistance with cooking/housework   Can travel by private vehicle   Yes    Equipment Recommendations Rolling walker (2 wheels);BSC/3in1  Recommendations for Other Services       Functional Status Assessment Patient has had a recent decline in their functional status and demonstrates the ability to make significant improvements in function in a reasonable and predictable amount of time.     Precautions / Restrictions  Precautions Precautions: Fall Required Braces or Orthoses: Spinal Brace Spinal Brace: Thoracolumbosacral orthotic Restrictions Weight Bearing Restrictions: No      Mobility  Bed Mobility Overal bed mobility: Needs Assistance Bed Mobility: Rolling, Sidelying to Sit Rolling: Min assist Sidelying to sit: Min assist       General bed mobility comments: increased time, labored movement    Transfers Overall transfer level: Needs assistance Equipment used: Rolling walker (2 wheels) Transfers: Sit to/from Stand, Bed to chair/wheelchair/BSC Sit to Stand: Min assist, Mod assist   Step pivot transfers: Min assist, Mod assist       General transfer comment: unsteady labored movment with c/o increasting low back pain with radiation down RLE to toes    Ambulation/Gait Ambulation/Gait assistance: Mod assist Gait Distance (Feet): 18 Feet Assistive device: Rolling walker (2 wheels) Gait Pattern/deviations: Decreased step length - right, Decreased step length - left, Decreased stride length, Trunk flexed, Antalgic Gait velocity: slow     General Gait Details: slow labored unsteady cadence with frequent shuffling of feet with limited weightbearing on RLE due to increasing pain  Stairs            Wheelchair Mobility     Tilt Bed    Modified Rankin (Stroke Patients Only)       Balance Overall balance assessment: Needs assistance Sitting-balance support: Feet supported, No upper extremity supported Sitting balance-Leahy Scale: Fair Sitting balance - Comments: fair/good seated at EOB   Standing balance support: Reliant on assistive device for balance, During functional activity, Bilateral upper extremity supported Standing balance-Leahy Scale: Poor Standing balance comment: fair/poor using RW  Pertinent Vitals/Pain Pain Assessment Pain Assessment: Faces Faces Pain Scale: Hurts even more Pain Location: right side of low back  with radiation down to toes Pain Descriptors / Indicators: Sharp, Radiating, Grimacing Pain Intervention(s): Limited activity within patient's tolerance, Monitored during session, Repositioned, Patient requesting pain meds-RN notified    Home Living Family/patient expects to be discharged to:: Private residence Living Arrangements: Alone Available Help at Discharge: Family;Friend(s);Available PRN/intermittently Type of Home: House Home Access: Stairs to enter Entrance Stairs-Rails: Right;Left;Can reach both Entrance Stairs-Number of Steps: 3   Home Layout: One level Home Equipment: Grab bars - tub/shower      Prior Function Prior Level of Function : Independent/Modified Independent             Mobility Comments: Community ambulator without AD, does not drive ADLs Comments: Independent for household, assisted for transportion by family     Extremity/Trunk Assessment   Upper Extremity Assessment Upper Extremity Assessment: Defer to OT evaluation    Lower Extremity Assessment Lower Extremity Assessment: Generalized weakness;RLE deficits/detail RLE Deficits / Details: grossly -4/5 RLE: Unable to fully assess due to pain RLE Sensation: WNL RLE Coordination: WNL    Cervical / Trunk Assessment Cervical / Trunk Assessment: Normal  Communication   Communication Communication: No apparent difficulties  Cognition Arousal: Alert Behavior During Therapy: WFL for tasks assessed/performed Overall Cognitive Status: Within Functional Limits for tasks assessed                                          General Comments      Exercises     Assessment/Plan    PT Assessment Patient needs continued PT services  PT Problem List Decreased strength;Decreased activity tolerance;Decreased balance;Decreased mobility       PT Treatment Interventions DME instruction;Gait training;Stair training;Functional mobility training;Therapeutic activities;Therapeutic  exercise;Balance training;Patient/family education    PT Goals (Current goals can be found in the Care Plan section)  Acute Rehab PT Goals Patient Stated Goal: return home with family, friends to assist PT Goal Formulation: With patient Time For Goal Achievement: 04/26/23 Potential to Achieve Goals: Good    Frequency Min 3X/week     Co-evaluation               AM-PAC PT "6 Clicks" Mobility  Outcome Measure Help needed turning from your back to your side while in a flat bed without using bedrails?: A Little Help needed moving from lying on your back to sitting on the side of a flat bed without using bedrails?: A Little Help needed moving to and from a bed to a chair (including a wheelchair)?: A Lot Help needed standing up from a chair using your arms (e.g., wheelchair or bedside chair)?: A Little Help needed to walk in hospital room?: A Lot Help needed climbing 3-5 steps with a railing? : A Lot 6 Click Score: 15    End of Session   Activity Tolerance: Patient tolerated treatment well;Patient limited by fatigue;Patient limited by pain Patient left: in chair;with call bell/phone within reach Nurse Communication: Mobility status PT Visit Diagnosis: Unsteadiness on feet (R26.81);Other abnormalities of gait and mobility (R26.89);Muscle weakness (generalized) (M62.81)    Time: 1120-1150 PT Time Calculation (min) (ACUTE ONLY): 30 min   Charges:   PT Evaluation $PT Eval Moderate Complexity: 1 Mod PT Treatments $Therapeutic Activity: 23-37 mins PT General Charges $$ ACUTE PT VISIT: 1 Visit  12:33 PM, 04/12/23 Ocie Bob, MPT Physical Therapist with [facility] [phone] office [phone] mobile phone

## 2023-04-12 NOTE — Progress Notes (Signed)
Patient arrived from [facility]. Patient states he has taken all of this morning medication, This nurse called and spoke to previous nurse Rolland Bimler and she confirmed that patient has not been given any morning medication.

## 2023-04-12 NOTE — Progress Notes (Signed)
PROGRESS NOTE                                                                                                                                                                                                             Patient Demographics:    Alejandro Porter, is a 47 y.o. male, DOB - [birth year], ZOX:[id number]  Outpatient Primary MD for the patient is Reather Converse, PA-C    LOS - 2  Admit date - 04/10/2023    Chief Complaint  Patient presents with   Back Pain       Brief Narrative (HPI from H&P)   47 y.o. male with medical history significant of cirrhosis of liver, iron deficiency anemia, GERD, portal hypertension, prior TIPS procedure, CKD who presents to the emergency department due to 9-day onset of low back pain due to a fall sustained at home.  Patient states that he missed a step off his porch and fell backwards onto his back hitting his head.  Since the fall, he has had pain radiation from his waist to the knees bilaterally.  He complained of bilateral lower extremity numbness and tingling, he presented to [facility] ER where workup suggestive of T10-L3 compression spine fracture with some L3 spine stenosis and disc retropulsion.     He was transferred to [facility] where he was seen by Dr. Johnsie Cancel neurosurgeon who recommended ongoing medical management, TLSO brace when out of bed, not operative candidate.  Patient stable eager to go back to [facility].   Subjective:    Alejandro Porter today has, No headache, No chest pain, No abdominal pain - No Nausea, No new weakness tingling or numbness, no SOB, mild improvement in bilateral lower extremity weakness   Assessment  & Plan :    1-bilateral lower extremity weakness with MRI evidence of T10-L3 compression fractures with L3 disc retropulsion. -Seen by neurosurgery at [facility], not a operative candidate, continue supportive care, pain control,  no bowel or bladder incontinence, TLSO brace when out of bed, PT OT.  Transfer back to any pain.  2-hyponatremia chronic in the setting of cirrhosis -In fluid overload clinically, adjusted diuretic dose, increase Lasix, placed on free water restriction.  Monitor.  3-chronic thrombocytopenia -Chronic due to underlying cirrhosis, discontinue prophylactic Lovenox, SCDs.  Monitor  4-gastroesophageal reflux disease -Continue PPI.  5-history  of cirrhosis -Continue lactulose, Lasix, spironolactone and Xifaxan, Lasix dose increased due to ongoing fluid overload and hyponatremia. -No significant ascites appreciated on exam -LFTs for the most part at baseline for him (based on chart review). -Continue PPI.  6-class I obesity -Body mass index is 32.39 kg/m. -Low-calorie diet and portion control discussed with patient.        Condition - Guarded  Family Communication  :  None  Code Status :  Full  Consults  :  NS Dr Maurice Small  PUD Prophylaxis : PPI   Procedures  :            Disposition Plan  :    Status is: Inpatient   DVT Prophylaxis  :    SCDs Start: 04/10/23 2346    Lab Results  Component Value Date   PLT 89 (L) 04/12/2023    Diet :  Diet Order             Diet Heart Room service appropriate? Yes; Fluid consistency: Thin; Fluid restriction: 1200 mL Fluid  Diet effective now                    Inpatient Medications  Scheduled Meds:  cholecalciferol  5,000 Units Oral q AM   dicyclomine  10 mg Oral Q breakfast   DULoxetine  120 mg Oral Daily   feeding supplement  237 mL Oral BID BM   furosemide  40 mg Oral BID   gabapentin  300 mg Oral QHS   lactulose  40 g Oral TID   levOCARNitine  330 mg Oral TID   pantoprazole  40 mg Oral BID AC   rifaximin  550 mg Oral BID   spironolactone  100 mg Oral Daily   zinc sulfate  220 mg Oral q AM   Continuous Infusions: PRN Meds:.ondansetron **OR** ondansetron (ZOFRAN) IV  Antibiotics  :    Anti-infectives  (From admission, onward)    Start     Dose/Rate Route Frequency Ordered Stop   04/11/23 1000  rifaximin (XIFAXAN) tablet 550 mg        550 mg Oral 2 times daily 04/11/23 0937           Objective:   Vitals:   04/11/23 1752 04/11/23 2000 04/12/23 0000 04/12/23 0400  BP: (!) 116/59 111/77 113/70 116/72  Pulse: 87   86  Resp: 20 18 (!) 23 18  Temp: 98.3 F (36.8 C) 98.6 F (37 C) 97.9 F (36.6 C) 98.1 F (36.7 C)  TempSrc:  Oral Oral Oral  SpO2: 97% 98% 92% 96%  Weight:      Height:        Wt Readings from Last 3 Encounters:  04/10/23 96.6 kg  04/02/23 104.2 kg  02/02/23 105.1 kg     Intake/Output Summary (Last 24 hours) at 04/12/2023 0733 Last data filed at 04/12/2023 0001 Gross per 24 hour  Intake --  Output 2050 ml  Net -2050 ml     Physical Exam  Awake Alert, No new F.N deficits, Normal affect [place].AT,PERRAL Supple Neck, No JVD,   Symmetrical Chest wall movement, Good air movement bilaterally, CTAB RRR,No Gallops,Rubs or new Murmurs,  +ve B.Sounds, Abd Soft, No tenderness,   +ve edema        Data Review:    Recent Labs  Lab 04/10/23 1440 04/11/23 0244 04/12/23 0556  WBC 7.1 4.7 6.4  HGB 12.8* 11.3* 12.2*  HCT 38.3* 33.3* 34.8*  PLT 126* 95* 89*  MCV 99.7 99.7 97.2  MCH 33.3 33.8 34.1*  MCHC 33.4 33.9 35.1  RDW 14.8 14.7 14.5  LYMPHSABS 0.5*  --   --   MONOABS 0.8  --   --   EOSABS 0.2  --   --   BASOSABS 0.0  --   --     Recent Labs  Lab 04/10/23 1440 04/11/23 0244 04/12/23 0556  NA 124* 128* 127*  K 4.2 4.3 4.4  CL 93* 100 96*  CO2 23 23 21*  ANIONGAP 8 5 10   GLUCOSE 132* 96 96  BUN 9 9 8   CREATININE 0.88 0.70 1.61  AST 41 30 35  ALT 38 29 29  ALKPHOS 233* 198* 214*  BILITOT 2.9* 2.7* 3.1*  ALBUMIN 3.1* 2.6* 2.6*  MG  --  1.7  --   CALCIUM 8.8* 8.3* 8.8*      Radiology Reports MR LUMBAR SPINE WO CONTRAST  Result Date: 04/10/2023 CLINICAL DATA:  Ataxia, lumbar trauma fall, please include T12 deformity see on CT EXAM:  MRI LUMBAR SPINE WITHOUT CONTRAST TECHNIQUE: Multiplanar, multisequence MR imaging of the lumbar spine was performed. No intravenous contrast was administered. COMPARISON:  Same day lumbar spine CT FINDINGS: Segmentation:  Standard. Alignment:  Trace retrolisthesis of L5 on S1. Vertebrae: There are acute to subacute compression deformities at T10, T11, T12, L1 L3 with associated bone marrow edema. There is retropulsion of the L3 vertebral body with bulging of the posterior cortex that results in severe spinal canal stenosis with mass effect on the cauda equina roots. Fracture also involves the bilateral pedicles and the spinous process at L3. Abnormal T2 hyperintense signal is also present in the sacral ala on the left (series 7, image 14), raising the possibility for a left sacral fracture. Conus medullaris and cauda equina: Conus extends to the L2 level. Conus and cauda equina appear normal. 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 Disc levels: T12-L1: Mild bilateral facet degenerative change. Mild spinal canal narrowing. Mild bilateral neural foraminal narrowing. Minimal disc bulge. L1-L2: Moderate bilateral facet degenerative change. Minimal disc bulge. Mild spinal canal narrowing. Mild bilateral neural foraminal narrowing. L2-L3: Moderate bilateral facet degenerative change. There is retropulsion of the posterior cortex of L3. Severe spinal canal narrowing. Moderate left and mild right neural foraminal narrowing. L3-L4: Retropulsion of the posterior cortex of L3. Severe spinal canal narrowing. Moderate bilateral neural foraminal narrowing. Mild bilateral facet degenerative change. L4-L5: Mild bilateral facet degenerative change. No significant disc bulge. Mild spinal canal narrowing. Moderate bilateral neural foraminal narrowing, left-greater-than-right. L5-S1: Moderate bilateral facet degenerative change. Eccentric left disc bulge. No spinal canal narrowing.  Moderate to severe left and moderate right neural foraminal narrowing. IMPRESSION: 1. Acute to subacute compression deformities at T10, T11, T12, L1, and L3. Retropulsion of the posterior cortex of L3 results in severe spinal canal stenosis with mass effect on the cauda equina roots. 2. Abnormal T2 hyperintense signal is also present in the sacral ala on the left, suspicious for a left sacral fracture. Consider further evaluation with a dedicated sacral MRI. 3. Multilevel degenerative changes of the lumbar spine, as described above. Electronically Signed   By: Lorenza Cambridge M.D.   On: 04/10/2023 19:48   CT Lumbar Spine Wo Contrast  Result Date: 04/10/2023 CLINICAL DATA:  Low back pain, fell off porch EXAM: CT LUMBAR SPINE WITHOUT CONTRAST TECHNIQUE: Multidetector CT imaging of the lumbar spine was performed without intravenous contrast administration. Multiplanar CT image reconstructions were  also generated. RADIATION DOSE REDUCTION: This exam was performed according to the departmental dose-optimization program which includes automated exposure control, adjustment of the mA and/or kV according to patient size and/or use of iterative reconstruction technique. COMPARISON:  No prior CT of the lumbar spine available, correlation is made with 11/10/2022 CT abdomen pelvis and 01/19/2023 MRI abdomen FINDINGS: Segmentation: 5 lumbar type vertebral bodies. Alignment: Levocurvature of the lumbar spine. 3 mm anterolisthesis of T11 on T12 and 2 mm retrolisthesis of T12 on L1, in part secondary to retropulsion at T12 trace retrolisthesis of L3 on L4. Vertebrae: Osteopenia. Acute or acute on chronic burst fracture of L3, which is new from both the 11/09/2022 CT and 01/19/2023 MRI. Up to 80% vertebral body height loss centrally compared to 11/09/2022. 7 mm retropulsion of the posterosuperior cortex. The fracture extends into the right pedicle of L3 (series 11, image 23 and series 12, image 35). Compression deformity of T12  appears more chronic, although this is new from 11/09/2022 and has progressed compared to 01/19/2023, with up to 60% vertebral body height loss anteriorly. 5 mm of retropulsion posterior cortex, both superiorly and inferiorly. Unchanged compression deformity of L1-L2. No suspicious osseous lesions. Advanced endplate degenerative changes at L5-S1. Paraspinal and other soft tissues: Left adrenal mild lipoma, better characterized on the prior MRI. Nonobstructing bilateral renal calculi. Aortic atherosclerosis. Disc levels: T11-T12: Retropulsion of the posterosuperior cortex of T12 contributes to mild spinal canal stenosis. Moderate facet arthropathy. No neural foraminal narrowing. T12-L1: Minimal disc bulge. Mild facet arthropathy. No spinal canal stenosis or neural foraminal narrowing. L1-L2: Mild disc bulge. Mild-to-moderate facet arthropathy. Mild spinal canal stenosis. Mild right neural foraminal narrowing. L2-L3: Retropulsion of posterosuperior cortex of L3 causes severe spinal canal stenosis, with likely compression of the cauda quinine nerve roots (series 10, image 57). Moderate facet arthropathy. Moderate right and mild left neural foraminal narrowing. Neural foraminal narrowing. L3-L4: Minimal disc bulge. Moderate facet arthropathy. No spinal canal stenosis. Moderate bilateral neural foraminal narrowing. L4-L5: Mild disc bulge. Mild facet arthropathy. No spinal canal stenosis. Mild bilateral neural foraminal narrowing. L5-S1: Disc height loss and disc osteophyte complex. Mild-to-moderate facet arthropathy. No spinal canal stenosis. Mild left and moderate right neural foraminal narrowing. IMPRESSION: 1. Acute or acute on chronic burst fracture of L3, which is new from the most recent prior exams, with up to 80% vertebral body height loss centrally and 7 mm retropulsion of the posterosuperior cortex. This causes severe spinal canal stenosis, with likely compression of the cauda equina nerve roots. An MRI is  recommended for further evaluation. 2. Compression deformity of T12 appears more chronic, although this has progressed from prior exams, with up to 60% vertebral body height loss anteriorly and 5 mm of retropulsion posterior cortex, both superiorly and inferiorly. No significant resulting spinal canal stenosis. 3. L2-L3 and L5-S1 moderate right and mild left neural foraminal narrowing. 4. L3-L4 moderate bilateral neural foraminal narrowing. 5. L4-L5 mild bilateral neural foraminal narrowing. These findings were discussed by telephone on 04/10/2023 at 5:48 pm with provider DIXON. Electronically Signed   By: Wiliam Ke M.D.   On: 04/10/2023 17:48   CT Head Wo Contrast  Result Date: 04/10/2023 CLINICAL DATA:  [name] off porch backwards about 9 days ago, pain EXAM: CT HEAD WITHOUT CONTRAST TECHNIQUE: Contiguous axial images were obtained from the base of the skull through the vertex without intravenous contrast. RADIATION DOSE REDUCTION: This exam was performed according to the departmental dose-optimization program which includes automated exposure control, adjustment of  the mA and/or kV according to patient size and/or use of iterative reconstruction technique. COMPARISON:  10/11/2020 FINDINGS: Brain: No evidence of acute infarction, hemorrhage, mass, mass effect, or midline shift. No hydrocephalus or extra-axial fluid collection. Vascular: No hyperdense vessel. Skull: Negative for fracture or focal lesion. Sinuses/Orbits: Mucosal thickening in the ethmoid air cells. No acute finding in the orbits. Other: The mastoid air cells are well aerated. IMPRESSION: No acute intracranial process. Electronically Signed   By: Wiliam Ke M.D.   On: 04/10/2023 17:27      Signature  -   Susa Raring M.D on 04/12/2023 at 7:33 AM   -  To page go to [email or web address]

## 2023-04-13 DIAGNOSIS — K219 Gastro-esophageal reflux disease without esophagitis: Secondary | ICD-10-CM | POA: Diagnosis not present

## 2023-04-13 DIAGNOSIS — R748 Abnormal levels of other serum enzymes: Secondary | ICD-10-CM | POA: Diagnosis not present

## 2023-04-13 DIAGNOSIS — E871 Hypo-osmolality and hyponatremia: Secondary | ICD-10-CM | POA: Diagnosis not present

## 2023-04-13 DIAGNOSIS — R29898 Other symptoms and signs involving the musculoskeletal system: Secondary | ICD-10-CM | POA: Diagnosis not present

## 2023-04-13 LAB — CBC WITH DIFFERENTIAL/PLATELET
Abs Immature Granulocytes: 0.03 10*3/uL (ref 0.00–0.07)
Basophils Absolute: 0 10*3/uL (ref 0.0–0.1)
Basophils Relative: 0 %
Eosinophils Absolute: 0.2 10*3/uL (ref 0.0–0.5)
Eosinophils Relative: 4 %
HCT: 35.1 % — ABNORMAL LOW (ref 39.0–52.0)
Hemoglobin: 12 g/dL — ABNORMAL LOW (ref 13.0–17.0)
Immature Granulocytes: 1 %
Lymphocytes Relative: 8 %
Lymphs Abs: 0.4 10*3/uL — ABNORMAL LOW (ref 0.7–4.0)
MCH: 33.7 pg (ref 26.0–34.0)
MCHC: 34.2 g/dL (ref 30.0–36.0)
MCV: 98.6 fL (ref 80.0–100.0)
Monocytes Absolute: 0.8 10*3/uL (ref 0.1–1.0)
Monocytes Relative: 15 %
Neutro Abs: 4 10*3/uL (ref 1.7–7.7)
Neutrophils Relative %: 72 %
Platelets: 87 10*3/uL — ABNORMAL LOW (ref 150–400)
RBC: 3.56 MIL/uL — ABNORMAL LOW (ref 4.22–5.81)
RDW: 14.5 % (ref 11.5–15.5)
WBC: 5.5 10*3/uL (ref 4.0–10.5)
nRBC: 0 % (ref 0.0–0.2)

## 2023-04-13 LAB — COMPREHENSIVE METABOLIC PANEL
ALT: 24 U/L (ref 0–44)
AST: 29 U/L (ref 15–41)
Albumin: 2.7 g/dL — ABNORMAL LOW (ref 3.5–5.0)
Alkaline Phosphatase: 214 U/L — ABNORMAL HIGH (ref 38–126)
Anion gap: 5 (ref 5–15)
BUN: 13 mg/dL (ref 6–20)
CO2: 25 mmol/L (ref 22–32)
Calcium: 8.6 mg/dL — ABNORMAL LOW (ref 8.9–10.3)
Chloride: 98 mmol/L (ref 98–111)
Creatinine, Ser: 0.88 mg/dL (ref 0.61–1.24)
GFR, Estimated: >60 mL/min (ref >60)
Glucose, Bld: 103 mg/dL — ABNORMAL HIGH (ref 70–99)
Potassium: 4.1 mmol/L (ref 3.5–5.1)
Sodium: 128 mmol/L — ABNORMAL LOW (ref 135–145)
Total Bilirubin: 2.4 mg/dL — ABNORMAL HIGH (ref <1.2)
Total Protein: 5.8 g/dL — ABNORMAL LOW (ref 6.5–8.1)

## 2023-04-13 LAB — BRAIN NATRIURETIC PEPTIDE: B Natriuretic Peptide: 43 pg/mL (ref 0.0–100.0)

## 2023-04-13 LAB — MAGNESIUM: Magnesium: 1.7 mg/dL (ref 1.7–2.4)

## 2023-04-13 LAB — PHOSPHORUS: Phosphorus: 3.6 mg/dL (ref 2.5–4.6)

## 2023-04-13 NOTE — Evaluation (Signed)
Occupational Therapy Evaluation Patient Details Name: Alejandro Porter MRN: [id number] DOB: 1976/01/23 Today's Date: 04/13/2023   History of Present Illness Alejandro Porter is a 47 y.o. male with medical history significant of cirrhosis of liver, iron deficiency anemia, GERD, portal hypertension, prior TIPS procedure, CKD who presents to the emergency department due to 9-day onset of low back pain due to a fall sustained at home.  Patient states that he missed a step off his porch and fell backwards onto his back hitting his head.  Since the fall, he has had pain radiation from his waist to the knees bilaterally.  He complained of bilateral lower extremity numbness and tingling.  Patient denies chest pain, nausea, shortness of breath, vomiting.   Clinical Impression   Pt up in chair upon arrival. Agreeable to OT evaluation. Pt required max assist to donn TLSO brace. Use of RW and mod assist to complete sit to stand t/f. Pt complaining of lower back pain and radiating into feet/toes. Discussed options after d/c with pt- discussed weakness and requirement for physical assist with ADLS and t/f. Recommended rehab due to weakness- pt verbalized understanding but voiced he would like to go home with [facility]. Pt would benefit from continued OT while in acute setting.        If plan is discharge home, recommend the following: A lot of help with walking and/or transfers;A lot of help with bathing/dressing/bathroom    Functional Status Assessment  Patient has had a recent decline in their functional status and demonstrates the ability to make significant improvements in function in a reasonable and predictable amount of time.  Equipment Recommendations  BSC/3in1       Precautions / Restrictions Precautions Precautions: Fall Required Braces or Orthoses: Spinal Brace Spinal Brace: Thoracolumbosacral orthotic Restrictions Weight Bearing Restrictions: No      Mobility Bed Mobility Overal bed mobility:  Needs Assistance Bed Mobility: Rolling, Sidelying to Sit Rolling: Min assist Sidelying to sit: Min assist       General bed mobility comments: increased time, labored movement    Transfers Overall transfer level: Needs assistance Equipment used: Rolling walker (2 wheels) Transfers: Sit to/from Stand, Bed to chair/wheelchair/BSC Sit to Stand: Min assist, Mod assist     Step pivot transfers: Min assist, Mod assist     General transfer comment: unsteady labored movment with c/o increasting low back pain with radiation down RLE to toes      Balance Overall balance assessment: Needs assistance Sitting-balance support: Feet supported, No upper extremity supported Sitting balance-Leahy Scale: Fair Sitting balance - Comments: fair/good seated at EOB                                   ADL either performed or assessed with clinical judgement   ADL Overall ADL's : Needs assistance/impaired Eating/Feeding: Set up   Grooming: Set up   Upper Body Bathing: Minimal assistance   Lower Body Bathing: Maximal assistance   Upper Body Dressing : Moderate assistance   Lower Body Dressing: Maximal assistance   Toilet Transfer: Moderate assistance   Toileting- Clothing Manipulation and Hygiene: Maximal assistance   Tub/ Shower Transfer: Rolling walker (2 wheels);Tub bench;Moderate assistance   Functional mobility during ADLs: Moderate assistance;Rolling walker (2 wheels) General ADL Comments: Max assist to donn TLSO brace     Vision Baseline Vision/History: 0 No visual deficits Ability to See in Adequate Light: 0 Adequate Patient Visual Report: No  change from baseline Vision Assessment?: No apparent visual deficits            Pertinent Vitals/Pain Pain Assessment Pain Assessment: Faces Faces Pain Scale: Hurts whole lot Facial Expression: Grimacing Body Movements: Protection Muscle Tension: Tense, rigid Compliance with ventilator (intubated pts.):  N/A Vocalization (extubated pts.): Talking in normal tone or no sound CPOT Total: 4 Pain Location: right side of low back with radiation down to toes Pain Descriptors / Indicators: Sharp, Radiating, Grimacing Pain Intervention(s): Limited activity within patient's tolerance, Monitored during session     Extremity/Trunk Assessment Upper Extremity Assessment Upper Extremity Assessment: Generalized weakness   Lower Extremity Assessment Lower Extremity Assessment: Defer to PT evaluation   Cervical / Trunk Assessment Cervical / Trunk Assessment: Normal   Communication Communication Communication: No apparent difficulties   Cognition Arousal: Alert Behavior During Therapy: WFL for tasks assessed/performed Overall Cognitive Status: Within Functional Limits for tasks assessed                                                  Home Living Family/patient expects to be discharged to:: Private residence Living Arrangements: Alone Available Help at Discharge: Family;Friend(s);Available PRN/intermittently Type of Home: House Home Access: Stairs to enter [employer] of Steps: 3 Entrance Stairs-Rails: Right;Left;Can reach both Home Layout: One level     Bathroom Shower/Tub: Producer, television/film/video: Standard Bathroom Accessibility: Yes How Accessible: Accessible via walker Home Equipment: Grab bars - tub/shower          Prior Functioning/Environment Prior Level of Function : Independent/Modified Independent             Mobility Comments: Community ambulator without AD, does not drive ADLs Comments: Independent for household, assisted for transportion by family        OT Problem List: Decreased strength;Decreased activity tolerance      OT Treatment/Interventions: Self-care/ADL training;Therapeutic exercise;DME and/or AE instruction;Therapeutic activities    OT Goals(Current goals can be found in the care plan section) Acute Rehab  OT Goals Patient Stated Goal: to return home OT Goal Formulation: With patient Time For Goal Achievement: 04/27/23 Potential to Achieve Goals: Good ADL Goals Pt Will Perform Lower Body Bathing: with supervision;sitting/lateral leans Pt Will Perform Lower Body Dressing: sit to/from stand;with adaptive equipment;with supervision Pt Will Transfer to Toilet: with min assist;grab bars  OT Frequency: Min 2X/week       AM-PAC OT "6 Clicks" Daily Activity     Outcome Measure Help from another person eating meals?: A Little Help from another person taking care of personal grooming?: A Little Help from another person toileting, which includes using toliet, bedpan, or urinal?: A Lot Help from another person bathing (including washing, rinsing, drying)?: A Lot Help from another person to put on and taking off regular upper body clothing?: A Little Help from another person to put on and taking off regular lower body clothing?: A Lot 6 Click Score: 15   End of Session Equipment Utilized During Treatment: Rolling walker (2 wheels);Back brace  Activity Tolerance: Patient limited by pain Patient left: in chair;with chair alarm set;with call bell/phone within reach  OT Visit Diagnosis: Unsteadiness on feet (R26.81);Muscle weakness (generalized) (M62.81)                Time: 5284-1324 OT Time Calculation (min): 23 min Charges:  OT General Charges $OT  Visit: 1 Visit OT Evaluation $OT Eval Low Complexity: 1 Low   Bevelyn Ngo, OTR/L 04/13/2023, 3:45 PM

## 2023-04-13 NOTE — Plan of Care (Signed)
  Problem: Acute Rehab OT Goals (only OT should resolve) Goal: Pt. Will Perform Lower Body Bathing Flowsheets (Taken 04/13/2023 1543) Pt Will Perform Lower Body Bathing:  with supervision  sitting/lateral leans Goal: Pt. Will Perform Lower Body Dressing Flowsheets (Taken 04/13/2023 1543) Pt Will Perform Lower Body Dressing:  sit to/from stand  with adaptive equipment  with supervision Goal: Pt. Will Transfer To Toilet Flowsheets (Taken 04/13/2023 1543) Pt Will Transfer to Toilet:  with min assist  grab bars  Lurena Joiner, OTR/L

## 2023-04-13 NOTE — TOC Progression Note (Signed)
Transition of Care [facility]) - Progression Note    Patient Details  Name: Alejandro Porter MRN: [id number] Date of Birth: [birth year]  [facility]) CM/SW Contact  Geni Bers, RN Phone Number: 04/13/2023, 12:00 PM  Clinical Narrative:     Spoke with pt concerning rehab. Pt states, no to rehab, that he will go home with [facility] and his support.        Expected Discharge Plan and Services                                               Social Determinants of Health (SDOH) Interventions SDOH Screenings   Food Insecurity: No Food Insecurity (04/11/2023)  Housing: Patient Declined (04/11/2023)  Transportation Needs: No Transportation Needs (04/11/2023)  Utilities: Not At Risk (04/11/2023)  Depression (PHQ2-9): High Risk (06/28/2022)  Tobacco Use: Medium Risk (04/10/2023)    Readmission Risk Interventions     No data to display

## 2023-04-13 NOTE — Care Management (Cosign Needed)
30 Day Passar Note  RE:  [id number]    Date of Birth: _[id number]_   Date: 04/13/2023               To Whom It May Concern:  Please be advised that the above-named patient will require a short-term nursing home stay - anticipated 30 days or less for rehabilitation and strengthening.  The plan is for return home.   Geni Bers, RN, CM [phone]

## 2023-04-13 NOTE — Progress Notes (Signed)
Progress Note   Patient: Alejandro Porter WUJ:[id number] DOB: 12-30-1975 DOA: 04/10/2023     3 DOS: the patient was seen and examined on 04/13/2023   Brief admission hospital course: As per H&P written by Dr. Thomes Dinning on 04/10/2023. Alejandro Porter is a 47 y.o. male with medical history significant of cirrhosis of liver, iron deficiency anemia, GERD, portal hypertension, prior TIPS procedure, CKD who presents to the emergency department due to 9-day onset of low back pain due to a fall sustained at home.  Patient states that he missed a step off his porch and fell backwards onto his back hitting his head.  Since the fall, he has had pain radiation from his waist to the knees bilaterally.  He complained of bilateral lower extremity numbness and tingling.  Patient denies chest pain, nausea, shortness of breath, vomiting.   ED Course:  In the emergency department, he was hemodynamically stable.  Workup in the ED showed normocytic anemia and thrombocytopenia.  BMP was normal except for sodium of 124, chloride 93, blood glucose 132, albumin 3.1, ALP 233, total bilirubin 2.9 CT head without contrast showed no acute intracranial process CT lumbar spine without contrast showed acute or acute on chronic burst fracture of L3, which is new from the most recent prior exams, with up to 80% vertebral body height loss centrally and 7 mm retropulsion of the posterosuperior cortex. This causes severe spinal canal stenosis, with likely compression of the cauda equina nerve roots. MRI lumbar spine without contrast showed acute to subacute compression deformities at T10, T11, T12, L1, and L3. Retropulsion of the posterior cortex of L3 results in severe spinal canal stenosis with mass effect on the cauda equina roots. Abnormal T2 hyperintense signal is also present in the sacral ala on the left, suspicious for a left sacral fracture.  IV NS 1 L was given.  Neurosurgery (Cos[place], [place] Chad, [place]) was consulted and  recommended transfer to [facility] in the medicine service with plan for neurosurgery team to consult on patient in the morning. Hospitalist was asked to admit patient for further evaluation and management.  Assessment and Plan: 1-lower extremity weakness -Continue as needed analgesics and the use of Neurontin -Case has been discussed with neurosurgery and given patient high risk no surgical intervention recommended. -Will continue the use of TLSO brace when out of bed, continue analgesic therapy and physical rehabilitation.   -After evaluation by PT recommendation for skilled nursing facility provided; patient declined and preferred home health services instead. -Will orchestrate home health services and provide as needed equipment with anticipated discharge back home on 04/14/2023.    2-hyponatremia chronic in the setting of cirrhosis -Continue to follow electrolytes trend -Patient asymptomatic. -Sodium level 128 -Continue chronic use of diuretic regimen. -Continue to follow ultralights trend -Patient advised to maintain adequate oral hydration.  3-chronic thrombocytopenia -Stable and at baseline -No overt bleeding appreciated -Appears to be secondary to chronic cirrhosis status -Continue to follow platelet counts trend intermittently.  4-gastroesophageal reflux disease -Continue PPI. -Lifestyle changes discussed with patient.  5-history of cirrhosis -Continue lactulose, Lasix, spironolactone and Xifaxan -No significant ascites appreciated on exam -LFTs for the most part at baseline for him (based on chart review). -Continue PPI. -Status post TIPS -Continue patient follow-up with gastroenterology service.  6-class I obesity -Body mass index is 32.39 kg/m. -Low-calorie diet and portion control discussed with patient.   Subjective:  Patient reports still ongoing lower back pain and weakness/neuropathy in his lower extremity (slightly improved after receiving pain medication  and  using brace).  No chest pain, no nausea, no vomiting, no fever.  Physical Exam: Vitals:   04/12/23 1720 04/12/23 2018 04/13/23 0718 04/13/23 1329  BP: 125/81 118/71 129/83 118/74  Pulse: 96 85 83 90  Resp: 16 16 18 15   Temp: 98.5 F (36.9 C) 97.9 F (36.6 C) 98.3 F (36.8 C) 98.5 F (36.9 C)  TempSrc:   Oral Oral  SpO2: 99% 96% 100% 92%  Weight:      Height:       General exam: Alert, awake, oriented x 3; reporting ongoing pain in his lower back radiating down to his legs but is slightly improved.  No nausea or vomiting.  Reports no chest pain or shortness of breath. Respiratory system: Good movement bilaterally; no using accessory muscle.  Good saturation on room air. Cardiovascular system: No rubs or gallops; no JVD. Gastrointestinal system: Abdomen is nondistended, soft and without ascites.  Positive bowel sounds.  No tenderness. Central nervous system: Lower extremity weakness appreciated.  No focal neurological deficits. Extremities: No cyanosis or clubbing.  Patient reports trace to 1+ edema appreciated bilaterally. Skin: No petechiae. Psychiatry: Judgement and insight appear normal. Mood & affect appropriate.   Data Reviewed: CBC: WBC 5.5, hemoglobin 12.0 platelet count 87K. BNP: 43.0 Comprehensive metabolic panel: Sodium 128, potassium 4.1, chloride 98, bicarb 25, BUN 13, creatinine 0.88, alkaline phosphatase 214, total bilirubin 2.4.  GFR>60   Family Communication: No family at bedside.  Disposition: Status is: Inpatient Remains inpatient appropriate because: Continue treatment and evaluation for ongoing lower back pain with bilateral lower extremity weakness.   Planned Discharge Destination: to be determine.  Time spent: 45 minutes  Author: Vassie Loll, MD 04/13/2023 5:43 PM  For on call review [email or web address].

## 2023-04-13 NOTE — NC FL2 (Signed)
[place] MEDICAID FL2 LEVEL OF CARE FORM     IDENTIFICATION  Patient Name: Alejandro Porter Birthdate: 10-28-1975 Sex: male Admission Date (Current Location): 04/10/2023  [facility] [place] and [place] Number:  Producer, television/film/video and Address:         Provider Number: [id number]  Attending Physician Name and Address:  Vassie Loll, MD  Relative Name and Phone Number:  Clemetine Marker [phone] relative    Current Level of Care: Hospital Recommended Level of Care: Skilled Nursing Facility Prior Approval Number:    Date Approved/Denied:   PASRR Number:    Discharge Plan: SNF    Current Diagnoses: Patient Active Problem List   Diagnosis Date Noted   Elevated alkaline phosphatase level 04/11/2023   Obesity (BMI 30-39.9) 04/11/2023   Back pain 04/11/2023   Lower extremity weakness 04/10/2023   Black stool 11/23/2022   Lower extremity edema 11/23/2022   Nausea without vomiting 11/23/2022   Chest pain, pleuritic 11/23/2022   Iron deficiency anemia due to chronic blood loss 09/21/2022   Liver lesion 09/06/2022   Shortness of breath 09/06/2022   Fatigue 09/06/2022   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   Rectal bleeding 03/15/2022   Dyspepsia 03/15/2022   Absolute anemia 02/01/2021   Umbilical hernia 09/24/2020   Right inguinal hernia 09/24/2020   OSA on CPAP 09/24/2020   Staph Auricularis SBP (spontaneous bacterial peritonitis) (HCC) 09/14/2020   Hyponatremia 09/14/2020   GERD (gastroesophageal reflux disease) 09/14/2020   Dysphagia 08/05/2020   Abdominal pain 08/05/2020   Encephalopathy, hepatic (HCC) 05/11/2020   Constipation 02/04/2020   Thrombocytopenia (HCC) 10/20/2019   Acute liver failure without hepatic coma    Other ascites    Colorectal polyps    Cecal polyp    Hepatic cirrhosis (HCC) 09/10/2019   Edema 09/10/2019   Portal hypertension (HCC) 09/10/2019   Liver cirrhosis (HCC) 09/10/2019   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    Anasarca     Orientation RESPIRATION BLADDER Height & Weight     Self, Time, Place, Situation  Normal Continent Weight: 96.6 kg Height:  5\' 8"  (172.7 cm)  BEHAVIORAL SYMPTOMS/MOOD NEUROLOGICAL BOWEL NUTRITION STATUS      Continent Diet (Heart Healthy)  AMBULATORY STATUS COMMUNICATION OF NEEDS Skin   Extensive Assist Verbally Normal                       Personal Care Assistance Level of Assistance  Bathing, Feeding, Dressing Bathing Assistance: Limited assistance Feeding assistance: Limited assistance Dressing Assistance: Limited assistance     Functional Limitations Info  Sight, Hearing, Speech Sight Info: Adequate Hearing Info: Adequate Speech Info: Adequate    SPECIAL CARE FACTORS FREQUENCY  PT (By licensed PT), OT (By licensed OT)     PT Frequency: Eval and treat OT Frequency: Eval and treat            Contractures Contractures Info: Not present    Additional Factors Info  Code Status, Allergies Code Status Info: FULL Allergies Info: Chlorthalidone, Metoprolol           Current Medications (04/13/2023):  This is the current hospital active medication list Current Facility-Administered Medications  Medication Dose Route Frequency Provider Last Rate Last Admin   cholecalciferol (VITAMIN D3) 25 MCG (1000 UNIT) tablet 5,000 Units  5,000 Units Oral q AM Vassie Loll, MD   5,000 Units at 04/13/23 0610   dicyclomine (BENTYL) capsule 10 mg  10 mg Oral Q breakfast Vassie Loll, MD  10 mg at 04/13/23 0858   DULoxetine (CYMBALTA) DR capsule 120 mg  120 mg Oral Daily Vassie Loll, MD   120 mg at 04/13/23 0857   feeding supplement (ENSURE ENLIVE / ENSURE PLUS) liquid 237 mL  237 mL Oral BID BM Adefeso, Oladapo, DO   237 mL at 04/12/23 1328   furosemide (LASIX) tablet 40 mg  40 mg Oral BID Leroy Sea, MD   40 mg at 04/13/23 0858   gabapentin (NEURONTIN) capsule 300 mg  300 mg Oral QHS Vassie Loll, MD   300 mg at 04/12/23 2225   lactulose  (CHRONULAC) 10 GM/15ML solution 40 g  40 g Oral TID Vassie Loll, MD   40 g at 04/13/23 0856   levOCARNitine (CARNITOR) 1 GM/10ML solution 330 mg  330 mg Oral TID Vassie Loll, MD   330 mg at 04/13/23 0858   ondansetron (ZOFRAN) tablet 4 mg  4 mg Oral Q6H PRN Frankey Shown, DO       Or   ondansetron (ZOFRAN) injection 4 mg  4 mg Intravenous Q6H PRN Adefeso, Oladapo, DO   4 mg at 04/12/23 2224   oxyCODONE (Oxy IR/ROXICODONE) immediate release tablet 5 mg  5 mg Oral Q8H PRN Vassie Loll, MD   5 mg at 04/12/23 2224   pantoprazole (PROTONIX) EC tablet 40 mg  40 mg Oral BID AC Adefeso, Oladapo, DO   40 mg at 04/13/23 0857   rifaximin (XIFAXAN) tablet 550 mg  550 mg Oral BID Vassie Loll, MD   550 mg at 04/13/23 0857   spironolactone (ALDACTONE) tablet 100 mg  100 mg Oral Daily Vassie Loll, MD   100 mg at 04/13/23 0857   zinc sulfate capsule 220 mg  220 mg Oral q AM Vassie Loll, MD   220 mg at 04/13/23 9629     Discharge Medications: Please see discharge summary for a list of discharge medications.  Relevant Imaging Results:  Relevant Lab Results:   Additional Information SS#[id number]  Geni Bers, RN

## 2023-04-13 NOTE — TOC Progression Note (Signed)
Transition of Care [facility]) - Progression Note    Patient Details  Name: Alejandro Porter MRN: [id number] Date of Birth: [birth year]  Transition of Care [facility]) CM/SW Contact  Geni Bers, RN Phone Number: 04/13/2023, 12:21 PM  Clinical Narrative:     Pt agreed with OP PT. Referral placed for OP PT.        Expected Discharge Plan and Services                                               Social Determinants of Health (SDOH) Interventions SDOH Screenings   Food Insecurity: No Food Insecurity (04/11/2023)  Housing: Patient Declined (04/11/2023)  Transportation Needs: No Transportation Needs (04/11/2023)  Utilities: Not At Risk (04/11/2023)  Depression (PHQ2-9): High Risk (06/28/2022)  Tobacco Use: Medium Risk (04/10/2023)    Readmission Risk Interventions     No data to display

## 2023-04-14 DIAGNOSIS — R29898 Other symptoms and signs involving the musculoskeletal system: Secondary | ICD-10-CM | POA: Diagnosis not present

## 2023-04-14 DIAGNOSIS — K7460 Unspecified cirrhosis of liver: Secondary | ICD-10-CM | POA: Diagnosis not present

## 2023-04-14 DIAGNOSIS — D696 Thrombocytopenia, unspecified: Secondary | ICD-10-CM | POA: Diagnosis not present

## 2023-04-14 DIAGNOSIS — E871 Hypo-osmolality and hyponatremia: Secondary | ICD-10-CM | POA: Diagnosis not present

## 2023-04-14 LAB — CBC WITH DIFFERENTIAL/PLATELET
Abs Immature Granulocytes: 0.02 10*3/uL (ref 0.00–0.07)
Basophils Absolute: 0 10*3/uL (ref 0.0–0.1)
Basophils Relative: 0 %
Eosinophils Absolute: 0.3 10*3/uL (ref 0.0–0.5)
Eosinophils Relative: 5 %
HCT: 35.9 % — ABNORMAL LOW (ref 39.0–52.0)
Hemoglobin: 12 g/dL — ABNORMAL LOW (ref 13.0–17.0)
Immature Granulocytes: 0 %
Lymphocytes Relative: 8 %
Lymphs Abs: 0.5 10*3/uL — ABNORMAL LOW (ref 0.7–4.0)
MCH: 33 pg (ref 26.0–34.0)
MCHC: 33.4 g/dL (ref 30.0–36.0)
MCV: 98.6 fL (ref 80.0–100.0)
Monocytes Absolute: 0.9 10*3/uL (ref 0.1–1.0)
Monocytes Relative: 15 %
Neutro Abs: 4.4 10*3/uL (ref 1.7–7.7)
Neutrophils Relative %: 72 %
Platelets: 95 10*3/uL — ABNORMAL LOW (ref 150–400)
RBC: 3.64 MIL/uL — ABNORMAL LOW (ref 4.22–5.81)
RDW: 14.3 % (ref 11.5–15.5)
WBC: 6.1 10*3/uL (ref 4.0–10.5)
nRBC: 0 % (ref 0.0–0.2)

## 2023-04-14 LAB — COMPREHENSIVE METABOLIC PANEL
ALT: 26 U/L (ref 0–44)
AST: 33 U/L (ref 15–41)
Albumin: 2.8 g/dL — ABNORMAL LOW (ref 3.5–5.0)
Alkaline Phosphatase: 215 U/L — ABNORMAL HIGH (ref 38–126)
Anion gap: 5 (ref 5–15)
BUN: 15 mg/dL (ref 6–20)
CO2: 27 mmol/L (ref 22–32)
Calcium: 8.6 mg/dL — ABNORMAL LOW (ref 8.9–10.3)
Chloride: 94 mmol/L — ABNORMAL LOW (ref 98–111)
Creatinine, Ser: 0.82 mg/dL (ref 0.61–1.24)
GFR, Estimated: >60 mL/min (ref >60)
Glucose, Bld: 90 mg/dL (ref 70–99)
Potassium: 3.9 mmol/L (ref 3.5–5.1)
Sodium: 126 mmol/L — ABNORMAL LOW (ref 135–145)
Total Bilirubin: 2.1 mg/dL — ABNORMAL HIGH (ref <1.2)
Total Protein: 6 g/dL — ABNORMAL LOW (ref 6.5–8.1)

## 2023-04-14 LAB — BRAIN NATRIURETIC PEPTIDE: B Natriuretic Peptide: 27 pg/mL (ref 0.0–100.0)

## 2023-04-14 LAB — MAGNESIUM: Magnesium: 1.8 mg/dL (ref 1.7–2.4)

## 2023-04-14 LAB — PHOSPHORUS: Phosphorus: 3.1 mg/dL (ref 2.5–4.6)

## 2023-04-14 MED ORDER — OXYCODONE HCL 5 MG PO TABS: 5.0000 mg | ORAL_TABLET | Freq: Three times a day (TID) | ORAL | 0 refills | Status: DC | PRN

## 2023-04-14 NOTE — TOC Transition Note (Signed)
Transition of Care [facility]) - CM/SW Discharge Note   Patient Details  Name: Forest Carnahan MRN: [id number] Date of Birth: [birth year]  Transition of Care (TOC) CM/SW Contact:  Catalina Gravel, LCSW Phone Number: 04/14/2023, 2:48 PM   Clinical Narrative:    Pt agreed to [facility] OT/PT and TOC referred.  Today CSW requested orders during progression, orders entered. No further TC needs.     Barriers to Discharge: No Barriers Identified   Patient Goals and CMS Choice      Discharge Placement                         Discharge Plan and Services Additional resources added to the After Visit Summary for                                       Social Determinants of Health (SDOH) Interventions SDOH Screenings   Food Insecurity: No Food Insecurity (04/11/2023)  Housing: Patient Declined (04/11/2023)  Transportation Needs: No Transportation Needs (04/11/2023)  Utilities: Not At Risk (04/11/2023)  Depression (PHQ2-9): High Risk (06/28/2022)  Tobacco Use: Medium Risk (04/10/2023)     Readmission Risk Interventions     No data to display

## 2023-04-14 NOTE — Discharge Summary (Signed)
Physician Discharge Summary   Patient: Alejandro Porter MRN: [id number] DOB: Dec 30, 1975  Admit date:     04/10/2023  Discharge date: 04/14/23  Discharge Physician: Vassie Loll   PCP: Reather Converse, PA-C   Recommendations at discharge:  Repeat complete metabolic panel to follow electrolytes, renal function and LFTs Repeat CBC to follow hemoglobin trend and stability Continue to follow patient response to analgesic therapy and adjust pain medication as required. Continue to help patient with weight management. Please make sure patient has been compliant with outpatient follow-up with gastroenterology service as recommended.  Discharge Diagnoses: Principal Problem:   Lower extremity weakness Active Problems:   Liver cirrhosis (HCC)   Thrombocytopenia (HCC)   Hyponatremia   GERD (gastroesophageal reflux disease)   Elevated alkaline phosphatase level   Obesity (BMI 30-39.9)   Back pain  Brief Hospital admission Course: As per H&P written by Dr. Thomes Dinning on 04/10/2023. Alejandro Porter is a 47 y.o. male with medical history significant of cirrhosis of liver, iron deficiency anemia, GERD, portal hypertension, prior TIPS procedure, CKD who presents to the emergency department due to 9-day onset of low back pain due to a fall sustained at home.  Patient states that he missed a step off his porch and fell backwards onto his back hitting his head.  Since the fall, he has had pain radiation from his waist to the knees bilaterally.  He complained of bilateral lower extremity numbness and tingling.  Patient denies chest pain, nausea, shortness of breath, vomiting.   ED Course:  In the emergency department, he was hemodynamically stable.  Workup in the ED showed normocytic anemia and thrombocytopenia.  BMP was normal except for sodium of 124, chloride 93, blood glucose 132, albumin 3.1, ALP 233, total bilirubin 2.9 CT head without contrast showed no acute intracranial process CT lumbar spine  without contrast showed acute or acute on chronic burst fracture of L3, which is new from the most recent prior exams, with up to 80% vertebral body height loss centrally and 7 mm retropulsion of the posterosuperior cortex. This causes severe spinal canal stenosis, with likely compression of the cauda equina nerve roots. MRI lumbar spine without contrast showed acute to subacute compression deformities at T10, T11, T12, L1, and L3. Retropulsion of the posterior cortex of L3 results in severe spinal canal stenosis with mass effect on the cauda equina roots. Abnormal T2 hyperintense signal is also present in the sacral ala on the left, suspicious for a left sacral fracture.  IV NS 1 L was given.  Neurosurgery ([place], [place] Chad, [place]) was consulted and recommended transfer to [facility] in the medicine service with plan for neurosurgery team to consult on patient in the morning. Hospitalist was asked to admit patient for further evaluation and management.  Assessment and Plan: 1-lower extremity weakness -Continue as needed analgesics and the use of Neurontin -Case has been discussed with neurosurgery and given patient high risk no surgical intervention recommended. -Will continue the use of TLSO brace when out of bed, continue analgesic therapy and physical rehabilitation.   -After evaluation by PT recommendation for skilled nursing facility provided; patient declined and preferred home health services instead. -Will orchestrate home health services and provide as needed equipment with anticipated discharge back home on an outpatient follow-up with PCP. -Continue as needed analgesics.   2-hyponatremia chronic in the setting of cirrhosis -Continue to follow electrolytes trend -Patient asymptomatic. -Sodium level 128 -Continue chronic use of diuretic regimen. -Continue to follow ultralights trend -Patient advised to maintain  adequate oral hydration.   3-chronic thrombocytopenia -Stable and at  baseline -No overt bleeding appreciated -Appears to be secondary to chronic cirrhosis status -Continue to follow platelet counts trend intermittently.   4-gastroesophageal reflux disease -Continue PPI. -Lifestyle changes discussed with patient.   5-history of cirrhosis -Continue lactulose, Lasix, spironolactone and Xifaxan -No significant ascites appreciated on exam -LFTs for the most part at baseline for him (based on chart review). -Continue PPI. -Status post TIPS -Continue patient follow-up with gastroenterology service.   6-class I obesity -Body mass index is 32.39 kg/m. -Low-calorie diet and portion control discussed with patient.   Consultants: Neurosurgery Procedures performed: See below for x-ray reports. Disposition: Home with home health services. Diet recommendation: Heart healthy/low-sodium diet.  DISCHARGE MEDICATION: Allergies as of 04/14/2023       Reactions   Chlorthalidone    Severe headaches    Metoprolol    Severe headaches         Medication List     STOP taking these medications    ciprofloxacin 500 MG tablet Commonly known as: CIPRO   predniSONE 10 MG (21) Tbpk tablet Commonly known as: STERAPRED UNI-PAK 21 TAB       TAKE these medications    cyclobenzaprine 10 MG tablet Commonly known as: FLEXERIL Take 10 mg by mouth 3 (three) times daily as needed for muscle spasms.   dicyclomine 10 MG capsule Commonly known as: BENTYL TAKE 1 CAPSULE BY MOUTH DAILY BEFORE BREAKFAST What changed: See the new instructions.   DULoxetine 60 MG capsule Commonly known as: CYMBALTA Take 120 mg by mouth daily.   FeroSul 325 (65 Fe) MG tablet Generic drug: ferrous sulfate Take 325 mg by mouth 2 (two) times daily.   furosemide 40 MG tablet Commonly known as: LASIX Take 20 mg by mouth 2 (two) times daily.   gabapentin 300 MG capsule Commonly known as: NEURONTIN Take 300 mg by mouth 3 (three) times daily as needed (pain.).   hydrOXYzine 25 MG  tablet Commonly known as: ATARAX Take 25-50 mg by mouth at bedtime as needed.   lactulose 10 GM/15ML solution Commonly known as: CHRONULAC Take 60 mLs (40 g total) by mouth 3 (three) times daily. Titrate to goal of 3-4 BMs daily.   levOCARNitine 330 MG tablet Commonly known as: CARNITOR Take 330 mg by mouth 3 (three) times daily.   ondansetron 4 MG tablet Commonly known as: ZOFRAN TAKE 1 TABLET BY MOUTH THREE TIMES DAILY BEFORE MEALS   oxyCODONE 5 MG immediate release tablet Commonly known as: Oxy IR/ROXICODONE Take 1 tablet (5 mg total) by mouth every 8 (eight) hours as needed for severe pain (pain score 7-10).   pantoprazole 40 MG tablet Commonly known as: PROTONIX Take 1 tablet (40 mg total) by mouth 2 (two) times daily before a meal.   Sodium Fluoride 5000 PPM 1.1 % Gel dental gel Generic drug: sodium fluoride Place 1 Application onto teeth See admin instructions. After regular brushing, flossing and rinsing, brush with pea size amount of this paste for 2 minutes. Spit it out throughly. Do not Rinse. Nothing to eat or drink for at least 30 minute after. Do this twice daily.   spironolactone 100 MG tablet Commonly known as: ALDACTONE Take 100 mg by mouth daily.   Vitamin D3 125 MCG (5000 UT) Tabs Take 5,000 Units by mouth in the morning.   Xifaxan 550 MG Tabs tablet Generic drug: rifaximin TAKE 1 TABLET BY MOUTH TWICE DAILY   Zinc 50 MG Tabs  Take 50 mg by mouth in the morning.        Follow-up Information     [place], O'Laf, PA-C. Schedule an appointment as soon as possible for a visit in 10 day(s).   Specialty: Physician Assistant Contact information: [address] [place] [address] [phone]                Discharge Exam: Ceasar Mons Weights   04/10/23 1248  Weight: 96.6 kg   General exam: Alert, awake, oriented x 3; reporting ongoing pain in his lower back radiating down to his legs but is slightly improved.  No nausea or vomiting.   Reports no chest pain or shortness of breath. Respiratory system: Good movement bilaterally; no using accessory muscle.  Good saturation on room air. Cardiovascular system: No rubs or gallops; no JVD. Gastrointestinal system: Abdomen is nondistended, soft and without ascites.  Positive bowel sounds.  No tenderness. Central nervous system: Lower extremity weakness appreciated.  No focal neurological deficits. Extremities: No cyanosis or clubbing.  Patient reports trace to 1+ edema appreciated bilaterally. Skin: No petechiae. Psychiatry: Judgement and insight appear normal. Mood & affect appropriate.   Condition at discharge: stable  The results of significant diagnostics from this hospitalization (including imaging, microbiology, ancillary and laboratory) are listed below for reference.   Imaging Studies: MR LUMBAR SPINE WO CONTRAST  Result Date: 04/10/2023 CLINICAL DATA:  Ataxia, lumbar trauma fall, please include T12 deformity see on CT EXAM: MRI LUMBAR SPINE WITHOUT CONTRAST TECHNIQUE: Multiplanar, multisequence MR imaging of the lumbar spine was performed. No intravenous contrast was administered. COMPARISON:  Same day lumbar spine CT FINDINGS: Segmentation:  Standard. Alignment:  Trace retrolisthesis of L5 on S1. Vertebrae: There are acute to subacute compression deformities at T10, T11, T12, L1 L3 with associated bone marrow edema. There is retropulsion of the L3 vertebral body with bulging of the posterior cortex that results in severe spinal canal stenosis with mass effect on the cauda equina roots. Fracture also involves the bilateral pedicles and the spinous process at L3. Abnormal T2 hyperintense signal is also present in the sacral ala on the left (series 7, image 14), raising the possibility for a left sacral fracture. Conus medullaris and cauda equina: Conus extends to the L2 level. Conus and cauda equina appear normal. 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 Disc levels: T12-L1: Mild bilateral facet degenerative change. Mild spinal canal narrowing. Mild bilateral neural foraminal narrowing. Minimal disc bulge. L1-L2: Moderate bilateral facet degenerative change. Minimal disc bulge. Mild spinal canal narrowing. Mild bilateral neural foraminal narrowing. L2-L3: Moderate bilateral facet degenerative change. There is retropulsion of the posterior cortex of L3. Severe spinal canal narrowing. Moderate left and mild right neural foraminal narrowing. L3-L4: Retropulsion of the posterior cortex of L3. Severe spinal canal narrowing. Moderate bilateral neural foraminal narrowing. Mild bilateral facet degenerative change. L4-L5: Mild bilateral facet degenerative change. No significant disc bulge. Mild spinal canal narrowing. Moderate bilateral neural foraminal narrowing, left-greater-than-right. L5-S1: Moderate bilateral facet degenerative change. Eccentric left disc bulge. No spinal canal narrowing. Moderate to severe left and moderate right neural foraminal narrowing. IMPRESSION: 1. Acute to subacute compression deformities at T10, T11, T12, L1, and L3. Retropulsion of the posterior cortex of L3 results in severe spinal canal stenosis with mass effect on the cauda equina roots. 2. Abnormal T2 hyperintense signal is also present in the sacral ala on the left, suspicious for a left sacral fracture. Consider further evaluation with  a dedicated sacral MRI. 3. Multilevel degenerative changes of the lumbar spine, as described above. Electronically Signed   By: Lorenza Cambridge M.D.   On: 04/10/2023 19:48   CT Lumbar Spine Wo Contrast  Result Date: 04/10/2023 CLINICAL DATA:  Low back pain, fell off porch EXAM: CT LUMBAR SPINE WITHOUT CONTRAST TECHNIQUE: Multidetector CT imaging of the lumbar spine was performed without intravenous contrast administration. Multiplanar CT image reconstructions were also generated. RADIATION DOSE REDUCTION: This exam was performed  according to the departmental dose-optimization program which includes automated exposure control, adjustment of the mA and/or kV according to patient size and/or use of iterative reconstruction technique. COMPARISON:  No prior CT of the lumbar spine available, correlation is made with 11/10/2022 CT abdomen pelvis and 01/19/2023 MRI abdomen FINDINGS: Segmentation: 5 lumbar type vertebral bodies. Alignment: Levocurvature of the lumbar spine. 3 mm anterolisthesis of T11 on T12 and 2 mm retrolisthesis of T12 on L1, in part secondary to retropulsion at T12 trace retrolisthesis of L3 on L4. Vertebrae: Osteopenia. Acute or acute on chronic burst fracture of L3, which is new from both the 11/09/2022 CT and 01/19/2023 MRI. Up to 80% vertebral body height loss centrally compared to 11/09/2022. 7 mm retropulsion of the posterosuperior cortex. The fracture extends into the right pedicle of L3 (series 11, image 23 and series 12, image 35). Compression deformity of T12 appears more chronic, although this is new from 11/09/2022 and has progressed compared to 01/19/2023, with up to 60% vertebral body height loss anteriorly. 5 mm of retropulsion posterior cortex, both superiorly and inferiorly. Unchanged compression deformity of L1-L2. No suspicious osseous lesions. Advanced endplate degenerative changes at L5-S1. Paraspinal and other soft tissues: Left adrenal mild lipoma, better characterized on the prior MRI. Nonobstructing bilateral renal calculi. Aortic atherosclerosis. Disc levels: T11-T12: Retropulsion of the posterosuperior cortex of T12 contributes to mild spinal canal stenosis. Moderate facet arthropathy. No neural foraminal narrowing. T12-L1: Minimal disc bulge. Mild facet arthropathy. No spinal canal stenosis or neural foraminal narrowing. L1-L2: Mild disc bulge. Mild-to-moderate facet arthropathy. Mild spinal canal stenosis. Mild right neural foraminal narrowing. L2-L3: Retropulsion of posterosuperior cortex of L3  causes severe spinal canal stenosis, with likely compression of the cauda quinine nerve roots (series 10, image 57). Moderate facet arthropathy. Moderate right and mild left neural foraminal narrowing. Neural foraminal narrowing. L3-L4: Minimal disc bulge. Moderate facet arthropathy. No spinal canal stenosis. Moderate bilateral neural foraminal narrowing. L4-L5: Mild disc bulge. Mild facet arthropathy. No spinal canal stenosis. Mild bilateral neural foraminal narrowing. L5-S1: Disc height loss and disc osteophyte complex. Mild-to-moderate facet arthropathy. No spinal canal stenosis. Mild left and moderate right neural foraminal narrowing. IMPRESSION: 1. Acute or acute on chronic burst fracture of L3, which is new from the most recent prior exams, with up to 80% vertebral body height loss centrally and 7 mm retropulsion of the posterosuperior cortex. This causes severe spinal canal stenosis, with likely compression of the cauda equina nerve roots. An MRI is recommended for further evaluation. 2. Compression deformity of T12 appears more chronic, although this has progressed from prior exams, with up to 60% vertebral body height loss anteriorly and 5 mm of retropulsion posterior cortex, both superiorly and inferiorly. No significant resulting spinal canal stenosis. 3. L2-L3 and L5-S1 moderate right and mild left neural foraminal narrowing. 4. L3-L4 moderate bilateral neural foraminal narrowing. 5. L4-L5 mild bilateral neural foraminal narrowing. These findings were discussed by telephone on 04/10/2023 at 5:48 pm with provider DIXON. Electronically Signed   By:  Wiliam Ke M.D.   On: 04/10/2023 17:48   CT Head Wo Contrast  Result Date: 04/10/2023 CLINICAL DATA:  [name] off porch backwards about 9 days ago, pain EXAM: CT HEAD WITHOUT CONTRAST TECHNIQUE: Contiguous axial images were obtained from the base of the skull through the vertex without intravenous contrast. RADIATION DOSE REDUCTION: This exam was performed  according to the departmental dose-optimization program which includes automated exposure control, adjustment of the mA and/or kV according to patient size and/or use of iterative reconstruction technique. COMPARISON:  10/11/2020 FINDINGS: Brain: No evidence of acute infarction, hemorrhage, mass, mass effect, or midline shift. No hydrocephalus or extra-axial fluid collection. Vascular: No hyperdense vessel. Skull: Negative for fracture or focal lesion. Sinuses/Orbits: Mucosal thickening in the ethmoid air cells. No acute finding in the orbits. Other: The mastoid air cells are well aerated. IMPRESSION: No acute intracranial process. Electronically Signed   By: Wiliam Ke M.D.   On: 04/10/2023 17:27    Microbiology: Results for orders placed or performed during the hospital encounter of 08/21/22  Gram stain     Status: None   Collection Time: 08/21/22 10:10 AM   Specimen: Ascitic  Result Value Ref Range Status   Specimen Description ASCITIC  Final   Special Requests NONE  Final   Gram Stain   Final    NO ORGANISMS SEEN WBC PRESENT, PREDOMINANTLY MONONUCLEAR Performed at [facility], [address], [place], [place] [address]    Report Status 08/21/2022 FINAL  Final  Culture, body fluid w Gram Stain-bottle     Status: None   Collection Time: 08/21/22 10:10 AM   Specimen: Peritoneal Washings  Result Value Ref Range Status   Specimen Description PERITONEAL ASCITIC  Final   Special Requests BOTTLES DRAWN AEROBIC AND ANAEROBIC 10CC  Final   Culture   Final    NO GROWTH 5 DAYS Performed at [facility], [address], [place], [place] [address]    Report Status 08/26/2022 FINAL  Final    Labs: CBC: Recent Labs  Lab 04/10/23 1440 04/11/23 0244 04/12/23 0556 04/13/23 0510 04/14/23 0357  WBC 7.1 4.7 6.4 5.5 6.1  NEUTROABS 5.5  --   --  4.0 4.4  HGB 12.8* 11.3* 12.2* 12.0* 12.0*  HCT 38.3* 33.3* 34.8* 35.1* 35.9*  MCV 99.7 99.7 97.2 98.6 98.6  PLT 126* 95* 89* 87* 95*   Basic  Metabolic Panel: Recent Labs  Lab 04/10/23 1440 04/11/23 0244 04/12/23 0556 04/13/23 0510 04/14/23 0357  NA 124* 128* 127* 128* 126*  K 4.2 4.3 4.4 4.1 3.9  CL 93* 100 96* 98 94*  CO2 23 23 21* 25 27  GLUCOSE 132* 96 96 103* 90  BUN 9 9 8 13 15   CREATININE 0.88 0.70 0.82 0.88 0.82  CALCIUM 8.8* 8.3* 8.8* 8.6* 8.6*  MG  --  1.7  --  1.7 1.8  PHOS  --  3.0  --  3.6 3.1   Liver Function Tests: Recent Labs  Lab 04/10/23 1440 04/11/23 0244 04/12/23 0556 04/13/23 0510 04/14/23 0357  AST 41 30 35 29 33  ALT 38 29 29 24 26   ALKPHOS 233* 198* 214* 214* 215*  BILITOT 2.9* 2.7* 3.1* 2.4* 2.1*  PROT 6.5 5.4* 6.0* 5.8* 6.0*  ALBUMIN 3.1* 2.6* 2.6* 2.7* 2.8*   CBG: No results for input(s): "GLUCAP" in the last 168 hours.  Discharge time spent: greater than 30 minutes.  Signed: Vassie Loll, MD [facility] 04/14/2023

## 2023-05-07 ENCOUNTER — Ambulatory Visit (HOSPITAL_COMMUNITY): Payer: Medicaid Other

## 2023-05-07 NOTE — Therapy (Incomplete)
OUTPATIENT PHYSICAL THERAPY LOWER EXTREMITY EVALUATION   Patient Name: Alejandro Porter MRN: [id number] DOB:1975/08/17, 47 y.o., male Today's Date: 05/07/2023  END OF SESSION:   Past Medical History:  Diagnosis Date   Anxiety 08/2022   CKD (chronic kidney disease) stage 2, GFR 60-89 ml/min 05/24/2022   stage 3a   Depression 06/2022   GERD (gastroesophageal reflux disease)    H/O colonoscopy    H/O endoscopy    Headache    Hypertension    Liver cirrhosis (HCC)    Neuropathy    OSA (obstructive sleep apnea)    not currently using CPAP   S/P abdominal paracentesis    Past Surgical History:  Procedure Laterality Date   BIOPSY  09/12/2019   Procedure: BIOPSY;  Surgeon: Corbin Ade, MD;  [facility];  Service: Endoscopy;;   COLONOSCOPY N/A 09/18/2019   Dr. Darrick Penna: 12 colon polyps removed, multiple simple adenomas and some inflammatory polyps, diverticulosis, external and internal hemorrhoids.  Next colonoscopy in 3 years.   COLONOSCOPY WITH PROPOFOL N/A 03/24/2022   Procedure: COLONOSCOPY WITH PROPOFOL;  Surgeon: Lanelle Bal, DO;  [facility];  Service: Endoscopy;  Laterality: N/A;  1:30pm, asa 3   ESOPHAGEAL BANDING N/A 09/12/2019   Procedure: ESOPHAGEAL BANDING;  Surgeon: Corbin Ade, MD;  [facility];  Service: Endoscopy;  Laterality: N/A;   ESOPHAGOGASTRODUODENOSCOPY N/A 09/12/2019   Dr. Jena Gauss: Mild erosive reflux esophagitis.  Schatzki ring status post dilation.  Small grade 1/grade 2 esophageal varices, portal hypertensive gastropathy, hyperplastic gastric polyp, gastric erosions with chronic inactive gastritis on biopsies, no H. pylori.   ESOPHAGOGASTRODUODENOSCOPY (EGD) WITH PROPOFOL N/A 12/16/2020   two short columns of no more than Grade 2 esophageal varices, appearing innocent. Grade 1 varices not apparent today. Multiple large posterior body and antral hyperplastic, hemorrhagic appearing polyps, larges approximately  2.5 cm pedunculated. Portal gastropathy. Normal duodenum. Polypectomy with clips placed. EGD in 18 months.   ESOPHAGOGASTRODUODENOSCOPY (EGD) WITH PROPOFOL N/A 03/24/2022   Procedure: ESOPHAGOGASTRODUODENOSCOPY (EGD) WITH PROPOFOL;  Surgeon: Lanelle Bal, DO;  [facility];  Service: Endoscopy;  Laterality: N/A;   IR ANGIOGRAM SELECTIVE EACH ADDITIONAL VESSEL  09/04/2022   IR EMBO VENOUS NOT HEMORR HEMANG  INC GUIDE ROADMAPPING  09/04/2022   IR INTRAVASCULAR ULTRASOUND NON CORONARY  09/04/2022   IR PARACENTESIS  09/04/2022   IR RADIOLOGIST EVAL & MGMT  07/19/2022   IR RADIOLOGIST EVAL & MGMT  08/14/2022   IR RADIOLOGIST EVAL & MGMT  09/29/2022   IR RADIOLOGIST EVAL & MGMT  10/31/2022   IR RADIOLOGIST EVAL & MGMT  11/20/2022   IR RADIOLOGIST EVAL & MGMT  02/21/2023   IR TIPS  09/04/2022   IR US GUIDE VASC ACCESS RIGHT  09/04/2022   POLYPECTOMY  09/18/2019   Procedure: POLYPECTOMY;  Surgeon: West Bali, MD;  [facility];  Service: Endoscopy;;   POLYPECTOMY  12/16/2020   Procedure: POLYPECTOMY;  Surgeon: Corbin Ade, MD;  [facility];  Service: Endoscopy;;  gastric   POLYPECTOMY  03/24/2022   Procedure: POLYPECTOMY;  Surgeon: Lanelle Bal, DO;  [facility];  Service: Endoscopy;;   RADIOLOGY WITH ANESTHESIA N/A 09/04/2022   Procedure: TIPS;  Surgeon: Bennie Dallas, MD;  [facility];  Service: Radiology;  Laterality: N/A;   Patient Active Problem List   Diagnosis Date Noted   Elevated alkaline phosphatase level 04/11/2023   Obesity (BMI 30-39.9) 04/11/2023  Back pain 04/11/2023   Lower extremity weakness 04/10/2023   Black stool 11/23/2022   Lower extremity edema 11/23/2022   Nausea without vomiting 11/23/2022   Chest pain, pleuritic 11/23/2022   Iron deficiency anemia due to chronic blood loss 09/21/2022   Liver lesion 09/06/2022   Shortness of breath 09/06/2022   Fatigue 09/06/2022   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   Rectal bleeding 03/15/2022   Dyspepsia 03/15/2022   Absolute anemia 02/01/2021   Umbilical hernia 09/24/2020   Right inguinal hernia 09/24/2020   OSA on CPAP 09/24/2020   Staph Auricularis SBP (spontaneous bacterial peritonitis) (HCC) 09/14/2020   Hyponatremia 09/14/2020   GERD (gastroesophageal reflux disease) 09/14/2020   Dysphagia 08/05/2020   Abdominal pain 08/05/2020   Encephalopathy, hepatic (HCC) 05/11/2020   Constipation 02/04/2020   Thrombocytopenia (HCC) 10/20/2019   Acute liver failure without hepatic coma    Other ascites    Colorectal polyps    Cecal polyp    Hepatic cirrhosis (HCC) 09/10/2019   Edema 09/10/2019   Portal hypertension (HCC) 09/10/2019   Liver cirrhosis (HCC) 09/10/2019   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   Anasarca     PCP: Pollyann Kennedy, O'Laf PA-C  REFERRING PROVIDER: Vassie Loll, MD  REFERRING DIAG: R29.898 (ICD-10-CM) - Weakness of both lower extremities   THERAPY DIAG:  No diagnosis found.  Rationale for Evaluation and Treatment: Rehabilitation  ONSET DATE: ***  SUBJECTIVE:   SUBJECTIVE STATEMENT: ***  PERTINENT HISTORY: *** PAIN:  Are you having pain? {OPRCPAIN:27236}  PRECAUTIONS: None  RED FLAGS: {PT Red Flags:29287}   WEIGHT BEARING RESTRICTIONS: No  FALLS:  Has patient fallen in last 6 months? {fallsyesno:27318}   OCCUPATION: ***  PLOF: Independent  PATIENT GOALS: ***  NEXT MD VISIT: ***  OBJECTIVE:  Note: Objective measures were completed at Evaluation unless otherwise noted.  DIAGNOSTIC FINDINGS: ***  PATIENT SURVEYS:  ABC scale ***  COGNITION: Overall cognitive status: Within functional limits for tasks assessed     SENSATION: {sensation:27233}   POSTURE: {posture:25561}  PALPATION: ***  LOWER EXTREMITY ROM:  {AROM/PROM:27142} ROM Right eval Left eval  Hip flexion    Hip extension    Hip abduction    Hip adduction    Hip internal rotation     Hip external rotation    Knee flexion    Knee extension    Ankle dorsiflexion    Ankle plantarflexion    Ankle inversion    Ankle eversion     (Blank rows = not tested)  LOWER EXTREMITY MMT:  MMT Right eval Left eval  Hip flexion    Hip extension    Hip abduction    Hip adduction    Hip internal rotation    Hip external rotation    Knee flexion    Knee extension    Ankle dorsiflexion    Ankle plantarflexion    Ankle inversion    Ankle eversion     (Blank rows = not tested)    FUNCTIONAL TESTS:  30 seconds chair stand test 2 minute walk test: ***  GAIT: Distance walked: *** Assistive device utilized: {Assistive devices:23999} Level of assistance: {Levels of assistance:24026} Comments: ***   TODAY'S TREATMENT:  DATE:   05/07/2023 PT Evaluation    PATIENT EDUCATION:  Education details: PT Evaluation, findings, prognosis, frequency, attendance policy, and HEP if given.  Person educated: Patient Education method: Medical illustrator Education comprehension: verbalized understanding  HOME EXERCISE PROGRAM: ***  ASSESSMENT:  CLINICAL IMPRESSION: Patient is a *** y.o. *** who was seen today for physical therapy evaluation and treatment for R29.898 (ICD-10-CM) - Weakness of both lower extremities. Pt demonstrating *** due to ***. Pt will benefit from skilled Physical Therapy services to address deficits/limitations in order to improve functional and QOL.    OBJECTIVE IMPAIRMENTS: {opptimpairments:25111}.   ACTIVITY LIMITATIONS: {activitylimitations:27494}  PARTICIPATION LIMITATIONS: {participationrestrictions:25113}  PERSONAL FACTORS: {Personal factors:25162} are also affecting patient's functional outcome.   REHAB POTENTIAL: {rehabpotential:25112}  CLINICAL DECISION MAKING: {clinical decision making:25114}  EVALUATION  COMPLEXITY: {Evaluation complexity:25115}   GOALS: Goals reviewed with patient? {yes/no:20286}  SHORT TERM GOALS: Target date: ***  Pt will be independent with HEP in order to demonstrate participation in Physical Therapy POC.  Baseline: Goal status: {GOALSTATUS:25110}  2.  Pt will ***/10 pain during mobility in order to demonstrate improved pain with functional activities.  Baseline:  Goal status: {GOALSTATUS:25110}  LONG TERM GOALS: Target date: ***  Pt will improve 5x STS by *** in order to demonstrate improved functional strength to return to desired activities.  Baseline: *** Goal status: {GOALSTATUS:25110}  2.  Pt will improve 2 MWT by *** in order to demonstrate improved functional ambulatory capacity in community setting.  Baseline: *** Goal status: {GOALSTATUS:25110}  3.  Pt will improve FOTO score by *** in order to demonstrate improved pain with functional goals and outcomes. Baseline: *** Goal status: {GOALSTATUS:25110}  4.  Pt will improve *** by *** in order to improve *** during functional activities.. Baseline: *** Goal status: {GOALSTATUS:25110}  5.  Pt will improve *** by *** in order to improve *** during functional activities.. Baseline: *** Goal status: {GOALSTATUS:25110}   PLAN:  PT FREQUENCY: {rehab frequency:25116}  PT DURATION: {rehab duration:25117}  PLANNED INTERVENTIONS: {rehab planned interventions:25118::"97110-Therapeutic exercises","97530- Therapeutic [phone]- Neuromuscular re-education","97535- Self JXBJ","47829- Manual therapy"}  PLAN FOR NEXT SESSION: ***   Managed Medicaid Authorization Request  Visit Dx Codes: R29.898 (ICD-10-CM) - Weakness of both lower extremities   Functional Tool Score: ***  For all possible CPT codes, reference the Planned Interventions line above.     Check all conditions that are expected to impact treatment: {Conditions expected to impact treatment:{Conditions expected to impact  treatment:28273}   If treatment provided at initial evaluation, no treatment charged due to lack of authorization.     Nelida Meuse, PT 05/07/2023, 10:23 AM

## 2023-05-15 ENCOUNTER — Encounter: Payer: Self-pay | Admitting: Gastroenterology

## 2023-05-16 ENCOUNTER — Encounter (HOSPITAL_COMMUNITY): Payer: Self-pay

## 2023-05-16 ENCOUNTER — Ambulatory Visit (HOSPITAL_COMMUNITY): Payer: Medicaid Other | Attending: Internal Medicine

## 2023-05-16 NOTE — Therapy (Signed)
PT called patient about missed appointment; PT left VM to reschedule evaluation     [facility] at [facility] [address] [place], [place], [address] Phone: [phone]   Fax:  [phone]  Patient Details  Name: Alejandro Porter MRN: [id number] Date of Birth: 06-10-1975 Referring Provider:  No ref. provider found  Encounter Date: 05/16/2023   Seymour Bars, PT 05/16/2023, 3:59 PM  [facility] at [facility] [address] [place], [place], [address] Phone: [phone]   Fax:  [phone]

## 2023-05-17 ENCOUNTER — Ambulatory Visit: Payer: Medicaid Other | Attending: Hematology

## 2023-05-24 ENCOUNTER — Telehealth (HOSPITAL_COMMUNITY): Payer: Self-pay

## 2023-05-24 ENCOUNTER — Ambulatory Visit (HOSPITAL_COMMUNITY): Payer: Medicaid Other

## 2023-05-24 NOTE — Therapy (Deleted)
OUTPATIENT PHYSICAL THERAPY LOWER EXTREMITY EVALUATION   Patient Name: Alejandro Porter MRN: [id number] DOB:[birth year], 47 y.o., male Today's Date: 05/24/2023  END OF SESSION:   Past Medical History:  Diagnosis Date   Anxiety 08/2022   CKD (chronic kidney disease) stage 2, GFR 60-89 ml/min 05/24/2022   stage 3a   Depression 06/2022   GERD (gastroesophageal reflux disease)    H/O colonoscopy    H/O endoscopy    Headache    Hypertension    Liver cirrhosis (HCC)    Neuropathy    OSA (obstructive sleep apnea)    not currently using CPAP   S/P abdominal paracentesis    Past Surgical History:  Procedure Laterality Date   BIOPSY  09/12/2019   Procedure: BIOPSY;  Surgeon: Corbin Ade, MD;  [facility];  Service: Endoscopy;;   COLONOSCOPY N/A 09/18/2019   Dr. Darrick Penna: 12 colon polyps removed, multiple simple adenomas and some inflammatory polyps, diverticulosis, external and internal hemorrhoids.  Next colonoscopy in 3 years.   COLONOSCOPY WITH PROPOFOL N/A 03/24/2022   Procedure: COLONOSCOPY WITH PROPOFOL;  Surgeon: Lanelle Bal, DO;  [facility];  Service: Endoscopy;  Laterality: N/A;  1:30pm, asa 3   ESOPHAGEAL BANDING N/A 09/12/2019   Procedure: ESOPHAGEAL BANDING;  Surgeon: Corbin Ade, MD;  [facility];  Service: Endoscopy;  Laterality: N/A;   ESOPHAGOGASTRODUODENOSCOPY N/A 09/12/2019   Dr. Jena Gauss: Mild erosive reflux esophagitis.  Schatzki ring status post dilation.  Small grade 1/grade 2 esophageal varices, portal hypertensive gastropathy, hyperplastic gastric polyp, gastric erosions with chronic inactive gastritis on biopsies, no H. pylori.   ESOPHAGOGASTRODUODENOSCOPY (EGD) WITH PROPOFOL N/A 12/16/2020   two short columns of no more than Grade 2 esophageal varices, appearing innocent. Grade 1 varices not apparent today. Multiple large posterior body and antral hyperplastic, hemorrhagic appearing polyps, larges approximately  2.5 cm pedunculated. Portal gastropathy. Normal duodenum. Polypectomy with clips placed. EGD in 18 months.   ESOPHAGOGASTRODUODENOSCOPY (EGD) WITH PROPOFOL N/A 03/24/2022   Procedure: ESOPHAGOGASTRODUODENOSCOPY (EGD) WITH PROPOFOL;  Surgeon: Lanelle Bal, DO;  [facility];  Service: Endoscopy;  Laterality: N/A;   IR ANGIOGRAM SELECTIVE EACH ADDITIONAL VESSEL  09/04/2022   IR EMBO VENOUS NOT HEMORR HEMANG  INC GUIDE ROADMAPPING  09/04/2022   IR INTRAVASCULAR ULTRASOUND NON CORONARY  09/04/2022   IR PARACENTESIS  09/04/2022   IR RADIOLOGIST EVAL & MGMT  07/19/2022   IR RADIOLOGIST EVAL & MGMT  08/14/2022   IR RADIOLOGIST EVAL & MGMT  09/29/2022   IR RADIOLOGIST EVAL & MGMT  10/31/2022   IR RADIOLOGIST EVAL & MGMT  11/20/2022   IR RADIOLOGIST EVAL & MGMT  02/21/2023   IR TIPS  09/04/2022   IR US GUIDE VASC ACCESS RIGHT  09/04/2022   POLYPECTOMY  09/18/2019   Procedure: POLYPECTOMY;  Surgeon: West Bali, MD;  [facility];  Service: Endoscopy;;   POLYPECTOMY  12/16/2020   Procedure: POLYPECTOMY;  Surgeon: Corbin Ade, MD;  [facility];  Service: Endoscopy;;  gastric   POLYPECTOMY  03/24/2022   Procedure: POLYPECTOMY;  Surgeon: Lanelle Bal, DO;  [facility];  Service: Endoscopy;;   RADIOLOGY WITH ANESTHESIA N/A 09/04/2022   Procedure: TIPS;  Surgeon: Bennie Dallas, MD;  [facility];  Service: Radiology;  Laterality: N/A;   Patient Active Problem List   Diagnosis Date Noted   Elevated alkaline phosphatase level 04/11/2023   Obesity (BMI 30-39.9) 04/11/2023  Back pain 04/11/2023   Lower extremity weakness 04/10/2023   Black stool 11/23/2022   Lower extremity edema 11/23/2022   Nausea without vomiting 11/23/2022   Chest pain, pleuritic 11/23/2022   Iron deficiency anemia due to chronic blood loss 09/21/2022   Liver lesion 09/06/2022   Shortness of breath 09/06/2022   Fatigue 09/06/2022   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   Rectal bleeding 03/15/2022   Dyspepsia 03/15/2022   Absolute anemia 02/01/2021   Umbilical hernia 09/24/2020   Right inguinal hernia 09/24/2020   OSA on CPAP 09/24/2020   Staph Auricularis SBP (spontaneous bacterial peritonitis) (HCC) 09/14/2020   Hyponatremia 09/14/2020   GERD (gastroesophageal reflux disease) 09/14/2020   Dysphagia 08/05/2020   Abdominal pain 08/05/2020   Encephalopathy, hepatic (HCC) 05/11/2020   Constipation 02/04/2020   Thrombocytopenia (HCC) 10/20/2019   Acute liver failure without hepatic coma    Other ascites    Colorectal polyps    Cecal polyp    Hepatic cirrhosis (HCC) 09/10/2019   Edema 09/10/2019   Portal hypertension (HCC) 09/10/2019   Liver cirrhosis (HCC) 09/10/2019   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   Anasarca     PCP: Reather Converse, PA-C  REFERRING PROVIDER: Vassie Loll, MD  REFERRING DIAG: R29.898 (ICD-10-CM) - Weakness of both lower extremities  THERAPY DIAG:  No diagnosis found.  Rationale for Evaluation and Treatment: Rehabilitation  ONSET DATE: ***  SUBJECTIVE:   SUBJECTIVE STATEMENT: ***  PERTINENT HISTORY: *** PAIN:  Are you having pain? {OPRCPAIN:27236}  PRECAUTIONS: {Therapy precautions:24002}  RED FLAGS: {PT Red Flags:29287}   WEIGHT BEARING RESTRICTIONS: {Yes ***/No:24003}  FALLS:  Has patient fallen in last 6 months? {fallsyesno:27318}  LIVING ENVIRONMENT: Lives with: {OPRC lives with:25569::"lives with their family"} Lives in: {Lives in:25570} Stairs: {opstairs:27293} Has following equipment at home: {Assistive devices:23999}  OCCUPATION: ***  PLOF: {PLOF:24004}  PATIENT GOALS: ***  NEXT MD VISIT: ***  OBJECTIVE:  Note: Objective measures were completed at Evaluation unless otherwise noted.  DIAGNOSTIC FINDINGS: ***  PATIENT SURVEYS:  {rehab surveys:24030}  COGNITION: Overall cognitive status:  {cognition:24006}     SENSATION: {sensation:27233}  EDEMA:  {edema:24020}  MUSCLE LENGTH: Hamstrings: Right *** deg; Left *** deg Maisie Fus test: Right *** deg; Left *** deg  POSTURE: {posture:25561}  PALPATION: ***  LOWER EXTREMITY ROM:  {AROM/PROM:27142} ROM Right eval Left eval  Hip flexion    Hip extension    Hip abduction    Hip adduction    Hip internal rotation    Hip external rotation    Knee flexion    Knee extension    Ankle dorsiflexion    Ankle plantarflexion    Ankle inversion    Ankle eversion     (Blank rows = not tested)  LOWER EXTREMITY MMT:  MMT Right eval Left eval  Hip flexion    Hip extension    Hip abduction    Hip adduction    Hip internal rotation    Hip external rotation    Knee flexion    Knee extension    Ankle dorsiflexion    Ankle plantarflexion    Ankle inversion    Ankle eversion     (Blank rows = not tested)  LOWER EXTREMITY SPECIAL TESTS:  {LEspecialtests:26242}  FUNCTIONAL TESTS:  {Functional tests:24029}  GAIT: Distance walked: *** Assistive device utilized: {Assistive devices:23999} Level of assistance: {Levels of assistance:24026} Comments: ***   TODAY'S TREATMENT:  DATE: ***    PATIENT EDUCATION:  Education details: *** Person educated: {Person educated:25204} Education method: {Education Method:25205} Education comprehension: {Education Comprehension:25206}  HOME EXERCISE PROGRAM: ***  ASSESSMENT:  CLINICAL IMPRESSION: Patient is a *** y.o. *** who was seen today for physical therapy evaluation and treatment for ***.   OBJECTIVE IMPAIRMENTS: {opptimpairments:25111}.   ACTIVITY LIMITATIONS: {activitylimitations:27494}  PARTICIPATION LIMITATIONS: {participationrestrictions:25113}  PERSONAL FACTORS: {Personal factors:25162} are also affecting patient's functional outcome.    REHAB POTENTIAL: {rehabpotential:25112}  CLINICAL DECISION MAKING: {clinical decision making:25114}  EVALUATION COMPLEXITY: {Evaluation complexity:25115}   GOALS: Goals reviewed with patient? {yes/no:20286}  SHORT TERM GOALS: Target date: *** *** Baseline: Goal status: INITIAL  2.  *** Baseline:  Goal status: INITIAL  3.  *** Baseline:  Goal status: INITIAL  4.  *** Baseline:  Goal status: INITIAL  5.  *** Baseline:  Goal status: INITIAL  6.  *** Baseline:  Goal status: INITIAL  LONG TERM GOALS: Target date: ***  *** Baseline:  Goal status: INITIAL  2.  *** Baseline:  Goal status: INITIAL  3.  *** Baseline:  Goal status: INITIAL  4.  *** Baseline:  Goal status: INITIAL  5.  *** Baseline:  Goal status: INITIAL  6.  *** Baseline:  Goal status: INITIAL   PLAN:  PT FREQUENCY: {rehab frequency:25116}  PT DURATION: {rehab duration:25117}  PLANNED INTERVENTIONS: {rehab planned interventions:25118::"97110-Therapeutic exercises","97530- Therapeutic [phone]- Neuromuscular re-education","97535- Self JXBJ","47829- Manual therapy"}  PLAN FOR NEXT SESSION: ***   Jaymarion Trombly L. Dimple Bastyr, PT, DPT, OCS Board-Certified Clinical Specialist in Orthopedic PT PT Compact Privilege # ([place]): [phone] T  05/24/2023, 7:11 AM

## 2023-05-24 NOTE — Telephone Encounter (Signed)
Called patient today to follow up with his appointment as he did not show for his initial evaluation that was scheduled today. Patient states that he's not doing well. Patient was advised to follow up with MD and call us back if he needs to reschedule. Front Information systems manager notified.  Tish Frederickson. Gwenna Fuston, PT, DPT, OCS Board-Certified Clinical Specialist in Orthopedic PT PT Compact Privilege # ([place]): [phone] T

## 2023-05-28 ENCOUNTER — Telehealth: Payer: Self-pay | Admitting: *Deleted

## 2023-05-28 NOTE — Telephone Encounter (Signed)
It has been two months since seen by our practice. I would suggest he reach out to his surgeon and just let them know that he is having these symptoms, they may direct him back to his PCP or to the ER. I agree, if having significant pain, he should go to the ER.

## 2023-05-28 NOTE — Telephone Encounter (Signed)
Spoke to pt, informed him of recommendations. He voiced understanding. He states that he will go to the ER in the morning.

## 2023-05-28 NOTE — Telephone Encounter (Signed)
Pt called and states he is having a lot of pain in his left shoulder that goes down his back. He states his stomach is also hurting. He had surgery 05/10/2023 for a hernia at [facility]. Asked if he had a fever or any nausea or vomiting and he stated no. Informed him that if the pain became to severe that he needed to proceed to the ED. Routing to you in absent of [place]

## 2023-06-01 ENCOUNTER — Other Ambulatory Visit: Payer: Self-pay

## 2023-06-01 DIAGNOSIS — D696 Thrombocytopenia, unspecified: Secondary | ICD-10-CM

## 2023-06-01 DIAGNOSIS — D500 Iron deficiency anemia secondary to blood loss (chronic): Secondary | ICD-10-CM

## 2023-06-04 ENCOUNTER — Inpatient Hospital Stay: Payer: Medicaid Other

## 2023-06-11 ENCOUNTER — Inpatient Hospital Stay: Payer: Medicaid Other | Attending: Hematology | Admitting: Physician Assistant

## 2023-06-11 ENCOUNTER — Inpatient Hospital Stay: Payer: Medicaid Other

## 2023-06-19 ENCOUNTER — Other Ambulatory Visit: Payer: Self-pay

## 2023-06-19 ENCOUNTER — Emergency Department (HOSPITAL_COMMUNITY): Payer: Medicaid Other

## 2023-06-19 ENCOUNTER — Encounter (HOSPITAL_COMMUNITY): Payer: Self-pay

## 2023-06-19 ENCOUNTER — Inpatient Hospital Stay (HOSPITAL_COMMUNITY)
Admission: EM | Admit: 2023-06-19 | Discharge: 2023-07-02 | DRG: 602 | Disposition: A | Discharge status: Hospice | Payer: Medicaid Other | Attending: Internal Medicine | Admitting: Internal Medicine

## 2023-06-19 DIAGNOSIS — R531 Weakness: Secondary | ICD-10-CM

## 2023-06-19 DIAGNOSIS — Z6832 Body mass index (BMI) 32.0-32.9, adult: Secondary | ICD-10-CM | POA: Diagnosis not present

## 2023-06-19 DIAGNOSIS — K2100 Gastro-esophageal reflux disease with esophagitis, without bleeding: Secondary | ICD-10-CM | POA: Diagnosis present

## 2023-06-19 DIAGNOSIS — Z808 Family history of malignant neoplasm of other organs or systems: Secondary | ICD-10-CM

## 2023-06-19 DIAGNOSIS — Z604 Social exclusion and rejection: Secondary | ICD-10-CM | POA: Diagnosis present

## 2023-06-19 DIAGNOSIS — G9341 Metabolic encephalopathy: Secondary | ICD-10-CM | POA: Diagnosis present

## 2023-06-19 DIAGNOSIS — Z833 Family history of diabetes mellitus: Secondary | ICD-10-CM

## 2023-06-19 DIAGNOSIS — K766 Portal hypertension: Secondary | ICD-10-CM | POA: Diagnosis present

## 2023-06-19 DIAGNOSIS — E8770 Fluid overload, unspecified: Secondary | ICD-10-CM | POA: Diagnosis not present

## 2023-06-19 DIAGNOSIS — E860 Dehydration: Secondary | ICD-10-CM | POA: Diagnosis present

## 2023-06-19 DIAGNOSIS — E871 Hypo-osmolality and hyponatremia: Secondary | ICD-10-CM | POA: Diagnosis present

## 2023-06-19 DIAGNOSIS — E46 Unspecified protein-calorie malnutrition: Secondary | ICD-10-CM | POA: Insufficient documentation

## 2023-06-19 DIAGNOSIS — D539 Nutritional anemia, unspecified: Secondary | ICD-10-CM | POA: Diagnosis present

## 2023-06-19 DIAGNOSIS — K7581 Nonalcoholic steatohepatitis (NASH): Secondary | ICD-10-CM | POA: Diagnosis present

## 2023-06-19 DIAGNOSIS — R54 Age-related physical debility: Secondary | ICD-10-CM | POA: Diagnosis present

## 2023-06-19 DIAGNOSIS — Z8249 Family history of ischemic heart disease and other diseases of the circulatory system: Secondary | ICD-10-CM

## 2023-06-19 DIAGNOSIS — Z602 Problems related to living alone: Secondary | ICD-10-CM | POA: Diagnosis present

## 2023-06-19 DIAGNOSIS — Z87440 Personal history of urinary (tract) infections: Secondary | ICD-10-CM

## 2023-06-19 DIAGNOSIS — E43 Unspecified severe protein-calorie malnutrition: Secondary | ICD-10-CM | POA: Diagnosis present

## 2023-06-19 DIAGNOSIS — Z86018 Personal history of other benign neoplasm: Secondary | ICD-10-CM

## 2023-06-19 DIAGNOSIS — N179 Acute kidney failure, unspecified: Secondary | ICD-10-CM | POA: Diagnosis present

## 2023-06-19 DIAGNOSIS — D509 Iron deficiency anemia, unspecified: Secondary | ICD-10-CM | POA: Diagnosis present

## 2023-06-19 DIAGNOSIS — R9431 Abnormal electrocardiogram [ECG] [EKG]: Secondary | ICD-10-CM | POA: Diagnosis present

## 2023-06-19 DIAGNOSIS — Z66 Do not resuscitate: Secondary | ICD-10-CM | POA: Diagnosis not present

## 2023-06-19 DIAGNOSIS — K2980 Duodenitis without bleeding: Secondary | ICD-10-CM | POA: Diagnosis present

## 2023-06-19 DIAGNOSIS — Z9181 History of falling: Secondary | ICD-10-CM

## 2023-06-19 DIAGNOSIS — Z888 Allergy status to other drugs, medicaments and biological substances status: Secondary | ICD-10-CM

## 2023-06-19 DIAGNOSIS — F32A Depression, unspecified: Secondary | ICD-10-CM | POA: Diagnosis present

## 2023-06-19 DIAGNOSIS — K7460 Unspecified cirrhosis of liver: Secondary | ICD-10-CM | POA: Diagnosis present

## 2023-06-19 DIAGNOSIS — Z8719 Personal history of other diseases of the digestive system: Secondary | ICD-10-CM | POA: Diagnosis not present

## 2023-06-19 DIAGNOSIS — E7220 Disorder of urea cycle metabolism, unspecified: Secondary | ICD-10-CM | POA: Diagnosis not present

## 2023-06-19 DIAGNOSIS — Z7189 Other specified counseling: Secondary | ICD-10-CM | POA: Diagnosis not present

## 2023-06-19 DIAGNOSIS — E8809 Other disorders of plasma-protein metabolism, not elsewhere classified: Secondary | ICD-10-CM | POA: Diagnosis present

## 2023-06-19 DIAGNOSIS — R627 Adult failure to thrive: Secondary | ICD-10-CM | POA: Diagnosis present

## 2023-06-19 DIAGNOSIS — L03115 Cellulitis of right lower limb: Secondary | ICD-10-CM | POA: Diagnosis present

## 2023-06-19 DIAGNOSIS — D696 Thrombocytopenia, unspecified: Secondary | ICD-10-CM | POA: Diagnosis present

## 2023-06-19 DIAGNOSIS — G4733 Obstructive sleep apnea (adult) (pediatric): Secondary | ICD-10-CM | POA: Diagnosis present

## 2023-06-19 DIAGNOSIS — I129 Hypertensive chronic kidney disease with stage 1 through stage 4 chronic kidney disease, or unspecified chronic kidney disease: Secondary | ICD-10-CM | POA: Diagnosis present

## 2023-06-19 DIAGNOSIS — Z560 Unemployment, unspecified: Secondary | ICD-10-CM

## 2023-06-19 DIAGNOSIS — L7634 Postprocedural seroma of skin and subcutaneous tissue following other procedure: Secondary | ICD-10-CM | POA: Diagnosis present

## 2023-06-19 DIAGNOSIS — E869 Volume depletion, unspecified: Secondary | ICD-10-CM | POA: Diagnosis not present

## 2023-06-19 DIAGNOSIS — J9811 Atelectasis: Secondary | ICD-10-CM | POA: Diagnosis present

## 2023-06-19 DIAGNOSIS — R7401 Elevation of levels of liver transaminase levels: Secondary | ICD-10-CM | POA: Insufficient documentation

## 2023-06-19 DIAGNOSIS — R188 Other ascites: Secondary | ICD-10-CM | POA: Diagnosis not present

## 2023-06-19 DIAGNOSIS — K3189 Other diseases of stomach and duodenum: Secondary | ICD-10-CM | POA: Diagnosis present

## 2023-06-19 DIAGNOSIS — D6869 Other thrombophilia: Secondary | ICD-10-CM | POA: Diagnosis present

## 2023-06-19 DIAGNOSIS — R296 Repeated falls: Secondary | ICD-10-CM | POA: Diagnosis present

## 2023-06-19 DIAGNOSIS — R4589 Other symptoms and signs involving emotional state: Secondary | ICD-10-CM | POA: Diagnosis present

## 2023-06-19 DIAGNOSIS — Z860109 Personal history of other colon polyps: Secondary | ICD-10-CM

## 2023-06-19 DIAGNOSIS — Z79899 Other long term (current) drug therapy: Secondary | ICD-10-CM

## 2023-06-19 DIAGNOSIS — Z8781 Personal history of (healed) traumatic fracture: Secondary | ICD-10-CM

## 2023-06-19 DIAGNOSIS — N390 Urinary tract infection, site not specified: Secondary | ICD-10-CM | POA: Diagnosis present

## 2023-06-19 DIAGNOSIS — Z818 Family history of other mental and behavioral disorders: Secondary | ICD-10-CM

## 2023-06-19 DIAGNOSIS — R45851 Suicidal ideations: Secondary | ICD-10-CM | POA: Diagnosis present

## 2023-06-19 DIAGNOSIS — G629 Polyneuropathy, unspecified: Secondary | ICD-10-CM | POA: Diagnosis present

## 2023-06-19 DIAGNOSIS — K219 Gastro-esophageal reflux disease without esophagitis: Secondary | ICD-10-CM | POA: Diagnosis not present

## 2023-06-19 DIAGNOSIS — Z515 Encounter for palliative care: Secondary | ICD-10-CM | POA: Diagnosis not present

## 2023-06-19 DIAGNOSIS — F419 Anxiety disorder, unspecified: Secondary | ICD-10-CM | POA: Diagnosis present

## 2023-06-19 DIAGNOSIS — K429 Umbilical hernia without obstruction or gangrene: Secondary | ICD-10-CM | POA: Diagnosis present

## 2023-06-19 DIAGNOSIS — Z634 Disappearance and death of family member: Secondary | ICD-10-CM

## 2023-06-19 DIAGNOSIS — Z5982 Transportation insecurity: Secondary | ICD-10-CM

## 2023-06-19 DIAGNOSIS — G3184 Mild cognitive impairment, so stated: Secondary | ICD-10-CM | POA: Diagnosis present

## 2023-06-19 DIAGNOSIS — Z8711 Personal history of peptic ulcer disease: Secondary | ICD-10-CM

## 2023-06-19 DIAGNOSIS — R339 Retention of urine, unspecified: Secondary | ICD-10-CM | POA: Diagnosis not present

## 2023-06-19 DIAGNOSIS — R634 Abnormal weight loss: Secondary | ICD-10-CM | POA: Diagnosis present

## 2023-06-19 DIAGNOSIS — N200 Calculus of kidney: Secondary | ICD-10-CM | POA: Diagnosis present

## 2023-06-19 DIAGNOSIS — N182 Chronic kidney disease, stage 2 (mild): Secondary | ICD-10-CM | POA: Diagnosis present

## 2023-06-19 HISTORY — DX: Unspecified cirrhosis of liver: K74.60

## 2023-06-19 LAB — COMPREHENSIVE METABOLIC PANEL
ALT: 37 U/L (ref 0–44)
AST: 82 U/L — ABNORMAL HIGH (ref 15–41)
Albumin: 2.4 g/dL — ABNORMAL LOW (ref 3.5–5.0)
Alkaline Phosphatase: 153 U/L — ABNORMAL HIGH (ref 38–126)
Anion gap: 13 (ref 5–15)
BUN: 42 mg/dL — ABNORMAL HIGH (ref 6–20)
CO2: 17 mmol/L — ABNORMAL LOW (ref 22–32)
Calcium: 8.9 mg/dL (ref 8.9–10.3)
Chloride: 100 mmol/L (ref 98–111)
Creatinine, Ser: 1.35 mg/dL — ABNORMAL HIGH (ref 0.61–1.24)
GFR, Estimated: >60 mL/min (ref >60)
Glucose, Bld: 142 mg/dL — ABNORMAL HIGH (ref 70–99)
Potassium: 4 mmol/L (ref 3.5–5.1)
Sodium: 130 mmol/L — ABNORMAL LOW (ref 135–145)
Total Bilirubin: 7.9 mg/dL — ABNORMAL HIGH (ref 0.0–1.2)
Total Protein: 5.4 g/dL — ABNORMAL LOW (ref 6.5–8.1)

## 2023-06-19 LAB — CBC WITH DIFFERENTIAL/PLATELET
Abs Immature Granulocytes: 0.07 10*3/uL (ref 0.00–0.07)
Basophils Absolute: 0 10*3/uL (ref 0.0–0.1)
Basophils Relative: 0 %
Eosinophils Absolute: 0.1 10*3/uL (ref 0.0–0.5)
Eosinophils Relative: 1 %
HCT: 33.1 % — ABNORMAL LOW (ref 39.0–52.0)
Hemoglobin: 11.1 g/dL — ABNORMAL LOW (ref 13.0–17.0)
Immature Granulocytes: 1 %
Lymphocytes Relative: 5 %
Lymphs Abs: 0.7 10*3/uL (ref 0.7–4.0)
MCH: 35.1 pg — ABNORMAL HIGH (ref 26.0–34.0)
MCHC: 33.5 g/dL (ref 30.0–36.0)
MCV: 104.7 fL — ABNORMAL HIGH (ref 80.0–100.0)
Monocytes Absolute: 1.8 10*3/uL — ABNORMAL HIGH (ref 0.1–1.0)
Monocytes Relative: 13 %
Neutro Abs: 12 10*3/uL — ABNORMAL HIGH (ref 1.7–7.7)
Neutrophils Relative %: 80 %
Platelets: 102 10*3/uL — ABNORMAL LOW (ref 150–400)
RBC: 3.16 MIL/uL — ABNORMAL LOW (ref 4.22–5.81)
RDW: 17.9 % — ABNORMAL HIGH (ref 11.5–15.5)
WBC: 14.7 10*3/uL — ABNORMAL HIGH (ref 4.0–10.5)
nRBC: 0 % (ref 0.0–0.2)

## 2023-06-19 LAB — SALICYLATE LEVEL: Salicylate Lvl: <7 mg/dL — ABNORMAL LOW (ref 7.0–30.0)

## 2023-06-19 LAB — PROTIME-INR
INR: 2.6 — ABNORMAL HIGH (ref 0.8–1.2)
Prothrombin Time: 28.4 s — ABNORMAL HIGH (ref 11.4–15.2)

## 2023-06-19 LAB — AMMONIA: Ammonia: 71 umol/L — ABNORMAL HIGH (ref 9–35)

## 2023-06-19 LAB — ACETAMINOPHEN LEVEL: Acetaminophen (Tylenol), Serum: <10 ug/mL — ABNORMAL LOW (ref 10–30)

## 2023-06-19 LAB — CBG MONITORING, ED: Glucose-Capillary: 172 mg/dL — ABNORMAL HIGH (ref 70–99)

## 2023-06-19 MED ORDER — SODIUM CHLORIDE 0.9 % IV BOLUS
1000.0000 mL | Freq: Once | INTRAVENOUS | Status: AC
  Administered 2023-06-19: 1000 mL via INTRAVENOUS

## 2023-06-19 MED ORDER — VANCOMYCIN HCL IN DEXTROSE 1-5 GM/200ML-% IV SOLN: 1000.0000 mg | Freq: Once | INTRAVENOUS | Status: DC

## 2023-06-19 MED ORDER — IOHEXOL 300 MG/ML  SOLN
100.0000 mL | Freq: Once | INTRAMUSCULAR | Status: AC | PRN
  Administered 2023-06-19: 100 mL via INTRAVENOUS

## 2023-06-19 MED ORDER — VANCOMYCIN HCL IN DEXTROSE 1-5 GM/200ML-% IV SOLN
1000.0000 mg | INTRAVENOUS | Status: AC
  Administered 2023-06-20 (×2): 1000 mg via INTRAVENOUS
  Filled 2023-06-19: qty 200

## 2023-06-19 MED ORDER — SODIUM CHLORIDE 0.9 % IV SOLN
2.0000 g | Freq: Once | INTRAVENOUS | Status: AC
  Administered 2023-06-19: 2 g via INTRAVENOUS
  Filled 2023-06-19: qty 12.5

## 2023-06-19 NOTE — BH Assessment (Signed)
@   9:22 PM: The patient has been referred to [facility] for a telepsychiatry consult. [facility] Care Coordinator, Jon, will keep everyone informed about when the consult will take place. The patient's care team in the Emergency Department has provided updates. The [facility] provider will notify the care team once the consult is complete. Please reach out to the [facility] Care Coordinator if there are any questions or updates needed, [phone].

## 2023-06-19 NOTE — ED Notes (Signed)
 Patient transported to CT

## 2023-06-19 NOTE — ED Provider Notes (Signed)
 [place] [facility] AT [facility] Provider Note   CSN: [id number] Arrival date & time: 06/19/23  1756     History  Chief Complaint  Patient presents with   Medical Clearance    Alejandro Porter is a 48 y.o. male.  HPI 48 year old male presents with depression and suicidal thoughts.  History is limited as the patient seems confused and is hard to follow at times.  He is talking to me about how his brother recently faked his own death because he is trying to get money from his dad's passing.  He tells me everything about his own symptoms worsened when his dad passed away several months ago.  He states he has been losing weight, about 30 pounds over several months.  He has become weaker and falls often.  He states usually he needs help to walk but his brother and girlfriend moved out over the last few months and so no one is able to take care of him.  He states he has chronic headaches over the last several months.  He hurts all over.  However the history is limited as the patient also is disoriented to time.  Home Medications Prior to Admission medications   Medication Sig Start Date End Date Taking? Authorizing Provider  DULoxetine  (CYMBALTA ) 60 MG capsule Take 120 mg by mouth daily. 05/03/22  Yes [provider]  lactulose  (CHRONULAC ) 10 GM/15ML solution Take 60 mLs (40 g total) by mouth 3 (three) times daily. Titrate to goal of 3-4 BMs daily. 09/18/22  Yes Rudy Josette RAMAN, PA-C  Cholecalciferol  (VITAMIN D3) 125 MCG (5000 UT) TABS Take 5,000 Units by mouth in the morning. 06/17/19   [provider]  cyclobenzaprine  (FLEXERIL ) 10 MG tablet Take 10 mg by mouth 3 (three) times daily as needed for muscle spasms. 09/29/20   [provider]  dicyclomine  (BENTYL ) 10 MG capsule TAKE 1 CAPSULE BY MOUTH DAILY BEFORE BREAKFAST Patient taking differently: Take 10 mg by mouth in the morning. 03/15/23   Rudy Josette RAMAN, PA-C  FEROSUL 325 (65 Fe)  MG tablet Take 325 mg by mouth 2 (two) times daily. 09/26/22   [provider]  furosemide  (LASIX ) 40 MG tablet Take 20 mg by mouth 2 (two) times daily. 01/23/22   [provider]  gabapentin  (NEURONTIN ) 300 MG capsule Take 300 mg by mouth 3 (three) times daily as needed (pain.).    [provider]  hydrOXYzine  (ATARAX ) 25 MG tablet Take 25-50 mg by mouth at bedtime as needed for anxiety. 04/02/23   [provider]  levOCARNitine  (CARNITOR ) 330 MG tablet Take 330 mg by mouth 3 (three) times daily. 09/01/20   [provider]  ondansetron  (ZOFRAN ) 4 MG tablet TAKE 1 TABLET BY MOUTH THREE TIMES DAILY BEFORE MEALS 12/19/22   Rudy Josette RAMAN, PA-C  oxyCODONE  (OXY IR/ROXICODONE ) 5 MG immediate release tablet Take 1 tablet (5 mg total) by mouth every 8 (eight) hours as needed for severe pain (pain score 7-10). Patient not taking: Reported on 06/19/2023 04/14/23   Ricky Fines, MD  pantoprazole  (PROTONIX ) 40 MG tablet Take 1 tablet (40 mg total) by mouth 2 (two) times daily before a meal. 01/07/23   Rudy, Josette RAMAN, PA-C  SODIUM FLUORIDE 5000 PPM 1.1 % GEL dental gel Place 1 Application onto teeth See admin instructions. After regular brushing, flossing and rinsing, brush with pea size amount of this paste for 2 minutes. Spit it out throughly. Do not Rinse. Nothing to  eat or drink for at least 30 minute after. Do this twice daily. 02/19/23   [provider]  spironolactone  (ALDACTONE ) 100 MG tablet Take 100 mg by mouth daily.    [provider]  XIFAXAN  550 MG TABS tablet TAKE 1 TABLET BY MOUTH TWICE DAILY 09/11/22   Harper, Kristen S, PA-C  Zinc  50 MG TABS Take 50 mg by mouth in the morning.    [provider]      Allergies    Chlorthalidone and Metoprolol     Review of Systems   Review of Systems  Unable to perform ROS: Mental status change    Physical Exam Updated Vital Signs BP 132/74 (BP Location: Left Arm)   Pulse (!) 106    Temp 98.7 F (37.1 C) (Oral)   Resp 18   Ht 5' 8 (1.727 m)   Wt 97 kg   SpO2 98%   BMI 32.52 kg/m  Physical Exam Vitals and nursing note reviewed.  Constitutional:      Appearance: He is well-developed.  HENT:     Head: Normocephalic and atraumatic.     Mouth/Throat:     Mouth: Mucous membranes are dry.  Eyes:     Extraocular Movements: Extraocular movements intact.  Cardiovascular:     Rate and Rhythm: Regular rhythm. Tachycardia present.     Heart sounds: Normal heart sounds.  Pulmonary:     Effort: Pulmonary effort is normal.     Breath sounds: Normal breath sounds. No wheezing.  Abdominal:     Palpations: Abdomen is soft.     Tenderness: There is abdominal tenderness.  Musculoskeletal:     Right lower leg: Edema present.     Left lower leg: Edema present.     Comments: Right anterior lower leg has warmth and erythema. No tenderness/crepitus  Skin:    General: Skin is warm and dry.  Neurological:     Mental Status: He is alert.     Comments: CN 3-12 grossly intact. 5/5 strength in both upper extremities. 4/5 strength in bilateral lower extremities. Normal finger to nose. Bilateral hand tremor with movement     ED Results / Procedures / Treatments   Labs (all labs ordered are listed, but only abnormal results are displayed) Labs Reviewed  ACETAMINOPHEN  LEVEL - Abnormal; Notable for the following components:      Result Value   Acetaminophen  (Tylenol ), Serum <10 (*)    All other components within normal limits  AMMONIA - Abnormal; Notable for the following components:   Ammonia 71 (*)    All other components within normal limits  CBC WITH DIFFERENTIAL/PLATELET - Abnormal; Notable for the following components:   WBC 14.7 (*)    RBC 3.16 (*)    Hemoglobin 11.1 (*)    HCT 33.1 (*)    MCV 104.7 (*)    MCH 35.1 (*)    RDW 17.9 (*)    Platelets 102 (*)    Neutro Abs 12.0 (*)    Monocytes Absolute 1.8 (*)    All other components within normal limits   COMPREHENSIVE METABOLIC PANEL - Abnormal; Notable for the following components:   Sodium 130 (*)    CO2 17 (*)    Glucose, Bld 142 (*)    BUN 42 (*)    Creatinine, Ser 1.35 (*)    Total Protein 5.4 (*)    Albumin  2.4 (*)    AST 82 (*)    Alkaline Phosphatase 153 (*)    Total  Bilirubin 7.9 (*)    All other components within normal limits  PROTIME-INR - Abnormal; Notable for the following components:   Prothrombin Time 28.4 (*)    INR 2.6 (*)    All other components within normal limits  SALICYLATE LEVEL - Abnormal; Notable for the following components:   Salicylate Lvl <7.0 (*)    All other components within normal limits  CBG MONITORING, ED - Abnormal; Notable for the following components:   Glucose-Capillary 172 (*)    All other components within normal limits  CULTURE, BLOOD (ROUTINE X 2)  CULTURE, BLOOD (ROUTINE X 2)  RAPID URINE DRUG SCREEN, HOSP PERFORMED  URINALYSIS, W/ REFLEX TO CULTURE (INFECTION SUSPECTED)  CBC WITH DIFFERENTIAL/PLATELET  I-STAT CHEM 8, ED    EKG EKG Interpretation Date/Time:  Tuesday June 19 2023 19:40:57 EST Ventricular Rate:  105 PR Interval:  145 QRS Duration:  112 QT Interval:  397 QTC Calculation: 525 R Axis:   16  Text Interpretation: Sinus tachycardia Borderline intraventricular conduction delay Nonspecific T abnormalities, lateral leads Prolonged QT interval Artifact in lead(s) I V1 overall similar to Aug 2024 Confirmed by Freddi Hamilton [phone]) on 06/19/2023 8:06:04 PM  Radiology CT ABDOMEN PELVIS W CONTRAST Result Date: 06/19/2023 CLINICAL DATA:  Abdominal pain, suicidal ideation EXAM: CT ABDOMEN AND PELVIS WITH CONTRAST TECHNIQUE: Multidetector CT imaging of the abdomen and pelvis was performed using the standard protocol following bolus administration of intravenous contrast. RADIATION DOSE REDUCTION: This exam was performed according to the departmental dose-optimization program which includes automated exposure control,  adjustment of the mA and/or kV according to patient size and/or use of iterative reconstruction technique. CONTRAST:  OMNIPAQUE  IOHEXOL  300 MG/ML  SOLN COMPARISON:  MRI abdomen dated 01/25/2023. CTA abdomen/pelvis dated 11/10/2022. FINDINGS: Motion degraded images. Lower chest: Mild patchy lingular and left lower lobe opacities, favoring atelectasis. Hepatobiliary: Cirrhosis. TIPS. Embolization coils along the falciform ligament (of a recanalized periumbilical vein) with streak artifact along the liver. Within that constraint, there are no findings suspicious for [facility]. Layering tiny gallstones (series 3/image 30), without associated inflammatory changes. No intrahepatic or extrahepatic duct dilatation. Pancreas: Within normal limits. Spleen: Within the upper limits of normal for size. Adrenals/Urinary Tract: 4.5 cm left adrenal nodule, containing macroscopic fat, compatible with a benign adrenal myelolipoma. Right adrenal glands are within normal limits. Right kidney is within normal limits. Two nonobstructing left lower pole renal calculi measuring up to 2 mm (series 3/image 32). No hydronephrosis. Bladder is within normal limits. Stomach/Bowel: Stomach is within normal limits. No evidence of bowel obstruction. Mild wall thickening involving the descending duodenum (series 3/image 38), correlate for duodenitis. Suspected appendix is within normal limits (series 3/image 55). No colonic wall thickening or inflammatory changes. Vascular/Lymphatic: No evidence of abdominal aortic aneurysm. Atherosclerotic calcifications of the abdominal aorta and branch vessels, although vessels remain patent. No suspicious abdominopelvic lymphadenopathy. Reproductive: Prostate is grossly unremarkable. Other: Trace pelvic ascites. Suspected tiny foci of free air overlying the right liver (series 3/images 15 and 20), correlate for recent prior intervention/paracentesis. Musculoskeletal: Multiple subacute/healing bilateral rib  fracture deformities. Healed left lateral transverse process deformities at L1-2. No acute fracture is seen. IMPRESSION: Motion degraded images. Multiple subacute/healing bilateral rib fracture deformities. Healed left lateral transverse process deformities at L1-2. No acute fracture is seen. Suspected tiny foci of free air overlying the right liver, correlate for recent prior intervention/paracentesis. Mild wall thickening involving the descending duodenum, correlate for duodenitis. Cirrhosis. TIPS. No findings suspicious for HCC. Additional ancillary findings as  above. Electronically Signed   By: Pinkie Pebbles M.D.   On: 06/19/2023 22:10   CT Head Wo Contrast Result Date: 06/19/2023 CLINICAL DATA:  Mental status change, unknown cause EXAM: CT HEAD WITHOUT CONTRAST TECHNIQUE: Contiguous axial images were obtained from the base of the skull through the vertex without intravenous contrast. RADIATION DOSE REDUCTION: This exam was performed according to the departmental dose-optimization program which includes automated exposure control, adjustment of the mA and/or kV according to patient size and/or use of iterative reconstruction technique. COMPARISON:  Head CT 04/10/2023 FINDINGS: Brain: No hemorrhage. No hydrocephalus. No extra-axial fluid collection. No mass effect. No mass lesion. No CT evidence of acute cortical infarct. Vascular: No hyperdense vessel or unexpected calcification. Skull: Normal. Negative for fracture or focal lesion. Sinuses/Orbits: No middle ear or mastoid effusion. Paranasal sinuses are clear. Orbits are unremarkable. Other: None. IMPRESSION: No acute intracranial abnormality. Electronically Signed   By: Lyndall Gore M.D.   On: 06/19/2023 21:48   DG Chest Portable 1 View Result Date: 06/19/2023 CLINICAL DATA:  Weakness EXAM: PORTABLE CHEST 1 VIEW COMPARISON:  10/05/2022 FINDINGS: Low lung volumes. Borderline to mild cardiomegaly. Streaky atelectasis or scarring at the left base. No  pleural effusion or pneumothorax IMPRESSION: Low lung volumes with streaky atelectasis or scarring at the left base. Electronically Signed   By: Luke Bun M.D.   On: 06/19/2023 20:42    Procedures Procedures    Medications Ordered in ED Medications  vancomycin  (VANCOCIN ) IVPB 1000 mg/200 mL premix (has no administration in time range)  sodium chloride  0.9 % bolus 1,000 mL (0 mLs Intravenous Stopped 06/19/23 2259)  iohexol  (OMNIPAQUE ) 300 MG/ML solution 100 mL (100 mLs Intravenous Contrast Given 06/19/23 2125)  ceFEPIme  (MAXIPIME ) 2 g in sodium chloride  0.9 % 100 mL IVPB (2 g Intravenous New Bag/Given 06/19/23 2259)    ED Course/ Medical Decision Making/ A&P                                 Medical Decision Making Amount and/or Complexity of Data Reviewed External Data Reviewed: labs and notes. Labs: ordered.    Details: Leukocytosis.  Worsening bilirubin and an AKI. Radiology: ordered and independent interpretation performed.    Details: No head bleed ECG/medicine tests: ordered and independent interpretation performed.    Details: Tachycardia  Risk Prescription drug management. Decision regarding hospitalization.   Patient presents with depression complaints and suicidal thoughts.  However he also seems to be altered here and is found to have what appears to be right leg cellulitis.  He also has an acute kidney injury and leukocytosis.  CT head is unremarkable.  His bilirubin is also worse than his normal baseline.  CT of his abdomen and pelvis shows questionable dot of free air but is also limited by motion artifact.  He did have a recent hernia repair in the last few weeks that could be the cause though his abdominal exam is not consistent with peritonitis to suggest a perforation.  I think this can be followed with serial abdominal exams.  He was started on antibiotics for the cellulitis and he will need admission to the hospital service.  Discussed with Dr. Adefeso.  Psychiatry  will likely need to be consulted in the hospital as well.        Final Clinical Impression(s) / ED Diagnoses Final diagnoses:  Cellulitis of right leg  Acute kidney injury (HCC)    Rx /  DC Orders ED Discharge Orders     None         Freddi Hamilton, MD 06/19/23 2355

## 2023-06-19 NOTE — H&P (Signed)
 History and Physical    Patient: Alejandro Porter FMW:[id number] DOB: 1976-03-13 DOA: 06/19/2023 DOS: the patient was seen and examined on 06/20/2023 PCP: Carol Catalan, PA-C  Patient coming from: Home  Chief Complaint:  Chief Complaint  Patient presents with   Medical Clearance   HPI: Alejandro Porter is a 48 y.o. male with medical history significant of  cirrhosis of liver, iron  deficiency anemia, GERD, portal hypertension, prior TIPS procedure, CKD who presents to the emergency department due to suicidal thoughts at home.  Patient was unable to provide a history possibly due to confusion, history was obtained from ED physician and ED medical record.  Per report, patient had passed away a few months ago and has since been having worsening symptoms including about 30 pound weight loss over a few months, he has had multiple falls and has become more weaker. Patient ambulates with assistance, but since his brother and girlfriend moved out few months ago, he has had no help.  He complained of several months of chronic headaches and generalized bodyaches.  ED Course:  In the emergency department, he was tachycardic with a pulse of 100 bpm, BP was 113/76, other vital signs are within normal range.  Workup in the ED showed WBC 14.7, hemoglobin 11.1, hematocrit 33.1, MCV 104.7, platelets 202.  BMP showed sodium 130, potassium 4.0, chloride 100, bicarb 17, blood glucose 142, BUN 42, creatinine 1.35 albumin  2.4 AST 82, ALT 37, ALP 153.  Salicylate less than 7.0, acetaminophen  less than 10, ammonium 71. CT abdomen and pelvis with contrast showed Multiple subacute/healing bilateral rib fracture deformities. Healed left lateral transverse process deformities at L1-2. No acute fracture is seen. Suspected tiny foci of free air overlying the right liver, correlate for recent prior intervention/paracentesis. Mild wall thickening involving the descending duodenum, correlate for duodenitis. CT head  without contrast showed no acute intraocular abnormality Chest x-ray showed low lung volumes with streaky atelectasis or scarring at the left base Patient was treated with IV cefepime  and vancomycin .  IV hydration was provided. Hospitalist was asked admit patient for further evaluation and management.  Review of Systems: Review of systems as noted in the HPI. All other systems reviewed and are negative.   Past Medical History:  Diagnosis Date   Anxiety 08/2022   Cirrhosis (HCC)    CKD (chronic kidney disease) stage 2, GFR 60-89 ml/min 05/24/2022   stage 3a   Depression 06/2022   GERD (gastroesophageal reflux disease)    H/O colonoscopy    H/O endoscopy    Headache    Hypertension    Liver cirrhosis (HCC)    Neuropathy    OSA (obstructive sleep apnea)    not currently using CPAP   S/P abdominal paracentesis    Past Surgical History:  Procedure Laterality Date   BIOPSY  09/12/2019   Procedure: BIOPSY;  Surgeon: Shaaron Lamar HERO, MD;  [facility]: [facility];  Service: Endoscopy;;   COLONOSCOPY N/A 09/18/2019   Dr. Harvey: 12 colon polyps removed, multiple simple adenomas and some inflammatory polyps, diverticulosis, external and internal hemorrhoids.  Next colonoscopy in 3 years.   COLONOSCOPY WITH PROPOFOL  N/A 03/24/2022   Procedure: COLONOSCOPY WITH PROPOFOL ;  Surgeon: Cindie Carlin POUR, DO;  [facility]: [facility];  Service: Endoscopy;  Laterality: N/A;  1:30pm, asa 3   ESOPHAGEAL BANDING N/A 09/12/2019   Procedure: ESOPHAGEAL BANDING;  Surgeon: Shaaron Lamar HERO, MD;  [facility]: [facility];  Service: Endoscopy;  Laterality: N/A;   ESOPHAGOGASTRODUODENOSCOPY N/A 09/12/2019  Dr. Shaaron: Mild erosive reflux esophagitis.  Schatzki ring status post dilation.  Small grade 1/grade 2 esophageal varices, portal hypertensive gastropathy, hyperplastic gastric polyp, gastric erosions with chronic inactive gastritis on biopsies, no H. pylori.   ESOPHAGOGASTRODUODENOSCOPY (EGD) WITH  PROPOFOL  N/A 12/16/2020   two short columns of no more than Grade 2 esophageal varices, appearing innocent. Grade 1 varices not apparent today. Multiple large posterior body and antral hyperplastic, hemorrhagic appearing polyps, larges approximately 2.5 cm pedunculated. Portal gastropathy. Normal duodenum. Polypectomy with clips placed. EGD in 18 months.   ESOPHAGOGASTRODUODENOSCOPY (EGD) WITH PROPOFOL  N/A 03/24/2022   Procedure: ESOPHAGOGASTRODUODENOSCOPY (EGD) WITH PROPOFOL ;  Surgeon: Cindie Carlin POUR, DO;  [facility];  Service: Endoscopy;  Laterality: N/A;   IR ANGIOGRAM SELECTIVE EACH ADDITIONAL VESSEL  09/04/2022   IR EMBO VENOUS NOT HEMORR HEMANG  INC GUIDE ROADMAPPING  09/04/2022   IR INTRAVASCULAR ULTRASOUND NON CORONARY  09/04/2022   IR PARACENTESIS  09/04/2022   IR RADIOLOGIST EVAL & MGMT  07/19/2022   IR RADIOLOGIST EVAL & MGMT  08/14/2022   IR RADIOLOGIST EVAL & MGMT  09/29/2022   IR RADIOLOGIST EVAL & MGMT  10/31/2022   IR RADIOLOGIST EVAL & MGMT  11/20/2022   IR RADIOLOGIST EVAL & MGMT  02/21/2023   IR TIPS  09/04/2022   IR US  GUIDE VASC ACCESS RIGHT  09/04/2022   POLYPECTOMY  09/18/2019   Procedure: POLYPECTOMY;  Surgeon: Harvey Margo CROME, MD;  [facility];  Service: Endoscopy;;   POLYPECTOMY  12/16/2020   Procedure: POLYPECTOMY;  Surgeon: Shaaron Lamar HERO, MD;  [facility];  Service: Endoscopy;;  gastric   POLYPECTOMY  03/24/2022   Procedure: POLYPECTOMY;  Surgeon: Cindie Carlin POUR, DO;  [facility]: [facility];  Service: Endoscopy;;   RADIOLOGY WITH ANESTHESIA N/A 09/04/2022   Procedure: TIPS;  Surgeon: Jennefer Ester PARAS, MD;  Location: [facility];  Service: Radiology;  Laterality: N/A;    Social History:  reports that he has never smoked. He has been exposed to tobacco smoke. He has never used smokeless tobacco. He reports that he does not currently use alcohol . He reports that he does not use drugs.   Allergies  Allergen Reactions   Chlorthalidone      Severe headaches    Metoprolol      Severe headaches     Family History  Problem Relation Age of Onset   Brain cancer Mother    Diabetes Mother    Diabetes Father    Pulmonary embolism Brother    Liver disease Neg Hx      Prior to Admission medications   Medication Sig Start Date End Date Taking? Authorizing Provider  DULoxetine  (CYMBALTA ) 60 MG capsule Take 120 mg by mouth daily. 05/03/22  Yes [provider]  lactulose  (CHRONULAC ) 10 GM/15ML solution Take 60 mLs (40 g total) by mouth 3 (three) times daily. Titrate to goal of 3-4 BMs daily. 09/18/22  Yes Rudy Josette RAMAN, PA-C  Cholecalciferol  (VITAMIN D3) 125 MCG (5000 UT) TABS Take 5,000 Units by mouth in the morning. 06/17/19   [provider]  cyclobenzaprine  (FLEXERIL ) 10 MG tablet Take 10 mg by mouth 3 (three) times daily as needed for muscle spasms. 09/29/20   [provider]  dicyclomine  (BENTYL ) 10 MG capsule TAKE 1 CAPSULE BY MOUTH DAILY BEFORE BREAKFAST Patient taking differently: Take 10 mg by mouth in the morning. 03/15/23   Rudy Josette RAMAN, PA-C  FEROSUL 325 (65 Fe) MG tablet Take 325  mg by mouth 2 (two) times daily. 09/26/22   [provider]  furosemide  (LASIX ) 40 MG tablet Take 20 mg by mouth 2 (two) times daily. 01/23/22   [provider]  gabapentin  (NEURONTIN ) 300 MG capsule Take 300 mg by mouth 3 (three) times daily as needed (pain.).    [provider]  hydrOXYzine  (ATARAX ) 25 MG tablet Take 25-50 mg by mouth at bedtime as needed for anxiety. 04/02/23   [provider]  levOCARNitine  (CARNITOR ) 330 MG tablet Take 330 mg by mouth 3 (three) times daily. 09/01/20   [provider]  ondansetron  (ZOFRAN ) 4 MG tablet TAKE 1 TABLET BY MOUTH THREE TIMES DAILY BEFORE MEALS 12/19/22   Rudy Neptune S, PA-C  oxyCODONE  (OXY IR/ROXICODONE ) 5 MG immediate release tablet Take 1 tablet (5 mg total) by mouth every 8 (eight) hours as needed for severe pain (pain  score 7-10). Patient not taking: Reported on 06/19/2023 04/14/23   Ricky Fines, MD  pantoprazole  (PROTONIX ) 40 MG tablet Take 1 tablet (40 mg total) by mouth 2 (two) times daily before a meal. 01/07/23   Rudy, Neptune RAMAN, PA-C  SODIUM FLUORIDE 5000 PPM 1.1 % GEL dental gel Place 1 Application onto teeth See admin instructions. After regular brushing, flossing and rinsing, brush with pea size amount of this paste for 2 minutes. Spit it out throughly. Do not Rinse. Nothing to eat or drink for at least 30 minute after. Do this twice daily. 02/19/23   [provider]  spironolactone  (ALDACTONE ) 100 MG tablet Take 100 mg by mouth daily.    [provider]  XIFAXAN  550 MG TABS tablet TAKE 1 TABLET BY MOUTH TWICE DAILY 09/11/22   Harper, Kristen S, PA-C  Zinc  50 MG TABS Take 50 mg by mouth in the morning.    [provider]    Physical Exam: BP 109/68 (BP Location: Right Arm)   Pulse 97   Temp (!) 97.5 F (36.4 C) (Oral)   Resp 20   Ht 5' 8 (1.727 m)   Wt 86.6 kg   SpO2 96%   BMI 29.03 kg/m   General: 48 y.o. year-old male chronically ill appearing, but in no acute distress.  Alert and oriented x 1 (self). HEENT: NCAT, EOMI, dry mucous membrane Neck: Supple, trachea medial Cardiovascular: Regular rate and rhythm with no rubs or gallops.  No thyromegaly or JVD noted.  +2 bilateral lower extremity edema. 2/4 pulses in all 4 extremities. Respiratory: Clear to auscultation with no wheezes or rales. Good inspiratory effort. Abdomen: Soft, tender to palpation nondistended with normal bowel sounds x4 quadrants. Muskuloskeletal: Warmth and erythema noted in right leg.  No cyanosis, clubbing noted bilaterally Neuro: CN II-XII intact, strength 5/5 x 4, sensation, reflexes intact Skin: No ulcerative lesions noted or rashes Psychiatry:  Mood is appropriate for condition and setting          Labs on Admission:  Basic Metabolic Panel: Recent Labs  Lab 06/19/23 2011  NA 130*   K 4.0  CL 100  CO2 17*  GLUCOSE 142*  BUN 42*  CREATININE 1.35*  CALCIUM 8.9   Liver Function Tests: Recent Labs  Lab 06/19/23 2011  AST 82*  ALT 37  ALKPHOS 153*  BILITOT 7.9*  PROT 5.4*  ALBUMIN  2.4*   No results for input(s): LIPASE, AMYLASE in the last 168 hours. Recent Labs  Lab 06/19/23 2011  AMMONIA 71*   CBC: Recent Labs  Lab 06/19/23 2011  WBC 14.7*  NEUTROABS 12.0*  HGB 11.1*  HCT 33.1*  MCV 104.7*  PLT 102*   Cardiac Enzymes: No results for input(s): CKTOTAL, CKMB, CKMBINDEX, TROPONINI in the last 168 hours.  BNP (last 3 results) Recent Labs    04/13/23 0510 04/14/23 0357  BNP 43.0 27.0    ProBNP (last 3 results) No results for input(s): PROBNP in the last 8760 hours.  CBG: Recent Labs  Lab 06/19/23 1938  GLUCAP 172*    Radiological Exams on Admission: CT ABDOMEN PELVIS W CONTRAST Result Date: 06/19/2023 CLINICAL DATA:  Abdominal pain, suicidal ideation EXAM: CT ABDOMEN AND PELVIS WITH CONTRAST TECHNIQUE: Multidetector CT imaging of the abdomen and pelvis was performed using the standard protocol following bolus administration of intravenous contrast. RADIATION DOSE REDUCTION: This exam was performed according to the departmental dose-optimization program which includes automated exposure control, adjustment of the mA and/or kV according to patient size and/or use of iterative reconstruction technique. CONTRAST:  OMNIPAQUE  IOHEXOL  300 MG/ML  SOLN COMPARISON:  MRI abdomen dated 01/25/2023. CTA abdomen/pelvis dated 11/10/2022. FINDINGS: Motion degraded images. Lower chest: Mild patchy lingular and left lower lobe opacities, favoring atelectasis. Hepatobiliary: Cirrhosis. TIPS. Embolization coils along the falciform ligament (of a recanalized periumbilical vein) with streak artifact along the liver. Within that constraint, there are no findings suspicious for [facility]. Layering tiny gallstones (series 3/image 30), without associated  inflammatory changes. No intrahepatic or extrahepatic duct dilatation. Pancreas: Within normal limits. Spleen: Within the upper limits of normal for size. Adrenals/Urinary Tract: 4.5 cm left adrenal nodule, containing macroscopic fat, compatible with a benign adrenal myelolipoma. Right adrenal glands are within normal limits. Right kidney is within normal limits. Two nonobstructing left lower pole renal calculi measuring up to 2 mm (series 3/image 32). No hydronephrosis. Bladder is within normal limits. Stomach/Bowel: Stomach is within normal limits. No evidence of bowel obstruction. Mild wall thickening involving the descending duodenum (series 3/image 38), correlate for duodenitis. Suspected appendix is within normal limits (series 3/image 55). No colonic wall thickening or inflammatory changes. Vascular/Lymphatic: No evidence of abdominal aortic aneurysm. Atherosclerotic calcifications of the abdominal aorta and branch vessels, although vessels remain patent. No suspicious abdominopelvic lymphadenopathy. Reproductive: Prostate is grossly unremarkable. Other: Trace pelvic ascites. Suspected tiny foci of free air overlying the right liver (series 3/images 15 and 20), correlate for recent prior intervention/paracentesis. Musculoskeletal: Multiple subacute/healing bilateral rib fracture deformities. Healed left lateral transverse process deformities at L1-2. No acute fracture is seen. IMPRESSION: Motion degraded images. Multiple subacute/healing bilateral rib fracture deformities. Healed left lateral transverse process deformities at L1-2. No acute fracture is seen. Suspected tiny foci of free air overlying the right liver, correlate for recent prior intervention/paracentesis. Mild wall thickening involving the descending duodenum, correlate for duodenitis. Cirrhosis. TIPS. No findings suspicious for HCC. Additional ancillary findings as above. Electronically Signed   By: Pinkie Pebbles M.D.   On: 06/19/2023 22:10    CT Head Wo Contrast Result Date: 06/19/2023 CLINICAL DATA:  Mental status change, unknown cause EXAM: CT HEAD WITHOUT CONTRAST TECHNIQUE: Contiguous axial images were obtained from the base of the skull through the vertex without intravenous contrast. RADIATION DOSE REDUCTION: This exam was performed according to the departmental dose-optimization program which includes automated exposure control, adjustment of the mA and/or kV according to patient size and/or use of iterative reconstruction technique. COMPARISON:  Head CT 04/10/2023 FINDINGS: Brain: No hemorrhage. No hydrocephalus. No extra-axial fluid collection. No mass effect. No mass lesion. No CT evidence of acute cortical infarct. Vascular: No hyperdense  vessel or unexpected calcification. Skull: Normal. Negative for fracture or focal lesion. Sinuses/Orbits: No middle ear or mastoid effusion. Paranasal sinuses are clear. Orbits are unremarkable. Other: None. IMPRESSION: No acute intracranial abnormality. Electronically Signed   By: Lyndall Gore M.D.   On: 06/19/2023 21:48   DG Chest Portable 1 View Result Date: 06/19/2023 CLINICAL DATA:  Weakness EXAM: PORTABLE CHEST 1 VIEW COMPARISON:  10/05/2022 FINDINGS: Low lung volumes. Borderline to mild cardiomegaly. Streaky atelectasis or scarring at the left base. No pleural effusion or pneumothorax IMPRESSION: Low lung volumes with streaky atelectasis or scarring at the left base. Electronically Signed   By: Luke Bun M.D.   On: 06/19/2023 20:42    EKG: I independently viewed the EKG done and my findings are as followed: Sinus tachycardia with rate of 105 bpm with prolonged QTc of 525 ms  Assessment/Plan Present on Admission:  Acute metabolic encephalopathy  Cellulitis of right lower extremity  Macrocytic anemia  Thrombocytopenia (HCC)  GERD (gastroesophageal reflux disease)  Principal Problem:   Acute metabolic encephalopathy Active Problems:   Thrombocytopenia (HCC)   GERD  (gastroesophageal reflux disease)   Macrocytic anemia   Generalized weakness   Cellulitis of right lower extremity   Failure to thrive in adult   Hypoalbuminemia due to protein-calorie malnutrition (HCC)   Hyperammonemia (HCC)   Transaminitis   AKI (acute kidney injury) (HCC)   Prolonged QT interval   History of cirrhosis  Acute metabolic encephalopathy This may be due to multifactorial including hyperammonemia, undiagnosed depression due to patient's consideration of suicidal ideations Continue lactulose  Continue suicidal precautions Consider psych eval when patient has been cleared medically  Cellulitis of right lower extremity Patient was treated with IV cefepime  and vancomycin , we shall continue with IV ceftriaxone   Generalized weakness Failure to thrive in adult Hypoalbuminemia possibly secondary to moderate protein calorie malnutrition Albumin  2.4 Protein supplement will be provided Continue fall precaution Continue PT/OT eval and treat Dietitian will be consulted admission await further recommendations  Hyperammonemia in the setting of history of cirrhosis Ammonia level at 71 Continue lactulose  Lasix  and spironolactone  held at this time due to soft BP  Transaminitis possibly secondary to liver cirrhosis AST 82, ALP 153 Continue to monitor liver enzymes Diuretics will be held at this time due to hyponatremia  Acute kidney injury Creatinine 1.35 (baseline creatinine at 0.7-0.9 Continue gentle hydration Renally adjust medications, avoid nephrotoxic agents/dehydration/hypotension  Prolonged QT interval QTc 525 ms Avoid QT prolonging drugs Magnesium level will be checked Repeat EKG in the morning  Macrocytic anemia MCV 104.7, vitamin B12 and folate will be checked  Chronic thrombocytopenia Platelets 102, no sign of any bleeding Continue to monitor platelet levels  GERD Continue Protonix     DVT prophylaxis: Lovenox   Code Status: Full code  Family  Communication: None at bedside  Consults: None  Severity of Illness: The appropriate patient status for this patient is INPATIENT. Inpatient status is judged to be reasonable and necessary in order to provide the required intensity of service to ensure the patient's safety. The patient's presenting symptoms, physical exam findings, and initial radiographic and laboratory data in the context of their chronic comorbidities is felt to place them at high risk for further clinical deterioration. Furthermore, it is not anticipated that the patient will be medically stable for discharge from the hospital within 2 midnights of admission.   * I certify that at the point of admission it is my clinical judgment that the patient will require inpatient hospital care spanning  beyond 2 midnights from the point of admission due to high intensity of service, high risk for further deterioration and high frequency of surveillance required.*  Author: Leiam Hopwood, DO 06/20/2023 5:14 AM  For on call review [email or web address].

## 2023-06-19 NOTE — ED Triage Notes (Signed)
 Pt arrived from home via POV accompanied by his cousin who reports Pt has been increasingly depressed and is having active SI. Pt reports he does not wan to be  alive anymore and was going to blow his brains out. Pt does report he has active access to a firearm.

## 2023-06-20 ENCOUNTER — Encounter (HOSPITAL_COMMUNITY): Payer: Self-pay | Admitting: Internal Medicine

## 2023-06-20 ENCOUNTER — Inpatient Hospital Stay (HOSPITAL_COMMUNITY): Payer: Medicaid Other

## 2023-06-20 DIAGNOSIS — N179 Acute kidney failure, unspecified: Secondary | ICD-10-CM | POA: Insufficient documentation

## 2023-06-20 DIAGNOSIS — E7220 Disorder of urea cycle metabolism, unspecified: Secondary | ICD-10-CM | POA: Insufficient documentation

## 2023-06-20 DIAGNOSIS — G9341 Metabolic encephalopathy: Secondary | ICD-10-CM | POA: Diagnosis not present

## 2023-06-20 DIAGNOSIS — R9431 Abnormal electrocardiogram [ECG] [EKG]: Secondary | ICD-10-CM | POA: Insufficient documentation

## 2023-06-20 DIAGNOSIS — R7401 Elevation of levels of liver transaminase levels: Secondary | ICD-10-CM | POA: Insufficient documentation

## 2023-06-20 DIAGNOSIS — Z8719 Personal history of other diseases of the digestive system: Secondary | ICD-10-CM

## 2023-06-20 DIAGNOSIS — E8809 Other disorders of plasma-protein metabolism, not elsewhere classified: Secondary | ICD-10-CM | POA: Insufficient documentation

## 2023-06-20 DIAGNOSIS — R627 Adult failure to thrive: Secondary | ICD-10-CM | POA: Insufficient documentation

## 2023-06-20 DIAGNOSIS — E46 Unspecified protein-calorie malnutrition: Secondary | ICD-10-CM | POA: Insufficient documentation

## 2023-06-20 LAB — CBC
HCT: 32.3 % — ABNORMAL LOW (ref 39.0–52.0)
Hemoglobin: 10.5 g/dL — ABNORMAL LOW (ref 13.0–17.0)
MCH: 33.8 pg (ref 26.0–34.0)
MCHC: 32.5 g/dL (ref 30.0–36.0)
MCV: 103.9 fL — ABNORMAL HIGH (ref 80.0–100.0)
Platelets: 97 10*3/uL — ABNORMAL LOW (ref 150–400)
RBC: 3.11 MIL/uL — ABNORMAL LOW (ref 4.22–5.81)
RDW: 17.6 % — ABNORMAL HIGH (ref 11.5–15.5)
WBC: 12.1 10*3/uL — ABNORMAL HIGH (ref 4.0–10.5)
nRBC: 0 % (ref 0.0–0.2)

## 2023-06-20 LAB — COMPREHENSIVE METABOLIC PANEL
ALT: 34 U/L (ref 0–44)
AST: 82 U/L — ABNORMAL HIGH (ref 15–41)
Albumin: 2.3 g/dL — ABNORMAL LOW (ref 3.5–5.0)
Alkaline Phosphatase: 147 U/L — ABNORMAL HIGH (ref 38–126)
Anion gap: 8 (ref 5–15)
BUN: 40 mg/dL — ABNORMAL HIGH (ref 6–20)
CO2: 22 mmol/L (ref 22–32)
Calcium: 8.9 mg/dL (ref 8.9–10.3)
Chloride: 102 mmol/L (ref 98–111)
Creatinine, Ser: 1.02 mg/dL (ref 0.61–1.24)
GFR, Estimated: >60 mL/min (ref >60)
Glucose, Bld: 116 mg/dL — ABNORMAL HIGH (ref 70–99)
Potassium: 3.9 mmol/L (ref 3.5–5.1)
Sodium: 132 mmol/L — ABNORMAL LOW (ref 135–145)
Total Bilirubin: 7.1 mg/dL — ABNORMAL HIGH (ref 0.0–1.2)
Total Protein: 5.2 g/dL — ABNORMAL LOW (ref 6.5–8.1)

## 2023-06-20 LAB — FOLATE: Folate: 11.3 ng/mL (ref >5.9)

## 2023-06-20 LAB — VITAMIN B12: Vitamin B-12: 2069 pg/mL — ABNORMAL HIGH (ref 180–914)

## 2023-06-20 LAB — PHOSPHORUS: Phosphorus: 3.4 mg/dL (ref 2.5–4.6)

## 2023-06-20 LAB — MAGNESIUM: Magnesium: 2.2 mg/dL (ref 1.7–2.4)

## 2023-06-20 MED ORDER — RIFAXIMIN 550 MG PO TABS
550.0000 mg | ORAL_TABLET | Freq: Two times a day (BID) | ORAL | Status: DC
Start: 2023-06-20 — End: 2023-06-26
  Administered 2023-06-20 – 2023-06-25 (×12): 550 mg via ORAL
  Filled 2023-06-20 (×15): qty 1

## 2023-06-20 MED ORDER — LACTULOSE 10 GM/15ML PO SOLN
40.0000 g | Freq: Three times a day (TID) | ORAL | Status: DC
  Administered 2023-06-20 – 2023-06-22 (×7): 40 g via ORAL
  Filled 2023-06-20 (×7): qty 60

## 2023-06-20 MED ORDER — SODIUM CHLORIDE 0.9 % IV SOLN
1.0000 g | INTRAVENOUS | Status: DC
  Administered 2023-06-20 – 2023-06-21 (×2): 1 g via INTRAVENOUS
  Filled 2023-06-20 (×2): qty 10

## 2023-06-20 MED ORDER — ONDANSETRON HCL 4 MG/2ML IJ SOLN: 4.0000 mg | Freq: Four times a day (QID) | INTRAMUSCULAR | Status: DC | PRN

## 2023-06-20 MED ORDER — OXYCODONE HCL 5 MG PO TABS
5.0000 mg | ORAL_TABLET | Freq: Once | ORAL | Status: AC | PRN
  Administered 2023-06-20: 5 mg via ORAL
  Filled 2023-06-20: qty 1

## 2023-06-20 MED ORDER — PANTOPRAZOLE SODIUM 40 MG PO TBEC
40.0000 mg | DELAYED_RELEASE_TABLET | Freq: Two times a day (BID) | ORAL | Status: DC
  Administered 2023-06-20 – 2023-06-25 (×12): 40 mg via ORAL
  Filled 2023-06-20 (×13): qty 1

## 2023-06-20 MED ORDER — ENOXAPARIN SODIUM 40 MG/0.4ML IJ SOSY
40.0000 mg | PREFILLED_SYRINGE | INTRAMUSCULAR | Status: DC
  Administered 2023-06-20 – 2023-06-22 (×3): 40 mg via SUBCUTANEOUS
  Filled 2023-06-20 (×4): qty 0.4

## 2023-06-20 MED ORDER — ONDANSETRON HCL 4 MG PO TABS
4.0000 mg | ORAL_TABLET | Freq: Four times a day (QID) | ORAL | Status: DC | PRN
Start: 2023-06-20 — End: 2023-06-26

## 2023-06-20 MED ORDER — ENSURE ENLIVE PO LIQD
237.0000 mL | Freq: Two times a day (BID) | ORAL | Status: DC
  Administered 2023-06-20 – 2023-06-21 (×3): 237 mL via ORAL

## 2023-06-20 MED ORDER — SODIUM CHLORIDE 0.9 % IV SOLN: INTRAVENOUS | Status: DC

## 2023-06-20 NOTE — Plan of Care (Signed)
  Problem: Acute Rehab OT Goals (only OT should resolve) Goal: Pt. Will Perform Grooming Flowsheets (Taken 06/20/2023 1021) Pt Will Perform Grooming:  with modified independence  standing Goal: Pt. Will Perform Lower Body Bathing Flowsheets (Taken 06/20/2023 1021) Pt Will Perform Lower Body Bathing:  with modified independence  sitting/lateral leans Goal: Pt. Will Perform Lower Body Dressing Flowsheets (Taken 06/20/2023 1021) Pt Will Perform Lower Body Dressing:  with modified independence  sitting/lateral leans Goal: Pt. Will Transfer To Toilet Flowsheets (Taken 06/20/2023 1021) Pt Will Transfer to Toilet:  with modified independence  ambulating Goal: Pt. Will Perform Toileting-Clothing Manipulation Flowsheets (Taken 06/20/2023 1021) Pt Will Perform Toileting - Clothing Manipulation and hygiene: with modified independence Goal: Pt/Caregiver Will Perform Home Exercise Program Flowsheets (Taken 06/20/2023 1021) Pt/caregiver will Perform Home Exercise Program:  Increased strength  Both right and left upper extremity  Independently  Kissa Campoy OT, MOT

## 2023-06-20 NOTE — Plan of Care (Signed)
°  Problem: Health Behavior/Discharge Planning: Goal: Ability to manage health-related needs will improve Outcome: Progressing   Problem: Clinical Measurements: Goal: Respiratory complications will improve Outcome: Progressing   Problem: Clinical Measurements: Goal: Cardiovascular complication will be avoided Outcome: Progressing

## 2023-06-20 NOTE — BH Assessment (Addendum)
 Pt was medically admitted therefore IRIS was unable to assess pt at 0000. TTS attempted to see pt at 0250. Per Thayne Fine, RN, pt was given pain medication and is currently unable to engage in an assessment. TTS will be notified when pt is alert and awake for an assessment.

## 2023-06-20 NOTE — Progress Notes (Addendum)
 TTS/ BH Disposition CSW Note:   This pt remains on the TTS/ BH shift report at this time. Pt has not yet been assessed and therefore is no current psych disposition. Per chart review pt has been recommended for SNF placement. Pt to be seen by psych once medically clear. This CSW/ Disposition team will assist and follow with recommended disposition.      Andrew Banister, MSW, [facility] 06/20/2023 11:27 PM

## 2023-06-20 NOTE — TOC Initial Note (Signed)
 Transition of Care [facility] - Initial/Assessment Note    Patient Details  Name: Alejandro Porter MRN: [id number] Date of Birth: [birth year]  Transition of [facility]) CM/SW Contact:    Grandville Lax, LCSWA Phone Number: 06/20/2023, 1:24 PM  Clinical Narrative:                 CSW noted per chart review that pt will need to be seen by TTS. CSW spoke with pt at bedside due to PT recommendation for SNF. Pt states he lives alone and would be interested in this. CSW will not be able to complete the referral until pt has been seen and no longer followed by TTS. TOC to follow.   Expected Discharge Plan: Skilled Nursing Facility Barriers to Discharge: Continued Medical Work up   Patient Goals and CMS Choice Patient states their goals for this hospitalization and ongoing recovery are:: get better CMS Medicare.gov Compare Post Acute Care list provided to:: Patient Choice offered to / list presented to : Patient      Expected Discharge Plan and Services In-house Referral: Clinical Social Work Discharge Planning Services: CM Consult   Living arrangements for the past 2 months: Single Family Home                                      Prior Living Arrangements/Services Living arrangements for the past 2 months: Single Family Home Lives with:: Self Patient language and need for interpreter reviewed:: Yes Do you feel safe going back to the place where you live?: Yes      Need for Family Participation in Patient Care: Yes (Comment) Care giver support system in place?: Yes (comment) Current home services: DME Criminal Activity/Legal Involvement Pertinent to Current Situation/Hospitalization: No - Comment as needed  Activities of Daily Living   ADL Screening (condition at time of admission) Independently performs ADLs?: Yes (appropriate for developmental age) Is the patient deaf or have difficulty hearing?: Yes Does the patient have difficulty seeing, even when wearing  glasses/contacts?: No Does the patient have difficulty concentrating, remembering, or making decisions?: Yes  Permission Sought/Granted                  Emotional Assessment Appearance:: Appears stated age Attitude/Demeanor/Rapport: Engaged Affect (typically observed): Accepting Orientation: : Oriented to Self, Oriented to Place Alcohol  / Substance Use: Not Applicable Psych Involvement: No (comment)  Admission diagnosis:  Cellulitis of right lower extremity [L03.115] Patient Active Problem List   Diagnosis Date Noted   Failure to thrive in adult 06/20/2023   Hypoalbuminemia due to protein-calorie malnutrition (HCC) 06/20/2023   Hyperammonemia (HCC) 06/20/2023   Transaminitis 06/20/2023   AKI (acute kidney injury) (HCC) 06/20/2023   Prolonged QT interval 06/20/2023   History of cirrhosis 06/20/2023   Cellulitis of right lower extremity 06/19/2023   Elevated alkaline phosphatase level 04/11/2023   Obesity (BMI 30-39.9) 04/11/2023   Back pain 04/11/2023   Lower extremity weakness 04/10/2023   Black stool 11/23/2022   Lower extremity edema 11/23/2022   Nausea without vomiting 11/23/2022   Chest pain, pleuritic 11/23/2022   Iron  deficiency anemia due to chronic blood loss 09/21/2022   Liver lesion 09/06/2022   Shortness of breath 09/06/2022   Generalized weakness 09/06/2022   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   Rectal bleeding 03/15/2022   Dyspepsia 03/15/2022   Macrocytic anemia 02/01/2021   Umbilical hernia 09/24/2020  Right inguinal hernia 09/24/2020   OSA on CPAP 09/24/2020   Staph Auricularis SBP (spontaneous bacterial peritonitis) (HCC) 09/14/2020   Hyponatremia 09/14/2020   GERD (gastroesophageal reflux disease) 09/14/2020   Dysphagia 08/05/2020   Abdominal pain 08/05/2020   Acute metabolic encephalopathy 05/11/2020   Constipation 02/04/2020   Thrombocytopenia (HCC) 10/20/2019   Acute liver failure without hepatic coma     Other ascites    Colorectal polyps    Cecal polyp    Hepatic cirrhosis (HCC) 09/10/2019   Edema 09/10/2019   Portal hypertension (HCC) 09/10/2019   Liver cirrhosis (HCC) 09/10/2019   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   Anasarca    PCP:  Blondell Burgess, PA-C Pharmacy:   [facility] - [place], [place] - [address] [place] [address] Phone: [phone] Fax: [phone]     Social Drivers of Health (SDOH) Social History: SDOH Screenings   Food Insecurity: No Food Insecurity (06/20/2023)  Housing: Low Risk  (06/20/2023)  Transportation Needs: Unmet Transportation Needs (06/20/2023)  Utilities: Not At Risk (06/20/2023)  Depression (PHQ2-9): High Risk (06/28/2022)  Tobacco Use: Medium Risk (06/20/2023)   SDOH Interventions:     Readmission Risk Interventions    06/20/2023    1:20 PM  Readmission Risk Prevention Plan  Transportation Screening Complete  Home Care Screening Complete  Medication Review (RN CM) Complete

## 2023-06-20 NOTE — Progress Notes (Signed)
   06/20/23 1235  Spiritual Encounters  Type of Visit Initial  Care provided to: Pt not available  Referral source Other (comment) (Spiritual Consult)  Reason for visit Routine spiritual support  OnCall Visit No   Chaplain responded to a spiritual consult for prayer. The patient was receiving care at the time of my visit. I will leave the consult open for another chaplain to pick up tomorrow.   Clarence Croak [facility]  [phone]

## 2023-06-20 NOTE — Progress Notes (Signed)
 [facility] PROGRESS NOTE  Taja Gabel (DOB: 06-Dec-1975) LKG:[id number] PCP: Blondell Burgess, PA-C  Brief Narrative: Dareon Sorden is a 48 y.o. male with a history of NASH cirrhosis s/p TIPS who presented to the ED on 06/19/2023 with suicidal thoughts at home. History limited due to encephalopathy. He was tachycardic, afebrile, WBC 14.7k.   Subjective: Pt may be confabulating, contradicts himself at times answering that he has eaten but then he hasn't, that he doesn't have suicidal ideation, when specifying the last time, says not since 12:30 (encounter was ~1:00pm). Sitter at bedside, no family. He is more alert than previously. Complains of pain all ove, not specifically in abdomen, chest, legs, etc.   Objective: BP 105/63 (BP Location: Left Arm)   Pulse 96   Temp 97.7 F (36.5 C) (Oral)   Resp 14   Ht 5\' 8"  (1.727 m)   Wt 86.6 kg   SpO2 95%   BMI 29.03 kg/m   Gen: Chronically ill-appearing male in no acute distress, unkempt HEENT: Dry mucous membranes Pulm: Nonlabored, clear  CV: RRR, no MRG, LE edema noted, no JVD GI: Soft, protuberant with generalized tenderness, no rebound or guarding Neuro: Alert and incompletely oriented. No new focal deficits. Ext: Warm, no deformities. Skin: Erythema to lower legs bilaterally, not particularly warm without induration or fluctuance, not particularly tender. Diffuse icterus, no acute-appearing rashes, lesions or ulcers on visualized skin   Assessment & Plan: Principal Problem:   Acute metabolic encephalopathy Active Problems:   Thrombocytopenia (HCC)   GERD (gastroesophageal reflux disease)   Macrocytic anemia   Generalized weakness   Cellulitis of right lower extremity   Failure to thrive in adult   Hypoalbuminemia due to protein-calorie malnutrition (HCC)   Hyperammonemia (HCC)   Transaminitis   AKI (acute kidney injury) (HCC)   Prolonged QT interval   History of cirrhosis  Multifactorial metabolic  encephalopathy: Hyperammonemia likely contributing, though infection not completely ruled out. Note initial concern for RLE cellulitis which is being treated with abx. CT head showed no acute intracranial abnormality.  - Limit sedating medications - Continue high dose lactulose , no BM today. Add rifaximin  given persistent AMS and ammonia elevation.  - Given his history of cirrhosis and previous need for paracenteses now admitted with multifactorial encephalopathy and leukocytosis, I feel we need to r/o SBP with diagnostic para if fluid pocket exists, on ceftriaxone  regardless. He does not have significant tenderness on exam. CT abd/pelvis was limited by motion artifact only showed trace fluid, though exam suggests could have more fluid.   Suicidal ideation: Within the context of his altered mental status, he also clearly endorses thoughts of killing himself and mood disturbance seemingly related to social isolation with family members' deaths and brother moving out.  - Psychiatry is consulted. Suspect their evaluation would be highest yield in the coming 1-2 days once metabolic derangements are addressed.   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   Iron  deficiency, macrocytic anemia, thrombocytopenia: B12 and folic acid  are replete. Suspect this is due to cirrhosis.  - Remains hypercoagulable due to cirrhosis, so continuing lovenox  40mg  q24h.   GERD: Thickening of duodenum suggested by CT. Will keep this in mind without directed treatment at this time.  - Continue PPI  Falls at home, deconditioning: Appears to be failing to thrive. CT revealed multiple bilateral rib fractures in stages of healing.  - PT/OT/RD evaluations appreciated.   AKI: Improving with supportive measures, holding  diuretics.  - Continue IVF if po intake is poor.   Prolonged QT interval: at admission.  - Minimize provocative agents  RLE  cellulitis:  - Nonpurulent, continue CTX. Monitor leukocytosis.   Wynetta Heckle, MD [facility] [email or web address] 06/20/2023, 2:46 PM

## 2023-06-20 NOTE — Plan of Care (Signed)
 Request to IR for paracentesis - CT abd/pelvis earlier today reviewed and showed scant perihepatic ascites.  Limited abdominal US  of all 4 quadrants shows scant ascites in the RUQ only which is not amenable to safe paracentesis. No procedure performed.  Images available for review under imaging section of Epic.  Please place a new order for paracentesis if it is felt patient would benefit from repeat examination.  Nathan Bake, PA-C

## 2023-06-20 NOTE — Evaluation (Signed)
 Physical Therapy Evaluation Patient Details Name: Alejandro Porter MRN: [id number] DOB: 04-Jun-1976 Today's Date: 06/20/2023  History of Present Illness  Alejandro Porter is a 48 y.o. male with medical history significant of  cirrhosis of liver, iron  deficiency anemia, GERD, portal hypertension, prior TIPS procedure, CKD who presents to the emergency department due to suicidal thoughts at home.  Patient was unable to provide a history possibly due to confusion, history was obtained from ED physician and ED medical record.  Per report, patient had passed away a few months ago and has since been having worsening symptoms including about 30 pound weight loss over a few months, he has had multiple falls and has become more weaker. Patient ambulates with assistance, but since his brother and girlfriend moved out few months ago, he has had no help.  He complained of several months of chronic headaches and generalized bodyaches.   Clinical Impression  Patient very unsteady on feet during transfer to chair without AD, required use of RW for safety, demonstrates slow labored cadence requiring increased time and Min/mod assist due to decreased balance, safety and tolerated sitting up in chair with his family member present after therapy.  Patient will benefit from continued skilled physical therapy in hospital and recommended venue below to increase strength, balance, endurance for safe ADLs and gait.       If plan is discharge home, recommend the following: A lot of help with bathing/dressing/bathroom;A lot of help with walking and/or transfers;Help with stairs or ramp for entrance;Assistance with cooking/housework   Can travel by private vehicle   Yes    Equipment Recommendations None recommended by PT  Recommendations for Other Services       Functional Status Assessment Patient has had a recent decline in their functional status and demonstrates the ability to make significant improvements  in function in a reasonable and predictable amount of time.     Precautions / Restrictions Precautions Precautions: Fall Restrictions Weight Bearing Restrictions Per Provider Order: No      Mobility  Bed Mobility Overal bed mobility: Needs Assistance Bed Mobility: Supine to Sit     Supine to sit: Contact guard, Supervision     General bed mobility comments: increased time for completing supine to sitting    Transfers Overall transfer level: Needs assistance Equipment used: Rolling walker (2 wheels) Transfers: Sit to/from Stand, Bed to chair/wheelchair/BSC Sit to Stand: Min assist, Mod assist   Step pivot transfers: Min assist, Mod assist       General transfer comment: unsteady labored movement    Ambulation/Gait Ambulation/Gait assistance: Min assist, Mod assist Gait Distance (Feet): 30 Feet Assistive device: Rolling walker (2 wheels) Gait Pattern/deviations: Decreased step length - right, Decreased step length - left, Decreased stride length, Trunk flexed Gait velocity: decreased     General Gait Details: slow unsteady labored cadence without loss of balance, difficulty making turns, limited mostly due to fatigue  Stairs            Wheelchair Mobility     Tilt Bed    Modified Rankin (Stroke Patients Only)       Balance Overall balance assessment: Needs assistance Sitting-balance support: Feet supported, No upper extremity supported Sitting balance-Leahy Scale: Fair Sitting balance - Comments: fair to good seated at EOB   Standing balance support: Bilateral upper extremity supported, During functional activity, Reliant on assistive device for balance Standing balance-Leahy Scale: Fair Standing balance comment: using RW  Pertinent Vitals/Pain Pain Assessment Pain Assessment: Faces Faces Pain Scale: No hurt    Home Living Family/patient expects to be discharged to:: Private residence Living  Arrangements: Alone Available Help at Discharge: Family;Friend(s);Available PRN/intermittently Type of Home: House Home Access: Stairs to enter Entrance Stairs-Rails: Right;Left;Can reach both Entrance Stairs-Number of Steps: 3-4   Home Layout: One level Home Equipment: Grab bars - tub/shower Additional Comments: Per family report and chart review. Cousin present to help provide history.    Prior Function Prior Level of Function : Needs assist       Physical Assist : ADLs (physical);Mobility (physical) Mobility (physical): Bed mobility;Transfers;Gait;Stairs ADLs (physical): IADLs;Bathing Mobility Comments: household and short distanced community ambulation using RW PRN ADLs Comments: Pt reports PRN assist for bathing with IADL assist.     Extremity/Trunk Assessment   Upper Extremity Assessment Upper Extremity Assessment: Defer to OT evaluation    Lower Extremity Assessment Lower Extremity Assessment: Generalized weakness    Cervical / Trunk Assessment Cervical / Trunk Assessment: Normal  Communication   Communication Communication: Difficulty communicating thoughts/reduced clarity of speech Cueing Techniques: Verbal cues;Tactile cues  Cognition Arousal: Alert Behavior During Therapy: WFL for tasks assessed/performed Overall Cognitive Status: Within Functional Limits for tasks assessed                                          General Comments      Exercises     Assessment/Plan    PT Assessment Patient needs continued PT services  PT Problem List Decreased strength;Decreased activity tolerance;Decreased balance;Decreased mobility       PT Treatment Interventions DME instruction;Gait training;Functional mobility training;Therapeutic activities;Therapeutic exercise;Balance training;Stair training;Patient/family education    PT Goals (Current goals can be found in the Care Plan section)  Acute Rehab PT Goals Patient Stated Goal: return home  after rehab PT Goal Formulation: With patient/family Time For Goal Achievement: 07/04/23 Potential to Achieve Goals: Good    Frequency Min 3X/week     Co-evaluation PT/OT/SLP Co-Evaluation/Treatment: Yes Reason for Co-Treatment: To address functional/ADL transfers PT goals addressed during session: Mobility/safety with mobility;Balance;Proper use of DME OT goals addressed during session: ADL's and self-care       AM-PAC PT "6 Clicks" Mobility  Outcome Measure Help needed turning from your back to your side while in a flat bed without using bedrails?: A Little Help needed moving from lying on your back to sitting on the side of a flat bed without using bedrails?: A Little Help needed moving to and from a bed to a chair (including a wheelchair)?: A Lot Help needed standing up from a chair using your arms (e.g., wheelchair or bedside chair)?: A Lot Help needed to walk in hospital room?: A Lot Help needed climbing 3-5 steps with a railing? : A Lot 6 Click Score: 14    End of Session   Activity Tolerance: Patient tolerated treatment well;Patient limited by fatigue Patient left: in chair;with call bell/phone within reach;with nursing/sitter in room Nurse Communication: Mobility status PT Visit Diagnosis: Unsteadiness on feet (R26.81);Other abnormalities of gait and mobility (R26.89);Muscle weakness (generalized) (M62.81)    Time: 4098-1191 PT Time Calculation (min) (ACUTE ONLY): 24 min   Charges:   PT Evaluation $PT Eval Moderate Complexity: 1 Mod PT Treatments $Therapeutic Activity: 23-37 mins PT General Charges $$ ACUTE PT VISIT: 1 Visit         1:48 PM, 06/20/23 Royston Cornea  Candee Cha, MPT Physical Therapist with [facility] [phone] office [phone] mobile phone

## 2023-06-20 NOTE — Evaluation (Signed)
 Occupational Therapy Evaluation Patient Details Name: Alejandro Porter MRN: [id number] DOB: [birth year] Today's Date: 06/20/2023   History of Present Illness Alejandro Porter is a 48 y.o. male with medical history significant of  cirrhosis of liver, iron  deficiency anemia, GERD, portal hypertension, prior TIPS procedure, CKD who presents to the emergency department due to suicidal thoughts at home.  Patient was unable to provide a history possibly due to confusion, history was obtained from ED physician and ED medical record.  Per report, patient had passed away a few months ago and has since been having worsening symptoms including about 30 pound weight loss over a few months, he has had multiple falls and has become more weaker. Patient ambulates with assistance, but since his brother and girlfriend moved out few months ago, he has had no help.  He complained of several months of chronic headaches and generalized bodyaches. (per MD)   Clinical Impression   Pt agreeable to OT and PT co-evaluation. Pt reportedly lives alone and seems to have been struggling to care for himself based on report of pt's cousin. Pt was generally weak with min to mod A for transfers using RW. Pt required mod A for lower body dressing and likely required mod to max for other lower body ADL's as well. Pt left in the chair with family and staff present. Pt will benefit from continued OT in the hospital and recommended venue below to increase strength, balance, and endurance for safe ADL's.          If plan is discharge home, recommend the following: A lot of help with walking and/or transfers;A lot of help with bathing/dressing/bathroom;Assistance with cooking/housework;Assist for transportation;Help with stairs or ramp for entrance;Direct supervision/assist for medications management    Functional Status Assessment  Patient has had a recent decline in their functional status and demonstrates the ability to make  significant improvements in function in a reasonable and predictable amount of time.  Equipment Recommendations  None recommended by OT           Precautions / Restrictions Precautions Precautions: Fall Restrictions Weight Bearing Restrictions Per Provider Order: No      Mobility Bed Mobility Overal bed mobility: Needs Assistance Bed Mobility: Supine to Sit     Supine to sit: Contact guard, Supervision     General bed mobility comments: mild labored movement    Transfers Overall transfer level: Needs assistance Equipment used: Rolling walker (2 wheels) Transfers: Sit to/from Stand, Bed to chair/wheelchair/BSC Sit to Stand: Min assist, Mod assist     Step pivot transfers: Min assist, Mod assist     General transfer comment: labored; unsteady EOB to chair with RW      Balance Overall balance assessment: Needs assistance Sitting-balance support: No upper extremity supported, Feet supported Sitting balance-Leahy Scale: Fair Sitting balance - Comments: fair to good seated at EOB   Standing balance support: Bilateral upper extremity supported, During functional activity, Reliant on assistive device for balance Standing balance-Leahy Scale: Fair Standing balance comment: using RW                           ADL either performed or assessed with clinical judgement   ADL Overall ADL's : Needs assistance/impaired     Grooming: Set up;Sitting   Upper Body Bathing: Set up;Sitting   Lower Body Bathing: Moderate assistance;Maximal assistance;Sitting/lateral leans   Upper Body Dressing : Set up;Sitting   Lower Body Dressing: Moderate assistance;Maximal assistance;Sitting/lateral  leans Lower Body Dressing Details (indicate cue type and reason): Able to doff sock but required assist to don again while seated at EOB Toilet Transfer: Minimal assistance;Moderate assistance;Rolling walker (2 wheels);Ambulation;Stand-pivot Toilet Transfer Details (indicate cue  type and reason): Simulated via EOB to chair transfer and ambulation in the hall. Toileting- Clothing Manipulation and Hygiene: Minimal assistance;Sitting/lateral lean;Moderate assistance       Functional mobility during ADLs: Minimal assistance;Rolling walker (2 wheels)       Vision Baseline Vision/History: 1 Wears glasses Ability to See in Adequate Light: 1 Impaired Patient Visual Report: No change from baseline Vision Assessment?: No apparent visual deficits     Perception Perception: Not tested       Praxis Praxis: Not tested       Pertinent Vitals/Pain Pain Assessment Pain Assessment: Faces Faces Pain Scale: No hurt     Extremity/Trunk Assessment Upper Extremity Assessment Upper Extremity Assessment: Generalized weakness   Lower Extremity Assessment Lower Extremity Assessment: Defer to PT evaluation   Cervical / Trunk Assessment Cervical / Trunk Assessment: Normal   Communication Communication Communication: Other (comment) (mild slurring of words)   Cognition Arousal: Alert Behavior During Therapy: WFL for tasks assessed/performed Overall Cognitive Status: Within Functional Limits for tasks assessed                                                        Home Living Family/patient expects to be discharged to:: Private residence Living Arrangements: Alone Available Help at Discharge: Family;Friend(s);Available PRN/intermittently Type of Home: House Home Access: Stairs to enter [employer] of Steps: 3-4 Entrance Stairs-Rails: Right;Left;Can reach both Home Layout: One level     Bathroom Shower/Tub: Producer, television/film/video: Standard Bathroom Accessibility: No   Home Equipment: Grab bars - tub/shower   Additional Comments: Per family report and chart review. Cousin present to help provide history.      Prior Functioning/Environment Prior Level of Function : Needs assist       Physical Assist : ADLs  (physical)   ADLs (physical): IADLs;Bathing Mobility Comments: PRN use of RW ADLs Comments: Pt reports PRN assist for bathing with IADL assist.        OT Problem List: Decreased strength;Decreased activity tolerance;Impaired balance (sitting and/or standing)      OT Treatment/Interventions: Self-care/ADL training;Therapeutic exercise;Patient/family education;Therapeutic activities;DME and/or AE instruction    OT Goals(Current goals can be found in the care plan section) Acute Rehab OT Goals Patient Stated Goal: improve function; open to rehab OT Goal Formulation: With patient Time For Goal Achievement: 07/04/23 Potential to Achieve Goals: Good  OT Frequency: Min 2X/week    Co-evaluation PT/OT/SLP Co-Evaluation/Treatment: Yes Reason for Co-Treatment: To address functional/ADL transfers   OT goals addressed during session: ADL's and self-care                       End of Session Equipment Utilized During Treatment: Rolling walker (2 wheels)  Activity Tolerance: Patient tolerated treatment well Patient left: in chair;with call bell/phone within reach;with family/visitor present  OT Visit Diagnosis: Unsteadiness on feet (R26.81);Other abnormalities of gait and mobility (R26.89);Muscle weakness (generalized) (M62.81);History of falling (Z91.81)                Time: 1191-4782 OT Time Calculation (min): 18 min Charges:  OT General Charges $  OT Visit: 1 Visit OT Evaluation $OT Eval Low Complexity: 1 Low  Shameika Speelman OT, MOT  Thurnell Floss 06/20/2023, 10:18 AM

## 2023-06-20 NOTE — Plan of Care (Signed)
  Problem: Acute Rehab PT Goals(only PT should resolve) Goal: Pt Will Go Supine/Side To Sit Outcome: Progressing Flowsheets (Taken 06/20/2023 1349) Pt will go Supine/Side to Sit:  with contact guard assist  with minimal assist Goal: Patient Will Transfer Sit To/From Stand Outcome: Progressing Flowsheets (Taken 06/20/2023 1349) Patient will transfer sit to/from stand:  with contact guard assist  with minimal assist Goal: Pt Will Transfer Bed To Chair/Chair To Bed Outcome: Progressing Flowsheets (Taken 06/20/2023 1349) Pt will Transfer Bed to Chair/Chair to Bed:  with contact guard assist  with min assist Goal: Pt Will Ambulate Outcome: Progressing Flowsheets (Taken 06/20/2023 1349) Pt will Ambulate:  50 feet  with contact guard assist  with rolling walker  with minimal assist   1:49 PM, 06/20/23 Alejandro Porter, MPT Physical Therapist with [facility] [phone] office [phone] mobile phone

## 2023-06-20 NOTE — Progress Notes (Addendum)
 Pt arrived to unit oriented to self and location disoriented to time and situation. Pt asked SI and HI assessment question 3 times during assessment and pt denies SI/HI and states he feels safe at this time. Pt fed. Given a bath, and changed clothes. Pt c/o of pain and prn pain meds given. Please see MAR.   Pt is restless, moaning in pain and seems more confused then arrival.

## 2023-06-21 DIAGNOSIS — G9341 Metabolic encephalopathy: Secondary | ICD-10-CM | POA: Diagnosis not present

## 2023-06-21 LAB — CBC
HCT: 34.5 % — ABNORMAL LOW (ref 39.0–52.0)
Hemoglobin: 11.8 g/dL — ABNORMAL LOW (ref 13.0–17.0)
MCH: 35.4 pg — ABNORMAL HIGH (ref 26.0–34.0)
MCHC: 34.2 g/dL (ref 30.0–36.0)
MCV: 103.6 fL — ABNORMAL HIGH (ref 80.0–100.0)
Platelets: 82 10*3/uL — ABNORMAL LOW (ref 150–400)
RBC: 3.33 MIL/uL — ABNORMAL LOW (ref 4.22–5.81)
RDW: 17.6 % — ABNORMAL HIGH (ref 11.5–15.5)
WBC: 8.8 10*3/uL (ref 4.0–10.5)
nRBC: 0 % (ref 0.0–0.2)

## 2023-06-21 LAB — URINALYSIS, W/ REFLEX TO CULTURE (INFECTION SUSPECTED)
Glucose, UA: NEGATIVE mg/dL
Ketones, ur: NEGATIVE mg/dL
Nitrite: NEGATIVE
Protein, ur: 30 mg/dL — AB
Specific Gravity, Urine: 1.034 — ABNORMAL HIGH (ref 1.005–1.030)
pH: 5 (ref 5.0–8.0)

## 2023-06-21 LAB — RAPID URINE DRUG SCREEN, HOSP PERFORMED
Amphetamines: NOT DETECTED
Barbiturates: NOT DETECTED
Benzodiazepines: NOT DETECTED
Cocaine: NOT DETECTED
Opiates: NOT DETECTED
Tetrahydrocannabinol: NOT DETECTED

## 2023-06-21 LAB — COMPREHENSIVE METABOLIC PANEL
ALT: 40 U/L (ref 0–44)
AST: 84 U/L — ABNORMAL HIGH (ref 15–41)
Albumin: 2.3 g/dL — ABNORMAL LOW (ref 3.5–5.0)
Alkaline Phosphatase: 163 U/L — ABNORMAL HIGH (ref 38–126)
Anion gap: 6 (ref 5–15)
BUN: 29 mg/dL — ABNORMAL HIGH (ref 6–20)
CO2: 22 mmol/L (ref 22–32)
Calcium: 9 mg/dL (ref 8.9–10.3)
Chloride: 102 mmol/L (ref 98–111)
Creatinine, Ser: 0.84 mg/dL (ref 0.61–1.24)
GFR, Estimated: >60 mL/min (ref >60)
Glucose, Bld: 133 mg/dL — ABNORMAL HIGH (ref 70–99)
Potassium: 3.8 mmol/L (ref 3.5–5.1)
Sodium: 130 mmol/L — ABNORMAL LOW (ref 135–145)
Total Bilirubin: 5.1 mg/dL — ABNORMAL HIGH (ref 0.0–1.2)
Total Protein: 5.5 g/dL — ABNORMAL LOW (ref 6.5–8.1)

## 2023-06-21 MED ORDER — ENSURE ENLIVE PO LIQD
237.0000 mL | Freq: Three times a day (TID) | ORAL | Status: DC
  Administered 2023-06-21 – 2023-06-25 (×8): 237 mL via ORAL

## 2023-06-21 MED ORDER — SERTRALINE HCL 50 MG PO TABS
25.0000 mg | ORAL_TABLET | Freq: Every day | ORAL | Status: DC
  Administered 2023-06-21 – 2023-06-22 (×2): 25 mg via ORAL
  Filled 2023-06-21 (×2): qty 1

## 2023-06-21 MED ORDER — NYSTATIN 100000 UNIT/ML MT SUSP
5.0000 mL | Freq: Four times a day (QID) | OROMUCOSAL | Status: DC
  Administered 2023-06-21 – 2023-06-25 (×17): 500000 [IU] via ORAL
  Filled 2023-06-21 (×20): qty 5

## 2023-06-21 MED ORDER — SERTRALINE HCL 20 MG/ML PO CONC
25.0000 mg | Freq: Every day | ORAL | Status: DC
  Filled 2023-06-21 (×2): qty 1.25

## 2023-06-21 MED ORDER — HYDROXYZINE HCL 25 MG PO TABS
25.0000 mg | ORAL_TABLET | Freq: Three times a day (TID) | ORAL | Status: DC | PRN
Start: 2023-06-21 — End: 2023-06-26
  Administered 2023-06-22 – 2023-06-24 (×5): 25 mg via ORAL
  Filled 2023-06-21 (×6): qty 1

## 2023-06-21 MED ORDER — CEFADROXIL 500 MG PO CAPS
500.0000 mg | ORAL_CAPSULE | Freq: Two times a day (BID) | ORAL | Status: AC
  Administered 2023-06-22 – 2023-06-24 (×6): 500 mg via ORAL
  Filled 2023-06-21 (×6): qty 1

## 2023-06-21 MED ORDER — ADULT MULTIVITAMIN W/MINERALS CH
1.0000 | ORAL_TABLET | Freq: Every day | ORAL | Status: DC
  Administered 2023-06-21 – 2023-06-25 (×5): 1 via ORAL
  Filled 2023-06-21 (×7): qty 1

## 2023-06-21 MED ORDER — ACETAMINOPHEN 500 MG PO TABS
500.0000 mg | ORAL_TABLET | Freq: Four times a day (QID) | ORAL | Status: DC | PRN
  Administered 2023-06-21 – 2023-06-25 (×3): 500 mg via ORAL
  Filled 2023-06-21 (×3): qty 1

## 2023-06-21 NOTE — Progress Notes (Addendum)
Initial Nutrition Assessment  DOCUMENTATION CODES:   Severe malnutrition in context of chronic illness  INTERVENTION:   Ensure Plus High Protein po TID, each supplement provides 350 kcal and 20 grams of protein. Magic cup BID with meals, each supplement provides 290 kcal and 9 grams of protein. Carnation Breakfast Essentials BID, each packet mixed with 8 ounces of whole milk provides 13 grams of protein and 290 calories. MVI with minerals daily. Recommend: Monitor magnesium, potassium, and phosphorus levels, MD to replete as needed, as pt is at risk for refeeding syndrome given severe PCM with minimal intake and severe weight loss. Add Thiamine 100 mg daily for 7 days.  NUTRITION DIAGNOSIS:   Severe Malnutrition related to chronic illness (liver cirrhosis) as evidenced by severe muscle depletion, percent weight loss (21% weight loss x 6 months).  GOAL:   Patient will meet greater than or equal to 90% of their needs  MONITOR:   PO intake, Supplement acceptance  REASON FOR ASSESSMENT:   Consult Assessment of nutrition requirement/status  ASSESSMENT:   48 yo male admitted with acute metabolic encephalopathy, RLE cellulitis, FTT. PMH includes HTN, neuropathy, liver cirrhosis, CKD, GERD, depression, esophageal banding 2021.  Patient reports poor intake recently d/t poor appetite r/t liver cirrhosis. He was eating small amounts and drinking water and juice PTA. Since admission, appetite remains poor. He has been able to tolerate beverages and soft foods, like pudding, jello, and ice cream, but does not have an appetite for hot meals. He likes the Ensure supplements. Also wants to try magic cups and Carnation breakfast essentials supplements with meals.  Currently on a heart healthy diet. Meal intakes 25-100%.  Labs reviewed. Na 130, ammonia 71 (1/14)  Medications reviewed and include lactulose, protonix, IV antibiotics.  Weight history reviewed. Patient with 21% weight loss  within the past 6 months. Patient meets criteria for severe malnutrition, given severe depletion of muscle mass with severe weight loss.  NUTRITION - FOCUSED PHYSICAL EXAM:  Flowsheet Row Most Recent Value  Orbital Region Severe depletion  Upper Arm Region Mild depletion  Thoracic and Lumbar Region Mild depletion  Buccal Region Severe depletion  Temple Region Severe depletion  Clavicle Bone Region Severe depletion  Clavicle and Acromion Bone Region Severe depletion  Scapular Bone Region Severe depletion  Dorsal Hand Mild depletion  Patellar Region No depletion  Anterior Thigh Region No depletion  Posterior Calf Region No depletion  Edema (RD Assessment) Moderate  Hair Reviewed  Eyes Reviewed  Mouth Reviewed  Skin Reviewed  Nails Reviewed       Diet Order:   Diet Order             Diet Heart Room service appropriate? Yes; Fluid consistency: Thin  Diet effective now                   EDUCATION NEEDS:   Not appropriate for education at this time  Skin:  Skin Assessment: Reviewed RN Assessment  Last BM:  1/14  Height:   Ht Readings from Last 1 Encounters:  06/20/23 5\' 8"  (1.727 m)    Weight:   Wt Readings from Last 1 Encounters:  06/20/23 86.6 kg    Ideal Body Weight:  70 kg  BMI:  Body mass index is 29.03 kg/m.  Estimated Nutritional Needs:   Kcal:  2100-2300  Protein:  105-120 gm  Fluid:  2.1-2.3 L   Gabriel Rainwater RD, LDN, CNSC Contact Inpatient RD using Secure Chat. If unavailable, use group chat "  RD Inpatient" via Secure Chat in EP[employer].

## 2023-06-21 NOTE — Plan of Care (Signed)

## 2023-06-21 NOTE — Progress Notes (Signed)
[facility] PROGRESS NOTE  Alejandro Porter (DOB: 1975/08/09) EAV:[id number] PCP: Reather Converse, PA-C  Brief Narrative: Alejandro Porter is a 48 y.o. male with a history of NASH cirrhosis s/p TIPS who presented to the ED on 06/19/2023 with suicidal thoughts at home. History limited due to encephalopathy. He was tachycardic, afebrile, WBC 14.7k.   Subjective: Pt more alert and aware than yesterday, reports chronic left sided headache for which he usually takes tylenol at home. He's unclear on many details of his presentation still. When asked, says his throat is "kind of" sore." Able to take po. Had urinary retention last night, 425cc from I/O cath.   Objective: BP 115/75 (BP Location: Right Arm)   Pulse 94   Temp 98 F (36.7 C) (Oral)   Resp 18   Ht 5\' 8"  (1.727 m)   Wt 86.6 kg   SpO2 97%   BMI 29.03 kg/m   Gen: No distress, chronically ill-appearing Pulm: Clear, nonlabored  CV: RRR, no MRG, stable LE edema GI: Soft, protuberant without tenderness or guarding, +BS.  Neuro: Alert, slow to respond at times, waxing/waning course of orientation per sitter, currently guesses it is 1997 and correctly assumes Joni Fears is president in that case. He is surprised to hear it's 2025. Knows he's in a hospital and thinks it's in [place]. Does not volunteer what state, but does correctly identify [place]. No new focal deficits. No asterixis, though mild tremor.  Ext: Warm, no deformities Skin: Stable erythema LE's. No fluctuance.   Assessment & Plan: Principal Problem:   Acute metabolic encephalopathy Active Problems:   Thrombocytopenia (HCC)   GERD (gastroesophageal reflux disease)   Macrocytic anemia   Generalized weakness   Cellulitis of right lower extremity   Failure to thrive in adult   Hypoalbuminemia due to protein-calorie malnutrition (HCC)   Hyperammonemia (HCC)   Transaminitis   AKI (acute kidney injury) (HCC)   Prolonged QT interval   History of  cirrhosis  Multifactorial metabolic encephalopathy: Hyperammonemia likely contributing, though infection not completely ruled out. Note initial concern for RLE cellulitis which is being treated with abx. CT head showed no acute intracranial abnormality.  - Limit sedating medications, holding narcotic medications.  - Continue high dose lactulose, LBM 1/14. Added rifaximin given persistent AMS and ammonia elevation.  - Insufficient fluid for diagnostic paracentesis 1/15.   Suicidal ideation: Within the context of his altered mental status, he also clearly endorses thoughts of killing himself and mood disturbance seemingly related to social isolation with family members' deaths and brother moving out.  - Psychiatry is consulted. Suspect their evaluation would be highest yield in the coming 24 hours once metabolic derangements are addressed.   NASH cirrhosis: MELD score is 28 (19.6% 64-month mortality) - Appears dehydrated, so holding lasix/spironolactone.   Acute urinary retention: Prostate grossly unremarkable on CT which also showed normal right kidney, nonobstructing 2mm stones in left kidney without hydronephrosis. Bladder wnl.  - Check UA, cath prn. If continues without etiology, would start tamsulosin.   Iron deficiency, macrocytic anemia, thrombocytopenia: B12 and folic acid are replete. Suspect this is due to cirrhosis.  - Remains hypercoagulable due to cirrhosis, so continuing lovenox 40mg  q24h.   GERD: Thickening of duodenum suggested by CT. Will keep this in mind without directed treatment at this time.  - Continue PPI  Falls at home, deconditioning: Appears to be failing to thrive. CT revealed multiple bilateral rib fractures in stages of healing.  - PT/OT/RD evaluations appreciated. Anticipate need for rehabilitation,  will attempt to maximize this while admitted in the event that he requires psychiatric hospitalization.   AKI: Improving with supportive measures, holding diuretics.  CrCl now wnl. - Continue holding diuretics, stop IVF.   Prolonged QT interval: at admission.  - Minimize provocative agents  RLE cellulitis:  - Nonpurulent, continue CTX. Leukocytosis has resolved.   Tyrone Nine, MD [facility] [email or web address] 06/21/2023, 11:10 AM

## 2023-06-21 NOTE — Progress Notes (Addendum)
Per psych provider Cecilie Lowers, FNP pt has been psych cleared.   -This CSW added [facility] to AVS as a resource.  This CSW will now remove pt from the TTS/ BH shift report. TOC to assist with discharge needs if identified.   Maryjean Ka, MSW, LCSWA 06/21/2023 7:20 PM

## 2023-06-21 NOTE — Consult Note (Signed)
[facility] Psychiatric Consult Follow-up  Patient Name: .Alejandro Porter  MRN: [id number]  DOB: [birth year]  Consult Order details: Zoloft  tablet 20 mg po daily for depression, Hydroxyzine tablet 25 mg po tid prn for anxiety Orders (From admission, onward) none     Start     Ordered   06/19/23 1829  CONSULT TO CALL ACT TEAM       Ordering Provider: Frankey Shown, DO  Provider:  (Not yet assigned)  Question:  Reason for Consult?  Answer:  SI   06/19/23 1829             Mode of Visit: Tele-visit Location of Provider [facility]    Psychiatry Consult Evaluation  Service Date: June 21, 2023 LOS:  LOS: 2 days  Chief Complaint Depression and SI  Primary Psychiatric Diagnoses  Depression 2.  Suicidal Ideation 3.  N/A  Assessment  Alejandro Porter is a 48 y.o. male admitted: Presented to the ED on 06/19/2023  6:32 PM for depression and suicidal thought. He carries the psychiatric diagnoses of anxiety and depression and has a past medical history of Thrombocytopenia, Acute metabolic encephalopathy, GERD, generalized weakness, and cellulitis to right leg.   His current presentation of acute metabolic encephalopathy is most consistent with his generalized weakness. He does not meets criteria for psychiatric inpatient admission based on denial of SI/HI/AVH.  Current outpatient psychotropic medications include none and historically he is not taking any psychotropic  medications. On initial examination, patient denies SI/HI/AVH. Please see plan below for detailed recommendations.   Diagnoses:  Active Hospital problems: Principal Problem:   Acute metabolic encephalopathy Active Problems:   Thrombocytopenia (HCC)   GERD (gastroesophageal reflux disease)   Macrocytic anemia   Generalized weakness   Cellulitis of right lower extremity   Failure to thrive in adult   Hypoalbuminemia due to protein-calorie malnutrition (HCC)   Hyperammonemia (HCC)   Transaminitis   AKI  (acute kidney injury) (HCC)   Prolonged QT interval   History of cirrhosis    Plan   ## Psychiatric Medication Recommendations: Outpatient counseling / therapy services --Initiate Zoloft 20 mg po daily for depression and anxiety --Initiate Hydroxyzine 25 mg po tid prn for anxiety  ## Medical Decision Making Capacity: See MD treatment plan  ## Further Work-up:  -- See MD treatment plan TSH, B12, folate, EKG, While pt on Qtc prolonging medications, please monitor & replete K+ to 4 and Mg2+ to 2, TOC consult for substance abuse resources, U/A, or UDS -- most recent EKG on 06/19/23 had QtC of 525 -- Pertinent labwork reviewed earlier this admission includes: Vitamin B-12 2,069, CBC all abnormal values, EKG abnormality  ## Disposition:-- Plan Post Discharge/Psychiatric Care Follow-up resources Psychiatric outpatient services.  ## Behavioral / Environmental: - No specific recommendations at this time.     ## Safety and Observation Level:  - Based on my clinical evaluation, I estimate the patient to be at no risk of self harm in the current setting. - At this time, we recommend  routine. This decision is based on my review of the chart including patient's history and current presentation, interview of the patient, mental status examination, and consideration of suicide risk including evaluating suicidal ideation, plan, intent, suicidal or self-harm behaviors, risk factors, and protective factors. This judgment is based on our ability to directly address suicide risk, implement suicide prevention strategies, and develop a safety plan while the patient is in the clinical setting. Please contact our team if there  is a concern that risk level has changed.  CSSR Risk Category:C-SSRS RISK CATEGORY: High Risk  Suicide Risk Assessment: Patient has following modifiable risk factors for suicide: social isolation, which we are addressing by out patient resources referral. Patient has following  non-modifiable or demographic risk factors for suicide: male gender Patient has the following protective factors against suicide: Cultural, spiritual, or religious beliefs that discourage suicide, Frustration tolerance, no history of suicide attempts, and no history of NSSIB  Thank you for this consult request. Recommendations have been communicated to the primary team.  We will start patient on Zoloft 20 mg po daily for depression and hydroxyzine 25 mg po tid prn for anxiety at this time.   Cecilie Lowers, FNP       History of Present Illness  Relevant Aspects of [facility] Course:  Admitted on 06/19/2023 for Medical comorbidities and complaint of depression and anxiety.  Patient Report:  Being lonely and no close family member  Psych ROS:  Depression: loneliness Anxiety:  none Mania (lifetime and current): none Psychosis: (lifetime and current): none  Collateral information:  Contacted none at none  Review of Systems  Constitutional:  Negative for chills and fever.  HENT:  Negative for sore throat.   Eyes:  Negative for blurred vision.  Respiratory:  Negative for cough, sputum production, shortness of breath and wheezing.   Cardiovascular:  Negative for chest pain and palpitations.  Gastrointestinal:  Negative for abdominal pain, constipation, diarrhea, heartburn, nausea and vomiting.  Genitourinary:  Negative for dysuria, frequency and urgency.  Skin:  Negative for itching and rash.  Neurological:  Negative for dizziness, tingling and headaches.  Psychiatric/Behavioral:  Negative for depression, hallucinations, substance abuse and suicidal ideas. The patient is nervous/anxious.      Psychiatric and Social History  Psychiatric History:  Information collected from chart review  Prev Dx/Sx: sadness Current Psych Provider: sadness Home Meds (current): medication for medical problems Previous Med Trials: no Therapy: Will need Referral  Prior Psych Hospitalization:  none  Prior Self Harm: none Prior Violence:  none  Family Psych History: Brother overdosed and died Family Hx suicide: Brother overdosed and died  Social History:  Developmental Hx:  no abnormality Educational Hx: High school Occupational Hx: unemployed Armed forces operational officer Hx: none Living Situation: Lives by himself Spiritual Hx: None Access to weapons/lethal means: Patient denies   Substance History Alcohol: yes  Type of alcohol liquor Last Drink pt unsure Number of drinks per day pt unsure History of alcohol withdrawal seizures denies History of DT's denies Tobacco: denies Illicit drugs: denies Prescription drug abuse: Denies Rehab hx: denies  Exam Findings  Physical Exam: None performed. Seen via telepsych  Vital Signs:  Temp:  [97.6 F (36.4 C)-98 F (36.7 C)] 97.6 F (36.4 C) (01/16 1333) Pulse Rate:  [94-104] 104 (01/16 1333) Resp:  [15-20] 20 (01/16 1333) BP: (111-123)/(73-81) 112/73 (01/16 1333) SpO2:  [93 %-99 %] 99 % (01/16 1333) Blood pressure 112/73, pulse (!) 104, temperature 97.6 F (36.4 C), temperature source Oral, resp. rate 20, height 5\' 8"  (1.727 m), weight 86.6 kg, SpO2 99%. Body mass index is 29.03 kg/m.  Physical Exam: none  Mental Status Exam: General Appearance: Disheveled  Orientation:  Full (Time, Place, and Person)  Memory:  Recent;   Fair  Concentration:  Concentration: Fair and Attention Span: Fair  Recall:  Fair  Attention  Fair  Eye Contact:  Fair  Speech:  Slow  Language:  Fair  Volume:  Normal  Mood: less  depressed  Affect:  Restricted  Thought Process:  Linear  Thought Content:  WDL  Suicidal Thoughts:  No  Homicidal Thoughts:  No  Judgement:  Fair  Insight:  Fair  Psychomotor Activity:  Normal  Akathisia:  No  Fund of Knowledge:  Fair      Assets:  Communication Skills Desire for Improvement Housing Resilience  Cognition:  WNL  ADL's:  Impaired  AIMS (if indicated):        Other History   These have been pulled in  through the EMR, reviewed, and updated if appropriate.  Family History:  The patient's family history includes Brain cancer in his mother; Diabetes in his father and mother; Pulmonary embolism in his brother.  Medical History: Past Medical History:  Diagnosis Date   Anxiety 08/2022   Cirrhosis (HCC)    CKD (chronic kidney disease) stage 2, GFR 60-89 ml/min 05/24/2022   stage 3a   Depression 06/2022   GERD (gastroesophageal reflux disease)    H/O colonoscopy    H/O endoscopy    Headache    Hypertension    Liver cirrhosis (HCC)    Neuropathy    OSA (obstructive sleep apnea)    not currently using CPAP   S/P abdominal paracentesis    Surgical History: Past Surgical History:  Procedure Laterality Date   BIOPSY  09/12/2019   Procedure: BIOPSY;  Surgeon: Corbin Ade, MD;  [facility];  Service: Endoscopy;;   COLONOSCOPY N/A 09/18/2019   Dr. Darrick Penna: 12 colon polyps removed, multiple simple adenomas and some inflammatory polyps, diverticulosis, external and internal hemorrhoids.  Next colonoscopy in 3 years.   COLONOSCOPY WITH PROPOFOL N/A 03/24/2022   Procedure: COLONOSCOPY WITH PROPOFOL;  Surgeon: Lanelle Bal, DO;  [facility];  Service: Endoscopy;  Laterality: N/A;  1:30pm, asa 3   ESOPHAGEAL BANDING N/A 09/12/2019   Procedure: ESOPHAGEAL BANDING;  Surgeon: Corbin Ade, MD;  [facility];  Service: Endoscopy;  Laterality: N/A;   ESOPHAGOGASTRODUODENOSCOPY N/A 09/12/2019   Dr. Jena Gauss: Mild erosive reflux esophagitis.  Schatzki ring status post dilation.  Small grade 1/grade 2 esophageal varices, portal hypertensive gastropathy, hyperplastic gastric polyp, gastric erosions with chronic inactive gastritis on biopsies, no H. pylori.   ESOPHAGOGASTRODUODENOSCOPY (EGD) WITH PROPOFOL N/A 12/16/2020   two short columns of no more than Grade 2 esophageal varices, appearing innocent. Grade 1 varices not apparent today. Multiple large posterior  body and antral hyperplastic, hemorrhagic appearing polyps, larges approximately 2.5 cm pedunculated. Portal gastropathy. Normal duodenum. Polypectomy with clips placed. EGD in 18 months.   ESOPHAGOGASTRODUODENOSCOPY (EGD) WITH PROPOFOL N/A 03/24/2022   Procedure: ESOPHAGOGASTRODUODENOSCOPY (EGD) WITH PROPOFOL;  Surgeon: Lanelle Bal, DO;  [facility];  Service: Endoscopy;  Laterality: N/A;   IR ANGIOGRAM SELECTIVE EACH ADDITIONAL VESSEL  09/04/2022   IR EMBO VENOUS NOT HEMORR HEMANG  INC GUIDE ROADMAPPING  09/04/2022   IR INTRAVASCULAR ULTRASOUND NON CORONARY  09/04/2022   IR PARACENTESIS  09/04/2022   IR RADIOLOGIST EVAL & MGMT  07/19/2022   IR RADIOLOGIST EVAL & MGMT  08/14/2022   IR RADIOLOGIST EVAL & MGMT  09/29/2022   IR RADIOLOGIST EVAL & MGMT  10/31/2022   IR RADIOLOGIST EVAL & MGMT  11/20/2022   IR RADIOLOGIST EVAL & MGMT  02/21/2023   IR TIPS  09/04/2022   IR US GUIDE VASC ACCESS RIGHT  09/04/2022   POLYPECTOMY  09/18/2019   Procedure: POLYPECTOMY;  Surgeon: West Bali, MD;  [facility];  Service: Endoscopy;;   POLYPECTOMY  12/16/2020   Procedure: POLYPECTOMY;  Surgeon: Corbin Ade, MD;  [facility];  Service: Endoscopy;;  gastric   POLYPECTOMY  03/24/2022   Procedure: POLYPECTOMY;  Surgeon: Lanelle Bal, DO;  [facility];  Service: Endoscopy;;   RADIOLOGY WITH ANESTHESIA N/A 09/04/2022   Procedure: TIPS;  Surgeon: Bennie Dallas, MD;  Location: [facility];  Service: Radiology;  Laterality: N/A;   Medications:   Current Facility-Administered Medications:    acetaminophen (TYLENOL) tablet 500 mg, 500 mg, Oral, Q6H PRN, Tyrone Nine, MD, 500 mg at 06/21/23 1022   [START ON 06/22/2023] cefadroxil (DURICEF) capsule 500 mg, 500 mg, Oral, BID, Tyrone Nine, MD   enoxaparin (LOVENOX) injection 40 mg, 40 mg, Subcutaneous, Q24H, Adefeso, Oladapo, DO, 40 mg at 06/21/23 0841   feeding supplement (ENSURE ENLIVE / ENSURE PLUS) liquid 237 mL, 237  mL, Oral, TID BM, Tyrone Nine, MD, 237 mL at 06/21/23 1255   lactulose (CHRONULAC) 10 GM/15ML solution 40 g, 40 g, Oral, TID, Adefeso, Oladapo, DO, 40 g at 06/21/23 0841   multivitamin with minerals tablet 1 tablet, 1 tablet, Oral, Daily, Tyrone Nine, MD, 1 tablet at 06/21/23 1255   nystatin (MYCOSTATIN) 100000 UNIT/ML suspension 500,000 Units, 5 mL, Oral, QID, Tyrone Nine, MD, 500,000 Units at 06/21/23 1255   ondansetron (ZOFRAN) tablet 4 mg, 4 mg, Oral, Q6H PRN **OR** ondansetron (ZOFRAN) injection 4 mg, 4 mg, Intravenous, Q6H PRN, Adefeso, Oladapo, DO   pantoprazole (PROTONIX) EC tablet 40 mg, 40 mg, Oral, BID AC, Adefeso, Oladapo, DO, 40 mg at 06/21/23 0841   rifaximin (XIFAXAN) tablet 550 mg, 550 mg, Oral, BID, Tyrone Nine, MD, 550 mg at 06/21/23 0841  Allergies: Allergies  Allergen Reactions   Chlorthalidone     Severe headaches    Metoprolol     Severe headaches     Cecilie Lowers, FNP

## 2023-06-21 NOTE — Plan of Care (Signed)

## 2023-06-21 NOTE — Progress Notes (Signed)
Mobility Specialist Progress Note:    06/21/23 1354  Mobility  Activity Ambulated with assistance in hallway  Level of Assistance Minimal assist, patient does 75% or more  Assistive Device Front wheel walker  Distance Ambulated (ft) 40 ft  Range of Motion/Exercises Active;All extremities  Activity Response Tolerated well  Mobility Referral Yes  Mobility visit 1 Mobility  Mobility Specialist Start Time (ACUTE ONLY) 1345  Mobility Specialist Stop Time (ACUTE ONLY) 1355  Mobility Specialist Time Calculation (min) (ACUTE ONLY) 10 min   Pt received in bathroom, NT in room. Agreeable to mobility, required MinA to ambulate with RW. Tolerated well, c/o fatigue. Returned to room, left pt in chair with NT. All needs met.   Lawerance Bach Mobility Specialist Please contact via Special educational needs teacher or  Rehab office at [phone]

## 2023-06-22 ENCOUNTER — Inpatient Hospital Stay (HOSPITAL_COMMUNITY): Payer: Medicaid Other

## 2023-06-22 ENCOUNTER — Encounter (HOSPITAL_COMMUNITY): Payer: Self-pay | Admitting: Internal Medicine

## 2023-06-22 ENCOUNTER — Other Ambulatory Visit (HOSPITAL_COMMUNITY): Payer: Self-pay | Admitting: Interventional Radiology

## 2023-06-22 DIAGNOSIS — G9341 Metabolic encephalopathy: Secondary | ICD-10-CM | POA: Diagnosis not present

## 2023-06-22 DIAGNOSIS — E43 Unspecified severe protein-calorie malnutrition: Secondary | ICD-10-CM | POA: Insufficient documentation

## 2023-06-22 LAB — BASIC METABOLIC PANEL
Anion gap: 5 (ref 5–15)
Anion gap: 5 (ref 5–15)
Anion gap: 9 (ref 5–15)
BUN: 26 mg/dL — ABNORMAL HIGH (ref 6–20)
BUN: 25 mg/dL — ABNORMAL HIGH (ref 6–20)
BUN: 23 mg/dL — ABNORMAL HIGH (ref 6–20)
CO2: 21 mmol/L — ABNORMAL LOW (ref 22–32)
CO2: 19 mmol/L — ABNORMAL LOW (ref 22–32)
CO2: 19 mmol/L — ABNORMAL LOW (ref 22–32)
Calcium: 9 mg/dL (ref 8.9–10.3)
Calcium: 8.9 mg/dL (ref 8.9–10.3)
Calcium: 8.9 mg/dL (ref 8.9–10.3)
Chloride: 98 mmol/L (ref 98–111)
Chloride: 98 mmol/L (ref 98–111)
Chloride: 95 mmol/L — ABNORMAL LOW (ref 98–111)
Creatinine, Ser: 0.66 mg/dL (ref 0.61–1.24)
Creatinine, Ser: 0.73 mg/dL (ref 0.61–1.24)
Creatinine, Ser: 0.76 mg/dL (ref 0.61–1.24)
GFR, Estimated: >60 mL/min (ref >60)
GFR, Estimated: >60 mL/min (ref >60)
GFR, Estimated: >60 mL/min (ref >60)
Glucose, Bld: 138 mg/dL — ABNORMAL HIGH (ref 70–99)
Glucose, Bld: 135 mg/dL — ABNORMAL HIGH (ref 70–99)
Glucose, Bld: 192 mg/dL — ABNORMAL HIGH (ref 70–99)
Potassium: 3.7 mmol/L (ref 3.5–5.1)
Potassium: 3.8 mmol/L (ref 3.5–5.1)
Potassium: 3.7 mmol/L (ref 3.5–5.1)
Sodium: 124 mmol/L — ABNORMAL LOW (ref 135–145)
Sodium: 122 mmol/L — ABNORMAL LOW (ref 135–145)
Sodium: 123 mmol/L — ABNORMAL LOW (ref 135–145)

## 2023-06-22 LAB — URINE CULTURE: Culture: NO GROWTH

## 2023-06-22 LAB — SODIUM, URINE, RANDOM: Sodium, Ur: <10 mmol/L

## 2023-06-22 LAB — OSMOLALITY: Osmolality: 277 mosm/kg (ref 275–295)

## 2023-06-22 LAB — BODY FLUID CELL COUNT WITH DIFFERENTIAL
Eos, Fluid: 0 %
Lymphs, Fluid: 16 %
Monocyte-Macrophage-Serous Fluid: 50 % (ref 50–90)
Neutrophil Count, Fluid: 34 % — ABNORMAL HIGH (ref 0–25)
Total Nucleated Cell Count, Fluid: 212 uL (ref 0–1000)

## 2023-06-22 LAB — CBC
HCT: 35 % — ABNORMAL LOW (ref 39.0–52.0)
Hemoglobin: 12.1 g/dL — ABNORMAL LOW (ref 13.0–17.0)
MCH: 35.4 pg — ABNORMAL HIGH (ref 26.0–34.0)
MCHC: 34.6 g/dL (ref 30.0–36.0)
MCV: 102.3 fL — ABNORMAL HIGH (ref 80.0–100.0)
Platelets: 83 10*3/uL — ABNORMAL LOW (ref 150–400)
RBC: 3.42 MIL/uL — ABNORMAL LOW (ref 4.22–5.81)
RDW: 17.2 % — ABNORMAL HIGH (ref 11.5–15.5)
WBC: 10 10*3/uL (ref 4.0–10.5)
nRBC: 0 % (ref 0.0–0.2)

## 2023-06-22 LAB — GRAM STAIN

## 2023-06-22 LAB — OSMOLALITY, URINE: Osmolality, Ur: 813 mosm/kg (ref 300–900)

## 2023-06-22 MED ORDER — DOXYCYCLINE HYCLATE 100 MG PO TABS
100.0000 mg | ORAL_TABLET | Freq: Two times a day (BID) | ORAL | Status: DC
  Administered 2023-06-22 – 2023-06-25 (×7): 100 mg via ORAL
  Filled 2023-06-22 (×8): qty 1

## 2023-06-22 MED ORDER — LIDOCAINE HCL (PF) 2 % IJ SOLN
INTRAMUSCULAR | Status: AC
  Filled 2023-06-22: qty 10

## 2023-06-22 MED ORDER — FUROSEMIDE 40 MG PO TABS
40.0000 mg | ORAL_TABLET | Freq: Every day | ORAL | Status: DC
  Administered 2023-06-22 – 2023-06-23 (×2): 40 mg via ORAL
  Filled 2023-06-22 (×3): qty 1

## 2023-06-22 MED ORDER — LACTULOSE 10 GM/15ML PO SOLN
20.0000 g | Freq: Three times a day (TID) | ORAL | Status: DC
  Administered 2023-06-22 – 2023-06-25 (×10): 20 g via ORAL
  Filled 2023-06-22 (×11): qty 30

## 2023-06-22 MED ORDER — TAMSULOSIN HCL 0.4 MG PO CAPS
0.4000 mg | ORAL_CAPSULE | Freq: Every day | ORAL | Status: DC
  Administered 2023-06-22 – 2023-06-25 (×4): 0.4 mg via ORAL
  Filled 2023-06-22 (×5): qty 1

## 2023-06-22 MED ORDER — SODIUM CHLORIDE 0.9 % IV SOLN: INTRAVENOUS | Status: AC

## 2023-06-22 MED ORDER — LIDOCAINE HCL (PF) 2 % IJ SOLN
10.0000 mL | Freq: Once | INTRAMUSCULAR | Status: AC
  Administered 2023-06-22: 10 mL
  Filled 2023-06-22: qty 10

## 2023-06-22 NOTE — Plan of Care (Signed)
  Problem: Health Behavior/Discharge Planning: Goal: Ability to manage health-related needs will improve Outcome: Progressing   Problem: Nutrition: Goal: Adequate nutrition will be maintained Outcome: Progressing   Problem: Pain Management: Goal: General experience of comfort will improve Outcome: Progressing   Problem: Education: Goal: Knowledge of General Education information will improve Description: Including pain rating scale, medication(s)/side effects and non-pharmacologic comfort measures Outcome: Not Progressing   Problem: Activity: Goal: Risk for activity intolerance will decrease Outcome: Not Progressing   Problem: Coping: Goal: Level of anxiety will decrease Outcome: Not Progressing

## 2023-06-22 NOTE — [place] FL2 (Signed)
[place] MEDICAID FL2 LEVEL OF CARE FORM     IDENTIFICATION  Patient Name: Alejandro Porter Birthdate: 1976/05/08 Sex: male Admission Date (Current Location): 06/19/2023  [facility] and [place] Number:  [employer] and Address:  [facility],  [address] [place] [address]      Provider Number: [id number]  Attending Physician Name and Address:  Tyrone Nine, MD  Relative Name and Phone Number:       Current Level of Care: Hospital Recommended Level of Care: Skilled Nursing Facility Prior Approval Number:    Date Approved/Denied:   PASRR Number:    Discharge Plan: SNF    Current Diagnoses: Patient Active Problem List   Diagnosis Date Noted   Protein-calorie malnutrition, severe 06/22/2023   Failure to thrive in adult 06/20/2023   Hypoalbuminemia due to protein-calorie malnutrition (HCC) 06/20/2023   Hyperammonemia (HCC) 06/20/2023   Transaminitis 06/20/2023   AKI (acute kidney injury) (HCC) 06/20/2023   Prolonged QT interval 06/20/2023   History of cirrhosis 06/20/2023   Cellulitis of right lower extremity 06/19/2023   Elevated alkaline phosphatase level 04/11/2023   Obesity (BMI 30-39.9) 04/11/2023   Back pain 04/11/2023   Lower extremity weakness 04/10/2023   Black stool 11/23/2022   Lower extremity edema 11/23/2022   Nausea without vomiting 11/23/2022   Chest pain, pleuritic 11/23/2022   Iron deficiency anemia due to chronic blood loss 09/21/2022   Liver lesion 09/06/2022   Shortness of breath 09/06/2022   Generalized weakness 09/06/2022   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   Rectal bleeding 03/15/2022   Dyspepsia 03/15/2022   Macrocytic anemia 02/01/2021   Umbilical hernia 09/24/2020   Right inguinal hernia 09/24/2020   OSA on CPAP 09/24/2020   Staph Auricularis SBP (spontaneous bacterial peritonitis) (HCC) 09/14/2020   Hyponatremia 09/14/2020   GERD (gastroesophageal reflux disease)  09/14/2020   Dysphagia 08/05/2020   Abdominal pain 08/05/2020   Acute metabolic encephalopathy 05/11/2020   Constipation 02/04/2020   Thrombocytopenia (HCC) 10/20/2019   Acute liver failure without hepatic coma    Other ascites    Colorectal polyps    Cecal polyp    Hepatic cirrhosis (HCC) 09/10/2019   Edema 09/10/2019   Portal hypertension (HCC) 09/10/2019   Liver cirrhosis (HCC) 09/10/2019   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   Anasarca     Orientation RESPIRATION BLADDER Height & Weight     Place, Self  Normal   Weight: 190 lb 14.7 oz (86.6 kg) Height:  5\' 8"  (172.7 cm)  BEHAVIORAL SYMPTOMS/MOOD NEUROLOGICAL BOWEL NUTRITION STATUS        Diet (Heart healthy)  AMBULATORY STATUS COMMUNICATION OF NEEDS Skin   Extensive Assist Verbally                         Personal Care Assistance Level of Assistance  Bathing, Feeding, Dressing Bathing Assistance: Limited assistance Feeding assistance: Independent Dressing Assistance: Limited assistance     Functional Limitations Info  Sight, Hearing, Speech Sight Info: Adequate Hearing Info: Adequate Speech Info: Adequate    SPECIAL CARE FACTORS FREQUENCY  PT (By licensed PT), OT (By licensed OT)     PT Frequency: 5 times weekly OT Frequency: 5 times weekly            Contractures Contractures Info: Not present    Additional Factors Info  Code Status, Allergies Code Status Info: FULL Allergies Info: Chlorthalidone and Metoprolol  Current Medications (06/22/2023):  This is the current hospital active medication list Current Facility-Administered Medications  Medication Dose Route Frequency Provider Last Rate Last Admin   0.9 %  sodium chloride infusion   Intravenous Continuous Tyrone Nine, MD 100 mL/hr at 06/22/23 0842 New Bag at 06/22/23 0842   acetaminophen (TYLENOL) tablet 500 mg  500 mg Oral Q6H PRN Tyrone Nine, MD   500 mg at 06/21/23 1711   cefadroxil (DURICEF) capsule 500 mg  500 mg Oral  BID Tyrone Nine, MD       enoxaparin (LOVENOX) injection 40 mg  40 mg Subcutaneous Q24H Adefeso, Oladapo, DO   40 mg at 06/22/23 0831   feeding supplement (ENSURE ENLIVE / ENSURE PLUS) liquid 237 mL  237 mL Oral TID BM Tyrone Nine, MD   237 mL at 06/21/23 2037   hydrOXYzine (ATARAX) tablet 25 mg  25 mg Oral TID PRN Ntuen, Jesusita Oka, FNP       lactulose (CHRONULAC) 10 GM/15ML solution 40 g  40 g Oral TID Adefeso, Oladapo, DO   40 g at 06/22/23 0831   multivitamin with minerals tablet 1 tablet  1 tablet Oral Daily Tyrone Nine, MD   1 tablet at 06/22/23 1610   nystatin (MYCOSTATIN) 100000 UNIT/ML suspension 500,000 Units  5 mL Oral QID Tyrone Nine, MD   500,000 Units at 06/21/23 2121   ondansetron (ZOFRAN) tablet 4 mg  4 mg Oral Q6H PRN Adefeso, Oladapo, DO       Or   ondansetron (ZOFRAN) injection 4 mg  4 mg Intravenous Q6H PRN Adefeso, Oladapo, DO       pantoprazole (PROTONIX) EC tablet 40 mg  40 mg Oral BID AC Adefeso, Oladapo, DO   40 mg at 06/22/23 0829   rifaximin (XIFAXAN) tablet 550 mg  550 mg Oral BID Tyrone Nine, MD   550 mg at 06/22/23 0830   sertraline (ZOLOFT) tablet 25 mg  25 mg Oral Daily Foye Deer, RPH   25 mg at 06/22/23 0830     Discharge Medications: Please see discharge summary for a list of discharge medications.  Relevant Imaging Results:  Relevant Lab Results:   Additional Information SSN: [id number], [place]

## 2023-06-22 NOTE — TOC Progression Note (Signed)
Transition of Care [facility]) - Progression Note    Patient Details  Name: Alejandro Porter MRN: [id number] Date of Birth: [birth year]  [facility]) CM/SW Contact  [place], [place] Phone Number: 06/22/2023, 11:04 AM  Clinical Narrative:    CSW updated that pt has been seen by psych and cleared. PT is recommending SNF. CSW spoke with pt about this 2 days ago and he was agreeable to referral being sent out. CSW to complete referral and send out for review. TOC to follow.   Expected Discharge Plan: Skilled Nursing Facility Barriers to Discharge: Continued Medical Work up  Expected Discharge Plan and Services In-house Referral: Clinical Social Work Discharge Planning Services: CM Consult   Living arrangements for the past 2 months: Single Family Home                                       Social Determinants of Health (SDOH) Interventions SDOH Screenings   Food Insecurity: No Food Insecurity (06/20/2023)  Housing: Low Risk  (06/20/2023)  Transportation Needs: Unmet Transportation Needs (06/20/2023)  Utilities: Not At Risk (06/20/2023)  Depression (PHQ2-9): High Risk (06/28/2022)  Tobacco Use: Medium Risk (06/20/2023)    Readmission Risk Interventions    06/20/2023    1:20 PM  Readmission Risk Prevention Plan  Transportation Screening Complete  Home Care Screening Complete  Medication Review (RN CM) Complete

## 2023-06-22 NOTE — Progress Notes (Signed)
RE: Alejandro Porter  Date Of Birth: [birth year] Date: 06/22/2023  MUST ID: [id number]  To Whom It May Concern:   Please be advised that the above name patient will require a short-term nursing home stay - anticipated 30 days or less rehabilitation and strengthening. The plan is for return home.

## 2023-06-22 NOTE — Progress Notes (Signed)
Patient tolerated ultrasound guided periumbilical drainage well today and 90 cc of clear yellow fluid removed and sent to lab for processing. Patient verbalized understanding of post procedure instructions and transported back to inpatient bed assignment via stretcher at this time with no acute distress noted.

## 2023-06-22 NOTE — Progress Notes (Addendum)
[facility] PROGRESS NOTE  Alejandro Porter (DOB: [birth year]) XBM:[id number] PCP: Reather Converse, PA-C  Brief Narrative: Alejandro Porter is a 48 y.o. male with a history of NASH cirrhosis s/p TIPS who presented to the ED on 06/19/2023 with suicidal thoughts at home. History limited due to encephalopathy. He was tachycardic, afebrile, WBC 14.7k.   Subjective: Pt feels better, more alert and oriented, having family visit intermittently. He has had some abdominal pain which is chronic but worse last night. Today he doesn't say he hurts everywhere (first time since admission). Guesses it is 2017, knows he's at [facility] in [place], [place]. Has been getting up much more to chair, had BMs last night, eating better. Continues to need urethral catheterizations.   Objective: BP 105/76 (BP Location: Right Arm)   Pulse 99   Temp 98 F (36.7 C) (Oral)   Resp 20   Ht 5\' 8"  (1.727 m)   Wt 86.6 kg   SpO2 96%   BMI 29.03 kg/m   Gen: Chronically ill-appearing male sitting in chair in no acute distress Pulm: Clear, nonlabored  CV: RRR, no MRG, stable LE dependent edema. GI: Soft, protuberant with hyperpigmented umbilical hernia that is tender to efforts at reduction but not to normal palpation.  Neuro: Alert, interactive, conversant, still slowed. Still no asterixis. No new focal deficits. Ext: Warm, no deformities. Skin: BL LE erythema/pink, stable, no exudate or open wounds.   Assessment & Plan: Principal Problem:   Acute metabolic encephalopathy Active Problems:   Thrombocytopenia (HCC)   GERD (gastroesophageal reflux disease)   Macrocytic anemia   Generalized weakness   Cellulitis of right lower extremity   Failure to thrive in adult   Hypoalbuminemia due to protein-calorie malnutrition (HCC)   Hyperammonemia (HCC)   Transaminitis   AKI (acute kidney injury) (HCC)   Prolonged QT interval   History of cirrhosis   Protein-calorie malnutrition,  severe  Multifactorial metabolic encephalopathy: Hyperammonemia likely contributing, though infection not completely ruled out. Note initial concern for RLE cellulitis which is being treated with abx. CT head showed no acute intracranial abnormality. Negative UDS. - Level of orientation has returned to his putative baseline.  - Limit sedating medications.  - Continue high dose lactulose, LBM not 1/16. Added rifaximin given persistent AMS and ammonia elevation.  - Insufficient fluid for diagnostic paracentesis 1/15.   Recent inguinal hernia repair: Dec 2024 at [facility]. No current issues. ?etiology of dot of free air on CT.   Umbilical hernia, dating back to at least 2022. This is tender today, and was difficult to reduce. I will reach out to surgery to evaluate, really appreciate their assistance.  - U/S limited.  Suicidal ideation, depression: This appears to have been situational and in the setting of encephalopathy. This has durably resolved. He has no thoughts of self harm at this time and no plans.  - Psychiatry consulted, started SSRI, and prn hydroxyzine. No suicide precautions required, no psychiatric inpatient admission recommended. Will plan for routine outpatient psychiatry follow up.  NASH cirrhosis: MELD score is 28 (19.6% 73-month mortality) - Appeared dehydrated, so diuretics held. Will likely be able to restart soon - Given his advanced cirrhosis and failure to thrive with malnutrition, we will consult palliative care for additional support.   Acute urinary retention: Prostate grossly unremarkable on CT which also showed normal right kidney, nonobstructing 2mm stones in left kidney without hydronephrosis. Bladder wnl. Urinalysis with some RBCs, protein and WBCs, hyaline casts. - Follow urine culture -  Start tamsulosin - Has required many I/O caths, will insert foley and plan voiding trial over the weekend.  Iron deficiency, macrocytic anemia, thrombocytopenia: B12 and folic acid  are replete. Suspect this is due to cirrhosis.  - Remains hypercoagulable due to cirrhosis, so continuing lovenox 40mg  q24h.   GERD: Thickening of duodenum suggested by CT. Will keep this in mind without directed treatment at this time.  - Continue PPI  Falls at home, deconditioning: Appears to be failing to thrive. CT revealed multiple bilateral rib fractures in stages of healing.  - PT/OT/RD evaluations appreciated. This patient remains deconditioned and will require SNF level rehabilitation prior to returning home. Since his mental status has become more clear, he has absolutely no suicidality.   AKI: Improved with supportive measures, holding diuretics. CrCl now wnl. - Restart lasix   Hyponatremia: Has this chronically, much worse since starting SSRI, confirmed on recheck in PM and initiate work up/treatment as indicated. - Check urine sodium, urine osm, serum osm, and serial Na - Restart loop diuretic  - Stop sertraline.  - Will decrease lactulose dose given he's starting to have 3 BMs/day. Continue rifaximin  Prolonged QT interval: at admission.  - Minimize provocative agents  RLE cellulitis:  - Nonpurulent, converted to cefadroxil, will complete course. Leukocytosis has resolved.   Severe protein calorie malnutrition:  - Supplement protein as much as possible.   Tyrone Nine, MD [facility] [email or web address] 06/22/2023, 12:00 PM

## 2023-06-22 NOTE — Procedures (Signed)
  Procedure:  US aspiration periumbilical collection  90ml clear yellow, sent for labs Preprocedure diagnosis: The primary encounter diagnosis was Cellulitis of right leg. A diagnosis of Acute kidney injury [facility]) was also pertinent to this visit. Postprocedure diagnosis: same EBL:    minimal Complications:   none immediate  See full dictation in [employer].  Thora Lance MD Main # [phone] Pager  [phone] Mobile [phone]

## 2023-06-22 NOTE — Progress Notes (Signed)
Physical Therapy Treatment Patient Details Name: Alejandro Porter MRN: [id number] DOB: 03/20/1976 Today's Date: 06/22/2023   History of Present Illness Alejandro Porter is a 48 y.o. male with medical history significant of  cirrhosis of liver, iron deficiency anemia, GERD, portal hypertension, prior TIPS procedure, CKD who presents to the emergency department due to suicidal thoughts at home.  Patient was unable to provide a history possibly due to confusion, history was obtained from ED physician and ED medical record.  Per report, patient had passed away a few months ago and has since been having worsening symptoms including about 30 pound weight loss over a few months, he has had multiple falls and has become more weaker. Patient ambulates with assistance, but since his brother and girlfriend moved out few months ago, he has had no help.  He complained of several months of chronic headaches and generalized bodyaches.    PT Comments  Pt tolerated today's treatment session, well, showing fatigue after increased repetitions of sit/stand transfers, benefited from controlled breathing cues after session. Today's session addressed progressing functional transfers and strengthening of BLE through transfer training. Pt noted with continued weakness with assist given from recliner powering to stand w/ minimal assist this session. Pt also noted with min assist for ambulation and progressed 22ft from evaluation. Fatigues quickly. Benefits from consistent cues and motivation to maximally participate.. Pt would continue to benefit from skilled acute physical therapy services in order to progress toward POC goals, safety/independence with functional mobility and QOL.     If plan is discharge home, recommend the following: A lot of help with bathing/dressing/bathroom;A lot of help with walking and/or transfers;Help with stairs or ramp for entrance;Assistance with cooking/housework   Can travel by private  vehicle        Equipment Recommendations  None recommended by PT    Recommendations for Other Services       Precautions / Restrictions Precautions Precautions: Fall Restrictions Weight Bearing Restrictions Per Provider Order: No     Mobility  Bed Mobility               General bed mobility comments: received sitting in recliner    Transfers Overall transfer level: Needs assistance Equipment used: Rolling walker (2 wheels) Transfers: Sit to/from Stand Sit to Stand: Min assist           General transfer comment: Multiple sit/stand transfers practiced this session. Slow, labored with min assist for powering to stand. Cues for RW and BUE management with decent carryover. Pt with poor anterior trunk anticipatory reactions when performing transfer training.    Ambulation/Gait Ambulation/Gait assistance: Min assist Gait Distance (Feet): 40 Feet Assistive device: Rolling walker (2 wheels) Gait Pattern/deviations: Decreased step length - right, Decreased step length - left, Decreased stride length, Trunk flexed       General Gait Details: slow gati pattern with improved steadiness during ambulation. No LOB, continues with mild difficulties turning, limited again due to fatigue.   Stairs             Wheelchair Mobility     Tilt Bed    Modified Rankin (Stroke Patients Only)       Balance Overall balance assessment: Needs assistance Sitting-balance support: Feet supported, No upper extremity supported Sitting balance-Leahy Scale: Fair Sitting balance - Comments: fair to good seated at EOB   Standing balance support: Bilateral upper extremity supported, During functional activity, Reliant on assistive device for balance Standing balance-Leahy Scale: Fair Standing balance comment: using RW  Cognition Arousal: Alert Behavior During Therapy: WFL for tasks assessed/performed Overall Cognitive Status: Within  Functional Limits for tasks assessed                                 General Comments: disoriented to year        Exercises General Exercises - Lower Extremity Long Arc Quad: AROM, 10 reps, Seated Toe Raises: AROM, 20 reps, Seated, Both Other Exercises Other Exercises: 7x Sit/stand transfers from recliner with cues- min assist provided progressing to CGA when using BUE cues to pushup to stand.    General Comments        Pertinent Vitals/Pain Pain Assessment Pain Assessment: Faces Faces Pain Scale: Hurts a little bit Pain Location: Back Pain Descriptors / Indicators: Constant Pain Intervention(s): Monitored during session, Repositioned, Relaxation    Home Living                          Prior Function            PT Goals (current goals can now be found in the care plan section) Acute Rehab PT Goals Patient Stated Goal: return home after rehab PT Goal Formulation: With patient/family Time For Goal Achievement: 07/04/23 Potential to Achieve Goals: Good    Frequency    Min 3X/week      PT Plan      Co-evaluation              AM-PAC PT "6 Clicks" Mobility   Outcome Measure  Help needed turning from your back to your side while in a flat bed without using bedrails?: None Help needed moving from lying on your back to sitting on the side of a flat bed without using bedrails?: A Little Help needed moving to and from a bed to a chair (including a wheelchair)?: A Lot Help needed standing up from a chair using your arms (e.g., wheelchair or bedside chair)?: A Lot Help needed to walk in hospital room?: A Lot Help needed climbing 3-5 steps with a railing? : A Lot 6 Click Score: 15    End of Session Equipment Utilized During Treatment: Gait belt Activity Tolerance: Patient tolerated treatment well;Patient limited by fatigue Patient left: in chair;with call bell/phone within reach;with nursing/sitter in room Nurse Communication:  Mobility status PT Visit Diagnosis: Unsteadiness on feet (R26.81);Other abnormalities of gait and mobility (R26.89);Muscle weakness (generalized) (M62.81)     Time: 3664-4034 PT Time Calculation (min) (ACUTE ONLY): 21 min  Charges:    $Therapeutic Activity: 8-22 mins PT General Charges $$ ACUTE PT VISIT: 1 Visit                     Elie Goody, DPT [facility] [place] 336 [phone] office   Nelida Meuse 06/22/2023, 10:24 AM

## 2023-06-23 DIAGNOSIS — G9341 Metabolic encephalopathy: Secondary | ICD-10-CM | POA: Diagnosis not present

## 2023-06-23 LAB — COMPREHENSIVE METABOLIC PANEL
ALT: 46 U/L — ABNORMAL HIGH (ref 0–44)
AST: 102 U/L — ABNORMAL HIGH (ref 15–41)
Albumin: 2.2 g/dL — ABNORMAL LOW (ref 3.5–5.0)
Alkaline Phosphatase: 183 U/L — ABNORMAL HIGH (ref 38–126)
Anion gap: 9 (ref 5–15)
BUN: 24 mg/dL — ABNORMAL HIGH (ref 6–20)
CO2: 20 mmol/L — ABNORMAL LOW (ref 22–32)
Calcium: 8.5 mg/dL — ABNORMAL LOW (ref 8.9–10.3)
Chloride: 95 mmol/L — ABNORMAL LOW (ref 98–111)
Creatinine, Ser: 0.63 mg/dL (ref 0.61–1.24)
GFR, Estimated: >60 mL/min (ref >60)
Glucose, Bld: 132 mg/dL — ABNORMAL HIGH (ref 70–99)
Potassium: 4.1 mmol/L (ref 3.5–5.1)
Sodium: 124 mmol/L — ABNORMAL LOW (ref 135–145)
Total Bilirubin: 4 mg/dL — ABNORMAL HIGH (ref 0.0–1.2)
Total Protein: 5.4 g/dL — ABNORMAL LOW (ref 6.5–8.1)

## 2023-06-23 LAB — CBC
HCT: 34.5 % — ABNORMAL LOW (ref 39.0–52.0)
Hemoglobin: 11.6 g/dL — ABNORMAL LOW (ref 13.0–17.0)
MCH: 34.3 pg — ABNORMAL HIGH (ref 26.0–34.0)
MCHC: 33.6 g/dL (ref 30.0–36.0)
MCV: 102.1 fL — ABNORMAL HIGH (ref 80.0–100.0)
Platelets: 47 10*3/uL — ABNORMAL LOW (ref 150–400)
RBC: 3.38 MIL/uL — ABNORMAL LOW (ref 4.22–5.81)
RDW: 17.1 % — ABNORMAL HIGH (ref 11.5–15.5)
WBC: 8.8 10*3/uL (ref 4.0–10.5)
nRBC: 0 % (ref 0.0–0.2)

## 2023-06-23 MED ORDER — MELATONIN 3 MG PO TABS
6.0000 mg | ORAL_TABLET | Freq: Every evening | ORAL | Status: DC | PRN
  Administered 2023-06-23 – 2023-06-24 (×2): 6 mg via ORAL
  Filled 2023-06-23 (×2): qty 2

## 2023-06-23 MED ORDER — CHLORHEXIDINE GLUCONATE CLOTH 2 % EX PADS
6.0000 | MEDICATED_PAD | Freq: Every day | CUTANEOUS | Status: DC
  Administered 2023-06-23 – 2023-06-26 (×4): 6 via TOPICAL

## 2023-06-23 NOTE — Progress Notes (Signed)
[facility] PROGRESS NOTE  Alejandro Porter (DOB: [birth year]) ION:[id number] PCP: Reather Converse, PA-C  Brief Narrative: Alejandro Porter is a 48 y.o. male with a history of NASH cirrhosis s/p TIPS who presented to the ED on 06/19/2023 with suicidal thoughts at home. History limited due to encephalopathy. He was tachycardic, afebrile, WBC 14.7k.   Subjective: Abdominal pain improved throughout the day yesterday. He's eating a bunch of candy and sodas. No nausea or vomiting, fevers or chills, or chest pain or dyspnea. Feels quite weak but is motivated to return home instead of rehab, sitting in chair often.   Objective: BP 97/65 (BP Location: Left Arm)   Pulse 96   Temp 97.6 F (36.4 C) (Axillary)   Resp (!) 22   Ht 5\' 8"  (1.727 m)   Wt 86.6 kg   SpO2 96%   BMI 29.03 kg/m   Gen: No distress, chronically ill-appearing  Pulm: Clear, nonlabored  CV: RRR, no MRG, stable LE edema GI: Soft, protuberant without being taut, umbilical hernia smaller and nontender, +BS Neuro: Alert and interactive, more oriented (guessed the year was 2026), still somewhat cognitively slowed but much improved. No asterixis. No new focal deficits. Ext: Warm, no deformities, dry. Skin: No new rashes, lesions or ulcers on visualized skin   Assessment & Plan: Principal Problem:   Acute metabolic encephalopathy Active Problems:   Thrombocytopenia (HCC)   GERD (gastroesophageal reflux disease)   Macrocytic anemia   Generalized weakness   Cellulitis of right lower extremity   Failure to thrive in adult   Hypoalbuminemia due to protein-calorie malnutrition (HCC)   Hyperammonemia (HCC)   Transaminitis   AKI (acute kidney injury) (HCC)   Prolonged QT interval   History of cirrhosis   Protein-calorie malnutrition, severe  Multifactorial metabolic encephalopathy: Hyperammonemia likely contributing, though infection not completely ruled out. Note initial concern for RLE cellulitis which is  being treated with abx. CT head showed no acute intracranial abnormality. Negative UDS. - Level of orientation has returned to his putative baseline.  - Limit sedating medications.  - Continue high dose lactulose. Added rifaximin given persistent AMS and ammonia elevation.  - Insufficient fluid for diagnostic paracentesis 1/15.   Umbilical hernia, dating back to at least 2022 with complex fluid collection: ?seroma - D/w surgery, Dr. Lovell Sheehan, now s/p 90cc clear yellow fluid drained 1/17 by Dr. Deanne Coffer. Negative gram stain, cell counts not appearing overtly infected, though doxycycline was started. Will DC if culture returns negative.   Suicidal ideation, depression: This appears to have been situational and in the setting of encephalopathy. This has durably resolved. He has no thoughts of self harm at this time and no plans.  - Psychiatry consulted, started SSRI (since stopped due to hyponatremia), and prn hydroxyzine. No suicide precautions required, no psychiatric inpatient admission recommended. Will plan for routine outpatient psychiatry follow up.  NASH cirrhosis: MELD score is 28 (19.6% 27-month mortality) - Appeared dehydrated, so diuretics held. Restarted 1/17. - Given his advanced cirrhosis and failure to thrive with malnutrition, palliative care is consulted for additional support.   Acute urinary retention: Prostate grossly unremarkable on CT which also showed normal right kidney, nonobstructing 2mm stones in left kidney without hydronephrosis. Bladder wnl. Urinalysis with some RBCs, protein and WBCs, hyaline casts. Urine culture negative.  - Continue tamsulosin - Anticipate voiding trial 1/19.   Iron deficiency, macrocytic anemia, thrombocytopenia: B12 and folic acid are replete. Suspect this is due to cirrhosis.  - Remains hypercoagulable due to cirrhosis, but  platelets down < 50k, so lovenox stopped for now.   GERD: Thickening of duodenum suggested by CT. Will keep this in mind  without directed treatment at this time.  - Continue PPI  Falls at home, deconditioning: Appears to be failing to thrive. CT revealed multiple bilateral rib fractures in stages of healing.  - PT/OT/RD evaluations appreciated. This patient remains deconditioned and will require SNF level rehabilitation prior to returning home. Since his mental status has become more clear, he has absolutely no suicidality.   AKI: Improved with supportive measures, holding diuretics. CrCl now wnl. - Restarted lasix, continue monitoring.   Hyponatremia: Has this chronically related to cirrhosis but worsened after SSRI introduced, since stopped.  - Continue lasix, monitor sodium level, stabilized at 124, asymptomatic.  - Will decrease lactulose dose given he's starting to have 3 BMs/day. Continue rifaximin  Prolonged QT interval: at admission.  - Minimize provocative agents  RLE cellulitis:  - Nonpurulent, converted to cefadroxil, will complete course. Leukocytosis has resolved.   Severe protein calorie malnutrition:  - Supplement protein as much as possible.   Recent inguinal hernia repair: Dec 2024 at [facility]. No current issues. ?etiology of dot of free air on CT.   Tyrone Nine, MD [facility] [email or web address] 06/23/2023, 2:44 PM

## 2023-06-23 NOTE — Progress Notes (Signed)
   06/23/23 1600  Assess: MEWS Score  Temp (!) 97.5 F (36.4 C)  BP 95/68  MAP (mmHg) 78  Pulse Rate 94  Resp 19  SpO2 94 %  O2 Device Room Air  Assess: MEWS Score  MEWS Temp 0  MEWS Systolic 1  MEWS Pulse 0  MEWS RR 0  MEWS LOC 0  MEWS Score 1  MEWS Score Color Green  Assess: SIRS CRITERIA  SIRS Temperature  0  SIRS Respirations  0  SIRS Pulse 1  SIRS WBC 0  SIRS Score Sum  1

## 2023-06-23 NOTE — Plan of Care (Signed)
  Problem: Health Behavior/Discharge Planning: Goal: Ability to manage health-related needs will improve Outcome: Progressing   Problem: Nutrition: Goal: Adequate nutrition will be maintained Outcome: Progressing   Problem: Coping: Goal: Level of anxiety will decrease Outcome: Progressing   

## 2023-06-23 NOTE — Progress Notes (Signed)
Patient yelling out complaining of shortness of breath and chest pain. Vital signs: T-97.5, R-19, BP-109/67, P-93, O2-97% at room air. Lung sounds clear/diminished, EKG obtained. Patient somewhat anxious talking about family and being alone PRN given, see MAR. MD Jarvis Newcomer made aware. MD assessing patient at this time.

## 2023-06-23 NOTE — Progress Notes (Signed)
   06/23/23 1300  Assess: MEWS Score  Temp 97.6 F (36.4 C)  BP 97/65  MAP (mmHg) 77  Pulse Rate 96  Resp (!) 22  SpO2 96 %  O2 Device Room Air  Assess: MEWS Score  MEWS Temp 0  MEWS Systolic 1  MEWS Pulse 0  MEWS RR 1  MEWS LOC 0  MEWS Score 2  MEWS Score Color Yellow  Assess: if the MEWS score is Yellow or Red  Were vital signs accurate and taken at a resting state? Yes  Does the patient meet 2 or more of the SIRS criteria? Yes  Does the patient have a confirmed or suspected source of infection? No  MEWS guidelines implemented  Yes, yellow  Treat  MEWS Interventions Considered administering scheduled or prn medications/treatments as ordered  Take Vital Signs  Increase Vital Sign Frequency  Yellow: Q2hr x1, continue Q4hrs until patient remains green for 12hrs  Escalate  MEWS: Escalate Yellow: Discuss with charge nurse and consider notifying provider and/or RRT  Notify: Charge Nurse/RN  Name of Charge Nurse/RN Notified Museum/gallery curator  Provider Notification  Provider Name/Title MD Jarvis Newcomer  Date Provider Notified 06/23/23  Time Provider Notified 1340  Method of Notification Page (Secure chat)  Notification Reason Other (Comment) (Yellow MEWs)  Provider response No new orders  Date of Provider Response 06/23/23  Time of Provider Response 1341  Assess: SIRS CRITERIA  SIRS Temperature  0  SIRS Respirations  1  SIRS Pulse 1  SIRS WBC 0  SIRS Score Sum  2

## 2023-06-23 NOTE — Progress Notes (Signed)
Patient stated he did not feel well, verbalized complaints of shortness of breath, noted patient winded and gasping for air at times. Vital signs: T-97.6. R-22, BP-114/71, P-98, O2-97% on room air. Patient assisted to bed, by two staff members. MD Jarvis Newcomer made aware.

## 2023-06-24 ENCOUNTER — Inpatient Hospital Stay (HOSPITAL_COMMUNITY): Payer: Medicaid Other

## 2023-06-24 DIAGNOSIS — G9341 Metabolic encephalopathy: Secondary | ICD-10-CM | POA: Diagnosis not present

## 2023-06-24 LAB — CBC
HCT: 35.3 % — ABNORMAL LOW (ref 39.0–52.0)
Hemoglobin: 12.6 g/dL — ABNORMAL LOW (ref 13.0–17.0)
MCH: 35.7 pg — ABNORMAL HIGH (ref 26.0–34.0)
MCHC: 35.7 g/dL (ref 30.0–36.0)
MCV: 100 fL (ref 80.0–100.0)
Platelets: 74 10*3/uL — ABNORMAL LOW (ref 150–400)
RBC: 3.53 MIL/uL — ABNORMAL LOW (ref 4.22–5.81)
RDW: 17.6 % — ABNORMAL HIGH (ref 11.5–15.5)
WBC: 10.1 10*3/uL (ref 4.0–10.5)
nRBC: 0 % (ref 0.0–0.2)

## 2023-06-24 LAB — COMPREHENSIVE METABOLIC PANEL
ALT: 54 U/L — ABNORMAL HIGH (ref 0–44)
AST: 114 U/L — ABNORMAL HIGH (ref 15–41)
Albumin: 2.4 g/dL — ABNORMAL LOW (ref 3.5–5.0)
Alkaline Phosphatase: 240 U/L — ABNORMAL HIGH (ref 38–126)
Anion gap: 9 (ref 5–15)
BUN: 26 mg/dL — ABNORMAL HIGH (ref 6–20)
CO2: 20 mmol/L — ABNORMAL LOW (ref 22–32)
Calcium: 8.7 mg/dL — ABNORMAL LOW (ref 8.9–10.3)
Chloride: 90 mmol/L — ABNORMAL LOW (ref 98–111)
Creatinine, Ser: 0.66 mg/dL (ref 0.61–1.24)
GFR, Estimated: >60 mL/min (ref >60)
Glucose, Bld: 128 mg/dL — ABNORMAL HIGH (ref 70–99)
Potassium: 4.2 mmol/L (ref 3.5–5.1)
Sodium: 119 mmol/L — CL (ref 135–145)
Total Bilirubin: 5.3 mg/dL — ABNORMAL HIGH (ref 0.0–1.2)
Total Protein: 5.9 g/dL — ABNORMAL LOW (ref 6.5–8.1)

## 2023-06-24 LAB — CULTURE, BLOOD (ROUTINE X 2)
Culture: NO GROWTH
Culture: NO GROWTH
Special Requests: ADEQUATE
Special Requests: ADEQUATE

## 2023-06-24 LAB — SODIUM, URINE, RANDOM: Sodium, Ur: <10 mmol/L

## 2023-06-24 LAB — OSMOLALITY, URINE: Osmolality, Ur: 516 mosm/kg (ref 300–900)

## 2023-06-24 MED ORDER — MELATONIN 3 MG PO TABS: 12.0000 mg | ORAL_TABLET | Freq: Every evening | ORAL | Status: DC | PRN

## 2023-06-24 MED ORDER — ENOXAPARIN SODIUM 40 MG/0.4ML IJ SOSY
40.0000 mg | PREFILLED_SYRINGE | Freq: Every day | INTRAMUSCULAR | Status: DC
  Administered 2023-06-24: 40 mg via SUBCUTANEOUS
  Filled 2023-06-24: qty 0.4

## 2023-06-24 MED ORDER — FUROSEMIDE 40 MG PO TABS
40.0000 mg | ORAL_TABLET | Freq: Every day | ORAL | Status: DC
  Administered 2023-06-24: 40 mg via ORAL

## 2023-06-24 NOTE — Progress Notes (Signed)
[facility] PROGRESS NOTE  Alejandro Porter (DOB: [birth year]) KGM:[id number] PCP: Reather Converse, PA-C  Brief Narrative: Alejandro Porter is a 48 y.o. male with a history of NASH cirrhosis s/p TIPS who presented to the ED on 06/19/2023 with suicidal thoughts at home. History limited due to encephalopathy. He was tachycardic, afebrile, WBC 14.7k.   Subjective: States he follows up with a sodium doctor named "[facility] [employer]" in [place]. Thinks it's 2026. Tells me he's been here for 2 weeks and he wants to go home. Asks me to get him his phone so he can call a lawyer because I tell him I can't discharge him yet. He's been eating and drinking ok, reportedly having a lot of sodas. He reports no dyspnea (had been reporting that last night).   Objective: BP 119/72 (BP Location: Right Arm)   Pulse 94   Temp (!) 97.4 F (36.3 C) (Oral)   Resp 18   Ht 5\' 8"  (1.727 m)   Wt 86.6 kg   SpO2 93%   BMI 29.03 kg/m   Gen: Chronically ill-appearing male in no acute distress Pulm: Clear, nonlabored on room air  CV: RRR, stable LE edema with chronic skin changes GI: Flaccid distention with reduced size of umbilical hernia. No focal tenderness Neuro: R[place] and is interactive, not oriented, poor judgment and insight. No asterixis. No new focal deficits. Ext: Warm, no deformities.   Assessment & Plan: Principal Problem:   Acute metabolic encephalopathy Active Problems:   Thrombocytopenia (HCC)   GERD (gastroesophageal reflux disease)   Macrocytic anemia   Generalized weakness   Cellulitis of right lower extremity   Failure to thrive in adult   Hypoalbuminemia due to protein-calorie malnutrition (HCC)   Hyperammonemia (HCC)   Transaminitis   AKI (acute kidney injury) (HCC)   Prolonged QT interval   History of cirrhosis   Protein-calorie malnutrition, severe  Multifactorial metabolic encephalopathy: Hyperammonemia likely contributing, though infection not completely ruled  out. Note initial concern for RLE cellulitis which is being treated with abx. CT head showed no acute intracranial abnormality. Negative UDS. - Level of orientation has returned to his putative baseline. Though he remains disoriented to year and with insight limitation which precludes his capacity to make the decision to leave the hospital against medical advice at this time.  - Limit sedating medications.  - Continue high dose lactulose. Added rifaximin given persistent AMS and ammonia elevation.  - Insufficient fluid for diagnostic paracentesis 1/15.  Umbilical hernia, dating back to at least 2022 with complex fluid collection: ?seroma - D/w surgery, Dr. Lovell Sheehan, now s/p 90cc clear yellow fluid drained 1/17 by Dr. Deanne Coffer. Negative gram stain, cell counts not appearing overtly infected, though doxycycline was started. Will DC if culture returns negative.   Suicidal ideation, depression: This appears to have been situational and in the setting of encephalopathy. This has durably resolved. He has no thoughts of self harm at this time and no plans.  - Psychiatry consulted, started SSRI (since stopped due to hyponatremia), and prn hydroxyzine. No suicide precautions required, no psychiatric inpatient admission recommended. Will plan for routine outpatient psychiatry follow up.  NASH cirrhosis: MELD score is 28 (19.6% 82-month mortality) - Appeared dehydrated, so diuretics held. Restarted 1/17. - Given his advanced cirrhosis and failure to thrive with malnutrition, palliative care is consulted for additional support.   Acute urinary retention: Prostate grossly unremarkable on CT which also showed normal right kidney, nonobstructing 2mm stones in left kidney without hydronephrosis. Bladder wnl. Urinalysis  with some RBCs, protein and WBCs, hyaline casts. Urine culture negative.  - Continue tamsulosin - Will pursue voiding trial.   Iron deficiency, macrocytic anemia, thrombocytopenia: B12 and folic acid  are replete. Suspect this is due to cirrhosis.  - Remains hypercoagulable due to cirrhosis, platelets are back up, so will restart lovenox VTE ppx, continue SCDs. ut platelets down < 50k, so lovenox stopped for now.   GERD: Thickening of duodenum suggested by CT. Will keep this in mind without directed treatment at this time.  - Continue PPI  Falls at home, deconditioning: Appears to be failing to thrive. CT revealed multiple bilateral rib fractures in stages of healing.  - PT/OT/RD evaluations appreciated. This patient remains deconditioned and will require SNF level rehabilitation prior to returning home. Since his mental status has become more clear, he has absolutely no suicidality.   AKI: Improved with supportive measures, holding diuretics. CrCl now wnl. - Restarted lasix, continue monitoring.   Hyponatremia: Has this chronically related to cirrhosis but worsened after SSRI introduced, since stopped. Still worse today.  - Institute strict fluid restriction - Repeat urine sodium, Osm.  - Continue lasix - Discussed with nephrology, Dr. Marisue Humble, will have formal consultation 1/20.    Prolonged QT interval: at admission.  - Minimize provocative agents  RLE cellulitis:  - Nonpurulent, converted to cefadroxil, will complete course. Leukocytosis has resolved.   Severe protein calorie malnutrition:  - Supplement protein as much as possible.   Recent inguinal hernia repair: Dec 2024 at [facility]. No current issues. ?etiology of dot of free air on CT.   Tyrone Nine, MD [facility] [email or web address] 06/24/2023, 11:35 AM

## 2023-06-24 NOTE — Progress Notes (Signed)
Patient tearful this shift, states " I just want to go home" . Emotional support offered to patient that he is not medically stable to discharge at this time and that we are doing all we can to get him better. Redirection helpful ,. Helped patient from bed to chair and placed his lunch in front of him, he did eat ice cream and a few bites of cake , put Flash movie on TV for patient and offered to get him anything. He said he was ok at this time. Will continue to monitor patient. No suicidal ideations or self harm statements , patient just verbalized he wants to go home. Call light beside patient

## 2023-06-24 NOTE — Progress Notes (Signed)
Patient is crying stating that he does not know why this is happening to him. He does not know why his family left him and he feel like they do not love him anymore.

## 2023-06-24 NOTE — Plan of Care (Signed)
  Problem: Education: Goal: Knowledge of General Education information will improve Description: Including pain rating scale, medication(s)/side effects and non-pharmacologic comfort measures 06/24/2023 0958 by Vergia Alcon, RN Outcome: Progressing 06/24/2023 0958 by Vergia Alcon, RN Outcome: Progressing   Problem: Health Behavior/Discharge Planning: Goal: Ability to manage health-related needs will improve 06/24/2023 0958 by Vergia Alcon, RN Outcome: Progressing 06/24/2023 0958 by Vergia Alcon, RN Outcome: Progressing

## 2023-06-24 NOTE — Progress Notes (Signed)
Date and time results received: 06/24/23 0755 (use smartphrase ".now" to insert current time)  Test: cbc Critical Value: Sodium of 119  Name of Provider Notified: Dr. Jarvis Newcomer  Orders Received? Or Actions Taken?: awaiting new orders if any

## 2023-06-24 NOTE — Progress Notes (Signed)
Patient voided post foley removal , small amount of blood noted. Denies pain or discomfort with urination. Dr. Jarvis Newcomer aware

## 2023-06-24 NOTE — Plan of Care (Signed)
  Problem: Education: Goal: Knowledge of General Education information will improve Description Including pain rating scale, medication(s)/side effects and non-pharmacologic comfort measures Outcome: Progressing   Problem: Health Behavior/Discharge Planning: Goal: Ability to manage health-related needs will improve Outcome: Progressing   

## 2023-06-25 ENCOUNTER — Other Ambulatory Visit: Payer: Self-pay

## 2023-06-25 ENCOUNTER — Encounter (HOSPITAL_COMMUNITY): Payer: Self-pay | Admitting: Internal Medicine

## 2023-06-25 DIAGNOSIS — R627 Adult failure to thrive: Secondary | ICD-10-CM | POA: Diagnosis not present

## 2023-06-25 DIAGNOSIS — Z515 Encounter for palliative care: Secondary | ICD-10-CM

## 2023-06-25 DIAGNOSIS — Z7189 Other specified counseling: Secondary | ICD-10-CM | POA: Diagnosis not present

## 2023-06-25 DIAGNOSIS — G9341 Metabolic encephalopathy: Secondary | ICD-10-CM | POA: Diagnosis not present

## 2023-06-25 LAB — CBC
HCT: 33.3 % — ABNORMAL LOW (ref 39.0–52.0)
Hemoglobin: 11.9 g/dL — ABNORMAL LOW (ref 13.0–17.0)
MCH: 35.8 pg — ABNORMAL HIGH (ref 26.0–34.0)
MCHC: 35.7 g/dL (ref 30.0–36.0)
MCV: 100.3 fL — ABNORMAL HIGH (ref 80.0–100.0)
Platelets: 62 10*3/uL — ABNORMAL LOW (ref 150–400)
RBC: 3.32 MIL/uL — ABNORMAL LOW (ref 4.22–5.81)
RDW: 17.8 % — ABNORMAL HIGH (ref 11.5–15.5)
WBC: 7.3 10*3/uL (ref 4.0–10.5)
nRBC: 0 % (ref 0.0–0.2)

## 2023-06-25 LAB — URINALYSIS, COMPLETE (UACMP) WITH MICROSCOPIC
Bilirubin Urine: NEGATIVE
Glucose, UA: NEGATIVE mg/dL
Hgb urine dipstick: NEGATIVE
Ketones, ur: NEGATIVE mg/dL
Leukocytes,Ua: NEGATIVE
Nitrite: NEGATIVE
Protein, ur: NEGATIVE mg/dL
Specific Gravity, Urine: 1.024 (ref 1.005–1.030)
pH: 5 (ref 5.0–8.0)

## 2023-06-25 LAB — BASIC METABOLIC PANEL
Anion gap: 7 (ref 5–15)
BUN: 33 mg/dL — ABNORMAL HIGH (ref 6–20)
CO2: 19 mmol/L — ABNORMAL LOW (ref 22–32)
Calcium: 8.5 mg/dL — ABNORMAL LOW (ref 8.9–10.3)
Chloride: 87 mmol/L — ABNORMAL LOW (ref 98–111)
Creatinine, Ser: 0.78 mg/dL (ref 0.61–1.24)
GFR, Estimated: >60 mL/min (ref >60)
Glucose, Bld: 132 mg/dL — ABNORMAL HIGH (ref 70–99)
Potassium: 4.5 mmol/L (ref 3.5–5.1)
Sodium: 113 mmol/L — CL (ref 135–145)

## 2023-06-25 LAB — OSMOLALITY: Osmolality: 261 mosm/kg — ABNORMAL LOW (ref 275–295)

## 2023-06-25 LAB — SODIUM
Sodium: 114 mmol/L — CL (ref 135–145)
Sodium: 116 mmol/L — CL (ref 135–145)
Sodium: 116 mmol/L — CL (ref 135–145)
Sodium: 117 mmol/L — CL (ref 135–145)
Sodium: 120 mmol/L — ABNORMAL LOW (ref 135–145)

## 2023-06-25 LAB — PROTIME-INR
INR: 1.5 — ABNORMAL HIGH (ref 0.8–1.2)
Prothrombin Time: 18.1 s — ABNORMAL HIGH (ref 11.4–15.2)

## 2023-06-25 LAB — T4, FREE: Free T4: 1.25 ng/dL — ABNORMAL HIGH (ref 0.61–1.12)

## 2023-06-25 LAB — TSH: TSH: 0.992 u[IU]/mL (ref 0.350–4.500)

## 2023-06-25 LAB — MRSA NEXT GEN BY PCR, NASAL: MRSA by PCR Next Gen: NOT DETECTED

## 2023-06-25 LAB — SODIUM, URINE, RANDOM: Sodium, Ur: <10 mmol/L

## 2023-06-25 MED ORDER — ALBUMIN HUMAN 25 % IV SOLN
12.5000 g | Freq: Four times a day (QID) | INTRAVENOUS | Status: AC
  Administered 2023-06-25 – 2023-06-26 (×3): 12.5 g via INTRAVENOUS
  Filled 2023-06-25 (×3): qty 50

## 2023-06-25 MED ORDER — MIDODRINE HCL 5 MG PO TABS
10.0000 mg | ORAL_TABLET | Freq: Three times a day (TID) | ORAL | Status: DC
  Administered 2023-06-25: 10 mg via ORAL
  Filled 2023-06-25 (×2): qty 2

## 2023-06-25 MED ORDER — GLYCOPYRROLATE 0.2 MG/ML IJ SOLN
0.2000 mg | Freq: Four times a day (QID) | INTRAMUSCULAR | Status: DC | PRN
  Administered 2023-06-25: 0.2 mg via INTRAVENOUS
  Filled 2023-06-25: qty 1

## 2023-06-25 MED ORDER — ORAL CARE MOUTH RINSE
15.0000 mL | OROMUCOSAL | Status: DC | PRN
  Administered 2023-06-30: 15 mL via OROMUCOSAL

## 2023-06-25 MED ORDER — SODIUM CHLORIDE 0.9% FLUSH: 10.0000 mL | INTRAVENOUS | Status: DC | PRN

## 2023-06-25 MED ORDER — SODIUM CHLORIDE 3 % IV SOLN
INTRAVENOUS | Status: DC
  Filled 2023-06-25 (×4): qty 500

## 2023-06-25 MED ORDER — LORAZEPAM 2 MG/ML IJ SOLN
0.5000 mg | INTRAMUSCULAR | Status: DC | PRN
  Administered 2023-06-25: 0.5 mg via INTRAVENOUS
  Filled 2023-06-25: qty 1

## 2023-06-25 MED ORDER — PHYTONADIONE 5 MG PO TABS
5.0000 mg | ORAL_TABLET | Freq: Every day | ORAL | Status: DC
  Administered 2023-06-25: 5 mg via ORAL
  Filled 2023-06-25 (×2): qty 1

## 2023-06-25 MED ORDER — ORAL CARE MOUTH RINSE
15.0000 mL | OROMUCOSAL | Status: DC
  Administered 2023-06-25 – 2023-06-26 (×2): 15 mL via OROMUCOSAL

## 2023-06-25 MED ORDER — SODIUM CHLORIDE 0.9% FLUSH
10.0000 mL | Freq: Two times a day (BID) | INTRAVENOUS | Status: DC
  Administered 2023-06-25 – 2023-06-26 (×2): 10 mL

## 2023-06-25 NOTE — Progress Notes (Signed)
Received page from primary service in regards to worsening hyponatremia. Was started on lasix yesterday. Na dropped to 113. Recommend rehecking serum osm, urine osm, urine sodium, TSH. Also, recommend moving to ICU and start 3%. Would avoid bolus and would start slow at 30cc/hr after labs collected. Avoid overcorrection especially given his other comorbidities. Will be seen by covering nephrologist this AM. Please page/call with any questions/concerns in the interim.  Anthony Sar, MD [facility]

## 2023-06-25 NOTE — TOC Progression Note (Signed)
Transition of Care [facility]) - Progression Note    Patient Details  Name: Alejandro Porter MRN: [id number] Date of Birth: 1976/05/16  [facility] CM/SW Contact  Villa Herb, [place] Phone Number: 06/25/2023, 10:32 AM  Clinical Narrative:    Patient continues to have no bed offers. CSW reaching out to facilities requesting they review pts referral again if able. TOC to follow.   Expected Discharge Plan: Skilled Nursing Facility Barriers to Discharge: Continued Medical Work up  Expected Discharge Plan and Services In-house Referral: Clinical Social Work Discharge Planning Services: CM Consult   Living arrangements for the past 2 months: Single Family Home                                       Social Determinants of Health (SDOH) Interventions SDOH Screenings   Food Insecurity: No Food Insecurity (06/20/2023)  Housing: Low Risk  (06/20/2023)  Transportation Needs: Unmet Transportation Needs (06/20/2023)  Utilities: Not At Risk (06/20/2023)  Depression (PHQ2-9): High Risk (06/28/2022)  Tobacco Use: Medium Risk (06/25/2023)    Readmission Risk Interventions    06/20/2023    1:20 PM  Readmission Risk Prevention Plan  Transportation Screening Complete  Home Care Screening Complete  Medication Review (RN CM) Complete

## 2023-06-25 NOTE — Progress Notes (Signed)
[facility] PROGRESS NOTE  Alejandro Porter (DOB: 21-Aug-1975) ZOX:[id number] PCP: Reather Converse, PA-C  Brief Narrative: Alejandro Porter is a 48 y.o. male with a history of NASH cirrhosis s/p TIPS who presented to the ED on 06/19/2023 with suicidal thoughts at home. History limited due to encephalopathy. He was tachycardic, afebrile, WBC 14.7k.   Subjective: Noted to have worsening sodium level and transferred to ICU where I saw him this morning. He had no complaints. Later in the morning he appears to be hallucinating, thinking family is in room, though they are not. He does say he is thirsty and is drinking milk.  Objective: BP 101/60 (BP Location: Right Arm)   Pulse 91   Temp 98 F (36.7 C) (Oral)   Resp (!) 21   Ht 5\' 8"  (1.727 m)   Wt 86.6 kg   SpO2 100%   BMI 29.03 kg/m   Gen: Ill-appearing male in no distress Pulm: Clear, nonlabored, diminished  CV: RRR, 3+ LE edema, no JVD GI: Large, soft and not tender, +BS Neuro: Alert, interactive but not completely oriented. He can have a conversation with me though. No asterixis, mild intention tremor. No new focal deficits. No seizure activity seen or reported. Ext: Warm, dry Skin: Erythema to lower legs is nearly resolved. No other/new rashes, lesions or ulcers on visualized skin   Assessment & Plan: Principal Problem:   Acute metabolic encephalopathy Active Problems:   Thrombocytopenia (HCC)   GERD (gastroesophageal reflux disease)   Macrocytic anemia   Generalized weakness   Cellulitis of right lower extremity   Failure to thrive in adult   Hypoalbuminemia due to protein-calorie malnutrition (HCC)   Hyperammonemia (HCC)   Transaminitis   AKI (acute kidney injury) (HCC)   Prolonged QT interval   History of cirrhosis   Protein-calorie malnutrition, severe  Hyponatremia: Has this chronically related to cirrhosis but worsened after SSRI introduced, since stopped. Worsened with reintroduction of lasix as  well.  - Transfer to ICU, start hypertonic saline and monitor serial Na levels. Guarded prognosis.  - Stop lasix. Has significant intravascular depletion with extravascular volume excess. Albumin would not appreciably change his outcome given his normotension at this time.  - Appreciate nephrology consultation.  Multifactorial metabolic encephalopathy: Hyperammonemia likely contributing, though infection not completely ruled out. Note initial concern for RLE cellulitis which is being treated with abx. CT head showed no acute intracranial abnormality. Negative UDS. - Level of orientation has returned to his putative baseline. Though he remains disoriented to year and with insight limitation which precludes his capacity to make the decision to leave the hospital against medical advice at this time.  - Limit sedating medications, though his delirium is now limiting patient care, pulling IV's, will order ativan lowest effective dose prn. - Continue high dose lactulose. Added rifaximin given persistent AMS and ammonia elevation.  - Insufficient fluid for diagnostic paracentesis 1/15.  Umbilical hernia, dating back to at least 2022 with complex fluid collection: ?seroma - D/w surgery, Dr. Lovell Sheehan, now s/p 90cc clear yellow fluid drained 1/17 by Dr. Deanne Coffer. Negative gram stain, cell counts not appearing overtly infected, though doxycycline was started. This would also cover a possible atypical infection noted on CXR.   Suicidal ideation, depression: This appears to have been situational and in the setting of encephalopathy. This has durably resolved. He has no thoughts of self harm at this time and no plans.  - Psychiatry consulted, started SSRI (since stopped due to hyponatremia), and prn hydroxyzine. No  suicide precautions required, no psychiatric inpatient admission recommended. Will plan for routine outpatient psychiatry follow up.  NASH cirrhosis: MELD score is 28 (19.6% 40-month mortality) -  Appeared dehydrated, so diuretics held. Restarted due to volume overload but UNa low and intravascularly depleted so stopped lasix again.  - Coagulopathy noted. Recheck INR to update his baseline, but will likely give vitamin K orally of lip bleeding returns/persists after holding lovenox.  - Given his advanced cirrhosis and failure to thrive with malnutrition, palliative care is consulted for additional support.   Acute urinary retention: Prostate grossly unremarkable on CT which also showed normal right kidney, nonobstructing 2mm stones in left kidney without hydronephrosis. Bladder wnl. Urinalysis with some RBCs, protein and WBCs, hyaline casts. Urine culture negative.  - Continue tamsulosin - Will pursue voiding trial pending clinical progress.   Iron deficiency, macrocytic anemia, thrombocytopenia: B12 and folic acid are replete. Suspect this is due to cirrhosis.  - Having lip bleeding, so have to stop lovenox VTE ppx. Use SCDs. Recheck INR.  GERD: Thickening of duodenum suggested by CT. Will keep this in mind without directed treatment at this time.  - Continue PPI  Falls at home, deconditioning: Appears to be failing to thrive. CT revealed multiple bilateral rib fractures in stages of healing.  - PT/OT/RD evaluations appreciated. This patient remains deconditioned and will require SNF level rehabilitation prior to returning home. Since his mental status has become more clear, he has absolutely no suicidality.   AKI: Improved with supportive measures, holding diuretics. CrCl now wnl. - Restarted lasix, continue monitoring.     Prolonged QT interval: at admission.  - Minimize provocative agents  RLE cellulitis:  - Nonpurulent, converted to cefadroxil, will complete course. Leukocytosis has resolved.   Severe protein calorie malnutrition:  - Supplement protein as much as possible.   Recent inguinal hernia repair: Dec 2024 at [facility]. No current issues. ?etiology of dot of free  air on CT.   Tyrone Nine, MD [facility] [email or web address] 06/25/2023, 11:14 AM

## 2023-06-25 NOTE — Progress Notes (Signed)
Patient seen on evening rounds. He looks worse. He was alert and interactive asking for milk this morning. He's now needing mittens to leave apparati alone. This is despite his sodium improving some.   He's got eyes open but not really tracking me in the room this evening. Moans some but doesn't verbalize. BP has been declining, now on 2L O2 to maintain sats, sounds wet, but in no distress. Sodium has increased incrementally to 117 this afternoon. PICC line in good position, c/d/i on exam.   I did get his brother on the phone. We spoke at some length about his recent decline since his father passed. His mother and other brother have also died previously. He's not been taking his medications prior to arrival and seemed like he had given up on life per his brother. I discussed his multiorgan failure and severe, potentially life threatening metabolic derangements despite maximal medical therapy. I worry that he may not survive the next 12-24 hours, particularly were he to have progressive hypotension and/or an aspiration event or develop respiratory distress.   We agreed to give midodrine, albumin, and continue serial labs and hypertonic saline. tonight. In the event that he does not respond to these measures, we would institute full comfort measures, specifically morphine infusion and prn ativan. He is an established DNR. Would not initiate vasopressors or provide invasive ventilatory support. He is aware that I fear his prognosis is measured in hours, though wishes to continue measures as we are doing for now.  CRITICAL CARE Performed by: Tyrone Nine   Total critical care time: 47 minutes  Critical care time was exclusive of separately billable procedures and treating other patients.  Critical care was necessary to treat or prevent imminent or life-threatening deterioration.  Critical care was time spent personally by me on the following activities: development of treatment plan with patient and/or  surrogate as well as nursing, discussions with consultants, evaluation of patient's response to treatment, examination of patient, obtaining history from patient or surrogate, ordering and performing treatments and interventions, ordering and review of laboratory studies, ordering and review of radiographic studies, pulse oximetry and re-evaluation of patient's condition.   Hazeline Junker, MD 06/25/2023 5:34 PM

## 2023-06-25 NOTE — Consult Note (Signed)
Consultation Note Date: 06/25/2023   Patient Name: Rakesh Shuford  DOB: July 30, 1975  MRN: [id number]  Age / Sex: 48 y.o., male  PCP: Reather Converse, PA-C Referring Physician: Tyrone Nine, MD  Reason for Consultation: Establishing goals of care  HPI/Patient Profile: 48 y.o. male  with past medical history of Elita Boone cirrhosis status post TIPS, deemed to not be a candidate for transplant, GERD, macrocytic anemia, history of cellulitis of right lower extremity, failure to thrive in adult, anxiety, HTN, admitted on 06/19/2023 with acute metabolic encephalopathy multifactorial, situational suicidal ideation and depression cleared by psychiatry.   Clinical Assessment and Goals of Care: I have reviewed medical records including EPIC notes, labs and imaging, received report from RN, assessed the patient.  Mr. Brookes, Pick, is lying quietly in bed.  He appears acutely/chronically ill and frail.  He greets me, making and mostly keeping eye contact.  He is alert, oriented to person and situation only.  I do believe that he can make his basic needs known.  There is no family at bedside at this time.  Bedside nursing staff is present attending to needs.   Mr. Bobst tells me tearfully that he would like to return to his own home.  We talk about his acute health concerns, but he seems to have limited insight.  He states that he can care for himself.  I ask if he would want to go home even if it means he passes away without help.  He states that he does not care, it is where he wants to be when he dies.  He states that his father died in their home.  He tells me that he has a brother, Onalee Hua and that I should get his phone number from cousin Lupita Leash.  He shares he had another brother who died from drug use.  Call to cousin, Clemetine Marker.  Lupita Leash shares that Damare has been living on his own since his father died.  She shares  that either she or Diaz's brother Onalee Hua will get groceries for [employer]  Cinsere manages his finances and that he will take his bills to his bank, hand to the bills over to the teller and ask for money orders.  She shares that they have found 15 $100 bills just thrown around his home.   Lupita Leash gives brother David's number as [phone].  Lupita Leash tells me that Onalee Hua is a truck driver and has not been able to come home for approximately 3 weeks.   Call to brother, Lovis Calisto, to discuss diagnosis prognosis, GOC, EOL wishes, disposition and options. I introduced Palliative Medicine as specialized medical care for people living with serious illness. It focuses on providing relief from the symptoms and stress of a serious illness. The goal is to improve quality of life for both the patient and the family.  We discussed a brief life review of the patient. Loretta had hydrocephalus as a child.  He has never married and has no children.  Onalee Hua verifies that their father died in  July or August of this year and Blayze has gone downhill since.  He shares that sometimes Deniel wants everything done and then other times states he is ready to die and be with her father.   We then focused on their current illness.  We talked about Galen's acute and chronic health concerns and the treatment plan.  We talked about time for outcomes.  The natural disease trajectory and expectations at EOL were discussed.  We talk about versus seemingly inability to care for himself.  Onalee Hua shares that his brother is not taking his medications appropriately, and cannot manage his finances and IADLs without help.  We talk about possible need for guardianship.  Onalee Hua shares that Thelma Barge, who is caring for their deceased brothers 2 teenage children has offered for Jonnie to live in her home but he declines.   Advanced directives, concepts specific to code status, artifical feeding and hydration, and rehospitalization were considered and discussed.   We talked about the concept of "treat the treatable, but allow a natural passing".  Hospice and Palliative Care services outpatient were briefly discussed.  Duran would not be a candidate for hospice care as long as he is taking Xifaxan.   Discussed the importance of continued conversation with family and the medical providers regarding overall plan of care and treatment options, ensuring decisions are within the context of the patient's values and GOCs.  Questions and concerns were addressed. The family was encouraged to call with questions or concerns.  PMT will continue to support holistically.  Conference with attending, bedside nursing staff, transition of care team related to patient condition, needs, goals of care, disposition.   HCPOA  NEXT OF KIN -brother, Eyal Tantillo.  Nicki never married and has no children.  His first contact is his cousin, Clemetine Marker who lives locally because his brother Onalee Hua is a long-distance [employer] architect.    SUMMARY OF RECOMMENDATIONS   At this point continue to treat the treatable but no CPR or intubation. Time for outcomes. Requesting DSS guardianship evaluation   Code Status/Advance Care Planning: DNR -CODE STATUS discussed with brother as Cheron is disoriented.   Symptom Management:  Per hospitalist, no additional needs at this time.  Palliative Prophylaxis:  Frequent Pain Assessment and Oral Care  Additional Recommendations (Limitations, Scope, Preferences): Continue to treatment no CPR or intubation  Psycho-social/Spiritual:  Desire for further Chaplaincy support:no Additional Recommendations: Caregiving  Support/Resources and Education on Hospice  Prognosis:  Unable to determine, guarded at this point.  Weeks would not be surprising based on acuity of condition.  Discharge Planning: To Be Determined      Primary Diagnoses: Present on Admission:  Acute metabolic encephalopathy  Cellulitis of right lower extremity  Macrocytic  anemia  Thrombocytopenia (HCC)  GERD (gastroesophageal reflux disease)   I have reviewed the medical record, interviewed the patient and family, and examined the patient. The following aspects are pertinent.  Past Medical History:  Diagnosis Date   Anxiety 08/2022   Cirrhosis (HCC)    CKD (chronic kidney disease) stage 2, GFR 60-89 ml/min 05/24/2022   stage 3a   Depression 06/2022   GERD (gastroesophageal reflux disease)    H/O colonoscopy    H/O endoscopy    Headache    Hypertension    Liver cirrhosis (HCC)    Neuropathy    OSA (obstructive sleep apnea)    not currently using CPAP   S/P abdominal paracentesis    Social History   Socioeconomic History  Marital status: Single    Spouse name: Not on file   Number of children: Not on file   Years of education: Not on file   Highest education level: Not on file  Occupational History   Occupation: umemployed  Tobacco Use   Smoking status: Never    Passive exposure: Current   Smokeless tobacco: Never  Vaping Use   Vaping status: Never Used  Substance and Sexual Activity   Alcohol use: Not Currently   Drug use: Never   Sexual activity: Not Currently  Other Topics Concern   Not on file  Social History Narrative   Not on file   Social Drivers of Health   Financial Resource Strain: Not on file  Food Insecurity: No Food Insecurity (06/20/2023)   Hunger Vital Sign    Worried About Running Out of Food in the Last Year: Never true    Ran Out of Food in the Last Year: Never true  Transportation Needs: Unmet Transportation Needs (06/20/2023)   PRAPARE - Administrator, Civil Service (Medical): Yes    Lack of Transportation (Non-Medical): Yes  Physical Activity: Not on file  Stress: Not on file  Social Connections: Not on file   Family History  Problem Relation Age of Onset   Brain cancer Mother    Diabetes Mother    Diabetes Father    Pulmonary embolism Brother    Liver disease Neg Hx    Scheduled  Meds:  Chlorhexidine Gluconate Cloth  6 each Topical Daily   doxycycline  100 mg Oral Q12H   enoxaparin (LOVENOX) injection  40 mg Subcutaneous QHS   feeding supplement  237 mL Oral TID BM   lactulose  20 g Oral TID   multivitamin with minerals  1 tablet Oral Daily   nystatin  5 mL Oral QID   pantoprazole  40 mg Oral BID AC   rifaximin  550 mg Oral BID   tamsulosin  0.4 mg Oral Daily   Continuous Infusions:  sodium chloride (hypertonic) 30 mL/hr at 06/25/23 0850   PRN Meds:.acetaminophen, hydrOXYzine, melatonin, ondansetron **OR** ondansetron (ZOFRAN) IV Medications Prior to Admission:  Prior to Admission medications   Medication Sig Start Date End Date Taking? Authorizing Provider  Cholecalciferol (VITAMIN D3) 125 MCG (5000 UT) TABS Take 5,000 Units by mouth in the morning. 06/17/19  Yes [provider]  cyclobenzaprine (FLEXERIL) 10 MG tablet Take 10 mg by mouth 3 (three) times daily as needed for muscle spasms. 09/29/20  Yes [provider]  dicyclomine (BENTYL) 10 MG capsule TAKE 1 CAPSULE BY MOUTH DAILY BEFORE BREAKFAST Patient taking differently: Take 10 mg by mouth in the morning. 03/15/23  Yes Letta Median, PA-C  DULoxetine (CYMBALTA) 60 MG capsule Take 120 mg by mouth daily. 05/03/22  Yes [provider]  FEROSUL 325 (65 Fe) MG tablet Take 487.5 mg by mouth daily with breakfast. 09/26/22  Yes [provider]  furosemide (LASIX) 40 MG tablet Take 20 mg by mouth 2 (two) times daily. 01/23/22  Yes [provider]  gabapentin (NEURONTIN) 300 MG capsule Take 300 mg by mouth 3 (three) times daily.   Yes [provider]  lactulose (CHRONULAC) 10 GM/15ML solution Take 60 mLs (40 g total) by mouth 3 (three) times daily. Titrate to goal of 3-4 BMs daily. 09/18/22  Yes Letta Median, PA-C  levOCARNitine (CARNITOR) 330 MG tablet Take 330 mg by mouth 3 (three) times daily. 09/01/20  Yes [provider]  ondansetron (ZOFRAN) 4  MG tablet TAKE 1 TABLET BY MOUTH THREE TIMES DAILY BEFORE MEALS 12/19/22  Yes [name] S, PA-C  pantoprazole (PROTONIX) 40 MG tablet Take 1 tablet (40 mg total) by mouth 2 (two) times daily before a meal. 01/07/23  Yes Clearance Coots, Kristen S, PA-C  spironolactone (ALDACTONE) 100 MG tablet Take 100 mg by mouth 2 (two) times daily.   Yes [provider]  XIFAXAN 550 MG TABS tablet TAKE 1 TABLET BY MOUTH TWICE DAILY 09/11/22  Yes Letta Median, PA-C  Zinc 50 MG TABS Take 50 mg by mouth in the morning.   Yes [provider]   Allergies  Allergen Reactions   Chlorthalidone     Severe headaches    Metoprolol     Severe headaches    Review of Systems  Unable to perform ROS: Mental status change    Physical Exam Vitals and nursing note reviewed.  Constitutional:      General: He is in acute distress.     Appearance: He is ill-appearing.  HENT:     Mouth/Throat:     Mouth: Mucous membranes are dry.  Cardiovascular:     Rate and Rhythm: Normal rate.  Pulmonary:     Effort: Pulmonary effort is normal. No respiratory distress.  Abdominal:     General: There is distension.  Musculoskeletal:        General: Swelling present.  Skin:    General: Skin is warm and dry.     Findings: Bruising present.  Neurological:     Mental Status: He is alert.     Comments: Oriented to self and situation only  Psychiatric:     Comments: Agitated at times    Vital Signs: BP 101/60 (BP Location: Right Arm)   Pulse 91   Temp 98 F (36.7 C) (Oral)   Resp (!) 21   Ht 5\' 8"  (1.727 m)   Wt 86.6 kg   SpO2 100%   BMI 29.03 kg/m  Pain Scale: 0-10   Pain Score: 3    SpO2: SpO2: 100 % O2 Device:SpO2: 100 % O2 Flow Rate: .   IO: Intake/output summary:  Intake/Output Summary (Last 24 hours) at 06/25/2023 0913 Last data filed at 06/25/2023 0600 Gross per 24 hour  Intake 960 ml  Output 300 ml  Net 660 ml    LBM: Last BM Date : 06/24/23 Baseline Weight: Weight: 97 kg Most  recent weight: Weight: 86.6 kg     Palliative Assessment/Data:     Time In: 1130 Time Out: 1215 Time Total: 75 minutes  Greater than 50%  of this time was spent counseling and coordinating care related to the above assessment and plan.  Signed by: Katheran Awe, NP   Please contact Palliative Medicine Team phone at [phone] for questions and concerns.  For individual provider: See Loretha Stapler

## 2023-06-25 NOTE — Progress Notes (Signed)
Pt transported to ICU via bed. Pt awake, oriented to person and place but rambling conversation, hallucinating. Bedside report given to Norva Pavlov, RN.

## 2023-06-25 NOTE — Consult Note (Signed)
Reason for Consult: Hyponatremia Referring Physician: Jarvis Newcomer, MD  Alejandro Porter is an 48 y.o. male with a PMH significant for NASH cirrhosis, portal HTN s/p TIPS, GERD, thrombocytopenia, FTT, iron deficiency anemia, OSA, and anxiety/depression who presented to [facility] ED on 06/19/23 with AMS, worsening depression and suicidal ideations.  In the ED, VSS, labs notable for WBC 14.7, Hgb 11.1, Na 130, BUN 42, Cr 1.35, alb 2.4, AST 82, ALT 37, Alk phos 153, salicylate and acetaminophen were normal.  Ammonia 71.  He was admitted for further evaluation and management.  He was started on lasix 06/22/23 and his sodium dropped from 124 on 06/23/23 to 119 on 06/24/23 and 113 early this morning.  On call Nephrology recommended transfer to ICU and to start 3% saline and we were consulted to further evaluate and manage his acute on chronic hyponatremia.  The trend in Scr is seen below.  UNa <10, Uosm 813, Sosm 277 on 06/22/23.  Trend in Serum Sodium: Sodium  Date/Time Value Ref Range Status  06/25/2023 05:07 AM 113 (LL) 135 - 145 mmol/L Final  06/24/2023 07:24 AM 119 (LL) 135 - 145 mmol/L Final  06/23/2023 04:51 AM 124 (L) 135 - 145 mmol/L Final  06/22/2023 06:37 PM 123 (L) 135 - 145 mmol/L Final  06/22/2023 01:41 PM 122 (L) 135 - 145 mmol/L Final  06/22/2023 05:38 AM 124 (L) 135 - 145 mmol/L Final  06/21/2023 07:50 AM 130 (L) 135 - 145 mmol/L Final  06/20/2023 04:27 AM 132 (L) 135 - 145 mmol/L Final  06/19/2023 08:11 PM 130 (L) 135 - 145 mmol/L Final  04/14/2023 03:57 AM 126 (L) 135 - 145 mmol/L Final  04/13/2023 05:10 AM 128 (L) 135 - 145 mmol/L Final  04/12/2023 05:56 AM 127 (L) 135 - 145 mmol/L Final  04/11/2023 02:44 AM 128 (L) 135 - 145 mmol/L Final  04/10/2023 02:40 PM 124 (L) 135 - 145 mmol/L Final  01/18/2023 09:46 AM 130 (L) 135 - 145 mmol/L Final  01/18/2023 09:35 AM 130 (L) 135 - 145 mmol/L Final  12/06/2022 01:38 PM 131 (L) 135 - 146 mmol/L Final  11/29/2022 10:08 AM 133 (L) 135 - 146 mmol/L  Final  10/26/2022 02:48 PM 131 (L) 135 - 146 mmol/L Final  10/13/2022 09:27 AM 130 (L) 135 - 146 mmol/L Final  09/05/2022 07:39 AM 124 (L) 135 - 145 mmol/L Final  09/04/2022 05:59 AM 124 (L) 135 - 145 mmol/L Final  08/03/2022 11:02 AM 125 (L) 135 - 145 mmol/L Final  07/17/2022 11:07 AM 124 (L) 135 - 146 mmol/L Final  07/11/2022 09:54 AM 126 (L) 135 - 146 mmol/L Final  05/31/2022 11:10 AM 126 (L) 135 - 146 mmol/L Final  05/22/2022 02:14 PM 127 (L) 134 - 144 mmol/L Final  05/15/2022 08:01 AM 126 (L) 135 - 145 mmol/L Final  05/10/2022 11:05 AM 130 (L) 135 - 146 mmol/L Final  04/05/2022 08:44 AM 130 (L) 135 - 146 mmol/L Final  03/17/2022 09:50 AM 126 (L) 135 - 146 mmol/L Final  03/15/2022 10:53 AM 125 (L) 135 - 146 mmol/L Final  01/13/2022 08:03 AM 124 (L) 135 - 145 mmol/L Final  11/29/2021 11:14 AM 124 (L) 135 - 145 mmol/L Final  10/04/2021 12:18 PM 126 (L) 135 - 145 mmol/L Final  08/08/2021 01:02 PM 127 (L) 135 - 145 mmol/L Final  08/01/2021 12:23 PM 122 (L) 135 - 145 mmol/L Final  07/28/2021 09:10 AM 123 (L) 135 - 145 mmol/L Final  02/21/2021 11:28 AM 122 (L)  135 - 145 mmol/L Final  02/03/2021 10:43 AM 124 (L) 135 - 145 mmol/L Final  01/27/2021 09:43 AM 127 (L) 135 - 145 mmol/L Final  01/19/2021 12:19 PM 124 (L) 135 - 145 mmol/L Final  12/15/2020 09:30 AM 122 (L) 135 - 145 mmol/L Final  12/15/2020 09:18 AM 122 (L) 135 - 145 mmol/L Final  12/13/2020 09:13 AM 122 (L) 135 - 145 mmol/L Final  12/07/2020 11:24 AM 125 (L) 135 - 145 mmol/L Final  10/18/2020 09:33 AM 129 (L) 135 - 145 mmol/L Final  10/11/2020 06:21 PM 127 (L) 135 - 145 mmol/L Final  09/24/2020 10:00 AM 124 (L) 135 - 145 mmol/L Final  09/16/2020 10:02 AM 129 (L) 135 - 145 mmol/L Final  09/15/2020 04:34 AM 132 (L) 135 - 145 mmol/L Final  09/14/2020 04:11 PM 128 (L) 135 - 145 mmol/L Final  08/06/2020 01:44 PM 128 (L) 135 - 145 mmol/L Final    PMH:   Past Medical History:  Diagnosis Date   Anxiety 08/2022   Cirrhosis  (HCC)    CKD (chronic kidney disease) stage 2, GFR 60-89 ml/min 05/24/2022   stage 3a   Depression 06/2022   GERD (gastroesophageal reflux disease)    H/O colonoscopy    H/O endoscopy    Headache    Hypertension    Liver cirrhosis (HCC)    Neuropathy    OSA (obstructive sleep apnea)    not currently using CPAP   S/P abdominal paracentesis     PSH:   Past Surgical History:  Procedure Laterality Date   BIOPSY  09/12/2019   Procedure: BIOPSY;  Surgeon: Corbin Ade, MD;  [facility];  Service: Endoscopy;;   COLONOSCOPY N/A 09/18/2019   Dr. Darrick Penna: 12 colon polyps removed, multiple simple adenomas and some inflammatory polyps, diverticulosis, external and internal hemorrhoids.  Next colonoscopy in 3 years.   COLONOSCOPY WITH PROPOFOL N/A 03/24/2022   Procedure: COLONOSCOPY WITH PROPOFOL;  Surgeon: Lanelle Bal, DO;  [facility]: [facility];  Service: Endoscopy;  Laterality: N/A;  1:30pm, asa 3   ESOPHAGEAL BANDING N/A 09/12/2019   Procedure: ESOPHAGEAL BANDING;  Surgeon: Corbin Ade, MD;  [facility];  Service: Endoscopy;  Laterality: N/A;   ESOPHAGOGASTRODUODENOSCOPY N/A 09/12/2019   Dr. Jena Gauss: Mild erosive reflux esophagitis.  Schatzki ring status post dilation.  Small grade 1/grade 2 esophageal varices, portal hypertensive gastropathy, hyperplastic gastric polyp, gastric erosions with chronic inactive gastritis on biopsies, no H. pylori.   ESOPHAGOGASTRODUODENOSCOPY (EGD) WITH PROPOFOL N/A 12/16/2020   two short columns of no more than Grade 2 esophageal varices, appearing innocent. Grade 1 varices not apparent today. Multiple large posterior body and antral hyperplastic, hemorrhagic appearing polyps, larges approximately 2.5 cm pedunculated. Portal gastropathy. Normal duodenum. Polypectomy with clips placed. EGD in 18 months.   ESOPHAGOGASTRODUODENOSCOPY (EGD) WITH PROPOFOL N/A 03/24/2022   Procedure: ESOPHAGOGASTRODUODENOSCOPY (EGD) WITH PROPOFOL;   Surgeon: Lanelle Bal, DO;  [facility];  Service: Endoscopy;  Laterality: N/A;   IR ANGIOGRAM SELECTIVE EACH ADDITIONAL VESSEL  09/04/2022   IR EMBO VENOUS NOT HEMORR HEMANG  INC GUIDE ROADMAPPING  09/04/2022   IR INTRAVASCULAR ULTRASOUND NON CORONARY  09/04/2022   IR PARACENTESIS  09/04/2022   IR RADIOLOGIST EVAL & MGMT  07/19/2022   IR RADIOLOGIST EVAL & MGMT  08/14/2022   IR RADIOLOGIST EVAL & MGMT  09/29/2022   IR RADIOLOGIST EVAL & MGMT  10/31/2022   IR RADIOLOGIST EVAL & MGMT  11/20/2022  IR RADIOLOGIST EVAL & MGMT  02/21/2023   IR TIPS  09/04/2022   IR US GUIDE VASC ACCESS RIGHT  09/04/2022   POLYPECTOMY  09/18/2019   Procedure: POLYPECTOMY;  Surgeon: West Bali, MD;  [facility];  Service: Endoscopy;;   POLYPECTOMY  12/16/2020   Procedure: POLYPECTOMY;  Surgeon: Corbin Ade, MD;  [facility];  Service: Endoscopy;;  gastric   POLYPECTOMY  03/24/2022   Procedure: POLYPECTOMY;  Surgeon: Lanelle Bal, DO;  [facility];  Service: Endoscopy;;   RADIOLOGY WITH ANESTHESIA N/A 09/04/2022   Procedure: TIPS;  Surgeon: Bennie Dallas, MD;  Location: [facility];  Service: Radiology;  Laterality: N/A;    Allergies:  Allergies  Allergen Reactions   Chlorthalidone     Severe headaches    Metoprolol     Severe headaches     Medications:   Prior to Admission medications   Medication Sig Start Date End Date Taking? Authorizing Provider  Cholecalciferol (VITAMIN D3) 125 MCG (5000 UT) TABS Take 5,000 Units by mouth in the morning. 06/17/19  Yes [provider]  cyclobenzaprine (FLEXERIL) 10 MG tablet Take 10 mg by mouth 3 (three) times daily as needed for muscle spasms. 09/29/20  Yes [provider]  dicyclomine (BENTYL) 10 MG capsule TAKE 1 CAPSULE BY MOUTH DAILY BEFORE BREAKFAST Patient taking differently: Take 10 mg by mouth in the morning. 03/15/23  Yes Letta Median, PA-C  DULoxetine (CYMBALTA) 60 MG capsule Take 120 mg  by mouth daily. 05/03/22  Yes [provider]  FEROSUL 325 (65 Fe) MG tablet Take 487.5 mg by mouth daily with breakfast. 09/26/22  Yes [provider]  furosemide (LASIX) 40 MG tablet Take 20 mg by mouth 2 (two) times daily. 01/23/22  Yes [provider]  gabapentin (NEURONTIN) 300 MG capsule Take 300 mg by mouth 3 (three) times daily.   Yes [provider]  lactulose (CHRONULAC) 10 GM/15ML solution Take 60 mLs (40 g total) by mouth 3 (three) times daily. Titrate to goal of 3-4 BMs daily. 09/18/22  Yes Letta Median, PA-C  levOCARNitine (CARNITOR) 330 MG tablet Take 330 mg by mouth 3 (three) times daily. 09/01/20  Yes [provider]  ondansetron (ZOFRAN) 4 MG tablet TAKE 1 TABLET BY MOUTH THREE TIMES DAILY BEFORE MEALS 12/19/22  Yes [name] S, PA-C  pantoprazole (PROTONIX) 40 MG tablet Take 1 tablet (40 mg total) by mouth 2 (two) times daily before a meal. 01/07/23  Yes Clearance Coots, Kristen S, PA-C  spironolactone (ALDACTONE) 100 MG tablet Take 100 mg by mouth 2 (two) times daily.   Yes [provider]  XIFAXAN 550 MG TABS tablet TAKE 1 TABLET BY MOUTH TWICE DAILY 09/11/22  Yes Letta Median, PA-C  Zinc 50 MG TABS Take 50 mg by mouth in the morning.   Yes [provider]    Discontinued Meds:   Medications Discontinued During This Encounter  Medication Reason   vancomycin (VANCOCIN) IVPB 1000 mg/200 mL premix Dose change   oxyCODONE (OXY IR/ROXICODONE) 5 MG immediate release tablet Completed Course   hydrOXYzine (ATARAX) 25 MG tablet Patient Preference   SODIUM FLUORIDE 5000 PPM 1.1 % GEL dental gel Patient Preference   0.9 %  sodium chloride infusion    cefTRIAXone (ROCEPHIN) 1 g in sodium chloride 0.9 % 100 mL IVPB    feeding supplement (ENSURE ENLIVE / ENSURE PLUS) liquid 237 mL    sertraline (ZOLOFT) 20  MG/ML concentrated solution 25 mg    sertraline (ZOLOFT) tablet 25 mg    lactulose (CHRONULAC) 10 GM/15ML solution 40 g     enoxaparin (LOVENOX) injection 40 mg    furosemide (LASIX) tablet 40 mg    melatonin tablet 6 mg    furosemide (LASIX) tablet 40 mg     Social History:  reports that he has never smoked. He has been exposed to tobacco smoke. He has never used smokeless tobacco. He reports that he does not currently use alcohol. He reports that he does not use drugs.  Family History:   Family History  Problem Relation Age of Onset   Brain cancer Mother    Diabetes Mother    Diabetes Father    Pulmonary embolism Brother    Liver disease Neg Hx     Review of systems not obtained due to patient factors.  Blood pressure 101/60, pulse 91, temperature 97.7 F (36.5 C), temperature source Oral, resp. rate (!) 21, height 5\' 8"  (1.727 m), weight 86.6 kg, SpO2 100%. General appearance: confused, lethargic, but in NAD Head: Normocephalic, without obvious abnormality, atraumatic Resp: clear to auscultation bilaterally Cardio: regular rate and rhythm, S1, S2 normal, no murmur, click, rub or gallop GI: soft, non-tender; bowel sounds normal; no masses,  no organomegaly Extremities: edema 3+ edema of BLE  Labs: Basic Metabolic Panel: Recent Labs  Lab 06/19/23 2011 06/20/23 0427 06/21/23 0750 06/22/23 0538 06/22/23 1341 06/22/23 1837 06/23/23 0451 06/24/23 0724 06/25/23 0507  NA 130* 132* 130* 124* 122* 123* 124* 119* 113*  K 4.0 3.9 3.8 3.7 3.8 3.7 4.1 4.2 4.5  CL 100 102 102 98 98 95* 95* 90* 87*  CO2 17* 22 22 21* 19* 19* 20* 20* 19*  GLUCOSE 142* 116* 133* 138* 135* 192* 132* 128* 132*  BUN 42* 40* 29* 26* 25* 23* 24* 26* 33*  CREATININE 1.35* 1.02 0.84 0.66 0.73 0.76 0.63 0.66 0.78  ALBUMIN 2.4* 2.3* 2.3*  --   --   --  2.2* 2.4*  --   CALCIUM 8.9 8.9 9.0 9.0 8.9 8.9 8.5* 8.7* 8.5*  PHOS  --  3.4  --   --   --   --   --   --   --    Liver Function Tests: Recent Labs  Lab 06/21/23 0750 06/23/23 0451 06/24/23 0724  AST 84* 102* 114*  ALT 40 46* 54*  ALKPHOS 163* 183* 240*  BILITOT  5.1* 4.0* 5.3*  PROT 5.5* 5.4* 5.9*  ALBUMIN 2.3* 2.2* 2.4*   No results for input(s): "LIPASE", "AMYLASE" in the last 168 hours. Recent Labs  Lab 06/19/23 2011  AMMONIA 71*   CBC: Recent Labs  Lab 06/19/23 2011 06/20/23 0427 06/22/23 0538 06/23/23 0451 06/24/23 0724 06/25/23 0507  WBC 14.7*   < > 10.0 8.8 10.1 7.3  NEUTROABS 12.0*  --   --   --   --   --   HGB 11.1*   < > 12.1* 11.6* 12.6* 11.9*  HCT 33.1*   < > 35.0* 34.5* 35.3* 33.3*  MCV 104.7*   < > 102.3* 102.1* 100.0 100.3*  PLT 102*   < > 83* 47* 74* 62*   < > = values in this interval not displayed.   PT/INR: @labrcntip (inr:5) Cardiac Enzymes: No results for input(s): "CKTOTAL", "CKMB", "CKMBINDEX", "TROPONINI" in the last 168 hours. CBG: Recent Labs  Lab 06/19/23 1938  GLUCAP 172*    Iron Studies: No results for input(s): "IRON", "  TIBC", "TRANSFERRIN", "FERRITIN" in the last 168 hours.  Xrays/Other Studies: DG Chest 2 View Result Date: 06/24/2023 CLINICAL DATA:  Shortness of breath, chest pain. EXAM: CHEST - 2 VIEW COMPARISON:  Chest radiograph dated 06/19/2023. FINDINGS: The heart size and mediastinal contours are within normal limits. Moderate bilateral perihilar interstitial and airspace opacities. No pleural effusion or pneumothorax. The visualized skeletal structures are unremarkable. IMPRESSION: Moderate bilateral perihilar interstitial and airspace opacities may represent pulmonary edema or atypical infection. Electronically Signed   By: Romona Curls M.D.   On: 06/24/2023 12:58     Assessment/Plan:  Acute on chronic hyponatremia - urine studies consistent with cause due to intravascular volume depletion in setting of anasarca from cirrhosis and use of diuretics.  Agree with stopping Lasix and starting 3% saline. Unfortunately, he just got an IV and started on hypertonic 10 minutes ago.  Will need to follow sodium levels closely while on 3%.   Cirrhosis with elevated LFT's - continue to follow  closely Metabolic encephalopathy - multifactorial with elevated ammonia, as well as hyponatremia, and possible infection.  Negative UDS.  Per primary service.  Continue with lactulose and rifaximin. Suicidal ideation/depression - per primary. Acute urinary retention - s/p foley catheter.  Continue with tamsulosin RLE cellulitis - started on cefadroxil per primary Hypoalbuminemia - due to cirrhosis.  Use IV albumin prn.  Continue with protein supplementation. Disposition - prognosis guarded at this time.   Julien Nordmann Violette Morneault 06/25/2023, 8:33 AM

## 2023-06-25 NOTE — Progress Notes (Signed)
PT Cancellation Note  Patient Details Name: Alejandro Porter MRN: [id number] DOB: 22-Dec-1975   Cancelled Treatment:    Reason Eval/Treat Not Completed: Medical issues which prohibited therapy.  Patient transferred to a higher level of care and will need new PT consult to resume therapy when patient is medically stable.  Thank you.   8:39 AM, 06/25/23 Ocie Bob, MPT Physical Therapist with [facility] 336 [phone] office [phone] mobile phone

## 2023-06-25 NOTE — Progress Notes (Signed)
REport given to day RN Alyssa.  Pt IV infiltrated in right hand. Alyssa will address

## 2023-06-25 NOTE — Progress Notes (Signed)
Patient has been very tearful this shift. He has cried out most of shift. He has also been more confused. He told me that his mother, father and brother all died two weeks ago in the hospital from a blood clot that was put in them. I asked him to use his "inside voice" because people were trying to sleep and this seemed to quiet him down for a short time. He feels like we are holding him hostage here. I told him that he is here so that we can treat him and help him to feel more comfortable.

## 2023-06-25 NOTE — Significant Event (Signed)
Update:  Has acute worsening hyponatremia, severe, mild symptomatic with encephalopathy, last sOsm 277 on 1/17, and uOsm 516, uNa < 10 yesterday. I spoke with Nephrology on Call Dr. Thedore Mins. Recommends initiation of hypertonic saline at 30 ml/hr to start, Na check q 4 hr. Will move to ICU level of care. Nephrology will see later this morning.   Dolly Rias, MD  [facility]

## 2023-06-25 NOTE — Progress Notes (Signed)
Peripherally Inserted Central Catheter Placement  The IV Nurse has discussed with the patient and/or persons authorized to consent for the patient, the purpose of this procedure and the potential benefits and risks involved with this procedure.  The benefits include less needle sticks, lab draws from the catheter, and the patient may be discharged home with the catheter. Risks include, but not limited to, infection, bleeding, blood clot (thrombus formation), and puncture of an artery; nerve damage and irregular heartbeat and possibility to perform a PICC exchange if needed/ordered by physician.  Alternatives to this procedure were also discussed.  Bard Power PICC patient education guide, fact sheet on infection prevention and patient information card has been provided to patient /or left at bedside.    PICC Placement Documentation  PICC Double Lumen 06/25/23 Right Basilic 37 cm 0 cm (Active)  Indication for Insertion or Continuance of Line Poor Vasculature-patient has had multiple peripheral attempts or PIVs lasting less than 24 hours 06/25/23 1553  Exposed Catheter (cm) 0 cm 06/25/23 1553  Site Assessment Clean, Dry, Intact 06/25/23 1553  Lumen #1 Status Flushed;Blood return noted;Saline locked 06/25/23 1553  Lumen #2 Status Flushed;Blood return noted;Saline locked 06/25/23 1553  Dressing Type Transparent 06/25/23 1553  Dressing Status Antimicrobial disc/dressing in place 06/25/23 1553  Line Care Connections checked and tightened 06/25/23 1553  Line Adjustment (NICU/IV Team Only) No 06/25/23 1553  Dressing Intervention New dressing 06/25/23 1553  Dressing Change Due 07/02/23 06/25/23 1553       Audrie Gallus 06/25/2023, 3:54 PM

## 2023-06-26 DIAGNOSIS — E8809 Other disorders of plasma-protein metabolism, not elsewhere classified: Secondary | ICD-10-CM | POA: Diagnosis not present

## 2023-06-26 DIAGNOSIS — G9341 Metabolic encephalopathy: Secondary | ICD-10-CM | POA: Diagnosis not present

## 2023-06-26 DIAGNOSIS — Z515 Encounter for palliative care: Secondary | ICD-10-CM | POA: Diagnosis not present

## 2023-06-26 DIAGNOSIS — R627 Adult failure to thrive: Secondary | ICD-10-CM | POA: Diagnosis not present

## 2023-06-26 LAB — CBC
HCT: 29.6 % — ABNORMAL LOW (ref 39.0–52.0)
Hemoglobin: 10.5 g/dL — ABNORMAL LOW (ref 13.0–17.0)
MCH: 36.1 pg — ABNORMAL HIGH (ref 26.0–34.0)
MCHC: 35.5 g/dL (ref 30.0–36.0)
MCV: 101.7 fL — ABNORMAL HIGH (ref 80.0–100.0)
Platelets: 42 10*3/uL — ABNORMAL LOW (ref 150–400)
RBC: 2.91 MIL/uL — ABNORMAL LOW (ref 4.22–5.81)
RDW: 17.6 % — ABNORMAL HIGH (ref 11.5–15.5)
WBC: 8.4 10*3/uL (ref 4.0–10.5)
nRBC: 0 % (ref 0.0–0.2)

## 2023-06-26 LAB — BASIC METABOLIC PANEL
Anion gap: 9 (ref 5–15)
BUN: 39 mg/dL — ABNORMAL HIGH (ref 6–20)
CO2: 20 mmol/L — ABNORMAL LOW (ref 22–32)
Calcium: 8.4 mg/dL — ABNORMAL LOW (ref 8.9–10.3)
Chloride: 92 mmol/L — ABNORMAL LOW (ref 98–111)
Creatinine, Ser: 0.9 mg/dL (ref 0.61–1.24)
GFR, Estimated: >60 mL/min (ref >60)
Glucose, Bld: 93 mg/dL (ref 70–99)
Potassium: 5 mmol/L (ref 3.5–5.1)
Sodium: 121 mmol/L — ABNORMAL LOW (ref 135–145)

## 2023-06-26 LAB — OSMOLALITY, URINE: Osmolality, Ur: 623 mosm/kg (ref 300–900)

## 2023-06-26 LAB — CYTOLOGY - NON PAP

## 2023-06-26 LAB — SODIUM
Sodium: 121 mmol/L — ABNORMAL LOW (ref 135–145)
Sodium: 123 mmol/L — ABNORMAL LOW (ref 135–145)

## 2023-06-26 MED ORDER — LORAZEPAM 2 MG/ML IJ SOLN
0.1000 mg | INTRAMUSCULAR | Status: DC | PRN
  Administered 2023-06-26 – 2023-06-28 (×8): 2 mg via INTRAVENOUS
  Administered 2023-06-29: 1 mg via INTRAVENOUS
  Administered 2023-07-01 – 2023-07-02 (×5): 2 mg via INTRAVENOUS
  Filled 2023-06-26 (×14): qty 1

## 2023-06-26 MED ORDER — BIOTENE DRY MOUTH MT LIQD: 15.0000 mL | OROMUCOSAL | Status: DC | PRN

## 2023-06-26 MED ORDER — GLYCOPYRROLATE 0.2 MG/ML IJ SOLN
0.2000 mg | INTRAMUSCULAR | Status: DC | PRN
  Administered 2023-06-26 – 2023-07-02 (×14): 0.2 mg via INTRAVENOUS
  Filled 2023-06-26 (×15): qty 1

## 2023-06-26 MED ORDER — GLYCOPYRROLATE 0.2 MG/ML IJ SOLN
0.2000 mg | INTRAMUSCULAR | Status: DC | PRN
  Administered 2023-07-01: 0.2 mg via SUBCUTANEOUS
  Filled 2023-06-26: qty 1

## 2023-06-26 MED ORDER — ONDANSETRON 4 MG PO TBDP: 4.0000 mg | ORAL_TABLET | Freq: Four times a day (QID) | ORAL | Status: DC | PRN

## 2023-06-26 MED ORDER — POLYVINYL ALCOHOL 1.4 % OP SOLN
1.0000 [drp] | Freq: Four times a day (QID) | OPHTHALMIC | Status: DC | PRN
  Administered 2023-06-28 – 2023-07-01 (×2): 1 [drp] via OPHTHALMIC
  Filled 2023-06-26: qty 15

## 2023-06-26 MED ORDER — GLYCOPYRROLATE 1 MG PO TABS: 1.0000 mg | ORAL_TABLET | ORAL | Status: DC | PRN

## 2023-06-26 MED ORDER — FENTANYL CITRATE PF 50 MCG/ML IJ SOSY
25.0000 ug | PREFILLED_SYRINGE | INTRAMUSCULAR | Status: DC | PRN
  Administered 2023-06-26 – 2023-07-02 (×14): 50 ug via INTRAVENOUS
  Filled 2023-06-26 (×15): qty 1

## 2023-06-26 MED ORDER — ACETAMINOPHEN 325 MG PO TABS: 650.0000 mg | ORAL_TABLET | Freq: Four times a day (QID) | ORAL | Status: DC | PRN

## 2023-06-26 MED ORDER — ONDANSETRON HCL 4 MG/2ML IJ SOLN: 4.0000 mg | Freq: Four times a day (QID) | INTRAMUSCULAR | Status: DC | PRN

## 2023-06-26 MED ORDER — ACETAMINOPHEN 650 MG RE SUPP: 650.0000 mg | Freq: Four times a day (QID) | RECTAL | Status: DC | PRN

## 2023-06-26 MED ORDER — FENTANYL CITRATE PF 50 MCG/ML IJ SOSY
25.0000 ug | PREFILLED_SYRINGE | INTRAMUSCULAR | Status: DC | PRN
  Administered 2023-06-26: 25 ug via INTRAVENOUS
  Filled 2023-06-26: qty 1

## 2023-06-26 MED ORDER — ALBUMIN HUMAN 25 % IV SOLN
25.0000 g | Freq: Four times a day (QID) | INTRAVENOUS | Status: DC
  Administered 2023-06-26: 25 g via INTRAVENOUS
  Filled 2023-06-26: qty 100

## 2023-06-26 NOTE — Plan of Care (Signed)

## 2023-06-26 NOTE — Progress Notes (Signed)
[facility] PROGRESS NOTE  Alejandro Porter (DOB: 1975/12/14) RUE:[id number] PCP: Reather Converse, PA-C  Brief Narrative: Alejandro Porter is a 48 y.o. male with a history of NASH cirrhosis s/p TIPS who presented to the ED on 06/19/2023 with suicidal thoughts at home. History limited due to encephalopathy. He was tachycardic, afebrile, WBC 14.7k. He was treated for infection, cellulitis, then UTI, increased lactulose and added rifaximin with improvement in mentation. He has, per his brother, a history of cognitive impairment to some degree, and had improved near his baseline. He has not had any evidence of ongoing suicidal ideation, psychiatry cleared him. Hospitalization complicated by urinary retention for which foley was placed, and later severe hyponatremia in the setting of progressive anasarca from decompensated cirrhosis. He was transferred to the ICU, PICC placed, hypertonic saline initiated with steady improvement in sodium level but overall continued clinical decline. Having bleeding due to coagulopathy treated with vitamin K. Palliative care has continued conversations with the patient and family. He is DNR, and hospital demise is anticipated.   Subjective: Worsening mentation this morning, having pain all over.   Objective: BP 127/80   Pulse 89   Temp (!) 96.4 F (35.8 C)   Resp (!) 21   Ht 5\' 8"  (1.727 m)   Wt 86.6 kg   SpO2 97%   BMI 29.03 kg/m   Gen: Ill appearing male  Pulm: Clear at bases, but upper airway secretions are not being managed, he's on supplemental oxygen with adequate SpO2 and normal effort but tachypneic  CV: RRR, no MRG. Stable severe anasarca. GI: Distended but soft without focal tenderness, umbilical hernia stable in size, not appreciably tender or indurated. +BS.  Neuro: Disoriented, not cooperative with exam.  Ext: Warm, dry, diffusely edematous  Assessment & Plan: Principal Problem:   Acute metabolic encephalopathy Active Problems:    Thrombocytopenia (HCC)   GERD (gastroesophageal reflux disease)   Macrocytic anemia   Generalized weakness   Cellulitis of right lower extremity   Failure to thrive in adult   Hypoalbuminemia due to protein-calorie malnutrition (HCC)   Hyperammonemia (HCC)   Transaminitis   AKI (acute kidney injury) (HCC)   Prolonged QT interval   History of cirrhosis   Protein-calorie malnutrition, severe  Goals of care: Pt is confirmed DNR and would not desire escalation of care further than our current situation. Palliative and medical teams have been in communication with family. We suspect his prognosis is limited to hours-days regardless of maximal medical therapy at this time. Will discuss further with family today. In the event of aspiration event or further decline, would not escalate and would start morphine infusion. Given poor level of alertness and suspected inability to manage secretions, will minimize oral medications.   Hyponatremia: Has this chronically related to cirrhosis but worsened after SSRI introduced, since stopped. Worsened with reintroduction of lasix as well.  - Improved on hypertonic saline, though underlying issue of intravascular depletion and extravascular overload due to decompensated cirrhosis remains. Guarded prognosis. Continue management per nephrology   Multifactorial metabolic encephalopathy: Hyperammonemia likely contributing, though infection not completely ruled out. Note initial concern for RLE cellulitis which is being treated with abx. CT head showed no acute intracranial abnormality. Negative UDS. - Level of orientation has returned to his putative baseline. Though he remains disoriented to year and with insight limitation which precludes his capacity to make the decision to leave the hospital against medical advice at this time.  - Limit sedating medications, though his delirium is  now limiting patient care, pulling IV's, giving ativan. Hopefully can transition to  comfort measures today.  - Continue high dose lactulose. Added rifaximin given persistent AMS and ammonia elevation.  - Insufficient fluid for diagnostic paracentesis 1/15, and was covered with antibiotics empirically to cover SBP in addition to his cellulitis as below.   Umbilical hernia, dating back to at least 2022 with complex fluid collection: ?seroma - D/w surgery, Dr. Lovell Sheehan, now s/p 90cc clear yellow fluid drained 1/17 by Dr. Deanne Coffer. Negative gram stain, cell counts not appearing overtly infected, negative culture. Suspect seroma.   Suicidal ideation, depression: This appears to have been situational and in the setting of encephalopathy. This has durably resolved. He has no thoughts of self harm at this time and no plans.  - Psychiatry consulted, started SSRI (since stopped due to hyponatremia), and prn hydroxyzine. No suicide precautions required, no psychiatric inpatient admission recommended.    Decompensated NASH cirrhosis: MELD score is 28 (19.6% 67-month mortality) - Appeared dehydrated, so diuretics held. Restarted due to volume overload but UNa low and intravascularly depleted so stopped lasix again.  - Coagulopathy noted. Tx w/vitamin K. - Given his advanced cirrhosis and failure to thrive with malnutrition, palliative care is consulted for additional support.   Acute urinary retention: Prostate grossly unremarkable on CT which also showed normal right kidney, nonobstructing 2mm stones in left kidney without hydronephrosis. Bladder wnl. Urinalysis with some RBCs, protein and WBCs, hyaline casts. Urine culture negative.  - Continue tamsulosin, foley  Iron deficiency, macrocytic anemia, thrombocytopenia: B12 and folic acid are replete. Suspect this is due to cirrhosis.  - Having lip bleeding, so have to stop lovenox VTE ppx. Use SCDs unless comfort measures.  GERD: Thickening of duodenum suggested by CT. Will keep this in mind without directed treatment at this time.  - Continue  PPI  Falls at home, deconditioning: Appears to be failing to thrive. CT revealed multiple bilateral rib fractures in stages of healing.  - PT/OT/RD evaluations appreciated.   AKI: Improved with supportive measures, holding diuretics. CrCl > 37ml/min.  Prolonged QT interval: at admission.  - Minimize provocative agents  RLE cellulitis:  - Nonpurulent, converted to cefadroxil, clinically much improved, Leukocytosis has resolved. Will stop abx.   Severe protein calorie malnutrition:  - Supplement protein as much as possible.   Recent inguinal hernia repair: Dec 2024 at [facility]. No current issues. ?etiology of dot of free air on CT.   Tyrone Nine, MD [facility] [email or web address] 06/26/2023, 10:40 AM

## 2023-06-26 NOTE — Significant Event (Signed)
Called to evaluate patient for pain control. 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 Conversations already ongoing about transition to full comfort if not recovering. At time of my evaluation he is altered, in mitts. But able to express that he is having significant pain, and wants to have pain medication. Will trial Fentanyl 25 mcg q 2 hr prn to see if this helps relieve his symptoms.   Dolly Rias, MD  [facility]

## 2023-06-26 NOTE — Progress Notes (Addendum)
Patient ID: Alejandro Porter, male   DOB: 05-10-1976, 48 y.o.   MRN: [id number] S: Pt became more agitated and confused last night and now wearing mittens.  Also developed worsening pain early this am and given fentanyl.  No longer able to communicate clearly as he did yesterday morning.  Palliative care consulted and now DNR and discussions of transitioning to comfort care ongoing with family. O:BP 127/79   Pulse 88   Temp (!) 97.2 F (36.2 C)   Resp 17   Ht 5\' 8"  (1.727 m)   Wt 86.6 kg   SpO2 97%   BMI 29.03 kg/m   Intake/Output Summary (Last 24 hours) at 06/26/2023 1004 Last data filed at 06/26/2023 0600 Gross per 24 hour  Intake 694.56 ml  Output 550 ml  Net 144.56 ml   Intake/Output: I/O last 3 completed shifts: In: 1174.6 [P.O.:480; I.V.:579.4; IV Piggyback:115.2] Out: 850 [Urine:850]  Intake/Output this shift:  No intake/output data recorded. Weight change:  Gen: critically ill-appearing, mouth crusted with dry blood and poor dentition CVS: RRR Resp: scattered rhonchi and transmitted upper airway sounds Abd: +BS, soft, NT/ND Ext: 2+ edema to sacrum  Recent Labs  Lab 06/19/23 2011 06/20/23 0427 06/21/23 0750 06/22/23 0538 06/22/23 1341 06/22/23 1837 06/23/23 0451 06/24/23 0724 06/25/23 0507 06/25/23 0842 06/25/23 1020 06/25/23 1234 06/25/23 1623 06/25/23 2100 06/26/23 0428 06/26/23 0904  NA 130* 132* 130* 124* 122* 123* 124* 119* 113* 114* 116* 116* 117* 120* 121*  121* 123*  K 4.0 3.9 3.8 3.7 3.8 3.7 4.1 4.2 4.5  --   --   --   --   --  5.0  --   CL 100 102 102 98 98 95* 95* 90* 87*  --   --   --   --   --  92*  --   CO2 17* 22 22 21* 19* 19* 20* 20* 19*  --   --   --   --   --  20*  --   GLUCOSE 142* 116* 133* 138* 135* 192* 132* 128* 132*  --   --   --   --   --  93  --   BUN 42* 40* 29* 26* 25* 23* 24* 26* 33*  --   --   --   --   --  39*  --   CREATININE 1.35* 1.02 0.84 0.66 0.73 0.76 0.63 0.66 0.78  --   --   --   --   --  0.90  --   ALBUMIN 2.4*  2.3* 2.3*  --   --   --  2.2* 2.4*  --   --   --   --   --   --   --   --   CALCIUM 8.9 8.9 9.0 9.0 8.9 8.9 8.5* 8.7* 8.5*  --   --   --   --   --  8.4*  --   PHOS  --  3.4  --   --   --   --   --   --   --   --   --   --   --   --   --   --   AST 82* 82* 84*  --   --   --  102* 114*  --   --   --   --   --   --   --   --   ALT 37 34 40  --   --   --  46* 54*  --   --   --   --   --   --   --   --    Liver Function Tests: Recent Labs  Lab 06/21/23 0750 06/23/23 0451 06/24/23 0724  AST 84* 102* 114*  ALT 40 46* 54*  ALKPHOS 163* 183* 240*  BILITOT 5.1* 4.0* 5.3*  PROT 5.5* 5.4* 5.9*  ALBUMIN 2.3* 2.2* 2.4*   No results for input(s): "LIPASE", "AMYLASE" in the last 168 hours. Recent Labs  Lab 06/19/23 2011  AMMONIA 71*   CBC: Recent Labs  Lab 06/19/23 2011 06/20/23 0427 06/22/23 0538 06/23/23 0451 06/24/23 0724 06/25/23 0507 06/26/23 0428  WBC 14.7*   < > 10.0 8.8 10.1 7.3 8.4  NEUTROABS 12.0*  --   --   --   --   --   --   HGB 11.1*   < > 12.1* 11.6* 12.6* 11.9* 10.5*  HCT 33.1*   < > 35.0* 34.5* 35.3* 33.3* 29.6*  MCV 104.7*   < > 102.3* 102.1* 100.0 100.3* 101.7*  PLT 102*   < > 83* 47* 74* 62* 42*   < > = values in this interval not displayed.   Cardiac Enzymes: No results for input(s): "CKTOTAL", "CKMB", "CKMBINDEX", "TROPONINI" in the last 168 hours. CBG: Recent Labs  Lab 06/19/23 1938  GLUCAP 172*    Iron Studies: No results for input(s): "IRON", "TIBC", "TRANSFERRIN", "FERRITIN" in the last 72 hours. Studies/Results: [place] EKG SITE RITE Result Date: 06/25/2023 If Site Rite image not attached, placement could not be confirmed due to current cardiac rhythm.   Chlorhexidine Gluconate Cloth  6 each Topical Daily   doxycycline  100 mg Oral Q12H   feeding supplement  237 mL Oral TID BM   lactulose  20 g Oral TID   midodrine  10 mg Oral TID WC   multivitamin with minerals  1 tablet Oral Daily   nystatin  5 mL Oral QID   mouth rinse  15 mL Mouth Rinse 4  times per day   pantoprazole  40 mg Oral BID AC   phytonadione  5 mg Oral Daily   rifaximin  550 mg Oral BID   sodium chloride flush  10-40 mL Intracatheter Q12H   tamsulosin  0.4 mg Oral Daily    BMET    Component Value Date/Time   NA 121 (L) 06/26/2023 0428   NA 121 (L) 06/26/2023 0428   NA 127 (L) 05/22/2022 1414   K 5.0 06/26/2023 0428   CL 92 (L) 06/26/2023 0428   CO2 20 (L) 06/26/2023 0428   GLUCOSE 93 06/26/2023 0428   BUN 39 (H) 06/26/2023 0428   BUN 24 05/22/2022 1414   CREATININE 0.90 06/26/2023 0428   CREATININE 1.07 12/06/2022 1338   CALCIUM 8.4 (L) 06/26/2023 0428   GFRNONAA >60 06/26/2023 0428   GFRAA 50 (L) 02/12/2020 0940   CBC    Component Value Date/Time   WBC 8.4 06/26/2023 0428   RBC 2.91 (L) 06/26/2023 0428   HGB 10.5 (L) 06/26/2023 0428   HCT 29.6 (L) 06/26/2023 0428   PLT 42 (L) 06/26/2023 0428   MCV 101.7 (H) 06/26/2023 0428   MCH 36.1 (H) 06/26/2023 0428   MCHC 35.5 06/26/2023 0428   RDW 17.6 (H) 06/26/2023 0428   LYMPHSABS 0.7 06/19/2023 2011   MONOABS 1.8 (H) 06/19/2023 2011   EOSABS 0.1 06/19/2023 2011   BASOSABS 0.0 06/19/2023 2011      Assessment/Plan:  Acute  on chronic hyponatremia - UNa <10, Uosm 813, Sosm 277 on 06/22/23.  Urine studies consistent with cause due to intravascular volume depletion in setting of anasarca from cirrhosis and use of diuretics.  Started on 3% saline 30 mL/hr yesterday around 8 am and sodium level has slowly improved appropriately to 123 this morning (from 113). Concerned about not clearing secretions and overall decline in condition.  Will stop 3% for now and follow sodium level.  Will dose with IV albumin as well. Cirrhosis with elevated LFT's - continue to follow closely.  Agree with midodrine and albumin.   Metabolic encephalopathy - multifactorial with elevated ammonia, as well as hyponatremia, and possible infection.  Negative UDS.  Per primary service.  Continue with lactulose and rifaximin. Suicidal  ideation/depression - per primary. Acute urinary retention - s/p foley catheter.  Continue with tamsulosin RLE cellulitis - started on cefadroxil per primary Hypoalbuminemia - due to cirrhosis.  Use IV albumin prn.  Continue with protein supplementation. Disposition - prognosis poor at this time.  Currently DNR and agree with transition to comfort care if his condition continues to worsen.  Appreciate palliative care's assistance.  Irena Cords, MD [employer] [phone]

## 2023-06-26 NOTE — Progress Notes (Signed)
OT Cancellation Note  Patient Details Name: Alejandro Porter MRN: [id number] DOB: 05-Oct-1975   Cancelled Treatment:    Reason Eval/Treat Not Completed: Medical issues which prohibited therapy Patient transferred to a higher level of care and will need new OT consult to resume therapy when patient is medically stable.  Thank you.   Ladell Lea OT, MOT   [name] 06/26/2023, 11:46 AM

## 2023-06-26 NOTE — Progress Notes (Signed)
Palliative: [name], Alejandro Porter, is lying in bed in the ICU.  He appears acutely/chronically ill and very frail.  He appears uncomfortable.  He does not interact with me in any meaningful way and clearly cannot make his basic needs known.  There is no family at bedside at this time.  Face-to-face discussion with bedside nursing staff who shares that Alejandro Porter will occasionally communicate.  Call to brother, Alejandro Porter.  We talk about Alejandro Porter's acute health concerns and his declines.  We talked about what we can and cannot change.  We talk about comfort and dignity at end-of-life.  Alejandro Porter readily agrees to comfort care.  He shares his concerns about Alejandro Porter's quality of life over the last few months, in particular since his father died.  We talked about comfort care, what is and is not provided.  We will unburden Alejandro Porter from painful treatments and also those that are not changing things for him.  I shared that we will the last medicines for pain, anxiety and breathlessness.  Alejandro Porter shares his gratitude.  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  Conference with attending, bedside nursing staff, transition of care team related to patient condition, needs, goals of care, disposition.  Plan: Comfort and dignity at end-of-life, comfort care.  End-of-life order set implemented. Prognosis: Anticipate hours to days, in-hospital death.  50 minutes  Lillia Carmel, NP  Palliative medicine team Team phone [phone]

## 2023-06-27 DIAGNOSIS — R531 Weakness: Secondary | ICD-10-CM | POA: Diagnosis not present

## 2023-06-27 DIAGNOSIS — L03115 Cellulitis of right lower limb: Secondary | ICD-10-CM | POA: Diagnosis not present

## 2023-06-27 DIAGNOSIS — R627 Adult failure to thrive: Secondary | ICD-10-CM | POA: Diagnosis not present

## 2023-06-27 DIAGNOSIS — D696 Thrombocytopenia, unspecified: Secondary | ICD-10-CM | POA: Diagnosis not present

## 2023-06-27 DIAGNOSIS — G9341 Metabolic encephalopathy: Secondary | ICD-10-CM | POA: Diagnosis not present

## 2023-06-27 DIAGNOSIS — Z515 Encounter for palliative care: Secondary | ICD-10-CM | POA: Diagnosis not present

## 2023-06-27 LAB — CULTURE, BODY FLUID W GRAM STAIN -BOTTLE

## 2023-06-27 NOTE — Plan of Care (Signed)
  Problem: Education: Goal: Knowledge of General Education information will improve Description: Including pain rating scale, medication(s)/side effects and non-pharmacologic comfort measures 06/27/2023 0633 by Delton Prairie, RN Outcome: Progressing 06/27/2023 0632 by Delton Prairie, RN Outcome: Progressing   Problem: Health Behavior/Discharge Planning: Goal: Ability to manage health-related needs will improve 06/27/2023 0633 by Delton Prairie, RN Outcome: Progressing 06/27/2023 [phone] by Delton Prairie, RN Outcome: Progressing   Problem: Clinical Measurements: Goal: Ability to maintain clinical measurements within normal limits will improve 06/27/2023 0633 by Delton Prairie, RN Outcome: Progressing 06/27/2023 0632 by Delton Prairie, RN Outcome: Progressing Goal: Will remain free from infection 06/27/2023 0633 by Delton Prairie, RN Outcome: Progressing 06/27/2023 0632 by Delton Prairie, RN Outcome: Progressing Goal: Diagnostic test results will improve 06/27/2023 0633 by Delton Prairie, RN Outcome: Progressing 06/27/2023 0632 by Delton Prairie, RN Outcome: Progressing Goal: Respiratory complications will improve 06/27/2023 0633 by Delton Prairie, RN Outcome: Progressing 06/27/2023 0632 by Delton Prairie, RN Outcome: Progressing Goal: Cardiovascular complication will be avoided 06/27/2023 1914 by Delton Prairie, RN Outcome: Progressing 06/27/2023 0632 by Delton Prairie, RN Outcome: Progressing   Problem: Activity: Goal: Risk for activity intolerance will decrease 06/27/2023 7829 by Delton Prairie, RN Outcome: Progressing 06/27/2023 [phone] by Delton Prairie, RN Outcome: Progressing   Problem: Nutrition: Goal: Adequate nutrition will be maintained 06/27/2023 3086 by Delton Prairie, RN Outcome: Progressing 06/27/2023 [phone] by Delton Prairie, RN Outcome: Progressing   Problem: Coping: Goal: Level of anxiety will  decrease 06/27/2023 0633 by Delton Prairie, RN Outcome: Progressing 06/27/2023 [phone] by Delton Prairie, RN Outcome: Progressing   Problem: Elimination: Goal: Will not experience complications related to bowel motility 06/27/2023 0633 by Delton Prairie, RN Outcome: Progressing 06/27/2023 [phone] by Delton Prairie, RN Outcome: Progressing Goal: Will not experience complications related to urinary retention 06/27/2023 0633 by Delton Prairie, RN Outcome: Progressing 06/27/2023 0632 by Delton Prairie, RN Outcome: Progressing   Problem: Pain Management: Goal: General experience of comfort will improve 06/27/2023 0633 by Delton Prairie, RN Outcome: Progressing 06/27/2023 0632 by Delton Prairie, RN Outcome: Progressing   Problem: Safety: Goal: Ability to remain free from injury will improve 06/27/2023 0633 by Delton Prairie, RN Outcome: Progressing 06/27/2023 0632 by Delton Prairie, RN Outcome: Progressing   Problem: Skin Integrity: Goal: Risk for impaired skin integrity will decrease 06/27/2023 0633 by Delton Prairie, RN Outcome: Progressing 06/27/2023 0632 by Delton Prairie, RN Outcome: Progressing

## 2023-06-27 NOTE — Hospital Course (Addendum)
Kelvin Odysseus Cada is a 48 y.o. male with a history of NASH cirrhosis s/p TIPS who presented to the ED on 06/19/2023 with suicidal thoughts at home. History limited due to encephalopathy. He was tachycardic, afebrile, WBC 14.7k. He was treated for infection, cellulitis, then UTI, increased lactulose and added rifaximin with improvement in mentation. He has, per his brother, a history of cognitive impairment to some degree, and had improved near his baseline. He has not had any evidence of ongoing suicidal ideation, psychiatry cleared him. Hospitalization complicated by urinary retention for which foley was placed, and later severe hyponatremia in the setting of progressive anasarca from decompensated cirrhosis. He was transferred to the ICU, PICC placed, hypertonic saline initiated with steady improvement in sodium level but overall continued clinical decline. Having bleeding due to coagulopathy treated with vitamin K. Palliative care has continued conversations with the patient and family.  Ultimately, patient was transitioned to comfort care measure after palliative medicine conversations.  His comfort measure medications were monitored and adjusted to optimized symptom management. Family was continually updated.  Ultimately, they agreed to transition patient to residential hospice.

## 2023-06-27 NOTE — Plan of Care (Signed)
  Problem: Education: Goal: Knowledge of General Education information will improve Description: Including pain rating scale, medication(s)/side effects and non-pharmacologic comfort measures Outcome: Not Progressing   Problem: Health Behavior/Discharge Planning: Goal: Ability to manage health-related needs will improve Outcome: Not Progressing   Problem: Nutrition: Goal: Adequate nutrition will be maintained Outcome: Not Progressing   

## 2023-06-27 NOTE — Progress Notes (Signed)
Palliative care meeting decision to transition to comfort care noted.  Nothing further to add.  Will sign off.

## 2023-06-27 NOTE — Progress Notes (Signed)
Palliative: Alejandro Porter, Alejandro Porter, is lying quietly in bed.  He appears acutely ill, actively dying.  He does not respond in any meaningful way to voice or touch.  He clearly cannot make his basic needs known.  There is no family at bedside at this time.  Alejandro Porter's brother, Alejandro Porter, has elected comfort care.  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  End-of-life order set in place.  Face-to-face conference with bedside nursing staff related to patient condition, needs.  Conference with attending, bedside nursing staff, transition of care team related to patient condition, needs, goals of care, disposition.  Plan: Comfort and dignity at end-of-life.  End-of-life order set in place.  No symptom management needs identified. Prognosis: Anticipate hours to days, in-hospital death.  Too unstable for transport.  35 minutes Lillia Carmel, NP Palliative medicine team Team phone [phone]

## 2023-06-27 NOTE — Progress Notes (Signed)
PROGRESS NOTE  Alejandro Porter WUJ:[id number] DOB: September 18, 1975 DOA: 06/19/2023 PCP: Reather Converse, PA-C  Brief History:  Alejandro Porter is a 48 y.o. male with a history of NASH cirrhosis s/p TIPS who presented to the ED on 06/19/2023 with suicidal thoughts at home. History limited due to encephalopathy. He was tachycardic, afebrile, WBC 14.7k. He was treated for infection, cellulitis, then UTI, increased lactulose and added rifaximin with improvement in mentation. He has, per his brother, a history of cognitive impairment to some degree, and had improved near his baseline. He has not had any evidence of ongoing suicidal ideation, psychiatry cleared him. Hospitalization complicated by urinary retention for which foley was placed, and later severe hyponatremia in the setting of progressive anasarca from decompensated cirrhosis. He was transferred to the ICU, PICC placed, hypertonic saline initiated with steady improvement in sodium level but overall continued clinical decline. Having bleeding due to coagulopathy treated with vitamin K. Palliative care has continued conversations with the patient and family.  Ultimately, patient was transitioned to comfort care measure after palliative medicine conversations.  He is DNR, and in-hospital death is expected.   Assessment/Plan: Goals of care: Pt is confirmed DNR and would not desire escalation of care further than our current situation. Palliative and medical teams have been in communication with family. We suspect his prognosis is limited to hours-days regardless of maximal medical therapy at this time. Given poor level of alertness and suspected inability to manage secretions, will minimize oral medications.  -pt has continued to decline despite aggressive medical therapy -palliative medicine conversation with family>>transition to full comfort measures   Hyponatremia: Has this chronically related to cirrhosis but worsened after SSRI  introduced, since stopped.  -Worsened with reintroduction of lasix as well.  - Improved on hypertonic saline, though underlying issue of intravascular depletion and extravascular overload due to decompensated cirrhosis remains.  -appreciate nephrology   Multifactorial metabolic encephalopathy:  -Hyponatremia, Hyperammonemia likely contributing, though infection not completely ruled out. Note initial concern for RLE cellulitis which is being treated with abx. CT head showed no acute intracranial abnormality. Negative UDS. - Level of orientation initially improved Though he remains disoriented to year and with insight limitation which precludes his capacity to make the decision to leave the hospital against medical advice at this time.  -unfortunate, mental status declined again and continued to worsen - Limit sedating medications, though his delirium is now limiting patient care, pulling IV's - initially high dose lactulose. Added rifaximin given persistent AMS and ammonia elevation.  - Insufficient fluid for diagnostic paracentesis 1/15, and was covered with antibiotics empirically to cover SBP in addition to his cellulitis as below.    Umbilical hernia, dating back to at least 2022 with complex fluid collection: ?seroma - D/w surgery, Dr. Lovell Sheehan, now s/p 90cc clear yellow fluid drained 1/17 by Dr. Deanne Coffer. Negative gram stain, cell counts not appearing overtly infected, negative culture. Suspect seroma.  -no leukocytosis, no fever   Suicidal ideation, depression: This appears to have been situational and in the setting of encephalopathy. This has durably resolved. He has no thoughts of self harm at this time and no plans.  - Psychiatry consulted, started SSRI (since stopped due to hyponatremia), and prn hydroxyzine. No suicide precautions required, no psychiatric inpatient admission recommended.     Decompensated NASH cirrhosis:  -MELD score is 28 (19.6% 58-month mortality) - Appeared  intravascularly depleted, so diuretics held.  -Restarted due to volume overload  but UNa low and intravascularly depleted so stopped lasix again.  - Coagulopathy noted. Tx w/vitamin K. - Given his advanced cirrhosis and failure to thrive with malnutrition, palliative care is consulted for additional support.    Acute urinary retention: Prostate grossly unremarkable on CT which also showed normal right kidney, nonobstructing 2mm stones in left kidney without hydronephrosis. Bladder wnl. Urinalysis with some RBCs, protein and WBCs, hyaline casts. Urine culture negative.  - Continue tamsulosin, foley   Iron deficiency, macrocytic anemia, thrombocytopenia: B12 and folic acid are replete. Suspect this is due to cirrhosis.  - Having lip bleeding, so have to stop lovenox VTE ppx. Use SCDs  - no comfort measures   GERD: Thickening of duodenum suggested by CT. Will keep this in mind without directed treatment at this time.  - Continue PPI--d/c as pt is transitioned to comfort measures   Falls at home, deconditioning: Appears to be failing to thrive. CT revealed multiple bilateral rib fractures in stages of healing.  - PT/OT/RD evaluations appreciated.    AKI: Improved with supportive measures, holding diuretics. CrCl > 33ml/min.   Prolonged QT interval: at admission.  - Minimize provocative agents   RLE cellulitis:  - Nonpurulent, converted to cefadroxil, clinically much improved, Leukocytosis has resolved. Will stop abx.    Severe protein calorie malnutrition:  - Supplement protein as much as possible.    Recent inguinal hernia repair: Dec 2024 at [facility]. No current issues. ?etiology of dot of free air on CT.        Family Communication:  no Family at bedside  Consultants:  palliative  Code Status:  FULL COMFORT  DVT Prophylaxis:  FULL COMFORT   Procedures: As Listed in Progress Note Above  Antibiotics: None     Subjective: Pt is somnolent.  Does not answer  questions.  ROS not possible  Objective: Vitals:   06/26/23 1200 06/26/23 1219 06/26/23 2316 06/27/23 0913  BP:   (!) 114/58 (!) 96/53  Pulse: 89 99 97 94  Resp: 19 20 16 17   Temp: (!) 96.8 F (36 C) (!) 97.3 F (36.3 C) 97.6 F (36.4 C) (!) 97.3 F (36.3 C)  TempSrc:   Axillary Axillary  SpO2: 96% 97% 93% (!) 82%  Weight:      Height:        Intake/Output Summary (Last 24 hours) at 06/27/2023 1821 Last data filed at 06/27/2023 1700 Gross per 24 hour  Intake 0 ml  Output 1250 ml  Net -1250 ml   Weight change:  Exam:  General:  Pt is somnolent, does not follows commands appropriately, not in acute distress HEENT: No icterus, No thrush, No neck mass, [place]/AT Cardiovascular: RRR, S1/S2, no rubs, no gallops Respiratory: bilateral rhonchi Abdomen: Soft/+BS, non tender, mild distended, no guarding Extremities: 2 + LE edema, No lymphangitis, No petechiae, No rashes, no synovitis   Data Reviewed: I have personally reviewed following labs and imaging studies Basic Metabolic Panel: Recent Labs  Lab 06/22/23 1837 06/23/23 0451 06/24/23 0724 06/25/23 0507 06/25/23 8413 06/25/23 1234 06/25/23 1623 06/25/23 2100 06/26/23 0428 06/26/23 0904  NA 123* 124* 119* 113*   < > 116* 117* 120* 121*  121* 123*  K 3.7 4.1 4.2 4.5  --   --   --   --  5.0  --   CL 95* 95* 90* 87*  --   --   --   --  92*  --   CO2 19* 20* 20* 19*  --   --   --   --  20*  --   GLUCOSE 192* 132* 128* 132*  --   --   --   --  93  --   BUN 23* 24* 26* 33*  --   --   --   --  39*  --   CREATININE 0.76 0.63 0.66 0.78  --   --   --   --  0.90  --   CALCIUM 8.9 8.5* 8.7* 8.5*  --   --   --   --  8.4*  --    < > = values in this interval not displayed.   Liver Function Tests: Recent Labs  Lab 06/21/23 0750 06/23/23 0451 06/24/23 0724  AST 84* 102* 114*  ALT 40 46* 54*  ALKPHOS 163* 183* 240*  BILITOT 5.1* 4.0* 5.3*  PROT 5.5* 5.4* 5.9*  ALBUMIN 2.3* 2.2* 2.4*   No results for input(s): "LIPASE",  "AMYLASE" in the last 168 hours. No results for input(s): "AMMONIA" in the last 168 hours. Coagulation Profile: Recent Labs  Lab 06/25/23 1144  INR 1.5*   CBC: Recent Labs  Lab 06/22/23 0538 06/23/23 0451 06/24/23 0724 06/25/23 0507 06/26/23 0428  WBC 10.0 8.8 10.1 7.3 8.4  HGB 12.1* 11.6* 12.6* 11.9* 10.5*  HCT 35.0* 34.5* 35.3* 33.3* 29.6*  MCV 102.3* 102.1* 100.0 100.3* 101.7*  PLT 83* 47* 74* 62* 42*   Cardiac Enzymes: No results for input(s): "CKTOTAL", "CKMB", "CKMBINDEX", "TROPONINI" in the last 168 hours. BNP: Invalid input(s): "POCBNP" CBG: No results for input(s): "GLUCAP" in the last 168 hours. HbA1C: No results for input(s): "HGBA1C" in the last 72 hours. Urine analysis:    Component Value Date/Time   COLORURINE AMBER (A) 06/25/2023 1549   APPEARANCEUR HAZY (A) 06/25/2023 1549   LABSPEC 1.024 06/25/2023 1549   PHURINE 5.0 06/25/2023 1549   GLUCOSEU NEGATIVE 06/25/2023 1549   HGBUR NEGATIVE 06/25/2023 1549   BILIRUBINUR NEGATIVE 06/25/2023 1549   KETONESUR NEGATIVE 06/25/2023 1549   PROTEINUR NEGATIVE 06/25/2023 1549   NITRITE NEGATIVE 06/25/2023 1549   LEUKOCYTESUR NEGATIVE 06/25/2023 1549   Sepsis Labs: @LABRCNTIP (procalcitonin:4,lacticidven:4) ) Recent Results (from the past 240 hours)  Blood culture (routine x 2)     Status: None   Collection Time: 06/19/23 10:33 PM   Specimen: BLOOD  Result Value Ref Range Status   Specimen Description BLOOD BLOOD LEFT ARM  Final   Special Requests   Final    BOTTLES DRAWN AEROBIC AND ANAEROBIC Blood Culture adequate volume   Culture   Final    NO GROWTH 5 DAYS Performed at [facility], [address], [place], [place] [address]    Report Status 06/24/2023 FINAL  Final  Blood culture (routine x 2)     Status: None   Collection Time: 06/19/23 10:36 PM   Specimen: BLOOD  Result Value Ref Range Status   Specimen Description BLOOD BLOOD LEFT HAND  Final   Special Requests   Final    BOTTLES DRAWN AEROBIC  AND ANAEROBIC Blood Culture adequate volume   Culture   Final    NO GROWTH 5 DAYS Performed at [facility], [address], [place], [place] [address]    Report Status 06/24/2023 FINAL  Final  Urine Culture     Status: None   Collection Time: 06/21/23  5:20 PM   Specimen: Urine, Random  Result Value Ref Range Status   Specimen Description   Final    URINE, RANDOM Performed at [facility], [address], [place], [place]  [address]    Special Requests   Final    NONE Reflexed from [id number] Performed at [facility], [address] [place], [place] [address]    Culture   Final    NO GROWTH Performed at [facility], [address], [place], [place] [address]    Report Status 06/22/2023 FINAL  Final  Gram stain     Status: None   Collection Time: 06/22/23  4:15 PM   Specimen: Path fluid  Result Value Ref Range Status   Specimen Description FLUID  Final   Special Requests PERIUMBILICAL DRAINAGE  Final   Gram Stain   Final    CYTOSPIN SMEAR NO ORGANISMS SEEN WBC PRESENT,BOTH PMN AND MONONUCLEAR Performed at [facility], [address] [place], [place] [address]    Report Status 06/22/2023 FINAL  Final  Culture, body fluid w Gram Stain-bottle     Status: None   Collection Time: 06/22/23  4:15 PM   Specimen: Path fluid  Result Value Ref Range Status   Specimen Description FLUID 10CC  Final   Special Requests PERIUMBILICAL DRAINAGE  Final   Culture   Final    NO GROWTH 5 DAYS Performed at [facility], [address], [place], [place] [address]    Report Status 06/27/2023 FINAL  Final  MRSA Next Gen by PCR, Nasal     Status: None   Collection Time: 06/25/23  8:00 AM   Specimen: Nasal Mucosa; Nasal Swab  Result Value Ref Range Status   MRSA by PCR Next Gen NOT DETECTED NOT DETECTED Final    Comment: (NOTE) The GeneXpert MRSA Assay (FDA approved for NASAL specimens only), is one component of a comprehensive MRSA colonization surveillance program. It is not intended to  diagnose MRSA infection nor to guide or monitor treatment for MRSA infections. Test performance is not FDA approved in patients less than 72 years old. Performed at [facility], [address], [place], [place] [address]      Scheduled Meds: Continuous Infusions:  Procedures/Studies: [place] EKG SITE RITE Result Date: 06/25/2023 If Site Rite image not attached, placement could not be confirmed due to current cardiac rhythm.  DG Chest 2 View Result Date: 06/24/2023 CLINICAL DATA:  Shortness of breath, chest pain. EXAM: CHEST - 2 VIEW COMPARISON:  Chest radiograph dated 06/19/2023. FINDINGS: The heart size and mediastinal contours are within normal limits. Moderate bilateral perihilar interstitial and airspace opacities. No pleural effusion or pneumothorax. The visualized skeletal structures are unremarkable. IMPRESSION: Moderate bilateral perihilar interstitial and airspace opacities may represent pulmonary edema or atypical infection. Electronically Signed   By: Romona Curls M.D.   On: 06/24/2023 12:58   [place] IMAGE GUIDED DRAINAGE BY PERCUTANEOUS CATHETER Result Date: 06/22/2023 INDICATION: Periumbilical collection after hernia repair EXAM: ULTRASOUND-GUIDED ASPIRATION SUBCUTANEOUS COLLECTION MEDICATIONS: No periprocedural antibiotics were indicated ANESTHESIA/SEDATION: Lidocaine 1% subcutaneous COMPLICATIONS: None immediate. PROCEDURE: Informed written consent was obtained from the patient after a thorough discussion of the procedural risks, benefits and alternatives. All questions were addressed. Maximal Sterile Barrier Technique was utilized including caps, mask, sterile gowns, sterile gloves, sterile drape, hand hygiene and skin antiseptic. A timeout was performed prior to the initiation of the procedure. Survey ultrasound of the region was performed and an appropriate skin entry site was determined and marked. Region prepped with chlorhexidine, draped in usual sterile fashion, infiltrated locally  with 1% lidocaine. 21 gauge princess needle advanced into the collection. 90 mL of thin straw-colored fluid were aspirated, sent for Gram stain  and culture. Moderate residual for fluid was identified but aggressive measures for complete evacuation were avoided to minimize any risk of continuing leak at the needle entry site. Needle was removed and a sterile dressing applied. No immediate complication. The patient tolerated the procedure well. IMPRESSION: 1. Technically successful ultrasound-guided aspiration , periumbilical subcutaneous fluid. Sample sent for Gram stain and culture. Electronically Signed   By: Corlis Leak M.D.   On: 06/22/2023 17:06   US Abdomen Limited Result Date: 06/22/2023 CLINICAL DATA:  Umbilical hernia. EXAM: ULTRASOUND ABDOMEN LIMITED COMPARISON:  CT abdomen pelvis 06/19/2023 FINDINGS: The site of the umbilicus there is a 6.7 x 3.0 x 6.6 cm complex fluid collection. No definite herniated fat identified. IMPRESSION: Complex fluid collection at the site of the umbilicus. No definite herniated fat identified. Electronically Signed   By: Annia Belt M.D.   On: 06/22/2023 14:15   [place] ASCITES (ABDOMEN LIMITED) Result Date: 06/20/2023 CLINICAL DATA:  Patient with history of cirrhosis, portal HTN s/p TIPS creation 09/04/22 who is currently admitted with altered mental status and multiple rib fractures. Request for possible paracentesis. EXAM: LIMITED ABDOMEN ULTRASOUND FOR ASCITES TECHNIQUE: Limited ultrasound survey for ascites was performed in all four abdominal quadrants. COMPARISON:  CT abd/pelvis w/contrast 06/18/22. FINDINGS: Small amount of ascites in the right upper quadrant only that is not amenable to paracentesis. IMPRESSION: Small amount of ascites.  Paracentesis was not performed. Electronically Signed   By: Marliss Coots M.D.   On: 06/20/2023 16:01   CT ABDOMEN PELVIS W CONTRAST Result Date: 06/19/2023 CLINICAL DATA:  Abdominal pain, suicidal ideation EXAM: CT ABDOMEN AND PELVIS  WITH CONTRAST TECHNIQUE: Multidetector CT imaging of the abdomen and pelvis was performed using the standard protocol following bolus administration of intravenous contrast. RADIATION DOSE REDUCTION: This exam was performed according to the departmental dose-optimization program which includes automated exposure control, adjustment of the mA and/or kV according to patient size and/or use of iterative reconstruction technique. CONTRAST:  OMNIPAQUE IOHEXOL 300 MG/ML  SOLN COMPARISON:  MRI abdomen dated 01/25/2023. CTA abdomen/pelvis dated 11/10/2022. FINDINGS: Motion degraded images. Lower chest: Mild patchy lingular and left lower lobe opacities, favoring atelectasis. Hepatobiliary: Cirrhosis. TIPS. Embolization coils along the falciform ligament (of a recanalized periumbilical vein) with streak artifact along the liver. Within that constraint, there are no findings suspicious for [facility]. Layering tiny gallstones (series 3/image 30), without associated inflammatory changes. No intrahepatic or extrahepatic duct dilatation. Pancreas: Within normal limits. Spleen: Within the upper limits of normal for size. Adrenals/Urinary Tract: 4.5 cm left adrenal nodule, containing macroscopic fat, compatible with a benign adrenal myelolipoma. Right adrenal glands are within normal limits. Right kidney is within normal limits. Two nonobstructing left lower pole renal calculi measuring up to 2 mm (series 3/image 32). No hydronephrosis. Bladder is within normal limits. Stomach/Bowel: Stomach is within normal limits. No evidence of bowel obstruction. Mild wall thickening involving the descending duodenum (series 3/image 38), correlate for duodenitis. Suspected appendix is within normal limits (series 3/image 55). No colonic wall thickening or inflammatory changes. Vascular/Lymphatic: No evidence of abdominal aortic aneurysm. Atherosclerotic calcifications of the abdominal aorta and branch vessels, although vessels remain patent. No  suspicious abdominopelvic lymphadenopathy. Reproductive: Prostate is grossly unremarkable. Other: Trace pelvic ascites. Suspected tiny foci of free air overlying the right liver (series 3/images 15 and 20), correlate for recent prior intervention/paracentesis. Musculoskeletal: Multiple subacute/healing bilateral rib fracture deformities. Healed left lateral transverse process deformities at L1-2. No acute fracture is seen. IMPRESSION: Motion degraded images. Multiple  subacute/healing bilateral rib fracture deformities. Healed left lateral transverse process deformities at L1-2. No acute fracture is seen. Suspected tiny foci of free air overlying the right liver, correlate for recent prior intervention/paracentesis. Mild wall thickening involving the descending duodenum, correlate for duodenitis. Cirrhosis. TIPS. No findings suspicious for HCC. Additional ancillary findings as above. Electronically Signed   By: Charline Bills M.D.   On: 06/19/2023 22:10   CT Head Wo Contrast Result Date: 06/19/2023 CLINICAL DATA:  Mental status change, unknown cause EXAM: CT HEAD WITHOUT CONTRAST TECHNIQUE: Contiguous axial images were obtained from the base of the skull through the vertex without intravenous contrast. RADIATION DOSE REDUCTION: This exam was performed according to the departmental dose-optimization program which includes automated exposure control, adjustment of the mA and/or kV according to patient size and/or use of iterative reconstruction technique. COMPARISON:  Head CT 04/10/2023 FINDINGS: Brain: No hemorrhage. No hydrocephalus. No extra-axial fluid collection. No mass effect. No mass lesion. No CT evidence of acute cortical infarct. Vascular: No hyperdense vessel or unexpected calcification. Skull: Normal. Negative for fracture or focal lesion. Sinuses/Orbits: No middle ear or mastoid effusion. Paranasal sinuses are clear. Orbits are unremarkable. Other: None. IMPRESSION: No acute intracranial  abnormality. Electronically Signed   By: Lorenza Cambridge M.D.   On: 06/19/2023 21:48   DG Chest Portable 1 View Result Date: 06/19/2023 CLINICAL DATA:  Weakness EXAM: PORTABLE CHEST 1 VIEW COMPARISON:  10/05/2022 FINDINGS: Low lung volumes. Borderline to mild cardiomegaly. Streaky atelectasis or scarring at the left base. No pleural effusion or pneumothorax IMPRESSION: Low lung volumes with streaky atelectasis or scarring at the left base. Electronically Signed   By: Jasmine Pang M.D.   On: 06/19/2023 20:42    Catarina Hartshorn, DO  [facility]  If 7PM-7AM, please contact night-coverage [email or web address] Password [facility] 06/27/2023, 6:21 PM   LOS: 8 days

## 2023-06-28 DIAGNOSIS — E8809 Other disorders of plasma-protein metabolism, not elsewhere classified: Secondary | ICD-10-CM | POA: Diagnosis not present

## 2023-06-28 DIAGNOSIS — D696 Thrombocytopenia, unspecified: Secondary | ICD-10-CM | POA: Diagnosis not present

## 2023-06-28 DIAGNOSIS — N179 Acute kidney failure, unspecified: Secondary | ICD-10-CM | POA: Diagnosis not present

## 2023-06-28 DIAGNOSIS — E7220 Disorder of urea cycle metabolism, unspecified: Secondary | ICD-10-CM | POA: Diagnosis not present

## 2023-06-28 DIAGNOSIS — Z515 Encounter for palliative care: Secondary | ICD-10-CM | POA: Diagnosis not present

## 2023-06-28 DIAGNOSIS — G9341 Metabolic encephalopathy: Secondary | ICD-10-CM | POA: Diagnosis not present

## 2023-06-28 DIAGNOSIS — R627 Adult failure to thrive: Secondary | ICD-10-CM | POA: Diagnosis not present

## 2023-06-28 MED ORDER — FENTANYL CITRATE PF 50 MCG/ML IJ SOSY
50.0000 ug | PREFILLED_SYRINGE | INTRAMUSCULAR | Status: DC
  Administered 2023-06-28 – 2023-07-02 (×25): 50 ug via INTRAVENOUS
  Filled 2023-06-28 (×25): qty 1

## 2023-06-28 NOTE — Progress Notes (Signed)
Withdraws from pain but otherwise nonresponsiive. Oral care performed and suctioned.  Did have family visiting earlier, a brother and some cousins.

## 2023-06-28 NOTE — Progress Notes (Signed)
Palliative:   Alejandro Porter is lying quietly in bed.  He appears to be actively dying.  He does not interact with me in any meaningful way.  He clearly cannot make his basic needs known.  There is no family at bedside at this time.  Bedside nursing staff is present attending to needs.  Face-to-face conference with bedside nursing staff related to patient condition, needs.  Call to brother, Altan Bodley, with cousin Clemetine Marker also on the telephone.  We talk about [name]'s comfort, medication needs.  Medication for comfort scheduled.  Lupita Leash states that she saw [place] yesterday evening.  Family states that they "went through this" about 6 months ago.  Support offered.  No questions from [place] or Lupita Leash.  Conference with attending, bedside nurse, transition of care team related to patient condition, needs, goals of care, disposition.  Plan: Comfort and dignity at end-of-life, comfort care implemented 1/21.  End-of-life order set in place.  Comfort meds scheduled.   Prognosis:  Anticipate death within the next 24 hours, in-hospital death.  50 minutes  Lillia Carmel, NP Palliative Medicine Team  Team Phone [phone]

## 2023-06-28 NOTE — Plan of Care (Signed)
  Problem: Education: Goal: Knowledge of General Education information will improve Description: Including pain rating scale, medication(s)/side effects and non-pharmacologic comfort measures Outcome: Not Applicable   Problem: Health Behavior/Discharge Planning: Goal: Ability to manage health-related needs will improve Outcome: Not Applicable

## 2023-06-28 NOTE — Progress Notes (Signed)
Given prn ativan and robinul  today along with scheduled fentanyl. Mouth care done throughout day and suctioning and prn eyedrops given.  Has been sleeping all day but does react with mouth care and eyedrop administration.  Urine output for day is 250 in foley.

## 2023-06-28 NOTE — Progress Notes (Signed)
PROGRESS NOTE  Tho Radice ZOX:[id number] DOB: [birth year] DOA: 06/19/2023 PCP: Reather Converse, PA-C  Brief History:  Alejandro Porter is a 48 y.o. male with a history of NASH cirrhosis s/p TIPS who presented to the ED on 06/19/2023 with suicidal thoughts at home. History limited due to encephalopathy. He was tachycardic, afebrile, WBC 14.7k. He was treated for infection, cellulitis, then UTI, increased lactulose and added rifaximin with improvement in mentation. He has, per his brother, a history of cognitive impairment to some degree, and had improved near his baseline. He has not had any evidence of ongoing suicidal ideation, psychiatry cleared him. Hospitalization complicated by urinary retention for which foley was placed, and later severe hyponatremia in the setting of progressive anasarca from decompensated cirrhosis. He was transferred to the ICU, PICC placed, hypertonic saline initiated with steady improvement in sodium level but overall continued clinical decline. Having bleeding due to coagulopathy treated with vitamin K. Palliative care has continued conversations with the patient and family.  Ultimately, patient was transitioned to comfort care measure after palliative medicine conversations.  He is DNR, and in-hospital death is expected.   Assessment/Plan: Goals of care: Pt is confirmed DNR and would not desire escalation of care further than our current situation. Palliative and medical teams have been in communication with family. --prognosis is limited to hours-days regardless of maximal medical therapy at this time. Given poor level of alertness and suspected inability to manage secretions, minimize oral medications.  -pt has continued to decline despite aggressive medical therapy -palliative medicine conversation with family>>transition to full comfort measures -palliative continues to follow and adjust comfort measures medications   Hyponatremia:  -Has  this chronically related to cirrhosis but worsened after SSRI introduced, since stopped.  -Worsened with reintroduction of lasix as well.  - Improved on hypertonic saline, though underlying issue of intravascular depletion and extravascular overload due to decompensated cirrhosis remains.  -appreciate nephrology -no further labs done as patient has transitioned to full comfort measures   Multifactorial metabolic encephalopathy:  -Hyponatremia, Hyperammonemia likely contributing, though infection not completely ruled out. Note initial concern for RLE cellulitis which is being treated with abx. CT head showed no acute intracranial abnormality. Negative UDS. - Level of orientation initially improved Though he remained disoriented to year and with insight limitation which precludes his capacity to make the decision to leave the hospital against medical advice at this time.  -unfortunate;y, mental status declined again and continued to worsen again despite aggressive medical measures - Limit sedating medications, though his delirium is now limiting patient care, pulling IV's - initially high dose lactulose. Added rifaximin given persistent AMS and ammonia elevation.  - Insufficient fluid for diagnostic paracentesis 1/15, and was covered with antibiotics empirically to cover SBP in addition to his cellulitis as below.    Umbilical hernia, dating back to at least 2022 with complex fluid collection: ?seroma - D/w surgery, Dr. Lovell Sheehan, now s/p 90cc clear yellow fluid drained 1/17 by Dr. Deanne Coffer. Negative gram stain, cell counts not appearing overtly infected, negative culture. Suspect seroma.  -no leukocytosis, no fever   Suicidal ideation, depression: This appears to have been situational and in the setting of encephalopathy. This has durably resolved. He has no thoughts of self harm at this time and no plans.  - Psychiatry consulted, started SSRI (since stopped due to hyponatremia), and prn hydroxyzine.  No suicide precautions required, no psychiatric inpatient admission recommended.     Decompensated  NASH cirrhosis:  -MELD score is 28 (19.6% 61-month mortality) - Appeared intravascularly depleted, so diuretics held.  -Restarted due to volume overload but UNa low and intravascularly depleted so stopped lasix again.  - Coagulopathy noted. Tx w/vitamin K. - Given his advanced cirrhosis and failure to thrive with malnutrition, palliative care is consulted for additional support.    Acute urinary retention: Prostate grossly unremarkable on CT which also showed normal right kidney, nonobstructing 2mm stones in left kidney without hydronephrosis. Bladder wnl. Urinalysis with some RBCs, protein and WBCs, hyaline casts. Urine culture negative.  - Continue tamsulosin, foley   Iron deficiency, macrocytic anemia, thrombocytopenia: B12 and folic acid are replete. Suspect this is due to cirrhosis.  - Having lip bleeding, so have to stop lovenox VTE ppx. Use SCDs  - now comfort measures   GERD: Thickening of duodenum suggested by CT. Will keep this in mind without directed treatment at this time.  - Continue PPI--d/c as pt is transitioned to comfort measures   Falls at home, deconditioning: Appears to be failing to thrive. CT revealed multiple bilateral rib fractures in stages of healing.  - PT/OT/RD evaluations appreciated.    AKI: Improved with supportive measures, holding diuretics. CrCl > 79ml/min. -now comfort measures   Prolonged QT interval: at admission.  - Minimize provocative agents   RLE cellulitis:  - Nonpurulent, converted to cefadroxil, clinically much improved, Leukocytosis has resolved. Will stop abx.    Severe protein calorie malnutrition:  - Supplement protein as much as possible.    Recent inguinal hernia repair: Dec 2024 at [facility]. No current issues. ?etiology of dot of free air on CT.              Family Communication:  no Family at bedside   Consultants:   palliative   Code Status:  FULL COMFORT   DVT Prophylaxis:  FULL COMFORT     Procedures: As Listed in Progress Note Above   Antibiotics: None         Subjective:  Pt somnolent.  No vomiting.  No distress.  No uncontrolled pain. Objective: Vitals:   06/26/23 2316 06/27/23 0913 06/28/23 0047 06/28/23 1754  BP: (!) 114/58 (!) 96/53 112/75 (!) 84/50  Pulse: 97 94 98 88  Resp: 16 17 (!) 30 (!) 28  Temp: 97.6 F (36.4 C) (!) 97.3 F (36.3 C) 97.8 F (36.6 C)   TempSrc: Axillary Axillary Oral   SpO2: 93% (!) 82% (!) 79% (!) 78%  Weight:      Height:        Intake/Output Summary (Last 24 hours) at 06/28/2023 1810 Last data filed at 06/28/2023 1715 Gross per 24 hour  Intake --  Output 1050 ml  Net -1050 ml   Weight change:  Exam:  General:  Pt is somnolent, does not  follow commands appropriately, not in acute distress HEENT: No icterus, No thrush, No neck mass, [place]/AT Cardiovascular: RRR, S1/S2, no rubs, no gallops Respiratory: bilateral rhonchi Abdomen: Soft/+BS, non distended, no guarding Extremities: 2 + LE edema, No lymphangitis, No petechiae, No rashes, no synovitis   Data Reviewed: I have personally reviewed following labs and imaging studies Basic Metabolic Panel: Recent Labs  Lab 06/22/23 1837 06/23/23 0451 06/24/23 0724 06/25/23 0507 06/25/23 1610 06/25/23 1234 06/25/23 1623 06/25/23 2100 06/26/23 0428 06/26/23 0904  NA 123* 124* 119* 113*   < > 116* 117* 120* 121*  121* 123*  K 3.7 4.1 4.2 4.5  --   --   --   --  5.0  --   CL 95* 95* 90* 87*  --   --   --   --  92*  --   CO2 19* 20* 20* 19*  --   --   --   --  20*  --   GLUCOSE 192* 132* 128* 132*  --   --   --   --  93  --   BUN 23* 24* 26* 33*  --   --   --   --  39*  --   CREATININE 0.76 0.63 0.66 0.78  --   --   --   --  0.90  --   CALCIUM 8.9 8.5* 8.7* 8.5*  --   --   --   --  8.4*  --    < > = values in this interval not displayed.   Liver Function Tests: Recent Labs  Lab  06/23/23 0451 06/24/23 0724  AST 102* 114*  ALT 46* 54*  ALKPHOS 183* 240*  BILITOT 4.0* 5.3*  PROT 5.4* 5.9*  ALBUMIN 2.2* 2.4*   No results for input(s): "LIPASE", "AMYLASE" in the last 168 hours. No results for input(s): "AMMONIA" in the last 168 hours. Coagulation Profile: Recent Labs  Lab 06/25/23 1144  INR 1.5*   CBC: Recent Labs  Lab 06/22/23 0538 06/23/23 0451 06/24/23 0724 06/25/23 0507 06/26/23 0428  WBC 10.0 8.8 10.1 7.3 8.4  HGB 12.1* 11.6* 12.6* 11.9* 10.5*  HCT 35.0* 34.5* 35.3* 33.3* 29.6*  MCV 102.3* 102.1* 100.0 100.3* 101.7*  PLT 83* 47* 74* 62* 42*   Cardiac Enzymes: No results for input(s): "CKTOTAL", "CKMB", "CKMBINDEX", "TROPONINI" in the last 168 hours. BNP: Invalid input(s): "POCBNP" CBG: No results for input(s): "GLUCAP" in the last 168 hours. HbA1C: No results for input(s): "HGBA1C" in the last 72 hours. Urine analysis:    Component Value Date/Time   COLORURINE AMBER (A) 06/25/2023 1549   APPEARANCEUR HAZY (A) 06/25/2023 1549   LABSPEC 1.024 06/25/2023 1549   PHURINE 5.0 06/25/2023 1549   GLUCOSEU NEGATIVE 06/25/2023 1549   HGBUR NEGATIVE 06/25/2023 1549   BILIRUBINUR NEGATIVE 06/25/2023 1549   KETONESUR NEGATIVE 06/25/2023 1549   PROTEINUR NEGATIVE 06/25/2023 1549   NITRITE NEGATIVE 06/25/2023 1549   LEUKOCYTESUR NEGATIVE 06/25/2023 1549   Sepsis Labs: @LABRCNTIP (procalcitonin:4,lacticidven:4) ) Recent Results (from the past 240 hours)  Blood culture (routine x 2)     Status: None   Collection Time: 06/19/23 10:33 PM   Specimen: BLOOD  Result Value Ref Range Status   Specimen Description BLOOD BLOOD LEFT ARM  Final   Special Requests   Final    BOTTLES DRAWN AEROBIC AND ANAEROBIC Blood Culture adequate volume   Culture   Final    NO GROWTH 5 DAYS Performed at [facility], [address], [place], [place] [address]    Report Status 06/24/2023 FINAL  Final  Blood culture (routine x 2)     Status: None   Collection Time:  06/19/23 10:36 PM   Specimen: BLOOD  Result Value Ref Range Status   Specimen Description BLOOD BLOOD LEFT HAND  Final   Special Requests   Final    BOTTLES DRAWN AEROBIC AND ANAEROBIC Blood Culture adequate volume   Culture   Final    NO GROWTH 5 DAYS Performed at [facility], [address], [place], [place] [address]    Report Status 06/24/2023 FINAL  Final  Urine Culture     Status: None   Collection Time: 06/21/23  5:20 PM   Specimen: Urine, Random  Result Value Ref Range Status   Specimen Description   Final    URINE, RANDOM Performed at [facility], [address], [place], [place] [address]    Special Requests   Final    NONE Reflexed from [id number] Performed at [facility], [address], [place], [place] [address]    Culture   Final    NO GROWTH Performed at [facility], [address] [place], [place] [address]    Report Status 06/22/2023 FINAL  Final  Gram stain     Status: None   Collection Time: 06/22/23  4:15 PM   Specimen: Path fluid  Result Value Ref Range Status   Specimen Description FLUID  Final   Special Requests PERIUMBILICAL DRAINAGE  Final   Gram Stain   Final    CYTOSPIN SMEAR NO ORGANISMS SEEN WBC PRESENT,BOTH PMN AND MONONUCLEAR Performed at [facility], [address], [place], [place] [address]    Report Status 06/22/2023 FINAL  Final  Culture, body fluid w Gram Stain-bottle     Status: None   Collection Time: 06/22/23  4:15 PM   Specimen: Path fluid  Result Value Ref Range Status   Specimen Description FLUID 10CC  Final   Special Requests PERIUMBILICAL DRAINAGE  Final   Culture   Final    NO GROWTH 5 DAYS Performed at [facility], [address] [place], [place] [address]    Report Status 06/27/2023 FINAL  Final  MRSA Next Gen by PCR, Nasal     Status: None   Collection Time: 06/25/23  8:00 AM   Specimen: Nasal Mucosa; Nasal Swab  Result Value Ref Range Status   MRSA by PCR Next Gen NOT DETECTED NOT DETECTED Final     Comment: (NOTE) The GeneXpert MRSA Assay (FDA approved for NASAL specimens only), is one component of a comprehensive MRSA colonization surveillance program. It is not intended to diagnose MRSA infection nor to guide or monitor treatment for MRSA infections. Test performance is not FDA approved in patients less than 76 years old. Performed at [facility], [address], [place], [place] [address]      Scheduled Meds:  fentaNYL (SUBLIMAZE) injection  50 mcg Intravenous Q4H   Continuous Infusions:  Procedures/Studies: [place] EKG SITE RITE Result Date: 06/25/2023 If Site Rite image not attached, placement could not be confirmed due to current cardiac rhythm.  DG Chest 2 View Result Date: 06/24/2023 CLINICAL DATA:  Shortness of breath, chest pain. EXAM: CHEST - 2 VIEW COMPARISON:  Chest radiograph dated 06/19/2023. FINDINGS: The heart size and mediastinal contours are within normal limits. Moderate bilateral perihilar interstitial and airspace opacities. No pleural effusion or pneumothorax. The visualized skeletal structures are unremarkable. IMPRESSION: Moderate bilateral perihilar interstitial and airspace opacities may represent pulmonary edema or atypical infection. Electronically Signed   By: Romona Curls M.D.   On: 06/24/2023 12:58   [place] IMAGE GUIDED DRAINAGE BY PERCUTANEOUS CATHETER Result Date: 06/22/2023 INDICATION: Periumbilical collection after hernia repair EXAM: ULTRASOUND-GUIDED ASPIRATION SUBCUTANEOUS COLLECTION MEDICATIONS: No periprocedural antibiotics were indicated ANESTHESIA/SEDATION: Lidocaine 1% subcutaneous COMPLICATIONS: None immediate. PROCEDURE: Informed written consent was obtained from the patient after a thorough discussion of the procedural risks, benefits and alternatives. All questions were addressed. Maximal Sterile Barrier Technique was utilized including caps, mask, sterile gowns, sterile gloves, sterile drape, hand hygiene and skin antiseptic. A timeout was  performed prior to the initiation of the procedure. Survey ultrasound of the region  was performed and an appropriate skin entry site was determined and marked. Region prepped with chlorhexidine, draped in usual sterile fashion, infiltrated locally with 1% lidocaine. 21 gauge princess needle advanced into the collection. 90 mL of thin straw-colored fluid were aspirated, sent for Gram stain and culture. Moderate residual for fluid was identified but aggressive measures for complete evacuation were avoided to minimize any risk of continuing leak at the needle entry site. Needle was removed and a sterile dressing applied. No immediate complication. The patient tolerated the procedure well. IMPRESSION: 1. Technically successful ultrasound-guided aspiration , periumbilical subcutaneous fluid. Sample sent for Gram stain and culture. Electronically Signed   By: Corlis Leak M.D.   On: 06/22/2023 17:06   US Abdomen Limited Result Date: 06/22/2023 CLINICAL DATA:  Umbilical hernia. EXAM: ULTRASOUND ABDOMEN LIMITED COMPARISON:  CT abdomen pelvis 06/19/2023 FINDINGS: The site of the umbilicus there is a 6.7 x 3.0 x 6.6 cm complex fluid collection. No definite herniated fat identified. IMPRESSION: Complex fluid collection at the site of the umbilicus. No definite herniated fat identified. Electronically Signed   By: Annia Belt M.D.   On: 06/22/2023 14:15   [place] ASCITES (ABDOMEN LIMITED) Result Date: 06/20/2023 CLINICAL DATA:  Patient with history of cirrhosis, portal HTN s/p TIPS creation 09/04/22 who is currently admitted with altered mental status and multiple rib fractures. Request for possible paracentesis. EXAM: LIMITED ABDOMEN ULTRASOUND FOR ASCITES TECHNIQUE: Limited ultrasound survey for ascites was performed in all four abdominal quadrants. COMPARISON:  CT abd/pelvis w/contrast 06/18/22. FINDINGS: Small amount of ascites in the right upper quadrant only that is not amenable to paracentesis. IMPRESSION: Small amount of  ascites.  Paracentesis was not performed. Electronically Signed   By: Marliss Coots M.D.   On: 06/20/2023 16:01   CT ABDOMEN PELVIS W CONTRAST Result Date: 06/19/2023 CLINICAL DATA:  Abdominal pain, suicidal ideation EXAM: CT ABDOMEN AND PELVIS WITH CONTRAST TECHNIQUE: Multidetector CT imaging of the abdomen and pelvis was performed using the standard protocol following bolus administration of intravenous contrast. RADIATION DOSE REDUCTION: This exam was performed according to the departmental dose-optimization program which includes automated exposure control, adjustment of the mA and/or kV according to patient size and/or use of iterative reconstruction technique. CONTRAST:  OMNIPAQUE IOHEXOL 300 MG/ML  SOLN COMPARISON:  MRI abdomen dated 01/25/2023. CTA abdomen/pelvis dated 11/10/2022. FINDINGS: Motion degraded images. Lower chest: Mild patchy lingular and left lower lobe opacities, favoring atelectasis. Hepatobiliary: Cirrhosis. TIPS. Embolization coils along the falciform ligament (of a recanalized periumbilical vein) with streak artifact along the liver. Within that constraint, there are no findings suspicious for [facility]. Layering tiny gallstones (series 3/image 30), without associated inflammatory changes. No intrahepatic or extrahepatic duct dilatation. Pancreas: Within normal limits. Spleen: Within the upper limits of normal for size. Adrenals/Urinary Tract: 4.5 cm left adrenal nodule, containing macroscopic fat, compatible with a benign adrenal myelolipoma. Right adrenal glands are within normal limits. Right kidney is within normal limits. Two nonobstructing left lower pole renal calculi measuring up to 2 mm (series 3/image 32). No hydronephrosis. Bladder is within normal limits. Stomach/Bowel: Stomach is within normal limits. No evidence of bowel obstruction. Mild wall thickening involving the descending duodenum (series 3/image 38), correlate for duodenitis. Suspected appendix is within normal  limits (series 3/image 55). No colonic wall thickening or inflammatory changes. Vascular/Lymphatic: No evidence of abdominal aortic aneurysm. Atherosclerotic calcifications of the abdominal aorta and branch vessels, although vessels remain patent. No suspicious abdominopelvic lymphadenopathy. Reproductive: Prostate is grossly unremarkable. Other: Trace pelvic  ascites. Suspected tiny foci of free air overlying the right liver (series 3/images 15 and 20), correlate for recent prior intervention/paracentesis. Musculoskeletal: Multiple subacute/healing bilateral rib fracture deformities. Healed left lateral transverse process deformities at L1-2. No acute fracture is seen. IMPRESSION: Motion degraded images. Multiple subacute/healing bilateral rib fracture deformities. Healed left lateral transverse process deformities at L1-2. No acute fracture is seen. Suspected tiny foci of free air overlying the right liver, correlate for recent prior intervention/paracentesis. Mild wall thickening involving the descending duodenum, correlate for duodenitis. Cirrhosis. TIPS. No findings suspicious for HCC. Additional ancillary findings as above. Electronically Signed   By: Charline Bills M.D.   On: 06/19/2023 22:10   CT Head Wo Contrast Result Date: 06/19/2023 CLINICAL DATA:  Mental status change, unknown cause EXAM: CT HEAD WITHOUT CONTRAST TECHNIQUE: Contiguous axial images were obtained from the base of the skull through the vertex without intravenous contrast. RADIATION DOSE REDUCTION: This exam was performed according to the departmental dose-optimization program which includes automated exposure control, adjustment of the mA and/or kV according to patient size and/or use of iterative reconstruction technique. COMPARISON:  Head CT 04/10/2023 FINDINGS: Brain: No hemorrhage. No hydrocephalus. No extra-axial fluid collection. No mass effect. No mass lesion. No CT evidence of acute cortical infarct. Vascular: No hyperdense  vessel or unexpected calcification. Skull: Normal. Negative for fracture or focal lesion. Sinuses/Orbits: No middle ear or mastoid effusion. Paranasal sinuses are clear. Orbits are unremarkable. Other: None. IMPRESSION: No acute intracranial abnormality. Electronically Signed   By: Lorenza Cambridge M.D.   On: 06/19/2023 21:48   DG Chest Portable 1 View Result Date: 06/19/2023 CLINICAL DATA:  Weakness EXAM: PORTABLE CHEST 1 VIEW COMPARISON:  10/05/2022 FINDINGS: Low lung volumes. Borderline to mild cardiomegaly. Streaky atelectasis or scarring at the left base. No pleural effusion or pneumothorax IMPRESSION: Low lung volumes with streaky atelectasis or scarring at the left base. Electronically Signed   By: Jasmine Pang M.D.   On: 06/19/2023 20:42    Catarina Hartshorn, DO  [facility]  If 7PM-7AM, please contact night-coverage [email or web address] Password [email or web address] 06/28/2023, 6:10 PM   LOS: 9 days

## 2023-06-29 DIAGNOSIS — E7220 Disorder of urea cycle metabolism, unspecified: Secondary | ICD-10-CM | POA: Diagnosis not present

## 2023-06-29 DIAGNOSIS — E43 Unspecified severe protein-calorie malnutrition: Secondary | ICD-10-CM

## 2023-06-29 DIAGNOSIS — R627 Adult failure to thrive: Secondary | ICD-10-CM | POA: Diagnosis not present

## 2023-06-29 DIAGNOSIS — G9341 Metabolic encephalopathy: Secondary | ICD-10-CM | POA: Diagnosis not present

## 2023-06-29 NOTE — Progress Notes (Signed)
Nutrition Brief Note  Chart reviewed. Pt now transitioning to comfort care.  No further nutrition interventions planned at this time.  Please re-consult as needed.   Drusilla Kanner, RDN, LDN Clinical Nutrition

## 2023-06-29 NOTE — Progress Notes (Signed)
PROGRESS NOTE  Alejandro Porter BJY:[id number] DOB: 1975-07-21 DOA: 06/19/2023 PCP: Reather Converse, PA-C  Brief History:  Alejandro Porter is a 48 y.o. male with a history of NASH cirrhosis s/p TIPS who presented to the ED on 06/19/2023 with suicidal thoughts at home. History limited due to encephalopathy. He was tachycardic, afebrile, WBC 14.7k. He was treated for infection, cellulitis, then UTI, increased lactulose and added rifaximin with improvement in mentation. He has, per his brother, a history of cognitive impairment to some degree, and had improved near his baseline. He has not had any evidence of ongoing suicidal ideation, psychiatry cleared him. Hospitalization complicated by urinary retention for which foley was placed, and later severe hyponatremia in the setting of progressive anasarca from decompensated cirrhosis. He was transferred to the ICU, PICC placed, hypertonic saline initiated with steady improvement in sodium level but overall continued clinical decline. Having bleeding due to coagulopathy treated with vitamin K. Palliative care has continued conversations with the patient and family.  Ultimately, patient was transitioned to comfort care measure after palliative medicine conversations.  He is DNR, and in-hospital death is expected.   Assessment/Plan:  Goals of care: Pt is confirmed DNR and would not desire escalation of care further than our current situation. Palliative and medical teams have been in communication with family. --prognosis is limited to hours-days regardless of maximal medical therapy at this time. Given poor level of alertness and suspected inability to manage secretions, minimize oral medications.  -pt has continued to decline despite aggressive medical therapy -palliative medicine conversation with family>>transition to full comfort measures -palliative continues to follow and adjust comfort measures medications   Hyponatremia:  -Has  this chronically related to cirrhosis but worsened after SSRI introduced, since stopped.  -Worsened with reintroduction of lasix as well.  - Improved on hypertonic saline, though underlying issue of intravascular depletion and extravascular overload due to decompensated cirrhosis remains.  -appreciate nephrology -no further labs done as patient has transitioned to full comfort measures   Multifactorial metabolic encephalopathy:  -Hyponatremia, Hyperammonemia likely contributing, though infection not completely ruled out. Note initial concern for RLE cellulitis which is being treated with abx. CT head showed no acute intracranial abnormality. Negative UDS. - Level of orientation initially improved Though he remained disoriented to year and with insight limitation which precludes his capacity to make the decision to leave the hospital against medical advice at this time.  -unfortunate;y, mental status declined again and continued to worsen again despite aggressive medical measures - Limit sedating medications, though his delirium is now limiting patient care, pulling IV's - initially high dose lactulose. Added rifaximin given persistent AMS and ammonia elevation.  - Insufficient fluid for diagnostic paracentesis 1/15, and was covered with antibiotics empirically to cover SBP in addition to his cellulitis as below.    Umbilical hernia, dating back to at least 2022 with complex fluid collection: ?seroma - D/w surgery, Dr. Lovell Sheehan, now s/p 90cc clear yellow fluid drained 1/17 by Dr. Deanne Coffer. Negative gram stain, cell counts not appearing overtly infected, negative culture. Suspect seroma.  -no leukocytosis, no fever   Suicidal ideation, depression: This appears to have been situational and in the setting of encephalopathy. This has durably resolved. He has no thoughts of self harm at this time and no plans.  - Psychiatry consulted, started SSRI (since stopped due to hyponatremia), and prn hydroxyzine.  No suicide precautions required, no psychiatric inpatient admission recommended.  Decompensated NASH cirrhosis:  -MELD score is 28 (19.6% 32-month mortality) - Appeared intravascularly depleted, so diuretics held.  -Restarted due to volume overload but UNa low and intravascularly depleted so stopped lasix again.  - Coagulopathy noted. Tx w/vitamin K. - Given his advanced cirrhosis and failure to thrive with malnutrition, palliative care is consulted for additional support.    Acute urinary retention: Prostate grossly unremarkable on CT which also showed normal right kidney, nonobstructing 2mm stones in left kidney without hydronephrosis. Bladder wnl. Urinalysis with some RBCs, protein and WBCs, hyaline casts. Urine culture negative.  - Continue tamsulosin, foley   Iron deficiency, macrocytic anemia, thrombocytopenia: B12 and folic acid are replete. Suspect this is due to cirrhosis.  - Having lip bleeding, so have to stop lovenox VTE ppx. Use SCDs  - now comfort measures   GERD: Thickening of duodenum suggested by CT. Will keep this in mind without directed treatment at this time.  - Continue PPI--d/c as pt is transitioned to comfort measures   Falls at home, deconditioning: Appears to be failing to thrive. CT revealed multiple bilateral rib fractures in stages of healing.  - PT/OT/RD evaluations appreciated.    AKI: Improved with supportive measures, holding diuretics. CrCl > 39ml/min. -now comfort measures   Prolonged QT interval: at admission.  - Minimize provocative agents   RLE cellulitis:  - Nonpurulent, converted to cefadroxil, clinically much improved, Leukocytosis has resolved. Will stop abx.    Severe protein calorie malnutrition:  - Supplement protein as much as possible.    Recent inguinal hernia repair: Dec 2024 at [facility]. No current issues. ?etiology of dot of free air on CT.              Family Communication:  no Family at bedside   Consultants:   palliative   Code Status:  FULL COMFORT   DVT Prophylaxis:  FULL COMFORT     Procedures: As Listed in Progress Note Above   Antibiotics: None      Subjective: Pt resting comfortably without distress.  No vomiting.  Objective: Vitals:   06/28/23 0047 06/28/23 1754 06/29/23 0556 06/29/23 1436  BP: 112/75 (!) 84/50 (!) 110/55 (!) 57/40  Pulse: 98 88 73 86  Resp: (!) 30 (!) 28 (!) 30   Temp: 97.8 F (36.6 C)  98 F (36.7 C) 97.6 F (36.4 C)  TempSrc: Oral  Oral Axillary  SpO2: (!) 79% (!) 78% (!) 83% 92%  Weight:      Height:        Intake/Output Summary (Last 24 hours) at 06/29/2023 1754 Last data filed at 06/29/2023 0932 Gross per 24 hour  Intake 0 ml  Output 350 ml  Net -350 ml   Weight change:  Exam:  General:  Pt is somnolent,not in acute distress HEENT: [place]/AT Cardiovascular: RRR, S1/S2, no rubs, no gallops Respiratory: scattered rales.   Abdomen: Soft/+BS, non tender,  Extremities: +LE edema, No lymphangitis, No petechiae, No rashes, no synovitis   Data Reviewed: I have personally reviewed following labs and imaging studies Basic Metabolic Panel: Recent Labs  Lab 06/22/23 1837 06/23/23 0451 06/24/23 0724 06/25/23 0507 06/25/23 1610 06/25/23 1234 06/25/23 1623 06/25/23 2100 06/26/23 0428 06/26/23 0904  NA 123* 124* 119* 113*   < > 116* 117* 120* 121*  121* 123*  K 3.7 4.1 4.2 4.5  --   --   --   --  5.0  --   CL 95* 95* 90* 87*  --   --   --   --  92*  --   CO2 19* 20* 20* 19*  --   --   --   --  20*  --   GLUCOSE 192* 132* 128* 132*  --   --   --   --  93  --   BUN 23* 24* 26* 33*  --   --   --   --  39*  --   CREATININE 0.76 0.63 0.66 0.78  --   --   --   --  0.90  --   CALCIUM 8.9 8.5* 8.7* 8.5*  --   --   --   --  8.4*  --    < > = values in this interval not displayed.   Liver Function Tests: Recent Labs  Lab 06/23/23 0451 06/24/23 0724  AST 102* 114*  ALT 46* 54*  ALKPHOS 183* 240*  BILITOT 4.0* 5.3*  PROT 5.4* 5.9*   ALBUMIN 2.2* 2.4*   No results for input(s): "LIPASE", "AMYLASE" in the last 168 hours. No results for input(s): "AMMONIA" in the last 168 hours. Coagulation Profile: Recent Labs  Lab 06/25/23 1144  INR 1.5*   CBC: Recent Labs  Lab 06/23/23 0451 06/24/23 0724 06/25/23 0507 06/26/23 0428  WBC 8.8 10.1 7.3 8.4  HGB 11.6* 12.6* 11.9* 10.5*  HCT 34.5* 35.3* 33.3* 29.6*  MCV 102.1* 100.0 100.3* 101.7*  PLT 47* 74* 62* 42*   Cardiac Enzymes: No results for input(s): "CKTOTAL", "CKMB", "CKMBINDEX", "TROPONINI" in the last 168 hours. BNP: Invalid input(s): "POCBNP" CBG: No results for input(s): "GLUCAP" in the last 168 hours. HbA1C: No results for input(s): "HGBA1C" in the last 72 hours. Urine analysis:    Component Value Date/Time   COLORURINE AMBER (A) 06/25/2023 1549   APPEARANCEUR HAZY (A) 06/25/2023 1549   LABSPEC 1.024 06/25/2023 1549   PHURINE 5.0 06/25/2023 1549   GLUCOSEU NEGATIVE 06/25/2023 1549   HGBUR NEGATIVE 06/25/2023 1549   BILIRUBINUR NEGATIVE 06/25/2023 1549   KETONESUR NEGATIVE 06/25/2023 1549   PROTEINUR NEGATIVE 06/25/2023 1549   NITRITE NEGATIVE 06/25/2023 1549   LEUKOCYTESUR NEGATIVE 06/25/2023 1549   Sepsis Labs: @LABRCNTIP (procalcitonin:4,lacticidven:4) ) Recent Results (from the past 240 hours)  Blood culture (routine x 2)     Status: None   Collection Time: 06/19/23 10:33 PM   Specimen: BLOOD  Result Value Ref Range Status   Specimen Description BLOOD BLOOD LEFT ARM  Final   Special Requests   Final    BOTTLES DRAWN AEROBIC AND ANAEROBIC Blood Culture adequate volume   Culture   Final    NO GROWTH 5 DAYS Performed at [facility], [address], [place], [place] [address]    Report Status 06/24/2023 FINAL  Final  Blood culture (routine x 2)     Status: None   Collection Time: 06/19/23 10:36 PM   Specimen: BLOOD  Result Value Ref Range Status   Specimen Description BLOOD BLOOD LEFT HAND  Final   Special Requests   Final     BOTTLES DRAWN AEROBIC AND ANAEROBIC Blood Culture adequate volume   Culture   Final    NO GROWTH 5 DAYS Performed at [facility], [address], [place], [place] [address]    Report Status 06/24/2023 FINAL  Final  Urine Culture     Status: None   Collection Time: 06/21/23  5:20 PM   Specimen: Urine, Random  Result Value Ref Range Status   Specimen Description   Final    URINE, RANDOM Performed at [facility]  [facility], [address] [place], [place] [address]    Special Requests   Final    NONE Reflexed from [id number] Performed at [facility], [address] [place], [place] [address]    Culture   Final    NO GROWTH Performed at [facility], [address] [place], [place] [address]    Report Status 06/22/2023 FINAL  Final  Gram stain     Status: None   Collection Time: 06/22/23  4:15 PM   Specimen: Path fluid  Result Value Ref Range Status   Specimen Description FLUID  Final   Special Requests PERIUMBILICAL DRAINAGE  Final   Gram Stain   Final    CYTOSPIN SMEAR NO ORGANISMS SEEN WBC PRESENT,BOTH PMN AND MONONUCLEAR Performed at [facility], [address] [place], [place] [address]    Report Status 06/22/2023 FINAL  Final  Culture, body fluid w Gram Stain-bottle     Status: None   Collection Time: 06/22/23  4:15 PM   Specimen: Path fluid  Result Value Ref Range Status   Specimen Description FLUID 10CC  Final   Special Requests PERIUMBILICAL DRAINAGE  Final   Culture   Final    NO GROWTH 5 DAYS Performed at [facility], [address] [place], [place] [address]    Report Status 06/27/2023 FINAL  Final  MRSA Next Gen by PCR, Nasal     Status: None   Collection Time: 06/25/23  8:00 AM   Specimen: Nasal Mucosa; Nasal Swab  Result Value Ref Range Status   MRSA by PCR Next Gen NOT DETECTED NOT DETECTED Final    Comment: (NOTE) The GeneXpert MRSA Assay (FDA approved for NASAL specimens only), is one component of a comprehensive MRSA colonization surveillance program.  It is not intended to diagnose MRSA infection nor to guide or monitor treatment for MRSA infections. Test performance is not FDA approved in patients less than 50 years old. Performed at [facility], [address] [place], [place] [address]      Scheduled Meds:  fentaNYL (SUBLIMAZE) injection  50 mcg Intravenous Q4H   Continuous Infusions:  Procedures/Studies: [place] EKG SITE RITE Result Date: 06/25/2023 If Site Rite image not attached, placement could not be confirmed due to current cardiac rhythm.  DG Chest 2 View Result Date: 06/24/2023 CLINICAL DATA:  Shortness of breath, chest pain. EXAM: CHEST - 2 VIEW COMPARISON:  Chest radiograph dated 06/19/2023. FINDINGS: The heart size and mediastinal contours are within normal limits. Moderate bilateral perihilar interstitial and airspace opacities. No pleural effusion or pneumothorax. The visualized skeletal structures are unremarkable. IMPRESSION: Moderate bilateral perihilar interstitial and airspace opacities may represent pulmonary edema or atypical infection. Electronically Signed   By: Romona Curls M.D.   On: 06/24/2023 12:58   [place] IMAGE GUIDED DRAINAGE BY PERCUTANEOUS CATHETER Result Date: 06/22/2023 INDICATION: Periumbilical collection after hernia repair EXAM: ULTRASOUND-GUIDED ASPIRATION SUBCUTANEOUS COLLECTION MEDICATIONS: No periprocedural antibiotics were indicated ANESTHESIA/SEDATION: Lidocaine 1% subcutaneous COMPLICATIONS: None immediate. PROCEDURE: Informed written consent was obtained from the patient after a thorough discussion of the procedural risks, benefits and alternatives. All questions were addressed. Maximal Sterile Barrier Technique was utilized including caps, mask, sterile gowns, sterile gloves, sterile drape, hand hygiene and skin antiseptic. A timeout was performed prior to the initiation of the procedure. Survey ultrasound of the region was performed and an appropriate skin entry site was determined and marked. Region  prepped with chlorhexidine, draped in usual sterile fashion, infiltrated locally with 1% lidocaine. 21 gauge  princess needle advanced into the collection. 90 mL of thin straw-colored fluid were aspirated, sent for Gram stain and culture. Moderate residual for fluid was identified but aggressive measures for complete evacuation were avoided to minimize any risk of continuing leak at the needle entry site. Needle was removed and a sterile dressing applied. No immediate complication. The patient tolerated the procedure well. IMPRESSION: 1. Technically successful ultrasound-guided aspiration , periumbilical subcutaneous fluid. Sample sent for Gram stain and culture. Electronically Signed   By: Corlis Leak M.D.   On: 06/22/2023 17:06   US Abdomen Limited Result Date: 06/22/2023 CLINICAL DATA:  Umbilical hernia. EXAM: ULTRASOUND ABDOMEN LIMITED COMPARISON:  CT abdomen pelvis 06/19/2023 FINDINGS: The site of the umbilicus there is a 6.7 x 3.0 x 6.6 cm complex fluid collection. No definite herniated fat identified. IMPRESSION: Complex fluid collection at the site of the umbilicus. No definite herniated fat identified. Electronically Signed   By: Annia Belt M.D.   On: 06/22/2023 14:15   [place] ASCITES (ABDOMEN LIMITED) Result Date: 06/20/2023 CLINICAL DATA:  Patient with history of cirrhosis, portal HTN s/p TIPS creation 09/04/22 who is currently admitted with altered mental status and multiple rib fractures. Request for possible paracentesis. EXAM: LIMITED ABDOMEN ULTRASOUND FOR ASCITES TECHNIQUE: Limited ultrasound survey for ascites was performed in all four abdominal quadrants. COMPARISON:  CT abd/pelvis w/contrast 06/18/22. FINDINGS: Small amount of ascites in the right upper quadrant only that is not amenable to paracentesis. IMPRESSION: Small amount of ascites.  Paracentesis was not performed. Electronically Signed   By: Marliss Coots M.D.   On: 06/20/2023 16:01   CT ABDOMEN PELVIS W CONTRAST Result Date:  06/19/2023 CLINICAL DATA:  Abdominal pain, suicidal ideation EXAM: CT ABDOMEN AND PELVIS WITH CONTRAST TECHNIQUE: Multidetector CT imaging of the abdomen and pelvis was performed using the standard protocol following bolus administration of intravenous contrast. RADIATION DOSE REDUCTION: This exam was performed according to the departmental dose-optimization program which includes automated exposure control, adjustment of the mA and/or kV according to patient size and/or use of iterative reconstruction technique. CONTRAST:  OMNIPAQUE IOHEXOL 300 MG/ML  SOLN COMPARISON:  MRI abdomen dated 01/25/2023. CTA abdomen/pelvis dated 11/10/2022. FINDINGS: Motion degraded images. Lower chest: Mild patchy lingular and left lower lobe opacities, favoring atelectasis. Hepatobiliary: Cirrhosis. TIPS. Embolization coils along the falciform ligament (of a recanalized periumbilical vein) with streak artifact along the liver. Within that constraint, there are no findings suspicious for [facility]. Layering tiny gallstones (series 3/image 30), without associated inflammatory changes. No intrahepatic or extrahepatic duct dilatation. Pancreas: Within normal limits. Spleen: Within the upper limits of normal for size. Adrenals/Urinary Tract: 4.5 cm left adrenal nodule, containing macroscopic fat, compatible with a benign adrenal myelolipoma. Right adrenal glands are within normal limits. Right kidney is within normal limits. Two nonobstructing left lower pole renal calculi measuring up to 2 mm (series 3/image 32). No hydronephrosis. Bladder is within normal limits. Stomach/Bowel: Stomach is within normal limits. No evidence of bowel obstruction. Mild wall thickening involving the descending duodenum (series 3/image 38), correlate for duodenitis. Suspected appendix is within normal limits (series 3/image 55). No colonic wall thickening or inflammatory changes. Vascular/Lymphatic: No evidence of abdominal aortic aneurysm. Atherosclerotic  calcifications of the abdominal aorta and branch vessels, although vessels remain patent. No suspicious abdominopelvic lymphadenopathy. Reproductive: Prostate is grossly unremarkable. Other: Trace pelvic ascites. Suspected tiny foci of free air overlying the right liver (series 3/images 15 and 20), correlate for recent prior intervention/paracentesis. Musculoskeletal: Multiple subacute/healing bilateral rib fracture deformities.  Healed left lateral transverse process deformities at L1-2. No acute fracture is seen. IMPRESSION: Motion degraded images. Multiple subacute/healing bilateral rib fracture deformities. Healed left lateral transverse process deformities at L1-2. No acute fracture is seen. Suspected tiny foci of free air overlying the right liver, correlate for recent prior intervention/paracentesis. Mild wall thickening involving the descending duodenum, correlate for duodenitis. Cirrhosis. TIPS. No findings suspicious for HCC. Additional ancillary findings as above. Electronically Signed   By: Charline Bills M.D.   On: 06/19/2023 22:10   CT Head Wo Contrast Result Date: 06/19/2023 CLINICAL DATA:  Mental status change, unknown cause EXAM: CT HEAD WITHOUT CONTRAST TECHNIQUE: Contiguous axial images were obtained from the base of the skull through the vertex without intravenous contrast. RADIATION DOSE REDUCTION: This exam was performed according to the departmental dose-optimization program which includes automated exposure control, adjustment of the mA and/or kV according to patient size and/or use of iterative reconstruction technique. COMPARISON:  Head CT 04/10/2023 FINDINGS: Brain: No hemorrhage. No hydrocephalus. No extra-axial fluid collection. No mass effect. No mass lesion. No CT evidence of acute cortical infarct. Vascular: No hyperdense vessel or unexpected calcification. Skull: Normal. Negative for fracture or focal lesion. Sinuses/Orbits: No middle ear or mastoid effusion. Paranasal sinuses  are clear. Orbits are unremarkable. Other: None. IMPRESSION: No acute intracranial abnormality. Electronically Signed   By: Lorenza Cambridge M.D.   On: 06/19/2023 21:48   DG Chest Portable 1 View Result Date: 06/19/2023 CLINICAL DATA:  Weakness EXAM: PORTABLE CHEST 1 VIEW COMPARISON:  10/05/2022 FINDINGS: Low lung volumes. Borderline to mild cardiomegaly. Streaky atelectasis or scarring at the left base. No pleural effusion or pneumothorax IMPRESSION: Low lung volumes with streaky atelectasis or scarring at the left base. Electronically Signed   By: Jasmine Pang M.D.   On: 06/19/2023 20:42    Catarina Hartshorn, DO  [facility]  If 7PM-7AM, please contact night-coverage [email or web address] Password [facility] 06/29/2023, 5:54 PM   LOS: 10 days

## 2023-06-29 NOTE — Plan of Care (Signed)
  Problem: Clinical Measurements: Goal: Ability to maintain clinical measurements within normal limits will improve Outcome: Not Applicable Goal: Will remain free from infection Outcome: Not Applicable

## 2023-06-29 NOTE — Progress Notes (Signed)
Patient remains on comfort care, scheduled dilaudid given through the night. Patient presented with some signs of anxiety, PRN ativan given, medication effective. Patient presenting with excessive secretions, oral care provided. Patient given bed bath with peri and foley care provided. Patient resting in bed at this time comfortably.

## 2023-06-30 DIAGNOSIS — D696 Thrombocytopenia, unspecified: Secondary | ICD-10-CM | POA: Diagnosis not present

## 2023-06-30 DIAGNOSIS — L03115 Cellulitis of right lower limb: Secondary | ICD-10-CM | POA: Diagnosis not present

## 2023-06-30 DIAGNOSIS — E7220 Disorder of urea cycle metabolism, unspecified: Secondary | ICD-10-CM | POA: Diagnosis not present

## 2023-06-30 DIAGNOSIS — G9341 Metabolic encephalopathy: Secondary | ICD-10-CM | POA: Diagnosis not present

## 2023-06-30 NOTE — Progress Notes (Signed)
PROGRESS NOTE  Elic Vencill ZOX:[id number] DOB: [birth year] DOA: 06/19/2023 PCP: Reather Converse, PA-C  Brief History:  Alejandro Porter is a 48 y.o. male with a history of NASH cirrhosis s/p TIPS who presented to the ED on 06/19/2023 with suicidal thoughts at home. History limited due to encephalopathy. He was tachycardic, afebrile, WBC 14.7k. He was treated for infection, cellulitis, then UTI, increased lactulose and added rifaximin with improvement in mentation. He has, per his brother, a history of cognitive impairment to some degree, and had improved near his baseline. He has not had any evidence of ongoing suicidal ideation, psychiatry cleared him. Hospitalization complicated by urinary retention for which foley was placed, and later severe hyponatremia in the setting of progressive anasarca from decompensated cirrhosis. He was transferred to the ICU, PICC placed, hypertonic saline initiated with steady improvement in sodium level but overall continued clinical decline. Having bleeding due to coagulopathy treated with vitamin K. Palliative care has continued conversations with the patient and family.  Ultimately, patient was transitioned to comfort care measure after palliative medicine conversations.  He is DNR, and in-hospital death is expected.   Assessment/Plan: Goals of care: Pt is confirmed DNR and would not desire escalation of care further than our current situation. Palliative and medical teams have been in communication with family. --prognosis is limited to hours-days regardless of maximal medical therapy at this time. Given poor level of alertness and suspected inability to manage secretions, minimize oral medications.  -pt has continued to decline despite aggressive medical therapy -palliative medicine conversation with family>>transition to full comfort measures -medications for comfort measure continue to be optimized to minimize patient discomfort and  suffering   Hyponatremia:  -Has this chronically related to cirrhosis but worsened after SSRI introduced, since stopped.  -Worsened with reintroduction of lasix as well.  - Improved on hypertonic saline, though underlying issue of intravascular depletion and extravascular overload due to decompensated cirrhosis remains.  -appreciate nephrology -no further labs done as patient has transitioned to full comfort measures   Multifactorial metabolic encephalopathy:  -Hyponatremia, Hyperammonemia likely contributing, though infection not completely ruled out. Note initial concern for RLE cellulitis which is being treated with abx. CT head showed no acute intracranial abnormality. Negative UDS. - Level of orientation initially improved Though he remained disoriented to year and with insight limitation which precludes his capacity to make the decision to leave the hospital against medical advice at this time.  -unfortunate;y, mental status declined again and continued to worsen again despite aggressive medical measures - Limit sedating medications, though his delirium is now limiting patient care, pulling IV's - initially high dose lactulose. Added rifaximin given persistent AMS and ammonia elevation.  - Insufficient fluid for diagnostic paracentesis 1/15, and was covered with antibiotics empirically to cover SBP in addition to his cellulitis as below.    Umbilical hernia, dating back to at least 2022 with complex fluid collection: ?seroma - D/w surgery, Dr. Lovell Sheehan, now s/p 90cc clear yellow fluid drained 1/17 by Dr. Deanne Coffer. Negative gram stain, cell counts not appearing overtly infected, negative culture. Suspect seroma.  -no leukocytosis, no fever   Suicidal ideation, depression: This appears to have been situational and in the setting of encephalopathy. This has durably resolved. He has no thoughts of self harm at this time and no plans.  - Psychiatry consulted, started SSRI (since stopped due to  hyponatremia), and prn hydroxyzine. No suicide precautions required, no psychiatric inpatient admission recommended.  Decompensated NASH cirrhosis:  -MELD score is 28 (19.6% 5-month mortality) - Appeared intravascularly depleted, so diuretics held.  -Restarted due to volume overload but UNa low and intravascularly depleted so stopped lasix again.  - Coagulopathy noted. Tx w/vitamin K. - Given his advanced cirrhosis and failure to thrive with malnutrition, palliative care is consulted for additional support.    Acute urinary retention: Prostate grossly unremarkable on CT which also showed normal right kidney, nonobstructing 2mm stones in left kidney without hydronephrosis. Bladder wnl. Urinalysis with some RBCs, protein and WBCs, hyaline casts. Urine culture negative.  - Continue tamsulosin, foley   Iron deficiency, macrocytic anemia, thrombocytopenia: B12 and folic acid are replete. Suspect this is due to cirrhosis.  - Having lip bleeding, so have to stop lovenox VTE ppx. Use SCDs  - now comfort measures   GERD: Thickening of duodenum suggested by CT. Will keep this in mind without directed treatment at this time.  - Continue PPI--d/c as pt is transitioned to comfort measures   Falls at home, deconditioning: Appears to be failing to thrive. CT revealed multiple bilateral rib fractures in stages of healing.  - PT/OT/RD evaluations appreciated.    AKI: Improved with supportive measures, holding diuretics. CrCl > 83ml/min. -now comfort measures   Prolonged QT interval: at admission.  - Minimize provocative agents   RLE cellulitis:  - Nonpurulent, converted to cefadroxil, clinically much improved, Leukocytosis has resolved. Will stop abx.    Severe protein calorie malnutrition:  - Supplement protein as much as possible.    Recent inguinal hernia repair: Dec 2024 at [facility]. No current issues. ?etiology of dot of free air on CT.              Family Communication:  updated  brother and cousin 06/30/23   Consultants:  palliative   Code Status:  FULL COMFORT   DVT Prophylaxis:  FULL COMFORT     Procedures: As Listed in Progress Note Above   Antibiotics: None         Subjective: Pt resting comfortably, withdraws to tactile stimuli.  No vomiting, no uncontrolled pain or resp distrestt  Objective: Vitals:   06/29/23 0556 06/29/23 1436 06/29/23 2020 06/30/23 0918  BP: (!) 110/55 (!) 57/40 116/70 124/80  Pulse: 73 86 94 97  Resp: (!) 30  (!) 26 (!) 24  Temp: 98 F (36.7 C) 97.6 F (36.4 C) 97.7 F (36.5 C)   TempSrc: Oral Axillary Axillary   SpO2: (!) 83% 92% 90%   Weight:      Height:        Intake/Output Summary (Last 24 hours) at 06/30/2023 1639 Last data filed at 06/30/2023 0930 Gross per 24 hour  Intake 0 ml  Output --  Net 0 ml   Weight change:  Exam:  General:  Pt is somnolent not in acute distress HEENT: No neck mass, /AT Cardiovascular: RRR, S1/S2 Respiratory: bilateral rhonchi Abdomen: Soft/+BS, non distended, no guarding Extremities: 2 + LE edema, No lymphangitis, No petechiae, No rashes, no synovitis   Data Reviewed: I have personally reviewed following labs and imaging studies Basic Metabolic Panel: Recent Labs  Lab 06/24/23 0724 06/25/23 0507 06/25/23 0842 06/25/23 1234 06/25/23 1623 06/25/23 2100 06/26/23 0428 06/26/23 0904  NA 119* 113*   < > 116* 117* 120* 121*  121* 123*  K 4.2 4.5  --   --   --   --  5.0  --   CL 90* 87*  --   --   --   --  92*  --   CO2 20* 19*  --   --   --   --  20*  --   GLUCOSE 128* 132*  --   --   --   --  93  --   BUN 26* 33*  --   --   --   --  39*  --   CREATININE 0.66 0.78  --   --   --   --  0.90  --   CALCIUM 8.7* 8.5*  --   --   --   --  8.4*  --    < > = values in this interval not displayed.   Liver Function Tests: Recent Labs  Lab 06/24/23 0724  AST 114*  ALT 54*  ALKPHOS 240*  BILITOT 5.3*  PROT 5.9*  ALBUMIN 2.4*   No results for input(s): "LIPASE",  "AMYLASE" in the last 168 hours. No results for input(s): "AMMONIA" in the last 168 hours. Coagulation Profile: Recent Labs  Lab 06/25/23 1144  INR 1.5*   CBC: Recent Labs  Lab 06/24/23 0724 06/25/23 0507 06/26/23 0428  WBC 10.1 7.3 8.4  HGB 12.6* 11.9* 10.5*  HCT 35.3* 33.3* 29.6*  MCV 100.0 100.3* 101.7*  PLT 74* 62* 42*   Cardiac Enzymes: No results for input(s): "CKTOTAL", "CKMB", "CKMBINDEX", "TROPONINI" in the last 168 hours. BNP: Invalid input(s): "POCBNP" CBG: No results for input(s): "GLUCAP" in the last 168 hours. HbA1C: No results for input(s): "HGBA1C" in the last 72 hours. Urine analysis:    Component Value Date/Time   COLORURINE AMBER (A) 06/25/2023 1549   APPEARANCEUR HAZY (A) 06/25/2023 1549   LABSPEC 1.024 06/25/2023 1549   PHURINE 5.0 06/25/2023 1549   GLUCOSEU NEGATIVE 06/25/2023 1549   HGBUR NEGATIVE 06/25/2023 1549   BILIRUBINUR NEGATIVE 06/25/2023 1549   KETONESUR NEGATIVE 06/25/2023 1549   PROTEINUR NEGATIVE 06/25/2023 1549   NITRITE NEGATIVE 06/25/2023 1549   LEUKOCYTESUR NEGATIVE 06/25/2023 1549   Sepsis Labs: @LABRCNTIP (procalcitonin:4,lacticidven:4) ) Recent Results (from the past 240 hours)  Urine Culture     Status: None   Collection Time: 06/21/23  5:20 PM   Specimen: Urine, Random  Result Value Ref Range Status   Specimen Description   Final    URINE, RANDOM Performed at [facility], [address], [place], [place] [address]    Special Requests   Final    NONE Reflexed from [id number] Performed at [facility], [address] [place], [place] [address]    Culture   Final    NO GROWTH Performed at [facility], [address], [place], [place] [address]    Report Status 06/22/2023 FINAL  Final  Gram stain     Status: None   Collection Time: 06/22/23  4:15 PM   Specimen: Path fluid  Result Value Ref Range Status   Specimen Description FLUID  Final   Special Requests PERIUMBILICAL DRAINAGE  Final   Gram Stain   Final     CYTOSPIN SMEAR NO ORGANISMS SEEN WBC PRESENT,BOTH PMN AND MONONUCLEAR Performed at [facility], [address], [place], [place] [address]    Report Status 06/22/2023 FINAL  Final  Culture, body fluid w Gram Stain-bottle     Status: None   Collection Time: 06/22/23  4:15 PM   Specimen: Path fluid  Result Value Ref Range Status   Specimen Description FLUID 10CC  Final   Special Requests PERIUMBILICAL DRAINAGE  Final   Culture   Final  NO GROWTH 5 DAYS Performed at [facility], [address], [place], [place] [address]    Report Status 06/27/2023 FINAL  Final  MRSA Next Gen by PCR, Nasal     Status: None   Collection Time: 06/25/23  8:00 AM   Specimen: Nasal Mucosa; Nasal Swab  Result Value Ref Range Status   MRSA by PCR Next Gen NOT DETECTED NOT DETECTED Final    Comment: (NOTE) The GeneXpert MRSA Assay (FDA approved for NASAL specimens only), is one component of a comprehensive MRSA colonization surveillance program. It is not intended to diagnose MRSA infection nor to guide or monitor treatment for MRSA infections. Test performance is not FDA approved in patients less than 27 years old. Performed at [facility], [address], [place], [place] [address]      Scheduled Meds:  fentaNYL (SUBLIMAZE) injection  50 mcg Intravenous Q4H   Continuous Infusions:  Procedures/Studies: [place] EKG SITE RITE Result Date: 06/25/2023 If Site Rite image not attached, placement could not be confirmed due to current cardiac rhythm.  DG Chest 2 View Result Date: 06/24/2023 CLINICAL DATA:  Shortness of breath, chest pain. EXAM: CHEST - 2 VIEW COMPARISON:  Chest radiograph dated 06/19/2023. FINDINGS: The heart size and mediastinal contours are within normal limits. Moderate bilateral perihilar interstitial and airspace opacities. No pleural effusion or pneumothorax. The visualized skeletal structures are unremarkable. IMPRESSION: Moderate bilateral perihilar interstitial and airspace  opacities may represent pulmonary edema or atypical infection. Electronically Signed   By: Romona Curls M.D.   On: 06/24/2023 12:58   [place] IMAGE GUIDED DRAINAGE BY PERCUTANEOUS CATHETER Result Date: 06/22/2023 INDICATION: Periumbilical collection after hernia repair EXAM: ULTRASOUND-GUIDED ASPIRATION SUBCUTANEOUS COLLECTION MEDICATIONS: No periprocedural antibiotics were indicated ANESTHESIA/SEDATION: Lidocaine 1% subcutaneous COMPLICATIONS: None immediate. PROCEDURE: Informed written consent was obtained from the patient after a thorough discussion of the procedural risks, benefits and alternatives. All questions were addressed. Maximal Sterile Barrier Technique was utilized including caps, mask, sterile gowns, sterile gloves, sterile drape, hand hygiene and skin antiseptic. A timeout was performed prior to the initiation of the procedure. Survey ultrasound of the region was performed and an appropriate skin entry site was determined and marked. Region prepped with chlorhexidine, draped in usual sterile fashion, infiltrated locally with 1% lidocaine. 21 gauge princess needle advanced into the collection. 90 mL of thin straw-colored fluid were aspirated, sent for Gram stain and culture. Moderate residual for fluid was identified but aggressive measures for complete evacuation were avoided to minimize any risk of continuing leak at the needle entry site. Needle was removed and a sterile dressing applied. No immediate complication. The patient tolerated the procedure well. IMPRESSION: 1. Technically successful ultrasound-guided aspiration , periumbilical subcutaneous fluid. Sample sent for Gram stain and culture. Electronically Signed   By: Corlis Leak M.D.   On: 06/22/2023 17:06   US Abdomen Limited Result Date: 06/22/2023 CLINICAL DATA:  Umbilical hernia. EXAM: ULTRASOUND ABDOMEN LIMITED COMPARISON:  CT abdomen pelvis 06/19/2023 FINDINGS: The site of the umbilicus there is a 6.7 x 3.0 x 6.6 cm complex fluid  collection. No definite herniated fat identified. IMPRESSION: Complex fluid collection at the site of the umbilicus. No definite herniated fat identified. Electronically Signed   By: Annia Belt M.D.   On: 06/22/2023 14:15   [place] ASCITES (ABDOMEN LIMITED) Result Date: 06/20/2023 CLINICAL DATA:  Patient with history of cirrhosis, portal HTN s/p TIPS creation 09/04/22 who is currently admitted with altered mental status and multiple rib fractures. Request for possible paracentesis.  EXAM: LIMITED ABDOMEN ULTRASOUND FOR ASCITES TECHNIQUE: Limited ultrasound survey for ascites was performed in all four abdominal quadrants. COMPARISON:  CT abd/pelvis w/contrast 06/18/22. FINDINGS: Small amount of ascites in the right upper quadrant only that is not amenable to paracentesis. IMPRESSION: Small amount of ascites.  Paracentesis was not performed. Electronically Signed   By: Marliss Coots M.D.   On: 06/20/2023 16:01   CT ABDOMEN PELVIS W CONTRAST Result Date: 06/19/2023 CLINICAL DATA:  Abdominal pain, suicidal ideation EXAM: CT ABDOMEN AND PELVIS WITH CONTRAST TECHNIQUE: Multidetector CT imaging of the abdomen and pelvis was performed using the standard protocol following bolus administration of intravenous contrast. RADIATION DOSE REDUCTION: This exam was performed according to the departmental dose-optimization program which includes automated exposure control, adjustment of the mA and/or kV according to patient size and/or use of iterative reconstruction technique. CONTRAST:  OMNIPAQUE IOHEXOL 300 MG/ML  SOLN COMPARISON:  MRI abdomen dated 01/25/2023. CTA abdomen/pelvis dated 11/10/2022. FINDINGS: Motion degraded images. Lower chest: Mild patchy lingular and left lower lobe opacities, favoring atelectasis. Hepatobiliary: Cirrhosis. TIPS. Embolization coils along the falciform ligament (of a recanalized periumbilical vein) with streak artifact along the liver. Within that constraint, there are no findings suspicious  for [facility]. Layering tiny gallstones (series 3/image 30), without associated inflammatory changes. No intrahepatic or extrahepatic duct dilatation. Pancreas: Within normal limits. Spleen: Within the upper limits of normal for size. Adrenals/Urinary Tract: 4.5 cm left adrenal nodule, containing macroscopic fat, compatible with a benign adrenal myelolipoma. Right adrenal glands are within normal limits. Right kidney is within normal limits. Two nonobstructing left lower pole renal calculi measuring up to 2 mm (series 3/image 32). No hydronephrosis. Bladder is within normal limits. Stomach/Bowel: Stomach is within normal limits. No evidence of bowel obstruction. Mild wall thickening involving the descending duodenum (series 3/image 38), correlate for duodenitis. Suspected appendix is within normal limits (series 3/image 55). No colonic wall thickening or inflammatory changes. Vascular/Lymphatic: No evidence of abdominal aortic aneurysm. Atherosclerotic calcifications of the abdominal aorta and branch vessels, although vessels remain patent. No suspicious abdominopelvic lymphadenopathy. Reproductive: Prostate is grossly unremarkable. Other: Trace pelvic ascites. Suspected tiny foci of free air overlying the right liver (series 3/images 15 and 20), correlate for recent prior intervention/paracentesis. Musculoskeletal: Multiple subacute/healing bilateral rib fracture deformities. Healed left lateral transverse process deformities at L1-2. No acute fracture is seen. IMPRESSION: Motion degraded images. Multiple subacute/healing bilateral rib fracture deformities. Healed left lateral transverse process deformities at L1-2. No acute fracture is seen. Suspected tiny foci of free air overlying the right liver, correlate for recent prior intervention/paracentesis. Mild wall thickening involving the descending duodenum, correlate for duodenitis. Cirrhosis. TIPS. No findings suspicious for HCC. Additional ancillary findings as above.  Electronically Signed   By: Charline Bills M.D.   On: 06/19/2023 22:10   CT Head Wo Contrast Result Date: 06/19/2023 CLINICAL DATA:  Mental status change, unknown cause EXAM: CT HEAD WITHOUT CONTRAST TECHNIQUE: Contiguous axial images were obtained from the base of the skull through the vertex without intravenous contrast. RADIATION DOSE REDUCTION: This exam was performed according to the departmental dose-optimization program which includes automated exposure control, adjustment of the mA and/or kV according to patient size and/or use of iterative reconstruction technique. COMPARISON:  Head CT 04/10/2023 FINDINGS: Brain: No hemorrhage. No hydrocephalus. No extra-axial fluid collection. No mass effect. No mass lesion. No CT evidence of acute cortical infarct. Vascular: No hyperdense vessel or unexpected calcification. Skull: Normal. Negative for fracture or focal lesion. Sinuses/Orbits: No middle ear  or mastoid effusion. Paranasal sinuses are clear. Orbits are unremarkable. Other: None. IMPRESSION: No acute intracranial abnormality. Electronically Signed   By: Lorenza Cambridge M.D.   On: 06/19/2023 21:48   DG Chest Portable 1 View Result Date: 06/19/2023 CLINICAL DATA:  Weakness EXAM: PORTABLE CHEST 1 VIEW COMPARISON:  10/05/2022 FINDINGS: Low lung volumes. Borderline to mild cardiomegaly. Streaky atelectasis or scarring at the left base. No pleural effusion or pneumothorax IMPRESSION: Low lung volumes with streaky atelectasis or scarring at the left base. Electronically Signed   By: Jasmine Pang M.D.   On: 06/19/2023 20:42    Catarina Hartshorn, DO  [facility]  If 7PM-7AM, please contact night-coverage [email or web address] Password [facility] 06/30/2023, 4:39 PM   LOS: 11 days

## 2023-07-01 DIAGNOSIS — L03115 Cellulitis of right lower limb: Secondary | ICD-10-CM | POA: Diagnosis not present

## 2023-07-01 DIAGNOSIS — R627 Adult failure to thrive: Secondary | ICD-10-CM | POA: Diagnosis not present

## 2023-07-01 DIAGNOSIS — N179 Acute kidney failure, unspecified: Secondary | ICD-10-CM | POA: Diagnosis not present

## 2023-07-01 DIAGNOSIS — G9341 Metabolic encephalopathy: Secondary | ICD-10-CM | POA: Diagnosis not present

## 2023-07-01 NOTE — Plan of Care (Signed)
Problem: Activity: Goal: Risk for activity intolerance will decrease Outcome: Not Progressing

## 2023-07-01 NOTE — Progress Notes (Signed)
Chaplain is grateful for the collaborative conversation with Alejandro Porter, mining. Nurse shared that Alejandro Porter is actively dying and comfort care measures have begun. Alejandro Porter at time of visit was not in a place to engage conversation but did respond with head turn and relaxed breath to soothing vocal song offered by Chaplain and a reading of Psalm and prayer. Chaplain provided ministry of comforting presence and words of encouragement to nursing staff.  Chaplain -RevClifton Custard L. Andrey Campanile

## 2023-07-01 NOTE — Progress Notes (Signed)
Patient turned and repositioned this shift, periods of agitation relieved with PRN ativan and fentanyl. Respirations are rigid and uneven , patient restless at times. HOB at 45 degrees. Will continue to monitor and keep patient comfortable

## 2023-07-01 NOTE — Progress Notes (Signed)
PROGRESS NOTE  Alejandro Porter ZOX:[id number] DOB: [birth year] DOA: 06/19/2023 PCP: Reather Converse, PA-C  Brief History:  Alejandro Porter is a 48 y.o. male with a history of NASH cirrhosis s/p TIPS who presented to the ED on 06/19/2023 with suicidal thoughts at home. History limited due to encephalopathy. He was tachycardic, afebrile, WBC 14.7k. He was treated for infection, cellulitis, then UTI, increased lactulose and added rifaximin with improvement in mentation. He has, per his brother, a history of cognitive impairment to some degree, and had improved near his baseline. He has not had any evidence of ongoing suicidal ideation, psychiatry cleared him. Hospitalization complicated by urinary retention for which foley was placed, and later severe hyponatremia in the setting of progressive anasarca from decompensated cirrhosis. He was transferred to the ICU, PICC placed, hypertonic saline initiated with steady improvement in sodium level but overall continued clinical decline. Having bleeding due to coagulopathy treated with vitamin K. Palliative care has continued conversations with the patient and family.  Ultimately, patient was transitioned to comfort care measure after palliative medicine conversations.  His comfort measure medications were monitored and adjusted to optimized symptom management.   Assessment/Plan: Goals of care: Pt is confirmed DNR and would not desire escalation of care further than our current situation. Palliative and medical teams have been in communication with family. --prognosis is limited to hours-days regardless of maximal medical therapy at this time. Given poor level of alertness and suspected inability to manage secretions, minimize oral medications.  -pt has continued to decline despite aggressive medical therapy -palliative medicine conversation with family>>transition to full comfort measures -medications for comfort measure continue to be  optimized to minimize patient discomfort and suffering   Hyponatremia:  -Has this chronically related to cirrhosis but worsened after SSRI introduced, since stopped.  -Worsened with reintroduction of lasix as well.  - Improved on hypertonic saline, though underlying issue of intravascular depletion and extravascular overload due to decompensated cirrhosis remains.  -appreciate nephrology -no further labs done as patient has transitioned to full comfort measures   Multifactorial metabolic encephalopathy:  -Hyponatremia, Hyperammonemia likely contributing, though infection not completely ruled out. Note initial concern for RLE cellulitis which is being treated with abx. CT head showed no acute intracranial abnormality. Negative UDS. - Level of orientation initially improved Though he remained disoriented to year and with insight limitation which precludes his capacity to make the decision to leave the hospital against medical advice at this time.  -unfortunate;y, mental status declined again and continued to worsen again despite aggressive medical measures - Limit sedating medications, though his delirium is now limiting patient care, pulling IV's - initially high dose lactulose. Added rifaximin given persistent AMS and ammonia elevation.  - Insufficient fluid for diagnostic paracentesis 1/15, and was covered with antibiotics empirically to cover SBP in addition to his cellulitis as below.    Umbilical hernia, dating back to at least 2022 with complex fluid collection: ?seroma - D/w surgery, Dr. Lovell Sheehan, now s/p 90cc clear yellow fluid drained 1/17 by Dr. Deanne Coffer. Negative gram stain, cell counts not appearing overtly infected, negative culture. Suspect seroma.  -no leukocytosis, no fever   Suicidal ideation, depression: This appears to have been situational and in the setting of encephalopathy. This has durably resolved. He has no thoughts of self harm at this time and no plans.  - Psychiatry  consulted, started SSRI (since stopped due to hyponatremia), and prn hydroxyzine. No suicide precautions required, no  psychiatric inpatient admission recommended.     Decompensated NASH cirrhosis:  -MELD score is 28 (19.6% 12-month mortality) - Appeared intravascularly depleted, so diuretics held.  -Restarted due to volume overload but UNa low and intravascularly depleted so stopped lasix again.  - Coagulopathy noted. Tx w/vitamin K. - Given his advanced cirrhosis and failure to thrive with malnutrition, palliative care is consulted for additional support.    Acute urinary retention: Prostate grossly unremarkable on CT which also showed normal right kidney, nonobstructing 2mm stones in left kidney without hydronephrosis. Bladder wnl. Urinalysis with some RBCs, protein and WBCs, hyaline casts. Urine culture negative.  - Continue tamsulosin, foley   Iron deficiency, macrocytic anemia, thrombocytopenia: B12 and folic acid are replete. Suspect this is due to cirrhosis.  - Having lip bleeding, so have to stop lovenox VTE ppx. Use SCDs  - now comfort measures   GERD: Thickening of duodenum suggested by CT. Will keep this in mind without directed treatment at this time.  - Continue PPI--d/c as pt is transitioned to comfort measures   Falls at home, deconditioning: Appears to be failing to thrive. CT revealed multiple bilateral rib fractures in stages of healing.  - PT/OT/RD evaluations appreciated.    AKI: Improved with supportive measures, holding diuretics. CrCl > 46ml/min. -now comfort measures   Prolonged QT interval: at admission.  - Minimize provocative agents   RLE cellulitis:  - Nonpurulent, converted to cefadroxil, clinically much improved, Leukocytosis has resolved. Will stop abx.    Severe protein calorie malnutrition:  - Supplement protein as much as possible.    Recent inguinal hernia repair: Dec 2024 at [facility]. No current issues. ?etiology of dot of free air on CT.               Family Communication:  updated brother and cousin 07/01/23   Consultants:  palliative   Code Status:  FULL COMFORT   DVT Prophylaxis:  FULL COMFORT     Procedures: As Listed in Progress Note Above   Antibiotics: None            Subjective: Pt is somnolent.  No vomiting.  No resp distress.    Objective: Vitals:   06/29/23 2020 06/30/23 0918 06/30/23 2037 07/01/23 1459  BP: 116/70 124/80 112/81 111/86  Pulse: 94 97 94 86  Resp: (!) 26 (!) 24 (!) 22 17  Temp: 97.7 F (36.5 C)  98.1 F (36.7 C) 97.6 F (36.4 C)  TempSrc: Axillary  Axillary Axillary  SpO2: 90%  92% 92%  Weight:      Height:        Intake/Output Summary (Last 24 hours) at 07/01/2023 1801 Last data filed at 07/01/2023 1330 Gross per 24 hour  Intake 0 ml  Output 850 ml  Net -850 ml   Weight change:  Exam:  General:  Pt is somnolent,  not in acute distress HEENT: No neck mass, [place]/AT Cardiovascular: RRR, S1/S2, no rubs, no gallops Respiratory: scattered rales Abdomen: Soft/+BS, non tender, non distended, no guarding Extremities: N2 + LEedema, No lymphangitis, No petechiae, No rashes, no synovitis   Data Reviewed: I have personally reviewed following labs and imaging studies Basic Metabolic Panel: Recent Labs  Lab 06/25/23 0507 06/25/23 0842 06/25/23 1234 06/25/23 1623 06/25/23 2100 06/26/23 0428 06/26/23 0904  NA 113*   < > 116* 117* 120* 121*  121* 123*  K 4.5  --   --   --   --  5.0  --   CL 87*  --   --   --   --  92*  --   CO2 19*  --   --   --   --  20*  --   GLUCOSE 132*  --   --   --   --  93  --   BUN 33*  --   --   --   --  39*  --   CREATININE 0.78  --   --   --   --  0.90  --   CALCIUM 8.5*  --   --   --   --  8.4*  --    < > = values in this interval not displayed.   Liver Function Tests: No results for input(s): "AST", "ALT", "ALKPHOS", "BILITOT", "PROT", "ALBUMIN" in the last 168 hours. No results for input(s): "LIPASE", "AMYLASE" in the last 168  hours. No results for input(s): "AMMONIA" in the last 168 hours. Coagulation Profile: Recent Labs  Lab 06/25/23 1144  INR 1.5*   CBC: Recent Labs  Lab 06/25/23 0507 06/26/23 0428  WBC 7.3 8.4  HGB 11.9* 10.5*  HCT 33.3* 29.6*  MCV 100.3* 101.7*  PLT 62* 42*   Cardiac Enzymes: No results for input(s): "CKTOTAL", "CKMB", "CKMBINDEX", "TROPONINI" in the last 168 hours. BNP: Invalid input(s): "POCBNP" CBG: No results for input(s): "GLUCAP" in the last 168 hours. HbA1C: No results for input(s): "HGBA1C" in the last 72 hours. Urine analysis:    Component Value Date/Time   COLORURINE AMBER (A) 06/25/2023 1549   APPEARANCEUR HAZY (A) 06/25/2023 1549   LABSPEC 1.024 06/25/2023 1549   PHURINE 5.0 06/25/2023 1549   GLUCOSEU NEGATIVE 06/25/2023 1549   HGBUR NEGATIVE 06/25/2023 1549   BILIRUBINUR NEGATIVE 06/25/2023 1549   KETONESUR NEGATIVE 06/25/2023 1549   PROTEINUR NEGATIVE 06/25/2023 1549   NITRITE NEGATIVE 06/25/2023 1549   LEUKOCYTESUR NEGATIVE 06/25/2023 1549   Sepsis Labs: @LABRCNTIP (procalcitonin:4,lacticidven:4) ) Recent Results (from the past 240 hours)  Gram stain     Status: None   Collection Time: 06/22/23  4:15 PM   Specimen: Path fluid  Result Value Ref Range Status   Specimen Description FLUID  Final   Special Requests PERIUMBILICAL DRAINAGE  Final   Gram Stain   Final    CYTOSPIN SMEAR NO ORGANISMS SEEN WBC PRESENT,BOTH PMN AND MONONUCLEAR Performed at [facility], [address], [place], [place] [address]    Report Status 06/22/2023 FINAL  Final  Culture, body fluid w Gram Stain-bottle     Status: None   Collection Time: 06/22/23  4:15 PM   Specimen: Path fluid  Result Value Ref Range Status   Specimen Description FLUID 10CC  Final   Special Requests PERIUMBILICAL DRAINAGE  Final   Culture   Final    NO GROWTH 5 DAYS Performed at [facility], [address] [place], [place] [address]    Report Status 06/27/2023 FINAL  Final  MRSA Next Gen  by PCR, Nasal     Status: None   Collection Time: 06/25/23  8:00 AM   Specimen: Nasal Mucosa; Nasal Swab  Result Value Ref Range Status   MRSA by PCR Next Gen NOT DETECTED NOT DETECTED Final    Comment: (NOTE) The GeneXpert MRSA Assay (FDA approved for NASAL specimens only), is one component of a comprehensive MRSA colonization surveillance program. It is not intended to diagnose MRSA infection nor to guide or monitor treatment for MRSA infections. Test performance is not FDA approved in patients less than 72 years old. Performed at [facility], [address]  [place], [place] [address]      Scheduled Meds:  fentaNYL (SUBLIMAZE) injection  50 mcg Intravenous Q4H   Continuous Infusions:  Procedures/Studies: [place] EKG SITE RITE Result Date: 06/25/2023 If Site Rite image not attached, placement could not be confirmed due to current cardiac rhythm.  DG Chest 2 View Result Date: 06/24/2023 CLINICAL DATA:  Shortness of breath, chest pain. EXAM: CHEST - 2 VIEW COMPARISON:  Chest radiograph dated 06/19/2023. FINDINGS: The heart size and mediastinal contours are within normal limits. Moderate bilateral perihilar interstitial and airspace opacities. No pleural effusion or pneumothorax. The visualized skeletal structures are unremarkable. IMPRESSION: Moderate bilateral perihilar interstitial and airspace opacities may represent pulmonary edema or atypical infection. Electronically Signed   By: Romona Curls M.D.   On: 06/24/2023 12:58   [place] IMAGE GUIDED DRAINAGE BY PERCUTANEOUS CATHETER Result Date: 06/22/2023 INDICATION: Periumbilical collection after hernia repair EXAM: ULTRASOUND-GUIDED ASPIRATION SUBCUTANEOUS COLLECTION MEDICATIONS: No periprocedural antibiotics were indicated ANESTHESIA/SEDATION: Lidocaine 1% subcutaneous COMPLICATIONS: None immediate. PROCEDURE: Informed written consent was obtained from the patient after a thorough discussion of the procedural risks, benefits and alternatives.  All questions were addressed. Maximal Sterile Barrier Technique was utilized including caps, mask, sterile gowns, sterile gloves, sterile drape, hand hygiene and skin antiseptic. A timeout was performed prior to the initiation of the procedure. Survey ultrasound of the region was performed and an appropriate skin entry site was determined and marked. Region prepped with chlorhexidine, draped in usual sterile fashion, infiltrated locally with 1% lidocaine. 21 gauge princess needle advanced into the collection. 90 mL of thin straw-colored fluid were aspirated, sent for Gram stain and culture. Moderate residual for fluid was identified but aggressive measures for complete evacuation were avoided to minimize any risk of continuing leak at the needle entry site. Needle was removed and a sterile dressing applied. No immediate complication. The patient tolerated the procedure well. IMPRESSION: 1. Technically successful ultrasound-guided aspiration , periumbilical subcutaneous fluid. Sample sent for Gram stain and culture. Electronically Signed   By: Corlis Leak M.D.   On: 06/22/2023 17:06   US Abdomen Limited Result Date: 06/22/2023 CLINICAL DATA:  Umbilical hernia. EXAM: ULTRASOUND ABDOMEN LIMITED COMPARISON:  CT abdomen pelvis 06/19/2023 FINDINGS: The site of the umbilicus there is a 6.7 x 3.0 x 6.6 cm complex fluid collection. No definite herniated fat identified. IMPRESSION: Complex fluid collection at the site of the umbilicus. No definite herniated fat identified. Electronically Signed   By: Annia Belt M.D.   On: 06/22/2023 14:15   [place] ASCITES (ABDOMEN LIMITED) Result Date: 06/20/2023 CLINICAL DATA:  Patient with history of cirrhosis, portal HTN s/p TIPS creation 09/04/22 who is currently admitted with altered mental status and multiple rib fractures. Request for possible paracentesis. EXAM: LIMITED ABDOMEN ULTRASOUND FOR ASCITES TECHNIQUE: Limited ultrasound survey for ascites was performed in all four abdominal  quadrants. COMPARISON:  CT abd/pelvis w/contrast 06/18/22. FINDINGS: Small amount of ascites in the right upper quadrant only that is not amenable to paracentesis. IMPRESSION: Small amount of ascites.  Paracentesis was not performed. Electronically Signed   By: Marliss Coots M.D.   On: 06/20/2023 16:01   CT ABDOMEN PELVIS W CONTRAST Result Date: 06/19/2023 CLINICAL DATA:  Abdominal pain, suicidal ideation EXAM: CT ABDOMEN AND PELVIS WITH CONTRAST TECHNIQUE: Multidetector CT imaging of the abdomen and pelvis was performed using the standard protocol following bolus administration of intravenous contrast. RADIATION DOSE REDUCTION: This exam was performed according to the departmental dose-optimization program which includes automated exposure control, adjustment of the mA  and/or kV according to patient size and/or use of iterative reconstruction technique. CONTRAST:  OMNIPAQUE IOHEXOL 300 MG/ML  SOLN COMPARISON:  MRI abdomen dated 01/25/2023. CTA abdomen/pelvis dated 11/10/2022. FINDINGS: Motion degraded images. Lower chest: Mild patchy lingular and left lower lobe opacities, favoring atelectasis. Hepatobiliary: Cirrhosis. TIPS. Embolization coils along the falciform ligament (of a recanalized periumbilical vein) with streak artifact along the liver. Within that constraint, there are no findings suspicious for [facility]. Layering tiny gallstones (series 3/image 30), without associated inflammatory changes. No intrahepatic or extrahepatic duct dilatation. Pancreas: Within normal limits. Spleen: Within the upper limits of normal for size. Adrenals/Urinary Tract: 4.5 cm left adrenal nodule, containing macroscopic fat, compatible with a benign adrenal myelolipoma. Right adrenal glands are within normal limits. Right kidney is within normal limits. Two nonobstructing left lower pole renal calculi measuring up to 2 mm (series 3/image 32). No hydronephrosis. Bladder is within normal limits. Stomach/Bowel: Stomach is within  normal limits. No evidence of bowel obstruction. Mild wall thickening involving the descending duodenum (series 3/image 38), correlate for duodenitis. Suspected appendix is within normal limits (series 3/image 55). No colonic wall thickening or inflammatory changes. Vascular/Lymphatic: No evidence of abdominal aortic aneurysm. Atherosclerotic calcifications of the abdominal aorta and branch vessels, although vessels remain patent. No suspicious abdominopelvic lymphadenopathy. Reproductive: Prostate is grossly unremarkable. Other: Trace pelvic ascites. Suspected tiny foci of free air overlying the right liver (series 3/images 15 and 20), correlate for recent prior intervention/paracentesis. Musculoskeletal: Multiple subacute/healing bilateral rib fracture deformities. Healed left lateral transverse process deformities at L1-2. No acute fracture is seen. IMPRESSION: Motion degraded images. Multiple subacute/healing bilateral rib fracture deformities. Healed left lateral transverse process deformities at L1-2. No acute fracture is seen. Suspected tiny foci of free air overlying the right liver, correlate for recent prior intervention/paracentesis. Mild wall thickening involving the descending duodenum, correlate for duodenitis. Cirrhosis. TIPS. No findings suspicious for HCC. Additional ancillary findings as above. Electronically Signed   By: Charline Bills M.D.   On: 06/19/2023 22:10   CT Head Wo Contrast Result Date: 06/19/2023 CLINICAL DATA:  Mental status change, unknown cause EXAM: CT HEAD WITHOUT CONTRAST TECHNIQUE: Contiguous axial images were obtained from the base of the skull through the vertex without intravenous contrast. RADIATION DOSE REDUCTION: This exam was performed according to the departmental dose-optimization program which includes automated exposure control, adjustment of the mA and/or kV according to patient size and/or use of iterative reconstruction technique. COMPARISON:  Head CT  04/10/2023 FINDINGS: Brain: No hemorrhage. No hydrocephalus. No extra-axial fluid collection. No mass effect. No mass lesion. No CT evidence of acute cortical infarct. Vascular: No hyperdense vessel or unexpected calcification. Skull: Normal. Negative for fracture or focal lesion. Sinuses/Orbits: No middle ear or mastoid effusion. Paranasal sinuses are clear. Orbits are unremarkable. Other: None. IMPRESSION: No acute intracranial abnormality. Electronically Signed   By: Lorenza Cambridge M.D.   On: 06/19/2023 21:48   DG Chest Portable 1 View Result Date: 06/19/2023 CLINICAL DATA:  Weakness EXAM: PORTABLE CHEST 1 VIEW COMPARISON:  10/05/2022 FINDINGS: Low lung volumes. Borderline to mild cardiomegaly. Streaky atelectasis or scarring at the left base. No pleural effusion or pneumothorax IMPRESSION: Low lung volumes with streaky atelectasis or scarring at the left base. Electronically Signed   By: Jasmine Pang M.D.   On: 06/19/2023 20:42    Catarina Hartshorn, DO  [facility]  If 7PM-7AM, please contact night-coverage [email or web address] Password [email or web address] 07/01/2023, 6:01 PM   LOS: 12 days

## 2023-07-02 DIAGNOSIS — K7460 Unspecified cirrhosis of liver: Secondary | ICD-10-CM

## 2023-07-02 DIAGNOSIS — R188 Other ascites: Secondary | ICD-10-CM | POA: Diagnosis not present

## 2023-07-02 DIAGNOSIS — D696 Thrombocytopenia, unspecified: Secondary | ICD-10-CM | POA: Diagnosis not present

## 2023-07-02 DIAGNOSIS — Z515 Encounter for palliative care: Secondary | ICD-10-CM | POA: Diagnosis not present

## 2023-07-02 DIAGNOSIS — G9341 Metabolic encephalopathy: Secondary | ICD-10-CM | POA: Diagnosis not present

## 2023-07-02 NOTE — Discharge Summary (Signed)
Physician Discharge Summary   Patient: Alejandro Porter MRN: [id number] DOB: [birth year]  Admit date:     06/19/2023  Discharge date: 07/02/23  Discharge Physician: Onalee Hua Ibeth Fahmy   PCP: Reather Converse, PA-C   DISCHARGE TO RESIDENTIAL HOSPICE FOR END OF LIFE CARE   Hospital Course: Ibn Stief is a 48 y.o. male with a history of NASH cirrhosis s/p TIPS who presented to the ED on 06/19/2023 with suicidal thoughts at home. History limited due to encephalopathy. He was tachycardic, afebrile, WBC 14.7k. He was treated for infection, cellulitis, then UTI, increased lactulose and added rifaximin with improvement in mentation. He has, per his brother, a history of cognitive impairment to some degree, and had improved near his baseline. He has not had any evidence of ongoing suicidal ideation, psychiatry cleared him. Hospitalization complicated by urinary retention for which foley was placed, and later severe hyponatremia in the setting of progressive anasarca from decompensated cirrhosis. He was transferred to the ICU, PICC placed, hypertonic saline initiated with steady improvement in sodium level but overall continued clinical decline. Having bleeding due to coagulopathy treated with vitamin K. Palliative care has continued conversations with the patient and family.  Ultimately, patient was transitioned to comfort care measure after palliative medicine conversations.  His comfort measure medications were monitored and adjusted to optimized symptom management. Family was continually updated.  Ultimately, they agreed to transition patient to residential hospice.  Assessment and Plan: Goals of care: Pt is confirmed DNR and would not desire escalation of care further than our current situation. Palliative and medical teams have been in communication with family. --prognosis is limited to hours-days regardless of maximal medical therapy at this time. Given poor level of alertness and suspected  inability to manage secretions, minimize oral medications.  -pt has continued to decline despite aggressive medical therapy -palliative medicine conversation with family>>transition to full comfort measures -medications for comfort measure continue to be optimized to minimize patient discomfort and suffering   Hyponatremia:  -Has this chronically related to cirrhosis but worsened after SSRI introduced, since stopped.  -Worsened with reintroduction of lasix as well.  - Improved on hypertonic saline, though underlying issue of intravascular depletion and extravascular overload due to decompensated cirrhosis remains.  -appreciate nephrology -no further labs done as patient has transitioned to full comfort measures   Multifactorial metabolic encephalopathy:  -Hyponatremia, Hyperammonemia likely contributing, though infection not completely ruled out. Note initial concern for RLE cellulitis which is being treated with abx. CT head showed no acute intracranial abnormality. Negative UDS. - Level of orientation initially improved Though he remained disoriented to year and with insight limitation which precludes his capacity to make the decision to leave the hospital against medical advice at this time.  -unfortunate;y, mental status declined again and continued to worsen again despite aggressive medical measures - Limit sedating medications, though his delirium is now limiting patient care, pulling IV's - initially high dose lactulose. Added rifaximin given persistent AMS and ammonia elevation.  - Insufficient fluid for diagnostic paracentesis 1/15, and was covered with antibiotics empirically to cover SBP in addition to his cellulitis as below.    Umbilical hernia, dating back to at least 2022 with complex fluid collection: ?seroma - D/w surgery, Dr. Lovell Sheehan, now s/p 90cc clear yellow fluid drained 1/17 by Dr. Deanne Coffer. Negative gram stain, cell counts not appearing overtly infected, negative culture.  Suspect seroma.  -no leukocytosis, no fever   Suicidal ideation, depression: This appears to have been situational and in the setting  of encephalopathy. This has durably resolved. He has no thoughts of self harm at this time and no plans.  - Psychiatry consulted, started SSRI (since stopped due to hyponatremia), and prn hydroxyzine. No suicide precautions required, no psychiatric inpatient admission recommended.     Decompensated NASH cirrhosis:  -MELD score is 28 (19.6% 43-month mortality) - Appeared intravascularly depleted, so diuretics held.  -Restarted due to volume overload but UNa low and intravascularly depleted so stopped lasix again.  - Coagulopathy noted. Tx w/vitamin K. - Given his advanced cirrhosis and failure to thrive with malnutrition, palliative care is consulted for additional support.    Acute urinary retention: Prostate grossly unremarkable on CT which also showed normal right kidney, nonobstructing 2mm stones in left kidney without hydronephrosis. Bladder wnl. Urinalysis with some RBCs, protein and WBCs, hyaline casts. Urine culture negative.  - Continue tamsulosin, foley   Iron deficiency, macrocytic anemia, thrombocytopenia: B12 and folic acid are replete. Suspect this is due to cirrhosis.  - Having lip bleeding, so have to stop lovenox VTE ppx. Use SCDs  - now comfort measures   GERD: Thickening of duodenum suggested by CT. Will keep this in mind without directed treatment at this time.  - Continue PPI--d/c as pt is transitioned to comfort measures   Falls at home, deconditioning: Appears to be failing to thrive. CT revealed multiple bilateral rib fractures in stages of healing.  - PT/OT/RD evaluations appreciated.    AKI: Improved with supportive measures, holding diuretics. CrCl > 62ml/min. -now comfort measures   Prolonged QT interval: at admission.  - Minimize provocative agents   RLE cellulitis:  - Nonpurulent, converted to cefadroxil, clinically  much improved, Leukocytosis has resolved. Will stop abx.    Severe protein calorie malnutrition:  - Supplement protein as much as possible.    Recent inguinal hernia repair: Dec 2024 at [facility]. No current issues. ?etiology of dot of free air on CT.          Consultants: palliative Procedures performed: none  Disposition: Hospice care Diet recommendation:  Regular diet DISCHARGE MEDICATION: Allergies as of 07/02/2023       Reactions   Chlorthalidone    Severe headaches    Metoprolol    Severe headaches         Medication List     STOP taking these medications    cyclobenzaprine 10 MG tablet Commonly known as: FLEXERIL   dicyclomine 10 MG capsule Commonly known as: BENTYL   DULoxetine 60 MG capsule Commonly known as: CYMBALTA   FeroSul 325 (65 Fe) MG tablet Generic drug: ferrous sulfate   furosemide 40 MG tablet Commonly known as: LASIX   gabapentin 300 MG capsule Commonly known as: NEURONTIN   HYDROcodone-acetaminophen 5-325 MG tablet Commonly known as: NORCO/VICODIN   lactulose 10 GM/15ML solution Commonly known as: CHRONULAC   levOCARNitine 330 MG tablet Commonly known as: CARNITOR   ondansetron 4 MG tablet Commonly known as: ZOFRAN   pantoprazole 40 MG tablet Commonly known as: PROTONIX   spironolactone 100 MG tablet Commonly known as: ALDACTONE   Vitamin D3 125 MCG (5000 UT) Tabs   Xifaxan 550 MG Tabs tablet Generic drug: rifaximin   Zinc 50 MG Tabs        Follow-up Information     [facility] Follow up.   Specialty: Behavioral Health Contact information: [address] [place] [address] [phone]               Discharge  Exam: Filed Weights   06/19/23 1821 06/20/23 0000  Weight: 97 kg 86.6 kg   HEENT:  [place]/AT, CV:  RRR, Lung:  bilateral rales Abd:  soft/+BS, NT Ext:  2+LE edema,   Condition at discharge: stable  The results of significant diagnostics from this  hospitalization (including imaging, microbiology, ancillary and laboratory) are listed below for reference.   Imaging Studies: [place] EKG SITE RITE Result Date: 06/25/2023 If Site Rite image not attached, placement could not be confirmed due to current cardiac rhythm.  DG Chest 2 View Result Date: 06/24/2023 CLINICAL DATA:  Shortness of breath, chest pain. EXAM: CHEST - 2 VIEW COMPARISON:  Chest radiograph dated 06/19/2023. FINDINGS: The heart size and mediastinal contours are within normal limits. Moderate bilateral perihilar interstitial and airspace opacities. No pleural effusion or pneumothorax. The visualized skeletal structures are unremarkable. IMPRESSION: Moderate bilateral perihilar interstitial and airspace opacities may represent pulmonary edema or atypical infection. Electronically Signed   By: Romona Curls M.D.   On: 06/24/2023 12:58   [place] IMAGE GUIDED DRAINAGE BY PERCUTANEOUS CATHETER Result Date: 06/22/2023 INDICATION: Periumbilical collection after hernia repair EXAM: ULTRASOUND-GUIDED ASPIRATION SUBCUTANEOUS COLLECTION MEDICATIONS: No periprocedural antibiotics were indicated ANESTHESIA/SEDATION: Lidocaine 1% subcutaneous COMPLICATIONS: None immediate. PROCEDURE: Informed written consent was obtained from the patient after a thorough discussion of the procedural risks, benefits and alternatives. All questions were addressed. Maximal Sterile Barrier Technique was utilized including caps, mask, sterile gowns, sterile gloves, sterile drape, hand hygiene and skin antiseptic. A timeout was performed prior to the initiation of the procedure. Survey ultrasound of the region was performed and an appropriate skin entry site was determined and marked. Region prepped with chlorhexidine, draped in usual sterile fashion, infiltrated locally with 1% lidocaine. 21 gauge princess needle advanced into the collection. 90 mL of thin straw-colored fluid were aspirated, sent for Gram stain and culture. Moderate  residual for fluid was identified but aggressive measures for complete evacuation were avoided to minimize any risk of continuing leak at the needle entry site. Needle was removed and a sterile dressing applied. No immediate complication. The patient tolerated the procedure well. IMPRESSION: 1. Technically successful ultrasound-guided aspiration , periumbilical subcutaneous fluid. Sample sent for Gram stain and culture. Electronically Signed   By: Corlis Leak M.D.   On: 06/22/2023 17:06   US Abdomen Limited Result Date: 06/22/2023 CLINICAL DATA:  Umbilical hernia. EXAM: ULTRASOUND ABDOMEN LIMITED COMPARISON:  CT abdomen pelvis 06/19/2023 FINDINGS: The site of the umbilicus there is a 6.7 x 3.0 x 6.6 cm complex fluid collection. No definite herniated fat identified. IMPRESSION: Complex fluid collection at the site of the umbilicus. No definite herniated fat identified. Electronically Signed   By: Annia Belt M.D.   On: 06/22/2023 14:15   [place] ASCITES (ABDOMEN LIMITED) Result Date: 06/20/2023 CLINICAL DATA:  Patient with history of cirrhosis, portal HTN s/p TIPS creation 09/04/22 who is currently admitted with altered mental status and multiple rib fractures. Request for possible paracentesis. EXAM: LIMITED ABDOMEN ULTRASOUND FOR ASCITES TECHNIQUE: Limited ultrasound survey for ascites was performed in all four abdominal quadrants. COMPARISON:  CT abd/pelvis w/contrast 06/18/22. FINDINGS: Small amount of ascites in the right upper quadrant only that is not amenable to paracentesis. IMPRESSION: Small amount of ascites.  Paracentesis was not performed. Electronically Signed   By: Marliss Coots M.D.   On: 06/20/2023 16:01   CT ABDOMEN PELVIS W CONTRAST Result Date: 06/19/2023 CLINICAL DATA:  Abdominal pain, suicidal ideation EXAM: CT ABDOMEN AND PELVIS WITH CONTRAST TECHNIQUE: Multidetector CT  imaging of the abdomen and pelvis was performed using the standard protocol following bolus administration of intravenous  contrast. RADIATION DOSE REDUCTION: This exam was performed according to the departmental dose-optimization program which includes automated exposure control, adjustment of the mA and/or kV according to patient size and/or use of iterative reconstruction technique. CONTRAST:  OMNIPAQUE IOHEXOL 300 MG/ML  SOLN COMPARISON:  MRI abdomen dated 01/25/2023. CTA abdomen/pelvis dated 11/10/2022. FINDINGS: Motion degraded images. Lower chest: Mild patchy lingular and left lower lobe opacities, favoring atelectasis. Hepatobiliary: Cirrhosis. TIPS. Embolization coils along the falciform ligament (of a recanalized periumbilical vein) with streak artifact along the liver. Within that constraint, there are no findings suspicious for [facility]. Layering tiny gallstones (series 3/image 30), without associated inflammatory changes. No intrahepatic or extrahepatic duct dilatation. Pancreas: Within normal limits. Spleen: Within the upper limits of normal for size. Adrenals/Urinary Tract: 4.5 cm left adrenal nodule, containing macroscopic fat, compatible with a benign adrenal myelolipoma. Right adrenal glands are within normal limits. Right kidney is within normal limits. Two nonobstructing left lower pole renal calculi measuring up to 2 mm (series 3/image 32). No hydronephrosis. Bladder is within normal limits. Stomach/Bowel: Stomach is within normal limits. No evidence of bowel obstruction. Mild wall thickening involving the descending duodenum (series 3/image 38), correlate for duodenitis. Suspected appendix is within normal limits (series 3/image 55). No colonic wall thickening or inflammatory changes. Vascular/Lymphatic: No evidence of abdominal aortic aneurysm. Atherosclerotic calcifications of the abdominal aorta and branch vessels, although vessels remain patent. No suspicious abdominopelvic lymphadenopathy. Reproductive: Prostate is grossly unremarkable. Other: Trace pelvic ascites. Suspected tiny foci of free air overlying  the right liver (series 3/images 15 and 20), correlate for recent prior intervention/paracentesis. Musculoskeletal: Multiple subacute/healing bilateral rib fracture deformities. Healed left lateral transverse process deformities at L1-2. No acute fracture is seen. IMPRESSION: Motion degraded images. Multiple subacute/healing bilateral rib fracture deformities. Healed left lateral transverse process deformities at L1-2. No acute fracture is seen. Suspected tiny foci of free air overlying the right liver, correlate for recent prior intervention/paracentesis. Mild wall thickening involving the descending duodenum, correlate for duodenitis. Cirrhosis. TIPS. No findings suspicious for HCC. Additional ancillary findings as above. Electronically Signed   By: Charline Bills M.D.   On: 06/19/2023 22:10   CT Head Wo Contrast Result Date: 06/19/2023 CLINICAL DATA:  Mental status change, unknown cause EXAM: CT HEAD WITHOUT CONTRAST TECHNIQUE: Contiguous axial images were obtained from the base of the skull through the vertex without intravenous contrast. RADIATION DOSE REDUCTION: This exam was performed according to the departmental dose-optimization program which includes automated exposure control, adjustment of the mA and/or kV according to patient size and/or use of iterative reconstruction technique. COMPARISON:  Head CT 04/10/2023 FINDINGS: Brain: No hemorrhage. No hydrocephalus. No extra-axial fluid collection. No mass effect. No mass lesion. No CT evidence of acute cortical infarct. Vascular: No hyperdense vessel or unexpected calcification. Skull: Normal. Negative for fracture or focal lesion. Sinuses/Orbits: No middle ear or mastoid effusion. Paranasal sinuses are clear. Orbits are unremarkable. Other: None. IMPRESSION: No acute intracranial abnormality. Electronically Signed   By: Lorenza Cambridge M.D.   On: 06/19/2023 21:48   DG Chest Portable 1 View Result Date: 06/19/2023 CLINICAL DATA:  Weakness EXAM:  PORTABLE CHEST 1 VIEW COMPARISON:  10/05/2022 FINDINGS: Low lung volumes. Borderline to mild cardiomegaly. Streaky atelectasis or scarring at the left base. No pleural effusion or pneumothorax IMPRESSION: Low lung volumes with streaky atelectasis or scarring at the left base. Electronically Signed   By:  Jasmine Pang M.D.   On: 06/19/2023 20:42    Microbiology: Results for orders placed or performed during the hospital encounter of 06/19/23  Blood culture (routine x 2)     Status: None   Collection Time: 06/19/23 10:33 PM   Specimen: BLOOD  Result Value Ref Range Status   Specimen Description BLOOD BLOOD LEFT ARM  Final   Special Requests   Final    BOTTLES DRAWN AEROBIC AND ANAEROBIC Blood Culture adequate volume   Culture   Final    NO GROWTH 5 DAYS Performed at [facility], [address] [place], [place] [address]    Report Status 06/24/2023 FINAL  Final  Blood culture (routine x 2)     Status: None   Collection Time: 06/19/23 10:36 PM   Specimen: BLOOD  Result Value Ref Range Status   Specimen Description BLOOD BLOOD LEFT HAND  Final   Special Requests   Final    BOTTLES DRAWN AEROBIC AND ANAEROBIC Blood Culture adequate volume   Culture   Final    NO GROWTH 5 DAYS Performed at [facility], [address] [place], [place] [address]    Report Status 06/24/2023 FINAL  Final  Urine Culture     Status: None   Collection Time: 06/21/23  5:20 PM   Specimen: Urine, Random  Result Value Ref Range Status   Specimen Description   Final    URINE, RANDOM Performed at [facility], [address], [place], [place] [address]    Special Requests   Final    NONE Reflexed from [id number] Performed at [facility], [address] [place], [place] [address]    Culture   Final    NO GROWTH Performed at [facility], [address], [place], [place] [address]    Report Status 06/22/2023 FINAL  Final  Gram stain     Status: None   Collection Time: 06/22/23  4:15 PM   Specimen: Path  fluid  Result Value Ref Range Status   Specimen Description FLUID  Final   Special Requests PERIUMBILICAL DRAINAGE  Final   Gram Stain   Final    CYTOSPIN SMEAR NO ORGANISMS SEEN WBC PRESENT,BOTH PMN AND MONONUCLEAR Performed at [place] [facility], [address], [place], [place] [address]    Report Status 06/22/2023 FINAL  Final  Culture, body fluid w Gram Stain-bottle     Status: None   Collection Time: 06/22/23  4:15 PM   Specimen: Path fluid  Result Value Ref Range Status   Specimen Description FLUID 10CC  Final   Special Requests PERIUMBILICAL DRAINAGE  Final   Culture   Final    NO GROWTH 5 DAYS Performed at [facility], [address] [place], [place] [address]    Report Status 06/27/2023 FINAL  Final  MRSA Next Gen by PCR, Nasal     Status: None   Collection Time: 06/25/23  8:00 AM   Specimen: Nasal Mucosa; Nasal Swab  Result Value Ref Range Status   MRSA by PCR Next Gen NOT DETECTED NOT DETECTED Final    Comment: (NOTE) The GeneXpert MRSA Assay (FDA approved for NASAL specimens only), is one component of a comprehensive MRSA colonization surveillance program. It is not intended to diagnose MRSA infection nor to guide or monitor treatment for MRSA infections. Test performance is not FDA approved in patients less than 66 years old. Performed at [facility], [address] [place], [place] [address]     Labs:  CBC: Recent Labs  Lab 06/26/23 0428  WBC 8.4  HGB 10.5*  HCT 29.6*  MCV 101.7*  PLT 42*   Basic Metabolic Panel: Recent Labs  Lab 06/25/23 1623 06/25/23 2100 06/26/23 0428 06/26/23 0904  NA 117* 120* 121*  121* 123*  K  --   --  5.0  --   CL  --   --  92*  --   CO2  --   --  20*  --   GLUCOSE  --   --  93  --   BUN  --   --  39*  --   CREATININE  --   --  0.90  --   CALCIUM  --   --  8.4*  --    Liver Function Tests: No results for input(s): "AST", "ALT", "ALKPHOS", "BILITOT", "PROT", "ALBUMIN" in the last 168 hours. CBG: No results for  input(s): "GLUCAP" in the last 168 hours.  Discharge time spent: greater than 30 minutes.  Signed: Catarina Hartshorn, MD [facility] 07/02/2023

## 2023-07-02 NOTE — TOC Progression Note (Addendum)
Transition of Care [facility]) - Progression Note    Patient Details  Name: Alejandro [place] MRN: [id number] Date of Birth: 12/09/1975  Transition of [facility] CM/SW Contact  V[place] Herb, [place] Phone Number: 07/02/2023, 9:20 AM  Clinical Narrative:    CSW updated by MD that family is requesting residential hospice referral be made to [facility]. CSW spoke to [place] with [facility] who states that hospice RN will come to hospital between 11-11:30 to assess pt for residential placement.   CSW updated that prior [facility] has been approved and hospice RN completed assessment. CSW updated that pt can discharge to [facility] today, they are awaiting pts brother to sign the consents. CSW updated MD that D/C can be completed. TOC to follow.   Expected Discharge Plan: Skilled Nursing Facility Barriers to Discharge: Continued Medical Work up  Expected Discharge Plan and Services In-house Referral: Clinical Social Work Discharge Planning Services: CM Consult   Living arrangements for the past 2 months: Single Family Home                                       Social Determinants of Health (SDOH) Interventions SDOH Screenings   Food Insecurity: No Food Insecurity (06/20/2023)  Housing: Low Risk  (06/20/2023)  Transportation Needs: Unmet Transportation Needs (06/20/2023)  Utilities: Not At Risk (06/20/2023)  Depression (PHQ2-9): High Risk (06/28/2022)  Tobacco Use: Medium Risk (06/25/2023)    Readmission Risk Interventions    06/20/2023    1:20 PM  Readmission Risk Prevention Plan  Transportation Screening Complete  Home Care Screening Complete  Medication Review (RN CM) Complete

## 2023-07-02 NOTE — Plan of Care (Signed)
  Problem: Clinical Measurements: Goal: Diagnostic test results will improve Outcome: Not Progressing Goal: Respiratory complications will improve Outcome: Not Progressing   Problem: Activity: Goal: Risk for activity intolerance will decrease Outcome: Not Progressing   Problem: Nutrition: Goal: Adequate nutrition will be maintained Outcome: Not Progressing   Problem: Coping: Goal: Level of anxiety will decrease Outcome: Not Progressing   Problem: Elimination: Goal: Will not experience complications related to bowel motility Outcome: Not Progressing   Problem: Pain Management: Goal: General experience of comfort will improve Outcome: Not Progressing

## 2023-07-02 NOTE — Progress Notes (Signed)
Palliative:   Alejandro [place] is lying quietly in bed.  He appears to be actively dying.  He does not interact active with me and clearly cannot make his needs known.  There is no family at bedside at this time.  Face-to-face discussion with bedside nursing staff related to patient condition, needs.  Conference with attending, bedside nursing staff, transition of care team related to patient condition, needs, goals of care, disposition.  Plan: Comfort and dignity at end-of-life, comfort care implemented 1/21.  End-of-life order set in place.  Comfort meds have been scheduled. Prognosis: Anticipate death within the next 24 hours or less.  25 minutes  Lillia Carmel, NP Palliative Medicine Team  Team Phone [phone]

## 2023-07-02 NOTE — TOC Transition Note (Signed)
Transition of Care [facility]) - Discharge Note   Patient Details  Name: Alejandro Porter MRN: [id number] Date of Birth: Nov 06, 1975  [facility] CM/SW Contact:  Villa Herb, LCSWA Phone Number: 07/02/2023, 1:35 PM   Clinical Narrative:    CSW updated by Myna Hidalgo that [facility] is ready to accept pt at this time. CSW updated RN and MD of this. Gertie Exon has placed pt on the EMS list. Pts brother has signed all needed consents for residential hospice placement. TOC signing off.   Final next level of care: Hospice Medical Facility Barriers to Discharge: Barriers Resolved   Patient Goals and CMS Choice Patient states their goals for this hospitalization and ongoing recovery are:: go to hospice residential facility CMS Medicare.gov Compare Post Acute Care list provided to:: Patient Represenative (must comment) Choice offered to / list presented to : Patient, Sibling [place] ownership interest in [facility].provided to:: Sibling    Discharge Placement                  Name of family member notified: Brother Patient and family notified of of transfer: 07/02/23  Discharge Plan and Services Additional resources added to the After Visit Summary for   In-house Referral: Clinical Social Work Discharge Planning Services: CM Consult                                 Social Drivers of Health (SDOH) Interventions SDOH Screenings   Food Insecurity: No Food Insecurity (06/20/2023)  Housing: Low Risk  (06/20/2023)  Transportation Needs: Unmet Transportation Needs (06/20/2023)  Utilities: Not At Risk (06/20/2023)  Depression (PHQ2-9): High Risk (06/28/2022)  Tobacco Use: Medium Risk (06/25/2023)     Readmission Risk Interventions    06/20/2023    1:20 PM  Readmission Risk Prevention Plan  Transportation Screening Complete  Home Care Screening Complete  Medication Review (RN CM) Complete

## 2023-07-07 DEATH — deceased

## 2023-08-21 IMAGING — DX DG SHOULDER 2+V*R*
3 series · 3 of 3 positions shown · non-contrast
Comparison: None.

CLINICAL DATA: Right shoulder pain.

EXAM:
RIGHT SHOULDER - 2+ VIEW

[shoulder grashey]
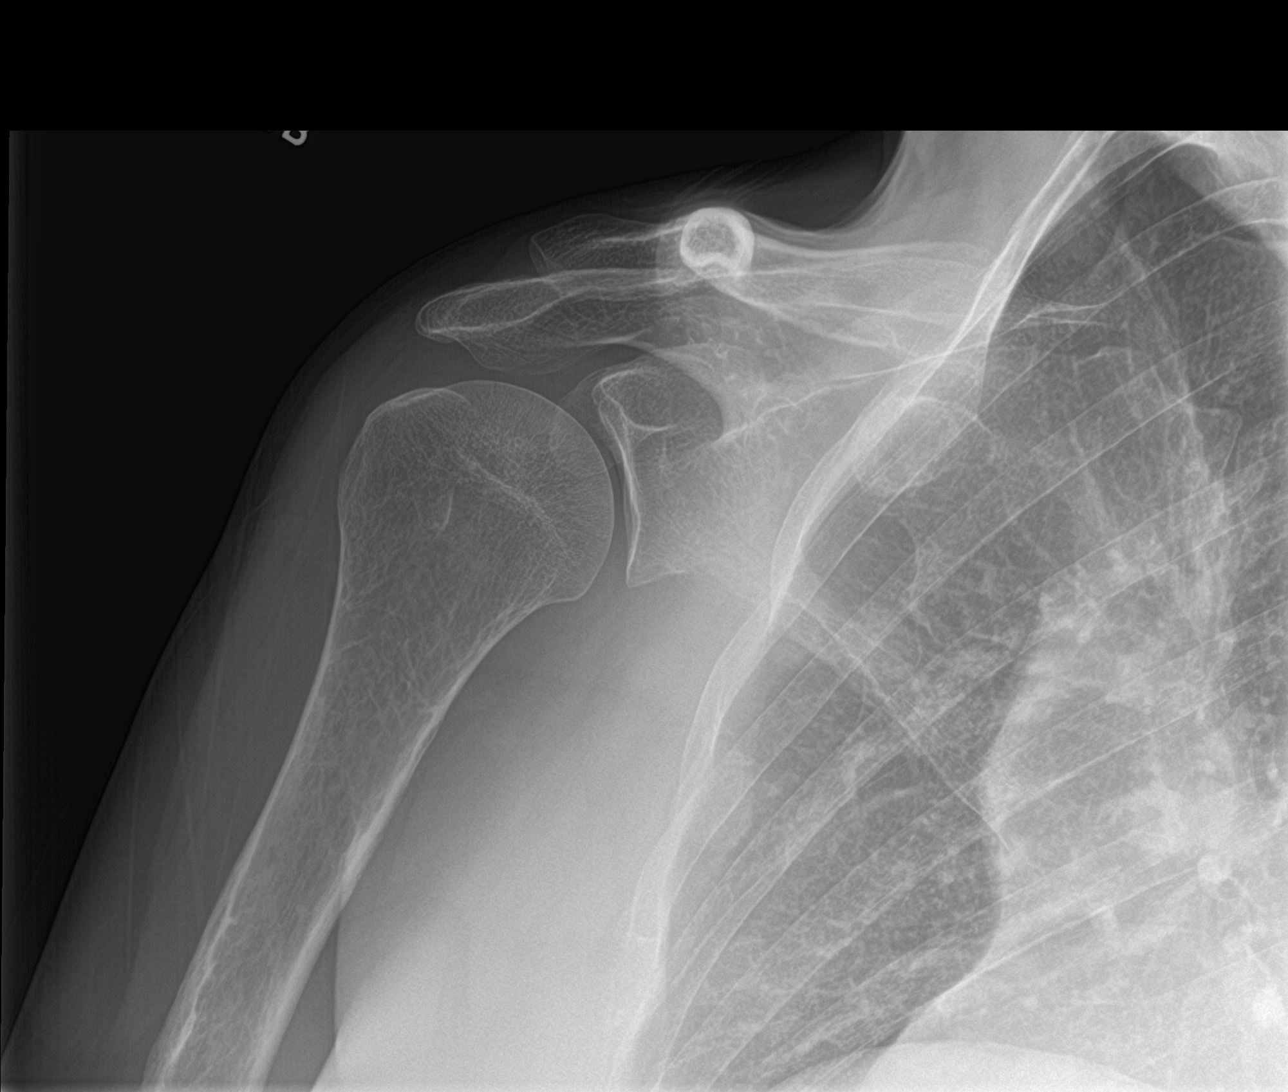

[shoulder y view]
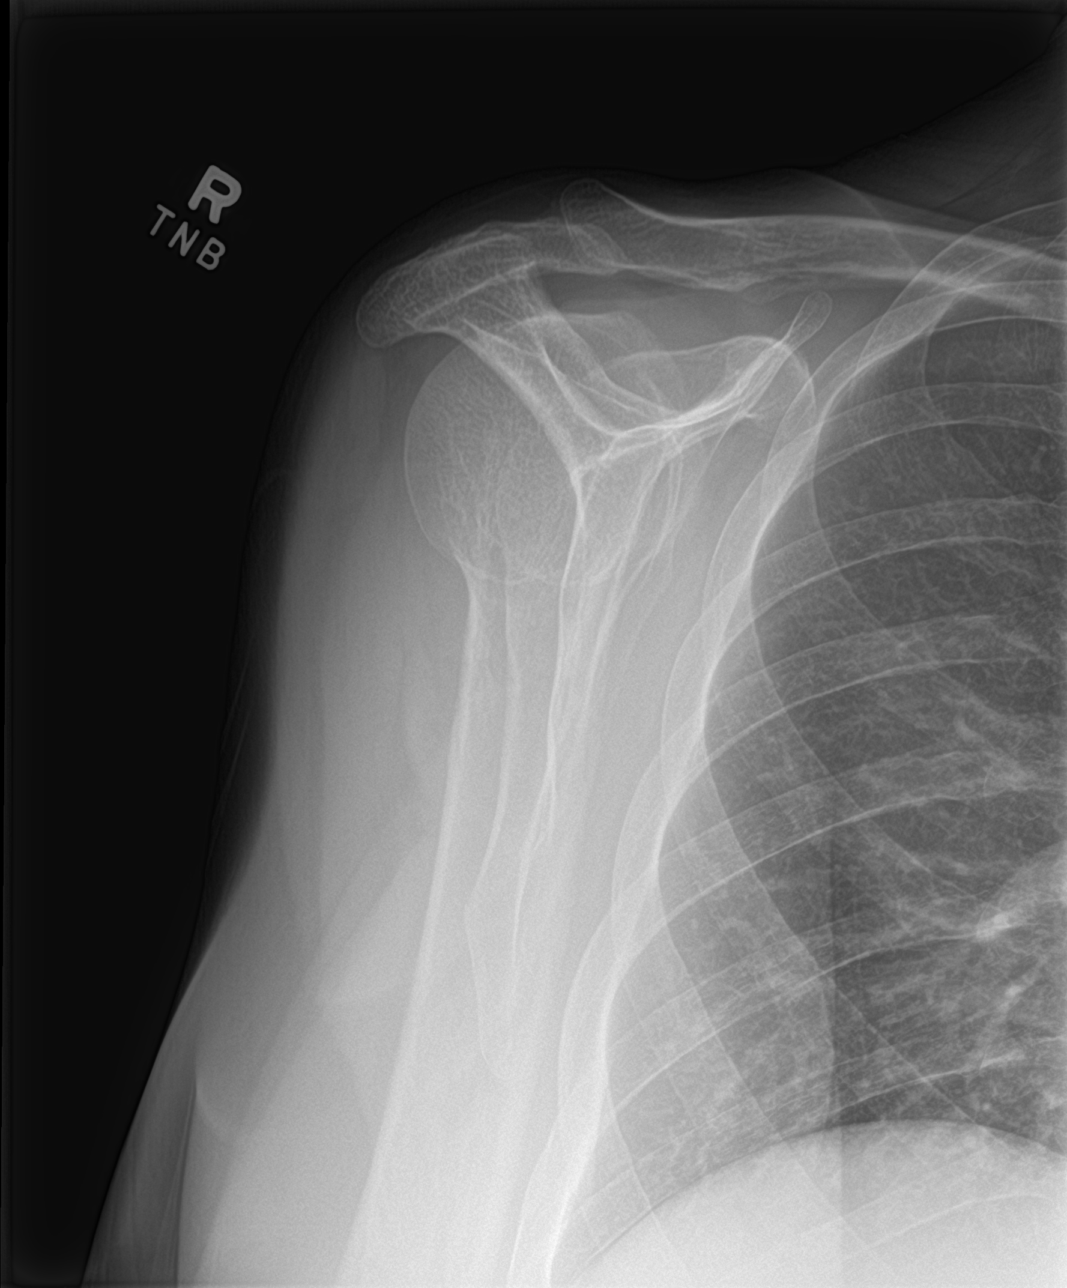

[shoulder axillary]
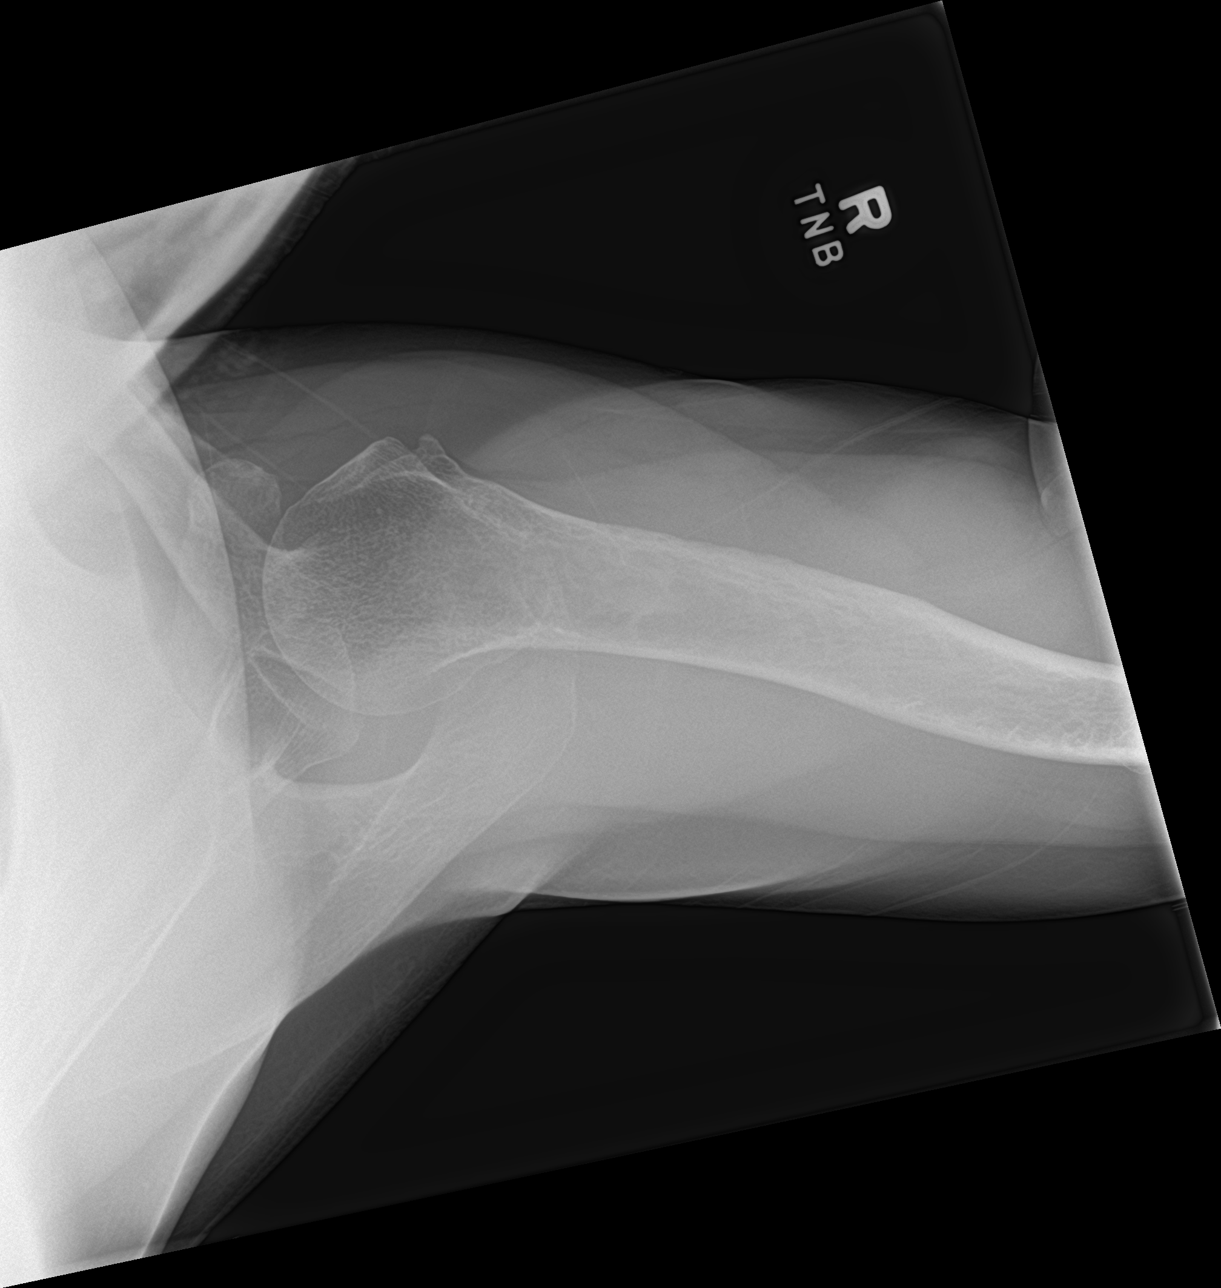

[3 of 3 positions shown; findings below may reference images not displayed]

FINDINGS: There is no evidence of fracture or dislocation. There is no
evidence of arthropathy or other focal bone abnormality. Soft
tissues are unremarkable.
IMPRESSION: Negative.
